# Patient Record
Sex: Female | Born: 1960 | Race: White | Hispanic: No | Marital: Married | Smoking: Never smoker
Health system: Southern US, Community
[De-identification: ages and names within clinical notes are randomized; demographics above are authoritative.]

## PROBLEM LIST (undated history)

## (undated) DIAGNOSIS — R7989 Other specified abnormal findings of blood chemistry: Secondary | ICD-10-CM

## (undated) DIAGNOSIS — R569 Unspecified convulsions: Secondary | ICD-10-CM

## (undated) DIAGNOSIS — R945 Abnormal results of liver function studies: Secondary | ICD-10-CM

## (undated) DIAGNOSIS — F1120 Opioid dependence, uncomplicated: Secondary | ICD-10-CM

## (undated) DIAGNOSIS — F1920 Other psychoactive substance dependence, uncomplicated: Secondary | ICD-10-CM

## (undated) DIAGNOSIS — I10 Essential (primary) hypertension: Secondary | ICD-10-CM

## (undated) DIAGNOSIS — I639 Cerebral infarction, unspecified: Secondary | ICD-10-CM

## (undated) DIAGNOSIS — E785 Hyperlipidemia, unspecified: Secondary | ICD-10-CM

## (undated) HISTORY — DX: Other psychoactive substance dependence, uncomplicated: F19.20

## (undated) HISTORY — DX: Opioid dependence, uncomplicated: F11.20

## (undated) HISTORY — DX: Hyperlipidemia, unspecified: E78.5

## (undated) HISTORY — DX: Essential (primary) hypertension: I10

## (undated) HISTORY — DX: Other specified abnormal findings of blood chemistry: R79.89

## (undated) HISTORY — DX: Abnormal results of liver function studies: R94.5

## (undated) HISTORY — DX: Cerebral infarction, unspecified: I63.9

---

## 1998-12-20 ENCOUNTER — Emergency Department (HOSPITAL_COMMUNITY): Admission: EM | Admit: 1998-12-20 | Discharge: 1998-12-20 | Payer: Self-pay | Admitting: Emergency Medicine

## 2000-10-22 ENCOUNTER — Other Ambulatory Visit: Admission: RE | Admit: 2000-10-22 | Discharge: 2000-10-22 | Payer: Self-pay | Admitting: Obstetrics and Gynecology

## 2000-10-26 ENCOUNTER — Ambulatory Visit (HOSPITAL_COMMUNITY): Admission: RE | Admit: 2000-10-26 | Discharge: 2000-10-26 | Payer: Self-pay | Admitting: Obstetrics and Gynecology

## 2000-10-26 ENCOUNTER — Encounter: Payer: Self-pay | Admitting: Obstetrics and Gynecology

## 2000-12-21 ENCOUNTER — Encounter (INDEPENDENT_AMBULATORY_CARE_PROVIDER_SITE_OTHER): Payer: Self-pay | Admitting: Internal Medicine

## 2000-12-21 ENCOUNTER — Ambulatory Visit (HOSPITAL_COMMUNITY): Admission: RE | Admit: 2000-12-21 | Discharge: 2000-12-21 | Payer: Self-pay | Admitting: Internal Medicine

## 2003-05-20 DIAGNOSIS — I639 Cerebral infarction, unspecified: Secondary | ICD-10-CM

## 2003-05-20 HISTORY — DX: Cerebral infarction, unspecified: I63.9

## 2004-05-29 ENCOUNTER — Emergency Department (HOSPITAL_COMMUNITY): Admission: EM | Admit: 2004-05-29 | Discharge: 2004-05-29 | Payer: Self-pay | Admitting: Emergency Medicine

## 2004-05-29 IMAGING — CT CT HEAD W/O CM
1 series · 16 of 26 positions shown, 20 images · non-contrast
Comparison: None.

CLINICAL DATA: Syncope. Right-sided paresthesias.

HEAD CT WITHOUT CONTRAST
TECHNIQUE: 5mm collimated images were obtained from the base of the skull
through the vertex according to standard protocol without contrast.

[Series 6466: — · axial · 0.45mm/px · z∈[-676,-561]mm · 16 of 26 slices shown, 20 images]
[im 2/26  brain]
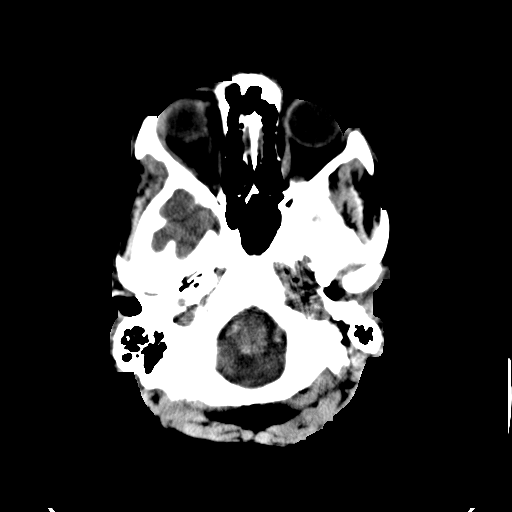
[im 2/26  bone]
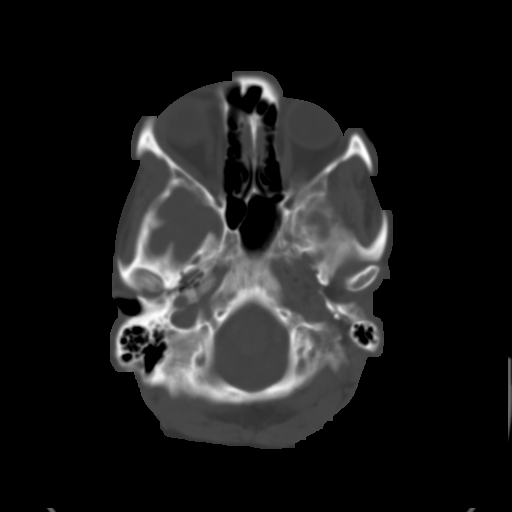
[im 4/26  brain]
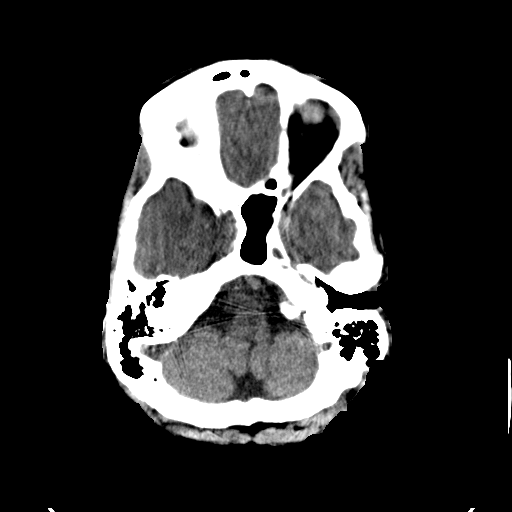
[im 5/26  brain]
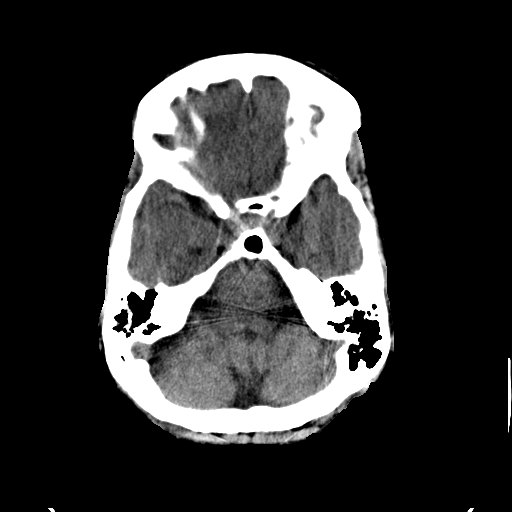
[im 7/26  brain]
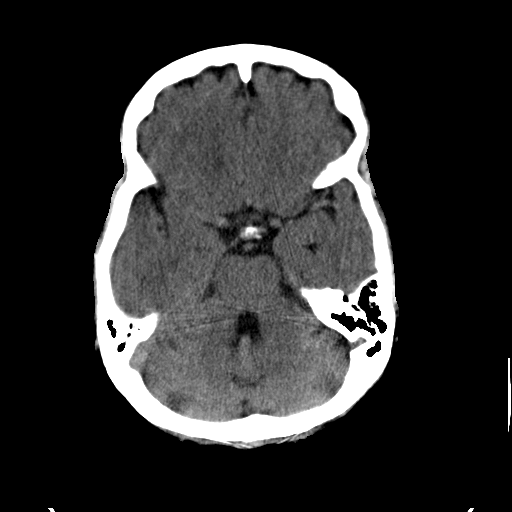
[im 8/26  brain]
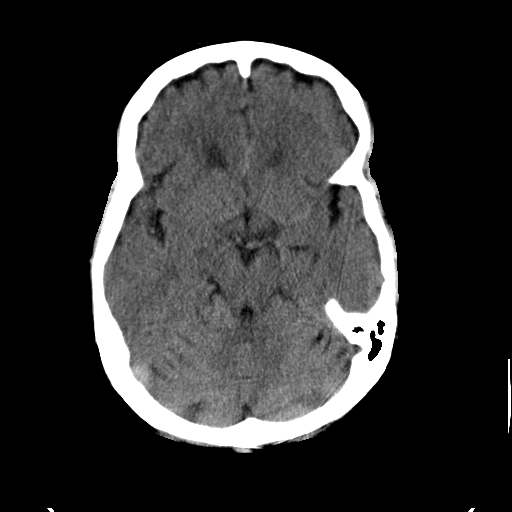
[im 8/26  bone]
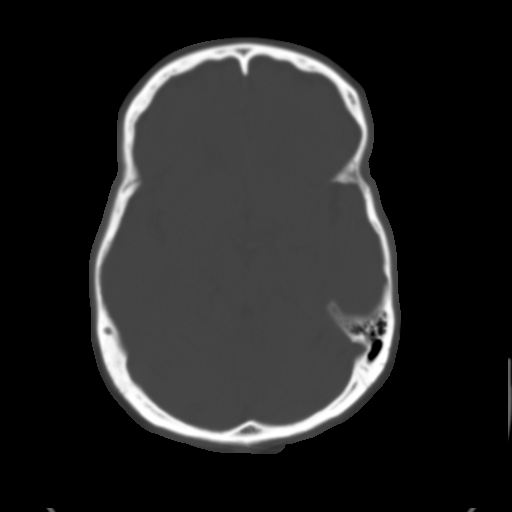
[im 10/26  brain]
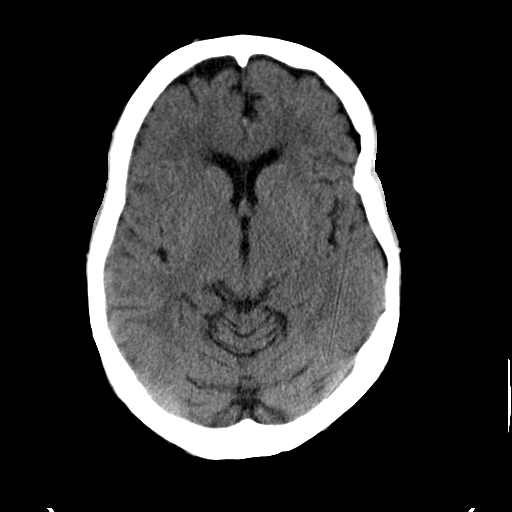
[im 11/26  brain]
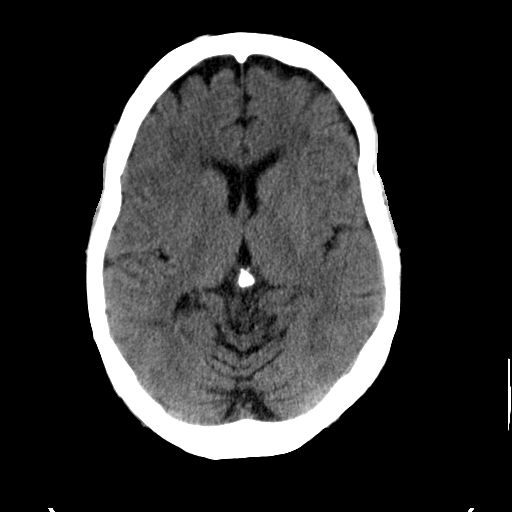
[im 13/26  brain]
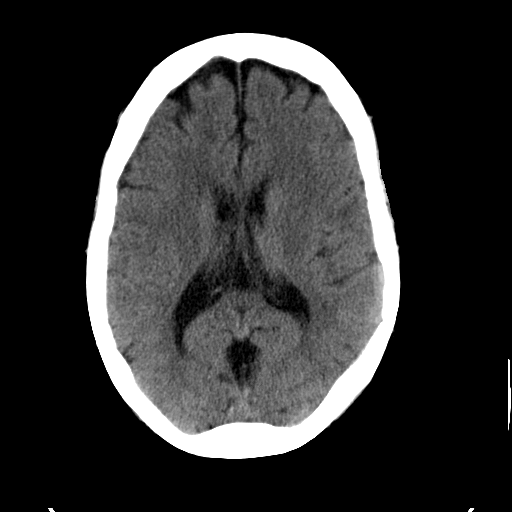
[im 14/26  brain]
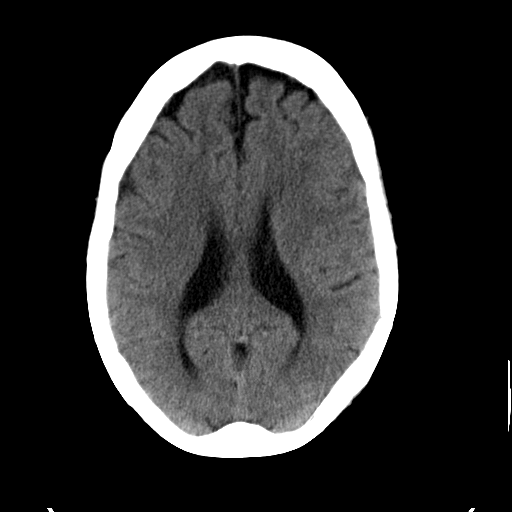
[im 14/26  bone]
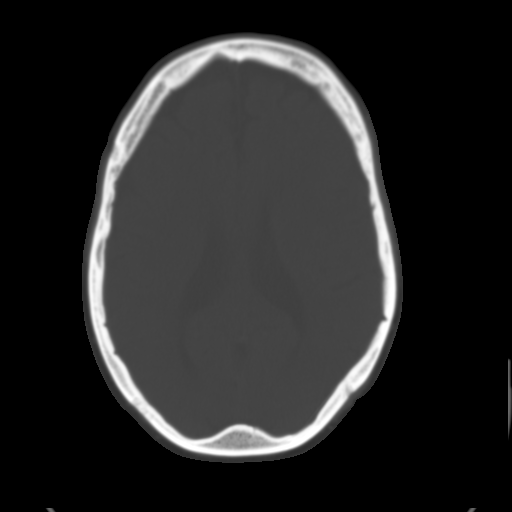
[im 16/26  brain]
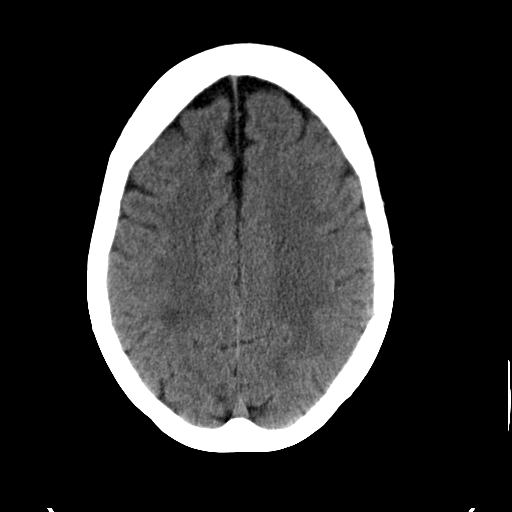
[im 17/26  brain]
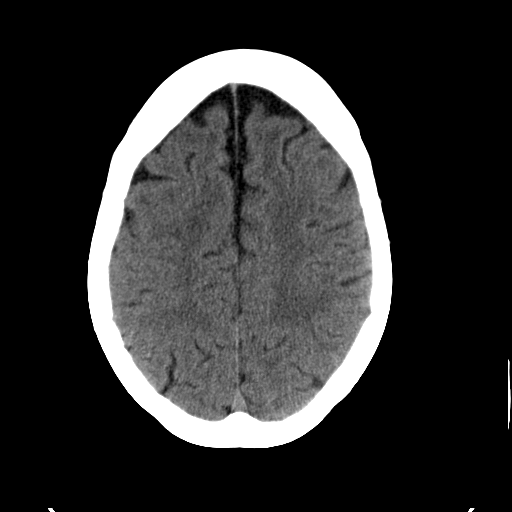
[im 19/26  brain]
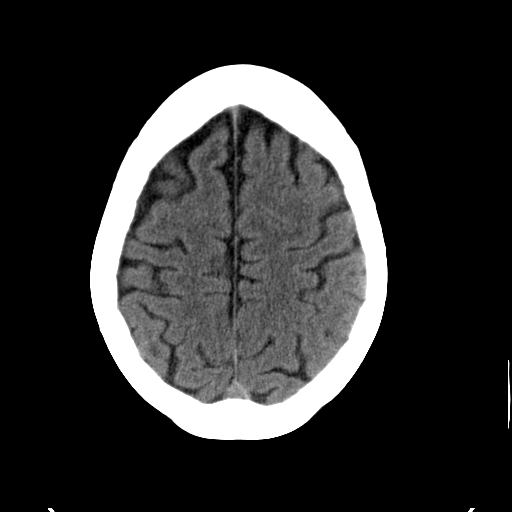
[im 20/26  brain]
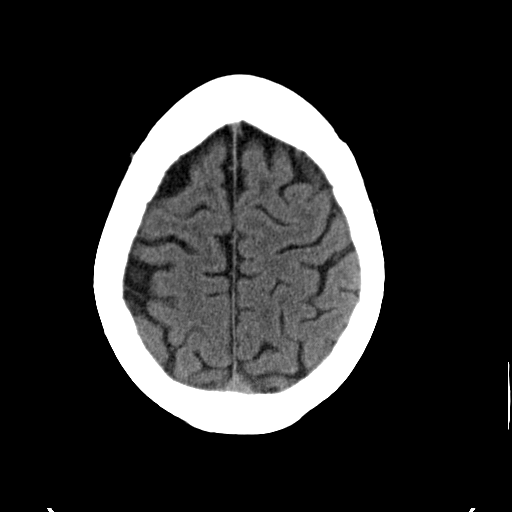
[im 20/26  bone]
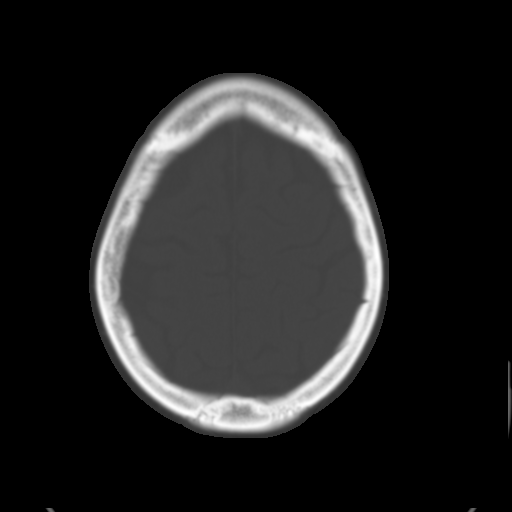
[im 22/26  brain]
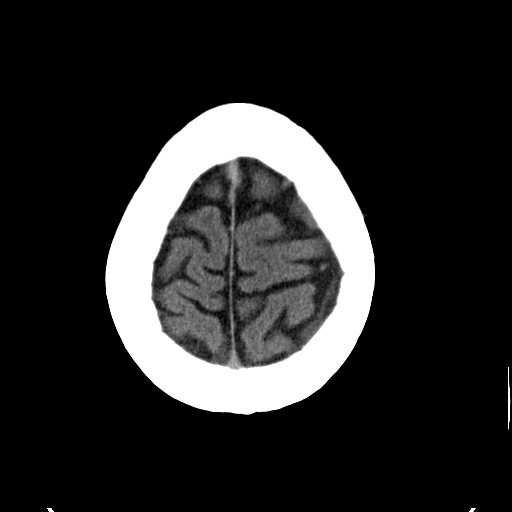
[im 23/26  brain]
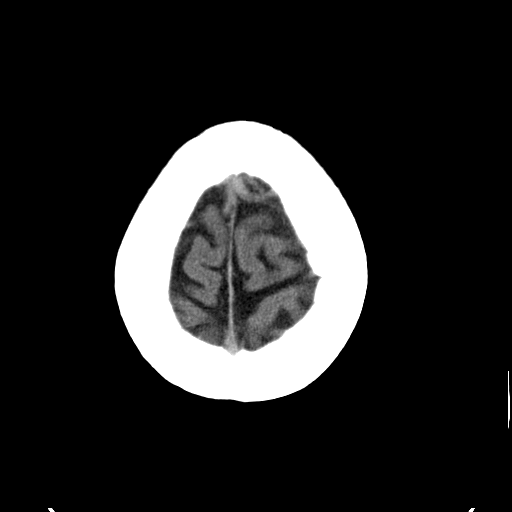
[im 25/26  brain]
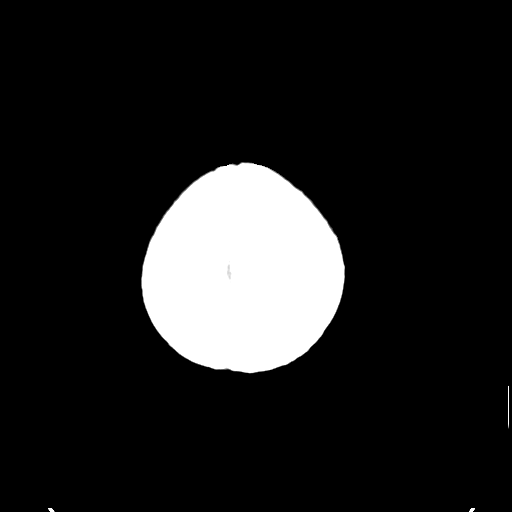

[16 of 26 positions shown; findings below may reference images not displayed]

FINDINGS: Minimally enlarged ventricles and subarachnoid spaces. Small amount
of patchy white matter low density in both cerebral hemispheres, greater on the
right. No intracranial hemorrhage or mass-effect. Unremarkable bones and
included portions of the paranasal sinuses.

IMPRESSION

1. Minimal diffuse cerebral atrophy

2. Mild small vessel white matter ischemic changes in both cerebral hemispheres.

3. No acute abnormality

## 2007-06-03 ENCOUNTER — Other Ambulatory Visit: Admission: RE | Admit: 2007-06-03 | Discharge: 2007-06-03 | Payer: Self-pay | Admitting: Obstetrics and Gynecology

## 2007-07-01 ENCOUNTER — Ambulatory Visit (HOSPITAL_COMMUNITY): Admission: RE | Admit: 2007-07-01 | Discharge: 2007-07-01 | Payer: Self-pay | Admitting: Obstetrics and Gynecology

## 2007-12-21 ENCOUNTER — Ambulatory Visit (HOSPITAL_COMMUNITY): Admission: RE | Admit: 2007-12-21 | Discharge: 2007-12-21 | Payer: Self-pay | Admitting: Obstetrics & Gynecology

## 2007-12-21 IMAGING — US US ABDOMEN COMPLETE
1 series · 14 of 25 positions shown · non-contrast
Comparison: None

CLINICAL DATA: Elevated LFTs, right upper quadrant tenderness,
hepatic enlargement

ABDOMEN ULTRASOUND
TECHNIQUE: Complete abdominal ultrasound examination was performed
including evaluation of the liver, gallbladder, bile ducts,
pancreas, kidneys, spleen, IVC, and abdominal aorta.

[Series 1: unknown · 0.32mm/px · 14 of 61 slices shown]
[im 1/61]
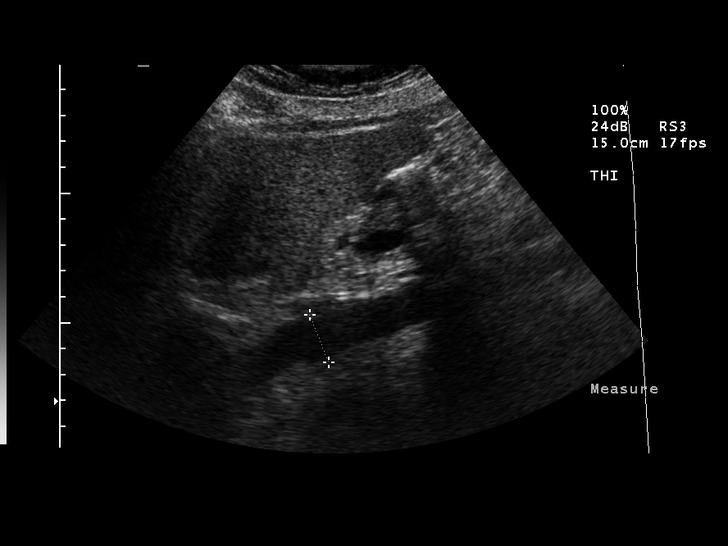
[im 6/61]
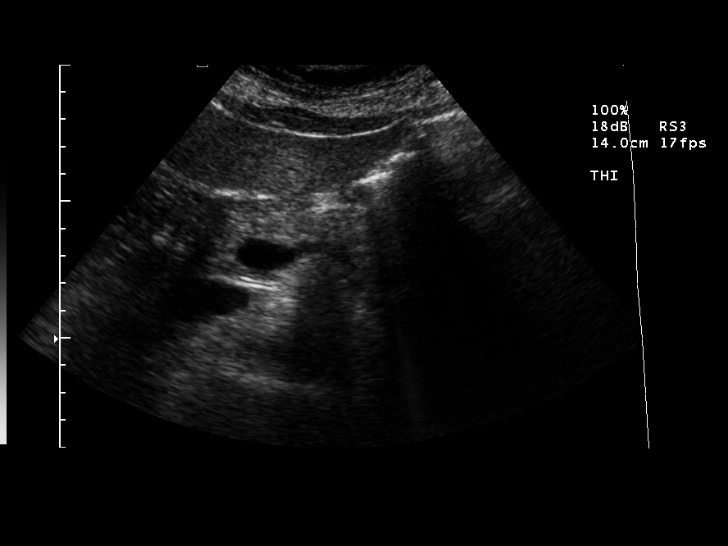
[im 11/61]
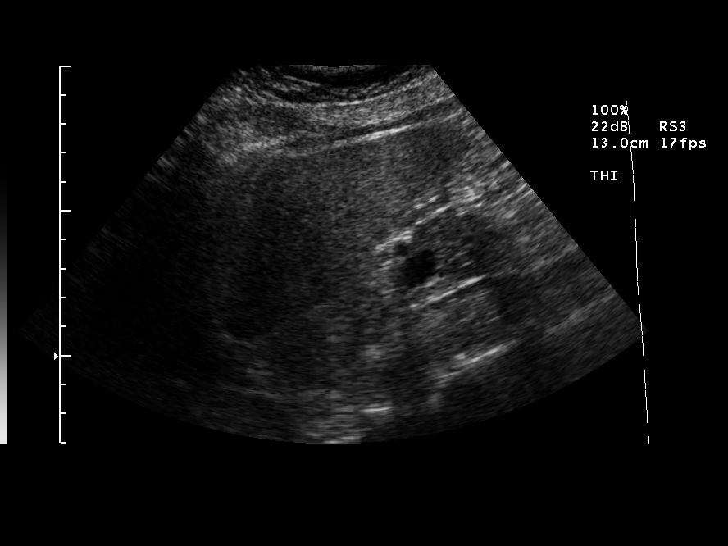
[im 16/61]
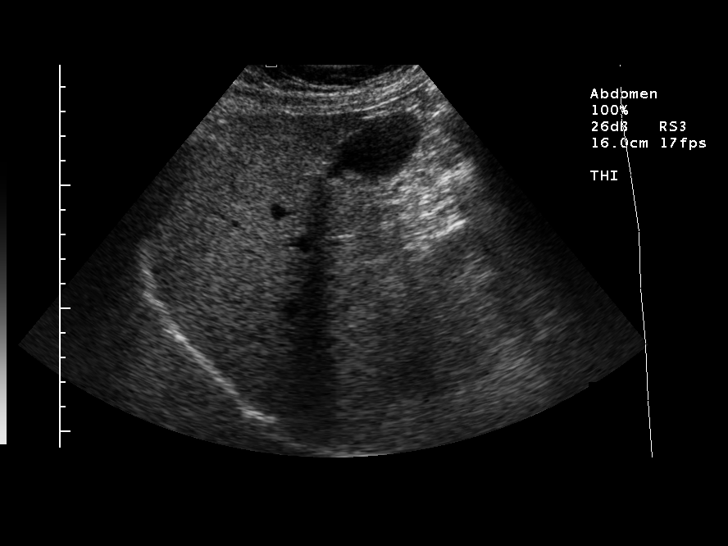
[im 21/61]
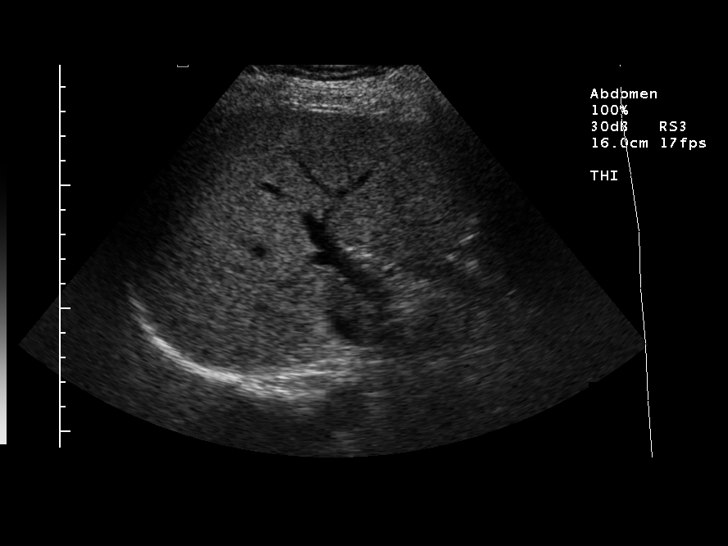
[im 23/61]
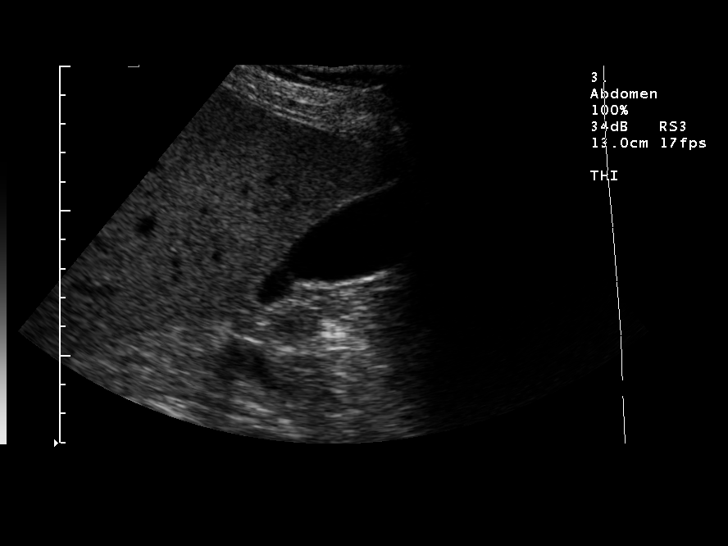
[im 28/61]
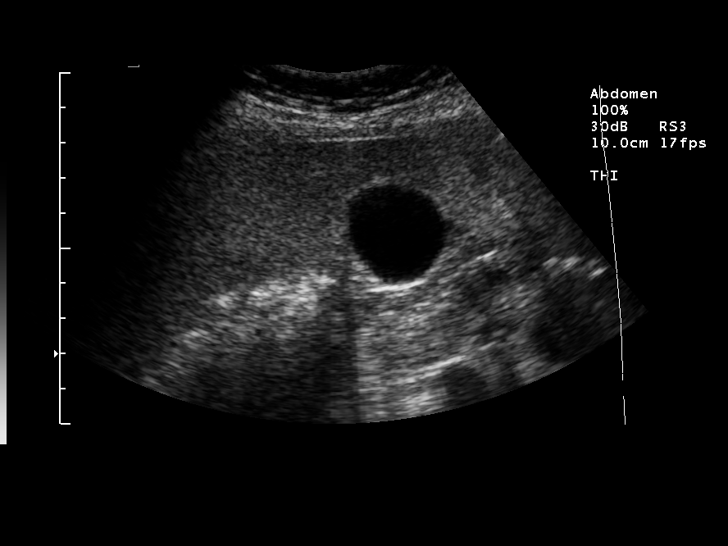
[im 33/61]
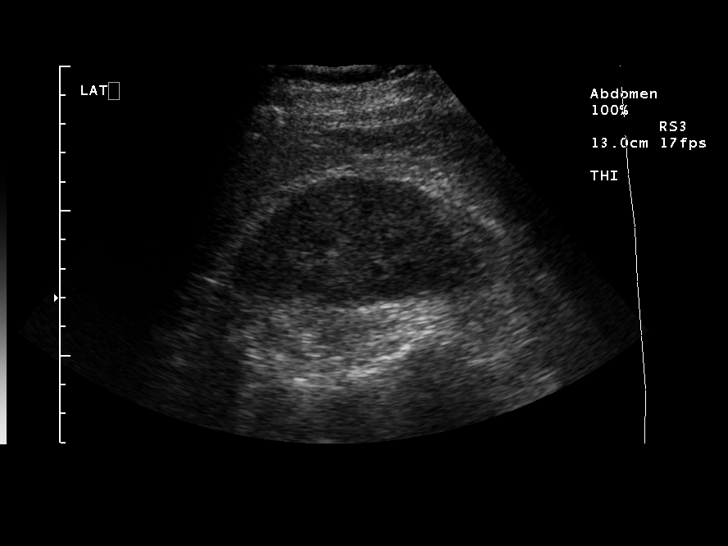
[im 38/61]
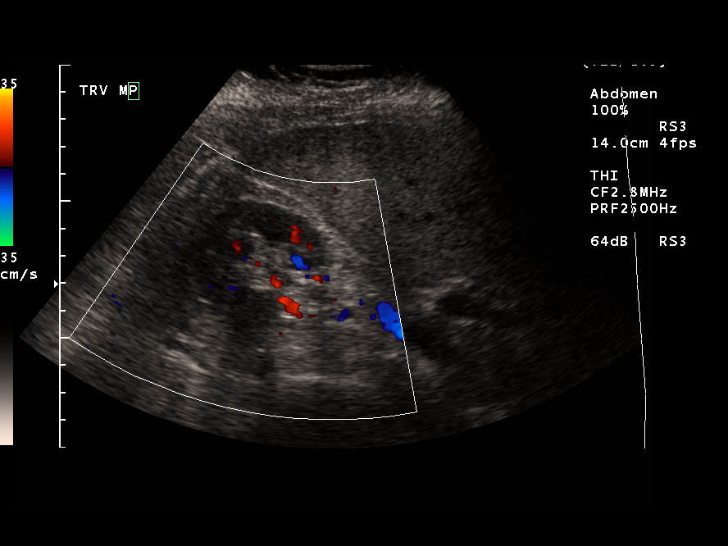
[im 41/61]
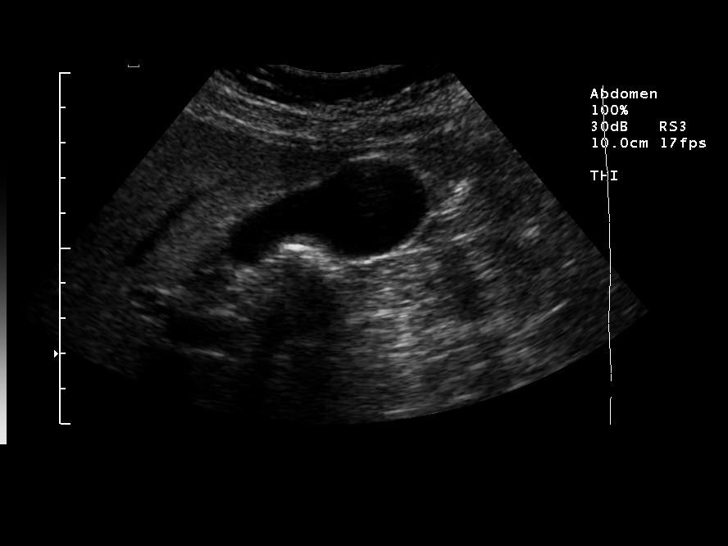
[im 46/61]
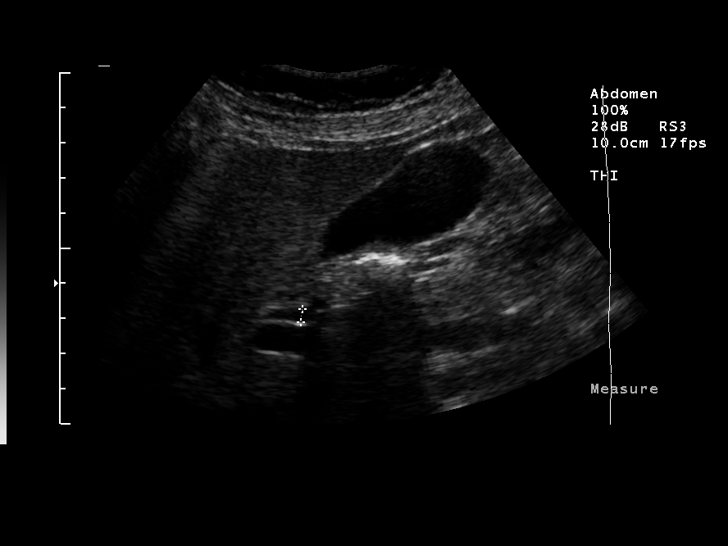
[im 51/61]
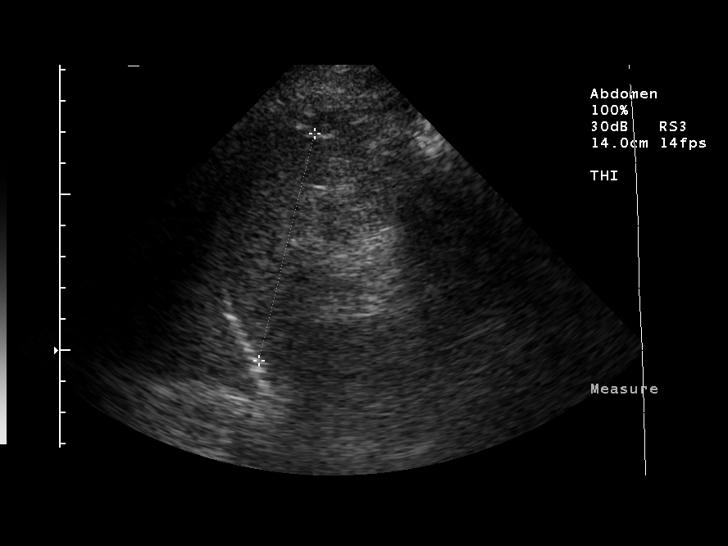
[im 56/61]
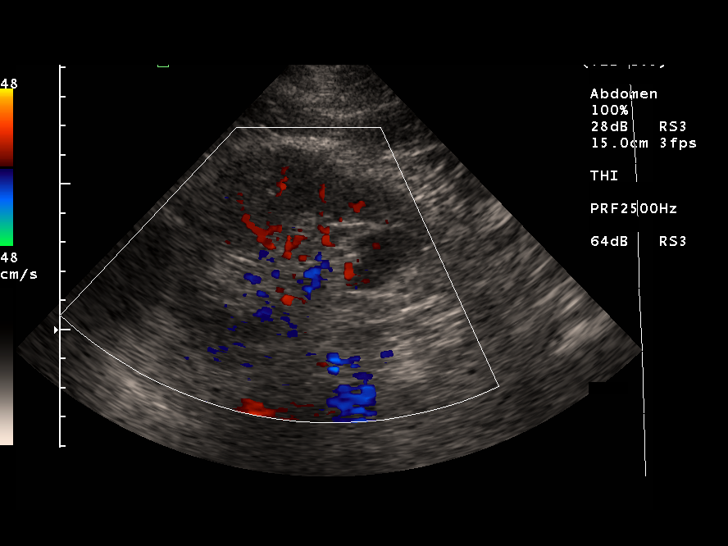
[im 61/61]
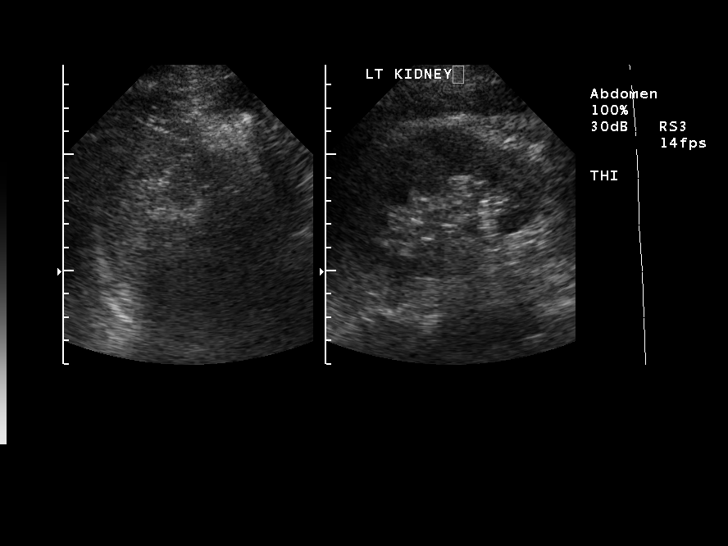

[14 of 25 positions shown; findings below may reference images not displayed]

FINDINGS: Gallbladder normally distended without stones or wall thickening.
No sonographic Murphy sign.
Common bile duct normal caliber, 4 mm diameter.
Minimally echogenic liver, question mild fatty infiltration, though
this can be seen with cirrhosis and certain infiltrative disorders.
No focal hepatic mass or nodularity, nor evidence of hepatic
enlargement.
Pancreas and spleen normal appearance, spleen 8.7 cm length.
Kidneys normal size and morphology, 10.2 cm length right and 9.9 cm
length left.
Aorta and IVC normal.
No free fluid.
IMPRESSION: Probable mild fatty infiltration of liver.
No acute upper abdominal abnormalities.

## 2008-01-06 ENCOUNTER — Ambulatory Visit: Payer: Self-pay | Admitting: Gastroenterology

## 2008-06-12 ENCOUNTER — Other Ambulatory Visit: Admission: RE | Admit: 2008-06-12 | Discharge: 2008-06-12 | Payer: Self-pay | Admitting: Obstetrics and Gynecology

## 2008-06-22 ENCOUNTER — Ambulatory Visit: Payer: Self-pay | Admitting: Orthopedic Surgery

## 2008-06-22 DIAGNOSIS — M4800 Spinal stenosis, site unspecified: Secondary | ICD-10-CM

## 2008-06-22 DIAGNOSIS — M542 Cervicalgia: Secondary | ICD-10-CM

## 2008-06-22 DIAGNOSIS — M25519 Pain in unspecified shoulder: Secondary | ICD-10-CM | POA: Insufficient documentation

## 2008-06-22 DIAGNOSIS — S139XXA Sprain of joints and ligaments of unspecified parts of neck, initial encounter: Secondary | ICD-10-CM

## 2008-06-22 DIAGNOSIS — M479 Spondylosis, unspecified: Secondary | ICD-10-CM | POA: Insufficient documentation

## 2008-06-30 ENCOUNTER — Telehealth: Payer: Self-pay | Admitting: Orthopedic Surgery

## 2008-07-03 DIAGNOSIS — E7800 Pure hypercholesterolemia, unspecified: Secondary | ICD-10-CM | POA: Insufficient documentation

## 2008-07-03 DIAGNOSIS — I10 Essential (primary) hypertension: Secondary | ICD-10-CM | POA: Insufficient documentation

## 2008-07-03 DIAGNOSIS — I6350 Cerebral infarction due to unspecified occlusion or stenosis of unspecified cerebral artery: Secondary | ICD-10-CM | POA: Insufficient documentation

## 2008-07-03 DIAGNOSIS — R1310 Dysphagia, unspecified: Secondary | ICD-10-CM | POA: Insufficient documentation

## 2008-07-03 DIAGNOSIS — F1010 Alcohol abuse, uncomplicated: Secondary | ICD-10-CM | POA: Insufficient documentation

## 2008-07-03 DIAGNOSIS — R7309 Other abnormal glucose: Secondary | ICD-10-CM | POA: Insufficient documentation

## 2008-07-04 ENCOUNTER — Ambulatory Visit: Payer: Self-pay | Admitting: Gastroenterology

## 2008-07-05 ENCOUNTER — Telehealth: Payer: Self-pay | Admitting: Orthopedic Surgery

## 2008-07-18 ENCOUNTER — Telehealth: Payer: Self-pay | Admitting: Orthopedic Surgery

## 2008-07-20 ENCOUNTER — Encounter: Payer: Self-pay | Admitting: Orthopedic Surgery

## 2008-07-20 ENCOUNTER — Telehealth: Payer: Self-pay | Admitting: Orthopedic Surgery

## 2008-07-24 ENCOUNTER — Ambulatory Visit (HOSPITAL_COMMUNITY): Admission: RE | Admit: 2008-07-24 | Discharge: 2008-07-24 | Payer: Self-pay | Admitting: Obstetrics and Gynecology

## 2008-08-01 ENCOUNTER — Telehealth: Payer: Self-pay | Admitting: Orthopedic Surgery

## 2008-08-02 ENCOUNTER — Ambulatory Visit (HOSPITAL_COMMUNITY): Admission: RE | Admit: 2008-08-02 | Discharge: 2008-08-02 | Payer: Self-pay | Admitting: Orthopedic Surgery

## 2008-08-02 IMAGING — CT CT CERVICAL SPINE W/O CM
3 of 4 series · 13 of 33 positions shown, 16 images · non-contrast
Comparison: None

CLINICAL DATA: Neck pain, spinal stenosis

CT CERVICAL SPINE WITHOUT CONTRAST
TECHNIQUE: Multidetector CT imaging of the cervical spine was
performed. Multiplanar CT image reconstructions were also
generated.

[Series 2: cervical 2.0 b31s · axial · 0.26mm/px · z∈[+124,+232]mm · 5 of 110 slices shown, 7 images]
[im 19/110  soft-tissue]
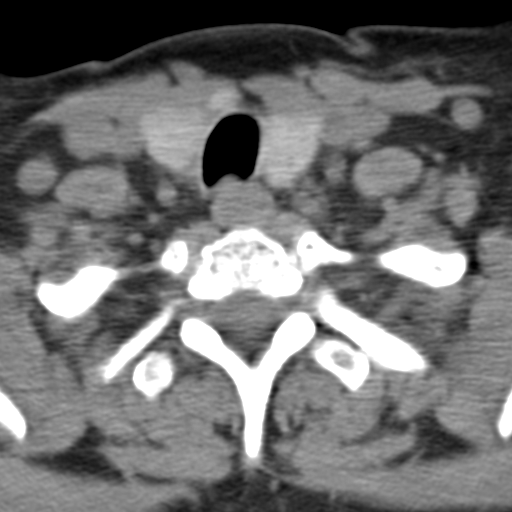
[im 19/110  bone]
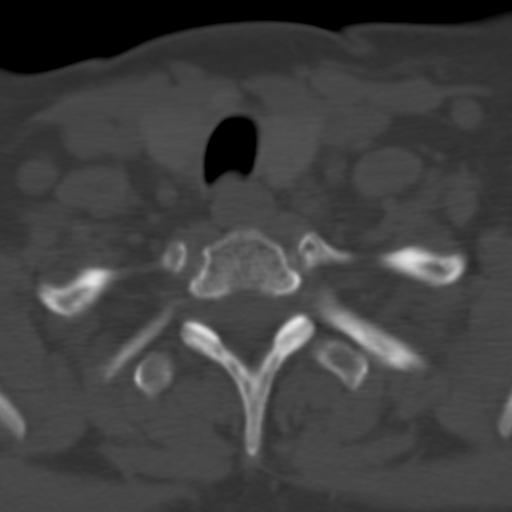
[im 37/110  bone]
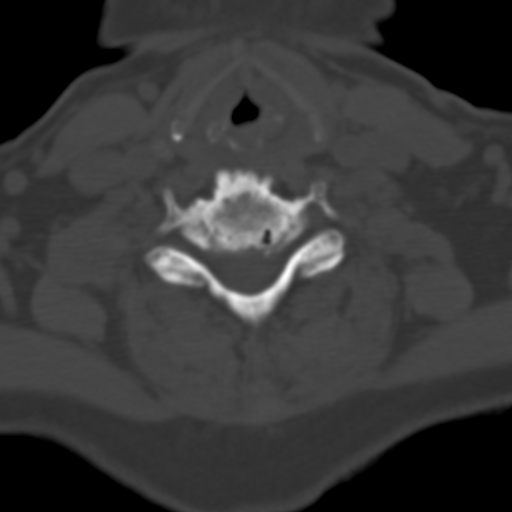
[im 55/110  bone]
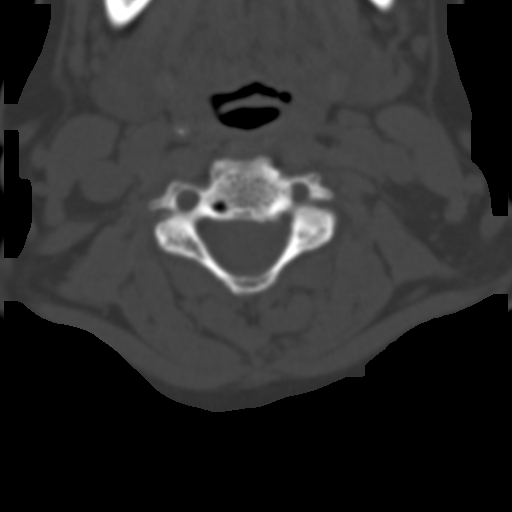
[im 73/110  bone]
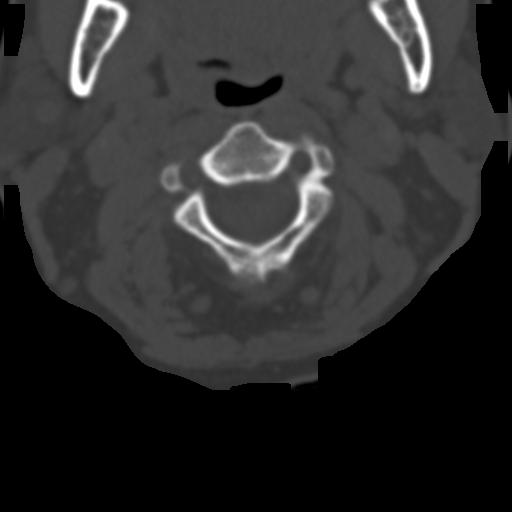
[im 91/110  soft-tissue]
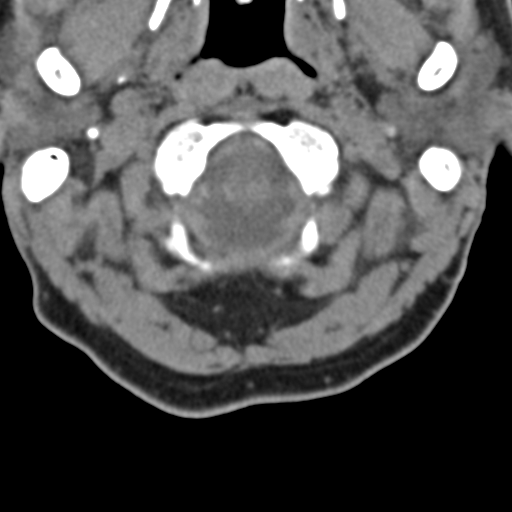
[im 91/110  bone]
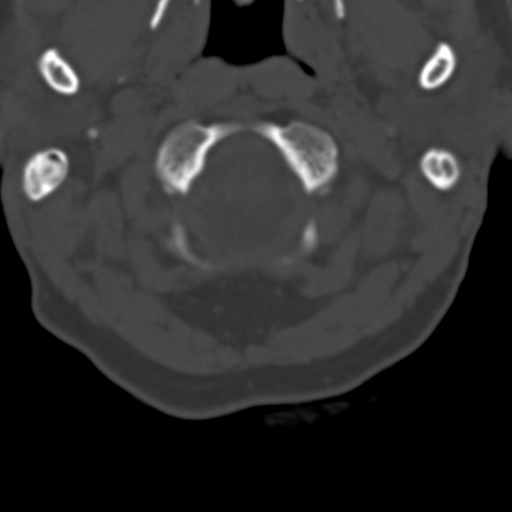

[Series 4: cervical 2.0 spo cor · coronal · 0.25mm/px · 3 of 68 slices shown]
[im 14/68  bone]
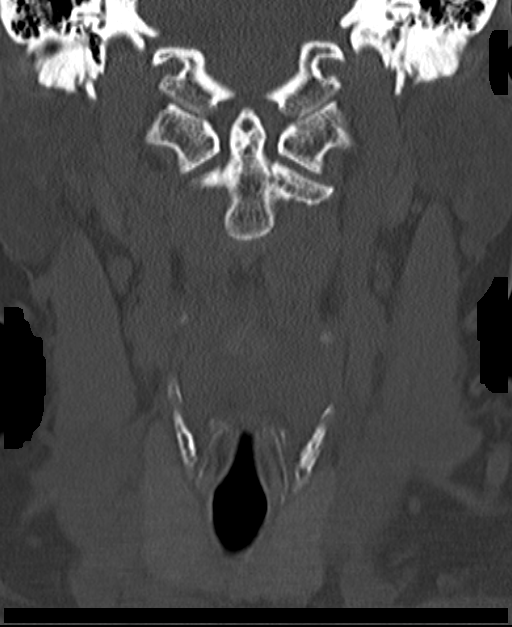
[im 27/68  bone]
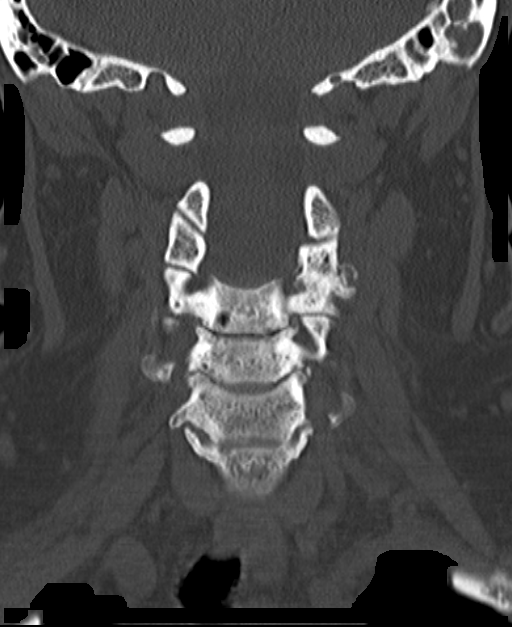
[im 41/68  bone]
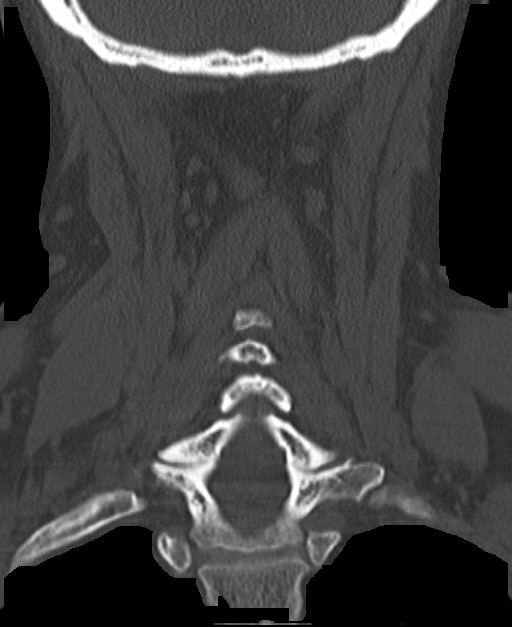

[Series 5: cervical 2.0 spo sag · sagittal · 0.26mm/px · 5 of 55 slices shown, 6 images]
[im 19/55  bone]
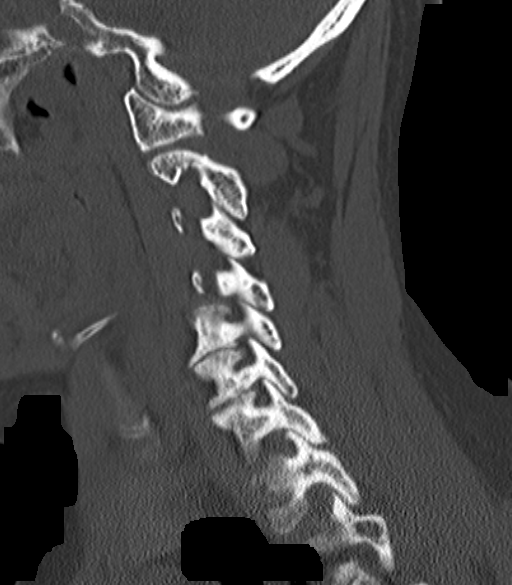
[im 23/55  bone]
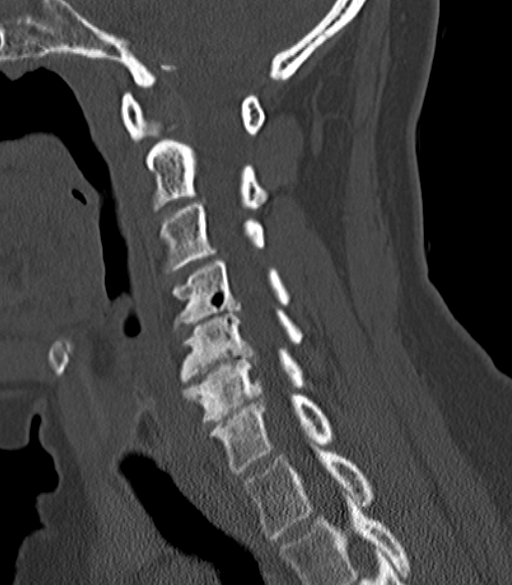
[im 28/55  soft-tissue]
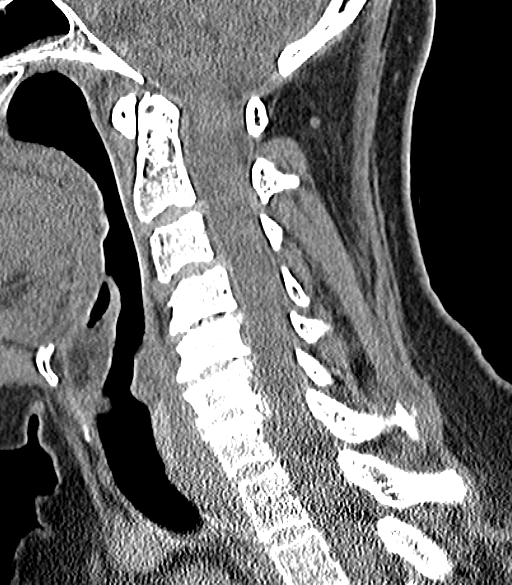
[im 28/55  bone]
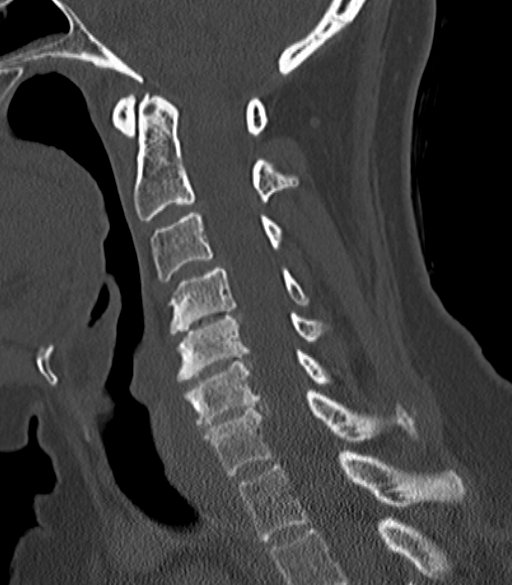
[im 32/55  bone]
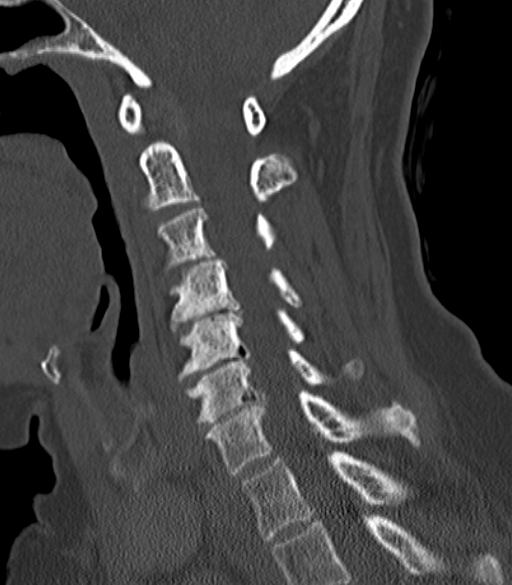
[im 37/55  bone]
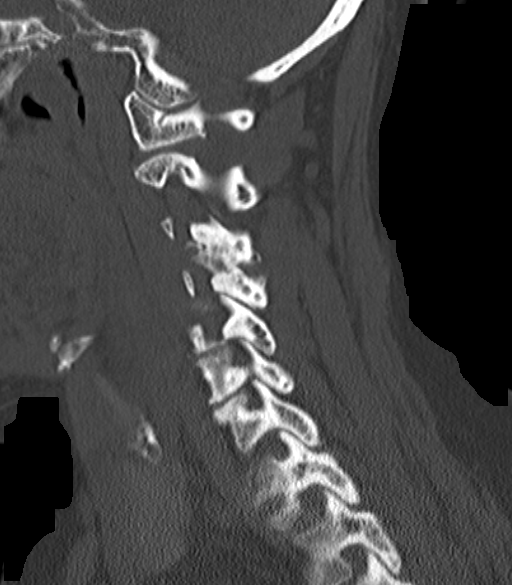

[13 of 33 positions shown; findings below may reference images not displayed]

FINDINGS: Normal alignment.  No prevertebral soft tissue swelling.

C2-3:  Small central bulge.  No foraminal or central canal
stenosis.

C3-4:  Moderately advanced left facet degenerative hypertrophy and
spurring resulting in early left-sided foraminal encroachment.  The
interspace is unremarkable.

C4-5:  Moderate narrowing of the interspace.  Discogenic sclerosis
in the adjacent vertebral bodies.  Vacuum phenomenon in the
interspace extending into the C4 vertebral body on the right.  Mild
circumferential disc bulge with associated endplate spurring.  This
results in asymmetric neural foraminal encroachment, left greater
than right.  No central canal stenosis.

C5-6:  Narrowing of the interspace with circumferential disc bulge
and associated endplate spurring.  Uncovertebral hypertrophy
results in neural foraminal narrowing, right greater than left.
There is mild narrowing of the central canal and there may be some
flattening of the anterior aspect of the cord, evaluation limited
without intrathecal contrast.

C6-7:  Moderate narrowing of the interspace with early vacuum
phenomenon.  Circumferential disc bulge with associated endplate
spurs.  There may be a superimposed  central   noncalcified
protrusion.  The effect  upon the cervical cord is not evaluated.
Mild bilateral uncovertebral spurring results in early foraminal
encroachment, left greater than right.  No definite central canal
stenosis.

C7-C1:  The interspace is unremarkable.  There is early facet
degenerative spurring, right greater than left, without neural
foraminal or central canal stenosis.
IMPRESSION: 1.  Left facet degenerative hypertrophy C3-4 resulting in foraminal
stenosis.
2.  Degenerative disc and uncovertebral disease C4-5, C5-6, and C6-
7 as enumerated above.

## 2008-08-07 ENCOUNTER — Ambulatory Visit: Payer: Self-pay | Admitting: Orthopedic Surgery

## 2008-08-09 ENCOUNTER — Encounter (HOSPITAL_COMMUNITY): Admission: RE | Admit: 2008-08-09 | Discharge: 2008-09-08 | Payer: Self-pay | Admitting: Orthopedic Surgery

## 2008-08-09 ENCOUNTER — Encounter: Payer: Self-pay | Admitting: Gastroenterology

## 2008-08-09 ENCOUNTER — Encounter: Payer: Self-pay | Admitting: Orthopedic Surgery

## 2008-08-10 LAB — CONVERTED CEMR LAB
Alkaline Phosphatase: 79 units/L (ref 39–117)
Bilirubin, Direct: 0.1 mg/dL (ref 0.0–0.3)
Indirect Bilirubin: 0.5 mg/dL (ref 0.0–0.9)
Total Bilirubin: 0.6 mg/dL (ref 0.3–1.2)

## 2008-08-11 ENCOUNTER — Encounter (INDEPENDENT_AMBULATORY_CARE_PROVIDER_SITE_OTHER): Payer: Self-pay | Admitting: *Deleted

## 2008-08-14 ENCOUNTER — Telehealth: Payer: Self-pay | Admitting: Orthopedic Surgery

## 2008-09-27 ENCOUNTER — Telehealth: Payer: Self-pay | Admitting: Orthopedic Surgery

## 2008-11-07 ENCOUNTER — Encounter: Payer: Self-pay | Admitting: Orthopedic Surgery

## 2008-12-20 ENCOUNTER — Encounter: Payer: Self-pay | Admitting: Gastroenterology

## 2009-09-21 ENCOUNTER — Other Ambulatory Visit: Admission: RE | Admit: 2009-09-21 | Discharge: 2009-09-21 | Payer: Self-pay | Admitting: Obstetrics and Gynecology

## 2009-10-23 ENCOUNTER — Ambulatory Visit: Payer: Self-pay | Admitting: Gastroenterology

## 2009-10-23 DIAGNOSIS — R740 Nonspecific elevation of levels of transaminase and lactic acid dehydrogenase [LDH]: Secondary | ICD-10-CM

## 2009-10-23 DIAGNOSIS — R7401 Elevation of levels of liver transaminase levels: Secondary | ICD-10-CM | POA: Insufficient documentation

## 2009-10-24 ENCOUNTER — Encounter: Payer: Self-pay | Admitting: Gastroenterology

## 2009-10-29 ENCOUNTER — Ambulatory Visit (HOSPITAL_COMMUNITY): Admission: RE | Admit: 2009-10-29 | Discharge: 2009-10-29 | Payer: Self-pay | Admitting: Gastroenterology

## 2009-10-29 IMAGING — CT CT ABD-PELV W/ CM
2 of 5 series · 15 of 46 positions shown, 17 images · IV contrast (Omnipaque 300)
Comparison: None

CLINICAL DATA: Elevated LFTs, right upper quadrant pain,
hypertension

CT ABDOMEN AND PELVIS WITH CONTRAST
TECHNIQUE: Multidetector CT imaging of the abdomen and pelvis was
performed following the standard protocol during bolus
administration of intravenous contrast. Breast shield utilized.
Sagittal and coronal MPR images reconstructed from axial data set.
Contrast: 100 ml [XY] IV; oral contrast not utilized

[Series 3: (person_name) thins (id) · axial · 0.59mm/px · z∈[-503,-101]mm · 12 of 298 slices shown, 14 images]
[im 15/298  soft-tissue]
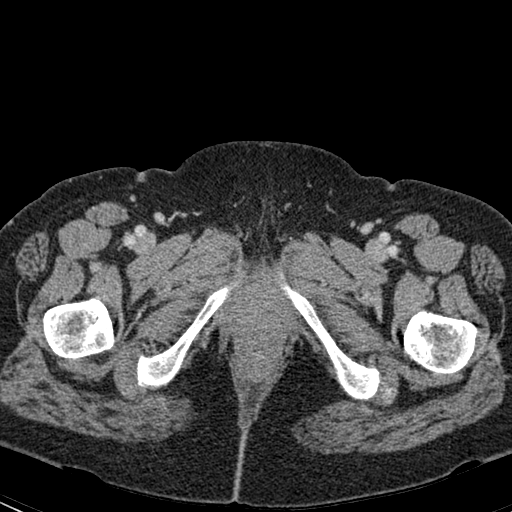
[im 15/298  bone]
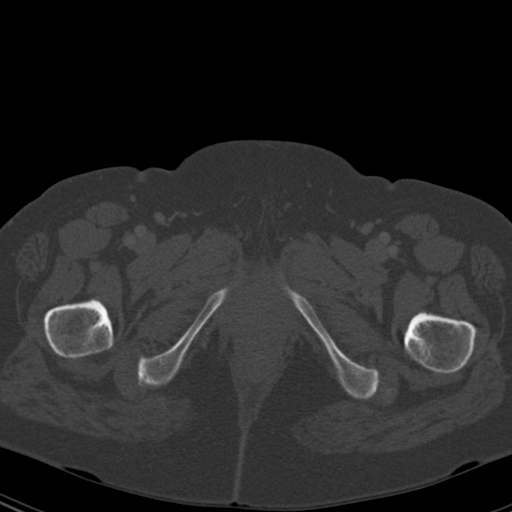
[im 43/298  soft-tissue]
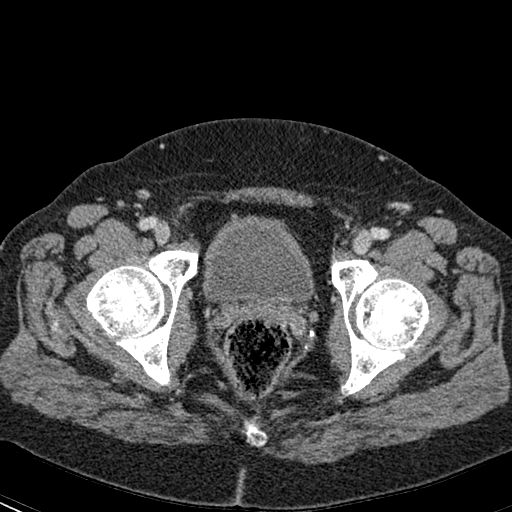
[im 71/298  soft-tissue]
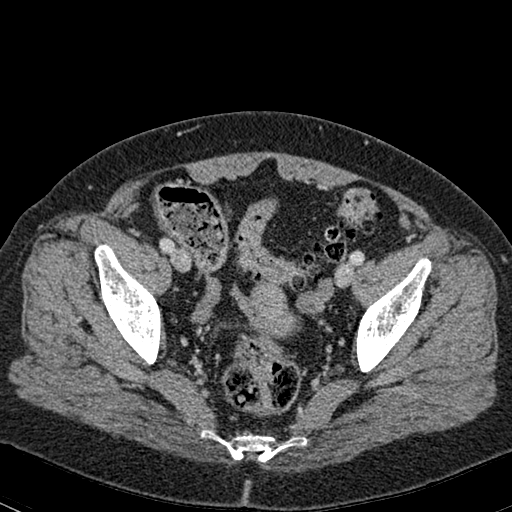
[im 85/298  soft-tissue]
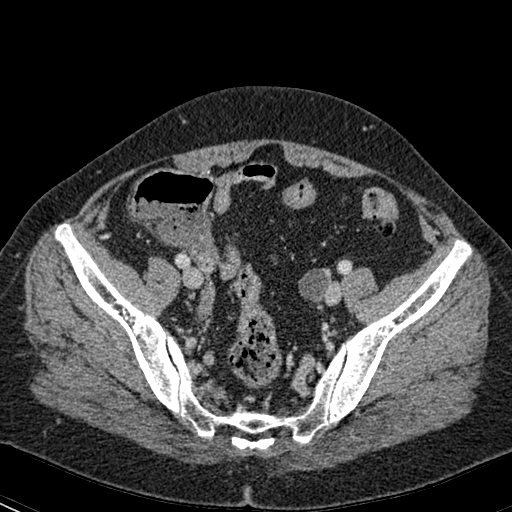
[im 114/298  soft-tissue]
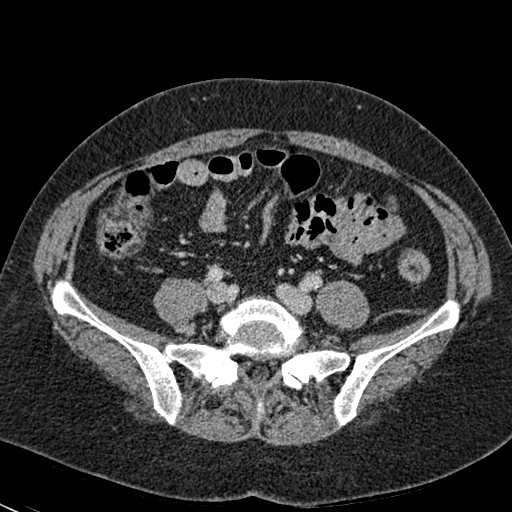
[im 142/298  soft-tissue]
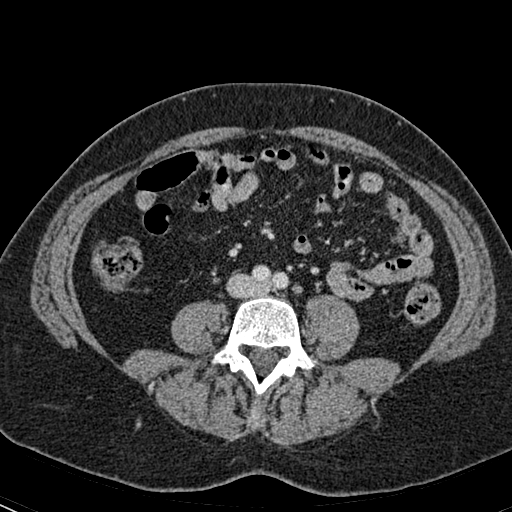
[im 156/298  soft-tissue]
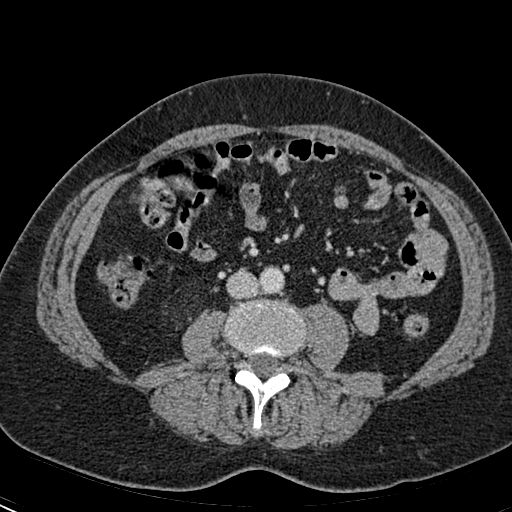
[im 184/298  soft-tissue]
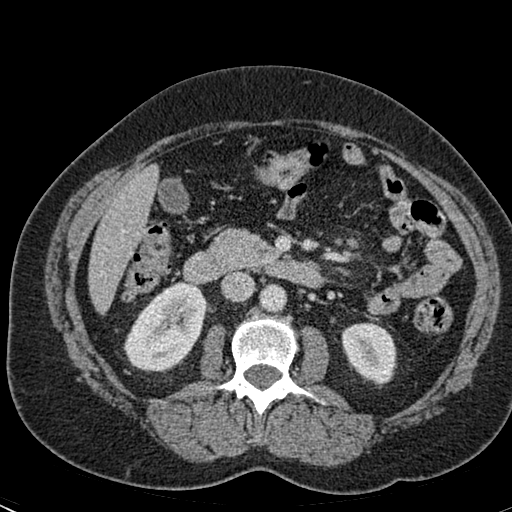
[im 213/298  soft-tissue]
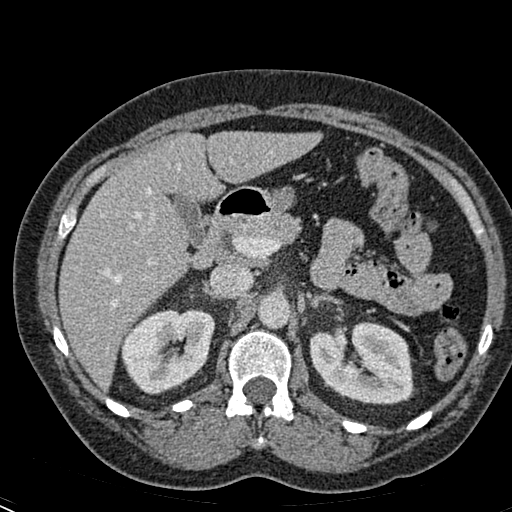
[im 213/298  bone]
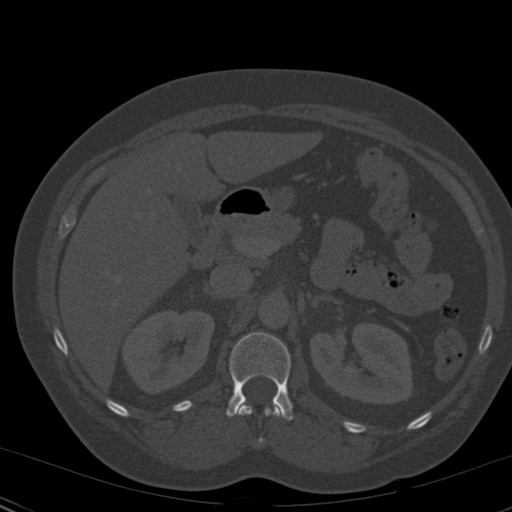
[im 227/298  soft-tissue]
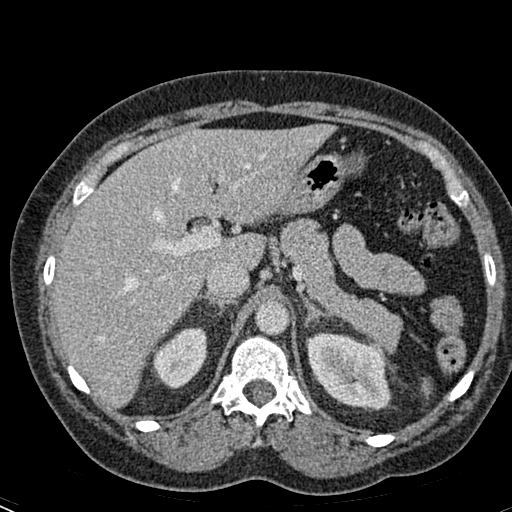
[im 255/298  soft-tissue]
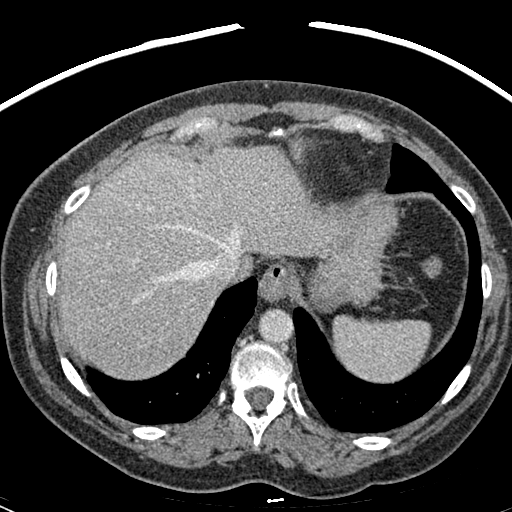
[im 283/298  soft-tissue]
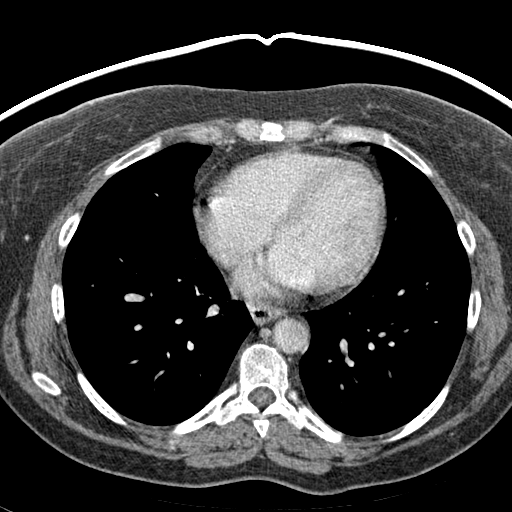

[Series 8: abd_pel_with 3.0 spo cor · coronal · 0.59mm/px · 3 of 71 slices shown]
[im 24/71  soft-tissue]
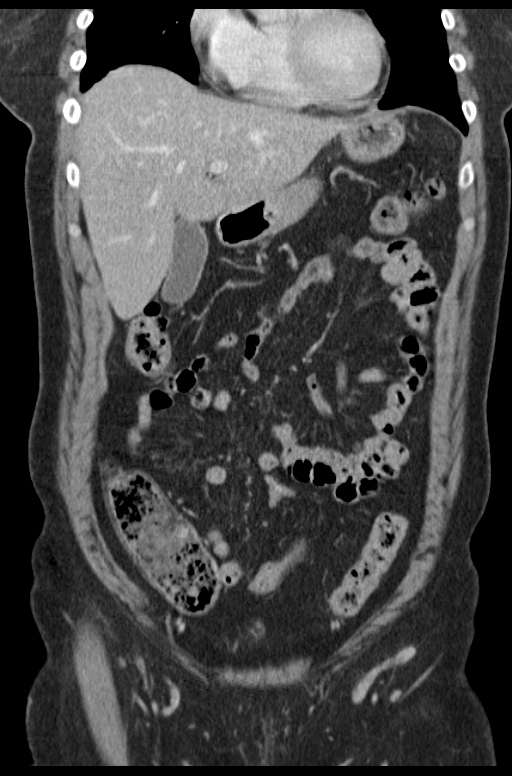
[im 32/71  soft-tissue]
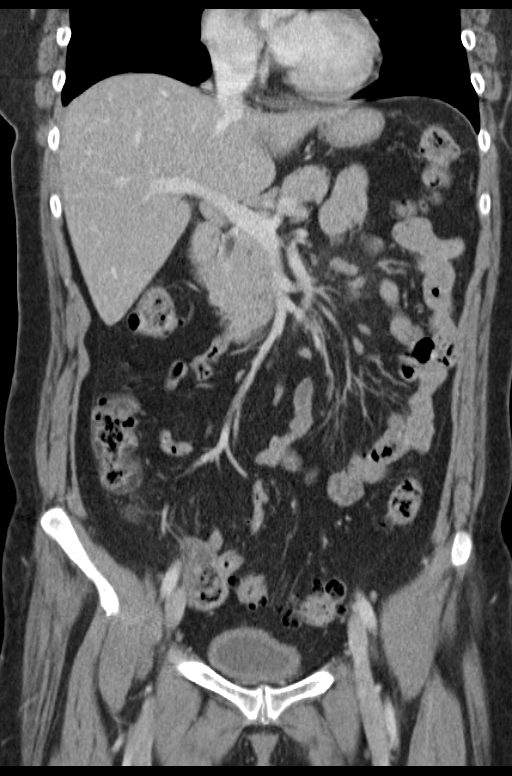
[im 39/71  soft-tissue]
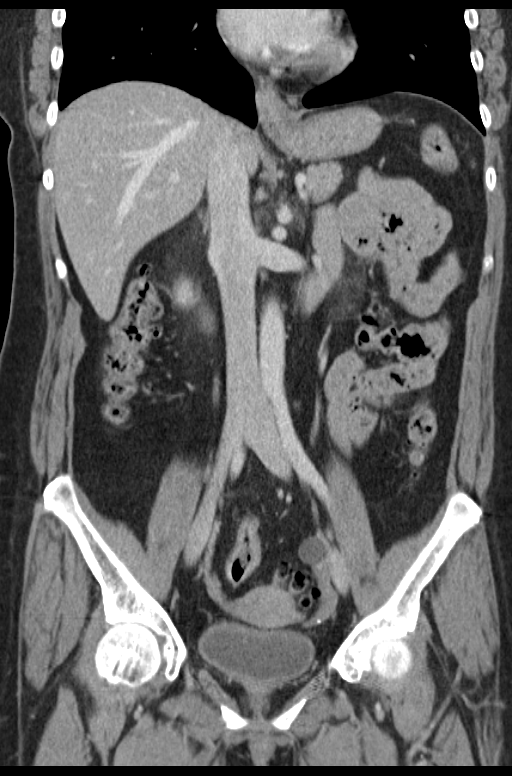

[15 of 46 positions shown; findings below may reference images not displayed]

FINDINGS: Minimal atelectasis right lung base.
Question thickening of distal esophageal wall versus collapsed
hiatal hernia.
Liver, spleen, pancreas, kidneys, and adrenal glands normal
appearance.
Gallbladder unremarkable by CT.
Scattered colonic diverticulosis without definite diverticulitis
changes.
Tiny bilateral ovarian cysts.
Unremarkable uterus.
Minimal bladder wall thickening though this may be an artifact from
underdistension.
Normal appendix.

Stomach and small bowel loops unremarkable.
Minimal nonspecific hazy density within small bowel mesentery in
left mid abdomen, nonspecific but can be seen with fibrosing
mesenteritis.
Scattered normal-sized mesenteric lymph nodes.
Few scattered pelvic phleboliths.
No mass, adenopathy, free fluid or inflammatory process.
No acute bony findings.
IMPRESSION: Question esophageal wall thickening distal esophagus versus
collapsed hiatal hernia.
Colonic diverticulosis.
Stranding of small bowel mesentery in left mid abdomen, with few
normal-sized mesenteric lymph nodes, raising question of fibrosing
mesenteritis.
No no additional significant intra abdominal findings.

## 2009-10-30 ENCOUNTER — Telehealth (INDEPENDENT_AMBULATORY_CARE_PROVIDER_SITE_OTHER): Payer: Self-pay

## 2009-10-31 ENCOUNTER — Telehealth (INDEPENDENT_AMBULATORY_CARE_PROVIDER_SITE_OTHER): Payer: Self-pay

## 2009-11-01 ENCOUNTER — Encounter: Payer: Self-pay | Admitting: Gastroenterology

## 2009-11-05 ENCOUNTER — Encounter: Payer: Self-pay | Admitting: Internal Medicine

## 2009-11-16 ENCOUNTER — Encounter (INDEPENDENT_AMBULATORY_CARE_PROVIDER_SITE_OTHER): Payer: Self-pay

## 2009-12-31 ENCOUNTER — Encounter: Payer: Self-pay | Admitting: Gastroenterology

## 2010-06-08 ENCOUNTER — Encounter: Payer: Self-pay | Admitting: Obstetrics and Gynecology

## 2010-06-09 ENCOUNTER — Encounter: Payer: Self-pay | Admitting: Obstetrics and Gynecology

## 2010-06-09 ENCOUNTER — Encounter: Payer: Self-pay | Admitting: Orthopedic Surgery

## 2010-06-18 NOTE — Letter (Signed)
Summary: SURGICAL REFERRAL  SURGICAL REFERRAL   Imported By: Ave Filter 11/01/2009 15:00:20  _____________________________________________________________________  External Attachment:    Type:   Image     Comment:   External Document  Appended Document: SURGICAL REFERRAL Pt has an appt. 11/13/09@9 :45a.m. and per Dr Lovell Sheehan office she must bring a 100.00 to the first visit.  Appended Document: SURGICAL REFERRAL Pt aware of appt.

## 2010-06-18 NOTE — Progress Notes (Signed)
Summary: pt confirmed pre-op appt  Phone Note Call from Patient   Caller: Patient Summary of Call: Pt called to confirm her appt for pre-op, i could not find it in her instructions, so i called Selena Batten and confirmed the appt time she had was correct for 11/05/2009 @1 :15. She is aware now. Initial call taken by: Cloria Spring LPN,  October 30, 2009 2:46 PM

## 2010-06-18 NOTE — Letter (Signed)
Summary: labreport-griffin,np  labreport-griffin,np   Imported By: Rosine Beat 11/05/2009 13:08:49  _____________________________________________________________________  External Attachment:    Type:   Image     Comment:   External Document

## 2010-06-18 NOTE — Letter (Signed)
Summary: REFERRAL TO DR Lovell Sheehan [facility]  REFERRAL TO DR Lovell Sheehan [facility]   Imported By: Rexene Alberts 12/31/2009 14:08:37  _____________________________________________________________________  External Attachment:    Type:   Image     Comment:   External Document

## 2010-06-18 NOTE — Progress Notes (Signed)
Summary: PT CANCELLED APPT FOR PROCEDURE   Phone Note Call from Patient   Caller: Patient Summary of Call: Pt called and said she wants to cancel her appt for procedure on 11/08/2009, she is just not ready for it yet. She will call when she decides to do it. Kim in Endo was informed. (Pre-op cancellation also) Initial call taken by: Cloria Spring LPN,  October 31, 2009 10:52 AM

## 2010-06-18 NOTE — Letter (Signed)
Summary: CT SCAN OF THE ABD ORDER  CT SCAN OF THE ABD ORDER   Imported By: Ave Filter 10/24/2009 11:38:58  _____________________________________________________________________  External Attachment:    Type:   Image     Comment:   External Document

## 2010-06-18 NOTE — Letter (Signed)
Summary: TCS ORDER  TCS ORDER   Imported By: Ave Filter 10/23/2009 16:46:40  _____________________________________________________________________  External Attachment:    Type:   Image     Comment:   External Document

## 2010-06-18 NOTE — Assessment & Plan Note (Signed)
Summary: ELEVATED LIVER ENZYMES/SS   Visit Type:  Initial Consult Referring Provider:  Cyril [name] Primary Care Provider:  [name]  Chief Complaint:  elevated liver enzymes.  History of Present Illness: Cindy [name] is here for further evaluation of chronically elevated LFTs in setting of chronic alcohol abuse. This is her third visit since 2008 for the same. She continues to drink most days. Usually three days per week she drinks three 40 ounce beers, other days none to one 40 ounce beer. She c/o right upper quadrant/right flank pain especially if she's been on her feet for long period of time. She has occasional heartburn and takes Prilosec as needed. No n/v, constipation, diarrhea, melena, brbpr, dysphagia. She has never had TCS/EGD. Previously scheduled for EGD (for gerd and dysphagia) but patient would not do urine drug screen for her pre-op exam so it was cancelled. She admittedly continues to use cocaine.   Labs 09/21/09: Tbili 0.5, AP 91, AST 164, ALT 98, alb 5.4, WBC 4700, H/H 15.3/42.7, Plt 367,000, TSH 3.857. In 3/10, AST 46, ALT 45. Previous w/u in 2009, includes normal iron and ferritin studies. Negative hep B and C markers.    Current Medications (verified): 1)  Amlodipine Besylate 10 Mg Tabs (Amlodipine Besylate) 2)  Norco 5-325 Mg Tabs (Hydrocodone-Acetaminophen) .Marland Kitchen.. 1 By Mouth Q 4 As Needed Pain 3)  Robaxin 500 Mg Tabs (Methocarbamol) .Marland Kitchen.. 1 By Mouth Q 8 As Needed Pain and Tightness 4)  Gabapentin 100 Mg Caps (Gabapentin) .Marland Kitchen.. 1-2 Tabs At Bedtime 5)  Prilosec 20 Mg Cpdr (Omeprazole) .... Prn  Allergies (verified): No Known Drug Allergies  Past History:  Past [name] History: Hyperlipidemia Hypertension CVA, 2005 Polysubstance abuse, cocaine/etoh H/O abnormal lfts  Past Surgical History: Reviewed history from 06/22/2008 and no changes required. NA  Family History: No FH CRC, liver disease. Father, prostate cancer  Social History: Patient is  married. Disabled. Used to work at [employer] that made hard [employer].  Cocaine every two weeks. Etoh, drinks three 40 ounces beers 3 days per week, other days none to one 40 ounce beer per day.   Review of Systems General:  Denies fever, chills, sweats, anorexia, fatigue, weakness, and weight loss. Eyes:  Denies vision loss. ENT:  Denies nasal congestion, sore throat, hoarseness, and difficulty swallowing. CV:  Denies chest pains, angina, palpitations, dyspnea on exertion, and peripheral edema. Resp:  Denies dyspnea at rest, dyspnea with exercise, cough, and sputum. GI:  See HPI. GU:  Denies urinary burning and blood in urine. MS:  Denies joint pain / LOM. Derm:  Denies rash and itching. Neuro:  Denies weakness, frequent headaches, memory loss, and confusion. Psych:  Denies depression and anxiety. Endo:  Denies unusual weight change. Heme:  Denies bruising and bleeding. Allergy:  Denies hives and rash.  Vital Signs:  Patient profile:   50 year old female Height:      64 inches Weight:      141 pounds BMI:     24.29 Temp:     98.4 degrees F oral Pulse rate:   72 / minute BP sitting:   132 / 82  (left arm) Cuff size:   regular  Vitals Entered By: Cindy [name] (October 23, 1608 10:37 AM)  Physical Exam  General:  Well developed, well nourished, no acute distress. Head:  Normocephalic and atraumatic. Eyes:  Conjunctivae pink, no scleral icterus.  Mouth:  Oropharyngeal mucosa moist, pink.  No lesions, erythema or exudate.    Neck:  Supple; no masses or thyromegaly. Lungs:  Clear throughout to auscultation. Heart:  Regular rate and rhythm; no murmurs, rubs,  or bruits. Abdomen:  Bowel sounds normal.  Abdomen is soft, nontender, nondistended.  No rebound or guarding.  No hepatosplenomegaly, masses or hernias.  No abdominal bruits.  Extremities:  No clubbing, cyanosis, edema or deformities noted. Neurologic:  Alert and  oriented x4;  grossly normal neurologically. Skin:  Intact  without significant lesions or rashes. Cervical Nodes:  No significant cervical adenopathy. Psych:  Alert and cooperative. Normal mood and affect.  Impression & Recommendations:  Problem # 1:  TRANSAMINASES, SERUM, ELEVATED (ICD-790.4) Patient with h/o of noncompliance and no shows. Back again for elevated LFTs which is likely secondary to ongoing alcohol abuse. Her work up in the past has been hindered by her noncompliance with follow-up, etc.  At this point, patient is not interested in complete alcohol cessation. Needs CT A/P with iv/oral contrast to further evaluation liver, pancreas. If any signs of cirrhosis, she will need EGD for varices screening.  Orders: T-Hepatic Function [id number]) T-Basic Metabolic Panel [id number]) T-AFP Tumor Markers [phone]) T-Lipase [id number]) Consultation Level III ([id number])  Problem # 2:  SCREENING COLORECTAL-CANCER (ICD-V76.51)  Needs screening colonoscopy. Discussed with patient, she will need procedure in OR with her h/o alcohol use/cocaine use. She will have to pass urine drug screen as well. She plans to schedule in the 3-4 weeks. Colonoscopy to be performed in near future.  Risks, alternatives, and benefits including but not limited to the risk of reaction to medication, bleeding, infection, and perforation were addressed.  Patient voiced understanding and provided verbal consent.   Orders: Consultation Level III [phone]) I would like to thank Cindy [name] for allowing [place] to take part in the care of this nice patient.  Appended Document: ELEVATED LIVER ENZYMES/SS Pt only needs a CT ABD w/ and w/o iv contrast.   Appended Document: ELEVATED LIVER ENZYMES/SS New order written and faxed to Radiology.  Appended Document: ELEVATED LIVER ENZYMES/SS Patient will need EGD at time of TCS also because of abnormal esophagus on CT abd.  Appended Document: ELEVATED LIVER ENZYMES/SS EGD added to order and Endo notified.  Appended Document:  ELEVATED LIVER ENZYMES/SS Where are her labs that were ordered?  Appended Document: ELEVATED LIVER ENZYMES/SS Pt has not done yet, will try to do tomorrow.  Appended Document: ELEVATED LIVER ENZYMES/SS Still need her labs done. Patient has been very noncompliant.  Appended Document: ELEVATED LIVER ENZYMES/SS mailed pt letter

## 2010-06-18 NOTE — Letter (Signed)
Summary: [facility]  [address]   [place], [place] [address]   Phone: [phone]  Fax: [phone]     November 16, 2009   Cindy [name] [address] [place], [place]  [address] 08-Nov-1960  Dear Ms. Patty,  During your last appointment, your doctor requested you have some blood work.  Our records indicate you have not had this done.  Remember it is very important to follow your doctor's instructions.   Please have this blood work done as soon as possible.  It is important that patients and their doctor work together in the management/treatment of their health care.  If you have lost or misplaced your lab orders, please give [place] a call and we will gladly give you a new copy.  If you have already had your blood work drawn, please disregard this letter.  Thank you,    Hendricks Limes LPN  [facility] Ph: [phone]   Fax: [phone]

## 2010-06-18 NOTE — Letter (Signed)
Summary: CT SCN ORDER  CT SCN ORDER   Imported By: Ave Filter 10/23/2009 16:47:06  _____________________________________________________________________  External Attachment:    Type:   Image     Comment:   External Document

## 2010-07-09 ENCOUNTER — Ambulatory Visit: Payer: Self-pay | Admitting: Orthopedic Surgery

## 2010-07-17 ENCOUNTER — Encounter: Payer: Self-pay | Admitting: Orthopedic Surgery

## 2010-07-23 ENCOUNTER — Ambulatory Visit: Payer: Self-pay | Admitting: Orthopedic Surgery

## 2010-09-25 ENCOUNTER — Encounter: Payer: Self-pay | Admitting: Orthopedic Surgery

## 2010-09-25 ENCOUNTER — Ambulatory Visit: Payer: Medicaid Other | Admitting: Orthopedic Surgery

## 2010-10-01 NOTE — Assessment & Plan Note (Signed)
NAMEMARYLU, DUDENHOEFFER                   CHART#:  [id number]   DATE:  07/04/2008                       DOB:  1961/02/18   CHIEF COMPLAINT:  Elevated liver enzymes.   SUBJECTIVE:  The patient is here for a followup visit.  She recently saw  Cyril Mourning, nurse practitioner with Dr. Christin Bach for a yearly  Pap and physical.  She again was noted to have elevated transaminases  and was advised to follow up with [place].  Her SGOT was 53, SGPT 49, albumin  4.3, total bilirubin 0.5, alk phos 87, CBC normal.  Lipase again was  elevated at 110.  Her TSH was also up at 9.355.  Vitamin D level low at  15.   We saw the patient first back in August 2009 for elevated LFTs as well.  This is in the setting of chronic alcohol abuse and a history of cocaine  abuse as well.  When I saw her in August 2009, she did have some  complaints of dysphagia and GERD.  We have planned on an EGD under  propofol therapy, however, she denied to obtain urine drug screen as  required to undergo EGD with propofol therapy.  She tells me now that  her reflux really is not much of an issue.  She denies any problem with  swallowing.  Denies any nausea, vomiting, abdominal pain, constipation,  diarrhea, melena, or rectal bleeding.  She states that she has had some  neck pain, which started about 8 months ago.  A couple of weeks ago, she  saw Dr. Fuller [place] and apparently is doing a workup.  She states  she is going to have an ultrasound done.  She also has an elevated TSH  and seen recently by Cyril Mourning, nurse practitioner.  The patient  admittedly continues to consume 1-2, 40-ounce beers daily.  She smoked  cocaine as recently as Saturday.  She states that she does desire to  quit drinking, but really has not even tried.  At one point in time, she  was drinking 5-6, 40-ounce beers daily.  She refuses any help with her  alcohol cessation.   CURRENT MEDICATIONS:  1. Amlodipine daily.  2. Methocarbamol 500 mg  every 8 hours p.r.n.  3. Hydrocodone 5/325 mg every 4 hours p.r.n.   ALLERGIES:  No known drug allergies.   PHYSICAL EXAMINATION:  VITAL SIGNS:  Weight 150 pounds, up 10 pounds.  Temp 98.1, blood pressure 120/80, and pulse 80.GENERAL:  A pleasant,  well-nourished, well-developed female in no acute distress.SKIN:  Warm  and dry.  No jaundice.HEENT:  Sclerae nonicteric.  Oropharyngeal mucosa  moist and pink.CHEST:  Lungs are clear to auscultation.CARDIOVASCULAR:  Regular rate and rhythm.  No murmurs.ABDOMEN:  Positive bowel sounds.  Abdomen is soft and nontender.  Liver edge palpable 3 cm below the right  costal margin in the midclavicular line.  No splenomegaly or masses.  No  abdominal bruits or hernias.LOWER EXTREMITIES:  No edema.   IMPRESSION:  The patient is a 51 year old lady with elevated  transaminases.  She has not been on any cholesterol medications now for  about a year.  She continues to have ongoing significant daily alcohol  abuse.  I suspect her transaminitis is related to ongoing alcohol use.  She did have negative biomarkers  previously as well as normal ferritin,  iron, and TIBC making hemochromatosis less likely.  I spoke with her for  over 20 minutes today regarding the need for her to pursue alcohol  cessation.  She likely does have some underlying pancreatic inflammation  with persistent elevation of her lipase.  We discussed the possibility  of chronic pancreatitis and development of diabetes.  She seemed to  understand the consequences of ongoing alcohol use and did voice a  desire to stop.  Again, she states that she can do this on her own and  refuses any help.   PLAN:  1. I encouraged alcohol cessation.  Recommended alcohol taper,      instruction given to patient.  If anytime she develops tremors or      nervousness, she should let us know and again at anytime she      desires to stop altogether, we could give her Librium to help      prevent DTs.  2. We  will have her come back in 4 weeks to see Dr. Kassie Mends.  We      will have her LFTs, lipase, and alcohol level checked the day      before visit.  At that point in time, we can also determine whether      or not there would be any futility to do a CT of the abdomen to      further evaluate liver and assess for any potential chronic liver      disease/cirrhosis.   ADDENDUM 3310:  CT Scan in August 2010. Consider AFP with next visit.  US:8409-fatty infiltrate likely secondary to EtOH v. cirrhosis.       Tana Coast, P.A.  Electronically Signed     Kassie Mends, M.D.  Electronically Signed    LL/MEDQ  D:  07/04/2008  T:  07/04/2008  Job:  [id number]   cc:   Cyril Mourning, NP  Tilda Burrow, M.D.

## 2010-10-01 NOTE — Consult Note (Signed)
NAMELILYGRACE, RODICK                  ACCOUNT NO.:  [id number]   MEDICAL RECORD NO.:  [id number]          PATIENT TYPE:  AMB   LOCATION:  DAY                           FACILITY:  [facility]   PHYSICIAN:  Kassie Mends, M.D.      DATE OF BIRTH:  06-01-1960   DATE OF CONSULTATION:  01/06/2008  DATE OF DISCHARGE:                                 CONSULTATION   REASON FOR CONSULTATION:  Elevated LFTs.   PHYSICIAN REQUESTING CONSULTATION:  Adline Potter, Nurse  Practitioner with Dr. Tilda Burrow.   PRIMARY CARE PHYSICIAN:  Melony Overly, PA-C at [facility].   HISTORY OF PRESENT ILLNESS:  Ms. Villela is a 50 year old lady who  presents today for further evaluation of elevated LFTs.  She states she  was first told this couple of months ago.  Initially, they held her  Zocor.  Her LFTs remained elevated specifically transaminases.  She has  since had an abdominal ultrasound, which revealed probable mild fatty  infiltration of the liver.  Hepatitis B and C markers were negative.  Her lipase was elevated 117 on December 06, 2007.  Her GGT was 131.  The  patient has a history of a significant alcohol abuse.  She has a history  of drinking 5-6, 40-ounce beers daily, she dropped back in the last  couple weeks to 2, 40-ounce beers daily.  She states there has only been  a couple of days in the last month that she has been without beer  altogether.  She has a chronic alcohol abuser.  She has never been to  rehab.  She states she has had desires to quit, but never has tried.  She complains of fatigue.  She has heartburn regularly.  She does not  take any medication for it.  She complains of dysphagia to meats.  She  states her appetite is good.  Her bowel movements are regular.  No  melena or rectal bleeding.  She does not have any significant daily  activities that she does.  She states she just sits around and watches  TV.  She drinks beer throughout the day.  She is on disability  since  2005.  She reports due to her stroke, she had and has had some right-  sided weakness from that.   CURRENT MEDICATIONS:  Amlodipine daily.   ALLERGIES:  No known drug allergies.   PAST MEDICAL HISTORY:  1. Hypertension.  2. Borderline diabetes.  3. Hypercholesterolemia.  4. History of stroke as above.   PAST MEDICAL SURGERIES:  No prior surgeries.   FAMILY HISTORY:  Negative for colorectal cancer or liver disease.  Dad  died at 42, had prostate cancer.   SOCIAL HISTORY:  She is married.  She is on disability.  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  She consumes alcohol as outlined  above.  She denies any history of drug use, tattoos, and blood  transfusions.   REVIEW OF SYSTEMS:  See HPI for GI.  CONSTITUTIONAL:  Denies any  unintentional weight  loss.  CARDIOPULMONARY:  No chest pain or shortness  of breath.  GENITOURINARY:  No dysuria or hematuria.   PHYSICAL EXAMINATION:  ,  VITAL SIGNS:  Weight 140, height 5 feet 3, temperature 99.2, blood  pressure 118/82, and pulse 88.  GENERAL:  Pleasant well-nourished, well-developed female, in no acute  distress.  SKIN:  Warm and dry.  No jaundice.  HEENT:  Sclerae nonicteric.  Oropharyngeal mucosa moist and pink.  No  lesions, erythema, or exudate.  NECK:  No lymphadenopathy or thyromegaly.  CHEST:  Lungs are clear auscultation.  CARDIAC:  Regular rate and rhythm.  Normal S1-S2.  No murmurs, rubs, or  gallops.  ABDOMEN:  Positive bowel sounds.  Abdomen is soft.  Liver edge palpable  3 cm below the right costal margin in the midclavicular line.  Abdomen  is nontender.  No organomegaly or masses.  No abdominal bruits or  hernias.  LOWER EXTREMITIES:  No edema.   LABORATORY DATA:  On November 03, 2007, her hemoglobin A1c was 5.8, her AST  was 101, ALT 103, total bilirubin 0.4, alkaline phosphatase 70, albumin  4.7, cholesterol 205, LDL 67, HDL 131, and triglycerides 37.  Repeat  labs on November 30, 2007, revealed  AST 124, ALT 77, cholesterol 269, and  LDL 112.  BUN and creatinine was normal.  Hepatitis B surface antigen  negative.  Hepatitis C antibody negative.  Lipase 117 and GGT 131.   IMPRESSION:  Ms. Ridge is a 50 year old lady with history of elevated  LFTs in the setting of chronic alcohol abuse and Zocor.  Abdominal  ultrasound revealed a probable fatty liver.  I suspect that her  transaminitis is multifactorial related to her Zocor as well as  alcoholic hepatitis.  She may have an element of transaminitis due to  fatty liver.  Interestingly initially her transaminases were both  equally elevated, but the last time it was checked and it was more in  the typical pattern seen for alcoholic hepatitis.  She also has a  problem with gastroesophageal reflux disease and dysphagia.  At this  time, there is no evidence of chronic liver disease.   PLAN:  1. EGD under propofol therapy given her history of chronic alcohol      abuse.  I feel that she will need to have more than conscious      sedation for adequate exam.  2. Prilosec 20 mg daily #5 samples provided and prescription for #30      with refills.  3. Recheck LFTs, amylase, and lipase now.  4. I have encouraged her to stop the alcohol consumption.  At this      time, she is not willing to stop altogether.  Therefore, I will not      be given her Librium.  I have asked her to try to cut back      gradually.  She is suppose to cut back to one 40 ounce beer every 4-      5 days and when she gets down to one 40 ounce beer daily, she will      then      cut back to half beer daily for 4-5 days before stopping.  She will      call at any time if she develops tremors or nervousness.  If she      decides [place] to stop altogether, I will be glade to fill her some      Librium.  5. Further recommendations  to follow.      Tana Coast, P.A.      Kassie Mends, M.D.  Electronically Signed    LL/MEDQ  D:  01/06/2008  T:  01/07/2008  Job:   [id number]   cc:   Adline Potter, NP   Tilda Burrow, M.D.  Fax: [phone]   Melony Overly, PA

## 2011-03-24 ENCOUNTER — Other Ambulatory Visit (HOSPITAL_COMMUNITY): Payer: Self-pay | Admitting: Internal Medicine

## 2011-03-24 DIAGNOSIS — Z139 Encounter for screening, unspecified: Secondary | ICD-10-CM

## 2011-03-27 ENCOUNTER — Ambulatory Visit (HOSPITAL_COMMUNITY)
Admission: RE | Admit: 2011-03-27 | Discharge: 2011-03-27 | Disposition: A | Discharge status: Home / Self Care | Payer: Medicaid Other | Source: Ambulatory Visit | Attending: Internal Medicine | Admitting: Internal Medicine

## 2011-03-27 DIAGNOSIS — Z1231 Encounter for screening mammogram for malignant neoplasm of breast: Secondary | ICD-10-CM | POA: Insufficient documentation

## 2011-03-27 DIAGNOSIS — Z139 Encounter for screening, unspecified: Secondary | ICD-10-CM

## 2011-04-02 ENCOUNTER — Ambulatory Visit: Payer: Medicaid Other | Admitting: Urgent Care

## 2011-04-07 ENCOUNTER — Ambulatory Visit: Payer: Medicaid Other | Admitting: Urgent Care

## 2011-04-22 ENCOUNTER — Telehealth: Payer: Self-pay | Admitting: Urgent Care

## 2011-04-22 ENCOUNTER — Ambulatory Visit: Payer: Medicaid Other | Admitting: Urgent Care

## 2011-04-22 NOTE — Telephone Encounter (Signed)
Pt was a no show

## 2012-02-12 ENCOUNTER — Other Ambulatory Visit (HOSPITAL_COMMUNITY)
Admission: RE | Admit: 2012-02-12 | Discharge: 2012-02-12 | Disposition: A | Discharge status: Home / Self Care | Payer: Medicaid Other | Source: Ambulatory Visit | Attending: Obstetrics and Gynecology | Admitting: Obstetrics and Gynecology

## 2012-02-12 ENCOUNTER — Other Ambulatory Visit: Payer: Self-pay | Admitting: Adult Health

## 2012-02-12 DIAGNOSIS — Z1151 Encounter for screening for human papillomavirus (HPV): Secondary | ICD-10-CM | POA: Insufficient documentation

## 2012-02-12 DIAGNOSIS — Z01419 Encounter for gynecological examination (general) (routine) without abnormal findings: Secondary | ICD-10-CM | POA: Insufficient documentation

## 2012-02-13 ENCOUNTER — Other Ambulatory Visit: Payer: Self-pay | Admitting: Adult Health

## 2012-02-13 ENCOUNTER — Ambulatory Visit (HOSPITAL_COMMUNITY)
Admission: RE | Admit: 2012-02-13 | Discharge: 2012-02-13 | Disposition: A | Discharge status: Home / Self Care | Payer: Medicaid Other | Source: Ambulatory Visit | Attending: Adult Health | Admitting: Adult Health

## 2012-02-13 DIAGNOSIS — R062 Wheezing: Secondary | ICD-10-CM | POA: Insufficient documentation

## 2012-02-13 DIAGNOSIS — R059 Cough, unspecified: Secondary | ICD-10-CM | POA: Insufficient documentation

## 2012-02-13 DIAGNOSIS — R05 Cough: Secondary | ICD-10-CM

## 2012-02-13 IMAGING — CR DG CHEST 2V
2 series · 2 of 2 positions shown · non-contrast
Comparison: None.

CLINICAL DATA: 2-week history of wheezing and productive cough.

CHEST - 2 VIEW

[view not recorded (1 of 2)]
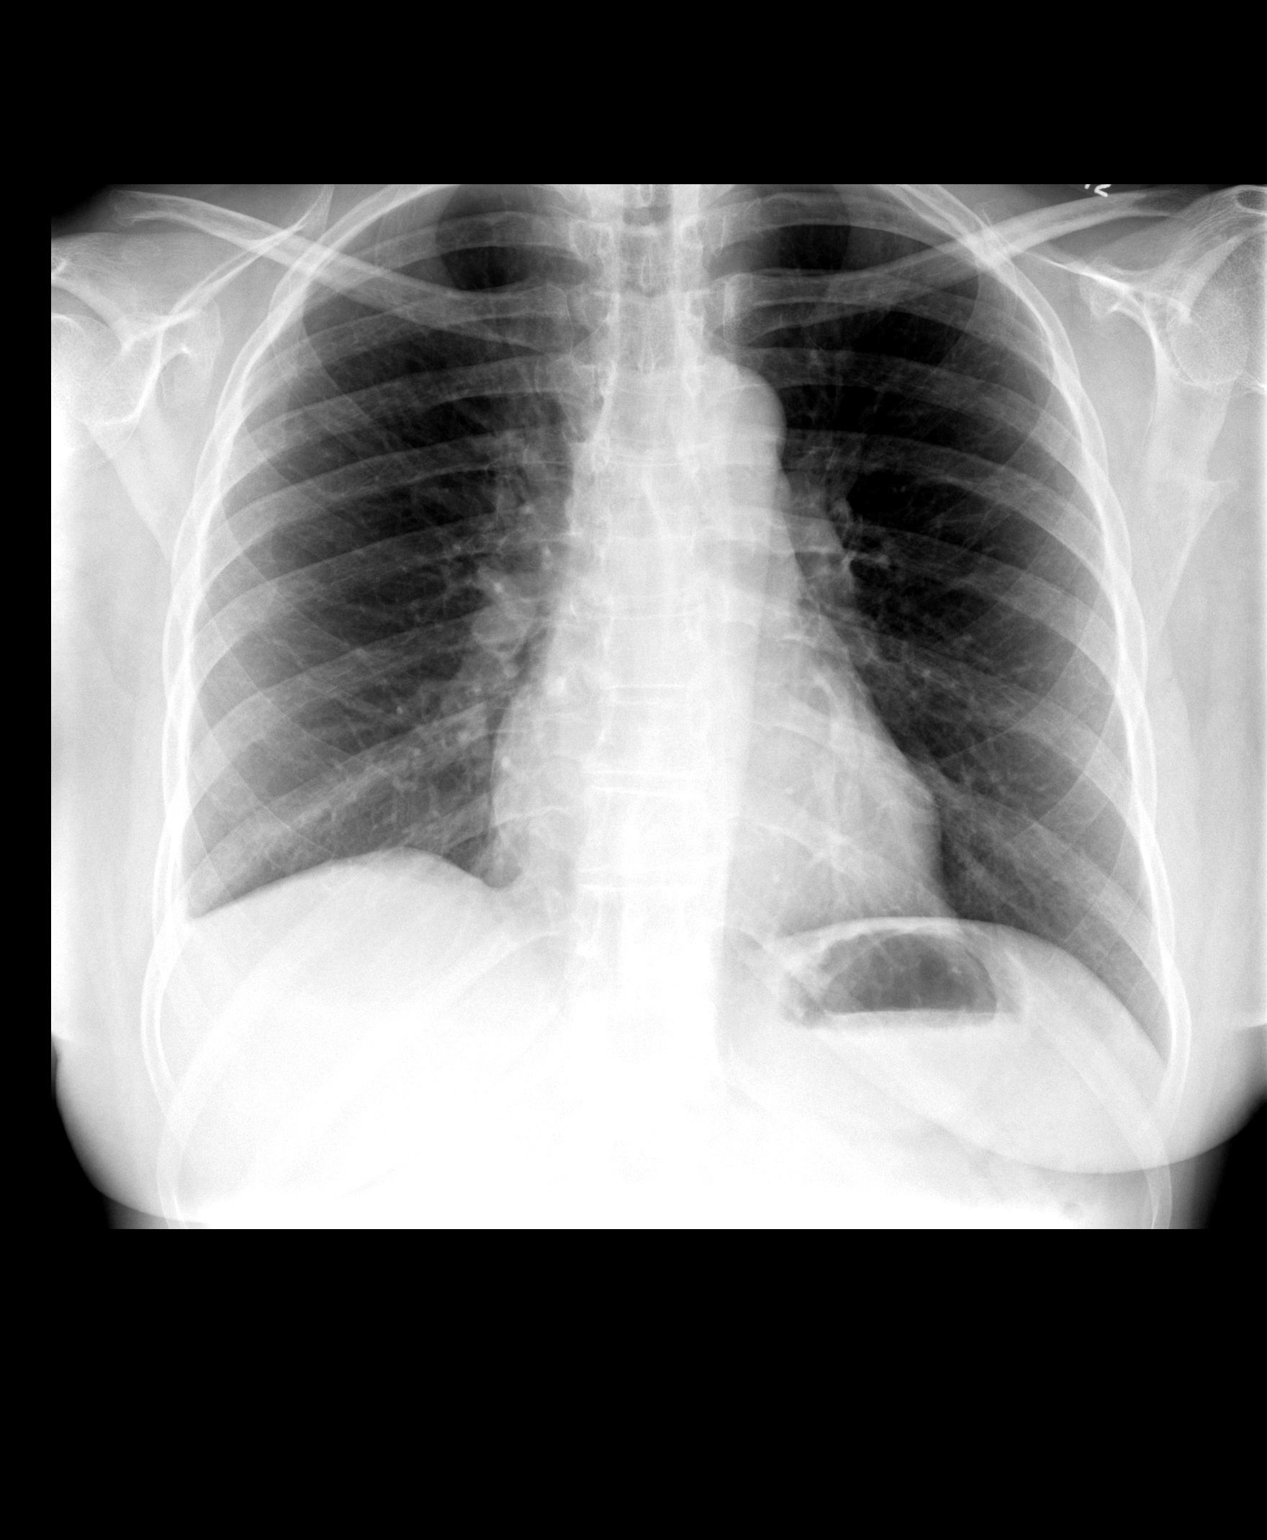

[view not recorded (2 of 2)]
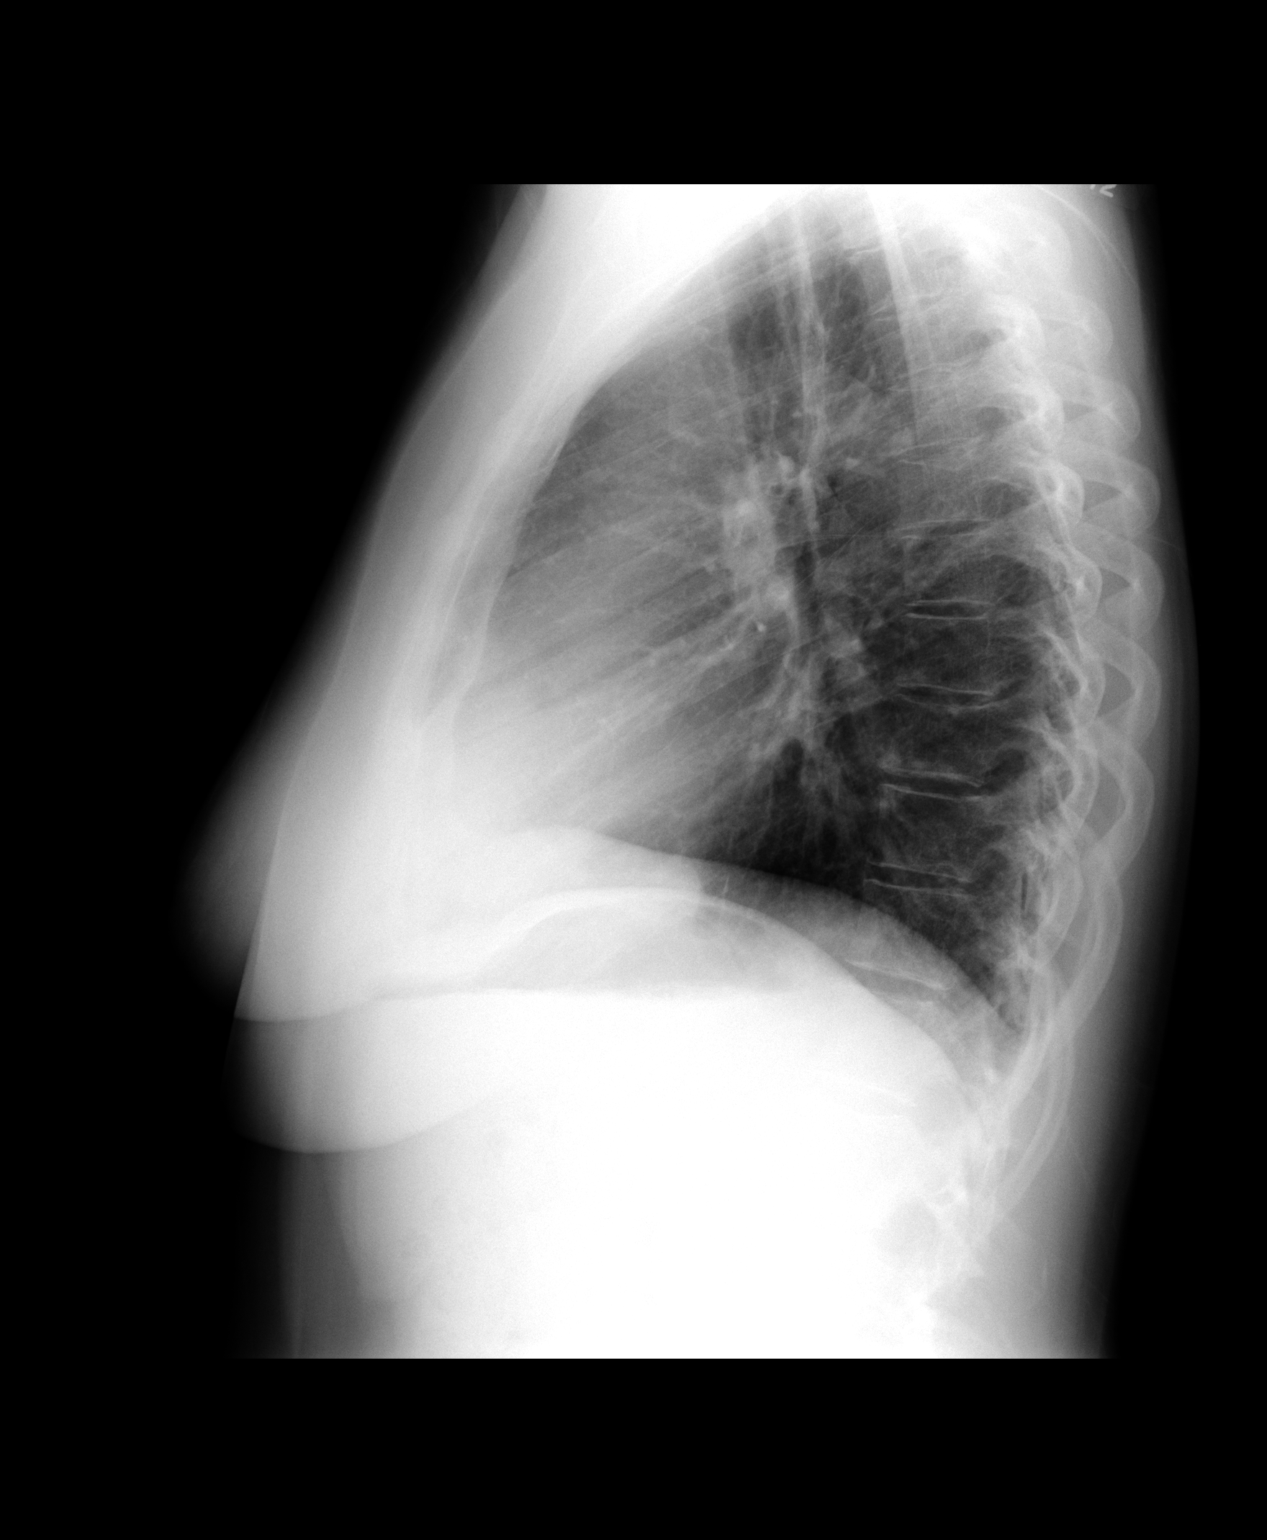

[2 of 2 positions shown; findings below may reference images not displayed]

FINDINGS: Cardiomediastinal silhouette unremarkable.  Lungs clear.
Bronchovascular markings normal.  Pulmonary vascularity normal.  No
pleural effusions.  No pneumothorax.  Visualized bony thorax
intact.
IMPRESSION: Normal examination.

## 2012-09-15 ENCOUNTER — Other Ambulatory Visit: Payer: Self-pay | Admitting: Adult Health

## 2013-03-10 ENCOUNTER — Telehealth: Payer: Self-pay

## 2013-03-10 MED ORDER — AMLODIPINE BESYLATE 10 MG PO TABS: 10.0000 mg | ORAL_TABLET | Freq: Every day | ORAL | Status: DC

## 2013-03-10 NOTE — Telephone Encounter (Signed)
Per Victorino Dike Griffin,NP Amlodipine 10 mg po daily,# 30, 5 refills to [facility].

## 2013-10-04 ENCOUNTER — Other Ambulatory Visit: Payer: Self-pay | Admitting: Adult Health

## 2013-10-04 DIAGNOSIS — Z139 Encounter for screening, unspecified: Secondary | ICD-10-CM

## 2013-10-05 ENCOUNTER — Other Ambulatory Visit: Payer: Self-pay | Admitting: Adult Health

## 2013-10-06 ENCOUNTER — Ambulatory Visit (HOSPITAL_COMMUNITY)
Admission: RE | Admit: 2013-10-06 | Discharge: 2013-10-06 | Disposition: A | Discharge status: Home / Self Care | Payer: Medicaid Other | Source: Ambulatory Visit | Attending: Adult Health | Admitting: Adult Health

## 2013-10-06 DIAGNOSIS — Z1231 Encounter for screening mammogram for malignant neoplasm of breast: Secondary | ICD-10-CM | POA: Insufficient documentation

## 2013-10-06 DIAGNOSIS — Z139 Encounter for screening, unspecified: Secondary | ICD-10-CM

## 2013-10-14 ENCOUNTER — Other Ambulatory Visit: Payer: Self-pay | Admitting: Adult Health

## 2013-10-26 ENCOUNTER — Other Ambulatory Visit: Payer: Self-pay | Admitting: Adult Health

## 2013-11-08 ENCOUNTER — Other Ambulatory Visit: Payer: Self-pay | Admitting: Adult Health

## 2013-11-23 ENCOUNTER — Other Ambulatory Visit: Payer: Self-pay | Admitting: Adult Health

## 2013-12-19 ENCOUNTER — Other Ambulatory Visit: Payer: Self-pay | Admitting: Adult Health

## 2013-12-19 ENCOUNTER — Encounter: Payer: Self-pay | Admitting: *Deleted

## 2014-04-14 ENCOUNTER — Other Ambulatory Visit: Payer: Self-pay | Admitting: Adult Health

## 2014-06-18 ENCOUNTER — Other Ambulatory Visit: Payer: Self-pay | Admitting: Adult Health

## 2014-10-26 ENCOUNTER — Other Ambulatory Visit: Payer: Self-pay | Admitting: Adult Health

## 2014-11-06 ENCOUNTER — Other Ambulatory Visit: Payer: Self-pay | Admitting: Adult Health

## 2014-11-07 ENCOUNTER — Other Ambulatory Visit: Payer: Self-pay | Admitting: Adult Health

## 2014-11-14 ENCOUNTER — Other Ambulatory Visit: Payer: Self-pay | Admitting: Adult Health

## 2015-02-20 ENCOUNTER — Other Ambulatory Visit: Payer: Self-pay | Admitting: Adult Health

## 2015-03-16 ENCOUNTER — Other Ambulatory Visit: Payer: Self-pay | Admitting: Adult Health

## 2015-03-26 ENCOUNTER — Encounter: Payer: Self-pay | Admitting: Adult Health

## 2015-03-26 ENCOUNTER — Other Ambulatory Visit (HOSPITAL_COMMUNITY)
Admission: RE | Admit: 2015-03-26 | Discharge: 2015-03-26 | Disposition: A | Discharge status: Home / Self Care | Payer: Medicaid Other | Source: Ambulatory Visit | Attending: Adult Health | Admitting: Adult Health

## 2015-03-26 ENCOUNTER — Ambulatory Visit (INDEPENDENT_AMBULATORY_CARE_PROVIDER_SITE_OTHER): Payer: Medicaid Other | Admitting: Adult Health

## 2015-03-26 VITALS — BP 130/80 | HR 92 | Ht 62.5 in | Wt 137.0 lb

## 2015-03-26 DIAGNOSIS — Z1151 Encounter for screening for human papillomavirus (HPV): Secondary | ICD-10-CM | POA: Insufficient documentation

## 2015-03-26 DIAGNOSIS — Z0000 Encounter for general adult medical examination without abnormal findings: Secondary | ICD-10-CM | POA: Diagnosis not present

## 2015-03-26 DIAGNOSIS — Z113 Encounter for screening for infections with a predominantly sexual mode of transmission: Secondary | ICD-10-CM | POA: Insufficient documentation

## 2015-03-26 DIAGNOSIS — Z139 Encounter for screening, unspecified: Secondary | ICD-10-CM

## 2015-03-26 DIAGNOSIS — Z01419 Encounter for gynecological examination (general) (routine) without abnormal findings: Secondary | ICD-10-CM | POA: Diagnosis not present

## 2015-03-26 DIAGNOSIS — Z1212 Encounter for screening for malignant neoplasm of rectum: Secondary | ICD-10-CM

## 2015-03-26 DIAGNOSIS — I10 Essential (primary) hypertension: Secondary | ICD-10-CM

## 2015-03-26 LAB — HEMOCCULT GUIAC POC 1CARD (OFFICE): Fecal Occult Blood, POC: NEGATIVE

## 2015-03-26 MED ORDER — AMLODIPINE BESYLATE 10 MG PO TABS: 10.0000 mg | ORAL_TABLET | Freq: Every day | ORAL | Status: DC

## 2015-03-26 NOTE — Patient Instructions (Signed)
Physical in  1 year Mammogram now and yearly  [phone] Referred to dr Jena Gaussourk for colonoscopy F/U with Dr Felecia ShellingFanta

## 2015-03-26 NOTE — Progress Notes (Signed)
Patient ID: Cindy [name], female   DOB: Jun 09, 1960, 54 y.o.   MRN: [id number] History of Present Illness:  Misty StanleyLisa is a 54 year old white female,married in for a well woman gyn exam and pap. PCP is Dr Felecia ShellingFanta.  Current Medications, Allergies, Past Medical History, Past Surgical History, Family History and Social History were reviewed in [facility] electronic medical record.     Review of Systems: Patient denies any headaches, hearing loss, fatigue, blurred vision, shortness of breath, chest pain, abdominal pain, problems with bowel movements, urination, or intercourse. No joint pain or mood swings.She does have hot flashes and night sweats.She had a stroke in 2005, she has hypertension and is taking norvasc.     Physical Exam:BP 130/80 mmHg  Pulse 92  Ht 5' 2.5" (1.588 m)  Wt 137 lb (62.143 kg)  BMI 24.64 kg/m2 General:  Well developed, well nourished, no acute distress Skin:  Warm and dry Neck:  Midline trachea, normal thyroid, good ROM, no lymphadenopathy Lungs; Clear to auscultation bilaterally Breast:  No dominant palpable mass, retraction, or nipple discharge,has scab right breast at 7-8 o'clock where hot water splashed on her when cooking neck bones. Cardiovascular: Regular rate and rhythm Abdomen:  Soft, non tender, no hepatosplenomegaly Pelvic:  External genitalia is normal in appearance, no lesions.  The vagina has decreased color, moisture and rugae. Urethra has no lesions or masses. The cervix is smooth,pap with HPV and GC/CHL performed.  Uterus is felt to be normal size, shape, and contour.  No adnexal masses or tenderness noted.Bladder is non tender, no masses felt. Rectal: Good sphincter tone, no polyps, or hemorrhoids felt.  Hemoccult negative. Extremities/musculoskeletal:  No swelling or varicosities noted, no clubbing or cyanosis Psych:  No mood changes, alert and cooperative,seems happy   Impression: Well woman gyn exam and pap Hypertension     Plan: Refilled  norvasc 10 mg #30 take 1 daily with 11 refills Get mammogram now and yearly Referred to Dr Jena Gaussourk for screening colonoscopy Check CBC,CMP,TSH and lipids,A1c and vitamin D She declines the flu shot  F/U with Dr Felecia ShellingFanta

## 2015-03-27 LAB — CYTOLOGY - PAP

## 2015-03-29 LAB — LIPID PANEL
Chol/HDL Ratio: 6 ratio units — ABNORMAL HIGH (ref 0.0–4.4)
Cholesterol, Total: 241 mg/dL — ABNORMAL HIGH (ref 100–199)
HDL: 40 mg/dL (ref >39)
LDL Calculated: 191 mg/dL — ABNORMAL HIGH (ref 0–99)
Triglycerides: 49 mg/dL (ref 0–149)
VLDL CHOLESTEROL CAL: 10 mg/dL (ref 5–40)

## 2015-03-29 LAB — COMPREHENSIVE METABOLIC PANEL
ALK PHOS: 65 IU/L (ref 39–117)
ALT: 38 IU/L — ABNORMAL HIGH (ref 0–32)
AST: 72 IU/L — AB (ref 0–40)
Albumin/Globulin Ratio: 1.3 (ref 1.1–2.5)
Albumin: 4.4 g/dL (ref 3.5–5.5)
BUN/Creatinine Ratio: 9 (ref 9–23)
BUN: 7 mg/dL (ref 6–24)
Bilirubin Total: 0.6 mg/dL (ref 0.0–1.2)
CO2: 21 mmol/L (ref 18–29)
CREATININE: 0.79 mg/dL (ref 0.57–1.00)
Calcium: 9.9 mg/dL (ref 8.7–10.2)
Chloride: 95 mmol/L — ABNORMAL LOW (ref 97–106)
GFR calc Af Amer: 97 mL/min/{1.73_m2} (ref >59)
GFR calc non Af Amer: 85 mL/min/{1.73_m2} (ref >59)
GLUCOSE: 133 mg/dL — AB (ref 65–99)
Globulin, Total: 3.3 g/dL (ref 1.5–4.5)
Potassium: 4.3 mmol/L (ref 3.5–5.2)
SODIUM: 135 mmol/L — AB (ref 136–144)
Total Protein: 7.7 g/dL (ref 6.0–8.5)

## 2015-03-29 LAB — CBC
HEMOGLOBIN: 14.8 g/dL (ref 11.1–15.9)
Hematocrit: 42.3 % (ref 34.0–46.6)
MCH: 33.5 pg — ABNORMAL HIGH (ref 26.6–33.0)
MCHC: 35 g/dL (ref 31.5–35.7)
MCV: 96 fL (ref 79–97)
Platelets: 363 10*3/uL (ref 150–379)
RBC: 4.42 x10E6/uL (ref 3.77–5.28)
RDW: 12.5 % (ref 12.3–15.4)
WBC: 5 10*3/uL (ref 3.4–10.8)

## 2015-03-29 LAB — VITAMIN D 25 HYDROXY (VIT D DEFICIENCY, FRACTURES): VIT D 25 HYDROXY: 11.3 ng/mL — AB (ref 30.0–100.0)

## 2015-03-29 LAB — TSH: TSH: 3.49 u[IU]/mL (ref 0.450–4.500)

## 2015-03-29 LAB — HEMOGLOBIN A1C
Est. average glucose Bld gHb Est-mCnc: 105 mg/dL
HEMOGLOBIN A1C: 5.3 % (ref 4.8–5.6)

## 2015-04-02 ENCOUNTER — Telehealth: Payer: Self-pay | Admitting: Adult Health

## 2015-04-02 MED ORDER — SIMVASTATIN 20 MG PO TABS: 20.0000 mg | ORAL_TABLET | Freq: Every day | ORAL | Status: DC

## 2015-04-02 NOTE — Telephone Encounter (Signed)
Pt aware of labs and need to decrease alcohol and needs zocor and vitamin D3 5000 IUS daily, and repeat labs in 3 months

## 2015-07-19 ENCOUNTER — Other Ambulatory Visit: Payer: Self-pay | Admitting: Adult Health

## 2016-03-31 ENCOUNTER — Encounter: Payer: Self-pay | Admitting: *Deleted

## 2016-03-31 ENCOUNTER — Other Ambulatory Visit: Payer: Medicaid Other | Admitting: Adult Health

## 2016-03-31 ENCOUNTER — Encounter: Payer: Self-pay | Admitting: Adult Health

## 2016-04-15 ENCOUNTER — Other Ambulatory Visit: Payer: Medicaid Other | Admitting: Adult Health

## 2016-04-25 ENCOUNTER — Other Ambulatory Visit: Payer: Medicaid Other | Admitting: Adult Health

## 2016-09-01 ENCOUNTER — Other Ambulatory Visit: Payer: Medicaid Other | Admitting: Adult Health

## 2016-09-11 ENCOUNTER — Other Ambulatory Visit: Payer: Medicaid Other | Admitting: Adult Health

## 2016-09-13 ENCOUNTER — Other Ambulatory Visit: Payer: Self-pay | Admitting: Adult Health

## 2016-10-14 ENCOUNTER — Other Ambulatory Visit: Payer: Self-pay | Admitting: Adult Health

## 2016-11-18 ENCOUNTER — Other Ambulatory Visit: Payer: Self-pay | Admitting: Women's Health

## 2016-11-20 ENCOUNTER — Other Ambulatory Visit: Payer: Self-pay | Admitting: Adult Health

## 2016-11-24 ENCOUNTER — Other Ambulatory Visit: Payer: Self-pay | Admitting: Women's Health

## 2016-12-10 ENCOUNTER — Other Ambulatory Visit: Payer: Self-pay | Admitting: Women's Health

## 2016-12-13 ENCOUNTER — Other Ambulatory Visit: Payer: Self-pay | Admitting: Women's Health

## 2016-12-15 ENCOUNTER — Other Ambulatory Visit: Payer: Self-pay | Admitting: Adult Health

## 2017-01-14 ENCOUNTER — Other Ambulatory Visit: Payer: Self-pay | Admitting: Adult Health

## 2017-03-09 ENCOUNTER — Other Ambulatory Visit: Payer: Self-pay | Admitting: Adult Health

## 2017-03-14 ENCOUNTER — Other Ambulatory Visit: Payer: Self-pay | Admitting: Adult Health

## 2017-09-24 ENCOUNTER — Other Ambulatory Visit: Payer: Self-pay | Admitting: Adult Health

## 2017-10-29 ENCOUNTER — Other Ambulatory Visit: Payer: Self-pay | Admitting: Adult Health

## 2017-11-30 ENCOUNTER — Other Ambulatory Visit: Payer: Self-pay | Admitting: Adult Health

## 2018-02-01 ENCOUNTER — Other Ambulatory Visit: Payer: Self-pay | Admitting: Adult Health

## 2018-03-11 ENCOUNTER — Other Ambulatory Visit: Payer: Self-pay | Admitting: Adult Health

## 2018-04-09 ENCOUNTER — Other Ambulatory Visit: Payer: Self-pay | Admitting: Adult Health

## 2019-07-30 ENCOUNTER — Other Ambulatory Visit: Payer: Self-pay

## 2019-07-30 ENCOUNTER — Ambulatory Visit: Payer: Self-pay | Attending: Internal Medicine

## 2019-07-30 DIAGNOSIS — Z23 Encounter for immunization: Secondary | ICD-10-CM

## 2019-07-30 NOTE — Progress Notes (Signed)
   Covid-19 Vaccination Clinic  Name:  KIONI STAHL    MRN: [id number] DOB: April 21, 1961  07/30/2019  Ms. Steinkamp was observed post Covid-19 immunization for 15 minutes without incident. She was provided with Vaccine Information Sheet and instruction to access the V-Safe system.   Ms. Casas was instructed to call 911 with any severe reactions post vaccine: Marland Kitchen Difficulty breathing  . Swelling of face and throat  . A fast heartbeat  . A bad rash all over body  . Dizziness and weakness   Immunizations Administered    Name Date Dose VIS Date Route   Moderna COVID-19 Vaccine 07/30/2019 11:50 AM 0.5 mL 04/19/2019 Intramuscular   Manufacturer: Moderna   Lot: [id number]   NDC: 12458-099-83

## 2019-08-05 ENCOUNTER — Emergency Department (HOSPITAL_COMMUNITY): Payer: Self-pay

## 2019-08-05 ENCOUNTER — Encounter (HOSPITAL_COMMUNITY): Payer: Self-pay

## 2019-08-05 ENCOUNTER — Inpatient Hospital Stay (HOSPITAL_COMMUNITY): Payer: Self-pay

## 2019-08-05 ENCOUNTER — Inpatient Hospital Stay (HOSPITAL_COMMUNITY)
Admission: EM | Admit: 2019-08-05 | Discharge: 2019-08-19 | DRG: 100 | Disposition: A | Discharge status: Rehabilitation Facility | Payer: Self-pay | Attending: Internal Medicine | Admitting: Internal Medicine

## 2019-08-05 DIAGNOSIS — Z8249 Family history of ischemic heart disease and other diseases of the circulatory system: Secondary | ICD-10-CM

## 2019-08-05 DIAGNOSIS — E861 Hypovolemia: Secondary | ICD-10-CM | POA: Diagnosis not present

## 2019-08-05 DIAGNOSIS — Z452 Encounter for adjustment and management of vascular access device: Secondary | ICD-10-CM

## 2019-08-05 DIAGNOSIS — F1410 Cocaine abuse, uncomplicated: Secondary | ICD-10-CM | POA: Diagnosis present

## 2019-08-05 DIAGNOSIS — G40901 Epilepsy, unspecified, not intractable, with status epilepticus: Principal | ICD-10-CM

## 2019-08-05 DIAGNOSIS — Z20822 Contact with and (suspected) exposure to covid-19: Secondary | ICD-10-CM | POA: Diagnosis present

## 2019-08-05 DIAGNOSIS — E162 Hypoglycemia, unspecified: Secondary | ICD-10-CM

## 2019-08-05 DIAGNOSIS — I639 Cerebral infarction, unspecified: Secondary | ICD-10-CM

## 2019-08-05 DIAGNOSIS — Z6827 Body mass index (BMI) 27.0-27.9, adult: Secondary | ICD-10-CM

## 2019-08-05 DIAGNOSIS — R1310 Dysphagia, unspecified: Secondary | ICD-10-CM | POA: Diagnosis present

## 2019-08-05 DIAGNOSIS — F1010 Alcohol abuse, uncomplicated: Secondary | ICD-10-CM

## 2019-08-05 DIAGNOSIS — S301XXA Contusion of abdominal wall, initial encounter: Secondary | ICD-10-CM | POA: Diagnosis present

## 2019-08-05 DIAGNOSIS — X58XXXA Exposure to other specified factors, initial encounter: Secondary | ICD-10-CM | POA: Diagnosis present

## 2019-08-05 DIAGNOSIS — R195 Other fecal abnormalities: Secondary | ICD-10-CM | POA: Diagnosis present

## 2019-08-05 DIAGNOSIS — I6340 Cerebral infarction due to embolism of unspecified cerebral artery: Secondary | ICD-10-CM | POA: Insufficient documentation

## 2019-08-05 DIAGNOSIS — F1020 Alcohol dependence, uncomplicated: Secondary | ICD-10-CM | POA: Diagnosis present

## 2019-08-05 DIAGNOSIS — E7800 Pure hypercholesterolemia, unspecified: Secondary | ICD-10-CM | POA: Diagnosis present

## 2019-08-05 DIAGNOSIS — K2211 Ulcer of esophagus with bleeding: Secondary | ICD-10-CM | POA: Diagnosis present

## 2019-08-05 DIAGNOSIS — Z79899 Other long term (current) drug therapy: Secondary | ICD-10-CM

## 2019-08-05 DIAGNOSIS — I69391 Dysphagia following cerebral infarction: Secondary | ICD-10-CM

## 2019-08-05 DIAGNOSIS — K449 Diaphragmatic hernia without obstruction or gangrene: Secondary | ICD-10-CM | POA: Diagnosis present

## 2019-08-05 DIAGNOSIS — Z978 Presence of other specified devices: Secondary | ICD-10-CM

## 2019-08-05 DIAGNOSIS — S3013XA Contusion of flank (latus) region, initial encounter: Secondary | ICD-10-CM

## 2019-08-05 DIAGNOSIS — J9601 Acute respiratory failure with hypoxia: Secondary | ICD-10-CM | POA: Diagnosis present

## 2019-08-05 DIAGNOSIS — R29706 NIHSS score 6: Secondary | ICD-10-CM | POA: Diagnosis not present

## 2019-08-05 DIAGNOSIS — G9340 Encephalopathy, unspecified: Secondary | ICD-10-CM | POA: Diagnosis present

## 2019-08-05 DIAGNOSIS — I10 Essential (primary) hypertension: Secondary | ICD-10-CM | POA: Diagnosis present

## 2019-08-05 DIAGNOSIS — E876 Hypokalemia: Secondary | ICD-10-CM | POA: Diagnosis not present

## 2019-08-05 DIAGNOSIS — Y901 Blood alcohol level of 20-39 mg/100 ml: Secondary | ICD-10-CM | POA: Diagnosis present

## 2019-08-05 DIAGNOSIS — R9401 Abnormal electroencephalogram [EEG]: Secondary | ICD-10-CM | POA: Diagnosis present

## 2019-08-05 DIAGNOSIS — G9341 Metabolic encephalopathy: Secondary | ICD-10-CM | POA: Diagnosis present

## 2019-08-05 DIAGNOSIS — E785 Hyperlipidemia, unspecified: Secondary | ICD-10-CM

## 2019-08-05 DIAGNOSIS — E871 Hypo-osmolality and hyponatremia: Secondary | ICD-10-CM | POA: Diagnosis present

## 2019-08-05 DIAGNOSIS — E663 Overweight: Secondary | ICD-10-CM | POA: Diagnosis present

## 2019-08-05 DIAGNOSIS — D62 Acute posthemorrhagic anemia: Secondary | ICD-10-CM | POA: Diagnosis not present

## 2019-08-05 LAB — POCT I-STAT 7, (LYTES, BLD GAS, ICA,H+H)
Acid-base deficit: 1 mmol/L (ref 0.0–2.0)
Bicarbonate: 20.8 mmol/L (ref 20.0–28.0)
Calcium, Ion: 1.18 mmol/L (ref 1.15–1.40)
HCT: 34 % — ABNORMAL LOW (ref 36.0–46.0)
Hemoglobin: 11.6 g/dL — ABNORMAL LOW (ref 12.0–15.0)
O2 Saturation: 100 %
Potassium: 3.4 mmol/L — ABNORMAL LOW (ref 3.5–5.1)
Sodium: 124 mmol/L — ABNORMAL LOW (ref 135–145)
TCO2: 22 mmol/L (ref 22–32)
pCO2 arterial: 24.7 mmHg — ABNORMAL LOW (ref 32.0–48.0)
pH, Arterial: 7.533 — ABNORMAL HIGH (ref 7.350–7.450)
pO2, Arterial: 196 mmHg — ABNORMAL HIGH (ref 83.0–108.0)

## 2019-08-05 LAB — NA AND K (SODIUM & POTASSIUM), RAND UR
Potassium Urine: 85 mmol/L
Sodium, Ur: 34 mmol/L

## 2019-08-05 LAB — COMPREHENSIVE METABOLIC PANEL
ALT: 25 U/L (ref 0–44)
AST: 57 U/L — ABNORMAL HIGH (ref 15–41)
Albumin: 3.7 g/dL (ref 3.5–5.0)
Alkaline Phosphatase: 93 U/L (ref 38–126)
Anion gap: 18 — ABNORMAL HIGH (ref 5–15)
BUN: 8 mg/dL (ref 6–20)
CO2: 16 mmol/L — ABNORMAL LOW (ref 22–32)
Calcium: 9.2 mg/dL (ref 8.9–10.3)
Chloride: 90 mmol/L — ABNORMAL LOW (ref 98–111)
Creatinine, Ser: 1.05 mg/dL — ABNORMAL HIGH (ref 0.44–1.00)
GFR calc Af Amer: >60 mL/min (ref >60)
GFR calc non Af Amer: 58 mL/min — ABNORMAL LOW (ref >60)
Glucose, Bld: 87 mg/dL (ref 70–99)
Potassium: 4 mmol/L (ref 3.5–5.1)
Sodium: 124 mmol/L — ABNORMAL LOW (ref 135–145)
Total Bilirubin: 1.1 mg/dL (ref 0.3–1.2)
Total Protein: 8.1 g/dL (ref 6.5–8.1)

## 2019-08-05 LAB — TROPONIN I (HIGH SENSITIVITY)
Troponin I (High Sensitivity): 10 ng/L (ref <18)
Troponin I (High Sensitivity): 33 ng/L — ABNORMAL HIGH (ref <18)

## 2019-08-05 LAB — URINALYSIS, COMPLETE (UACMP) WITH MICROSCOPIC
Bilirubin Urine: NEGATIVE
Glucose, UA: 50 mg/dL — AB
Ketones, ur: 20 mg/dL — AB
Nitrite: NEGATIVE
Protein, ur: 100 mg/dL — AB
Specific Gravity, Urine: 1.018 (ref 1.005–1.030)
pH: 6 (ref 5.0–8.0)

## 2019-08-05 LAB — URINALYSIS, ROUTINE W REFLEX MICROSCOPIC
Bilirubin Urine: NEGATIVE
Glucose, UA: >=500 mg/dL — AB
Ketones, ur: 5 mg/dL — AB
Leukocytes,Ua: NEGATIVE
Nitrite: NEGATIVE
Protein, ur: 100 mg/dL — AB
Specific Gravity, Urine: 1.005 (ref 1.005–1.030)
pH: 5 (ref 5.0–8.0)

## 2019-08-05 LAB — CBC WITH DIFFERENTIAL/PLATELET
Abs Immature Granulocytes: 0.06 10*3/uL (ref 0.00–0.07)
Basophils Absolute: 0.1 10*3/uL (ref 0.0–0.1)
Basophils Relative: 1 %
Eosinophils Absolute: 0 10*3/uL (ref 0.0–0.5)
Eosinophils Relative: 0 %
HCT: 43.4 % (ref 36.0–46.0)
Hemoglobin: 14.9 g/dL (ref 12.0–15.0)
Immature Granulocytes: 1 %
Lymphocytes Relative: 19 %
Lymphs Abs: 2.1 10*3/uL (ref 0.7–4.0)
MCH: 32.8 pg (ref 26.0–34.0)
MCHC: 34.3 g/dL (ref 30.0–36.0)
MCV: 95.6 fL (ref 80.0–100.0)
Monocytes Absolute: 0.4 10*3/uL (ref 0.1–1.0)
Monocytes Relative: 4 %
Neutro Abs: 8.2 10*3/uL — ABNORMAL HIGH (ref 1.7–7.7)
Neutrophils Relative %: 75 %
Platelets: 338 10*3/uL (ref 150–400)
RBC: 4.54 MIL/uL (ref 3.87–5.11)
RDW: 11.9 % (ref 11.5–15.5)
WBC: 10.9 10*3/uL — ABNORMAL HIGH (ref 4.0–10.5)
nRBC: 0 % (ref 0.0–0.2)

## 2019-08-05 LAB — BLOOD GAS, ARTERIAL
Acid-base deficit: 7.6 mmol/L — ABNORMAL HIGH (ref 0.0–2.0)
Bicarbonate: 18.8 mmol/L — ABNORMAL LOW (ref 20.0–28.0)
FIO2: 100
O2 Saturation: 99.8 %
Patient temperature: 37
pCO2 arterial: 29.2 mmHg — ABNORMAL LOW (ref 32.0–48.0)
pH, Arterial: 7.372 (ref 7.350–7.450)
pO2, Arterial: 536 mmHg — ABNORMAL HIGH (ref 83.0–108.0)

## 2019-08-05 LAB — RAPID URINE DRUG SCREEN, HOSP PERFORMED
Amphetamines: NOT DETECTED
Barbiturates: NOT DETECTED
Benzodiazepines: NOT DETECTED
Cocaine: POSITIVE — AB
Opiates: NOT DETECTED
Tetrahydrocannabinol: NOT DETECTED

## 2019-08-05 LAB — RESPIRATORY PANEL BY RT PCR (FLU A&B, COVID)
Influenza A by PCR: NEGATIVE
Influenza B by PCR: NEGATIVE
SARS Coronavirus 2 by RT PCR: NEGATIVE

## 2019-08-05 LAB — CBG MONITORING, ED: Glucose-Capillary: 76 mg/dL (ref 70–99)

## 2019-08-05 LAB — LIPASE, BLOOD: Lipase: 69 U/L — ABNORMAL HIGH (ref 11–51)

## 2019-08-05 LAB — I-STAT CHEM 8, ED
BUN: 6 mg/dL (ref 6–20)
Calcium, Ion: 1.02 mmol/L — ABNORMAL LOW (ref 1.15–1.40)
Chloride: 94 mmol/L — ABNORMAL LOW (ref 98–111)
Creatinine, Ser: 1.2 mg/dL — ABNORMAL HIGH (ref 0.44–1.00)
Glucose, Bld: 84 mg/dL (ref 70–99)
HCT: 46 % (ref 36.0–46.0)
Hemoglobin: 15.6 g/dL — ABNORMAL HIGH (ref 12.0–15.0)
Potassium: 4.1 mmol/L (ref 3.5–5.1)
Sodium: 125 mmol/L — ABNORMAL LOW (ref 135–145)
TCO2: 18 mmol/L — ABNORMAL LOW (ref 22–32)

## 2019-08-05 LAB — MAGNESIUM: Magnesium: 1.8 mg/dL (ref 1.7–2.4)

## 2019-08-05 LAB — LACTIC ACID, PLASMA: Lactic Acid, Venous: 2.7 mmol/L (ref 0.5–1.9)

## 2019-08-05 LAB — BRAIN NATRIURETIC PEPTIDE: B Natriuretic Peptide: 121 pg/mL — ABNORMAL HIGH (ref 0.0–100.0)

## 2019-08-05 LAB — ACETAMINOPHEN LEVEL: Acetaminophen (Tylenol), Serum: <10 ug/mL — ABNORMAL LOW (ref 10–30)

## 2019-08-05 LAB — SALICYLATE LEVEL: Salicylate Lvl: <7 mg/dL — ABNORMAL LOW (ref 7.0–30.0)

## 2019-08-05 LAB — PROTIME-INR
INR: 1 (ref 0.8–1.2)
Prothrombin Time: 12.9 seconds (ref 11.4–15.2)

## 2019-08-05 LAB — ETHANOL: Alcohol, Ethyl (B): 21 mg/dL — ABNORMAL HIGH (ref <10)

## 2019-08-05 IMAGING — DX DG CHEST 1V PORT
1 series · 1 of 1 positions shown · non-contrast
Comparison: [DATE]

CLINICAL DATA: Tube placement, post intubation

EXAM:
PORTABLE CHEST 1 VIEW

[chest ap]
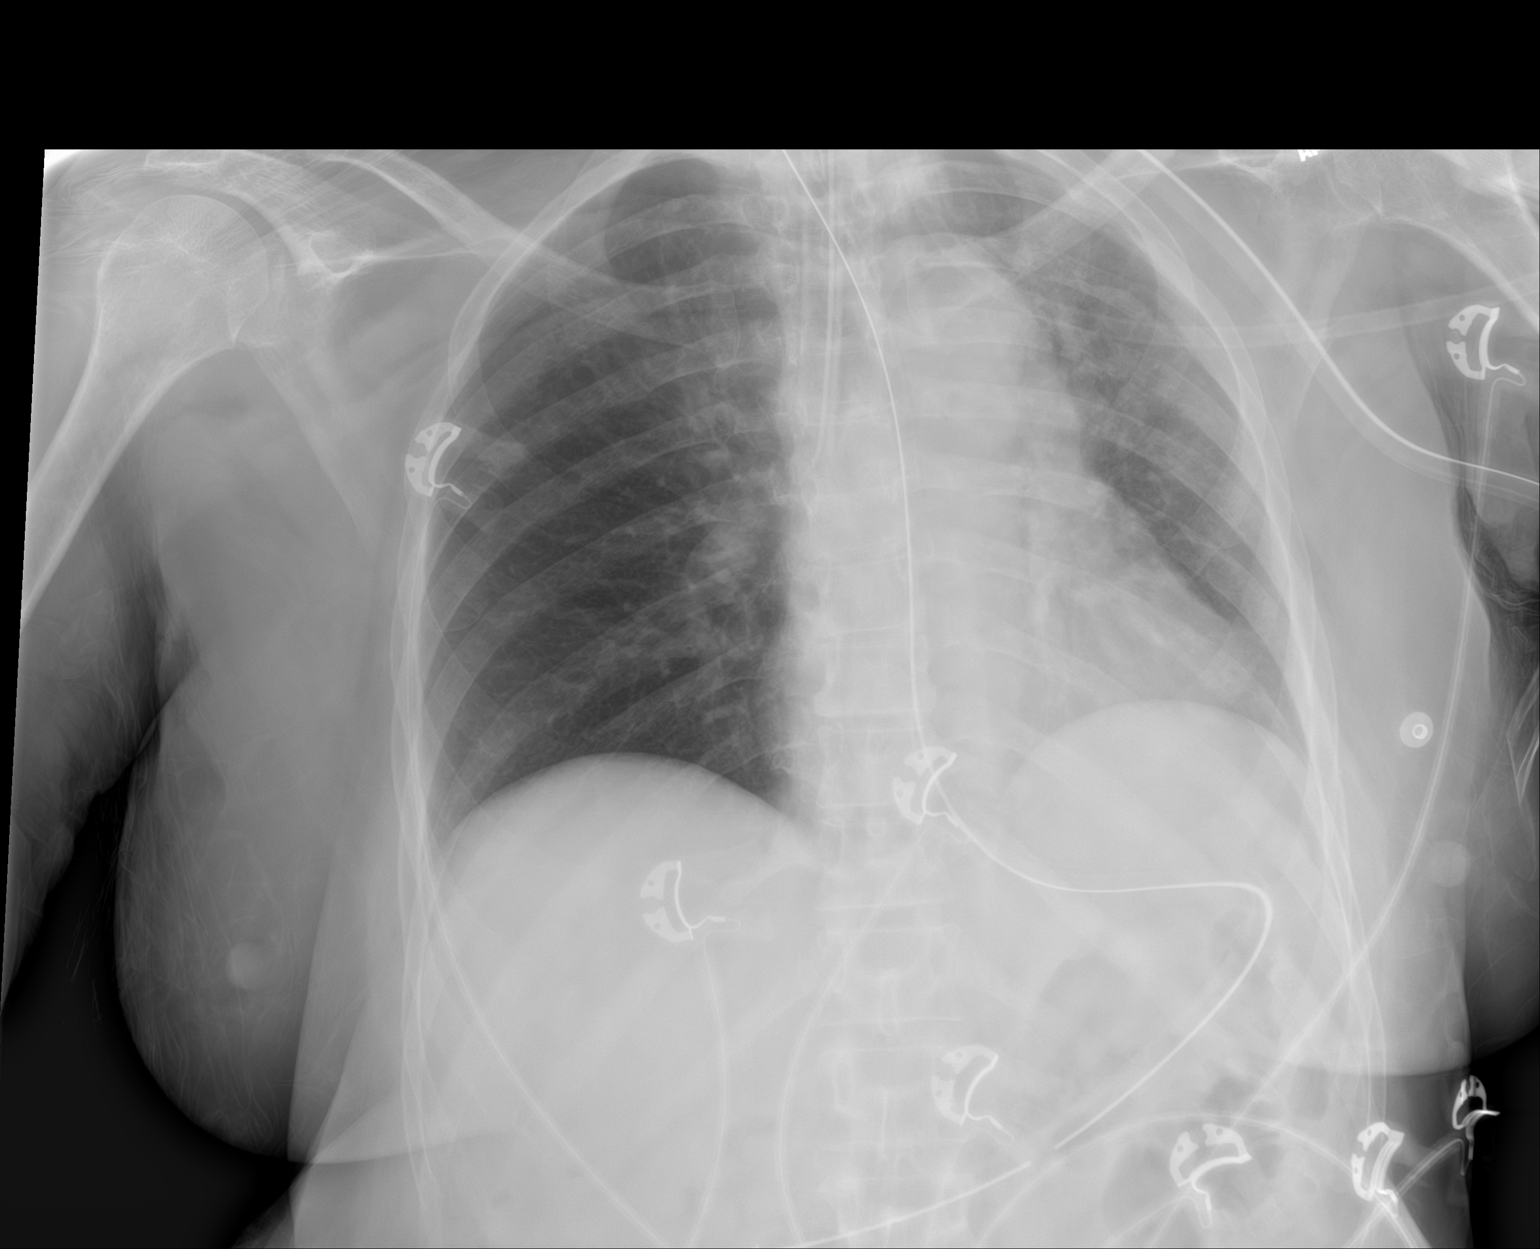

[1 of 1 positions shown; findings below may reference images not displayed]

FINDINGS: Intubation of the right mainstem bronchus. Endotracheal tube is
approximately 2 cm beyond the carina into the right mainstem
bronchus.

Film is rotated. Accounting for this cardiomediastinal contours are
normal.

Hazy opacity throughout the left chest likely reflects hypoinflation
due to right mainstem intubation though subtle areas of airspace
opacity are considered. No evidence of pleural effusion.
Hyperexpansion of the right chest.

Gastric tube in the distal stomach, side port well below GE
junction. Tip off the field of the radiograph.

Visualized skeletal structures are unremarkable.
IMPRESSION: 1. Intubation of the right mainstem bronchus with hypoinflation of
the left chest. Difficult to exclude areas of airspace disease in
the left chest. Suggest retraction of the endotracheal tube
approximately 3 cm with repeat radiography to assess further.
2. Increased aeration of the right chest related to above.
3. Critical Value/emergent results were called by telephone at the
time of interpretation on [DATE] at [DATE] to provider ING
ING , who verbally acknowledged these results.

## 2019-08-05 IMAGING — DX DG CHEST 1V SAME DAY
1 series · 2 of 2 positions shown · non-contrast
Comparison: [DATE] study obtained earlier in the day

CLINICAL DATA: Hypoxia

EXAM:
CHEST - 1 VIEW SAME DAY

[Series 3: chest ap · 0.14mm/px · 2 of 2 slices shown]
[im 1/2]
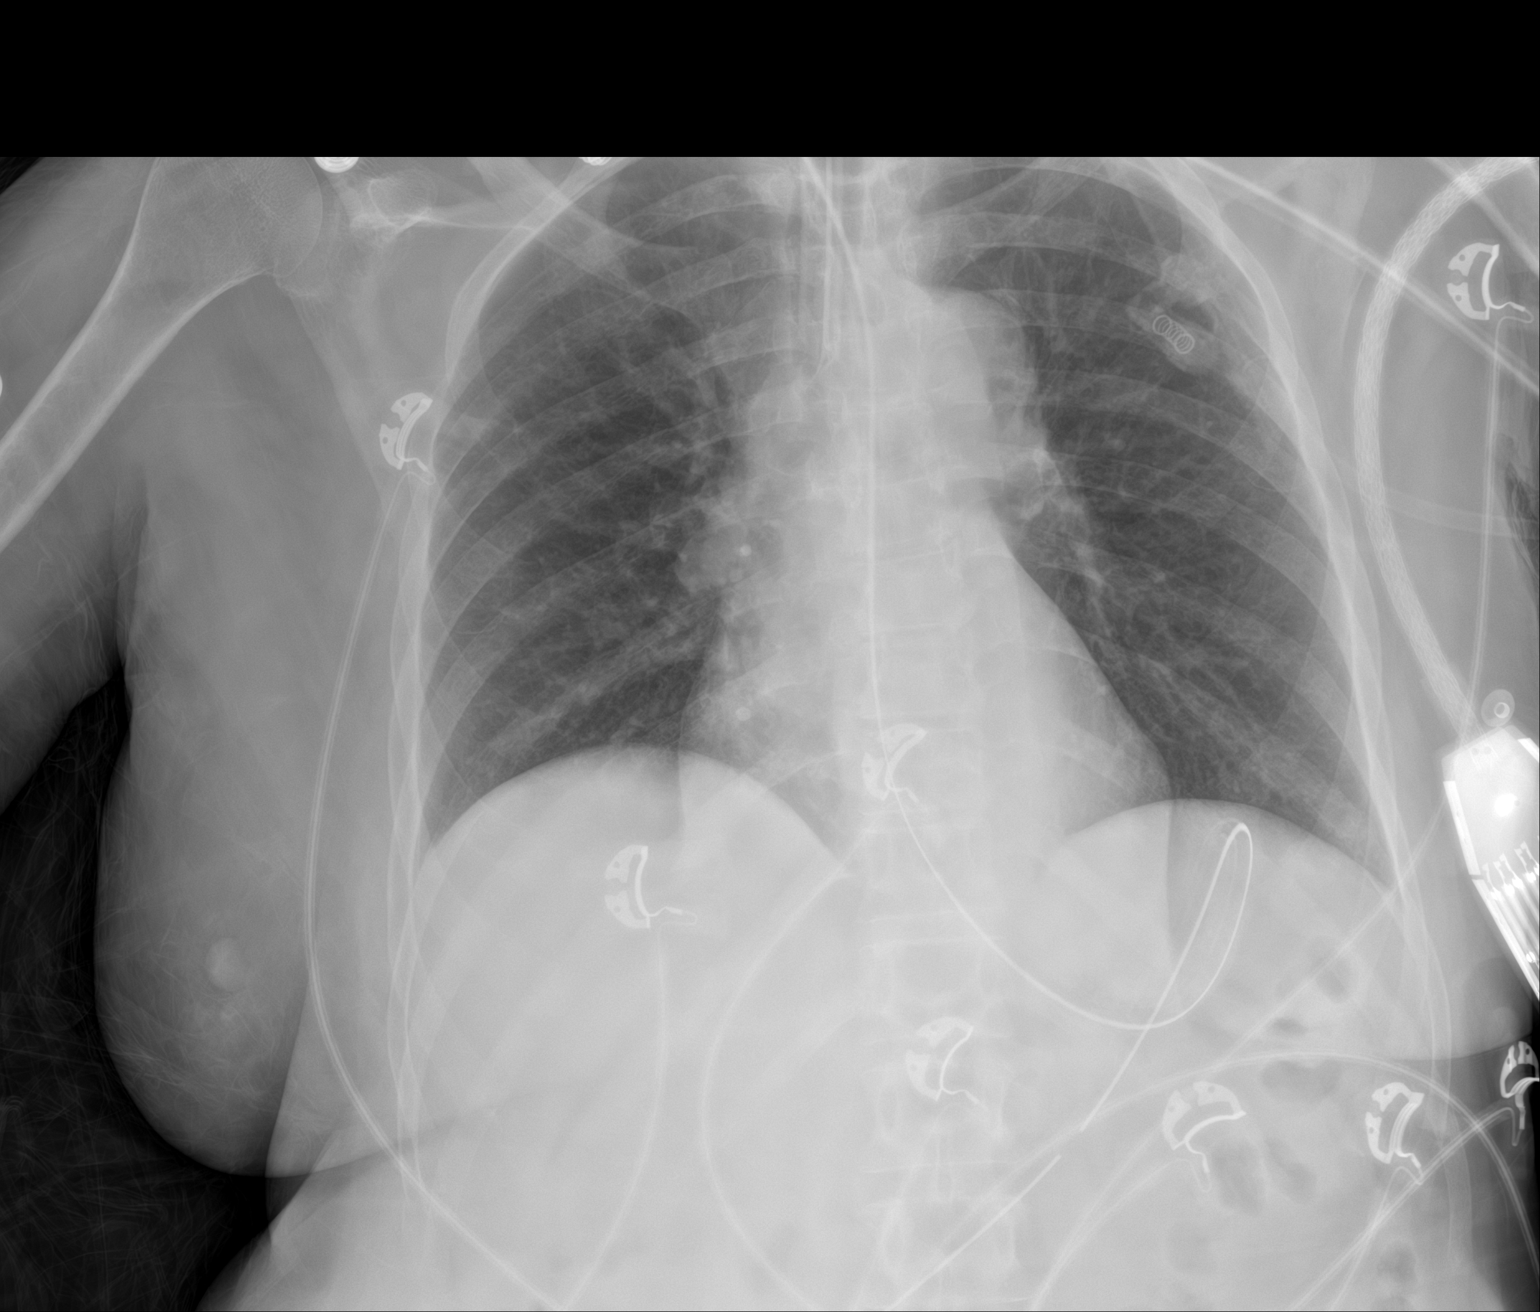
[im 2/2]
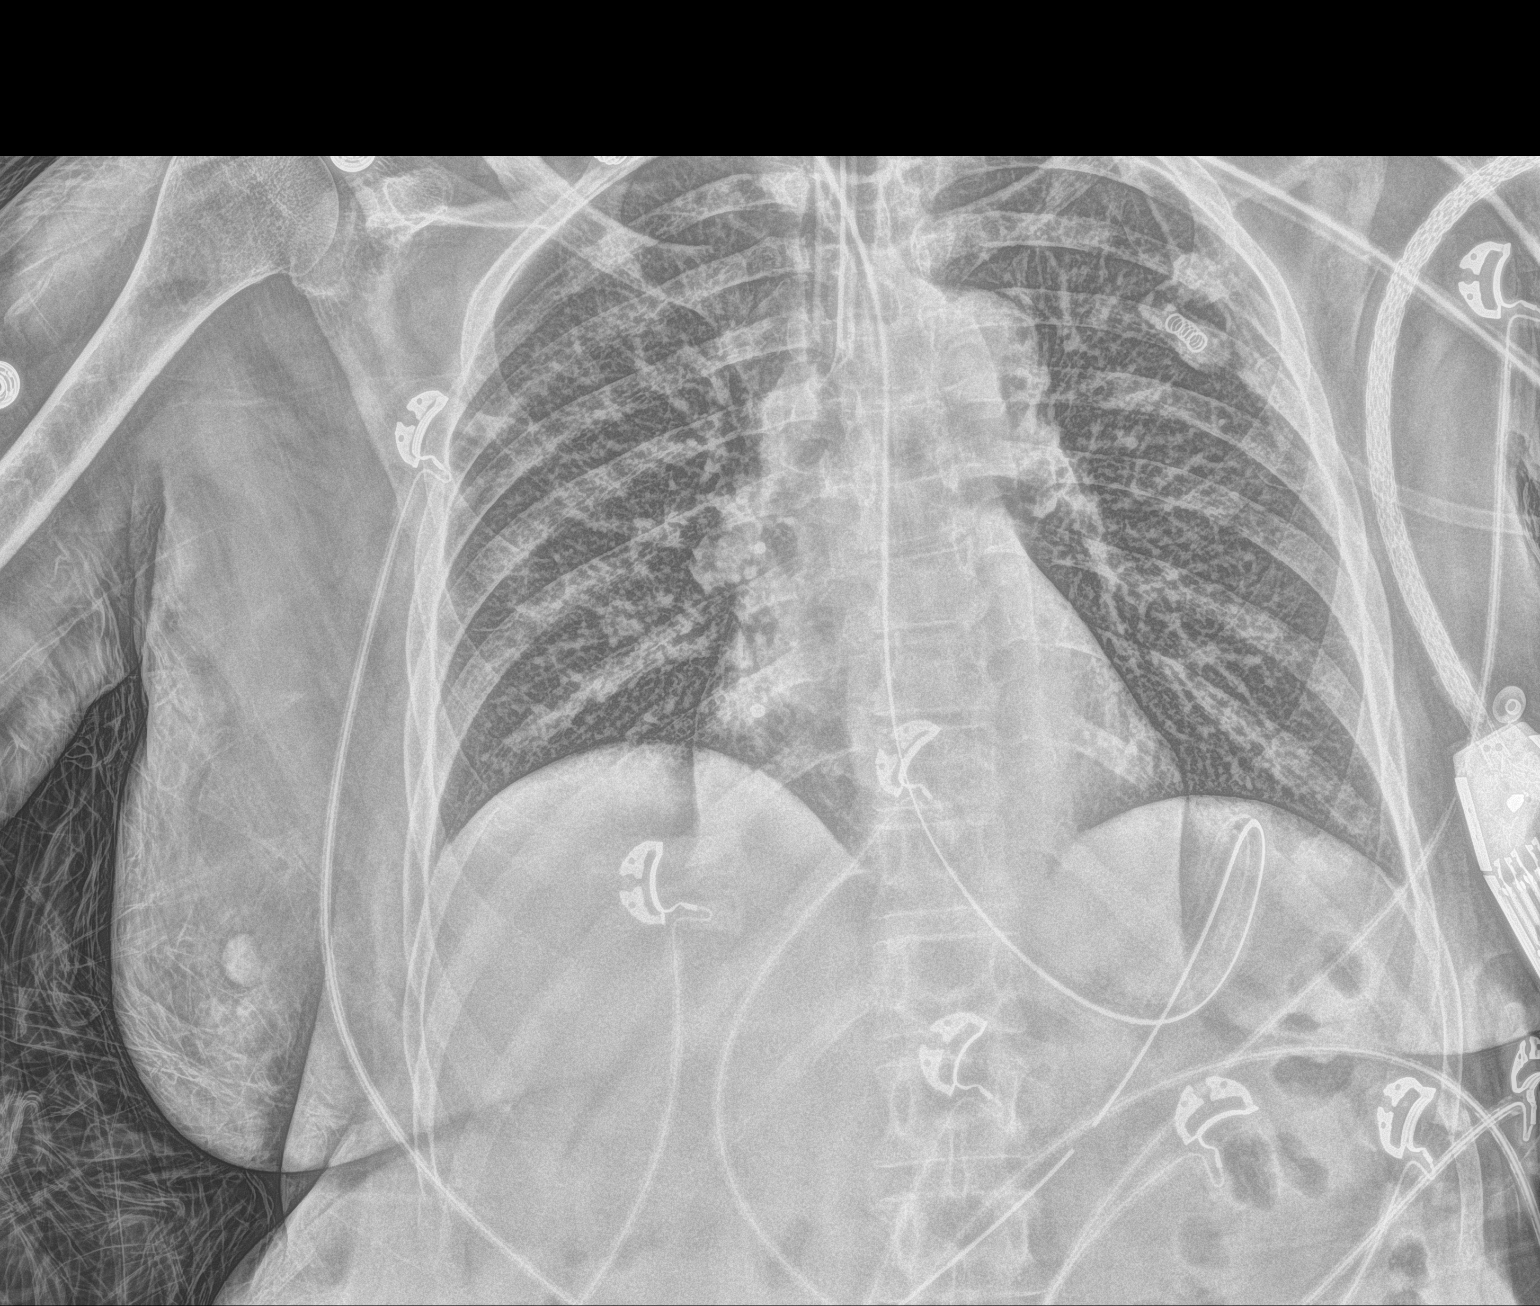

[2 of 2 positions shown; findings below may reference images not displayed]

FINDINGS: The endotracheal tube tip is now 1.4 cm above the carina.
Nasogastric tube tip and side port in stomach. No pneumothorax.
Lungs are clear. Heart size and pulmonary vascularity are normal. No
adenopathy. No bone lesions.
IMPRESSION: Tube positions as described without pneumothorax. Note that the
endotracheal tube tip is now above the carina. Lungs clear. Heart
size normal.

## 2019-08-05 IMAGING — DX DG ABD PORTABLE 1V
2 series · 2 of 2 positions shown · non-contrast
Comparison: None.

CLINICAL DATA: Right flank hematoma

EXAM:
PORTABLE ABDOMEN - 1 VIEW

[abdomen kub (1 of 2)]
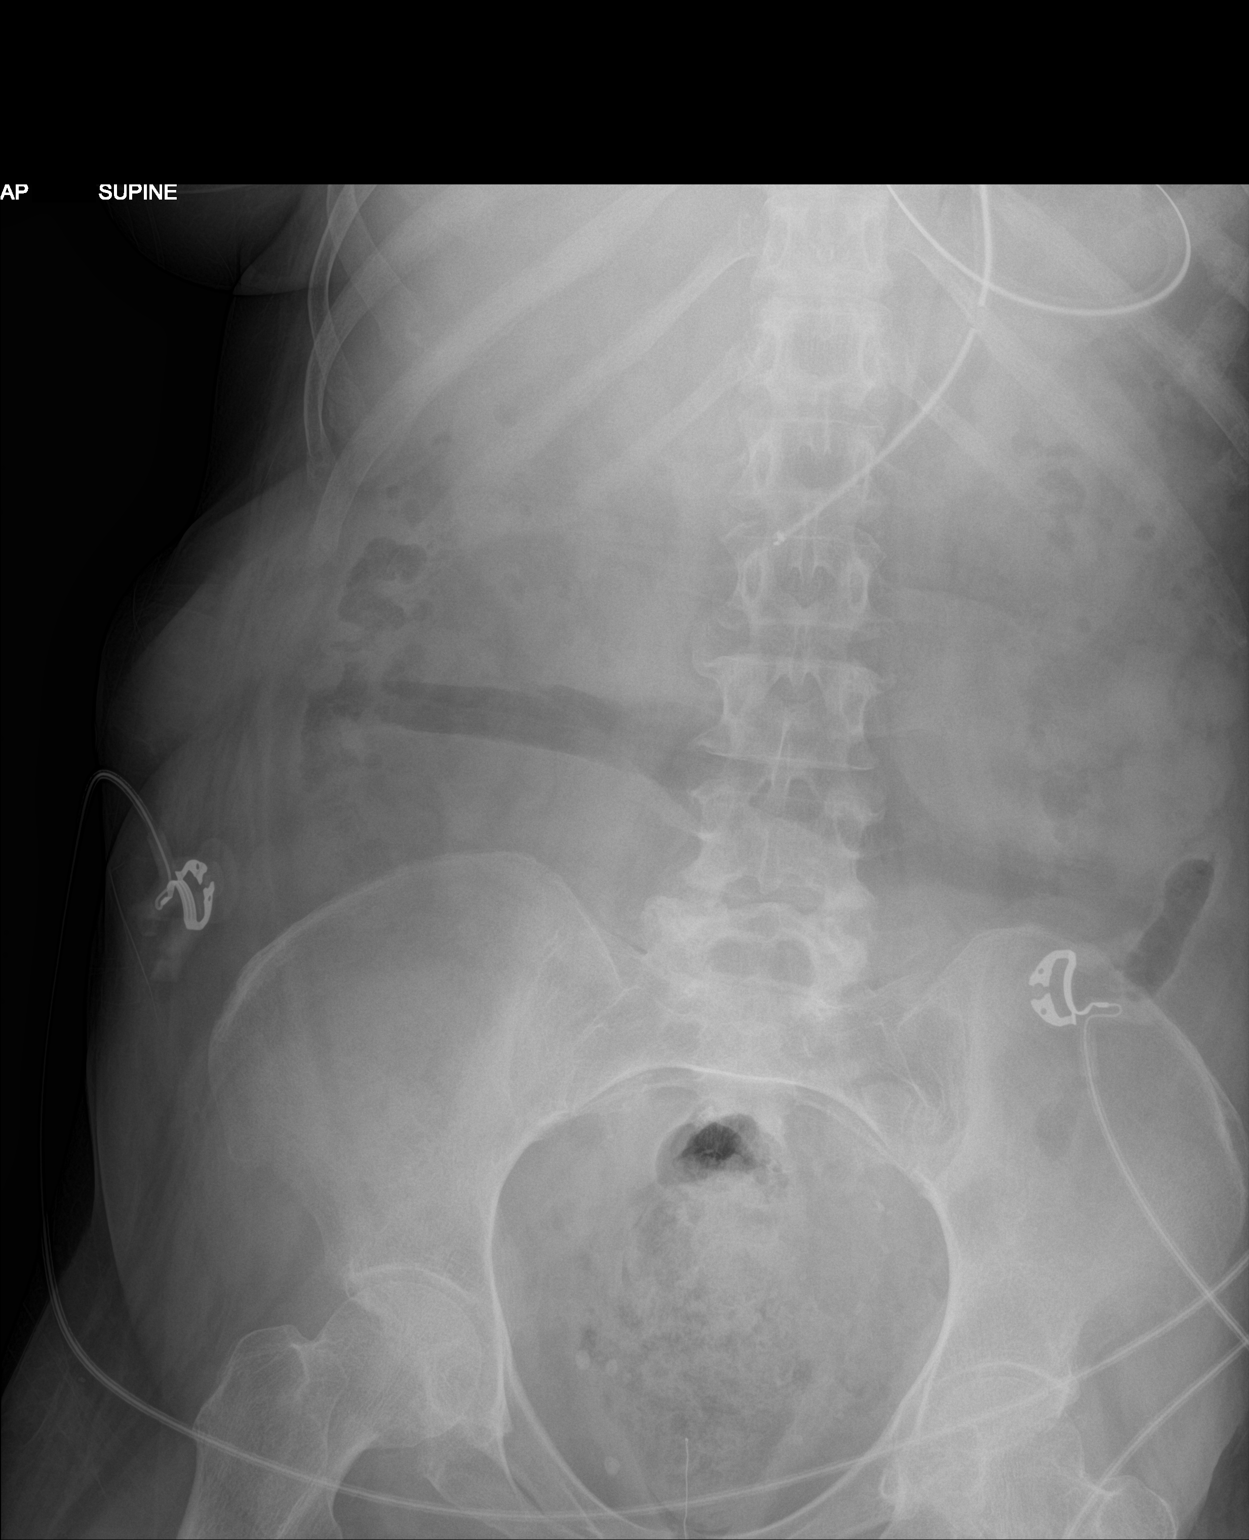

[abdomen kub (2 of 2)]
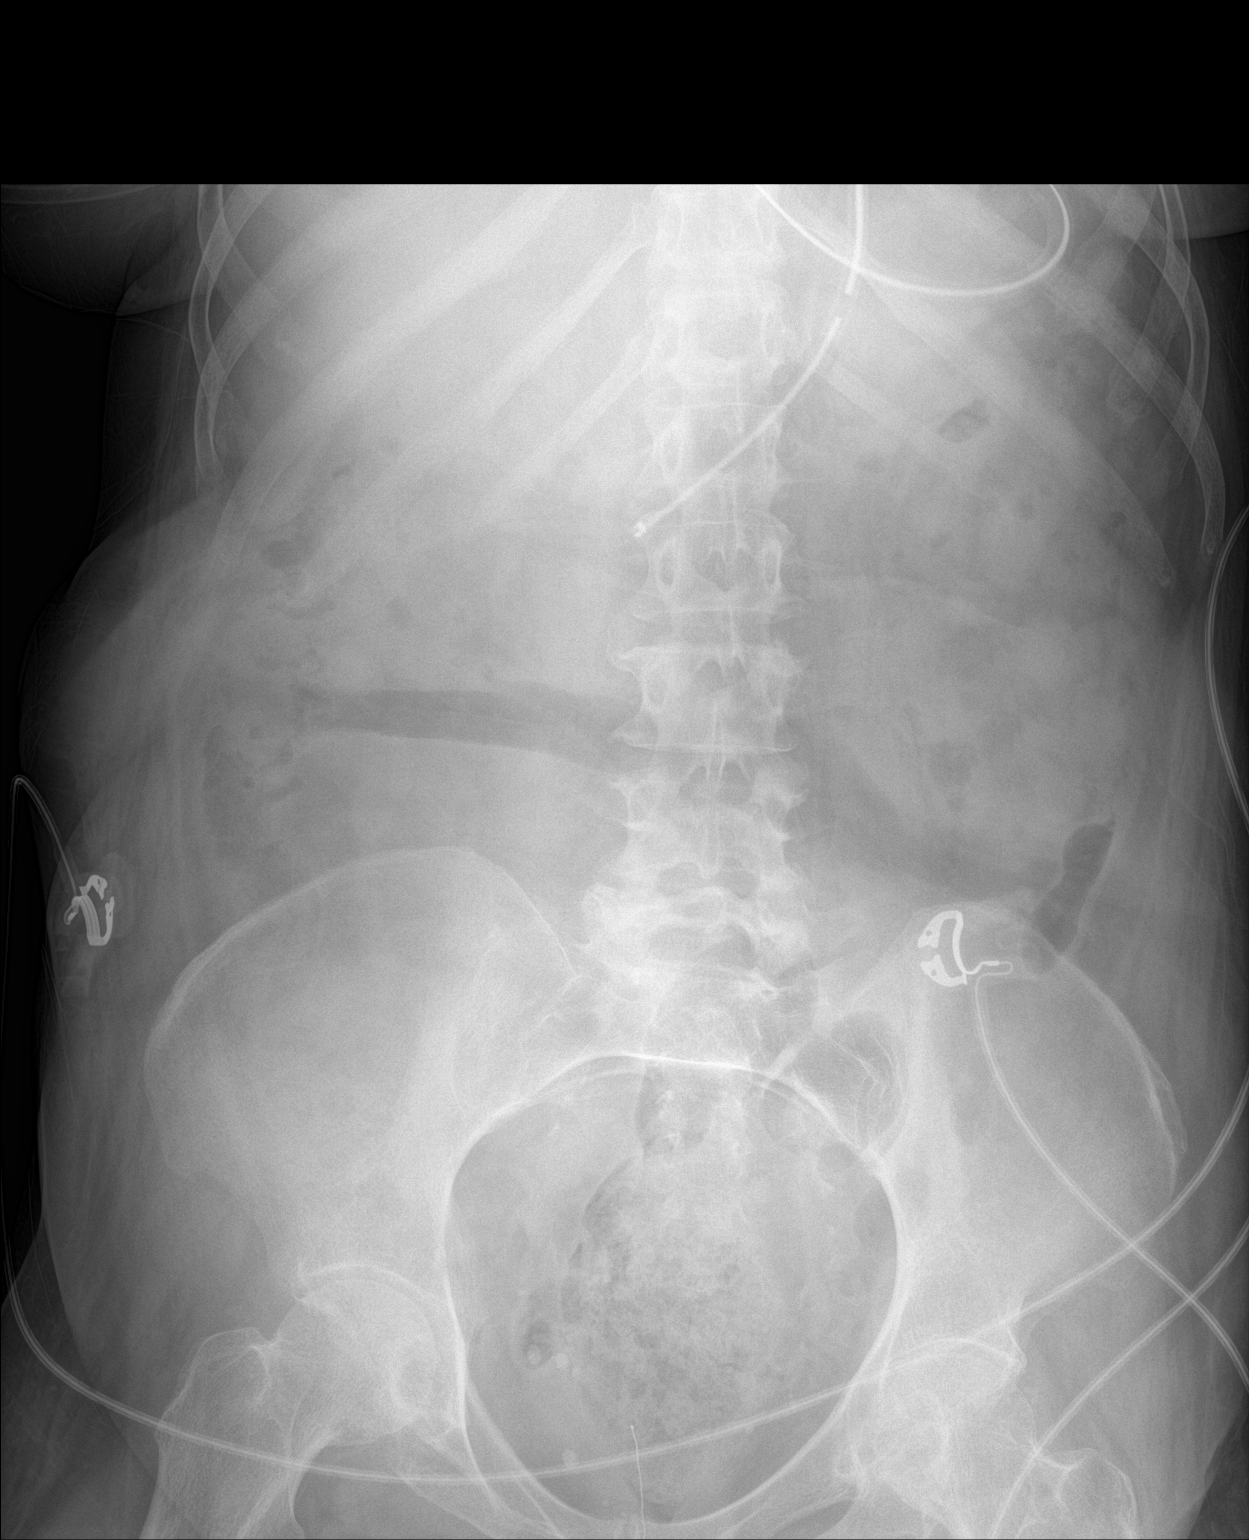

[2 of 2 positions shown; findings below may reference images not displayed]

FINDINGS: NG tube is in the stomach. Nonobstructive bowel gas pattern. No
organomegaly or free air. No suspicious calcification. No acute bony
abnormality.
IMPRESSION: NG tube tip in the distal stomach.

No bowel obstruction or free air.

## 2019-08-05 IMAGING — CT CT HEAD W/O CM
4 of 7 series · 16 of 47 positions shown, 18 images · non-contrast
Comparison: [DATE]

CLINICAL DATA: AMS, seizures

EXAM:
CT HEAD WITHOUT CONTRAST
TECHNIQUE: Contiguous axial images were obtained from the base of the skull
through the vertex without intravenous contrast.

[Series 2: head w o · axial · 0.50mm/px · z∈[-117,-22]mm · 5 of 29 slices shown, 7 images]
[im 5/29  brain]
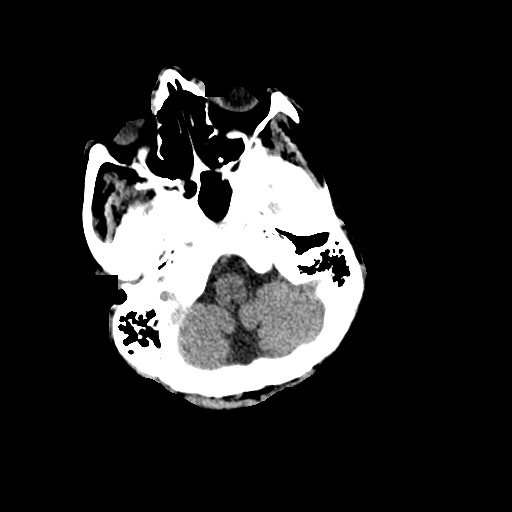
[im 5/29  bone]
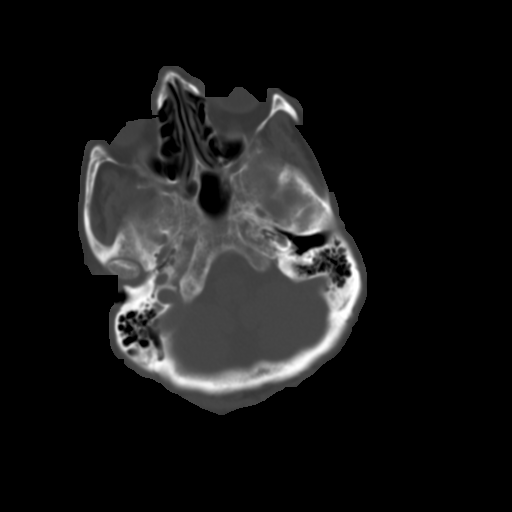
[im 10/29  brain]
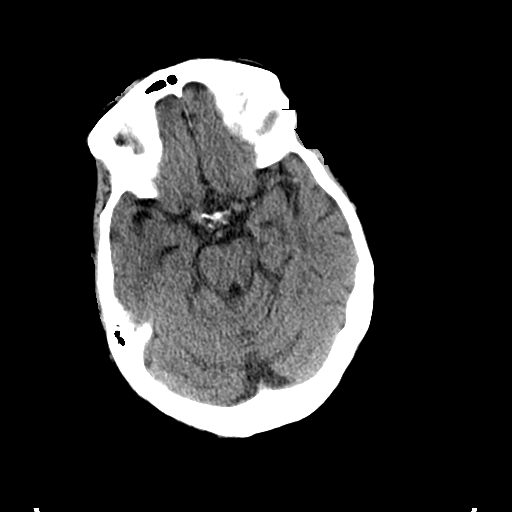
[im 15/29  brain]
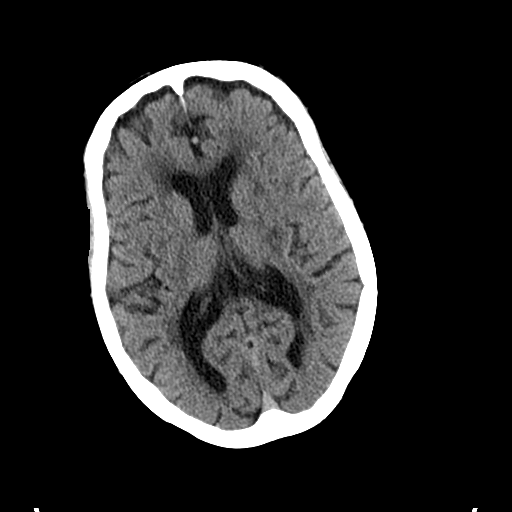
[im 19/29  brain]
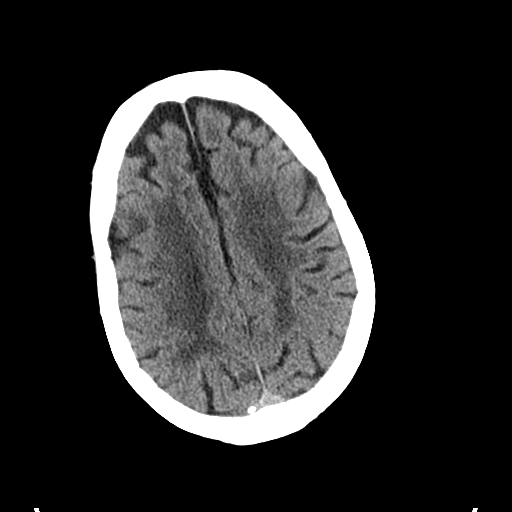
[im 24/29  brain]
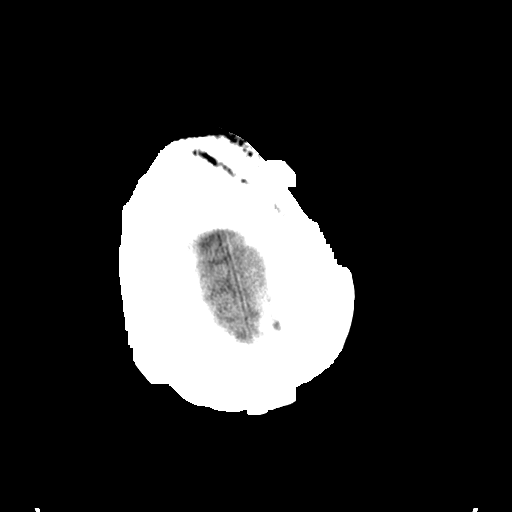
[im 24/29  bone]
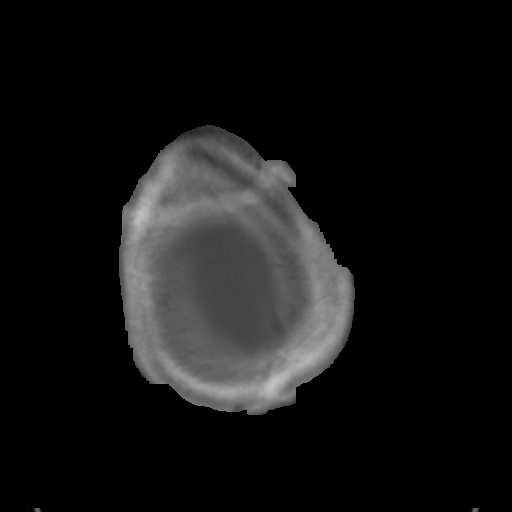

[Series 4: head bone · axial · 0.50mm/px · z∈[-121,-33]mm · 6 of 77 slices shown]
[im 9/77  bone]
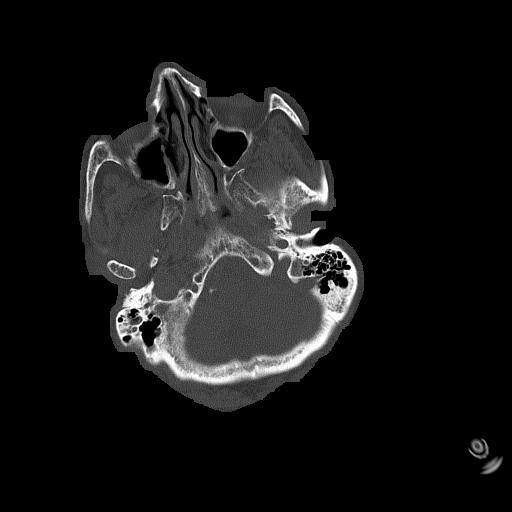
[im 17/77  bone]
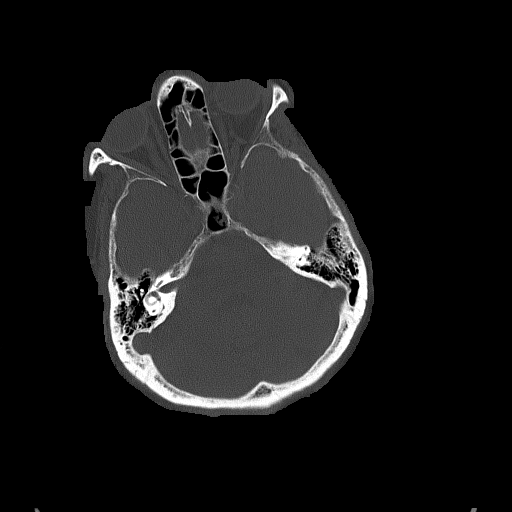
[im 25/77  bone]
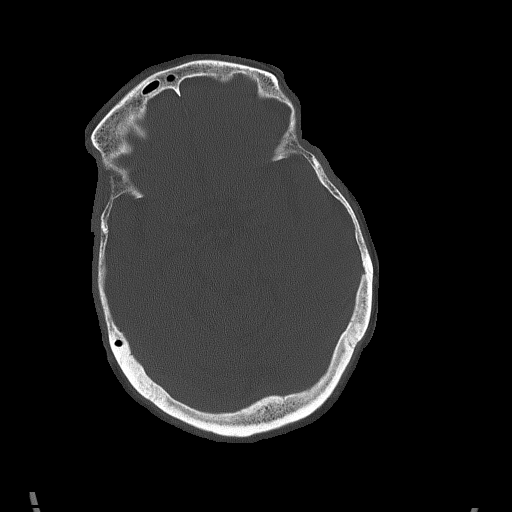
[im 33/77  bone]
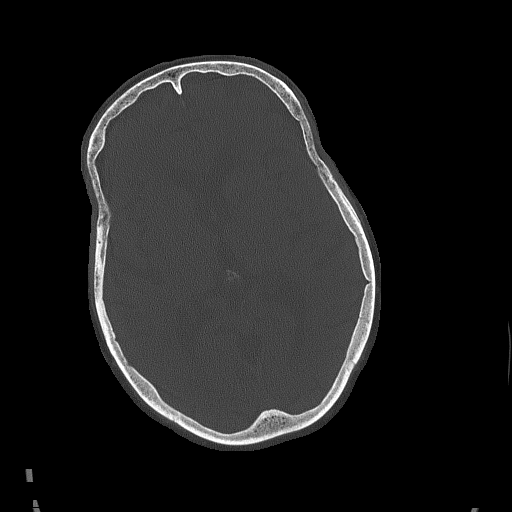
[im 45/77  bone]
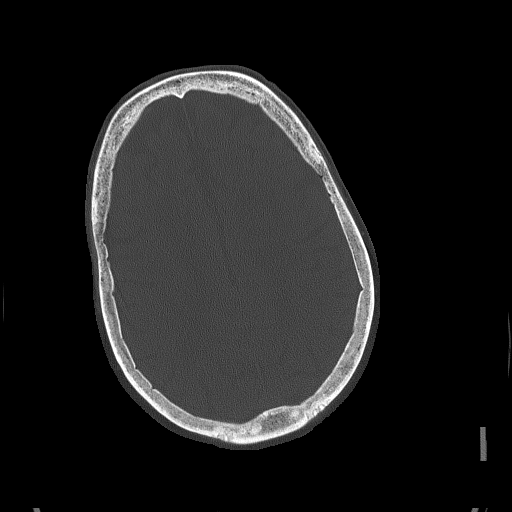
[im 53/77  bone]
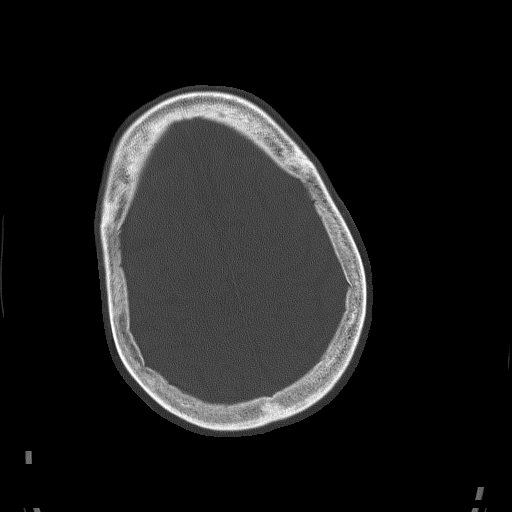

[Series 6: coronal soft · coronal · 0.32mm/px · 3 of 72 slices shown]
[im 18/72  brain]
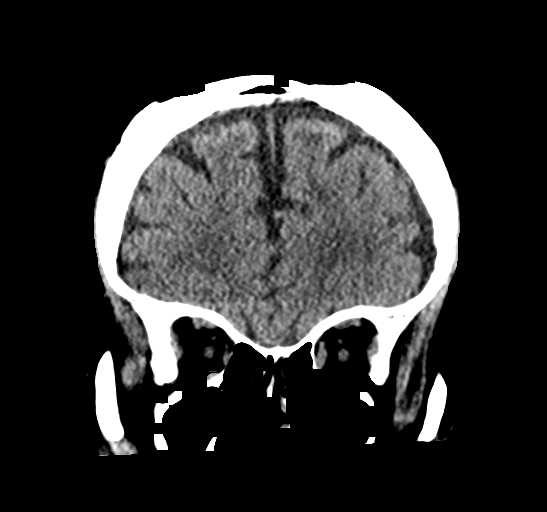
[im 36/72  brain]
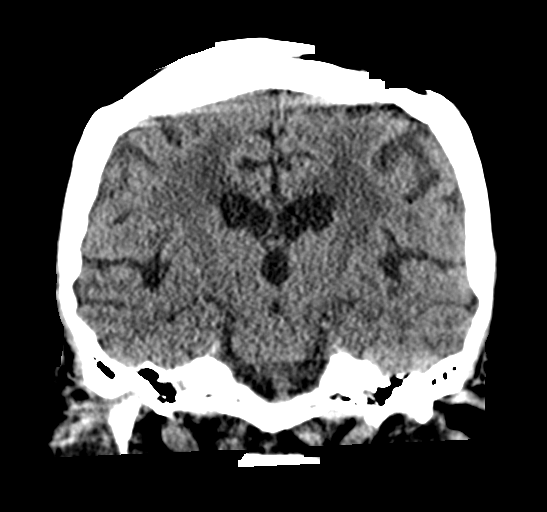
[im 54/72  brain]
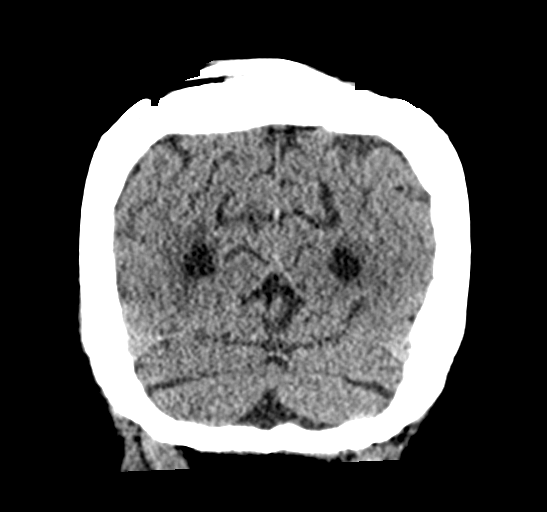

[Series 9: sagittal soft · sagittal · 0.31mm/px · 2 of 53 slices shown]
[im 18/53  brain]
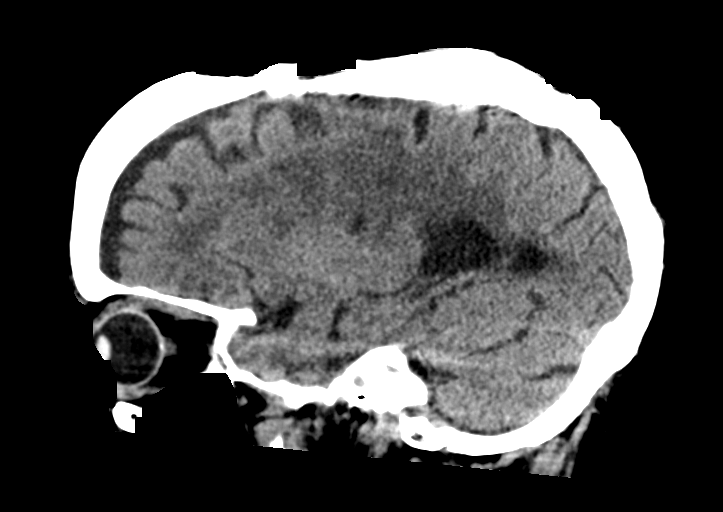
[im 35/53  brain]
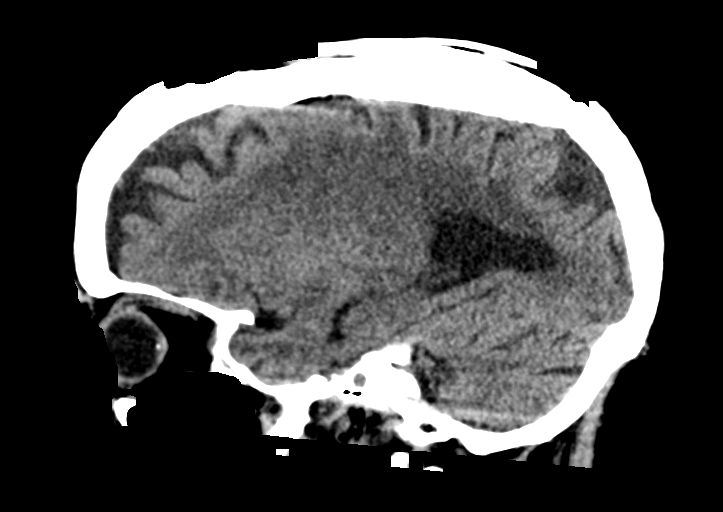

[16 of 47 positions shown; findings below may reference images not displayed]

FINDINGS: Brain: No evidence of acute infarction, hemorrhage, hydrocephalus,
extra-axial collection or mass lesion/mass effect. Extensive
periventricular and deep white matter hypodensity 6, markedly
increased compared to remote prior CT dated [ES].

Vascular: No hyperdense vessel or unexpected calcification.

Skull: Normal. Negative for fracture or focal lesion.

Sinuses/Orbits: No acute finding.

Other: None.
IMPRESSION: 1. No acute intracranial pathology.
2. Extensive periventricular and deep white matter hypodensity
markedly increased compared to remote prior CT dated [ES]. Findings
may reflect small-vessel white matter disease or alternately
demyelinating disorder such as multiple sclerosis. Correlate with
referable clinical history.

## 2019-08-05 IMAGING — DX DG CHEST 1V PORT
1 series · 1 of 1 positions shown · non-contrast
Comparison: Earlier on the same date, [DATE] hours

CLINICAL DATA: Endotracheal tube position post transport.

EXAM:
PORTABLE CHEST 1 VIEW

[chest ap]
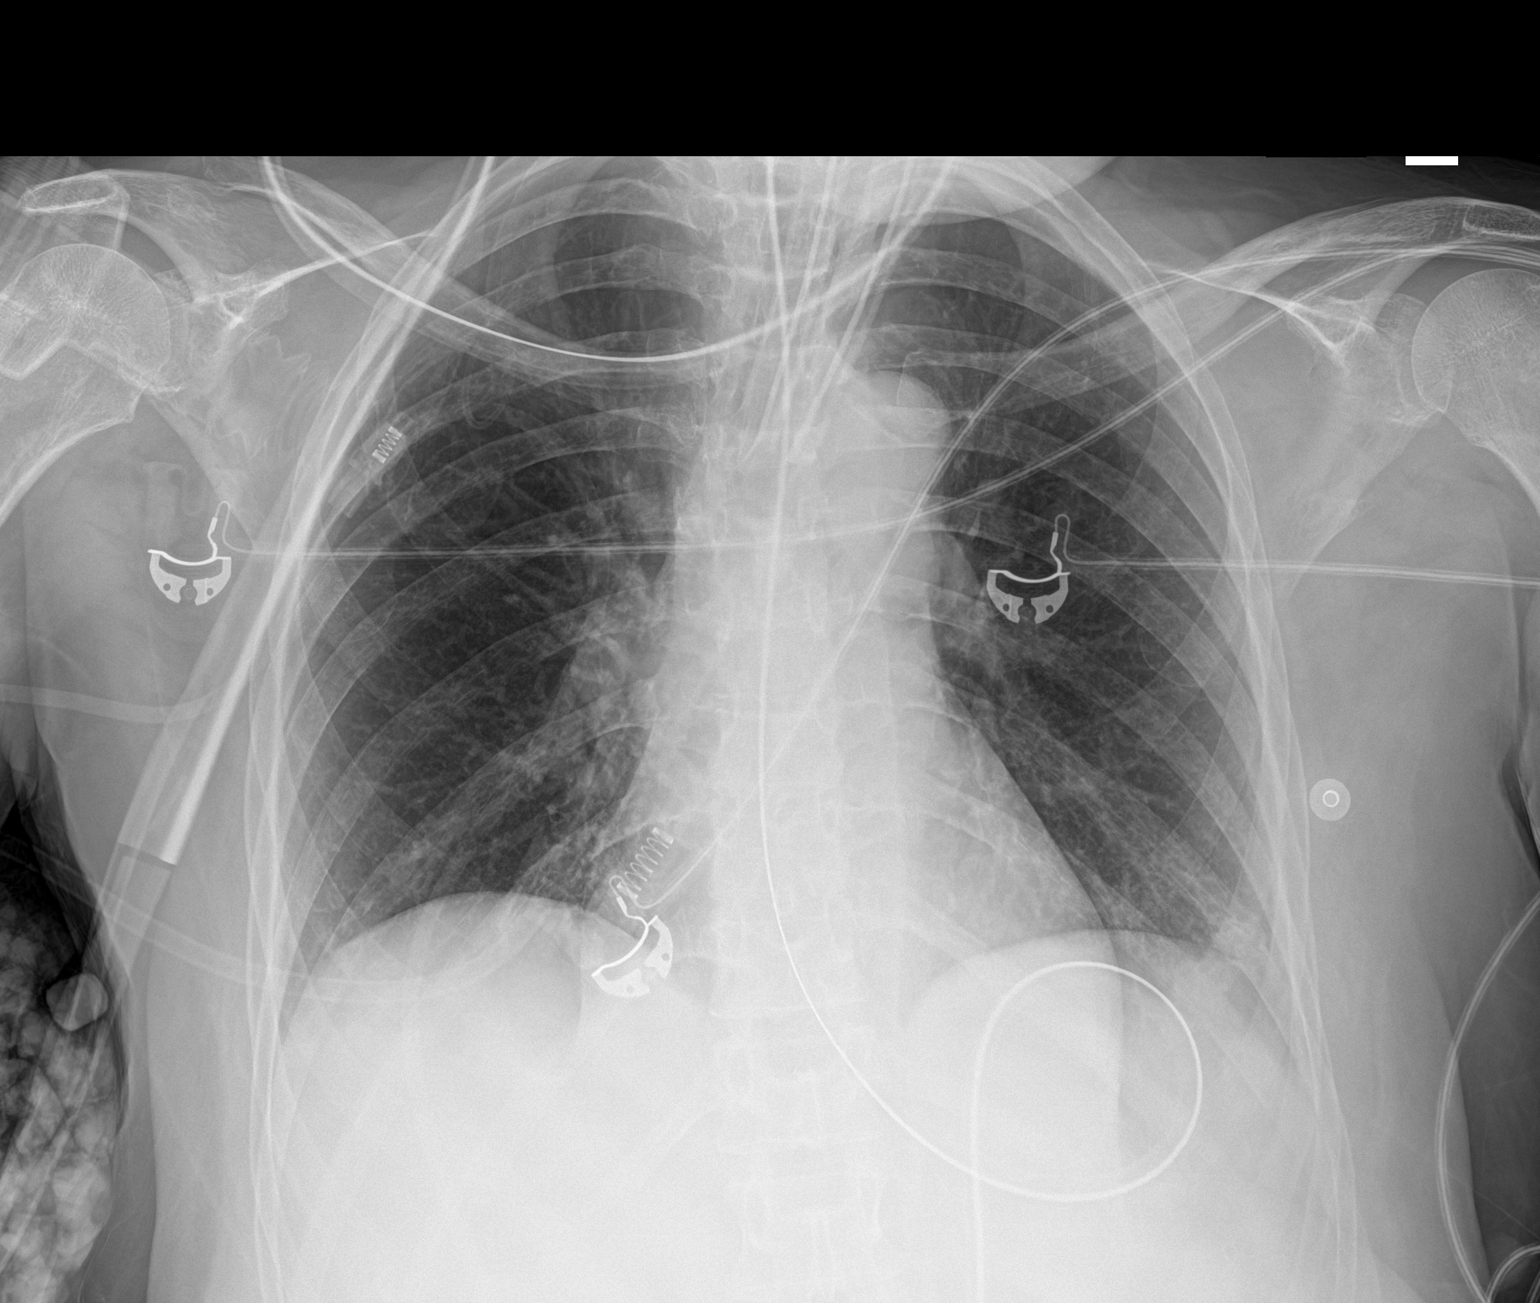

[1 of 1 positions shown; findings below may reference images not displayed]

FINDINGS: An endotracheal tube tip projects 1.4 cm above the carina, similar
to the prior study. An enteric tube none distal lumen continues to
loop in the region of the gastric fundus which is decompressed. Lung
inflation remain shallow. The cardiomediastinal silhouette remains
normal. There is no consolidation, pneumothorax, interstitial edema
or obvious pleural effusion. Persistent streaky interstitial
opacities in the bilateral infrahilar regions are likely
atelectatic. There are bone demineralization and mild degenerative
changes.
IMPRESSION: Stable ETT placement, its tip projecting 1.4 cm above the carina; a
2 cm retraction could be considered.

Similar shallow lung inflation with bilateral infrahilar
atelectasis. No pneumonia or pneumothorax.

## 2019-08-05 MED ORDER — HYDRALAZINE HCL 20 MG/ML IJ SOLN
10.0000 mg | INTRAMUSCULAR | Status: DC | PRN
  Administered 2019-08-06 – 2019-08-10 (×3): 10 mg via INTRAVENOUS
  Filled 2019-08-05 (×3): qty 1

## 2019-08-05 MED ORDER — DEXTROSE 50 % IV SOLN
INTRAVENOUS | Status: AC
  Administered 2019-08-05: 25 g
  Filled 2019-08-05: qty 50

## 2019-08-05 MED ORDER — LORAZEPAM 2 MG/ML IJ SOLN
INTRAMUSCULAR | Status: AC
  Filled 2019-08-05: qty 1

## 2019-08-05 MED ORDER — FENTANYL CITRATE (PF) 100 MCG/2ML IJ SOLN: 50.0000 ug | INTRAMUSCULAR | Status: DC | PRN

## 2019-08-05 MED ORDER — FENTANYL CITRATE (PF) 100 MCG/2ML IJ SOLN
50.0000 ug | INTRAMUSCULAR | Status: DC | PRN
  Filled 2019-08-05: qty 2

## 2019-08-05 MED ORDER — ORAL CARE MOUTH RINSE
15.0000 mL | OROMUCOSAL | Status: DC
  Administered 2019-08-06 – 2019-08-08 (×26): 15 mL via OROMUCOSAL

## 2019-08-05 MED ORDER — LEVETIRACETAM IN NACL 1000 MG/100ML IV SOLN
1000.0000 mg | Freq: Once | INTRAVENOUS | Status: AC
  Administered 2019-08-05: 1000 mg via INTRAVENOUS
  Filled 2019-08-05: qty 100

## 2019-08-05 MED ORDER — PROPOFOL 1000 MG/100ML IV EMUL
INTRAVENOUS | Status: AC
  Filled 2019-08-05: qty 100

## 2019-08-05 MED ORDER — BISACODYL 10 MG RE SUPP: 10.0000 mg | Freq: Every day | RECTAL | Status: DC | PRN

## 2019-08-05 MED ORDER — DEXTROSE 250 MG/ML IV SOLN: 25.0000 g | Freq: Once | INTRAVENOUS | Status: DC

## 2019-08-05 MED ORDER — THIAMINE HCL 100 MG/ML IJ SOLN
100.0000 mg | Freq: Every day | INTRAMUSCULAR | Status: DC
  Administered 2019-08-06 – 2019-08-08 (×4): 100 mg via INTRAVENOUS
  Filled 2019-08-05 (×4): qty 2

## 2019-08-05 MED ORDER — ROCURONIUM BROMIDE 50 MG/5ML IV SOLN
8.0000 mg | Freq: Once | INTRAVENOUS | Status: AC
  Administered 2019-08-05: 8 mg via INTRAVENOUS
  Filled 2019-08-05: qty 0.8

## 2019-08-05 MED ORDER — INSULIN ASPART 100 UNIT/ML ~~LOC~~ SOLN: 0.0000 [IU] | Freq: Four times a day (QID) | SUBCUTANEOUS | Status: DC

## 2019-08-05 MED ORDER — PROPOFOL 1000 MG/100ML IV EMUL
5.0000 ug/kg/min | INTRAVENOUS | Status: DC
  Administered 2019-08-05: 55 ug/kg/min via INTRAVENOUS
  Administered 2019-08-05: 5 ug/kg/min via INTRAVENOUS
  Filled 2019-08-05: qty 100

## 2019-08-05 MED ORDER — ETOMIDATE 2 MG/ML IV SOLN
20.0000 mg | Freq: Once | INTRAVENOUS | Status: AC
  Administered 2019-08-05: 20 mg via INTRAVENOUS

## 2019-08-05 MED ORDER — SODIUM CHLORIDE 0.9 % IV SOLN: INTRAVENOUS | Status: DC

## 2019-08-05 MED ORDER — HYDRALAZINE HCL 20 MG/ML IJ SOLN
INTRAMUSCULAR | Status: AC
  Filled 2019-08-05: qty 1

## 2019-08-05 MED ORDER — SENNOSIDES 8.8 MG/5ML PO SYRP
5.0000 mL | ORAL_SOLUTION | Freq: Two times a day (BID) | ORAL | Status: DC | PRN
  Filled 2019-08-05: qty 5

## 2019-08-05 MED ORDER — LORAZEPAM 2 MG/ML IJ SOLN
2.0000 mg | Freq: Once | INTRAMUSCULAR | Status: AC
  Administered 2019-08-05: 2 mg via INTRAVENOUS

## 2019-08-05 MED ORDER — SODIUM CHLORIDE 0.9 % IV BOLUS
500.0000 mL | Freq: Once | INTRAVENOUS | Status: AC
  Administered 2019-08-05: 500 mL via INTRAVENOUS

## 2019-08-05 MED ORDER — CHLORHEXIDINE GLUCONATE 0.12% ORAL RINSE (MEDLINE KIT)
15.0000 mL | Freq: Two times a day (BID) | OROMUCOSAL | Status: DC
  Administered 2019-08-05 – 2019-08-08 (×6): 15 mL via OROMUCOSAL

## 2019-08-05 MED ORDER — PROPOFOL 1000 MG/100ML IV EMUL
0.0000 ug/kg/min | INTRAVENOUS | Status: AC
  Administered 2019-08-06 (×3): 50 ug/kg/min via INTRAVENOUS
  Filled 2019-08-05 (×3): qty 100

## 2019-08-05 NOTE — Progress Notes (Signed)
E-link informed about peripheral IV's and Propofol running.  Patient seems to respond better to Versed and Fentanyl for asked if we could switch.  And concern about hand sized hematoma on right-sided mid to lower abdomen which is hard and bruise goes to back side.

## 2019-08-05 NOTE — ED Notes (Signed)
Pt suctioned by respiratory.  Clear mucous.

## 2019-08-05 NOTE — Progress Notes (Signed)
eLink Physician-Brief Progress Note Patient Name: BLUMA BURESH DOB: 1961/05/19 MRN: [id number]   Date of Service  08/05/2019  HPI/Events of Note  Pt comfortable on Propofol without evidence of ventricular dyssynchrony, Right flank bruise.  eICU Interventions  Portable CXR and KUB, will continue Propofol + PRN Fentanyl.        Thomasene Lot Ogan 08/05/2019, 11:35 PM

## 2019-08-05 NOTE — ED Notes (Addendum)
Breath sounds heard more on right side. ET tube pulled back to 24 cm.

## 2019-08-05 NOTE — Progress Notes (Signed)
The phone numbers for the husband [place] home [phone] and cell [phone] are invalid please, obtain contact number when he calls or visits.  Confirmed with [facility] ED as well unable to contact the spouse

## 2019-08-05 NOTE — Progress Notes (Signed)
Sputum sample collected and sent to the lab.  

## 2019-08-05 NOTE — ED Notes (Signed)
Date and time results received: 03/19/211452 (use smartphrase ".now" to insert current time)  Test: Lactic Acid Critical Value: 2.7 Name of Provider Notified:Dr Zackowski  Orders Received? Or Actions Taken?: NA

## 2019-08-05 NOTE — ED Notes (Signed)
Pt placed on vent by respiratory.  

## 2019-08-05 NOTE — ED Notes (Signed)
Pt significant other at bedside

## 2019-08-05 NOTE — ED Notes (Signed)
No new orders have been received- [facility] informed of this

## 2019-08-05 NOTE — ED Notes (Signed)
Pt transported to CT. RN and Respiratory remains with PT.

## 2019-08-05 NOTE — H&P (Signed)
NAME:  Cindy [name], MRN:  [id number], DOB:  Dec 29, 1960, LOS: 0 ADMISSION DATE:  08/05/2019, CONSULTATION DATE:  08/05/19 REFERRING MD:  Dr. Deretha Emory, CHIEF COMPLAINT:  AMS    Brief History   59 y/o F who presented to [facility] 3/19 with reports of altered mental status. Active seizures on arrival to ER.  Hx of polysubstance abuse.   History of present illness   58 y/o F who presented to [facility] on 3/19 via EMS with reports of AMS.    The patient was actively seizing on arrival to ER.  She was noted to have eye deviation to the right with twitching of the face / unresponsive. There were concerns of vomiting prior to arrival.  She was treated with IV ativan on arrival and keppra.  The patient was intubated for airway protection. UDS was noted to be cocaine positive.  Salicylate level <7, ETOH 21.  CXR with ETT in appropriate position, no infiltrate. Initial labs - Na 124, K 4, Cl 90, CO2 16, glucose 87, BUN 8, Sr Cr 1.08, AG 18, lipase 69, AST 57, ALT 25, BNP 121, troponin 33, WBC 10.9, Hgb 14.9 and platelets 338.    The patient was transferred to [facility] for further evaluation.   Past Medical History  CVA - 2005 HTN HLD  Abnormal LFT's  Polysubstance Abuse   Significant Hospital Events   3/19 Admit with AMS, active seizures on arrival, intubated   Consults:  PCCM   Procedures:  ETT 2/19 >>   Significant Diagnostic Tests:   CT Head 3/19 >> no acute intracranial pathology, extensive periventricular and deep white matter hypodensity markedly increased compared to remote prior CT 2006.    Micro Data:  UA 3/19 >> glucose >500, 5 ketones, 100 protein, rare bacteria, 0-5 WBC  Antimicrobials:  None  Interim history/subjective:  n/a  Objective   Blood pressure (!) 144/87, pulse 97, temperature 98.2 F (36.8 C), resp. rate 20, height 5\' 4"  (1.626 m), weight 59.3 kg, SpO2 100 %.    Vent Mode: PRVC FiO2 (%):  [100 %] 100 % Set Rate:  [20 bmp] 20 bmp Vt Set:  [430 mL] 430 mL PEEP:  [5  cmH20] 5 cmH20 Plateau Pressure:  [20 cmH20] 20 cmH20   Intake/Output Summary (Last 24 hours) at 08/05/2019 1402 Last data filed at 08/05/2019 1257 Gross per 24 hour  Intake 600 ml  Output --  Net 600 ml   Filed Weights   08/05/19 1143 08/05/19 1155  Weight: 68 kg 59.3 kg    Examination: General: intubated, sedated HEENT: MM pink/moist Neuro: not following commands after bolus of fentanyl, dose of versed just prior to our evaluation. Pupils pinpoint and sluggishly reactive, equal. CV: s1s2 , no m/r/g PULM:  CTAB, mechanical breath sounds GI: soft, bsx4 active  Extremities: warm/dry, no edema  Skin: no rashes or lesions  Resolved Hospital Problem list      Assessment & Plan:   Seizure  Acute Metabolic Encephalopathy  Alcohol withdrawal a possibility. Multiple metabolic derangements have either lowered threshold or precipitated SZ (awaiting ammonia level). Does have chart history of CVA and this may represent a focus for SZ activity.  -admit to ICU  -seizure precautions   -PAD protocol with propofol in setting of seizures with PRN Fentanyl  -pending results of remaining labs or onset of any further clinical seizure-like activity consider consult to neuro for consideration of LTM  Acute Respiratory Failure Intubation for airway protection but vomiting prior to arrival. -  PRVC 8cc/kg as rest mode -Wean PEEP / fiO2 for sats >90% -VAP prevention measures   Hyponatremia  In setting of ETOH abuse  -trend sodium  -UOsm, SOsm, TSH, cortisol AM  Polysubstance Abuse  Hx of ETOH + cocaine positive on admit  -CIWA protocol as above  -substance abuse counseling once able to participate   -thiamine  R flank mass  likely hematoma raising concern for abdominal/chest wall trauma. -hold DVT chemoppx for now, type/screen, trend CBC -CT C/A/P  Best practice:  Diet: NPO  Pain/Anxiety/Delirium protocol (if indicated): yes VAP protocol (if indicated): yes DVT prophylaxis: SCDs GI  prophylaxis: not indicated Glucose control: SSI Mobility: bed level Code Status: Full to be confirmed with family Family Communication: Attempting to establish contact but unable to reach husband so far Disposition: ICU  Labs   CBC: Recent Labs  Lab 08/05/19 1152 08/05/19 1200  WBC  --  10.9*  NEUTROABS  --  8.2*  HGB 15.6* 14.9  HCT 46.0 43.4  MCV  --  95.6  PLT  --  338    Basic Metabolic Panel: Recent Labs  Lab 08/05/19 1152 08/05/19 1200  NA 125* 124*  K 4.1 4.0  CL 94* 90*  CO2  --  16*  GLUCOSE 84 87  BUN 6 8  CREATININE 1.20* 1.05*  CALCIUM  --  9.2  MG  --  1.8   GFR: Estimated Creatinine Clearance: 49.2 mL/min (A) (by C-G formula based on SCr of 1.05 mg/dL (H)). Recent Labs  Lab 08/05/19 1200  WBC 10.9*    Liver Function Tests: Recent Labs  Lab 08/05/19 1200  AST 57*  ALT 25  ALKPHOS 93  BILITOT 1.1  PROT 8.1  ALBUMIN 3.7   Recent Labs  Lab 08/05/19 1200  LIPASE 69*   No results for input(s): AMMONIA in the last 168 hours.  ABG    Component Value Date/Time   PHART 7.372 08/05/2019 1310   PCO2ART 29.2 (L) 08/05/2019 1310   PO2ART 536 (H) 08/05/2019 1310   HCO3 18.8 (L) 08/05/2019 1310   TCO2 18 (L) 08/05/2019 1152   ACIDBASEDEF 7.6 (H) 08/05/2019 1310   O2SAT 99.8 08/05/2019 1310     Coagulation Profile: Recent Labs  Lab 08/05/19 1200  INR 1.0    Cardiac Enzymes: No results for input(s): CKTOTAL, CKMB, CKMBINDEX, TROPONINI in the last 168 hours.  HbA1C: Hgb A1c MFr Bld  Date/Time Value Ref Range Status  03/26/2015 09:21 AM 5.3 4.8 - 5.6 % Final    Comment:             Pre-diabetes: 5.7 - 6.4          Diabetes: >6.4          Glycemic control for adults with diabetes: <7.0     CBG: Recent Labs  Lab 08/05/19 1153  GLUCAP 76    Review of Systems:   Unable to complete due to AMS, intubation.   Past Medical History  She,  has a past medical history of Abnormal LFTs, CVA (cerebral vascular accident) (HCC)  (2005), Hyperlipidemia, Hypertension, and Polysubstance dependence including opioid type drug, episodic abuse (HCC).   Surgical History   History reviewed. No pertinent surgical history.   Social History   reports that she has never smoked. She has never used smokeless tobacco. She reports current alcohol use. She reports that she does not use drugs.   Family History   Her family history includes Asthma in her mother; Bronchitis in  her mother; Cancer in her father; Hypertension in her mother.   Allergies No Known Allergies   Home Medications  Prior to Admission medications   Medication Sig Start Date End Date Taking? Authorizing Provider  amLODipine (NORVASC) 10 MG tablet TAKE 1 TABLET BY MOUTH EVERY DAY (NEEDS TO MAKE AN APPOINTMENT) 04/12/18   Estill Dooms, NP  simvastatin (ZOCOR) 20 MG tablet Take 1 tablet (20 mg total) by mouth daily. 04/02/15   Estill Dooms, NP     Critical care time: 30    This patient is critically ill with acute encephalopathy requiring intubation for airway protection; which, requires frequent high complexity decision making, assessment, support, evaluation, and titration of therapies. This was completed through the application of advanced monitoring technologies and extensive interpretation of multiple databases. During this encounter critical care time was devoted to patient care services described in this note for 30 minutes.

## 2019-08-05 NOTE — ED Notes (Signed)
7.5 ET tube placed by Dr Deretha Emory, + color changed noted on CO2 detector, tip to lip 26 cm.

## 2019-08-05 NOTE — ED Notes (Signed)
Radiology at bedside

## 2019-08-05 NOTE — ED Provider Notes (Addendum)
[facility] EMERGENCY DEPARTMENT Provider Note   CSN: [id number] Arrival date & time: 08/05/19  1140     History Chief Complaint  Patient presents with  . Altered Mental Status    Cindy [name] is a 59 y.o. female.  Patient brought in by EMS.  According to patient's spouse patient started seizing about 8:00 this morning.  Patient arrived here around 1130.  Patient was still actively seizing.  With twitching of the face eyes deviated to the right.  Significant twitching of the right arm.  Lower extremities without any twitching left upper extremity without any twitching.  Patient not responsive.  Patient received 2 mg of Ativan seizing continued.  Keppra load was started.  Decision was made to intubate.  Patient has a known history of alcohol abuse.  Patient did do some vomiting just prior to intubation.        Past Medical History:  Diagnosis Date  . Abnormal LFTs    Hx of  . CVA (cerebral vascular accident) (HCC) 2005  . Hyperlipidemia   . Hypertension   . Polysubstance dependence including opioid type drug, episodic abuse [facility])    cocaine/etoh     Patient Active Problem List   Diagnosis Date Noted  . TRANSAMINASES, SERUM, ELEVATED 10/23/2009  . HYPERCHOLESTEROLEMIA 07/03/2008  . ALCOHOL USE 07/03/2008  . Essential hypertension 07/03/2008  . STROKE 07/03/2008  . DYSPHAGIA UNSPECIFIED 07/03/2008  . DIABETES MELLITUS, BORDERLINE 07/03/2008  . Pain in joint, shoulder region 06/22/2008  . SPONDYLOSIS 06/22/2008  . CERVICALGIA 06/22/2008  . SPINAL STENOSIS 06/22/2008  . CERVICAL SPASM 06/22/2008    History reviewed. No pertinent surgical history.   OB History    Gravida  2   Para  0   Term      Preterm      AB  2   Living        SAB  2   TAB      Ectopic      Multiple      Live Births              Family History  Problem Relation Age of Onset  . Cancer Father        prostate   . Hypertension Mother   . Asthma Mother   . Bronchitis  Mother     Social History   Tobacco Use  . Smoking status: Never Smoker  . Smokeless tobacco: Never Used  Substance Use Topics  . Alcohol use: Yes    Comment: Drinks three 40 ounces beers 3 days per week , other days none to one 40 ounce beer per day  . Drug use: No    Types: Cocaine    Comment: not now    Home Medications Prior to Admission medications   Medication Sig Start Date End Date Taking? Authorizing Provider  amLODipine (NORVASC) 10 MG tablet TAKE 1 TABLET BY MOUTH EVERY DAY (NEEDS TO MAKE AN APPOINTMENT) 04/12/18   Adline Potter, NP  simvastatin (ZOCOR) 20 MG tablet Take 1 tablet (20 mg total) by mouth daily. 04/02/15   Adline Potter, NP    Allergies    Patient has no known allergies.  Review of Systems   Review of Systems  Unable to perform ROS: Patient unresponsive    Physical Exam Updated Vital Signs BP (!) 130/102   Pulse (!) 108   Temp 98.8 F (37.1 C) (Oral)   Resp 20   Ht 1.626 m (5\' 4" )  Wt 59.3 kg   SpO2 100%   BMI 22.43 kg/m   Physical Exam Vitals and nursing note reviewed.  Constitutional:      General: She is in acute distress.     Appearance: She is well-developed. She is diaphoretic.  HENT:     Head: Normocephalic and atraumatic.  Eyes:     Comments: Pupils pinpoint.  Cardiovascular:     Rate and Rhythm: Regular rhythm. Tachycardia present.     Heart sounds: No murmur.  Pulmonary:     Effort: Pulmonary effort is normal. No respiratory distress.     Breath sounds: Normal breath sounds.  Abdominal:     Palpations: Abdomen is soft.     Tenderness: There is no abdominal tenderness.  Musculoskeletal:        General: Normal range of motion.     Cervical back: Normal range of motion and neck supple.     Right lower leg: No edema.     Left lower leg: No edema.  Skin:    General: Skin is warm.  Neurological:     Comments: Patient unresponsive.  Twitching of the face twitching in the right upper extremity.  Eyes  deviated to the right.     ED Results / Procedures / Treatments   Labs (all labs ordered are listed, but only abnormal results are displayed) Labs Reviewed  COMPREHENSIVE METABOLIC PANEL - Abnormal; Notable for the following components:      Result Value   Sodium 124 (*)    Chloride 90 (*)    CO2 16 (*)    Creatinine, Ser 1.05 (*)    AST 57 (*)    GFR calc non Af Amer 58 (*)    Anion gap 18 (*)    All other components within normal limits  CBC WITH DIFFERENTIAL/PLATELET - Abnormal; Notable for the following components:   WBC 10.9 (*)    Neutro Abs 8.2 (*)    All other components within normal limits  LIPASE, BLOOD - Abnormal; Notable for the following components:   Lipase 69 (*)    All other components within normal limits  ETHANOL - Abnormal; Notable for the following components:   Alcohol, Ethyl (B) 21 (*)    All other components within normal limits  BRAIN NATRIURETIC PEPTIDE - Abnormal; Notable for the following components:   B Natriuretic Peptide 121.0 (*)    All other components within normal limits  URINALYSIS, ROUTINE W REFLEX MICROSCOPIC - Abnormal; Notable for the following components:   APPearance HAZY (*)    Glucose, UA >=500 (*)    Hgb urine dipstick SMALL (*)    Ketones, ur 5 (*)    Protein, ur 100 (*)    Bacteria, UA RARE (*)    All other components within normal limits  RAPID URINE DRUG SCREEN, HOSP PERFORMED - Abnormal; Notable for the following components:   Cocaine POSITIVE (*)    All other components within normal limits  ACETAMINOPHEN LEVEL - Abnormal; Notable for the following components:   Acetaminophen (Tylenol), Serum <10 (*)    All other components within normal limits  SALICYLATE LEVEL - Abnormal; Notable for the following components:   Salicylate Lvl <8.5 (*)    All other components within normal limits  BLOOD GAS, ARTERIAL - Abnormal; Notable for the following components:   pCO2 arterial 29.2 (*)    pO2, Arterial 536 (*)    Bicarbonate  18.8 (*)    Acid-base deficit 7.6 (*)  Allens test (pass/fail) RIGHT BRACHIAL (*)    All other components within normal limits  I-STAT CHEM 8, ED - Abnormal; Notable for the following components:   Sodium 125 (*)    Chloride 94 (*)    Creatinine, Ser 1.20 (*)    Calcium, Ion 1.02 (*)    TCO2 18 (*)    Hemoglobin 15.6 (*)    All other components within normal limits  RESPIRATORY PANEL BY RT PCR (FLU A&B, COVID)  PROTIME-INR  MAGNESIUM  CBG MONITORING, ED  TROPONIN I (HIGH SENSITIVITY)    EKG EKG Interpretation  Date/Time:  Friday August 05 2019 12:08:09 EDT Ventricular Rate:  143 PR Interval:    QRS Duration: 90 QT Interval:  290 QTC Calculation: 448 R Axis:   40 Text Interpretation: Sinus tachycardia Consider right atrial enlargement Borderline repolarization abnormality Confirmed by Vanetta Mulders [phone]) on 08/05/2019 12:22:31 PM   Radiology CT Head Wo Contrast  Result Date: 08/05/2019 CLINICAL DATA:  AMS, seizures EXAM: CT HEAD WITHOUT CONTRAST TECHNIQUE: Contiguous axial images were obtained from the base of the skull through the vertex without intravenous contrast. COMPARISON:  05/29/2004 FINDINGS: Brain: No evidence of acute infarction, hemorrhage, hydrocephalus, extra-axial collection or mass lesion/mass effect. Extensive periventricular and deep white matter hypodensity 6, markedly increased compared to remote prior CT dated 2006. Vascular: No hyperdense vessel or unexpected calcification. Skull: Normal. Negative for fracture or focal lesion. Sinuses/Orbits: No acute finding. Other: None. IMPRESSION: 1. No acute intracranial pathology. 2. Extensive periventricular and deep white matter hypodensity markedly increased compared to remote prior CT dated 2006. Findings may reflect small-vessel white matter disease or alternately demyelinating disorder such as multiple sclerosis. Correlate with referable clinical history. Electronically Signed   By: Lauralyn Primes M.D.   On:  08/05/2019 13:18   DG Chest Portable 1 View  Result Date: 08/05/2019 CLINICAL DATA:  Tube placement, post intubation EXAM: PORTABLE CHEST 1 VIEW COMPARISON:  02/13/2012 FINDINGS: Intubation of the right mainstem bronchus. Endotracheal tube is approximately 2 cm beyond the carina into the right mainstem bronchus. Film is rotated. Accounting for this cardiomediastinal contours are normal. Hazy opacity throughout the left chest likely reflects hypoinflation due to right mainstem intubation though subtle areas of airspace opacity are considered. No evidence of pleural effusion. Hyperexpansion of the right chest. Gastric tube in the distal stomach, side port well below GE junction. Tip off the field of the radiograph. Visualized skeletal structures are unremarkable. IMPRESSION: 1. Intubation of the right mainstem bronchus with hypoinflation of the left chest. Difficult to exclude areas of airspace disease in the left chest. Suggest retraction of the endotracheal tube approximately 3 cm with repeat radiography to assess further. 2. Increased aeration of the right chest related to above. 3. Critical Value/emergent results were called by telephone at the time of interpretation on 08/05/2019 at 12:28 pm to provider Vanetta Mulders , who verbally acknowledged these results. Electronically Signed   By: Donzetta Kohut M.D.   On: 08/05/2019 12:28    Procedures Procedure Name: Intubation Date/Time: 08/05/2019 2:13 PM Performed by: Vanetta Mulders, MD Pre-anesthesia Checklist: Patient identified, Emergency Drugs available, Suction available, Patient being monitored and Timeout performed Oxygen Delivery Method: Ambu bag Preoxygenation: Pre-oxygenation with 100% oxygen Induction Type: Rapid sequence Ventilation: Mask ventilation without difficulty Laryngoscope Size: Glidescope and 3 Tube size: 7.5 mm Placement Confirmation: ETT inserted through vocal cords under direct vision,  CO2 detector and Breath sounds  checked- equal and bilateral Secured at: 26 cm Tube secured with: ETT  holder      (including critical care time)  CRITICAL CARE Performed by: Vanetta Mulders Total critical care time: 45 minutes Critical care time was exclusive of separately billable procedures and treating other patients. Critical care was necessary to treat or prevent imminent or life-threatening deterioration. Critical care was time spent personally by me on the following activities: development of treatment plan with patient and/or surrogate as well as nursing, discussions with consultants, evaluation of patient's response to treatment, examination of patient, obtaining history from patient or surrogate, ordering and performing treatments and interventions, ordering and review of laboratory studies, ordering and review of radiographic studies, pulse oximetry and re-evaluation of patient's condition.   Medications Ordered in ED Medications  0.9 %  sodium chloride infusion ( Intravenous New Bag/Given 08/05/19 1217)  propofol (DIPRIVAN) 1000 MG/100ML infusion (35 mcg/kg/min  59.3 kg Intravenous Rate/Dose Change 08/05/19 1310)  LORazepam (ATIVAN) injection 2 mg (2 mg Intravenous Given 08/05/19 1143)  levETIRAcetam (KEPPRA) IVPB 1000 mg/100 mL premix (0 mg Intravenous Stopped 08/05/19 1207)  etomidate (AMIDATE) injection 20 mg (20 mg Intravenous Given 08/05/19 1157)  rocuronium (ZEMURON) injection 8 mg (8 mg Intravenous Given 08/05/19 1158)  dextrose 50 % solution (25 g  Given 08/05/19 1157)  LORazepam (ATIVAN) injection 2 mg (2 mg Intravenous Given 08/05/19 1209)  sodium chloride 0.9 % bolus 500 mL (0 mLs Intravenous Stopped 08/05/19 1257)    ED Course  I have reviewed the triage vital signs and the nursing notes.  Pertinent labs & imaging results that were available during my care of the patient were reviewed by me and considered in my medical decision making (see chart for details).    MDM Rules/Calculators/A&P                        Intubation went smoothly.  Except for patient did some vomiting of some coffee-ground type material just prior to intubation.  No evidence of any coffee-ground getting down the trachea.  Following intubation patient was given an additional 2 mg of Ativan.  RBC received 2 mg and also had had a Keppra load.  Patient started on propofol drip.  Head CT negative pH on blood gas 7.37 which was reassuring.  Urine drug screen positive for cocaine.  Also alcohol level was 21.  So could represent alcohol withdrawal seizure.  Labs otherwise without any significant abnormalities other than mild hyponatremia.  But probably not enough to explain the seizure.  I discussed with Dr. Gerilyn Pilgrim neurology here.  Felt patient did not qualify to stay here.  When patient came back from head CT and no significant head CT findings.  Chest x-ray showed the endotracheal tube to be down.  Was pulled back 3 cm repeat chest x-ray was ordered..  Seems to be moving lower extremities spontaneously.  So I think the seizure activity is probably ended.  Discussed with critical care at [facility] they will admit to the ICU there.    Final Clinical Impression(s) / ED Diagnoses Final diagnoses:  Status epilepticus (HCC)  Hypoglycemia    Rx / DC Orders ED Discharge Orders    None       Vanetta Mulders, MD 08/05/19 1337    Vanetta Mulders, MD 08/05/19 1425

## 2019-08-05 NOTE — Progress Notes (Signed)
Attempted to call Husband cary at home number and cell.  Left a message with my direct number to call back this evening.   Upon arrival patient started to become restless.  Care link gave fentanyl 50 mcg and versed 2.5 mg via IVP prior to leaving.

## 2019-08-05 NOTE — ED Triage Notes (Signed)
Pt brought to ED via [facility] for AMS, pt actively seizing upon arrival to ED. Per EMS, pt with hx of alcohol abuse, no history of seizures. Dr Deretha Emory at bedside.

## 2019-08-05 NOTE — Progress Notes (Signed)
eLink Physician-Brief Progress Note Patient Name: ARAYLA KRUSCHKE DOB: 04-10-1961 MRN: [id number]   Date of Service  08/05/2019  HPI/Events of Note  Pt transferred from [facility] ED with a diagnosis of status epilepticus, acute respiratory failure on the ventilator , and a history of ETOH  and cocaine abuse.  eICU Interventions  New Patient Evaluation completed.        Thomasene Lot Jenya Putz 08/05/2019, 10:11 PM

## 2019-08-05 NOTE — ED Notes (Signed)
E.Link: [phone] notified of BP- informed PRN BP orders will be placed. CARElink at bedside.

## 2019-08-06 ENCOUNTER — Inpatient Hospital Stay (HOSPITAL_COMMUNITY): Payer: Self-pay

## 2019-08-06 ENCOUNTER — Other Ambulatory Visit: Payer: Self-pay

## 2019-08-06 ENCOUNTER — Other Ambulatory Visit (HOSPITAL_COMMUNITY): Payer: Self-pay

## 2019-08-06 DIAGNOSIS — K920 Hematemesis: Secondary | ICD-10-CM

## 2019-08-06 DIAGNOSIS — J9601 Acute respiratory failure with hypoxia: Secondary | ICD-10-CM

## 2019-08-06 DIAGNOSIS — J9600 Acute respiratory failure, unspecified whether with hypoxia or hypercapnia: Secondary | ICD-10-CM

## 2019-08-06 DIAGNOSIS — G40901 Epilepsy, unspecified, not intractable, with status epilepticus: Principal | ICD-10-CM

## 2019-08-06 LAB — CBC
HCT: 22.8 % — ABNORMAL LOW (ref 36.0–46.0)
HCT: 31.1 % — ABNORMAL LOW (ref 36.0–46.0)
HCT: 36.5 % (ref 36.0–46.0)
Hemoglobin: 8.1 g/dL — ABNORMAL LOW (ref 12.0–15.0)
Hemoglobin: 10.7 g/dL — ABNORMAL LOW (ref 12.0–15.0)
Hemoglobin: 12.2 g/dL (ref 12.0–15.0)
MCH: 33.6 pg (ref 26.0–34.0)
MCH: 32.3 pg (ref 26.0–34.0)
MCH: 33.2 pg (ref 26.0–34.0)
MCHC: 35.5 g/dL (ref 30.0–36.0)
MCHC: 34.4 g/dL (ref 30.0–36.0)
MCHC: 33.4 g/dL (ref 30.0–36.0)
MCV: 94.6 fL (ref 80.0–100.0)
MCV: 94 fL (ref 80.0–100.0)
MCV: 99.2 fL (ref 80.0–100.0)
Platelets: 162 10*3/uL (ref 150–400)
Platelets: 172 10*3/uL (ref 150–400)
Platelets: 169 10*3/uL (ref 150–400)
RBC: 2.41 MIL/uL — ABNORMAL LOW (ref 3.87–5.11)
RBC: 3.31 MIL/uL — ABNORMAL LOW (ref 3.87–5.11)
RBC: 3.68 MIL/uL — ABNORMAL LOW (ref 3.87–5.11)
RDW: 12 % (ref 11.5–15.5)
RDW: 12.2 % (ref 11.5–15.5)
RDW: 12.5 % (ref 11.5–15.5)
WBC: 7.5 10*3/uL (ref 4.0–10.5)
WBC: 6.9 10*3/uL (ref 4.0–10.5)
WBC: 10.1 10*3/uL (ref 4.0–10.5)
nRBC: 0 % (ref 0.0–0.2)
nRBC: 0 % (ref 0.0–0.2)
nRBC: 0 % (ref 0.0–0.2)

## 2019-08-06 LAB — COMPREHENSIVE METABOLIC PANEL
ALT: 33 U/L (ref 0–44)
AST: 79 U/L — ABNORMAL HIGH (ref 15–41)
Albumin: 2.9 g/dL — ABNORMAL LOW (ref 3.5–5.0)
Alkaline Phosphatase: 73 U/L (ref 38–126)
Anion gap: 17 — ABNORMAL HIGH (ref 5–15)
BUN: 11 mg/dL (ref 6–20)
CO2: 14 mmol/L — ABNORMAL LOW (ref 22–32)
Calcium: 8.8 mg/dL — ABNORMAL LOW (ref 8.9–10.3)
Chloride: 96 mmol/L — ABNORMAL LOW (ref 98–111)
Creatinine, Ser: 1.17 mg/dL — ABNORMAL HIGH (ref 0.44–1.00)
GFR calc Af Amer: 59 mL/min — ABNORMAL LOW (ref >60)
GFR calc non Af Amer: 51 mL/min — ABNORMAL LOW (ref >60)
Glucose, Bld: 78 mg/dL (ref 70–99)
Potassium: 3.9 mmol/L (ref 3.5–5.1)
Sodium: 127 mmol/L — ABNORMAL LOW (ref 135–145)
Total Bilirubin: 1.6 mg/dL — ABNORMAL HIGH (ref 0.3–1.2)
Total Protein: 6.6 g/dL (ref 6.5–8.1)

## 2019-08-06 LAB — PHOSPHORUS: Phosphorus: 2.9 mg/dL (ref 2.5–4.6)

## 2019-08-06 LAB — CBC WITH DIFFERENTIAL/PLATELET
Abs Immature Granulocytes: 0.05 10*3/uL (ref 0.00–0.07)
Basophils Absolute: 0 10*3/uL (ref 0.0–0.1)
Basophils Relative: 0 %
Eosinophils Absolute: 0.1 10*3/uL (ref 0.0–0.5)
Eosinophils Relative: 1 %
HCT: 35.4 % — ABNORMAL LOW (ref 36.0–46.0)
Hemoglobin: 12.4 g/dL (ref 12.0–15.0)
Immature Granulocytes: 0 %
Lymphocytes Relative: 4 %
Lymphs Abs: 0.5 10*3/uL — ABNORMAL LOW (ref 0.7–4.0)
MCH: 33.1 pg (ref 26.0–34.0)
MCHC: 35 g/dL (ref 30.0–36.0)
MCV: 94.4 fL (ref 80.0–100.0)
Monocytes Absolute: 1.1 10*3/uL — ABNORMAL HIGH (ref 0.1–1.0)
Monocytes Relative: 9 %
Neutro Abs: 10 10*3/uL — ABNORMAL HIGH (ref 1.7–7.7)
Neutrophils Relative %: 86 %
Platelets: 229 10*3/uL (ref 150–400)
RBC: 3.75 MIL/uL — ABNORMAL LOW (ref 3.87–5.11)
RDW: 12.2 % (ref 11.5–15.5)
WBC: 11.7 10*3/uL — ABNORMAL HIGH (ref 4.0–10.5)
nRBC: 0 % (ref 0.0–0.2)

## 2019-08-06 LAB — GLUCOSE, CAPILLARY
Glucose-Capillary: 100 mg/dL — ABNORMAL HIGH (ref 70–99)
Glucose-Capillary: 103 mg/dL — ABNORMAL HIGH (ref 70–99)
Glucose-Capillary: 117 mg/dL — ABNORMAL HIGH (ref 70–99)
Glucose-Capillary: 211 mg/dL — ABNORMAL HIGH (ref 70–99)
Glucose-Capillary: 53 mg/dL — ABNORMAL LOW (ref 70–99)
Glucose-Capillary: 69 mg/dL — ABNORMAL LOW (ref 70–99)
Glucose-Capillary: 87 mg/dL (ref 70–99)
Glucose-Capillary: 97 mg/dL (ref 70–99)

## 2019-08-06 LAB — LACTIC ACID, PLASMA
Lactic Acid, Venous: 1.3 mmol/L (ref 0.5–1.9)
Lactic Acid, Venous: 1.5 mmol/L (ref 0.5–1.9)

## 2019-08-06 LAB — BASIC METABOLIC PANEL
Anion gap: 13 (ref 5–15)
Anion gap: 11 (ref 5–15)
BUN: 9 mg/dL (ref 6–20)
BUN: 9 mg/dL (ref 6–20)
CO2: 19 mmol/L — ABNORMAL LOW (ref 22–32)
CO2: 19 mmol/L — ABNORMAL LOW (ref 22–32)
Calcium: 8.1 mg/dL — ABNORMAL LOW (ref 8.9–10.3)
Calcium: 8.4 mg/dL — ABNORMAL LOW (ref 8.9–10.3)
Chloride: 97 mmol/L — ABNORMAL LOW (ref 98–111)
Chloride: 98 mmol/L (ref 98–111)
Creatinine, Ser: 1.06 mg/dL — ABNORMAL HIGH (ref 0.44–1.00)
Creatinine, Ser: 1.04 mg/dL — ABNORMAL HIGH (ref 0.44–1.00)
GFR calc Af Amer: >60 mL/min (ref >60)
GFR calc Af Amer: >60 mL/min (ref >60)
GFR calc non Af Amer: 57 mL/min — ABNORMAL LOW (ref >60)
GFR calc non Af Amer: 58 mL/min — ABNORMAL LOW (ref >60)
Glucose, Bld: 94 mg/dL (ref 70–99)
Glucose, Bld: 211 mg/dL — ABNORMAL HIGH (ref 70–99)
Potassium: 4.1 mmol/L (ref 3.5–5.1)
Potassium: 3 mmol/L — ABNORMAL LOW (ref 3.5–5.1)
Sodium: 129 mmol/L — ABNORMAL LOW (ref 135–145)
Sodium: 128 mmol/L — ABNORMAL LOW (ref 135–145)

## 2019-08-06 LAB — TYPE AND SCREEN
ABO/RH(D): A POS
Antibody Screen: NEGATIVE

## 2019-08-06 LAB — TROPONIN I (HIGH SENSITIVITY)
Troponin I (High Sensitivity): 43 ng/L — ABNORMAL HIGH (ref <18)
Troponin I (High Sensitivity): 72 ng/L — ABNORMAL HIGH (ref <18)
Troponin I (High Sensitivity): 74 ng/L — ABNORMAL HIGH (ref <18)

## 2019-08-06 LAB — ABO/RH: ABO/RH(D): A POS

## 2019-08-06 LAB — TRIGLYCERIDES: Triglycerides: 73 mg/dL (ref <150)

## 2019-08-06 LAB — MRSA PCR SCREENING: MRSA by PCR: NEGATIVE

## 2019-08-06 LAB — AMMONIA: Ammonia: 30 umol/L (ref 9–35)

## 2019-08-06 LAB — CORTISOL-AM, BLOOD: Cortisol - AM: 6.2 ug/dL — ABNORMAL LOW (ref 6.7–22.6)

## 2019-08-06 LAB — MAGNESIUM: Magnesium: 1.5 mg/dL — ABNORMAL LOW (ref 1.7–2.4)

## 2019-08-06 LAB — OSMOLALITY, URINE: Osmolality, Ur: 490 mOsm/kg (ref 300–900)

## 2019-08-06 LAB — ECHOCARDIOGRAM COMPLETE
Height: 64 in
Weight: 2088.2 oz

## 2019-08-06 LAB — OSMOLALITY: Osmolality: 277 mOsm/kg (ref 275–295)

## 2019-08-06 LAB — HIV ANTIBODY (ROUTINE TESTING W REFLEX): HIV Screen 4th Generation wRfx: NONREACTIVE

## 2019-08-06 LAB — T4, FREE: Free T4: 1.14 ng/dL — ABNORMAL HIGH (ref 0.61–1.12)

## 2019-08-06 LAB — TSH: TSH: 7.598 u[IU]/mL — ABNORMAL HIGH (ref 0.350–4.500)

## 2019-08-06 IMAGING — CT CT ABD-PELV W/ CM
2 of 5 series · 14 of 36 positions shown, 17 images · IV contrast (omnipaque)
Comparison: None.

CLINICAL DATA: Fall. Cocaine use. Abdominal trauma.

EXAM:
CT CHEST, ABDOMEN, AND PELVIS WITH CONTRAST
TECHNIQUE: Multidetector CT imaging of the chest, abdomen and pelvis was
performed following the standard protocol during bolus
administration of intravenous contrast.
CONTRAST:  100mL OMNIPAQUE IOHEXOL 300 MG/ML  SOLN

[Series 3: cap with 5mm st · axial · 0.87mm/px · z∈[+967,+1482]mm · 11 of 125 slices shown, 14 images]
[im 11/125  mediastinal]
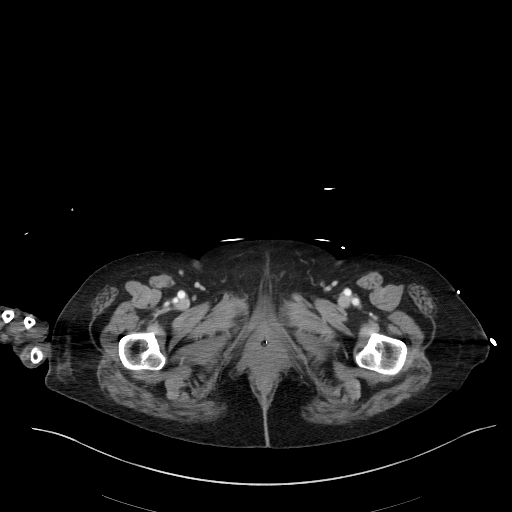
[im 11/125  lung]
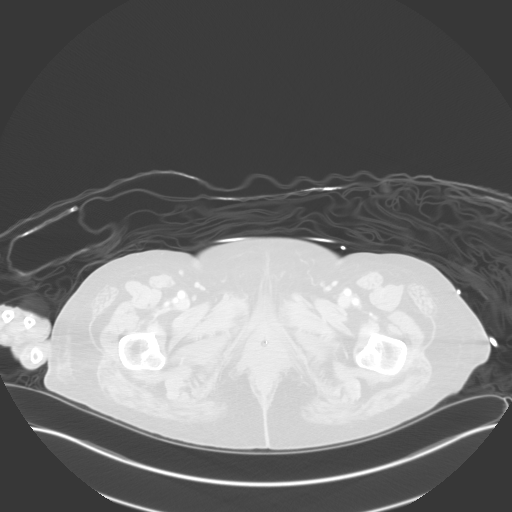
[im 21/125  lung]
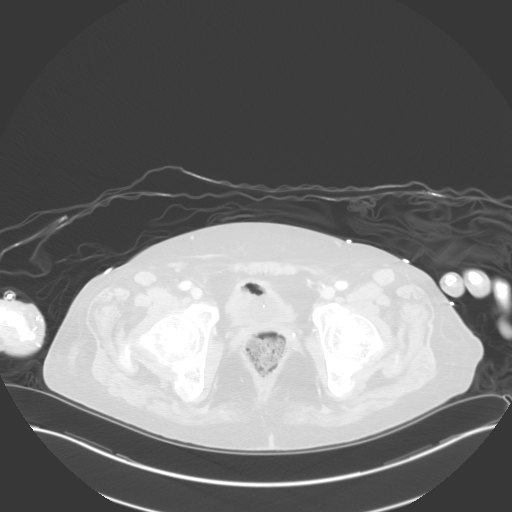
[im 32/125  lung]
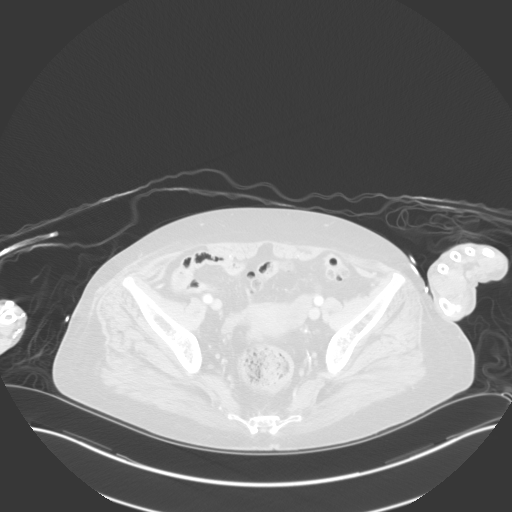
[im 42/125  lung]
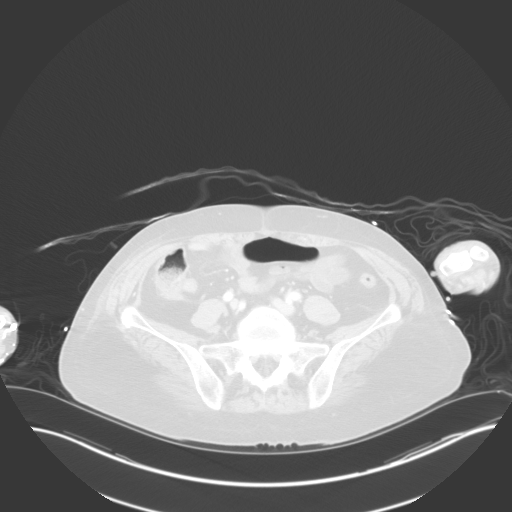
[im 52/125  mediastinal]
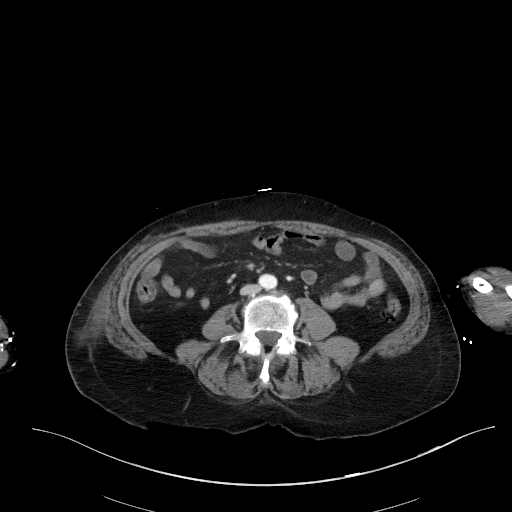
[im 52/125  lung]
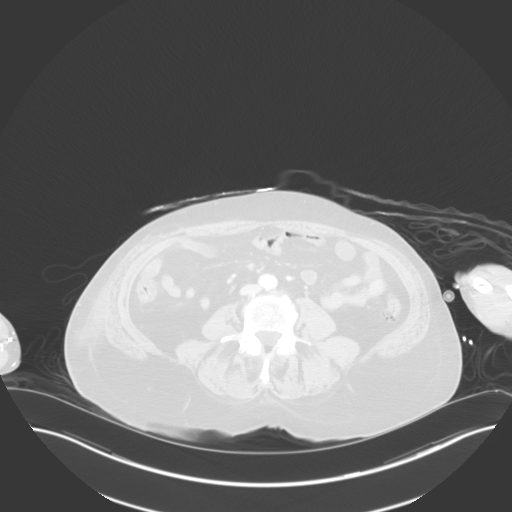
[im 63/125  lung]
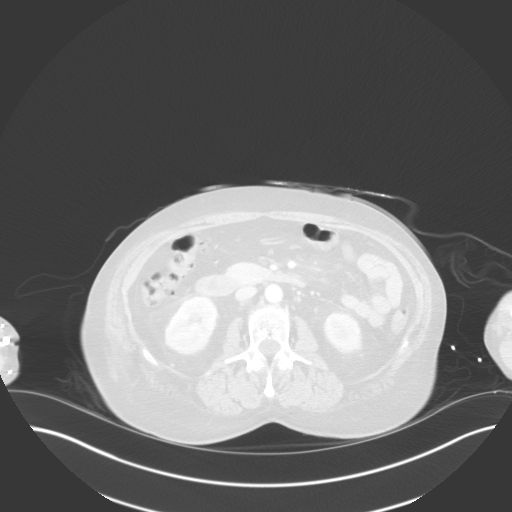
[im 73/125  lung]
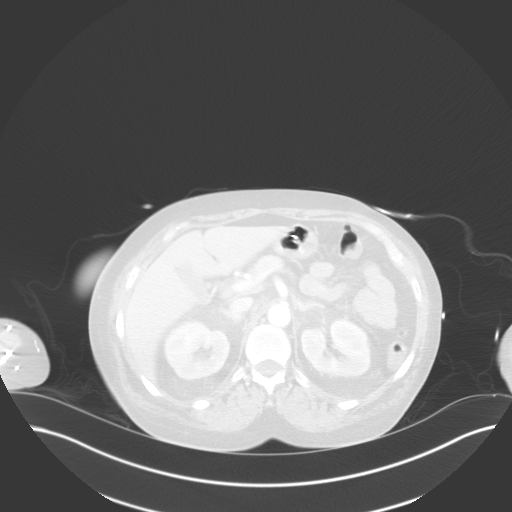
[im 83/125  lung]
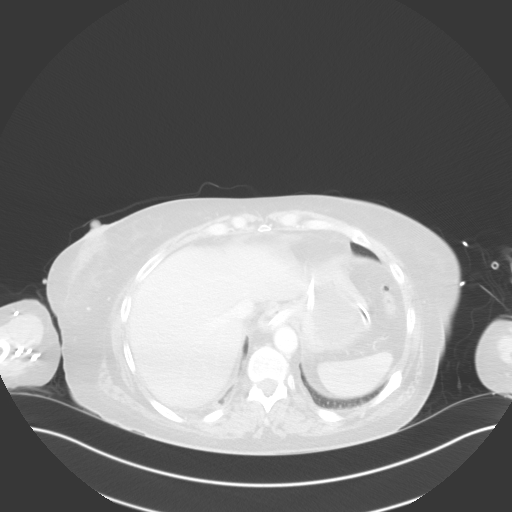
[im 94/125  mediastinal]
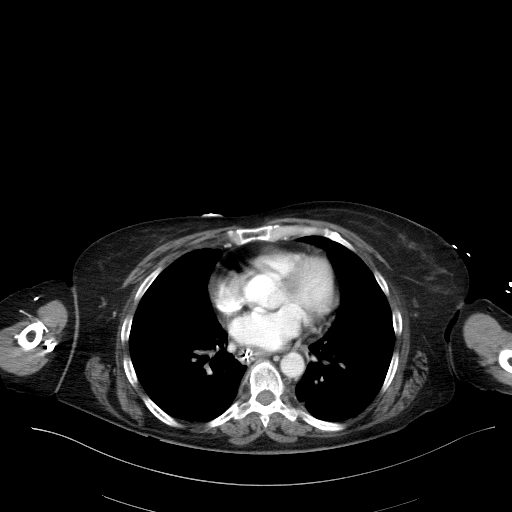
[im 94/125  lung]
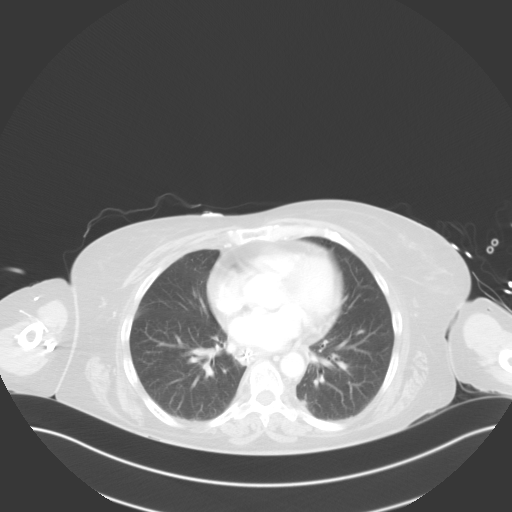
[im 104/125  lung]
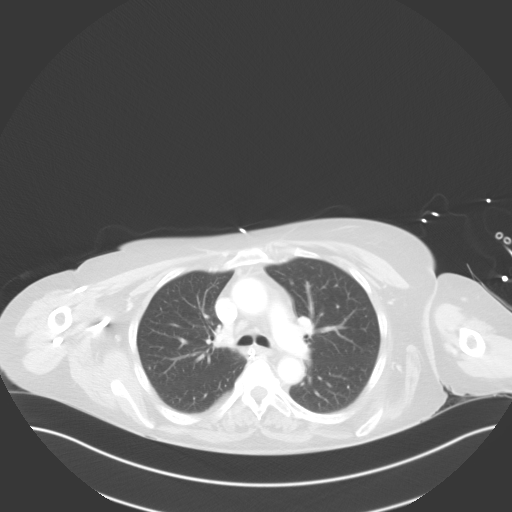
[im 114/125  lung]
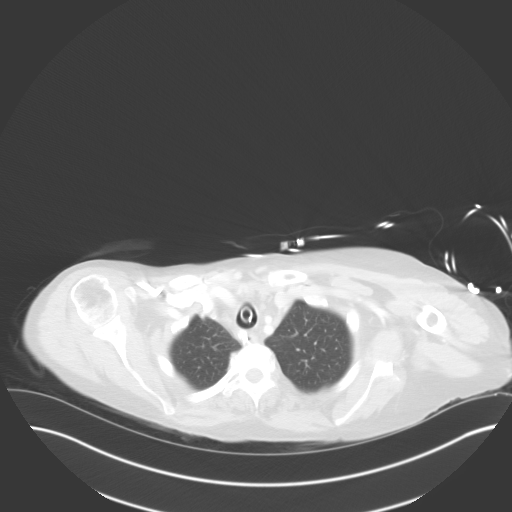

[Series 6: cap with 3mm st cor · coronal · 0.82mm/px · 3 of 146 slices shown]
[im 30/146  lung]
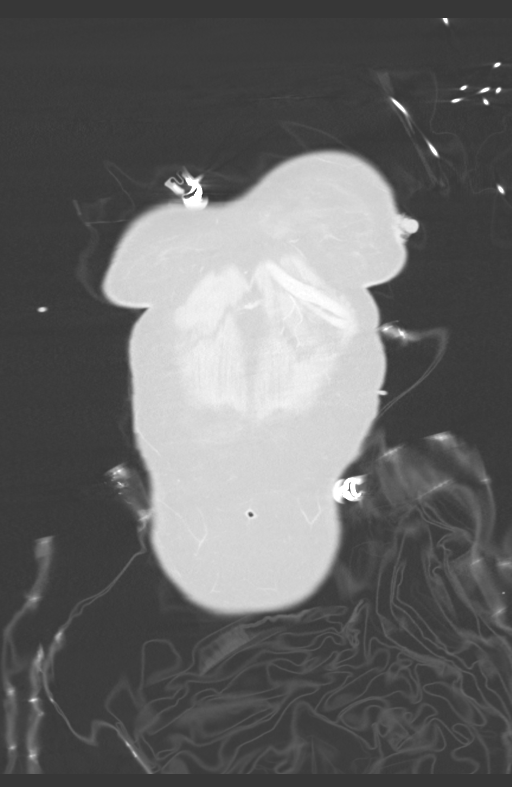
[im 59/146  lung]
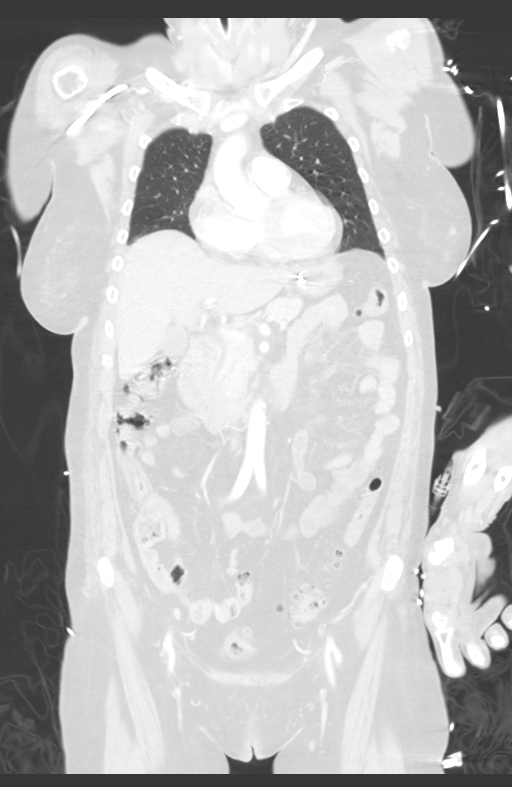
[im 88/146  lung]
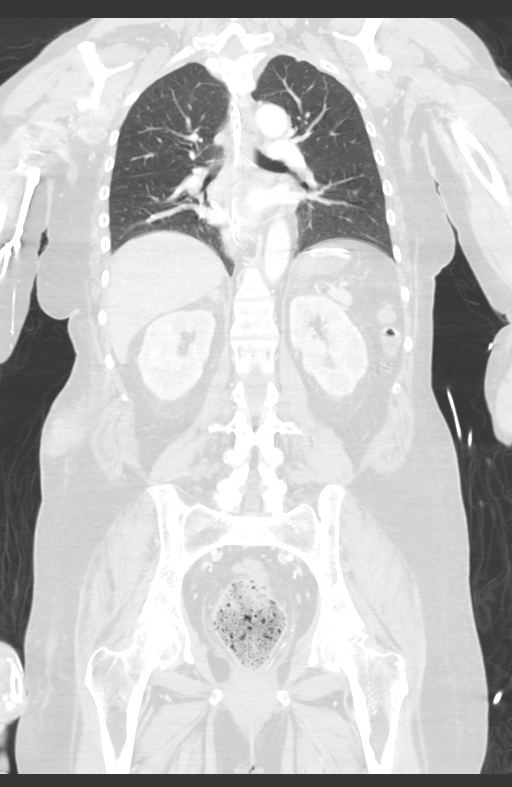

[14 of 36 positions shown; findings below may reference images not displayed]

FINDINGS: CT CHEST FINDINGS

Cardiovascular: Heart size is normal without pericardial effusion.
The thoracic aorta is normal in course and caliber without
dissection, aneurysm, ulceration or intramural hematoma.

Mediastinum/Nodes: No mediastinal hematoma. No mediastinal, hilar or
axillary lymphadenopathy. The visualized thyroid and thoracic
esophageal course are unremarkable.

Lungs/Pleura: No pulmonary contusion, pneumothorax or pleural
effusion. Endotracheal tube tip is below the thoracic inlet but
above the carina.

Musculoskeletal: No acute fracture of the ribs, sternum for the
visible portions of clavicles and scapulae.

CT ABDOMEN PELVIS FINDINGS

Hepatobiliary: No hepatic hematoma or laceration. No biliary
dilatation. Normal gallbladder.

Pancreas: Normal contours without ductal dilatation. No
peripancreatic fluid collection.

Spleen: No splenic laceration or hematoma.

Adrenals/Urinary Tract:

--Adrenal glands: No adrenal hemorrhage.

--Right kidney/ureter: No hydronephrosis or perinephric hematoma.

--Left kidney/ureter: No hydronephrosis or perinephric hematoma.

--Urinary bladder: Unremarkable.

Stomach/Bowel:

--Stomach/Duodenum: No hiatal hernia or other gastric abnormality.
Normal duodenal course and caliber.

--Small bowel: No dilatation or inflammation.

--Colon: No focal abnormality.

--Appendix: Not visualized. No right lower quadrant inflammation or
free fluid.

Vascular/Lymphatic: Normal course and caliber of the major abdominal
vessels. No abdominal or pelvic lymphadenopathy.

Reproductive: Normal uterus and ovaries.

Musculoskeletal. No pelvic fractures.

Other: Superficial right lateral abdominal hematoma measures 5.3 x
3.1 cm.
IMPRESSION: 1. No acute abnormality of the chest, abdomen or pelvis.
2. Superficial right lateral abdominal wall hematoma.

## 2019-08-06 MED ORDER — FENTANYL CITRATE (PF) 100 MCG/2ML IJ SOLN
50.0000 ug | Freq: Once | INTRAMUSCULAR | Status: AC
  Administered 2019-08-06: 50 ug via INTRAVENOUS

## 2019-08-06 MED ORDER — POTASSIUM CHLORIDE 10 MEQ/100ML IV SOLN
10.0000 meq | INTRAVENOUS | Status: DC
  Administered 2019-08-06: 10 meq via INTRAVENOUS
  Filled 2019-08-06: qty 100

## 2019-08-06 MED ORDER — PANTOPRAZOLE SODIUM 40 MG IV SOLR
40.0000 mg | Freq: Two times a day (BID) | INTRAVENOUS | Status: DC
  Administered 2019-08-06 – 2019-08-08 (×5): 40 mg via INTRAVENOUS
  Filled 2019-08-06 (×5): qty 40

## 2019-08-06 MED ORDER — POTASSIUM CHLORIDE 10 MEQ/50ML IV SOLN: 10.0000 meq | INTRAVENOUS | Status: DC

## 2019-08-06 MED ORDER — FENTANYL 2500MCG IN NS 250ML (10MCG/ML) PREMIX INFUSION
50.0000 ug/h | INTRAVENOUS | Status: DC
  Administered 2019-08-06: 50 ug/h via INTRAVENOUS
  Administered 2019-08-07: 100 ug/h via INTRAVENOUS
  Filled 2019-08-06 (×2): qty 250

## 2019-08-06 MED ORDER — DEXMEDETOMIDINE HCL IN NACL 400 MCG/100ML IV SOLN
0.0000 ug/kg/h | INTRAVENOUS | Status: DC
  Administered 2019-08-06: 0.4 ug/kg/h via INTRAVENOUS
  Administered 2019-08-07: 0.3 ug/kg/h via INTRAVENOUS
  Filled 2019-08-06 (×2): qty 100

## 2019-08-06 MED ORDER — FOLIC ACID 5 MG/ML IJ SOLN
1.0000 mg | Freq: Every day | INTRAMUSCULAR | Status: DC
  Administered 2019-08-06 – 2019-08-08 (×3): 1 mg via INTRAVENOUS
  Filled 2019-08-06 (×4): qty 0.2

## 2019-08-06 MED ORDER — LORAZEPAM 2 MG/ML IJ SOLN
1.0000 mg | INTRAMUSCULAR | Status: DC | PRN
  Administered 2019-08-08 – 2019-08-09 (×5): 2 mg via INTRAVENOUS
  Filled 2019-08-06 (×6): qty 1

## 2019-08-06 MED ORDER — FENTANYL BOLUS VIA INFUSION
50.0000 ug | INTRAVENOUS | Status: DC | PRN
  Administered 2019-08-07 – 2019-08-08 (×2): 50 ug via INTRAVENOUS
  Filled 2019-08-06: qty 50

## 2019-08-06 MED ORDER — FOLIC ACID 1 MG PO TABS: 1.0000 mg | ORAL_TABLET | Freq: Every day | ORAL | Status: DC

## 2019-08-06 MED ORDER — IOHEXOL 300 MG/ML  SOLN
100.0000 mL | Freq: Once | INTRAMUSCULAR | Status: AC | PRN
  Administered 2019-08-06: 100 mL via INTRAVENOUS

## 2019-08-06 MED ORDER — CHLORHEXIDINE GLUCONATE CLOTH 2 % EX PADS: 6.0000 | MEDICATED_PAD | Freq: Every day | CUTANEOUS | Status: DC

## 2019-08-06 MED ORDER — ALBUMIN HUMAN 5 % IV SOLN
12.5000 g | Freq: Once | INTRAVENOUS | Status: AC
  Administered 2019-08-06: 12.5 g via INTRAVENOUS
  Filled 2019-08-06: qty 250

## 2019-08-06 MED ORDER — SODIUM CHLORIDE 0.9 % IV SOLN
500.0000 mg | INTRAVENOUS | Status: DC
  Administered 2019-08-06 – 2019-08-07 (×2): 500 mg via INTRAVENOUS
  Filled 2019-08-06 (×3): qty 500

## 2019-08-06 MED ORDER — POTASSIUM CHLORIDE 10 MEQ/100ML IV SOLN
10.0000 meq | INTRAVENOUS | Status: AC
  Administered 2019-08-06 (×3): 10 meq via INTRAVENOUS
  Filled 2019-08-06 (×3): qty 100

## 2019-08-06 MED ORDER — INSULIN ASPART 100 UNIT/ML ~~LOC~~ SOLN
0.0000 [IU] | SUBCUTANEOUS | Status: DC
  Administered 2019-08-06: 3 [IU] via SUBCUTANEOUS
  Administered 2019-08-07 – 2019-08-09 (×6): 1 [IU] via SUBCUTANEOUS
  Administered 2019-08-09: 2 [IU] via SUBCUTANEOUS
  Administered 2019-08-10 (×2): 1 [IU] via SUBCUTANEOUS
  Administered 2019-08-10: 2 [IU] via SUBCUTANEOUS
  Administered 2019-08-10: 1 [IU] via SUBCUTANEOUS
  Administered 2019-08-10: 2 [IU] via SUBCUTANEOUS
  Administered 2019-08-11 – 2019-08-16 (×19): 1 [IU] via SUBCUTANEOUS
  Administered 2019-08-17: 2 [IU] via SUBCUTANEOUS
  Administered 2019-08-17 – 2019-08-19 (×5): 1 [IU] via SUBCUTANEOUS

## 2019-08-06 MED ORDER — CHLORHEXIDINE GLUCONATE CLOTH 2 % EX PADS
6.0000 | MEDICATED_PAD | Freq: Every day | CUTANEOUS | Status: DC
  Administered 2019-08-06 – 2019-08-17 (×10): 6 via TOPICAL

## 2019-08-06 MED ORDER — DEXTROSE 50 % IV SOLN
25.0000 g | INTRAVENOUS | Status: AC
  Administered 2019-08-06: 25 g via INTRAVENOUS

## 2019-08-06 MED ORDER — LACTATED RINGERS IV BOLUS
500.0000 mL | Freq: Once | INTRAVENOUS | Status: AC
  Administered 2019-08-06: 500 mL via INTRAVENOUS

## 2019-08-06 MED ORDER — ADULT MULTIVITAMIN W/MINERALS CH
1.0000 | ORAL_TABLET | Freq: Every day | ORAL | Status: DC
  Administered 2019-08-07 – 2019-08-08 (×2): 1 via ORAL
  Filled 2019-08-06 (×2): qty 1

## 2019-08-06 MED ORDER — DEXTROSE 50 % IV SOLN
INTRAVENOUS | Status: AC
  Filled 2019-08-06: qty 50

## 2019-08-06 MED ORDER — DEXTROSE IN LACTATED RINGERS 5 % IV SOLN: INTRAVENOUS | Status: DC

## 2019-08-06 MED ORDER — SODIUM CHLORIDE 0.9 % IV SOLN
1.0000 g | INTRAVENOUS | Status: DC
  Administered 2019-08-06 – 2019-08-07 (×2): 1 g via INTRAVENOUS
  Filled 2019-08-06: qty 10
  Filled 2019-08-06 (×2): qty 1

## 2019-08-06 MED ORDER — MAGNESIUM SULFATE 2 GM/50ML IV SOLN
2.0000 g | Freq: Once | INTRAVENOUS | Status: AC
  Administered 2019-08-06: 2 g via INTRAVENOUS
  Filled 2019-08-06: qty 50

## 2019-08-06 NOTE — Progress Notes (Signed)
Patient transported on ventilator to CT via bed with 2 RN's and RT.  Patient off the floor from 430 to 0505.  Patient upon return had coffee ground emesis coming from oral suctioning and endotracheal tube.  Elink MD notified via [facility] RN

## 2019-08-06 NOTE — Progress Notes (Signed)
Pt transported from 2M06 to CT and back with no complications.

## 2019-08-06 NOTE — Plan of Care (Signed)
Patient had low blood sugar of 53 with being NPO and no tube feeding at this time.  IVF with dextrose started no evidence of learning at this time

## 2019-08-06 NOTE — Progress Notes (Signed)
NAME:  Cindy [name], MRN:  [id number], DOB:  1961-04-23, LOS: 1 ADMISSION DATE:  08/05/2019, CONSULTATION DATE:  08/05/19 REFERRING MD:  Dr. Deretha Emory, CHIEF COMPLAINT:  AMS    Brief History   59 y/o F who presented to [facility] 3/19 with reports of altered mental status. Active seizures on arrival to ER.  Hx of polysubstance abuse.   History of present illness   59 y/o F who presented to [facility] on 3/19 via EMS with reports of AMS.    The patient was actively seizing on arrival to ER.  She was noted to have eye deviation to the right with twitching of the face / unresponsive. There were concerns of vomiting prior to arrival.  She was treated with IV ativan on arrival and keppra.  The patient was intubated for airway protection. UDS was noted to be cocaine positive.  Salicylate level <7, ETOH 21.  CXR with ETT in appropriate position, no infiltrate. Initial labs - Na 124, K 4, Cl 90, CO2 16, glucose 87, BUN 8, Sr Cr 1.08, AG 18, lipase 69, AST 57, ALT 25, BNP 121, troponin 33, WBC 10.9, Hgb 14.9 and platelets 338.    The patient was transferred to [facility] for [facility] evaluation.   Past Medical History  CVA - 2005 HTN HLD  Abnormal LFT's  Polysubstance Abuse   Significant Hospital Events   3/19 Admit with AMS, active seizures on arrival, intubated   Consults:  PCCM   Procedures:  ETT 2/19 >>   Significant Diagnostic Tests:   CT Head 3/19 >> no acute intracranial pathology, extensive periventricular and deep white matter hypodensity markedly increased compared to remote prior CT 2006.    Micro Data:  UA 3/19 >> glucose >500, 5 ketones, 100 protein, rare bacteria, 0-5 WBC Resp culture 3/19>Few GPC  Antimicrobials:  None  Interim history/subjective:  Remains sedated and on mechanical ventilation. Hypoglycemic and started on IVF. Also concern about coffee ground emesis  Objective   Blood pressure 125/76, pulse (!) 108, temperature (!) 101.5 F (38.6 C), resp. rate 17, height 5\' 4"  (1.626  m), weight 59.2 kg, SpO2 100 %.    Vent Mode: PRVC FiO2 (%):  [30 %-100 %] 30 % Set Rate:  [14 bmp-20 bmp] 14 bmp Vt Set:  [430 mL] 430 mL PEEP:  [5 cmH20] 5 cmH20 Plateau Pressure:  [14 cmH20-20 cmH20] 15 cmH20   Intake/Output Summary (Last 24 hours) at 08/06/2019 0942 Last data filed at 08/06/2019 0900 Gross per 24 hour  Intake 2689.51 ml  Output 475 ml  Net 2214.51 ml   Filed Weights   08/05/19 1155 08/05/19 2215 08/06/19 0331  Weight: 59.3 kg 59.2 kg 59.2 kg   Physical Exam: General: Chronically ill-appearing, sedated HENT: [place], AT, ETT in place Eyes: EOMI, no scleral icterus Respiratory: Diminished breath sounds bilaterally.  No crackles, wheezing or rales Cardiovascular: RRR, -M/R/G, no JVD GI: BS+, soft, nontender Skin: Multiple bruises and indurated right flank hematoma noted Extremities:-Edema,-tenderness Neuro: Sedated, pinpoint pupils   Resolved Hospital Problem list      Assessment & Plan:   Seizure  Acute Metabolic Encephalopathy  Alcohol withdrawal a possibility. Multiple metabolic derangements have either lowered threshold or precipitated SZ. UDS +cocaine. Remaining tox panel negative. Ammonia wnl. Does have chart history of CVA and this may represent a focus for SZ activity. CT head with no acute intracranial abnormalities -seizure precautions   -EEG -PAD protocol: Transition from propofol to fentanyl and Precedex  Acute Respiratory Failure  Intubation for airway protection but vomiting prior to arrival. -Full vent support. PRVC 8cc/kg as rest mode -Wean PEEP / fiO2 for sats >90% -Daily WUA/SBT. Extubation precluded by multiple active medical issues -On Ceftriaxone and azithro -VAP   Coffee ground emesis -PPI BID -Trend CBC q6h -Started Ceftriaxone empirically  Hyponatremia, appears hypovolemic In setting of ETOH abuse  -LR bolus -Trend BMP  AKI with metabolic acidosis -Monitor UOP/Cr  Polysubstance Abuse  Hx of ETOH + cocaine positive on  admit  -CIWA protocol as above  -substance abuse counseling once able to participate   -thiamine, folic acid and multivitamin  R flank mass  likely hematoma raising concern for abdominal/chest wall trauma. -hold DVT chemoppx for now, type/screen, trend CBC -Continue to monitor  Hypomag -Replete  Best practice:  Diet: NPO  Pain/Anxiety/Delirium protocol (if indicated): yes VAP protocol (if indicated): yes DVT prophylaxis: SCDs GI prophylaxis: not indicated Glucose control: SSI Mobility: bed level Code Status: Full  Family Communication: Will update family Disposition: ICU  Labs   CBC: Recent Labs  Lab 08/05/19 1152 08/05/19 1200 08/05/19 2314 08/06/19 0037  WBC  --  10.9*  --  7.5  NEUTROABS  --  8.2*  --   --   HGB 15.6* 14.9 11.6* 8.1*  HCT 46.0 43.4 34.0* 22.8*  MCV  --  95.6  --  94.6  PLT  --  338  --  299    Basic Metabolic Panel: Recent Labs  Lab 08/05/19 1152 08/05/19 1200 08/05/19 2314 08/06/19 0037 08/06/19 0352  NA 125* 124* 124* 129* 127*  K 4.1 4.0 3.4* 4.1 3.9  CL 94* 90*  --  97* 96*  CO2  --  16*  --  19* 14*  GLUCOSE 84 87  --  94 78  BUN 6 8  --  9 11  CREATININE 1.20* 1.05*  --  1.06* 1.17*  CALCIUM  --  9.2  --  8.1* 8.8*  MG  --  1.8  --  1.5*  --   PHOS  --   --   --  2.9  --    GFR: Estimated Creatinine Clearance: 44.2 mL/min (A) (by C-G formula based on SCr of 1.17 mg/dL (H)). Recent Labs  Lab 08/05/19 1200 08/05/19 1402 08/06/19 0037  WBC 10.9*  --  7.5  LATICACIDVEN  --  2.7*  --     Liver Function Tests: Recent Labs  Lab 08/05/19 1200 08/06/19 0352  AST 57* 79*  ALT 25 33  ALKPHOS 93 73  BILITOT 1.1 1.6*  PROT 8.1 6.6  ALBUMIN 3.7 2.9*   Recent Labs  Lab 08/05/19 1200  LIPASE 69*   Recent Labs  Lab 08/06/19 0035  AMMONIA 30    ABG    Component Value Date/Time   PHART 7.533 (H) 08/05/2019 2314   PCO2ART 24.7 (L) 08/05/2019 2314   PO2ART 196.0 (H) 08/05/2019 2314   HCO3 20.8 08/05/2019 2314    TCO2 22 08/05/2019 2314   ACIDBASEDEF 1.0 08/05/2019 2314   O2SAT 100.0 08/05/2019 2314     Coagulation Profile: Recent Labs  Lab 08/05/19 1200  INR 1.0    Cardiac Enzymes: No results for input(s): CKTOTAL, CKMB, CKMBINDEX, TROPONINI in the last 168 hours.  HbA1C: Hgb A1c MFr Bld  Date/Time Value Ref Range Status  03/26/2015 09:21 AM 5.3 4.8 - 5.6 % Final    Comment:             Pre-diabetes: 5.7 - 6.4  Diabetes: >6.4          Glycemic control for adults with diabetes: <7.0     CBG: Recent Labs  Lab 08/05/19 1153 08/06/19 0027 08/06/19 0402 08/06/19 0506 08/06/19 0727  GLUCAP 76 69* 53* 100* 97    Critical care time: 32 min    The patient is critically ill with multiple organ systems failure and requires high complexity decision making for assessment and support, frequent evaluation and titration of therapies, application of advanced monitoring technologies and extensive interpretation of multiple databases.    Mechele Collin, M.D. [facility] Pulmonary/Critical Care Medicine 08/06/2019 9:42 AM   Please see Amion for pager number to reach on-call Pulmonary and Critical Care Team.

## 2019-08-06 NOTE — Plan of Care (Signed)
Discussed in front of patient plan of care for the evening, pain management and potassium replacement with no evidence of learning at the time.

## 2019-08-06 NOTE — Progress Notes (Signed)
Hypoglycemic Event  CBG: 53 @ 406  Treatment: 1/2 AMP D50 @ 415 (Scanned at 421)   Symptoms: Asymptomatic  Follow-up CBG: Time: 0506 CBG Result: 100  Possible Reasons for Event: NPO  Comments/MD notified: E-link Felcia RN    Peter Minium

## 2019-08-06 NOTE — Progress Notes (Addendum)
Area of Abrasion to her abdomen appears to be larger in bruising area and warm to the touch.  Hgb dropped from 14.9 to 11.6 to 8.1 this morning at 0037.  Asked E-link for a STAT CBC or H&H.

## 2019-08-06 NOTE — Progress Notes (Signed)
CRITICAL VALUE ALERT  Critical Value:  277 serum osmolality  Date & Time Notied:  08/06/19  0612  Provider Notified: Dr. Warrick Parisian via Nurse Sunny Schlein  Orders Received/Actions taken: No orders at this time

## 2019-08-06 NOTE — Progress Notes (Signed)
eLink Physician-Brief Progress Note Patient Name: Cindy [name] DOB: 08-12-1960 MRN: [id number]   Date of Service  08/06/2019  HPI/Events of Note  Pt with coffee grounds emesis, also hypoglycemia. Pt is tachycardic to the 120's.  eICU Interventions  Protonix 40 mg iv Q 12 hours, stat CBC, D 5 % LR at 75 ml/ hour, Albumin 5 % 12.5 gm iv bolus x 1, Type and screen.        Thomasene Lot Nayeli Calvert 08/06/2019, 5:48 AM

## 2019-08-06 NOTE — Progress Notes (Signed)
PCCM Interval Note  Updated patient's husband via telephone. Patient remains intubated until seizure and possible GIB can be ruled out. Addressed his questions.  Mechele Collin, M.D. [facility] Pulmonary/Critical Care Medicine 08/06/2019 1:03 PM

## 2019-08-06 NOTE — Progress Notes (Addendum)
RN request tech to come back after pt has weaned from meds. Will try back when schedule permits.

## 2019-08-06 NOTE — Progress Notes (Signed)
  Echocardiogram 2D Echocardiogram has been performed.  Cindy [name] F 08/06/2019, 4:11 PM

## 2019-08-06 NOTE — Progress Notes (Signed)
Initial Nutrition Assessment  **RD working remotely**  DOCUMENTATION CODES:   Not applicable  INTERVENTION:  If pt is to remain NPO, recommend initiating enteral nutrition. Consider:  Vital 1.5 cal @ 51ml/hr via OG tube, increase 76ml/hr Q4h until goal of 30ml/hr ( ) is reached.  Recommended tube feeding will provide  1620 kcals, 73 grams of protein, free water  NUTRITION DIAGNOSIS:   Inadequate oral intake related to inability to eat as evidenced by NPO status.    GOAL:   Patient will meet greater than or equal to 90% of their needs    MONITOR:   TF tolerance, Weight trends, Labs, Vent status  REASON FOR ASSESSMENT:   Malnutrition Screening Tool    ASSESSMENT:   Pt with a PMH significant for CVA, HTN, HLD, and polysubstance abuse presented to the ED with acute metabolic encephalopathy and actively seizing  3/19 OG tube, tip in stomach  Per MD, pt remains intubated until seizure and possible GIB can be ruled out.  Coffee ground emesis noted. Discussed pt with RN, plan is to hold off on tube feedings due to emesis and reassess later.   UOP: x24 hours I/O: +2,769.7ml since admit 55ml output from OG x24 hours  Patient is currently intubated on ventilator support MV: 8.5L/min Temp (24hrs), Avg:99.6 F (37.6 C), Min:96.8 F (36 C), Max:101.5 F (38.6 C)  Propofol: 8.9 ml/hr, providing 234 kcals  Per MD, pt to be transitioned from propofol to fentanyl and precedex  Medications reviewed and include: Folic acid, SSI, MVI, Thiamine Drips: Fentanyl, Propofol  Labs reviewed: Na 127 (L) CBGs 53-103    NUTRITION - FOCUSED PHYSICAL EXAM:  RD unable to perform at this time.    Diet Order:   Diet Order            Diet NPO time specified  Diet effective now              EDUCATION NEEDS:   Not appropriate for education at this time  Skin:  Skin Assessment: Reviewed RN Assessment  Last BM:  unknown  Height:   Ht Readings from  Last 1 Encounters:  08/05/19 5\' 4"  (1.626 m)    Weight:   Wt Readings from Last 2 Encounters:  08/06/19 59.2 kg  03/26/15 62.1 kg    BMI:  Body mass index is 22.4 kg/m.  Estimated Nutritional Needs:   Kcal:  1603  Protein:  70-85 grams  Fluid:  >/=1.5L   13/07/16, MS, RD, LDN RD pager number and weekend/on-call pager number located in Amion.

## 2019-08-07 ENCOUNTER — Inpatient Hospital Stay (HOSPITAL_COMMUNITY): Payer: Self-pay

## 2019-08-07 LAB — BASIC METABOLIC PANEL
Anion gap: 6 (ref 5–15)
Anion gap: 9 (ref 5–15)
BUN: 8 mg/dL (ref 6–20)
BUN: 9 mg/dL (ref 6–20)
CO2: 20 mmol/L — ABNORMAL LOW (ref 22–32)
CO2: 19 mmol/L — ABNORMAL LOW (ref 22–32)
Calcium: 8.3 mg/dL — ABNORMAL LOW (ref 8.9–10.3)
Calcium: 8.5 mg/dL — ABNORMAL LOW (ref 8.9–10.3)
Chloride: 103 mmol/L (ref 98–111)
Chloride: 102 mmol/L (ref 98–111)
Creatinine, Ser: 1.01 mg/dL — ABNORMAL HIGH (ref 0.44–1.00)
Creatinine, Ser: 0.91 mg/dL (ref 0.44–1.00)
GFR calc Af Amer: >60 mL/min (ref >60)
GFR calc Af Amer: >60 mL/min (ref >60)
GFR calc non Af Amer: >60 mL/min (ref >60)
GFR calc non Af Amer: >60 mL/min (ref >60)
Glucose, Bld: 182 mg/dL — ABNORMAL HIGH (ref 70–99)
Glucose, Bld: 122 mg/dL — ABNORMAL HIGH (ref 70–99)
Potassium: 3.6 mmol/L (ref 3.5–5.1)
Potassium: 3.6 mmol/L (ref 3.5–5.1)
Sodium: 129 mmol/L — ABNORMAL LOW (ref 135–145)
Sodium: 130 mmol/L — ABNORMAL LOW (ref 135–145)

## 2019-08-07 LAB — CBC
HCT: 31.8 % — ABNORMAL LOW (ref 36.0–46.0)
HCT: 32.8 % — ABNORMAL LOW (ref 36.0–46.0)
Hemoglobin: 10.9 g/dL — ABNORMAL LOW (ref 12.0–15.0)
Hemoglobin: 11.1 g/dL — ABNORMAL LOW (ref 12.0–15.0)
MCH: 33 pg (ref 26.0–34.0)
MCH: 32.6 pg (ref 26.0–34.0)
MCHC: 34.3 g/dL (ref 30.0–36.0)
MCHC: 33.8 g/dL (ref 30.0–36.0)
MCV: 96.4 fL (ref 80.0–100.0)
MCV: 96.5 fL (ref 80.0–100.0)
Platelets: 158 10*3/uL (ref 150–400)
Platelets: 188 10*3/uL (ref 150–400)
RBC: 3.3 MIL/uL — ABNORMAL LOW (ref 3.87–5.11)
RBC: 3.4 MIL/uL — ABNORMAL LOW (ref 3.87–5.11)
RDW: 12.2 % (ref 11.5–15.5)
RDW: 12.3 % (ref 11.5–15.5)
WBC: 10.4 10*3/uL (ref 4.0–10.5)
WBC: 9.2 10*3/uL (ref 4.0–10.5)
nRBC: 0 % (ref 0.0–0.2)
nRBC: 0 % (ref 0.0–0.2)

## 2019-08-07 LAB — GLUCOSE, CAPILLARY
Glucose-Capillary: 117 mg/dL — ABNORMAL HIGH (ref 70–99)
Glucose-Capillary: 122 mg/dL — ABNORMAL HIGH (ref 70–99)
Glucose-Capillary: 125 mg/dL — ABNORMAL HIGH (ref 70–99)
Glucose-Capillary: 84 mg/dL (ref 70–99)
Glucose-Capillary: 88 mg/dL (ref 70–99)
Glucose-Capillary: 89 mg/dL (ref 70–99)

## 2019-08-07 LAB — MAGNESIUM
Magnesium: 1.6 mg/dL — ABNORMAL LOW (ref 1.7–2.4)
Magnesium: 2.1 mg/dL (ref 1.7–2.4)

## 2019-08-07 LAB — PHOSPHORUS
Phosphorus: 2.5 mg/dL (ref 2.5–4.6)
Phosphorus: 2.9 mg/dL (ref 2.5–4.6)

## 2019-08-07 LAB — TRIGLYCERIDES: Triglycerides: 49 mg/dL (ref <150)

## 2019-08-07 IMAGING — DX DG CHEST 1V PORT
1 series · 1 of 1 positions shown · non-contrast
Comparison: [DATE].

CLINICAL DATA: Central line placement.

EXAM:
PORTABLE CHEST 1 VIEW

[chest]
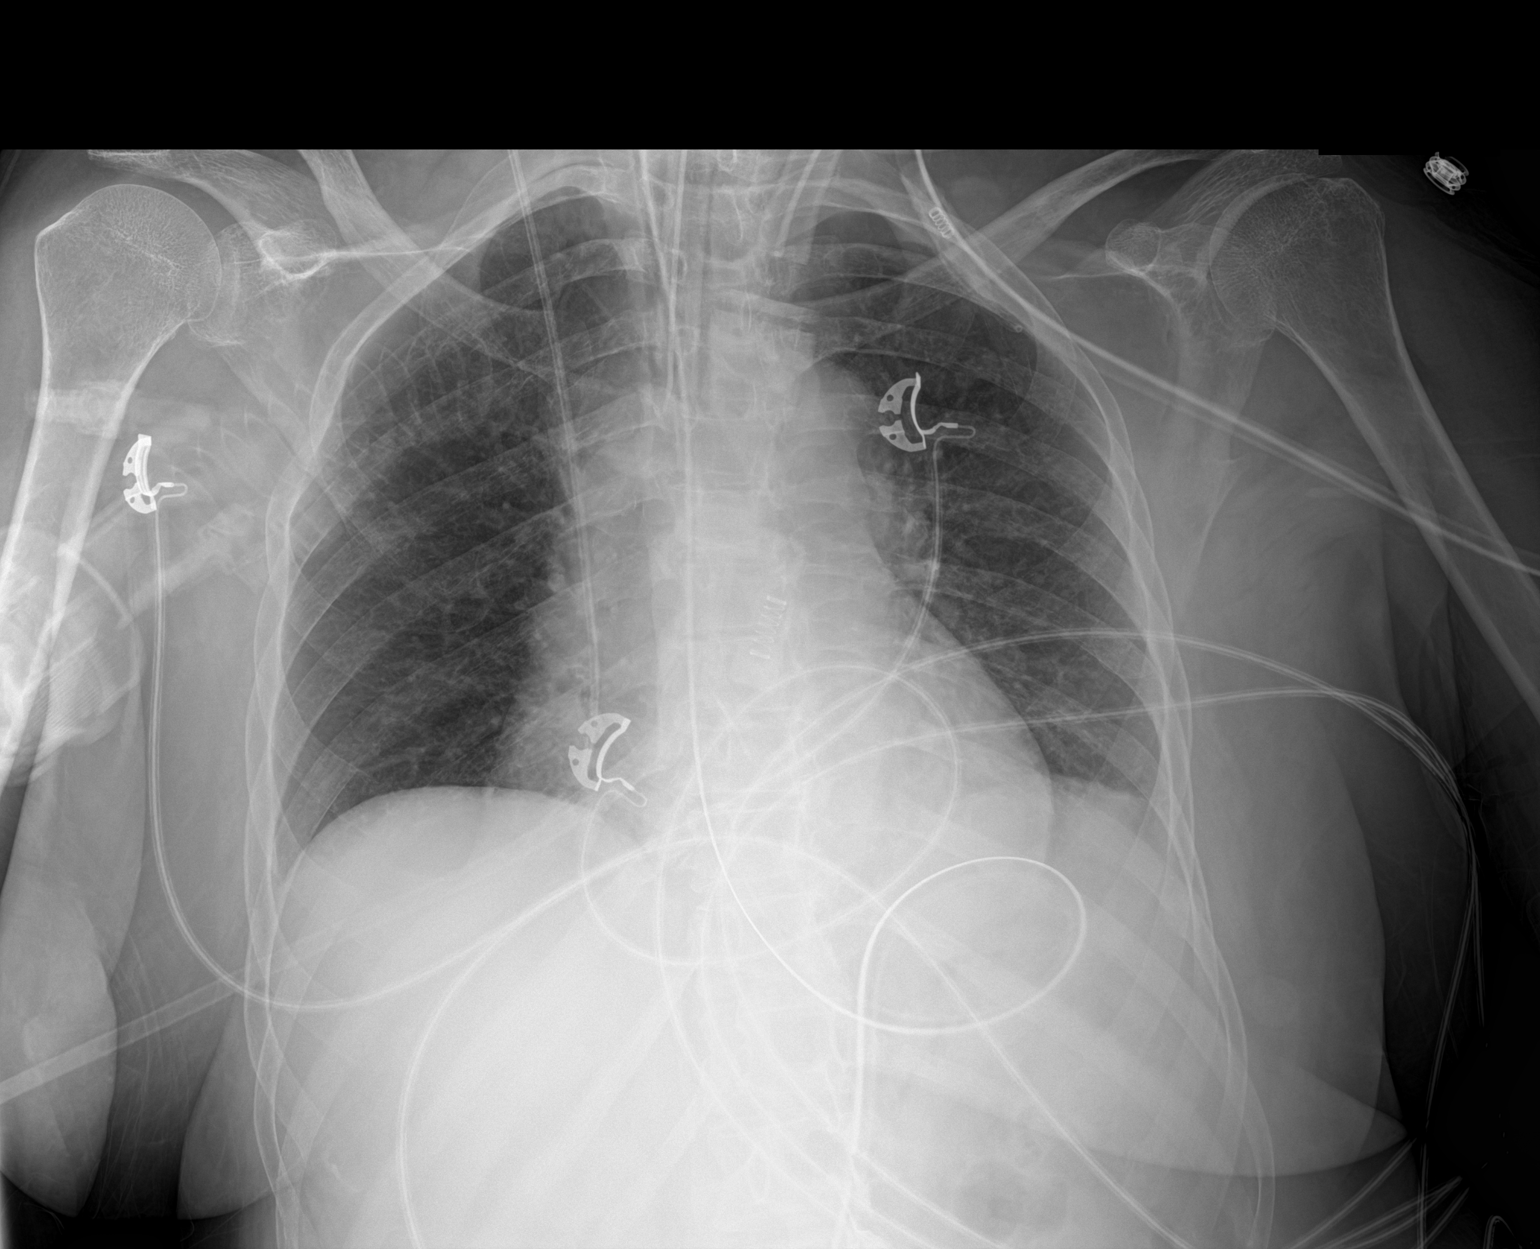

[1 of 1 positions shown; findings below may reference images not displayed]

FINDINGS: Endotracheal and nasogastric tubes are in good position. Interval
placement of right internal jugular catheter with distal tip in
right atrium. No pneumothorax or pleural effusion is noted. Lungs
are clear. Bony thorax is unremarkable.
IMPRESSION: Interval placement of right internal jugular catheter with distal
tip in right atrium; withdrawal by 2-3 cm is recommended. No
pneumothorax is noted.

## 2019-08-07 MED ORDER — PRO-STAT SUGAR FREE PO LIQD
30.0000 mL | Freq: Two times a day (BID) | ORAL | Status: DC
  Administered 2019-08-07 – 2019-08-08 (×3): 30 mL
  Filled 2019-08-07 (×3): qty 30

## 2019-08-07 MED ORDER — MAGNESIUM SULFATE 2 GM/50ML IV SOLN
2.0000 g | Freq: Once | INTRAVENOUS | Status: AC
  Administered 2019-08-07: 2 g via INTRAVENOUS
  Filled 2019-08-07: qty 50

## 2019-08-07 MED ORDER — ENOXAPARIN SODIUM 40 MG/0.4ML ~~LOC~~ SOLN
40.0000 mg | SUBCUTANEOUS | Status: DC
  Administered 2019-08-07 – 2019-08-15 (×9): 40 mg via SUBCUTANEOUS
  Filled 2019-08-07 (×9): qty 0.4

## 2019-08-07 MED ORDER — SODIUM CHLORIDE 0.9 % IV SOLN: INTRAVENOUS | Status: DC

## 2019-08-07 MED ORDER — VITAL HIGH PROTEIN PO LIQD
1000.0000 mL | ORAL | Status: DC
  Administered 2019-08-07: 1000 mL

## 2019-08-07 MED ORDER — LIP MEDEX EX OINT
TOPICAL_OINTMENT | CUTANEOUS | Status: DC | PRN
  Filled 2019-08-07: qty 7

## 2019-08-07 NOTE — Progress Notes (Signed)
Patient receiving two different IVF D5/LR for NPO and hypovolemia plus NS d/t low Na 128 last reading.  Requested for both to be lowered at the rate or one discontinued but still paying attention to her low NA levels.  Will continue to monitor with labs and her blood pressure since it was lower earlier in the evening 85-90's/60's (70's). E- link contacted

## 2019-08-07 NOTE — Procedures (Signed)
ELECTROENCEPHALOGRAM REPORT   Patient: Cindy [name]       Room #: 2I02W EEG No. ID: [id number] Age: 59 y.o.        Sex: female Requesting Physician: Everardo All Report Date:  08/07/2019        Interpreting Physician: Thana Farr  History: Cindy [name] is an 59 y.o. female with seizures  Medications:  Precedex, Fentanyl, Thiamine, Insulin, Zithromax  Conditions of Recording:  This is a 21 channel routine scalp EEG performed with bipolar and monopolar montages arranged in accordance to the international 10/20 system of electrode placement. One channel was dedicated to EKG recording.  The patient is in the intubated and sedated state.  Description:  The background activity is slow and poorly organized.  It consists of low voltage activity along the delta-theta continuum.  This activity is continuous and diffusely distributed.  There is superimposed fast beta activity noted throughout as well  No epileptiform activity is appreciated. Hyperventilation and intermittent photic stimulation were not performed.  IMPRESSION: This is an abnormal EEG secondary to general background slowing.  This finding may be seen with a diffuse disturbance that is etiologically nonspecific, but may include a metabolic encephalopathy or post-ictal state, among other possibilities.  The fast beta activity noted likely represents a medication effect.  No epileptiform activity is noted.     Thana Farr, MD Neurology [phone] 08/07/2019, 8:19 PM

## 2019-08-07 NOTE — Progress Notes (Signed)
eLink Physician-Brief Progress Note Patient Name: Cindy [name] DOB: 04/15/1961 MRN: [id number]   Date of Service  08/07/2019  HPI/Events of Note  Pt had 2 different maintenance fluid IV's  eICU Interventions  NS discontinued.        Cindy [name] Cindy [name] 08/07/2019, 12:57 AM

## 2019-08-07 NOTE — Procedures (Signed)
Central Venous Catheter Insertion Procedure Note Cindy [name] [id number] June 13, 1960  Procedure: Insertion of Central Venous Catheter Indications: Assessment of intravascular volume, Drug and/or fluid administration and Frequent blood sampling  Procedure Details Consent: Risks of procedure as well as the alternatives and risks of each were explained to the (patient/caregiver).  Consent for procedure obtained. Time Out: Verified patient identification, verified procedure, site/side was marked, verified correct patient position, special equipment/implants available, medications/allergies/relevent history reviewed, required imaging and test results available.  Performed  Maximum sterile technique was used including antiseptics, cap, gloves, gown, hand hygiene, mask and sheet. Skin prep: Chlorhexidine; local anesthetic administered A antimicrobial bonded/coated triple lumen catheter was placed in the right internal jugular vein using the Seldinger technique.  Evaluation Blood flow good Complications: No apparent complications Patient did tolerate procedure well. Chest X-ray ordered to verify placement.  CXR: pending.  Cindy [name] Cindy [name] 08/07/2019, 9:34 AM

## 2019-08-07 NOTE — Progress Notes (Signed)
NAME:  Cindy [name], MRN:  [id number], DOB:  02-Feb-1961, LOS: 2 ADMISSION DATE:  08/05/2019, CONSULTATION DATE:  08/05/19 REFERRING MD:  Dr. Deretha Emory, CHIEF COMPLAINT:  AMS    Brief History   59 y/o F who presented to [facility] 3/19 with reports of altered mental status. Active seizures on arrival to ER.  Hx of polysubstance abuse.   History of present illness   60 y/o F who presented to [facility] on 3/19 via EMS with reports of AMS.    The patient was actively seizing on arrival to ER.  She was noted to have eye deviation to the right with twitching of the face / unresponsive. There were concerns of vomiting prior to arrival.  She was treated with IV ativan on arrival and keppra.  The patient was intubated for airway protection. UDS was noted to be cocaine positive.  Salicylate level <7, ETOH 21.  CXR with ETT in appropriate position, no infiltrate. Initial labs - Na 124, K 4, Cl 90, CO2 16, glucose 87, BUN 8, Sr Cr 1.08, AG 18, lipase 69, AST 57, ALT 25, BNP 121, troponin 33, WBC 10.9, Hgb 14.9 and platelets 338.    The patient was transferred to [facility] for further evaluation.   Past Medical History  CVA - 2005 HTN HLD  Abnormal LFT's  Polysubstance Abuse   Significant Hospital Events   3/19 Admit with AMS, active seizures on arrival, intubated   Consults:  PCCM   Procedures:  ETT 2/19 >>   Significant Diagnostic Tests:   CT Head 3/19 >> no acute intracranial pathology, extensive periventricular and deep white matter hypodensity markedly increased compared to remote prior CT 2006.    Micro Data:  UA 3/19 >> glucose >500, 5 ketones, 100 protein, rare bacteria, 0-5 WBC Resp culture 3/19>Few GPC  Antimicrobials:  None  Interim history/subjective:  Lost IV access this morning. Central line placed this am. On minimal vent settings.  Objective   Blood pressure 114/75, pulse 71, temperature 97.9 F (36.6 C), temperature source Axillary, resp. rate 15, height 5\' 4"  (1.626 m), weight 62.9  kg, SpO2 100 %.    Vent Mode: PRVC FiO2 (%):  [30 %] 30 % Set Rate:  [14 bmp] 14 bmp Vt Set:  [430 mL] 430 mL PEEP:  [5 cmH20] 5 cmH20 Plateau Pressure:  [11 cmH20-16 cmH20] 11 cmH20   Intake/Output Summary (Last 24 hours) at 08/07/2019 1041 Last data filed at 08/07/2019 0659 Gross per 24 hour  Intake 4302.57 ml  Output 1125 ml  Net 3177.57 ml   Filed Weights   08/05/19 2215 08/06/19 0331 08/07/19 0500  Weight: 59.2 kg 59.2 kg 62.9 kg   Physical Exam: General: Chronically ill-appearing, no acute distress HENT: [place], AT, ETT in place Eyes: EOMI, no scleral icterus Respiratory: Clear to auscultation bilaterally.  No crackles, wheezing or rales Cardiovascular: RRR, -M/R/G, no JVD GI: BS+, soft, nontender Skin: Improved flank bruising. R flank indurated hematoma improved. Extremities:-Edema,-tenderness Neuro: Opens eyes spontaneously, does not follow commands, moves extremities x 4  Resolved Hospital Problem list      Assessment & Plan:   Seizure  Acute Metabolic Encephalopathy  Alcohol withdrawal a possibility. Multiple metabolic derangements have either lowered threshold or precipitated SZ. UDS +cocaine. Remaining tox panel negative. Ammonia wnl. Does have chart history of CVA and this may represent a focus for SZ activity. CT head with no acute intracranial abnormalities -seizure precautions   -PAD protocol: On fentanyl and Precedex for RASS goal -  1 -EEG  Acute Respiratory Failure Intubation for airway protection but vomiting prior to arrival. -Full vent support -Daily SBT/WUA -On Ceftriaxone and azithro -VAP   Coffee ground emesis - no further episodes -PPI BID -Daily CBC -Continue Ceftriaxone empirically  Hyponatremia, appears hypovolemic In setting of ETOH abuse  -Trend BMP  AKI with metabolic acidosis - improving acidosis. This may represent her baseline line creatinine -Monitor UOP/Cr  Polysubstance Abuse  Hx of ETOH + cocaine positive on admit  -CIWA  protocol as above  -substance abuse counseling once able to participate   -thiamine, folic acid and multivitamin  R flank mass/hematoma - improving likely hematoma raising concern for abdominal/chest wall trauma. -hold DVT chemoppx for now, type/screen, trend CBC -Continue to monitor  Hypomag -Replete  Best practice:  Diet: Start TF Pain/Anxiety/Delirium protocol (if indicated): yes VAP protocol (if indicated): yes DVT prophylaxis: SCDs GI prophylaxis: PPI BID Glucose control: SSI Mobility: bed level Code Status: Full  Family Communication: Will update family Disposition: ICU  Labs   CBC: Recent Labs  Lab 08/05/19 1200 08/05/19 2314 08/06/19 0918 08/06/19 1553 08/06/19 2130 08/07/19 0330 08/07/19 0807  WBC 10.9*   < > 11.7* 6.9 10.1 10.4 9.2  NEUTROABS 8.2*  --  10.0*  --   --   --   --   HGB 14.9   < > 12.4 10.7* 12.2 10.9* 11.1*  HCT 43.4   < > 35.4* 31.1* 36.5 31.8* 32.8*  MCV 95.6   < > 94.4 94.0 99.2 96.4 96.5  PLT 338   < > 229 172 169 158 188   < > = values in this interval not displayed.    Basic Metabolic Panel: Recent Labs  Lab 08/05/19 1200 08/05/19 1200 08/05/19 2314 08/06/19 0037 08/06/19 0352 08/06/19 1553 08/07/19 0807  NA 124*   < > 124* 129* 127* 128* 129*  K 4.0   < > 3.4* 4.1 3.9 3.0* 3.6  CL 90*  --   --  97* 96* 98 103  CO2 16*  --   --  19* 14* 19* 20*  GLUCOSE 87  --   --  94 78 211* 182*  BUN 8  --   --  9 11 9 8   CREATININE 1.05*  --   --  1.06* 1.17* 1.04* 1.01*  CALCIUM 9.2  --   --  8.1* 8.8* 8.4* 8.3*  MG 1.8  --   --  1.5*  --   --   --   PHOS  --   --   --  2.9  --   --   --    < > = values in this interval not displayed.   GFR: Estimated Creatinine Clearance: 51.1 mL/min (A) (by C-G formula based on SCr of 1.01 mg/dL (H)). Recent Labs  Lab 08/05/19 1402 08/06/19 0037 08/06/19 0918 08/06/19 1020 08/06/19 1310 08/06/19 1553 08/06/19 2130 08/07/19 0330 08/07/19 0807  WBC  --    < >   < >  --   --  6.9 10.1  10.4 9.2  LATICACIDVEN 2.7*  --   --  1.3 1.5  --   --   --   --    < > = values in this interval not displayed.    Liver Function Tests: Recent Labs  Lab 08/05/19 1200 08/06/19 0352  AST 57* 79*  ALT 25 33  ALKPHOS 93 73  BILITOT 1.1 1.6*  PROT 8.1 6.6  ALBUMIN 3.7 2.9*  Recent Labs  Lab 08/05/19 1200  LIPASE 69*   Recent Labs  Lab 08/06/19 0035  AMMONIA 30    ABG    Component Value Date/Time   PHART 7.533 (H) 08/05/2019 2314   PCO2ART 24.7 (L) 08/05/2019 2314   PO2ART 196.0 (H) 08/05/2019 2314   HCO3 20.8 08/05/2019 2314   TCO2 22 08/05/2019 2314   ACIDBASEDEF 1.0 08/05/2019 2314   O2SAT 100.0 08/05/2019 2314     Coagulation Profile: Recent Labs  Lab 08/05/19 1200  INR 1.0    Cardiac Enzymes: No results for input(s): CKTOTAL, CKMB, CKMBINDEX, TROPONINI in the last 168 hours.  HbA1C: Hgb A1c MFr Bld  Date/Time Value Ref Range Status  03/26/2015 09:21 AM 5.3 4.8 - 5.6 % Final    Comment:             Pre-diabetes: 5.7 - 6.4          Diabetes: >6.4          Glycemic control for adults with diabetes: <7.0     CBG: Recent Labs  Lab 08/06/19 1522 08/06/19 1944 08/06/19 2338 08/07/19 0349 08/07/19 0729  GLUCAP 211* 117* 87 122* 88    Critical care time: 36 min    The patient is critically ill with multiple organ systems failure and requires high complexity decision making for assessment and support, frequent evaluation and titration of therapies, application of advanced monitoring technologies and extensive interpretation of multiple databases.   Rodman Pickle, M.D. [facility] Pulmonary/Critical Care Medicine 08/07/2019 10:41 AM   Please see Amion for pager number to reach on-call Pulmonary and Critical Care Team.

## 2019-08-07 NOTE — Progress Notes (Signed)
EEG complete - results pending 

## 2019-08-08 DIAGNOSIS — E871 Hypo-osmolality and hyponatremia: Secondary | ICD-10-CM

## 2019-08-08 DIAGNOSIS — F1910 Other psychoactive substance abuse, uncomplicated: Secondary | ICD-10-CM

## 2019-08-08 LAB — CBC
HCT: 32.8 % — ABNORMAL LOW (ref 36.0–46.0)
Hemoglobin: 10.9 g/dL — ABNORMAL LOW (ref 12.0–15.0)
MCH: 32.5 pg (ref 26.0–34.0)
MCHC: 33.2 g/dL (ref 30.0–36.0)
MCV: 97.9 fL (ref 80.0–100.0)
Platelets: 184 10*3/uL (ref 150–400)
RBC: 3.35 MIL/uL — ABNORMAL LOW (ref 3.87–5.11)
RDW: 12.4 % (ref 11.5–15.5)
WBC: 9.5 10*3/uL (ref 4.0–10.5)
nRBC: 0 % (ref 0.0–0.2)

## 2019-08-08 LAB — BASIC METABOLIC PANEL
Anion gap: 6 (ref 5–15)
BUN: 14 mg/dL (ref 6–20)
CO2: 20 mmol/L — ABNORMAL LOW (ref 22–32)
Calcium: 8.4 mg/dL — ABNORMAL LOW (ref 8.9–10.3)
Chloride: 104 mmol/L (ref 98–111)
Creatinine, Ser: 1 mg/dL (ref 0.44–1.00)
GFR calc Af Amer: >60 mL/min (ref >60)
GFR calc non Af Amer: >60 mL/min (ref >60)
Glucose, Bld: 151 mg/dL — ABNORMAL HIGH (ref 70–99)
Potassium: 3.5 mmol/L (ref 3.5–5.1)
Sodium: 130 mmol/L — ABNORMAL LOW (ref 135–145)

## 2019-08-08 LAB — GLUCOSE, CAPILLARY
Glucose-Capillary: 64 mg/dL — ABNORMAL LOW (ref 70–99)
Glucose-Capillary: 169 mg/dL — ABNORMAL HIGH (ref 70–99)
Glucose-Capillary: 106 mg/dL — ABNORMAL HIGH (ref 70–99)
Glucose-Capillary: 105 mg/dL — ABNORMAL HIGH (ref 70–99)
Glucose-Capillary: 128 mg/dL — ABNORMAL HIGH (ref 70–99)
Glucose-Capillary: 119 mg/dL — ABNORMAL HIGH (ref 70–99)
Glucose-Capillary: 113 mg/dL — ABNORMAL HIGH (ref 70–99)
Glucose-Capillary: 119 mg/dL — ABNORMAL HIGH (ref 70–99)
Glucose-Capillary: 126 mg/dL — ABNORMAL HIGH (ref 70–99)

## 2019-08-08 LAB — PHOSPHORUS: Phosphorus: 3.7 mg/dL (ref 2.5–4.6)

## 2019-08-08 LAB — CULTURE, RESPIRATORY W GRAM STAIN: Culture: NORMAL

## 2019-08-08 LAB — MAGNESIUM: Magnesium: 2 mg/dL (ref 1.7–2.4)

## 2019-08-08 MED ORDER — THIAMINE HCL 100 MG/ML IJ SOLN: 100.0000 mg | Freq: Every day | INTRAMUSCULAR | Status: DC

## 2019-08-08 MED ORDER — LORAZEPAM 1 MG PO TABS
1.0000 mg | ORAL_TABLET | ORAL | Status: DC | PRN
  Administered 2019-08-09: 2 mg via ORAL
  Administered 2019-08-10: 4 mg via ORAL
  Filled 2019-08-08: qty 2
  Filled 2019-08-08: qty 4

## 2019-08-08 MED ORDER — OSMOLITE 1.2 CAL PO LIQD
1000.0000 mL | ORAL | Status: DC
  Administered 2019-08-08 – 2019-08-15 (×7): 1000 mL
  Filled 2019-08-08 (×12): qty 1000

## 2019-08-08 MED ORDER — ORAL CARE MOUTH RINSE
15.0000 mL | Freq: Two times a day (BID) | OROMUCOSAL | Status: DC
  Administered 2019-08-09 – 2019-08-12 (×7): 15 mL via OROMUCOSAL

## 2019-08-08 MED ORDER — CHLORHEXIDINE GLUCONATE 0.12 % MT SOLN
15.0000 mL | Freq: Two times a day (BID) | OROMUCOSAL | Status: DC
  Administered 2019-08-09 – 2019-08-12 (×6): 15 mL via OROMUCOSAL
  Filled 2019-08-08 (×6): qty 15

## 2019-08-08 MED ORDER — THIAMINE HCL 100 MG PO TABS: 100.0000 mg | ORAL_TABLET | Freq: Every day | ORAL | Status: DC

## 2019-08-08 MED ORDER — LORAZEPAM 2 MG/ML IJ SOLN
1.0000 mg | INTRAMUSCULAR | Status: DC | PRN
  Administered 2019-08-09 – 2019-08-10 (×4): 4 mg via INTRAVENOUS
  Filled 2019-08-08 (×4): qty 2

## 2019-08-08 MED ORDER — PANTOPRAZOLE SODIUM 40 MG IV SOLR: 40.0000 mg | Freq: Every day | INTRAVENOUS | Status: DC

## 2019-08-08 NOTE — Progress Notes (Signed)
NAME:  Cindy [name], MRN:  [id number], DOB:  11-16-1960, LOS: 3 ADMISSION DATE:  08/05/2019, CONSULTATION DATE:  08/05/19 REFERRING MD:  Dr. Deretha Emory, CHIEF COMPLAINT:  AMS    Brief History   59 y/o F who presented to [facility] 3/19 with reports of altered mental status. Active seizures on arrival to ER.  Hx of polysubstance abuse.   History of present illness   59 y/o F who presented to [facility] on 3/19 via EMS with reports of AMS.    The patient was actively seizing on arrival to ER.  She was noted to have eye deviation to the right with twitching of the face / unresponsive. There were concerns of vomiting prior to arrival.  She was treated with IV ativan on arrival and keppra.  The patient was intubated for airway protection. UDS was noted to be cocaine positive.  Salicylate level <7, ETOH 21.  CXR with ETT in appropriate position, no infiltrate. Initial labs - Na 124, K 4, Cl 90, CO2 16, glucose 87, BUN 8, Sr Cr 1.08, AG 18, lipase 69, AST 57, ALT 25, BNP 121, troponin 33, WBC 10.9, Hgb 14.9 and platelets 338.    The patient was transferred to [facility] for further evaluation.   Past Medical History  CVA - 2005 HTN HLD  Abnormal LFT's  Polysubstance Abuse   Significant Hospital Events   3/19 Admit with AMS, active seizures on arrival, intubated  3/22 Self-extubated  Consults:  PCCM   Procedures:  ETT 3/19 >> 3/22  Significant Diagnostic Tests:   CT Head 3/19 >> no acute intracranial pathology, extensive periventricular and deep white matter hypodensity markedly increased compared to remote prior CT 2006.    EEG 3/22 Negative for seizures  Micro Data:  UA 3/19 >> glucose >500, 5 ketones, 100 protein, rare bacteria, 0-5 WBC Resp culture 3/19>Few GPC  Antimicrobials:  Ceftriaxone 3/20-3/21 Azithro 3/20-3/21  Interim history/subjective:  Self-extubated this morning. Protecting airway. Remains encephalopathic.  Objective   Blood pressure (!) 167/93, pulse 96, temperature 97.8 F  (36.6 C), temperature source Oral, resp. rate 15, height 5\' 4"  (1.626 m), weight 61.3 kg, SpO2 99 %.    Vent Mode: PSV;CPAP FiO2 (%):  [30 %] 30 % Set Rate:  [14 bmp-16 bmp] 14 bmp Vt Set:  [430 mL] 430 mL PEEP:  [5 cmH20] 5 cmH20 Pressure Support:  [5 cmH20] 5 cmH20 Plateau Pressure:  [12 cmH20-22 cmH20] 17 cmH20   Intake/Output Summary (Last 24 hours) at 08/08/2019 08/10/2019 Last data filed at 08/08/2019 0800 Gross per 24 hour  Intake 1412.43 ml  Output 490 ml  Net 922.43 ml   Filed Weights   08/06/19 0331 08/07/19 0500 08/08/19 0348  Weight: 59.2 kg 62.9 kg 61.3 kg   Physical Exam: General: Chronically ill-appearing, no acute distress, encephalopathic HENT: [place], AT, OP clear, MMM Eyes: EOMI, no scleral icterus Respiratory: Coarse breath sounds bilaterally Cardiovascular: RRR, -M/R/G, no JVD GI: BS+, soft, nontender Extremities:-Edema,-tenderness Neuro: Opens eyes spontaneously, intermittently/inconsistently follows commands, moves extremities x 4 Skin: Improved flank bruising. R flank indurated hematoma stable   Resolved Hospital Problem list    AKI  Assessment & Plan:   Acute Metabolic Encephalopathy  - improving Polysubstance abuse Alcohol withdrawal a possibility. Multiple metabolic derangements have either lowered threshold or precipitated SZ. UDS +cocaine and alcohol. Remaining tox panel negative. Ammonia wnl. Does have chart history of CVA and this may represent a focus for SZ activity. CT head with no acute intracranial abnormalities. EEG  negative -seizure precautions  -minimize sedation  -re-orient -substance abuse counseling once able to participate   -thiamine, folic acid and multivitamin  Acute Respiratory Failure Intubation for airway protection but vomiting prior to arrival. Self-extubated today and appears to be protecting airway. Will require close monitoring for re-intubation if needed -DC antibiotics -Swallow evaluation post-extubation  Coffee ground  emesis - no further episodes -Decrease PPI to daily -Daily CBC  Hyponatremia, appears hypovolemic In setting of ETOH abuse  -Trend BMP  R flank mass/hematoma - improving likely hematoma raising concern for abdominal/chest wall trauma. -hold DVT chemoppx for now, type/screen, trend CBC -Continue to monitor  Hypomag - improved -Replete as needed  Best practice:  Diet: NPO Pain/Anxiety/Delirium protocol (if indicated): -- VAP protocol (if indicated): -- DVT prophylaxis: SCDs, lovenox GI prophylaxis: PPI BID Glucose control: SSI Mobility: bed level Code Status: Full  Family Communication: Attempted to call husband. No answer. Unable to leave VM on 3/22 Disposition: ICU  Labs   CBC: Recent Labs  Lab 08/05/19 1200 08/05/19 2314 08/06/19 0918 08/06/19 0918 08/06/19 1553 08/06/19 2130 08/07/19 0330 08/07/19 0807 08/08/19 0335  WBC 10.9*   < > 11.7*   < > 6.9 10.1 10.4 9.2 9.5  NEUTROABS 8.2*  --  10.0*  --   --   --   --   --   --   HGB 14.9   < > 12.4   < > 10.7* 12.2 10.9* 11.1* 10.9*  HCT 43.4   < > 35.4*   < > 31.1* 36.5 31.8* 32.8* 32.8*  MCV 95.6   < > 94.4   < > 94.0 99.2 96.4 96.5 97.9  PLT 338   < > 229   < > 172 169 158 188 184   < > = values in this interval not displayed.    Basic Metabolic Panel: Recent Labs  Lab 08/05/19 1200 08/05/19 2314 08/06/19 0037 08/06/19 0352 08/06/19 1553 08/07/19 0807 08/07/19 1024 08/07/19 1720 08/08/19 0335  NA 124*   < > 129* 127* 128* 129*  --  130*  --   K 4.0   < > 4.1 3.9 3.0* 3.6  --  3.6  --   CL 90*  --  97* 96* 98 103  --  102  --   CO2 16*  --  19* 14* 19* 20*  --  19*  --   GLUCOSE 87  --  94 78 211* 182*  --  122*  --   BUN 8  --  9 11 9 8   --  9  --   CREATININE 1.05*  --  1.06* 1.17* 1.04* 1.01*  --  0.91  --   CALCIUM 9.2  --  8.1* 8.8* 8.4* 8.3*  --  8.5*  --   MG 1.8  --  1.5*  --   --   --  1.6* 2.1 2.0  PHOS  --   --  2.9  --   --   --  2.5 2.9 3.7   < > = values in this interval not  displayed.   GFR: Estimated Creatinine Clearance: 56.8 mL/min (by C-G formula based on SCr of 0.91 mg/dL). Recent Labs  Lab 08/05/19 1402 08/06/19 0037 08/06/19 1020 08/06/19 1310 08/06/19 1553 08/06/19 2130 08/07/19 0330 08/07/19 0807 08/08/19 0335  WBC  --    < >  --   --    < > 10.1 10.4 9.2 9.5  LATICACIDVEN 2.7*  --  1.3  1.5  --   --   --   --   --    < > = values in this interval not displayed.    Liver Function Tests: Recent Labs  Lab 08/05/19 1200 08/06/19 0352  AST 57* 79*  ALT 25 33  ALKPHOS 93 73  BILITOT 1.1 1.6*  PROT 8.1 6.6  ALBUMIN 3.7 2.9*   Recent Labs  Lab 08/05/19 1200  LIPASE 69*   Recent Labs  Lab 08/06/19 0035  AMMONIA 30    ABG    Component Value Date/Time   PHART 7.533 (H) 08/05/2019 2314   PCO2ART 24.7 (L) 08/05/2019 2314   PO2ART 196.0 (H) 08/05/2019 2314   HCO3 20.8 08/05/2019 2314   TCO2 22 08/05/2019 2314   ACIDBASEDEF 1.0 08/05/2019 2314   O2SAT 100.0 08/05/2019 2314     Coagulation Profile: Recent Labs  Lab 08/05/19 1200  INR 1.0    Cardiac Enzymes: No results for input(s): CKTOTAL, CKMB, CKMBINDEX, TROPONINI in the last 168 hours.  HbA1C: Hgb A1c MFr Bld  Date/Time Value Ref Range Status  03/26/2015 09:21 AM 5.3 4.8 - 5.6 % Final    Comment:             Pre-diabetes: 5.7 - 6.4          Diabetes: >6.4          Glycemic control for adults with diabetes: <7.0     CBG: Recent Labs  Lab 08/07/19 1532 08/07/19 1947 08/07/19 2352 08/08/19 0405 08/08/19 0741  GLUCAP 89 84 117* 106* 105*    Critical care time: 40 min    The patient is critically ill with multiple organ systems failure and requires high complexity decision making for assessment and support, frequent evaluation and titration of therapies, application of advanced monitoring technologies and extensive interpretation of multiple databases.   Critical Care Time devoted to patient care services described in this note is 40 Minutes.   Rodman Pickle, M.D. [facility] Pulmonary/Critical Care Medicine 08/08/2019 9:08 AM   Please see Amion for pager number to reach on-call Pulmonary and Critical Care Team.

## 2019-08-08 NOTE — Progress Notes (Signed)
2174 pt self extubated. RN & MD at bedside. Pt placed on 2L [place], Spo2 100%. Pt does not seem to be in distress at this time. RT will continue to monitor

## 2019-08-08 NOTE — Progress Notes (Signed)
Nutrition Follow-up  DOCUMENTATION CODES:   Not applicable  INTERVENTION:   Begin TF after Cortrak tube is placed: Osmolite 1.2 at 20 ml/h, increase by 10 ml every 4 hours to goal rate of 60 ml/h to provide 1728 kcal, 80 gm protein, 1181 ml free water daily.  NUTRITION DIAGNOSIS:   Inadequate oral intake related to inability to eat as evidenced by NPO status.  Ongoing   GOAL:   Patient will meet greater than or equal to 90% of their needs  Progressing with Cortrak placement and TF initiation today  MONITOR:   TF tolerance, Weight trends, Labs, Vent status  ASSESSMENT:   Pt with a PMH significant for CVA, HTN, HLD, and polysubstance abuse presented to the ED with acute metabolic encephalopathy and actively seizing  Discussed patient in ICU rounds and with RN today. EEG 3/22 negative for seizures. Patient self-extubated this morning. Remains encephalopathic. S/P swallow evaluation with SLP this morning. Remains NPO per SLP recs. Cortrak tube has been ordered. Plans to begin TF today.  Labs reviewed. Sodium 130 (L) CBG's: 106-105-128  Medications reviewed and include folic acid, novolog, MVI, thiamine.  Weight varied since admission. I/O +6.5 L.  Diet Order:   Diet Order            Diet NPO time specified  Diet effective now              EDUCATION NEEDS:   Not appropriate for education at this time  Skin:  Skin Assessment: Reviewed RN Assessment  Last BM:  3/22  Height:   Ht Readings from Last 1 Encounters:  08/05/19 5\' 4"  (1.626 m)    Weight:   Wt Readings from Last 1 Encounters:  08/08/19 61.3 kg    BMI:  Body mass index is 23.2 kg/m.  Estimated Nutritional Needs:   Kcal:  1550-1750  Protein:  75-85 gm  Fluid:  >/= 1.5 L    08/10/19, RD, LDN, CNSC Please refer to Amion for contact information.

## 2019-08-08 NOTE — Procedures (Signed)
Cortrak  Person Inserting Tube:  Menucha Dicesare M, RD Tube Type:  Cortrak - 43 inches Tube Location:  Right nare Initial Placement:  Stomach Secured by: Bridle Technique Used to Measure Tube Placement:  Documented cm marking at nare/ corner of mouth Cortrak Secured At:  65 cm Procedure Comments:  Cortrak Tube Team Note:  Consult received to place a Cortrak feeding tube.   No x-ray is required. RN may begin using tube.   If the tube becomes dislodged please keep the tube and contact the Cortrak team at [email or web address] ([email or web address]) for replacement.  If after hours and replacement cannot be delayed, place a NG tube and confirm placement with an abdominal x-ray.      Vila Dory, MS, RD, LDN, CNSC Inpatient Clinical Dietitian RD pager # available in AMION  After hours/weekend pager # available in AMION      

## 2019-08-08 NOTE — Evaluation (Signed)
Clinical/Bedside Swallow Evaluation Patient Details  Name: Cindy [name] MRN: [id number] Date of Birth: Jul 08, 1960  Today's Date: 08/08/2019 Time: SLP Start Time (ACUTE ONLY): 1145 SLP Stop Time (ACUTE ONLY): 1155 SLP Time Calculation (min) (ACUTE ONLY): 10 min  Past Medical History:  Past Medical History:  Diagnosis Date  . Abnormal LFTs    Hx of  . CVA (cerebral vascular accident) (HCC) 2005  . Hyperlipidemia   . Hypertension   . Polysubstance dependence including opioid type drug, episodic abuse (HCC)    cocaine/etoh    Past Surgical History: History reviewed. No pertinent surgical history. HPI:  59 y/o F who presented to [facility] 3/19 with reports of altered mental status. Active seizures on arrival to ER.  Hx of polysubstance abuse. Intubated from 3/19-3/22.    Assessment / Plan / Recommendation Clinical Impression  Pt demonstrates immediate coughing with tachycardia when given minimal Po trials. She is dysphonic following 3-4 day intubation with extubation earlier this am. Her breathing swallowing pattern is impaired with instances of deep inspiration following swallowing, putting her at increased risk of aspiration. Recommend increased time post extubation with trials of PO to assess readiness vs need for instrumental testing. Will f/u at bedside regularly throughout the week.  SLP Visit Diagnosis: Dysphagia, oropharyngeal phase (R13.12)    Aspiration Risk  Moderate aspiration risk    Diet Recommendation NPO;Alternative means - temporary        Other  Recommendations     Follow up Recommendations Skilled Nursing facility      Frequency and Duration min 2x/week  2 weeks       Prognosis Prognosis for Safe Diet Advancement: Fair      Swallow Study   General HPI: 59 y/o F who presented to [facility] 3/19 with reports of altered mental status. Active seizures on arrival to ER.  Hx of polysubstance abuse. Intubated from 3/19-3/22.  Type of Study: Bedside Swallow  Evaluation Previous Swallow Assessment: none Diet Prior to this Study: NPO Temperature Spikes Noted: No Respiratory Status: Nasal cannula History of Recent Intubation: Yes Length of Intubations (days): 4 days Date extubated: 08/08/19 Behavior/Cognition: Alert;Requires cueing;Distractible Oral Cavity Assessment: Within Functional Limits Oral Care Completed by SLP: No Oral Cavity - Dentition: Edentulous Self-Feeding Abilities: Total assist Patient Positioning: Postural control interferes with function Baseline Vocal Quality: Hoarse Volitional Cough: Congested Volitional Swallow: Unable to elicit    Oral/Motor/Sensory Function Overall Oral Motor/Sensory Function: Generalized oral weakness   Ice Chips Ice chips: Within functional limits   Thin Liquid Thin Liquid: Impaired Presentation: Cup;Straw Pharyngeal  Phase Impairments: Cough - Immediate    Nectar Thick Nectar Thick Liquid: Not tested   Honey Thick Honey Thick Liquid: Not tested   Puree Puree: Impaired Pharyngeal Phase Impairments: Cough - Immediate   Solid     Solid: Not tested     Harlon Ditty, MA CCC-SLP  Acute Rehabilitation Services Pager [phone] Office [phone]  Shawntia Mangal, Riley Nearing 08/08/2019,1:58 PM

## 2019-08-09 DIAGNOSIS — F10231 Alcohol dependence with withdrawal delirium: Secondary | ICD-10-CM

## 2019-08-09 LAB — GLUCOSE, CAPILLARY
Glucose-Capillary: 133 mg/dL — ABNORMAL HIGH (ref 70–99)
Glucose-Capillary: 119 mg/dL — ABNORMAL HIGH (ref 70–99)
Glucose-Capillary: 135 mg/dL — ABNORMAL HIGH (ref 70–99)
Glucose-Capillary: 121 mg/dL — ABNORMAL HIGH (ref 70–99)
Glucose-Capillary: 143 mg/dL — ABNORMAL HIGH (ref 70–99)

## 2019-08-09 LAB — HEPATIC FUNCTION PANEL
ALT: 27 U/L (ref 0–44)
AST: 29 U/L (ref 15–41)
Albumin: 2.1 g/dL — ABNORMAL LOW (ref 3.5–5.0)
Alkaline Phosphatase: 56 U/L (ref 38–126)
Bilirubin, Direct: 0.2 mg/dL (ref 0.0–0.2)
Indirect Bilirubin: 0.5 mg/dL (ref 0.3–0.9)
Total Bilirubin: 0.7 mg/dL (ref 0.3–1.2)
Total Protein: 5.6 g/dL — ABNORMAL LOW (ref 6.5–8.1)

## 2019-08-09 LAB — CBC
HCT: 31.6 % — ABNORMAL LOW (ref 36.0–46.0)
Hemoglobin: 10.6 g/dL — ABNORMAL LOW (ref 12.0–15.0)
MCH: 32.3 pg (ref 26.0–34.0)
MCHC: 33.5 g/dL (ref 30.0–36.0)
MCV: 96.3 fL (ref 80.0–100.0)
Platelets: 185 10*3/uL (ref 150–400)
RBC: 3.28 MIL/uL — ABNORMAL LOW (ref 3.87–5.11)
RDW: 12.3 % (ref 11.5–15.5)
WBC: 8.8 10*3/uL (ref 4.0–10.5)
nRBC: 0 % (ref 0.0–0.2)

## 2019-08-09 LAB — BASIC METABOLIC PANEL
Anion gap: 8 (ref 5–15)
BUN: 12 mg/dL (ref 6–20)
CO2: 23 mmol/L (ref 22–32)
Calcium: 8.4 mg/dL — ABNORMAL LOW (ref 8.9–10.3)
Chloride: 101 mmol/L (ref 98–111)
Creatinine, Ser: 0.94 mg/dL (ref 0.44–1.00)
GFR calc Af Amer: >60 mL/min (ref >60)
GFR calc non Af Amer: >60 mL/min (ref >60)
Glucose, Bld: 137 mg/dL — ABNORMAL HIGH (ref 70–99)
Potassium: 3.3 mmol/L — ABNORMAL LOW (ref 3.5–5.1)
Sodium: 132 mmol/L — ABNORMAL LOW (ref 135–145)

## 2019-08-09 LAB — PROTIME-INR
INR: 1.1 (ref 0.8–1.2)
Prothrombin Time: 14.1 seconds (ref 11.4–15.2)

## 2019-08-09 LAB — AMMONIA: Ammonia: 23 umol/L (ref 9–35)

## 2019-08-09 MED ORDER — FOLIC ACID 1 MG PO TABS
1.0000 mg | ORAL_TABLET | Freq: Every day | ORAL | Status: DC
  Administered 2019-08-09 – 2019-08-12 (×4): 1 mg
  Filled 2019-08-09 (×4): qty 1

## 2019-08-09 MED ORDER — METOPROLOL TARTRATE 5 MG/5ML IV SOLN
5.0000 mg | Freq: Four times a day (QID) | INTRAVENOUS | Status: DC | PRN
  Administered 2019-08-09 – 2019-08-10 (×2): 5 mg via INTRAVENOUS
  Filled 2019-08-09 (×2): qty 5

## 2019-08-09 MED ORDER — AMLODIPINE BESYLATE 10 MG PO TABS
10.0000 mg | ORAL_TABLET | Freq: Every day | ORAL | Status: DC
  Administered 2019-08-09 – 2019-08-10 (×2): 10 mg
  Filled 2019-08-09 (×2): qty 1

## 2019-08-09 MED ORDER — ADULT MULTIVITAMIN LIQUID CH
15.0000 mL | Freq: Every day | ORAL | Status: DC
  Administered 2019-08-09 – 2019-08-17 (×9): 15 mL
  Filled 2019-08-09 (×10): qty 15

## 2019-08-09 MED ORDER — THIAMINE HCL 100 MG PO TABS
100.0000 mg | ORAL_TABLET | Freq: Every day | ORAL | Status: DC
  Administered 2019-08-09 – 2019-08-12 (×4): 100 mg
  Filled 2019-08-09 (×4): qty 1

## 2019-08-09 MED ORDER — POTASSIUM CHLORIDE 20 MEQ/15ML (10%) PO SOLN
20.0000 meq | ORAL | Status: AC
  Administered 2019-08-09 (×2): 20 meq
  Filled 2019-08-09 (×2): qty 15

## 2019-08-09 MED ORDER — PANTOPRAZOLE SODIUM 40 MG PO PACK
40.0000 mg | PACK | Freq: Every day | ORAL | Status: DC
  Filled 2019-08-09: qty 20

## 2019-08-09 NOTE — Evaluation (Signed)
Physical Therapy Evaluation Patient Details Name: Cindy [name] MRN: [id number] DOB: Oct 21, 1960 Today's Date: 08/09/2019   History of Present Illness  59 y/o F who presented to [facility] 3/19 with reports of altered mental status. Active seizures on arrival to ER.  Hx of polysubstance abuse. Intubated from 3/19-3/22.  Clinical Impression  Pt admitted with above diagnosis. Pt was able to sit EOB 10 min with max assist as pt with poor sitting balance/poor trunk control and head control.  Fatigues quickly.  Will need Rehab to achieve maximal function.  Pt currently with functional limitations due to the deficits listed below (see PT Problem List). Pt will benefit from skilled PT to increase their independence and safety with mobility to allow discharge to the venue listed below.      Follow Up Recommendations CIR;Supervision/Assistance - 24 hour    Equipment Recommendations  Other (comment)(tBA)    Recommendations for Other Services Rehab consult     Precautions / Restrictions Precautions Precautions: Fall Restrictions Weight Bearing Restrictions: No      Mobility  Bed Mobility Overal bed mobility: Needs Assistance Bed Mobility: Supine to Sit     Supine to sit: Max assist;+2 for physical assistance     General bed mobility comments: Pt was able to come to EOB with max assist needing assist for LES and trunk.  Once up, pt with posterior lean as well as side to side lean. Difficulty with balance at EOB.    Transfers                 General transfer comment: Unable at present  Ambulation/Gait             General Gait Details: unable at present  Stairs            Wheelchair Mobility    Modified Rankin (Stroke Patients Only)       Balance Overall balance assessment: Needs assistance Sitting-balance support: No upper extremity supported;Feet supported;Bilateral upper extremity supported Sitting balance-Leahy Scale: Poor Sitting balance - Comments: Pt needs  max assist to sit EOB for 10 minutes. Leans all direcrtions and needs assist to hold head up.  Initiates movement of trunk and neck to command but cannot sustain.   Postural control: Posterior lean;Right lateral lean;Left lateral lean(anterior lean)                                   Pertinent Vitals/Pain Pain Assessment: Faces Faces Pain Scale: Hurts even more Pain Location: UE and LE Pain Descriptors / Indicators: Grimacing;Guarding Pain Intervention(s): Limited activity within patient's tolerance;Monitored during session;Repositioned    Home Living Family/patient expects to be discharged to:: Private residence Living Arrangements: Spouse/significant other Available Help at Discharge: Family;Available 24 hours/day Type of Home: House Home Access: Stairs to enter   [employer] of Steps: 1 Home Layout: One level Home Equipment: Cane - single point;Shower seat      Prior Function Level of Independence: Independent               Hand Dominance        Extremity/Trunk Assessment   Upper Extremity Assessment Upper Extremity Assessment: Defer to OT evaluation    Lower Extremity Assessment Lower Extremity Assessment: Generalized weakness    Cervical / Trunk Assessment Cervical / Trunk Assessment: Kyphotic;Other exceptions(Neck significantly flexed as well.)  Communication   Communication: Other (comment)(speaks in whisper or shakes head to yes/no- ? reliability)  Cognition Arousal/Alertness: Awake/alert Behavior During Therapy: Flat affect Overall Cognitive Status: Impaired/Different from baseline Area of Impairment: Orientation;Safety/judgement;Problem solving;Following commands                 Orientation Level: Disoriented to;Situation;Time;Place(knew her birhtday)     Following Commands: Follows one step commands with increased time Safety/Judgement: Decreased awareness of safety;Decreased awareness of deficits   Problem  Solving: Slow processing;Difficulty sequencing;Requires verbal cues;Requires tactile cues        General Comments General comments (skin integrity, edema, etc.): 107-130 bpm, BP 149/79 initially and 159/85 at end of treatment, O2 99% on 2L.      Exercises     Assessment/Plan    PT Assessment Patient needs continued PT services  PT Problem List Decreased balance;Decreased activity tolerance;Decreased mobility;Decreased knowledge of use of DME;Decreased safety awareness;Decreased knowledge of precautions;Decreased strength       PT Treatment Interventions DME instruction;Functional mobility training;Gait training;Therapeutic activities;Therapeutic exercise;Balance training;Patient/family education    PT Goals (Current goals can be found in the Care Plan section)  Acute Rehab PT Goals Patient Stated Goal: to go home PT Goal Formulation: With patient Time For Goal Achievement: 08/23/19 Potential to Achieve Goals: Good    Frequency Min 3X/week   Barriers to discharge        Co-evaluation PT/OT/SLP Co-Evaluation/Treatment: Yes Reason for Co-Treatment: Complexity of the patient's impairments (multi-system involvement);For patient/therapist safety PT goals addressed during session: Mobility/safety with mobility         AM-PAC PT "6 Clicks" Mobility  Outcome Measure Help needed turning from your back to your side while in a flat bed without using bedrails?: Total Help needed moving from lying on your back to sitting on the side of a flat bed without using bedrails?: Total Help needed moving to and from a bed to a chair (including a wheelchair)?: Total Help needed standing up from a chair using your arms (e.g., wheelchair or bedside chair)?: Total Help needed to walk in hospital room?: Total Help needed climbing 3-5 steps with a railing? : Total 6 Click Score: 6    End of Session Equipment Utilized During Treatment: Gait belt;Oxygen Activity Tolerance: Patient limited by  fatigue Patient left: with call bell/phone within reach;in bed;with bed alarm set;with restraints reapplied;with SCD's reapplied(in chair position) Nurse Communication: Mobility status PT Visit Diagnosis: Muscle weakness (generalized) (M62.81)    Time: 7322-0254 PT Time Calculation (min) (ACUTE ONLY): 17 min   Charges:   PT Evaluation $PT Eval Moderate Complexity: 1 Mod          Cindy [name],PT Acute Rehabilitation Services Pager:  [phone]  Office:  [phone]    Cindy [name] 08/09/2019, 1:40 PM

## 2019-08-09 NOTE — Progress Notes (Signed)
SLP Cancellation Note  Patient Details Name: Cindy [name] MRN: [id number] DOB: [birth year]   Cancelled treatment:       Reason Eval/Treat Not Completed: Fatigue/lethargy limiting ability to participate. Pt lethargic this afternoon per RN, still very hoarse. She recommends holding for another day. Will f/u as able.    Mahala Menghini., M.A. CCC-SLP Acute Rehabilitation Services Pager [phone] Office [phone]  08/09/2019, 2:50 PM

## 2019-08-09 NOTE — Progress Notes (Signed)
Rehab Admissions Coordinator Note:  Per PT recommendation, this patient was screened by Cheri Rous for appropriateness for an Inpatient Acute Rehab Consult.  At this time, we will follow for progress with therapies prior to recommending consult.   Cheri Rous 08/09/2019, 1:58 PM  I can be reached at [phone].

## 2019-08-09 NOTE — Evaluation (Signed)
Occupational Therapy Evaluation Patient Details Name: Cindy [name] MRN: [id number] DOB: 08-07-1960 Today's Date: 08/09/2019    History of Present Illness 59 y/o F who presented to [facility] 3/19 with reports of altered mental status. Active seizures on arrival to ER.  Hx of polysubstance abuse. Intubated from 3/19-3/22.   Clinical Impression   This 59 y/o female presents with the above. PTA pt reports independence with ADL and functional mobility (though unsure of accuracy of pt full report re: PLOF/home setup). Pt currently presenting with notable weakness, impaired cognition, decreased sitting balance and mobility status. Pt currently requires two person assist for safe completion of bed mobility, tolerating sitting EOB approx 10 min during this session however requiring overall maxA for static balance. Pt with notable UE and head/neck weakness as pt requires assist to maintain head upright while seated EOB. Pt mildly impulsive with lines, will initiate following simple commands but with difficulty carrying out command in full. She will benefit from continued acute OT services and recommend post acute rehab services to maximize her safety and independence with ADL and mobility. Will follow.     Follow Up Recommendations  CIR;Supervision/Assistance - 24 hour    Equipment Recommendations  Other (comment)(TBD)           Precautions / Restrictions Precautions Precautions: Fall Restrictions Weight Bearing Restrictions: No      Mobility Bed Mobility Overal bed mobility: Needs Assistance Bed Mobility: Supine to Sit;Sit to Supine     Supine to sit: Max assist;+2 for physical assistance Sit to supine: Max assist;+2 for physical assistance   General bed mobility comments: Pt was able to come to EOB with max assist needing assist for LES and trunk.  Once up, pt with posterior lean as well as side to side lean. Difficulty with balance at EOB.    Transfers                 General  transfer comment: Unable at present    Balance Overall balance assessment: Needs assistance Sitting-balance support: No upper extremity supported;Feet supported;Bilateral upper extremity supported Sitting balance-Leahy Scale: Poor Sitting balance - Comments: Pt needs max assist to sit EOB for 10 minutes. Leans all direcrtions and needs assist to hold head up.  Initiates movement of trunk and neck to command but cannot sustain.   Postural control: Posterior lean;Right lateral lean;Left lateral lean(anterior lean)                                 ADL either performed or assessed with clinical judgement   ADL Overall ADL's : Needs assistance/impaired                                       General ADL Comments: currently totalA given significant weakness, pt also with impaired cognition, decreased activity tolerance                         Pertinent Vitals/Pain Pain Assessment: Faces Faces Pain Scale: Hurts even more Pain Location: UE and LE Pain Descriptors / Indicators: Grimacing;Guarding Pain Intervention(s): Limited activity within patient's tolerance;Monitored during session;Repositioned     Hand Dominance     Extremity/Trunk Assessment Upper Extremity Assessment Upper Extremity Assessment: Generalized weakness   Lower Extremity Assessment Lower Extremity Assessment: Defer to PT evaluation   Cervical /  Trunk Assessment Cervical / Trunk Assessment: Kyphotic;Other exceptions(Neck significantly flexed as well.) Cervical / Trunk Exceptions: significant cervical weakness, unable to hold head up without assist    Communication Communication Communication: Other (comment)(speaks in whisper or shakes head to yes/no- ? reliability)   Cognition Arousal/Alertness: Awake/alert Behavior During Therapy: Flat affect Overall Cognitive Status: Impaired/Different from baseline Area of Impairment: Orientation;Safety/judgement;Problem solving;Following  commands                 Orientation Level: Disoriented to;Situation;Time;Place(knew her birhtday)     Following Commands: Follows one step commands with increased time Safety/Judgement: Decreased awareness of safety;Decreased awareness of deficits   Problem Solving: Slow processing;Difficulty sequencing;Requires verbal cues;Requires tactile cues General Comments: very soft spoken, will initiate movements when instructed to do so but with difficulty carrying out fully    General Comments  107-130 bpm, BP 149/79 initially and 159/85 at end of treatment, O2 99% on 2L.      Exercises     Shoulder Instructions      Home Living Family/patient expects to be discharged to:: Private residence Living Arrangements: Spouse/significant other Available Help at Discharge: Family;Available 24 hours/day Type of Home: House Home Access: Stairs to enter [employer] of Steps: 1   Home Layout: One level     Bathroom Shower/Tub: Chief Strategy Officer: Standard     Home Equipment: Cane - single point;Shower seat   Additional Comments: unsure of accuracy of home setup as pt with impaired cognition today, very soft spoken      Prior Functioning/Environment Level of Independence: Independent                 OT Problem List: Decreased strength;Decreased range of motion;Decreased activity tolerance;Impaired balance (sitting and/or standing);Decreased cognition;Decreased safety awareness;Decreased knowledge of use of DME or AE      OT Treatment/Interventions: Self-care/ADL training;Therapeutic exercise;Energy conservation;DME and/or AE instruction;Therapeutic activities;Cognitive remediation/compensation;Patient/family education;Balance training    OT Goals(Current goals can be found in the care plan section) Acute Rehab OT Goals Patient Stated Goal: to go home OT Goal Formulation: With patient Time For Goal Achievement: 08/23/19 Potential to Achieve Goals:  Good  OT Frequency: Min 2X/week   Barriers to D/C:            Co-evaluation PT/OT/SLP Co-Evaluation/Treatment: Yes Reason for Co-Treatment: Complexity of the patient's impairments (multi-system involvement);For patient/therapist safety   OT goals addressed during session: Strengthening/ROM      AM-PAC OT "6 Clicks" Daily Activity     Outcome Measure Help from another person eating meals?: Total Help from another person taking care of personal grooming?: Total Help from another person toileting, which includes using toliet, bedpan, or urinal?: Total Help from another person bathing (including washing, rinsing, drying)?: Total Help from another person to put on and taking off regular upper body clothing?: Total Help from another person to put on and taking off regular lower body clothing?: Total 6 Click Score: 6   End of Session Nurse Communication: Mobility status  Activity Tolerance: Patient tolerated treatment well;Patient limited by fatigue Patient left: in bed;with call bell/phone within reach;with bed alarm set;with restraints reapplied;with nursing/sitter in room  OT Visit Diagnosis: Muscle weakness (generalized) (M62.81);Other abnormalities of gait and mobility (R26.89);Other symptoms and signs involving cognitive function                Time: 0312-8118 OT Time Calculation (min): 16 min Charges:  OT General Charges $OT Visit: 1 Visit OT Evaluation $OT Eval Moderate Complexity: 1  Mod  Marcy Siren, OT Acute Rehabilitation Services Pager [phone] Office [phone]   Orlando Penner 08/09/2019, 4:16 PM

## 2019-08-09 NOTE — Progress Notes (Signed)
Elink replacement protocol  Potasium 3.3 0.73ml/kg/6h UOP GFR>60 Replaced: Potassium chloride 29meq/15ml solution per tube q4 hours for 2 doses.

## 2019-08-09 NOTE — Progress Notes (Addendum)
NAME:  Cindy [name], MRN:  [id number], DOB:  Jul 24, 1960, LOS: 4 ADMISSION DATE:  08/05/2019, CONSULTATION DATE:  08/05/19 REFERRING MD:  Dr. Deretha Emory, CHIEF COMPLAINT:  AMS    Brief History   59 y/o F who presented to [facility] 3/19 with reports of altered mental status. Active seizures on arrival to ER.  Hx of polysubstance abuse.   History of present illness   59 y/o F who presented to [facility] on 3/19 via EMS with reports of AMS.    The patient was actively seizing on arrival to ER.  She was noted to have eye deviation to the right with twitching of the face / unresponsive. There were concerns of vomiting prior to arrival.  She was treated with IV ativan on arrival and keppra.  The patient was intubated for airway protection. UDS was noted to be cocaine positive.  Salicylate level <7, ETOH 21.  CXR with ETT in appropriate position, no infiltrate. Initial labs - Na 124, K 4, Cl 90, CO2 16, glucose 87, BUN 8, Sr Cr 1.08, AG 18, lipase 69, AST 57, ALT 25, BNP 121, troponin 33, WBC 10.9, Hgb 14.9 and platelets 338.    The patient was transferred to [facility] for further evaluation.   Past Medical History  CVA - 2005 HTN HLD  Abnormal LFT's  Polysubstance Abuse   Significant Hospital Events   3/19 Admit with AMS, active seizures on arrival, intubated  3/22 Self-extubated  Consults:  PCCM   Procedures:  ETT 3/19 >> 3/22  Significant Diagnostic Tests:   CT Head 3/19 >> no acute intracranial pathology, extensive periventricular and deep white matter hypodensity markedly increased compared to remote prior CT 2006.    EEG 3/22 Negative for seizures  Micro Data:  UA 3/19 >> glucose >500, 5 ketones, 100 protein, rare bacteria, 0-5 WBC Resp culture 3/19>Few GPC  Antimicrobials:  Ceftriaxone 3/20-3/21 Azithro 3/20-3/21  Interim history/subjective:  Self-extubated yesterday. Off Precedex.   Objective   Blood pressure (!) 141/80, pulse (!) 111, temperature 99.1 F (37.3 C), temperature source  Oral, resp. rate (!) 22, height 5\' 4"  (1.626 m), weight 60.9 kg, SpO2 100 %.        Intake/Output Summary (Last 24 hours) at 08/09/2019 08/11/2019 Last data filed at 08/09/2019 0900 Gross per 24 hour  Intake 651.61 ml  Output 950 ml  Net -298.39 ml   Filed Weights   08/07/19 0500 08/08/19 0348 08/09/19 0451  Weight: 62.9 kg 61.3 kg 60.9 kg   Physical Exam: General: Chronically ill-appearing, encephalopathic HENT: [place]on [place], AT, OP clear, MMM, Dobhoff in place Eyes: EOMI, no scleral icterus Respiratory: Coarse breath sounds  No crackles, wheezing or rales Cardiovascular: RRR, -M/R/G, no JVD GI: BS+, soft, nontender Extremities:-Edema,-tenderness Neuro: Eyes open spontaneously, intermittently/inconsistently follows commands, moves extremities x 4 Skin: Improved flank bruising. R flank with indurated hematoma, stable.  Resolved Hospital Problem list    AKI  Assessment & Plan:   Acute Metabolic Encephalopathy   Polysubstance abuse Alcohol withdrawal a possibility. Multiple metabolic derangements have either lowered threshold or precipitated SZ. UDS +cocaine and alcohol. Remaining tox panel negative. Ammonia wnl. Does have chart history of CVA and this may represent a focus for SZ activity. CT head with no acute intracranial abnormalities. EEG negative -CIWA protocol -MRI without contrast -Obtain hepatic function, ammonia -seizure precautions  -minimize sedation  -substance abuse counseling once able to participate   -thiamine, folic acid and multivitamin  Acute Respiratory Failure Intubation for airway protection but vomiting  prior to arrival. Self-extubated 3/22 and appears to be protecting airway. -Maintain O2 sats >88%  Coffee ground emesis - no further episodes -Decrease PPI to daily -Daily CBC  Hyponatremia, appears hypovolemic In setting of ETOH abuse  -Trend BMP  R flank mass/hematoma - improving likely hematoma raising concern for abdominal/chest wall trauma. -hold DVT  chemoppx for now, type/screen, trend CBC -Continue to monitor  Hypokalemia  -Replete  Hypomag - improved -Replete as needed  Best practice:  Diet: NPO Pain/Anxiety/Delirium protocol (if indicated): -- VAP protocol (if indicated): -- DVT prophylaxis: SCDs, lovenox GI prophylaxis: PPI BID Glucose control: SSI Mobility: bed level Code Status: Full  Family Communication: Attempted to call husband. No answer. Unable to leave VM on 3/22 Disposition: ICU  Labs   CBC: Recent Labs  Lab 08/05/19 1200 08/05/19 2314 08/06/19 0918 08/06/19 1553 08/06/19 2130 08/07/19 0330 08/07/19 0807 08/08/19 0335 08/09/19 0218  WBC 10.9*   < > 11.7*   < > 10.1 10.4 9.2 9.5 8.8  NEUTROABS 8.2*  --  10.0*  --   --   --   --   --   --   HGB 14.9   < > 12.4   < > 12.2 10.9* 11.1* 10.9* 10.6*  HCT 43.4   < > 35.4*   < > 36.5 31.8* 32.8* 32.8* 31.6*  MCV 95.6   < > 94.4   < > 99.2 96.4 96.5 97.9 96.3  PLT 338   < > 229   < > 169 158 188 184 185   < > = values in this interval not displayed.    Basic Metabolic Panel: Recent Labs  Lab 08/05/19 1200 08/05/19 2314 08/06/19 0037 08/06/19 0352 08/06/19 1553 08/07/19 0807 08/07/19 1024 08/07/19 1720 08/08/19 0335 08/08/19 0915 08/09/19 0218  NA 124*   < > 129*   < > 128* 129*  --  130*  --  130* 132*  K 4.0   < > 4.1   < > 3.0* 3.6  --  3.6  --  3.5 3.3*  CL 90*  --  97*   < > 98 103  --  102  --  104 101  CO2 16*  --  19*   < > 19* 20*  --  19*  --  20* 23  GLUCOSE 87  --  94   < > 211* 182*  --  122*  --  151* 137*  BUN 8  --  9   < > 9 8  --  9  --  14 12  CREATININE 1.05*  --  1.06*   < > 1.04* 1.01*  --  0.91  --  1.00 0.94  CALCIUM 9.2  --  8.1*   < > 8.4* 8.3*  --  8.5*  --  8.4* 8.4*  MG 1.8  --  1.5*  --   --   --  1.6* 2.1 2.0  --   --   PHOS  --   --  2.9  --   --   --  2.5 2.9 3.7  --   --    < > = values in this interval not displayed.   GFR: Estimated Creatinine Clearance: 55 mL/min (by C-G formula based on SCr of 0.94  mg/dL). Recent Labs  Lab 08/05/19 1402 08/06/19 0037 08/06/19 1020 08/06/19 1310 08/06/19 1553 08/07/19 0330 08/07/19 0807 08/08/19 0335 08/09/19 0218  WBC  --    < >  --   --    < >  10.4 9.2 9.5 8.8  LATICACIDVEN 2.7*  --  1.3 1.5  --   --   --   --   --    < > = values in this interval not displayed.    Liver Function Tests: Recent Labs  Lab 08/05/19 1200 08/06/19 0352  AST 57* 79*  ALT 25 33  ALKPHOS 93 73  BILITOT 1.1 1.6*  PROT 8.1 6.6  ALBUMIN 3.7 2.9*   Recent Labs  Lab 08/05/19 1200  LIPASE 69*   Recent Labs  Lab 08/06/19 0035  AMMONIA 30    ABG    Component Value Date/Time   PHART 7.533 (H) 08/05/2019 2314   PCO2ART 24.7 (L) 08/05/2019 2314   PO2ART 196.0 (H) 08/05/2019 2314   HCO3 20.8 08/05/2019 2314   TCO2 22 08/05/2019 2314   ACIDBASEDEF 1.0 08/05/2019 2314   O2SAT 100.0 08/05/2019 2314     Coagulation Profile: Recent Labs  Lab 08/05/19 1200  INR 1.0    Cardiac Enzymes: No results for input(s): CKTOTAL, CKMB, CKMBINDEX, TROPONINI in the last 168 hours.  HbA1C: Hgb A1c MFr Bld  Date/Time Value Ref Range Status  03/26/2015 09:21 AM 5.3 4.8 - 5.6 % Final    Comment:             Pre-diabetes: 5.7 - 6.4          Diabetes: >6.4          Glycemic control for adults with diabetes: <7.0     CBG: Recent Labs  Lab 08/08/19 1508 08/08/19 1920 08/08/19 2319 08/09/19 0321 08/09/19 0737  GLUCAP 113* 119* 126* 133* 119*    Critical care time: 40 min    Rodman Pickle, M.D. [facility] Pulmonary/Critical Care Medicine 08/09/2019 10:48 AM

## 2019-08-10 ENCOUNTER — Inpatient Hospital Stay (HOSPITAL_COMMUNITY): Payer: Self-pay

## 2019-08-10 LAB — CBC
HCT: 35.4 % — ABNORMAL LOW (ref 36.0–46.0)
Hemoglobin: 12.4 g/dL (ref 12.0–15.0)
MCH: 33 pg (ref 26.0–34.0)
MCHC: 35 g/dL (ref 30.0–36.0)
MCV: 94.1 fL (ref 80.0–100.0)
Platelets: 237 10*3/uL (ref 150–400)
RBC: 3.76 MIL/uL — ABNORMAL LOW (ref 3.87–5.11)
RDW: 12.1 % (ref 11.5–15.5)
WBC: 8.3 10*3/uL (ref 4.0–10.5)
nRBC: 0 % (ref 0.0–0.2)

## 2019-08-10 LAB — GLUCOSE, CAPILLARY
Glucose-Capillary: 159 mg/dL — ABNORMAL HIGH (ref 70–99)
Glucose-Capillary: 133 mg/dL — ABNORMAL HIGH (ref 70–99)
Glucose-Capillary: 137 mg/dL — ABNORMAL HIGH (ref 70–99)
Glucose-Capillary: 175 mg/dL — ABNORMAL HIGH (ref 70–99)
Glucose-Capillary: 44 mg/dL — CL (ref 70–99)
Glucose-Capillary: 155 mg/dL — ABNORMAL HIGH (ref 70–99)
Glucose-Capillary: 157 mg/dL — ABNORMAL HIGH (ref 70–99)
Glucose-Capillary: 125 mg/dL — ABNORMAL HIGH (ref 70–99)

## 2019-08-10 LAB — BASIC METABOLIC PANEL
Anion gap: 10 (ref 5–15)
BUN: 7 mg/dL (ref 6–20)
CO2: 24 mmol/L (ref 22–32)
Calcium: 8.7 mg/dL — ABNORMAL LOW (ref 8.9–10.3)
Chloride: 97 mmol/L — ABNORMAL LOW (ref 98–111)
Creatinine, Ser: 0.82 mg/dL (ref 0.44–1.00)
GFR calc Af Amer: >60 mL/min (ref >60)
GFR calc non Af Amer: >60 mL/min (ref >60)
Glucose, Bld: 143 mg/dL — ABNORMAL HIGH (ref 70–99)
Potassium: 3.3 mmol/L — ABNORMAL LOW (ref 3.5–5.1)
Sodium: 131 mmol/L — ABNORMAL LOW (ref 135–145)

## 2019-08-10 IMAGING — MR MR HEAD W/O CM
6 of 10 series · 26 of 48 positions shown · non-contrast
Comparison: None.

CLINICAL DATA: Altered mental status.

EXAM:
MRI HEAD WITHOUT CONTRAST
TECHNIQUE: Multiplanar, multiecho pulse sequences of the brain and surrounding
structures were obtained without intravenous contrast.

[Series 3: DWI · axial · 3.0mm · 0.94mm/px · z∈[-74,+51]mm · 8 of 100 slices shown (1 of 2)]
[im 1/100]
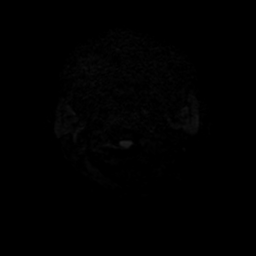
[im 12/100]
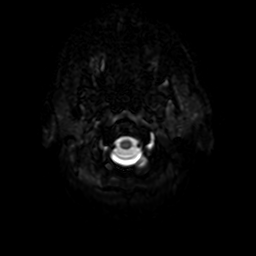
[im 34/100]
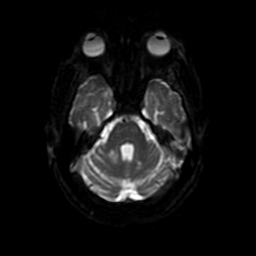
[im 45/100]
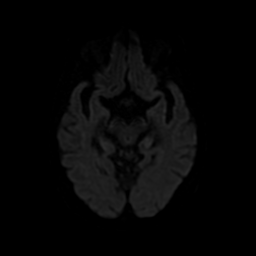
[im 56/100]
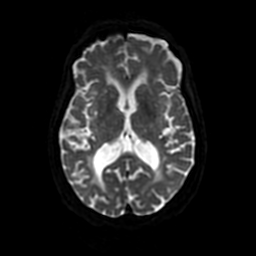
[im 67/100]
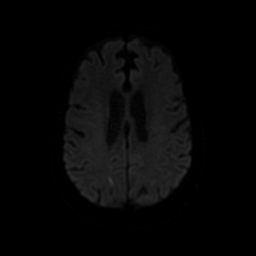
[im 89/100]
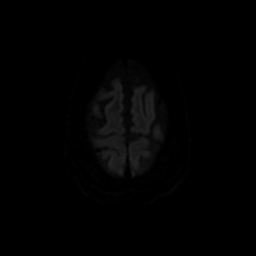
[im 100/100]
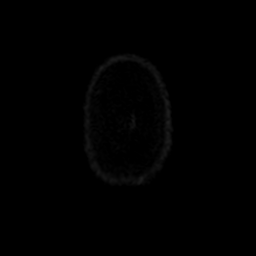

[Series 4: DWI · coronal · 4.0mm · 0.94mm/px · 7 of 74 slices shown (2 of 2)]
[im 1/74]
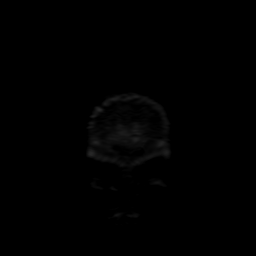
[im 13/74]
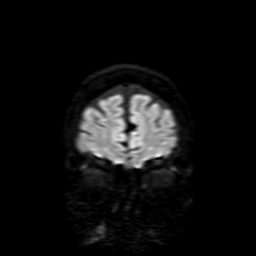
[im 25/74]
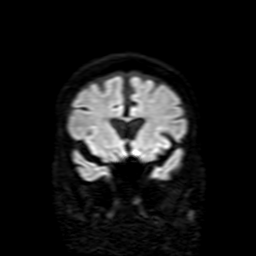
[im 37/74]
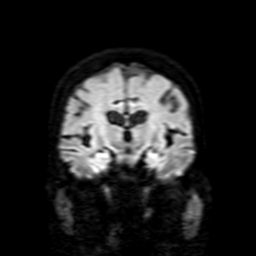
[im 49/74]
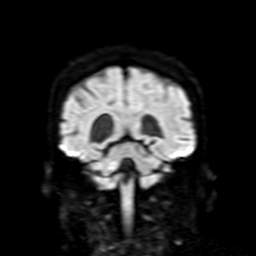
[im 61/74]
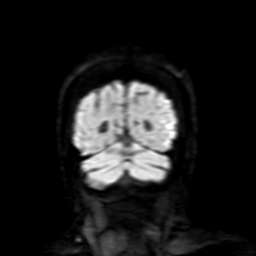
[im 74/74]
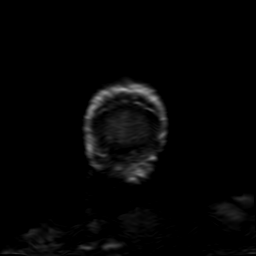

[Series 5: FLAIR · sagittal · 5.0mm · 0.23mm/px · 2 of 23 slices shown (1 of 2)]
[im 1/23]
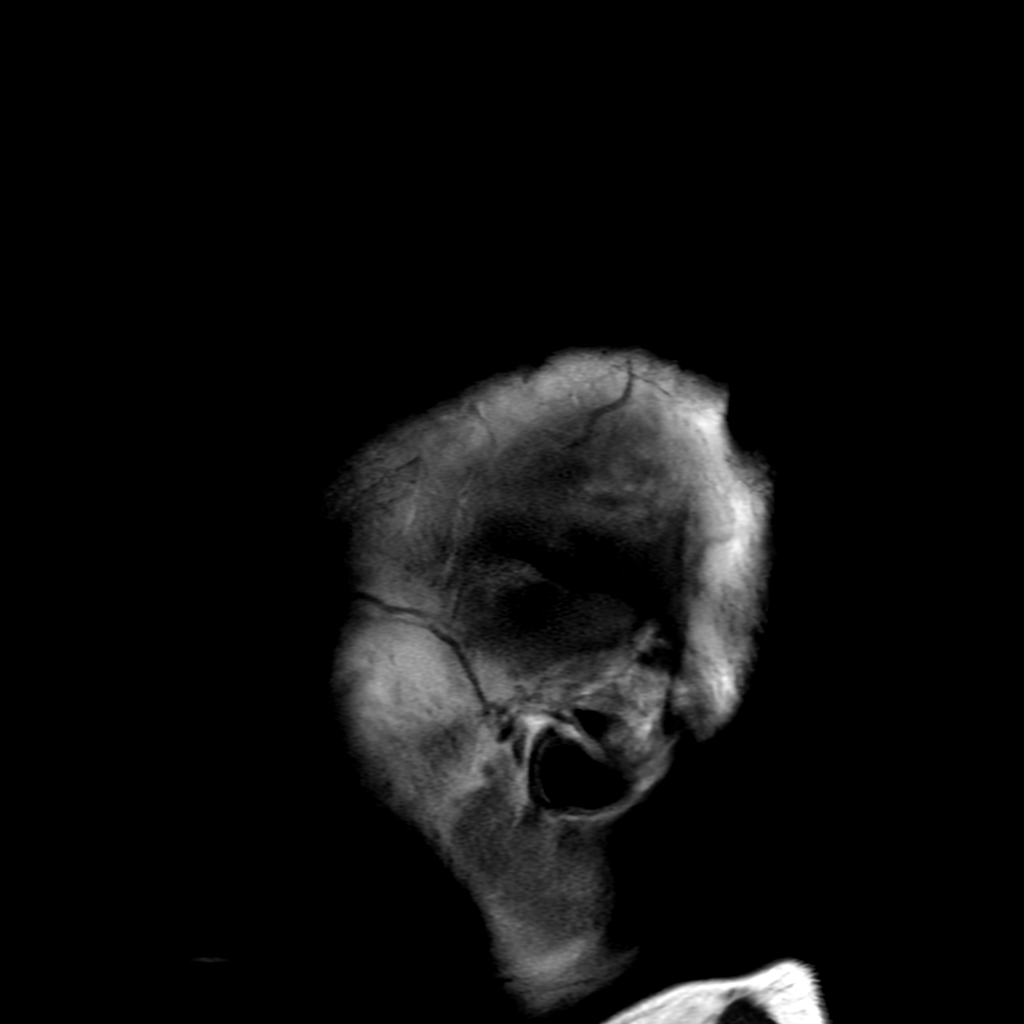
[im 23/23]
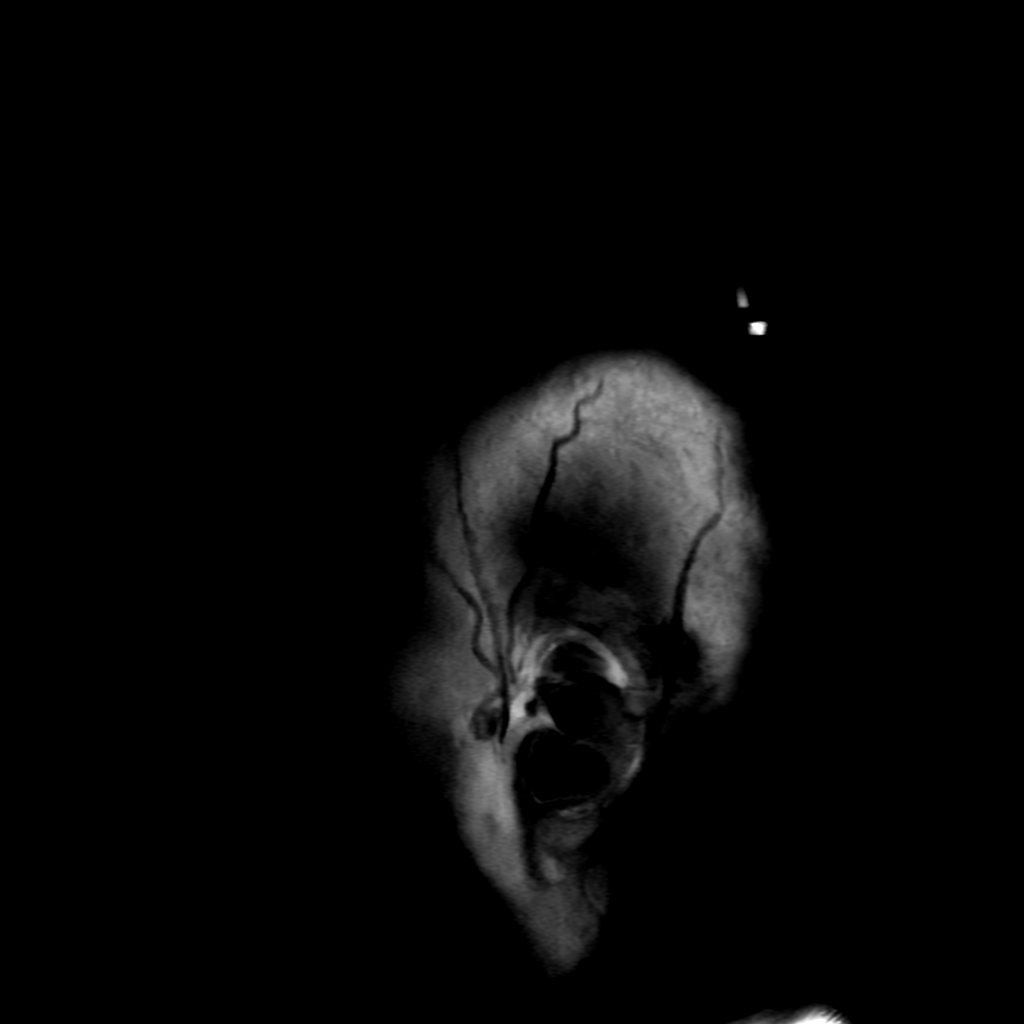

[Series 7: FLAIR · axial · 3.0mm · 0.47mm/px · z∈[-63,+73]mm · 2 of 26 slices shown (2 of 2)]
[im 1/26]
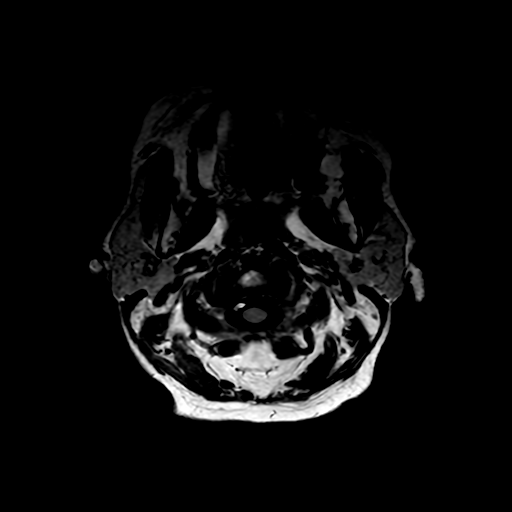
[im 26/26]
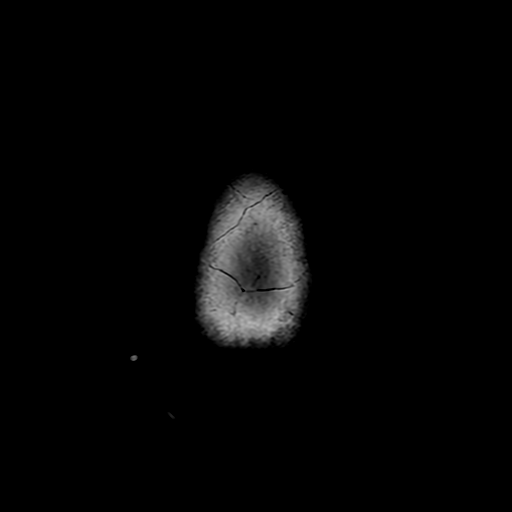

[Series 350: ADC · axial · 3.0mm · 0.94mm/px · z∈[-74,+51]mm · 4 of 47 slices shown (1 of 2)]
[im 1/47]
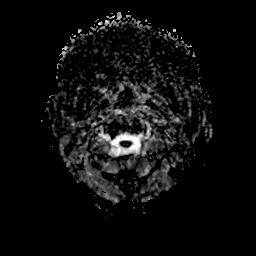
[im 16/47]
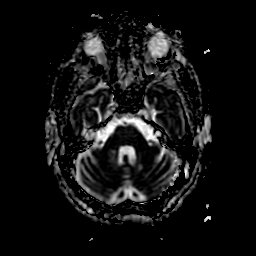
[im 31/47]
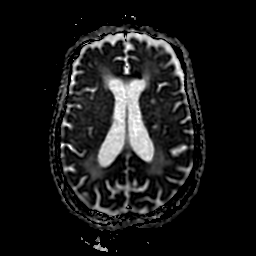
[im 47/47]
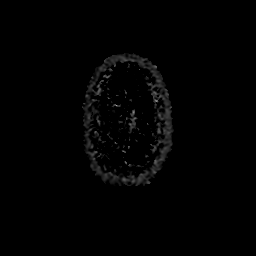

[Series 450: ADC · coronal · 4.0mm · 0.94mm/px · 3 of 37 slices shown (2 of 2)]
[im 1/37]
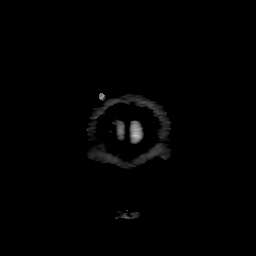
[im 19/37]
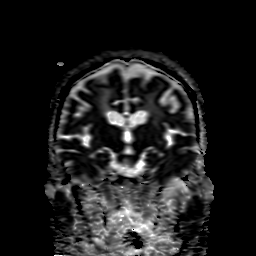
[im 37/37]
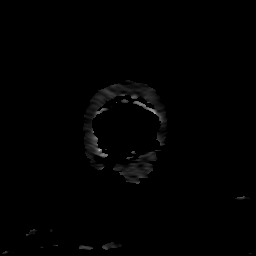

[26 of 48 positions shown; findings below may reference images not displayed]

FINDINGS: Brain: Scattered small foci of restricted diffusion are seen within
the bilateral cerebral hemispheres, consistent with small acute
infarcts, likely embolic.

No hemorrhage, hydrocephalus, extra-axial collection or mass lesion.

Scattered confluent foci of T2 hyperintensity are seen within the
white matter of the cerebral hemispheres, within the thalami, pons
and cerebellum, nonspecific remote lacunar infarcts are seen in the
bilateral basal ganglia, bilateral centrum semiovale and corona
radiata at the, right middle cerebellar peduncle and left cerebellar
hemisphere. Findings are more pronounced than expected for age.

Prominence of the cerebral sulci and ventricular system reflecting
parenchymal volume loss.

Vascular: Normal flow voids. Single A2 from incidentally noted

Skull and upper cervical spine: Normal marrow signal.

Sinuses/Orbits: Mucosal thickening of the maxillary sinuses,
sphenoid sinuses and ethmoid cells. The orbits are maintained.

Other: Bilateral mastoid effusions.
IMPRESSION: 1. Scattered small foci of restricted diffusion within the bilateral
cerebral hemispheres, consistent with small acute infarcts, likely
embolic.
2. Advanced white matter disease, more pronounced than expected for
age.
3. Mild inflammatory paranasal sinus disease and bilateral mastoid
effusions.

These results were called by telephone at the time of interpretation
on [DATE] at [DATE] to provider KAKI , who verbally
acknowledged these results.

## 2019-08-10 MED ORDER — POTASSIUM CHLORIDE 20 MEQ/15ML (10%) PO SOLN
20.0000 meq | ORAL | Status: AC
  Administered 2019-08-10 (×2): 20 meq
  Filled 2019-08-10 (×2): qty 15

## 2019-08-10 MED ORDER — DEXMEDETOMIDINE HCL IN NACL 400 MCG/100ML IV SOLN
0.2000 ug/kg/h | INTRAVENOUS | Status: DC
  Administered 2019-08-10: 0.6 ug/kg/h via INTRAVENOUS
  Administered 2019-08-11: 0.3 ug/kg/h via INTRAVENOUS
  Filled 2019-08-10 (×2): qty 100

## 2019-08-10 MED ORDER — POTASSIUM CHLORIDE 20 MEQ/15ML (10%) PO SOLN
40.0000 meq | Freq: Once | ORAL | Status: AC
  Administered 2019-08-10: 40 meq
  Filled 2019-08-10: qty 30

## 2019-08-10 MED ORDER — LORAZEPAM 2 MG/ML IJ SOLN
1.0000 mg | INTRAMUSCULAR | Status: DC | PRN
  Administered 2019-08-11 (×2): 2 mg via INTRAVENOUS
  Filled 2019-08-10 (×2): qty 1

## 2019-08-10 MED ORDER — DEXTROSE 50 % IV SOLN
INTRAVENOUS | Status: AC
  Administered 2019-08-10: 50 mL
  Filled 2019-08-10: qty 50

## 2019-08-10 MED ORDER — CLONIDINE HCL 0.1 MG PO TABS
0.1000 mg | ORAL_TABLET | Freq: Two times a day (BID) | ORAL | Status: DC
  Administered 2019-08-10 – 2019-08-15 (×12): 0.1 mg
  Filled 2019-08-10 (×12): qty 1

## 2019-08-10 NOTE — Progress Notes (Addendum)
NAME:  Cindy [name], MRN:  [id number], DOB:  [birth year], LOS: 5 ADMISSION DATE:  08/05/2019, CONSULTATION DATE:  08/05/19 REFERRING MD:  Dr. Rogene Houston, CHIEF COMPLAINT:  AMS    Brief History   59 y/o F who presented to [facility] 3/19 with reports of altered mental status. Active seizures on arrival to ER.  Hx of polysubstance abuse.   History of present illness   59 y/o F who presented to [facility] on 3/19 via EMS with reports of AMS.    The patient was actively seizing on arrival to ER.  She was noted to have eye deviation to the right with twitching of the face / unresponsive. There were concerns of vomiting prior to arrival.  She was treated with IV ativan on arrival and keppra.  The patient was intubated for airway protection. UDS was noted to be cocaine positive.  Salicylate level <7, ETOH 21.  CXR with ETT in appropriate position, no infiltrate. Initial labs - Na 124, K 4, Cl 90, CO2 16, glucose 87, BUN 8, Sr Cr 1.08, AG 18, lipase 69, AST 57, ALT 25, BNP 121, troponin 33, WBC 10.9, Hgb 14.9 and platelets 338.    The patient was transferred to [facility] for further evaluation.   Past Medical History  CVA - 2005 HTN HLD  Abnormal LFT's  Polysubstance Abuse   Significant Hospital Events   3/19 Admit with AMS, active seizures on arrival, intubated  3/22 Self-extubated  Consults:  PCCM   Procedures:  ETT 3/19 >> 3/22  Significant Diagnostic Tests:   CT Head 3/19 >> no acute intracranial pathology, extensive periventricular and deep white matter hypodensity markedly increased compared to remote prior CT 2006.    EEG 3/22 Negative for seizures  Micro Data:  UA 3/19 >> glucose >500, 5 ketones, 100 protein, rare bacteria, 0-5 WBC Resp culture 3/19>Few GPC  Antimicrobials:  Ceftriaxone 3/20-3/21 Azithro 3/20-3/21  Interim history/subjective:  No overt seizure activity reported since her self extubation 3/22.  She is requiring Ativan frequently for agitation including tachycardia,  hypertension. I/O+ 5.5 L Urine output 1950 cc in last 24 hours MRI brain has not yet been done Potassium replaced this morning  Objective   Blood pressure (!) 168/95, pulse 100, temperature 97.8 F (36.6 C), temperature source Oral, resp. rate (!) 26, height 5\' 4"  (1.626 m), weight 56 kg, SpO2 98 %.        Intake/Output Summary (Last 24 hours) at 08/10/2019 0926 Last data filed at 08/10/2019 0800 Gross per 24 hour  Intake 1250 ml  Output 1950 ml  Net -700 ml   Filed Weights   08/08/19 0348 08/09/19 0451 08/10/19 0353  Weight: 61.3 kg 60.9 kg 56 kg   Physical Exam: General: Obese ill-appearing woman, moving randomly in the bed HENT: NG tube in place, oropharynx dry, otherwise clear, no upper airway secretions Eyes: No icterus Respiratory: Coarse bilateral breath sounds, no wheezing Cardiovascular: Tachycardic 110, distant, no murmur GI: Obese, nondistended, positive bowel sounds Extremities: No edema Neuro: Opens eyes to voice, tries to speak, partially oriented, moderate cough strength, moves her upper and lower extremities, intermittently follows some commands Skin: Right flank bruising  Resolved Hospital Problem list    AKI  Assessment & Plan:   Acute Metabolic Encephalopathy   Polysubstance abuse Alcohol withdrawal History of CVA per chart review CT head with no acute intracranial abnormalities. EEG negative High Ativan requirement.  Restart Precedex on 3/24 Add clonidine 3/24 Seizure precautions Try to minimize sedation as  able Thiamine, folate, multivitamin Substance abuse counseling when she is able to participate MRI ordered and pending  Acute Respiratory Failure Intubation for airway protection but vomiting prior to arrival. Self-extubated 3/22 and appears to be protecting airway. Pulmonary hygiene Avoid oversedation  Coffee ground emesis - no further episodes Continue daily PPI Follow CBC  Hyponatremia, appears hypovolemic, stable 131 on 3/24 In  setting of ETOH abuse  Follow BMP, volume status  R flank mass/hematoma - improving likely hematoma raising concern for abdominal/chest wall trauma. Chemical DVT prophylaxis restarted  > lovenox   Hypokalemia  Replaced again on 3/24 Follow BMP, magnesium   Best practice:  Diet: NPO, tube feeding via NG tube Pain/Anxiety/Delirium protocol (if indicated): Restart Precedex 3/24 VAP protocol (if indicated):  DVT prophylaxis: SCDs, lovenox GI prophylaxis: PPI BID Glucose control: SSI Mobility: bed level Code Status: Full  Family Communication: called husband 3/24 but no answer, will continue to try to reach him.  Disposition: ICU  Labs   CBC: Recent Labs  Lab 08/05/19 1200 08/05/19 2314 08/06/19 0918 08/06/19 1553 08/07/19 0330 08/07/19 0807 08/08/19 0335 08/09/19 0218 08/10/19 0356  WBC 10.9*   < > 11.7*   < > 10.4 9.2 9.5 8.8 8.3  NEUTROABS 8.2*  --  10.0*  --   --   --   --   --   --   HGB 14.9   < > 12.4   < > 10.9* 11.1* 10.9* 10.6* 12.4  HCT 43.4   < > 35.4*   < > 31.8* 32.8* 32.8* 31.6* 35.4*  MCV 95.6   < > 94.4   < > 96.4 96.5 97.9 96.3 94.1  PLT 338   < > 229   < > 158 188 184 185 237   < > = values in this interval not displayed.    Basic Metabolic Panel: Recent Labs  Lab 08/05/19 1200 08/05/19 2314 08/06/19 0037 08/06/19 0352 08/07/19 0807 08/07/19 1024 08/07/19 1720 08/08/19 0335 08/08/19 0915 08/09/19 0218 08/10/19 0356  NA 124*   < > 129*   < > 129*  --  130*  --  130* 132* 131*  K 4.0   < > 4.1   < > 3.6  --  3.6  --  3.5 3.3* 3.3*  CL 90*  --  97*   < > 103  --  102  --  104 101 97*  CO2 16*  --  19*   < > 20*  --  19*  --  20* 23 24  GLUCOSE 87  --  94   < > 182*  --  122*  --  151* 137* 143*  BUN 8  --  9   < > 8  --  9  --  14 12 7   CREATININE 1.05*  --  1.06*   < > 1.01*  --  0.91  --  1.00 0.94 0.82  CALCIUM 9.2  --  8.1*   < > 8.3*  --  8.5*  --  8.4* 8.4* 8.7*  MG 1.8  --  1.5*  --   --  1.6* 2.1 2.0  --   --   --   PHOS  --   --   2.9  --   --  2.5 2.9 3.7  --   --   --    < > = values in this interval not displayed.   GFR: Estimated Creatinine Clearance: 63 mL/min (by C-G formula based  on SCr of 0.82 mg/dL). Recent Labs  Lab 08/05/19 1402 08/06/19 0037 08/06/19 1020 08/06/19 1310 08/06/19 1553 08/07/19 0807 08/08/19 0335 08/09/19 0218 08/10/19 0356  WBC  --    < >  --   --    < > 9.2 9.5 8.8 8.3  LATICACIDVEN 2.7*  --  1.3 1.5  --   --   --   --   --    < > = values in this interval not displayed.    Liver Function Tests: Recent Labs  Lab 08/05/19 1200 08/06/19 0352 08/09/19 1100  AST 57* 79* 29  ALT 25 33 27  ALKPHOS 93 73 56  BILITOT 1.1 1.6* 0.7  PROT 8.1 6.6 5.6*  ALBUMIN 3.7 2.9* 2.1*   Recent Labs  Lab 08/05/19 1200  LIPASE 69*   Recent Labs  Lab 08/06/19 0035 08/09/19 1100  AMMONIA 30 23    ABG    Component Value Date/Time   PHART 7.533 (H) 08/05/2019 2314   PCO2ART 24.7 (L) 08/05/2019 2314   PO2ART 196.0 (H) 08/05/2019 2314   HCO3 20.8 08/05/2019 2314   TCO2 22 08/05/2019 2314   ACIDBASEDEF 1.0 08/05/2019 2314   O2SAT 100.0 08/05/2019 2314     Coagulation Profile: Recent Labs  Lab 08/05/19 1200 08/09/19 1100  INR 1.0 1.1    Cardiac Enzymes: No results for input(s): CKTOTAL, CKMB, CKMBINDEX, TROPONINI in the last 168 hours.  HbA1C: Hgb A1c MFr Bld  Date/Time Value Ref Range Status  03/26/2015 09:21 AM 5.3 4.8 - 5.6 % Final    Comment:             Pre-diabetes: 5.7 - 6.4          Diabetes: >6.4          Glycemic control for adults with diabetes: <7.0     CBG: Recent Labs  Lab 08/09/19 1526 08/09/19 1918 08/09/19 2329 08/10/19 0312 08/10/19 0716  GLUCAP 159* 121* 143* 133* 137*    Critical care time: 32 min     Levy Pupa, MD, PhD 08/10/2019, 9:37 AM [facility] [phone] or if no answer [phone]

## 2019-08-10 NOTE — Progress Notes (Signed)
eLink Physician-Brief Progress Note Patient Name: Cindy [name] DOB: [birth year] MRN: [id number]   Date of Service  08/10/2019  HPI/Events of Note  K+ 3.3  eICU Interventions  Elink electrolyte replacement protocol for K+ of 3.3 ordered.        Thomasene Lot Cindy [name] 08/10/2019, 6:32 AM

## 2019-08-10 NOTE — Progress Notes (Signed)
SLP Cancellation Note  Patient Details Name: Cindy [name] MRN: [id number] DOB: 06-16-1960   Cancelled treatment:        Per RN pt is experiencing significant withdrawals, starting Precedex. RN placing IV presently. Not appropriate for dysphagia treatment/po trials- has Cortrak. Will continue efforts to move pt toward oral nutrition when pt able.    Malaina, Mortellaro 08/10/2019, 10:13 AM  Breck Coons Lonell Face.Ed Nurse, children's [phone] Office [phone]

## 2019-08-11 ENCOUNTER — Inpatient Hospital Stay (HOSPITAL_COMMUNITY): Payer: Self-pay

## 2019-08-11 DIAGNOSIS — I639 Cerebral infarction, unspecified: Secondary | ICD-10-CM

## 2019-08-11 LAB — CBC
HCT: 33.5 % — ABNORMAL LOW (ref 36.0–46.0)
Hemoglobin: 11.4 g/dL — ABNORMAL LOW (ref 12.0–15.0)
MCH: 32.5 pg (ref 26.0–34.0)
MCHC: 34 g/dL (ref 30.0–36.0)
MCV: 95.4 fL (ref 80.0–100.0)
Platelets: 229 10*3/uL (ref 150–400)
RBC: 3.51 MIL/uL — ABNORMAL LOW (ref 3.87–5.11)
RDW: 12.1 % (ref 11.5–15.5)
WBC: 6 10*3/uL (ref 4.0–10.5)
nRBC: 0 % (ref 0.0–0.2)

## 2019-08-11 LAB — GLUCOSE, CAPILLARY
Glucose-Capillary: 121 mg/dL — ABNORMAL HIGH (ref 70–99)
Glucose-Capillary: 138 mg/dL — ABNORMAL HIGH (ref 70–99)
Glucose-Capillary: 126 mg/dL — ABNORMAL HIGH (ref 70–99)
Glucose-Capillary: 121 mg/dL — ABNORMAL HIGH (ref 70–99)
Glucose-Capillary: 134 mg/dL — ABNORMAL HIGH (ref 70–99)
Glucose-Capillary: 137 mg/dL — ABNORMAL HIGH (ref 70–99)

## 2019-08-11 LAB — CULTURE, BLOOD (ROUTINE X 2)
Culture: NO GROWTH
Culture: NO GROWTH

## 2019-08-11 LAB — BASIC METABOLIC PANEL
Anion gap: 7 (ref 5–15)
BUN: 16 mg/dL (ref 6–20)
CO2: 25 mmol/L (ref 22–32)
Calcium: 8.6 mg/dL — ABNORMAL LOW (ref 8.9–10.3)
Chloride: 100 mmol/L (ref 98–111)
Creatinine, Ser: 0.74 mg/dL (ref 0.44–1.00)
GFR calc Af Amer: >60 mL/min (ref >60)
GFR calc non Af Amer: >60 mL/min (ref >60)
Glucose, Bld: 135 mg/dL — ABNORMAL HIGH (ref 70–99)
Potassium: 4.8 mmol/L (ref 3.5–5.1)
Sodium: 132 mmol/L — ABNORMAL LOW (ref 135–145)

## 2019-08-11 LAB — LIPID PANEL
Cholesterol: 102 mg/dL (ref 0–200)
HDL: 50 mg/dL (ref >40)
LDL Cholesterol: 46 mg/dL (ref 0–99)
Total CHOL/HDL Ratio: 2 RATIO
Triglycerides: 31 mg/dL (ref <150)
VLDL: 6 mg/dL (ref 0–40)

## 2019-08-11 LAB — HEMOGLOBIN A1C
Hgb A1c MFr Bld: 5.1 % (ref 4.8–5.6)
Mean Plasma Glucose: 99.67 mg/dL

## 2019-08-11 MED ORDER — ASPIRIN 325 MG PO TABS
325.0000 mg | ORAL_TABLET | Freq: Every day | ORAL | Status: DC
  Administered 2019-08-11 – 2019-08-15 (×5): 325 mg
  Filled 2019-08-11 (×5): qty 1

## 2019-08-11 MED ORDER — AMLODIPINE BESYLATE 10 MG PO TABS
10.0000 mg | ORAL_TABLET | ORAL | Status: DC
  Administered 2019-08-11 – 2019-08-15 (×5): 10 mg
  Filled 2019-08-11 (×5): qty 1

## 2019-08-11 NOTE — Progress Notes (Signed)
Carotid duplex  has been completed. Refer to [facility] under chart review to view preliminary results.   08/11/2019  2:51 PM Bushra Denman, Gerarda Gunther

## 2019-08-11 NOTE — Progress Notes (Signed)
NAME:  Cindy [name], MRN:  [id number], DOB:  1960-09-08, LOS: 6 ADMISSION DATE:  08/05/2019, CONSULTATION DATE:  08/05/19 REFERRING MD:  Dr. Deretha Emory, CHIEF COMPLAINT:  AMS    Brief History   59 y/o F who presented to [facility] 3/19 with reports of altered mental status. Active seizures on arrival to ER.  Hx of polysubstance abuse.   History of present illness   59 y/o F who presented to [facility] on 3/19 via EMS with reports of AMS.    The patient was actively seizing on arrival to ER.  She was noted to have eye deviation to the right with twitching of the face / unresponsive. There were concerns of vomiting prior to arrival.  She was treated with IV ativan on arrival and keppra.  The patient was intubated for airway protection. UDS was noted to be cocaine positive.  Salicylate level <7, ETOH 21.  CXR with ETT in appropriate position, no infiltrate. Initial labs - Na 124, K 4, Cl 90, CO2 16, glucose 87, BUN 8, Sr Cr 1.08, AG 18, lipase 69, AST 57, ALT 25, BNP 121, troponin 33, WBC 10.9, Hgb 14.9 and platelets 338.    The patient was transferred to [facility] for further evaluation.   Past Medical History  CVA - 2005 HTN HLD  Abnormal LFT's  Polysubstance Abuse   Significant Hospital Events   3/19 Admit with AMS, active seizures on arrival, intubated  3/22 Self-extubated  Consults:  PCCM   Procedures:  ETT 3/19 >> 3/22  Significant Diagnostic Tests:   CT Head 3/19 >> no acute intracranial pathology, extensive periventricular and deep white matter hypodensity markedly increased compared to remote prior CT 2006.    EEG 3/22 Negative for seizures  MRI brain 3/24 >> scattered small bilateral cerebral foci suggestive of small acute infarcts  Micro Data:  UA 3/19 >> glucose >500, 5 ketones, 100 protein, rare bacteria, 0-5 WBC Resp culture 3/19>Few GPC  Antimicrobials:  Ceftriaxone 3/20-3/21 Azithro 3/20-3/21  Interim history/subjective:  Present 0.3 this morning, no more Ativan since the  Precedex was added Potassium and magnesium replaced this morning  Objective   Blood pressure 123/85, pulse 77, temperature (!) 97.5 F (36.4 C), temperature source Axillary, resp. rate (!) 25, height 5\' 4"  (1.626 m), weight 58.3 kg, SpO2 99 %.        Intake/Output Summary (Last 24 hours) at 08/11/2019 1013 Last data filed at 08/11/2019 0800 Gross per 24 hour  Intake 1302.58 ml  Output 520 ml  Net 782.58 ml   Filed Weights   08/09/19 0451 08/10/19 0353 08/11/19 0307  Weight: 60.9 kg 56 kg 58.3 kg   Physical Exam: General: Ill-appearing woman, no distress HENT: NG tube in place, otherwise clear, no upper airway secretions Eyes: No icterus Respiratory: Coarse bilateral breath sounds, no wheeze Cardiovascular: Regular, distant, no murmur GI: Obese, nondistended, positive bowel sounds Extremities: No significant edema Neuro: Opens eyes to voice, able to phonate but weak, follow commands, moderate cough strength Skin: Right flank bruising  Resolved Hospital Problem list    AKI  Assessment & Plan:   Acute Metabolic Encephalopathy   Polysubstance abuse Alcohol withdrawal History of prior CVA per chart review 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  Add aspirin 3/25.  Repeat blood cultures.  Her echocardiogram did not show any evidence of valvular disease, was inconsistent with an atrial shunt (bubble study not done).  Would push for TEE if repeat blood cultures positive Wean Precedex  as able, minimize any benzodiazepines Continue clonidine as ordered Seizure precautions and withdrawal precautions Thiamine, folate, multivitamin Substance abuse counseling when she is able to participate  Acute Respiratory Failure with hypoxia Intubation for airway protection but vomiting prior to arrival. Self-extubated 3/22 and appears to be protecting airway. Continue pulmonary hygiene Very careful with sedating medications, avoid oversedation  Coffee ground emesis - no  further episodes Continue daily PPI and follow CBC  Hyponatremia, appears hypovolemic, stable 131 on 3/24 In setting of ETOH abuse  Follow BMP and volume status  R flank mass/hematoma - improving likely hematoma raising concern for abdominal/chest wall trauma. Tolerating enoxaparin  Hypokalemia  Replaced magnesium and potassium again on 3/25 Follow BMP   Best practice:  Diet: NPO, tube feeding via NG tube Pain/Anxiety/Delirium protocol (if indicated): Restart Precedex 3/24 VAP protocol (if indicated):  DVT prophylaxis: SCDs, lovenox GI prophylaxis: PPI BID Glucose control: SSI Mobility: bed level Code Status: Full  Family Communication: spoke with husband at bedside 3/24 and 3/25 Disposition: ICU  Labs   CBC: Recent Labs  Lab 08/05/19 1200 08/05/19 2314 08/06/19 0918 08/06/19 1553 08/07/19 0807 08/08/19 0335 08/09/19 0218 08/10/19 0356 08/11/19 0301  WBC 10.9*   < > 11.7*   < > 9.2 9.5 8.8 8.3 6.0  NEUTROABS 8.2*  --  10.0*  --   --   --   --   --   --   HGB 14.9   < > 12.4   < > 11.1* 10.9* 10.6* 12.4 11.4*  HCT 43.4   < > 35.4*   < > 32.8* 32.8* 31.6* 35.4* 33.5*  MCV 95.6   < > 94.4   < > 96.5 97.9 96.3 94.1 95.4  PLT 338   < > 229   < > 188 184 185 237 229   < > = values in this interval not displayed.    Basic Metabolic Panel: Recent Labs  Lab 08/05/19 1200 08/05/19 2314 08/06/19 0037 08/06/19 0352 08/07/19 0807 08/07/19 1024 08/07/19 1720 08/08/19 0335 08/08/19 0915 08/09/19 0218 08/10/19 0356 08/11/19 0301  NA 124*   < > 129*   < >   < >  --  130*  --  130* 132* 131* 132*  K 4.0   < > 4.1   < >   < >  --  3.6  --  3.5 3.3* 3.3* 4.8  CL 90*  --  97*   < >   < >  --  102  --  104 101 97* 100  CO2 16*  --  19*   < >   < >  --  19*  --  20* 23 24 25   GLUCOSE 87  --  94   < >   < >  --  122*  --  151* 137* 143* 135*  BUN 8  --  9   < >   < >  --  9  --  14 12 7 16   CREATININE 1.05*  --  1.06*   < >   < >  --  0.91  --  1.00 0.94 0.82 0.74    CALCIUM 9.2  --  8.1*   < >   < >  --  8.5*  --  8.4* 8.4* 8.7* 8.6*  MG 1.8  --  1.5*  --   --  1.6* 2.1 2.0  --   --   --   --   PHOS  --   --  2.9  --   --  2.5 2.9 3.7  --   --   --   --    < > = values in this interval not displayed.   GFR: Estimated Creatinine Clearance: 64.6 mL/min (by C-G formula based on SCr of 0.74 mg/dL). Recent Labs  Lab 08/05/19 1402 08/06/19 0037 08/06/19 1020 08/06/19 1310 08/06/19 1553 08/08/19 0335 08/09/19 0218 08/10/19 0356 08/11/19 0301  WBC  --    < >  --   --    < > 9.5 8.8 8.3 6.0  LATICACIDVEN 2.7*  --  1.3 1.5  --   --   --   --   --    < > = values in this interval not displayed.    Liver Function Tests: Recent Labs  Lab 08/05/19 1200 08/06/19 0352 08/09/19 1100  AST 57* 79* 29  ALT 25 33 27  ALKPHOS 93 73 56  BILITOT 1.1 1.6* 0.7  PROT 8.1 6.6 5.6*  ALBUMIN 3.7 2.9* 2.1*   Recent Labs  Lab 08/05/19 1200  LIPASE 69*   Recent Labs  Lab 08/06/19 0035 08/09/19 1100  AMMONIA 30 23    ABG    Component Value Date/Time   PHART 7.533 (H) 08/05/2019 2314   PCO2ART 24.7 (L) 08/05/2019 2314   PO2ART 196.0 (H) 08/05/2019 2314   HCO3 20.8 08/05/2019 2314   TCO2 22 08/05/2019 2314   ACIDBASEDEF 1.0 08/05/2019 2314   O2SAT 100.0 08/05/2019 2314     Coagulation Profile: Recent Labs  Lab 08/05/19 1200 08/09/19 1100  INR 1.0 1.1    Cardiac Enzymes: No results for input(s): CKTOTAL, CKMB, CKMBINDEX, TROPONINI in the last 168 hours.  HbA1C: Hgb A1c MFr Bld  Date/Time Value Ref Range Status  08/11/2019 03:01 AM 5.1 4.8 - 5.6 % Final    Comment:    (NOTE) Pre diabetes:          5.7%-6.4% Diabetes:              >6.4% Glycemic control for   <7.0% adults with diabetes   03/26/2015 09:21 AM 5.3 4.8 - 5.6 % Final    Comment:             Pre-diabetes: 5.7 - 6.4          Diabetes: >6.4          Glycemic control for adults with diabetes: <7.0     CBG: Recent Labs  Lab 08/10/19 1557 08/10/19 1910  08/10/19 2310 08/11/19 0315 08/11/19 0726  GLUCAP 155* 157* 125* 121* 138*    Critical care time: 31 min     Levy Pupa, MD, PhD 08/11/2019, 10:13 AM [facility] [phone] or if no answer [phone]

## 2019-08-11 NOTE — Progress Notes (Signed)
  Speech Language Pathology Treatment: Dysphagia  Patient Details Name: Cindy [name] MRN: [id number] DOB: 02/04/1961 Today's Date: 08/11/2019 Time: 4580-9983 SLP Time Calculation (min) (ACUTE ONLY): 20 min  Assessment / Plan / Recommendation Clinical Impression  Skilled intervention for dysphagia treatment and swallow status. RN cut her Precedex off this morning and pt easily aroused adequately for po trials and calm, following command. Verbally responsive to questions in a clear but lower volume and cough on command is strong. Clinical observation revealed concerns for inadequate airway closure and pharyngeal residue that appears to be acute and reversible as her alertness, awareness improves. She was intubated for 3-4 days and has been 3 days post extubation. After initial swallow with most consistencies she performed 3-4 sub swallow typically concerning for po residue in pharynx. She coughed after 1/3 trials puree and immediately after half teaspoon sips thin and continued to clear throat. Discussed results with RN and therapist recommending continue NPO but allow ice chips after oral care and full supervision to increase use of oropharyngeal swallow musculature. Will continue to treat and recommend po's or instrumental study if needed when safe and appropriate.   HPI HPI: 59 y/o F who presented to [facility] 3/19 with reports of altered mental status. Active seizures on arrival to ER.  Hx of polysubstance abuse. Intubated from 3/19-3/22.       SLP Plan  Continue with current plan of care       Recommendations  Diet recommendations: Other(comment)(NPO except ice chips) Medication Administration: Via alternative means                Oral Care Recommendations: Oral care prior to ice chip/H20 Follow up Recommendations: Skilled Nursing facility;Inpatient Rehab SLP Visit Diagnosis: Dysphagia, unspecified (R13.10) Plan: Continue with current plan of care       GO                 Laiza, Veenstra 08/11/2019, 9:41 AM  Breck Coons Lonell Face.Ed Nurse, children's [phone] Office [phone]

## 2019-08-11 NOTE — Progress Notes (Signed)
PT Cancellation Note  Patient Details Name: Cindy [name] MRN: [id number] DOB: March 02, 1961   Cancelled Treatment:    Reason Eval/Treat Not Completed: (P) Patient at procedure or test/unavailable Echo in room. PT will follow back for treatment tomorrow.   Akhil Piscopo B. Beverely Risen PT, DPT Acute Rehabilitation Services Pager [phone] Office [phone]    Cindy [name] 08/11/2019, 3:17 PM

## 2019-08-12 ENCOUNTER — Inpatient Hospital Stay (HOSPITAL_COMMUNITY): Payer: Self-pay

## 2019-08-12 LAB — GLUCOSE, CAPILLARY
Glucose-Capillary: 140 mg/dL — ABNORMAL HIGH (ref 70–99)
Glucose-Capillary: 109 mg/dL — ABNORMAL HIGH (ref 70–99)
Glucose-Capillary: 139 mg/dL — ABNORMAL HIGH (ref 70–99)
Glucose-Capillary: 125 mg/dL — ABNORMAL HIGH (ref 70–99)
Glucose-Capillary: 137 mg/dL — ABNORMAL HIGH (ref 70–99)
Glucose-Capillary: 127 mg/dL — ABNORMAL HIGH (ref 70–99)

## 2019-08-12 LAB — BASIC METABOLIC PANEL
Anion gap: 12 (ref 5–15)
BUN: 17 mg/dL (ref 6–20)
CO2: 21 mmol/L — ABNORMAL LOW (ref 22–32)
Calcium: 8.7 mg/dL — ABNORMAL LOW (ref 8.9–10.3)
Chloride: 98 mmol/L (ref 98–111)
Creatinine, Ser: 0.84 mg/dL (ref 0.44–1.00)
GFR calc Af Amer: >60 mL/min (ref >60)
GFR calc non Af Amer: >60 mL/min (ref >60)
Glucose, Bld: 136 mg/dL — ABNORMAL HIGH (ref 70–99)
Potassium: 4.6 mmol/L (ref 3.5–5.1)
Sodium: 131 mmol/L — ABNORMAL LOW (ref 135–145)

## 2019-08-12 LAB — CBC
HCT: 34.5 % — ABNORMAL LOW (ref 36.0–46.0)
Hemoglobin: 11.7 g/dL — ABNORMAL LOW (ref 12.0–15.0)
MCH: 32.6 pg (ref 26.0–34.0)
MCHC: 33.9 g/dL (ref 30.0–36.0)
MCV: 96.1 fL (ref 80.0–100.0)
Platelets: 277 10*3/uL (ref 150–400)
RBC: 3.59 MIL/uL — ABNORMAL LOW (ref 3.87–5.11)
RDW: 12.2 % (ref 11.5–15.5)
WBC: 7.4 10*3/uL (ref 4.0–10.5)
nRBC: 0 % (ref 0.0–0.2)

## 2019-08-12 MED ORDER — CHLORHEXIDINE GLUCONATE 0.12 % MT SOLN
15.0000 mL | Freq: Two times a day (BID) | OROMUCOSAL | Status: DC
  Administered 2019-08-12 – 2019-08-19 (×12): 15 mL via OROMUCOSAL
  Filled 2019-08-12 (×13): qty 15

## 2019-08-12 MED ORDER — LORAZEPAM 2 MG/ML IJ SOLN
1.0000 mg | Freq: Four times a day (QID) | INTRAMUSCULAR | Status: DC | PRN
  Administered 2019-08-12: 1 mg via INTRAVENOUS
  Administered 2019-08-13 (×2): 2 mg via INTRAVENOUS
  Filled 2019-08-12 (×3): qty 1

## 2019-08-12 MED ORDER — RESOURCE THICKENUP CLEAR PO POWD
ORAL | Status: DC | PRN
  Filled 2019-08-12: qty 125

## 2019-08-12 MED ORDER — ORAL CARE MOUTH RINSE
15.0000 mL | Freq: Two times a day (BID) | OROMUCOSAL | Status: DC
  Administered 2019-08-12 – 2019-08-19 (×13): 15 mL via OROMUCOSAL

## 2019-08-12 NOTE — Progress Notes (Addendum)
Modified Barium Swallow Progress Note  Patient Details  Name: Cindy [name] MRN: [id number] Date of Birth: [birth year]  Today's Date: 08/12/2019  Modified Barium Swallow completed.  Full report located under Chart Review in the Imaging Section.  Brief recommendations include the following:  Clinical Impression  MBS completed with pt exhibitng mild oropharyngeal deficits leading to penetration with thin liquids due to incomplete laryngeal closure. Questionable trace amount of barium reaching vocal cords versus higher in vestibule (very faint). Decreased cohesion with lingual residue in right buccal cavity. Swallow was slower to trigger with barium reaching valleculae and once pyriform sinsues before laryngeal protection completed. Recommend initiation of Dys 1, nectar thick liquids, straws allowed, aks pt to clear throat intermittently, full assist for feeding and swallow strategies. ST will continue to treat. ST can observe and upgrade to thin at bedside without instrumental assessment.       Swallow Evaluation Recommendations       SLP Diet Recommendations: Dysphagia 1 (Puree) solids;Nectar thick liquid   Liquid Administration via: Cup;No straw;Straw   Medication Administration: Crushed with puree   Supervision: Full assist for feeding;Staff to assist with self feeding   Compensations: Minimize environmental distractions;Slow rate;Small sips/bites;Clear throat intermittently   Postural Changes: Seated upright at 90 degrees   Oral Care Recommendations: Oral care BID        Amel, Kitch 08/12/2019,3:23 PM   Breck Coons Lonell Face.Ed Nurse, children's [phone] Office [phone]

## 2019-08-12 NOTE — Progress Notes (Signed)
Occupational Therapy Treatment Patient Details Name: Cindy [name] MRN: [id number] DOB: November 05, 1960 Today's Date: 08/12/2019    History of present illness 59 y/o F who presented to [facility] 3/19 with reports of altered mental status. Active seizures on arrival to ER.  Hx of polysubstance abuse. Intubated from 3/19-3/22.  Other PMH includes CVA and HTN.    OT comments  Pt more alert, following commands with increased time and multimodal cues, oriented only to self. Assisted to EOB with +2 assist and stood from EOB briefly. Pt with flexed posture forward head in sitting and standing. Session limited by pt going to MBS.  Follow Up Recommendations  CIR;Supervision/Assistance - 24 hour    Equipment Recommendations  Other (comment)(defer to next venue)    Recommendations for Other Services      Precautions / Restrictions Precautions Precautions: Fall Restrictions Weight Bearing Restrictions: No       Mobility Bed Mobility Overal bed mobility: Needs Assistance Bed Mobility: Supine to Sit;Sit to Supine     Supine to sit: Mod assist;HOB elevated;+2 for physical assistance Sit to supine: Mod assist;+2 for physical assistance;HOB elevated   General bed mobility comments: Mod assist to come to sitting EOB assisting most at trunk and hips.  Mod assist to help pt position back to supine assist at bil legs and trunk.   Transfers Overall transfer level: Needs assistance Equipment used: 2 person hand held assist Transfers: Sit to/from Stand Sit to Stand: +2 safety/equipment;Mod assist         General transfer comment: Pt with blocked bil feet, posterior hips and extended knees.  She had difficutly getting her feet under her COG.  Pt when attempting to step back bil knees buckled in stance    Balance Overall balance assessment: Needs assistance Sitting-balance support: Feet supported;Bilateral upper extremity supported;No upper extremity supported Sitting balance-Leahy Scale: Poor Sitting  balance - Comments: at times min assist especially when attempting to lift arms, legs or cervical spine, very rounded, stiff trunk.  Postural control: Posterior lean Standing balance support: Bilateral upper extremity supported Standing balance-Leahy Scale: Poor Standing balance comment: Two person mod                           ADL either performed or assessed with clinical judgement   ADL                                         General ADL Comments: continues to need total to max assist     Vision       Perception     Praxis      Cognition Arousal/Alertness: Awake/alert Behavior During Therapy: WFL for tasks assessed/performed Overall Cognitive Status: Impaired/Different from baseline Area of Impairment: Orientation;Safety/judgement;Following commands;Awareness;Problem solving;Memory;Attention                 Orientation Level: Disoriented to;Time;Situation;Place Current Attention Level: Sustained Memory: Decreased short-term memory Following Commands: Follows one step commands consistently Safety/Judgement: Decreased awareness of safety;Decreased awareness of deficits Awareness: Intellectual Problem Solving: Difficulty sequencing;Slow processing;Requires verbal cues;Requires tactile cues General Comments: pt pulling at lines despite repeated cues.         Exercises     Shoulder Instructions       General Comments      Pertinent Vitals/ Pain       Pain Assessment: Faces Faces Pain  Scale: No hurt  Home Living                                          Prior Functioning/Environment              Frequency  Min 2X/week        Progress Toward Goals  OT Goals(current goals can now be found in the care plan section)  Progress towards OT goals: Progressing toward goals  Acute Rehab OT Goals Patient Stated Goal: to go home OT Goal Formulation: With patient Time For Goal Achievement:  08/23/19 Potential to Achieve Goals: Good  Plan Discharge plan remains appropriate    Co-evaluation    PT/OT/SLP Co-Evaluation/Treatment: Yes Reason for Co-Treatment: Complexity of the patient's impairments (multi-system involvement) PT goals addressed during session: Mobility/safety with mobility;Balance;Strengthening/ROM OT goals addressed during session: Strengthening/ROM      AM-PAC OT "6 Clicks" Daily Activity     Outcome Measure   Help from another person eating meals?: Total Help from another person taking care of personal grooming?: Total Help from another person toileting, which includes using toliet, bedpan, or urinal?: Total Help from another person bathing (including washing, rinsing, drying)?: Total Help from another person to put on and taking off regular upper body clothing?: Total Help from another person to put on and taking off regular lower body clothing?: Total 6 Click Score: 6    End of Session    OT Visit Diagnosis: Muscle weakness (generalized) (M62.81);Other abnormalities of gait and mobility (R26.89);Other symptoms and signs involving cognitive function   Activity Tolerance Patient tolerated treatment well   Patient Left in bed;with call bell/phone within reach;with restraints reapplied;with nursing/sitter in room   Nurse Communication          Time: 1244-1300 OT Time Calculation (min): 16 min  Charges: OT General Charges $OT Visit: 1 Visit  Martie Round, OTR/L Acute Rehabilitation Services Pager: [phone] Office: [phone]   Evern Bio 08/12/2019, 2:16 PM

## 2019-08-12 NOTE — Progress Notes (Signed)
Rehab Admissions Coordinator Note:  Per PT and OT recommendation, this patient was screened by Cheri Rous for appropriateness for an Inpatient Acute Rehab Consult.  At this time, we are recommending an Inpatient Rehab consult. Please have attending MD place consult order if in agreement.   Cheri Rous 08/12/2019, 5:15 PM  I can be reached at [phone].

## 2019-08-12 NOTE — Progress Notes (Signed)
NAME:  Cindy [name], MRN:  [id number], DOB:  06/12/1960, LOS: 7 ADMISSION DATE:  08/05/2019, CONSULTATION DATE:  08/05/19 REFERRING MD:  Dr. Deretha Emory, CHIEF COMPLAINT:  AMS    Brief History   59 y/o F who presented to [facility] 3/19 with reports of altered mental status. Active seizures on arrival to ER.  Hx of polysubstance abuse.   History of present illness   59 y/o F who presented to [facility] on 3/19 via EMS with reports of AMS.    The patient was actively seizing on arrival to ER.  She was noted to have eye deviation to the right with twitching of the face / unresponsive. There were concerns of vomiting prior to arrival.  She was treated with IV ativan on arrival and keppra.  The patient was intubated for airway protection. UDS was noted to be cocaine positive.  Salicylate level <7, ETOH 21.  CXR with ETT in appropriate position, no infiltrate. Initial labs - Na 124, K 4, Cl 90, CO2 16, glucose 87, BUN 8, Sr Cr 1.08, AG 18, lipase 69, AST 57, ALT 25, BNP 121, troponin 33, WBC 10.9, Hgb 14.9 and platelets 338.    The patient was transferred to [facility] for further evaluation.   Past Medical History  CVA - 2005 HTN HLD  Abnormal LFT's  Polysubstance Abuse   Significant Hospital Events   3/19 Admit with AMS, active seizures on arrival, intubated  3/22 Self-extubated  Consults:  PCCM   Procedures:  ETT 3/19 >> 3/22  Significant Diagnostic Tests:   CT Head 3/19 >> no acute intracranial pathology, extensive periventricular and deep white matter hypodensity markedly increased compared to remote prior CT 2006.    EEG 3/22 Negative for seizures  MRI brain 3/24 >> scattered small bilateral cerebral foci suggestive of small acute infarcts  Carotid Doppler ultrasound 3/25 >> bilateral ICA stenotic changes 1-39%, vertebrals with antegrade flow, normal subclavians  Lower extremity Doppler 3/25 >> no evidence DVT  Micro Data:  UA 3/19 >> glucose >500, 5 ketones, 100 protein, rare bacteria, 0-5  WBC Resp culture 3/19>Few GPC Blood 3/20 >> negative Blood 3/25 >>   Antimicrobials:  Ceftriaxone 3/20-3/21 Azithro 3/20-3/21  Interim history/subjective:  More awake.  Apparently went back on Precedex for part of the night, weaned to off this morning  Objective   Blood pressure (!) 156/110, pulse (!) 103, temperature 97.7 F (36.5 C), resp. rate (!) 27, height 5\' 4"  (1.626 m), weight 58.2 kg, SpO2 98 %.        Intake/Output Summary (Last 24 hours) at 08/12/2019 0928 Last data filed at 08/12/2019 0900 Gross per 24 hour  Intake 1107.09 ml  Output 700 ml  Net 407.09 ml   Filed Weights   08/10/19 0353 08/11/19 0307 08/12/19 0500  Weight: 56 kg 58.3 kg 58.2 kg   Physical Exam: General: Chronically ill-appearing woman HENT: NG tube still in place, no upper airway secretions, no stridor Respiratory: Clear bilaterally.  Strong cough Cardiovascular: Regular, distant, no murmur GI: Nondistended, positive bowel sounds Extremities: No edema Neuro: Stronger today, interacting, able to carry on a conversation.  Poorly oriented.  Some squirming in the bed but redirectable.  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  Aspirin added 3/25.  Repeated blood cultures on 3/25 given the suspected embolic nature of the strokes.  TTE was negative, did not show evidence for vegetation or intra-atrial shunt.  Carotid Dopplers reassuring.  She will need some rehabilitation, PT but no other clear work-up indicated Precedex to off Continue clonidine as ordered Seizure precautions Appreciate SLP evaluation, planning for MBS on 3/26.  Hopefully will be able to  initiate a diet Completed thiamine and folate.  Continue multivitamin Substance abuse counseling when/if she is willing to participate Push PT OT  Acute Respiratory Failure with hypoxia Intubation for airway protection but vomiting prior to arrival. Self-extubated 3/22 and appears to be protecting airway. Pulmonary hygiene Careful with sedating medications, avoid oversedation  Coffee ground emesis at presentation- no further episodes No intervention Continue PPI, follow CBC  Hyponatremia, appears hypovolemic, stable 131 on 3/26 In setting of ETOH abuse  Follow BMP, volume status  R flank mass/hematoma - improving likely hematoma raising concern for abdominal/chest wall trauma. Tolerating enoxaparin, no intervention, follow intermittent CBC    Best practice:  Diet: NPO, tube feeding via NG tube Pain/Anxiety/Delirium protocol (if indicated): None VAP protocol (if indicated):  DVT prophylaxis: SCDs, lovenox GI prophylaxis: PPI BID Glucose control: SSI Mobility: bed level Code Status: Full  Family Communication: spoke with husband at bedside 3/24 and 3/25 Disposition: Plan transfer to floor on 3/26  Labs   CBC: Recent Labs  Lab 08/05/19 1200 08/05/19 2314 08/06/19 0918 08/06/19 1553 08/08/19 0335 08/09/19 0218 08/10/19 0356 08/11/19 0301 08/12/19 0240  WBC 10.9*   < > 11.7*   < > 9.5 8.8 8.3 6.0 7.4  NEUTROABS 8.2*  --  10.0*  --   --   --   --   --   --   HGB 14.9   < > 12.4   < > 10.9* 10.6* 12.4 11.4* 11.7*  HCT 43.4   < > 35.4*   < > 32.8* 31.6* 35.4* 33.5* 34.5*  MCV 95.6   < > 94.4   < > 97.9 96.3 94.1 95.4 96.1  PLT 338   < > 229   < > 184 185 237 229 277   < > = values in this interval not displayed.    Basic Metabolic Panel: Recent Labs  Lab 08/05/19 1200 08/05/19 2314 08/06/19 0037 08/06/19 0352 08/07/19 0807 08/07/19 1024 08/07/19 1720 08/07/19 1720 08/08/19 0335 08/08/19 0915 08/09/19 0218 08/10/19 0356 08/11/19 0301 08/12/19 0240  NA  124*   < > 129*   < >   < >  --  130*   < >  --  130* 132* 131* 132* 131*  K 4.0   < > 4.1   < >   < >  --  3.6   < >  --  3.5 3.3* 3.3* 4.8 4.6  CL 90*  --  97*   < >   < >  --  102   < >  --  104 101 97* 100 98  CO2 16*  --  19*   < >   < >  --  19*   < >  --  20* 23 24 25  21*  GLUCOSE 87  --  94   < >   < >  --  122*   < >  --  151* 137* 143* 135* 136*  BUN 8  --  9   < >   < >  --  9   < >  --  14 12 7 16 17   CREATININE 1.05*  --  1.06*   < >   < >  --  0.91   < >  --  1.00 0.94 0.82 0.74 0.84  CALCIUM 9.2  --  8.1*   < >   < >  --  8.5*   < >  --  8.4* 8.4* 8.7* 8.6* 8.7*  MG 1.8  --  1.5*  --   --  1.6* 2.1  --  2.0  --   --   --   --   --   PHOS  --   --  2.9  --   --  2.5 2.9  --  3.7  --   --   --   --   --    < > = values in this interval not displayed.   GFR: Estimated Creatinine Clearance: 61.5 mL/min (by C-G formula based on SCr of 0.84 mg/dL). Recent Labs  Lab 08/05/19 1402 08/06/19 0037 08/06/19 1020 08/06/19 1310 08/06/19 1553 08/09/19 0218 08/10/19 0356 08/11/19 0301 08/12/19 0240  WBC  --    < >  --   --    < > 8.8 8.3 6.0 7.4  LATICACIDVEN 2.7*  --  1.3 1.5  --   --   --   --   --    < > = values in this interval not displayed.    Liver Function Tests: Recent Labs  Lab 08/05/19 1200 08/06/19 0352 08/09/19 1100  AST 57* 79* 29  ALT 25 33 27  ALKPHOS 93 73 56  BILITOT 1.1 1.6* 0.7  PROT 8.1 6.6 5.6*  ALBUMIN 3.7 2.9* 2.1*   Recent Labs  Lab 08/05/19 1200  LIPASE 69*   Recent Labs  Lab 08/06/19 0035 08/09/19 1100  AMMONIA 30 23    ABG    Component Value Date/Time   PHART 7.533 (H) 08/05/2019 2314   PCO2ART 24.7 (L) 08/05/2019 2314   PO2ART 196.0 (H) 08/05/2019 2314   HCO3 20.8 08/05/2019 2314   TCO2 22 08/05/2019 2314   ACIDBASEDEF 1.0 08/05/2019 2314   O2SAT 100.0 08/05/2019 2314     Coagulation Profile: Recent Labs  Lab 08/05/19 1200 08/09/19 1100  INR 1.0 1.1    Cardiac Enzymes: No results for input(s): CKTOTAL, CKMB,  CKMBINDEX, TROPONINI in the last 168 hours.  HbA1C: Hgb A1c MFr Bld  Date/Time Value Ref Range Status  08/11/2019 03:01 AM 5.1 4.8 - 5.6 % Final    Comment:    (NOTE) Pre diabetes:          5.7%-6.4% Diabetes:              >6.4% Glycemic control for   <7.0% adults with diabetes   03/26/2015 09:21 AM 5.3 4.8 - 5.6 % Final    Comment:             Pre-diabetes: 5.7 - 6.4          Diabetes: >6.4          Glycemic control for adults with diabetes: <7.0     CBG: Recent Labs  Lab 08/11/19 1538 08/11/19 1919 08/11/19 2313 08/12/19 0316 08/12/19 0751  GLUCAP 121* 134* 137* 140* 109*    Critical care time: N/a     Baltazar Apo, MD, PhD 08/12/2019, 9:28 AM [facility] [phone] or if no answer [phone]

## 2019-08-12 NOTE — Progress Notes (Signed)
Patient was frequently getting out of bed, not following commands. Low bed and floor mats initiated. Will continue to monitor.

## 2019-08-12 NOTE — Progress Notes (Signed)
Physical Therapy Treatment Patient Details Name: Cindy [name] MRN: [id number] DOB: Dec 27, 1960 Today's Date: 08/12/2019    History of Present Illness 59 y/o F who presented to [facility] 3/19 with reports of altered mental status. Active seizures on arrival to ER.  Hx of polysubstance abuse. Intubated from 3/19-3/22.  Other PMH includes CVA and HTN.     PT Comments    Co-session with OT, but limited time due to pt leaving for swallow test.  Pt was better able to come to EOB with two person mod assist today and was able to stand weakly at EOB, having difficulty getting her LEs under her due to buckling/weakness.  She continues to have cognitive deficits, but theses seem to be improving.  I am unsure of her cognitive baseline.  She would likely do well for future transfers OOB with the stedy standing frame.  She remains appropriate for post acute rehab. PT will continue to follow acutely for safe mobility progression.  Follow Up Recommendations  CIR;Supervision/Assistance - 24 hour     Equipment Recommendations  Rolling walker with 5" wheels;3in1 (PT)    Recommendations for Other Services Rehab consult     Precautions / Restrictions Precautions Precautions: Fall Restrictions Weight Bearing Restrictions: No    Mobility  Bed Mobility Overal bed mobility: Needs Assistance Bed Mobility: Supine to Sit;Sit to Supine     Supine to sit: Mod assist;HOB elevated;+2 for physical assistance Sit to supine: Mod assist;+2 for physical assistance;HOB elevated   General bed mobility comments: Mod assist to come to sitting EOB assisting most at trunk and hips.  Mod assist to help pt position back to supine assist at bil legs and trunk.   Transfers Overall transfer level: Needs assistance Equipment used: 2 person hand held assist Transfers: Sit to/from Stand Sit to Stand: +2 safety/equipment;Mod assist         General transfer comment: Pt with blocked bil feet, posterior hips and extended knees.   She had difficutly getting her feet under her COG.  Pt when attempting to step back bil knees buckled in stance  Ambulation/Gait             General Gait Details: unable, but we (PT/OT) think she would do well with stedy standing frame.            Balance Overall balance assessment: Needs assistance Sitting-balance support: Feet supported;Bilateral upper extremity supported;No upper extremity supported Sitting balance-Leahy Scale: Poor Sitting balance - Comments: at times min assist especially when attempting to lift arms, legs or cervical spine, very rounded, stiff trunk.  Postural control: Posterior lean Standing balance support: Bilateral upper extremity supported Standing balance-Leahy Scale: Poor Standing balance comment: Two person mod                            Cognition Arousal/Alertness: Awake/alert Behavior During Therapy: WFL for tasks assessed/performed Overall Cognitive Status: Impaired/Different from baseline Area of Impairment: Orientation;Safety/judgement;Following commands;Awareness;Problem solving;Memory;Attention                 Orientation Level: Disoriented to;Time;Situation;Place Current Attention Level: Alternating Memory: Decreased short-term memory Following Commands: Follows one step commands consistently Safety/Judgement: Decreased awareness of safety;Decreased awareness of deficits Awareness: Intellectual Problem Solving: Difficulty sequencing;Slow processing;Requires verbal cues;Requires tactile cues General Comments: Processing seems better than prior session, however, difficulty with time and situation orientation.  Not sure what baseline is for Ms. Javier Glazier.  Pertinent Vitals/Pain Pain Assessment: No/denies pain           PT Goals (current goals can now be found in the care plan section) Acute Rehab PT Goals Patient Stated Goal: to go home Progress towards PT goals: Progressing toward goals     Frequency    Min 3X/week      PT Plan Current plan remains appropriate    Co-evaluation PT/OT/SLP Co-Evaluation/Treatment: Yes Reason for Co-Treatment: Complexity of the patient's impairments (multi-system involvement);Necessary to address cognition/behavior during functional activity;To address functional/ADL transfers;For patient/therapist safety PT goals addressed during session: Mobility/safety with mobility;Balance;Strengthening/ROM        AM-PAC PT "6 Clicks" Mobility   Outcome Measure  Help needed turning from your back to your side while in a flat bed without using bedrails?: A Lot Help needed moving from lying on your back to sitting on the side of a flat bed without using bedrails?: A Lot Help needed moving to and from a bed to a chair (including a wheelchair)?: A Lot Help needed standing up from a chair using your arms (e.g., wheelchair or bedside chair)?: A Lot Help needed to walk in hospital room?: Total Help needed climbing 3-5 steps with a railing? : Total 6 Click Score: 10    End of Session   Activity Tolerance: Patient limited by fatigue Patient left: in bed;with call bell/phone within reach;Other (comment)(going to swallow test with transporter and RN) Nurse Communication: Mobility status;Need for lift equipment;Other (comment)(would likely do well with stedy standing frame) PT Visit Diagnosis: Muscle weakness (generalized) (M62.81)     Time: 1245-1300 PT Time Calculation (min) (ACUTE ONLY): 15 min  Charges:  $Therapeutic Activity: 8-22 mins                    Corinna Capra, PT, DPT  Acute Rehabilitation [phone] pager #[phone] office     08/12/2019, 2:01 PM

## 2019-08-12 NOTE — Progress Notes (Addendum)
  Speech Language Pathology  Patient Details Name: Cindy [name] MRN: [id number] DOB: 10-24-1960 Today's Date: 08/12/2019 Time:  -        Pt is appropriate for an MBS to evaluate swallow integrity at 1:15 today                        Anastaisa, Wooding 08/12/2019, 9:13 AM  Breck Coons Lonell Face.Ed Nurse, children's [phone] Office [phone]

## 2019-08-13 DIAGNOSIS — I639 Cerebral infarction, unspecified: Secondary | ICD-10-CM

## 2019-08-13 DIAGNOSIS — I6340 Cerebral infarction due to embolism of unspecified cerebral artery: Secondary | ICD-10-CM | POA: Insufficient documentation

## 2019-08-13 LAB — BASIC METABOLIC PANEL
Anion gap: 12 (ref 5–15)
BUN: 13 mg/dL (ref 6–20)
CO2: 20 mmol/L — ABNORMAL LOW (ref 22–32)
Calcium: 9.3 mg/dL (ref 8.9–10.3)
Chloride: 98 mmol/L (ref 98–111)
Creatinine, Ser: 0.99 mg/dL (ref 0.44–1.00)
GFR calc Af Amer: >60 mL/min (ref >60)
GFR calc non Af Amer: >60 mL/min (ref >60)
Glucose, Bld: 133 mg/dL — ABNORMAL HIGH (ref 70–99)
Potassium: 4.5 mmol/L (ref 3.5–5.1)
Sodium: 130 mmol/L — ABNORMAL LOW (ref 135–145)

## 2019-08-13 LAB — GLUCOSE, CAPILLARY
Glucose-Capillary: 122 mg/dL — ABNORMAL HIGH (ref 70–99)
Glucose-Capillary: 153 mg/dL — ABNORMAL HIGH (ref 70–99)
Glucose-Capillary: 101 mg/dL — ABNORMAL HIGH (ref 70–99)
Glucose-Capillary: 131 mg/dL — ABNORMAL HIGH (ref 70–99)
Glucose-Capillary: 105 mg/dL — ABNORMAL HIGH (ref 70–99)

## 2019-08-13 MED ORDER — QUETIAPINE FUMARATE 25 MG PO TABS
25.0000 mg | ORAL_TABLET | Freq: Every day | ORAL | Status: DC
  Administered 2019-08-13 – 2019-08-18 (×6): 25 mg via ORAL
  Filled 2019-08-13 (×6): qty 1

## 2019-08-13 MED ORDER — LORAZEPAM 2 MG/ML IJ SOLN
1.0000 mg | INTRAMUSCULAR | Status: DC | PRN
  Administered 2019-08-13 – 2019-08-19 (×5): 1 mg via INTRAVENOUS
  Filled 2019-08-13 (×5): qty 1

## 2019-08-13 NOTE — Progress Notes (Signed)
EKG completed as ordered for Baseline QTc. Cindy [name] is 472 ms.

## 2019-08-13 NOTE — Plan of Care (Signed)
Discussed with [facility] regarding pick up of patient. In prior PCCM notes, seems that goal was [facility] 3/27 PCCM off.  [facility] will pick up beginning 3/28. [facility] has requested neurology to be consulted. I will engage neurology today.   Tessie Fass MSN, AGACNP-BC [facility] [phone] If no answer, [phone] 08/13/2019, 7:40 AM

## 2019-08-13 NOTE — Plan of Care (Signed)
  Problem: Education: Goal: Knowledge of General Education information will improve Description: Including pain rating scale, medication(s)/side effects and non-pharmacologic comfort measures Outcome: Progressing   Problem: Activity: Goal: Risk for activity intolerance will decrease Outcome: Progressing   Problem: Nutrition: Goal: Adequate nutrition will be maintained Outcome: Progressing   Problem: Pain Managment: Goal: General experience of comfort will improve Outcome: Progressing   Problem: Safety: Goal: Ability to remain free from injury will improve Outcome: Progressing   Problem: Skin Integrity: Goal: Risk for impaired skin integrity will decrease Outcome: Progressing   Problem: Education: Goal: Expressions of having a comfortable level of knowledge regarding the disease process will increase Outcome: Progressing   Problem: Coping: Goal: Ability to adjust to condition or change in health will improve Outcome: Progressing Goal: Ability to identify appropriate support needs will improve Outcome: Progressing   Problem: Health Behavior/Discharge Planning: Goal: Compliance with prescribed medication regimen will improve Outcome: Progressing   Problem: Medication: Goal: Risk for medication side effects will decrease Outcome: Progressing   Problem: Clinical Measurements: Goal: Complications related to the disease process, condition or treatment will be avoided or minimized Outcome: Progressing Goal: Diagnostic test results will improve Outcome: Progressing   Problem: Safety: Goal: Verbalization of understanding the information provided will improve Outcome: Progressing   Problem: Self-Concept: Goal: Level of anxiety will decrease Outcome: Progressing Goal: Ability to verbalize feelings about condition will improve Outcome: Progressing

## 2019-08-13 NOTE — Consult Note (Addendum)
NEURO HOSPITALIST  CONSULT   Requesting Physician: Dr. Delton Coombes    Chief Complaint: stroke seen on MRI  History obtained from:  Patient  / chart review HPI:                                                                                                                                         Cindy [name] is an 59 y.o. female  With PMH polysubstance abuse, HTN, HLD, CVA (2005) who presented to [facility] for AMS/ seizures.  Presented to [facility] on 3/19 with reports of AMS and active seizures on arrival to [facility]. She was noted to have eye deviation to the right with twitching of face and unresponsive.  Patient stated she has had seizures and a stroke before. She stated 2 years ago, but does not know why she had a seizure. Endorses drinking 6 pack of beer per day, no liquor. Denies smoking or drugs. UDS+ cocaine. She stated that her husband took he to the hospital for weakness.   Hospital course:  3/19 presented to [facility] for AMS / seizures, UDS + cocaine, Na: 125, ETOH: 21, CTH: no hemorrhage; intubated for air way protection. Treated with Ativan and keppra. Transferred to [facility] 3/21  EEG:  IMPRESSION: This is an abnormal EEG secondary to general background slowing.  This finding may be seen with a diffuse disturbance that is etiologically nonspecific, but may include a metabolic encephalopathy or post-ictal state, among other possibilities.  The fast beta activity noted likely represents a medication effect.  No epileptiform activity is noted.   3/24 MRI: 1. Scattered small foci of restricted diffusion within the bilateral cerebral hemispheres, consistent with small acute infarcts, likely Embolic 3/27: neurology consulted  Modified Rankin: Rankin Score=0  NIHSS:6 (dysarthria, weakness,questions)   Past Medical History:  Diagnosis Date  . Abnormal LFTs    Hx of  . CVA (cerebral vascular accident) (HCC) 2005  . Hyperlipidemia   . Hypertension   . Polysubstance  dependence including opioid type drug, episodic abuse (HCC)    cocaine/etoh     History reviewed. No pertinent surgical history.  Family History  Problem Relation Age of Onset  . Cancer Father        prostate   . Hypertension Mother   . Asthma Mother   . Bronchitis Mother        social History:  reports that she has never smoked. She has never used smokeless tobacco. She reports current alcohol use. She reports that she does not use drugs.  Allergies: No Known Allergies  Medications:  Scheduled: . amLODipine  10 mg Per Tube Q24H  . aspirin  325 mg Per Tube Daily  . chlorhexidine  15 mL Mouth Rinse BID  . Chlorhexidine Gluconate Cloth  6 each Topical Q0600  . cloNIDine  0.1 mg Per Tube BID  . enoxaparin (LOVENOX) injection  40 mg Subcutaneous Q24H  . insulin aspart  0-9 Units Subcutaneous Q4H  . mouth rinse  15 mL Mouth Rinse q12n4p  . multivitamin  15 mL Per Tube Daily   Continuous: . sodium chloride Stopped (08/08/19 1952)  . feeding supplement (OSMOLITE 1.2 CAL) 1,000 mL (08/10/19 1641)   ZOX:WRUEAVWUJ, hydrALAZINE, lip balm, LORazepam, metoprolol tartrate, Resource ThickenUp Clear, sennosides   ROS:                                                                                                                                       ROS was performed and is negative except as noted in HPI    General Examination:                                                                                                      Blood pressure 138/84, pulse (!) 106, temperature 98.9 F (37.2 C), temperature source Oral, resp. rate 20, height  (1.626 m), weight 58.2 kg, SpO2 97 %.  Physical Exam  Constitutional: Appears well-developed and well-nourished.  Psych: Affect appropriate to situation Eyes: Normal external eye and conjunctiva. HENT: Normocephalic, no  lesions, without obvious abnormality. Missing dentition.  Musculoskeletal-no joint tenderness, deformity or swelling Cardiovascular: Normal rate and regular rhythm.  Respiratory: Effort normal, non-labored breathing saturations WNL on RA GI: Soft.  No distension. There is no tenderness.  Skin: WDI, generalized bruising noted  Neurological Examination Mental Status: Alert, oriented name/month/year. Says she is 36 and thinks she is on [employer].  thought content appropriate. Severely dysarthric.  Speech fluent without evidence of aphasia.  Able to follow commands without difficulty.  Naming intact.  Comprehension intact. Cranial Nerves: II:  Visual fields grossly normal,  III,IV, VI: ptosis not present, extra-ocular motions intact bilaterally, pupils equal, round, reactive to light and accommodation V,VII: smile symmetric, facial light touch sensation normal bilaterally VIII: hearing normal bilaterally IX,X: uvula rises midline XI: bilateral shoulder shrug XII: midline tongue extension Motor: Right : Upper extremity   4/5  Left:     Upper extremity   5/5  Lower extremity   5/5   Lower extremity   5/5 Tone and bulk:normal tone  throughout; no atrophy noted Sensory:  light touch intact throughout, bilaterally Cerebellar: No disproportionate dysmetria noted. Gait: deferred   Lab Results: Basic Metabolic Panel: Recent Labs  Lab 08/07/19 0807 08/07/19 1024 08/07/19 1720 08/08/19 0335 08/08/19 0915 08/09/19 1610 08/09/19 9604 08/10/19 0356 08/10/19 0356 08/11/19 0301 08/12/19 0240 08/13/19 0519  NA   < >  --  130*  --    < > 132*  --  131*  --  132* 131* 130*  K   < >  --  3.6  --    < > 3.3*  --  3.3*  --  4.8 4.6 4.5  CL   < >  --  102  --    < > 101  --  97*  --  100 98 98  CO2   < >  --  19*  --    < > 23  --  24  --  25 21* 20*  GLUCOSE   < >  --  122*  --    < > 137*  --  143*  --  135* 136* 133*  BUN   < >  --  9  --    < > 12  --  7  --  CREATININE   <  >  --  0.91  --    < > 0.94  --  0.82  --  0.74 0.84 0.99  CALCIUM   < >  --  8.5*  --    < > 8.4*   < > 8.7*   < > 8.6* 8.7* 9.3  MG  --  1.6* 2.1 2.0  --   --   --   --   --   --   --   --   PHOS  --  2.5 2.9 3.7  --   --   --   --   --   --   --   --    < > = values in this interval not displayed.    CBC: Recent Labs  Lab 08/06/19 0918 08/06/19 1553 08/08/19 0335 08/09/19 0218 08/10/19 0356 08/11/19 0301 08/12/19 0240  WBC 11.7*   < > 9.5 8.8 8.3 6.0 7.4  NEUTROABS 10.0*  --   --   --   --   --   --   HGB 12.4   < > 10.9* 10.6* 12.4 11.4* 11.7*  HCT 35.4*   < > 32.8* 31.6* 35.4* 33.5* 34.5*  MCV 94.4   < > 97.9 96.3 94.1 95.4 96.1  PLT 229   < > 184 185 237 229 277   < > = values in this interval not displayed.    Lipid Panel: Recent Labs  Lab 08/07/19 0330 08/11/19 0301  CHOL  --  102  TRIG 49 31  HDL  --  50  CHOLHDL  --  2.0  VLDL  --  6  LDLCALC  --  46    CBG: Recent Labs  Lab 08/12/19 1523 08/12/19 1952 08/12/19 2301 08/13/19 0324 08/13/19 0738  GLUCAP 125* 137* 127* 122* 153*    Imaging: DG Swallowing Func-Speech Pathology  Result Date: 08/12/2019 Objective Swallowing Evaluation: Type of Study: MBS-Modified Barium Swallow Study  Patient Details Name: Cindy [name] MRN: [id number] Date of Birth: [birth year] Today's Date: 08/12/2019 Time: SLP Start Time (ACUTE ONLY): 1315 -SLP Stop Time (ACUTE ONLY): 1330 SLP Time Calculation (min) (ACUTE ONLY): 15 min  Past Medical History: Past Medical History: Diagnosis Date . Abnormal LFTs   Hx of . CVA (cerebral vascular accident) (HCC) 2005 . Hyperlipidemia  . Hypertension  . Polysubstance dependence including opioid type drug, episodic abuse (HCC)   cocaine/etoh  Past Surgical History: No past surgical history on file. HPI: 59 [facility] F [facility] with reports of altered mental status. Active seizures on arrival to ER.  Hx of polysubstance abuse. Intubated from 3/19-3/22.  No data recorded Assessment / Plan /  Recommendation CHL IP CLINICAL IMPRESSIONS 08/12/2019 Clinical Impression MBS completed with pt exhibitng mild oropharyngeal deficits leading to penetration with thin liquids due to incomplete laryngeal closure. Questionable trace amount of barium reaching vocal cords versus higher in vestibule (very faint). Decreased cohesion with lingual residue in right buccal cavity. Swallow was slower to trigger with barium reaching valleculae and once pyriform sinsues before laryngeal protection completed. Recommend initiation of Dys 1, nectar thick liquids, straws allowed, aks pt to clear throat intermittently, full assist for feeding and swallow strategies. ST will continue to treat.     SLP Visit Diagnosis Dysphagia, oropharyngeal phase (R13.12) Attention and concentration deficit following -- Frontal lobe and executive function deficit following -- Impact on safety and function Mild aspiration risk   CHL IP TREATMENT RECOMMENDATION 08/12/2019 Treatment Recommendations Therapy as outlined in treatment plan below   Prognosis 08/12/2019 Prognosis for Safe Diet Advancement Fair Barriers to Reach Goals -- Barriers/Prognosis Comment -- CHL IP DIET RECOMMENDATION 08/12/2019 SLP Diet Recommendations Dysphagia 1 (Puree) solids;Nectar thick liquid Liquid Administration via Cup;No straw;Straw Medication Administration Crushed with puree Compensations Minimize environmental distractions;Slow rate;Small sips/bites;Clear throat intermittently Postural Changes Seated upright at 90 degrees   CHL IP OTHER RECOMMENDATIONS 08/12/2019 Recommended Consults -- Oral Care Recommendations Oral care BID Other Recommendations --   CHL IP FOLLOW UP RECOMMENDATIONS 08/12/2019 Follow up Recommendations [facility] D[facility] 08/12/2019 Speech Therapy Frequency (ACUTE ONLY) min 2x/week Treatment Duration 2 weeks      CHL IP ORAL PHASE 08/12/2019 Oral Phase Impaired Oral - Pudding Teaspoon -- Oral - Pudding Cup -- Oral - Honey  Teaspoon -- Oral - Honey Cup WFL Oral - Nectar Teaspoon -- Oral - Nectar Cup Decreased bolus cohesion;Right anterior bolus loss Oral - Nectar Straw Decreased bolus cohesion Oral - Thin Teaspoon -- Oral - Thin Cup Decreased bolus cohesion;Delayed oral transit Oral - Thin Straw WFL Oral - Puree Delayed oral transit;Lingual/palatal residue Oral - Mech Soft -- Oral - Regular -- Oral - Multi-Consistency -- Oral - Pill -- Oral Phase - Comment --  CHL IP PHARYNGEAL PHASE 08/12/2019 Pharyngeal Phase Impaired Pharyngeal- Pudding Teaspoon -- Pharyngeal -- Pharyngeal- Pudding Cup -- Pharyngeal -- Pharyngeal- Honey Teaspoon -- Pharyngeal -- Pharyngeal- Honey Cup Delayed swallow initiation-vallecula Pharyngeal -- Pharyngeal- Nectar Teaspoon -- Pharyngeal -- Pharyngeal- Nectar Cup Delayed swallow initiation-vallecula Pharyngeal -- Pharyngeal- Nectar Straw WFL Pharyngeal -- Pharyngeal- Thin Teaspoon -- Pharyngeal -- Pharyngeal- Thin Cup Delayed swallow initiation-pyriform sinuses;Delayed swallow initiation-vallecula Pharyngeal -- Pharyngeal- Thin Straw Penetration/Aspiration during swallow Pharyngeal Material enters airway, CONTACTS cords and then ejected out;Material enters airway, CONTACTS cords and not ejected out Pharyngeal- Puree WFL Pharyngeal -- Pharyngeal- Mechanical Soft -- Pharyngeal -- Pharyngeal- Regular -- Pharyngeal -- Pharyngeal- Multi-consistency -- Pharyngeal -- Pharyngeal- Pill -- Pharyngeal -- Pharyngeal Comment --  CHL IP CERVICAL ESOPHAGEAL PHASE 08/12/2019 Cervical Esophageal Phase WFL Pudding Teaspoon -- Pudding Cup -- Honey Teaspoon -- Honey Cup -- Nectar Teaspoon -- Nectar Cup --  Nectar Straw -- Thin Teaspoon -- Thin Cup -- Thin Straw -- Puree -- Mechanical Soft -- Regular -- Multi-consistency -- Pill -- Cervical Esophageal Comment -- Cindy [name], Cindy [name] 08/12/2019, 3:23 PM              VAS US CAROTID  Result Date: 08/11/2019 Carotid Arterial Duplex Study Indications:       Altered mental status, active  seizures and history of                    polysubstance abuse. Risk Factors:      Prior CVA. Comparison Study:  No priors. Performing Technologist: Marilynne Halsted RDMS, RVT  Examination Guidelines: A complete evaluation includes B-mode imaging, spectral Doppler, color Doppler, and power Doppler as needed of all accessible portions of each vessel. Bilateral testing is considered an integral part of a complete examination. Limited examinations for reoccurring indications may be performed as noted.  Right Carotid Findings: +----------+--------+--------+--------+------------------+------------------+           PSV cm/sEDV cm/sStenosisPlaque DescriptionComments           +----------+--------+--------+--------+------------------+------------------+ CCA Prox  85      18                                                   +----------+--------+--------+--------+------------------+------------------+ CCA Distal59      17                                                   +----------+--------+--------+--------+------------------+------------------+ ICA Prox  57      12      1-39%                     intimal thickening +----------+--------+--------+--------+------------------+------------------+ ICA Distal65      22                                                   +----------+--------+--------+--------+------------------+------------------+ ECA       64      10                                                   +----------+--------+--------+--------+------------------+------------------+ +----------+--------+-------+----------------+-------------------+           PSV cm/sEDV cmsDescribe        Arm Pressure (mmHG) +----------+--------+-------+----------------+-------------------+ [id number]            Multiphasic, WNL                    +----------+--------+-------+----------------+-------------------+ +---------+--------+--+--------+--+---------+ VertebralPSV cm/s66EDV  cm/s19Antegrade +---------+--------+--+--------+--+---------+  Left Carotid Findings: +----------+--------+--------+--------+------------------+------------------+           PSV cm/sEDV cm/sStenosisPlaque DescriptionComments           +----------+--------+--------+--------+------------------+------------------+ CCA Prox  90      16                                                   +----------+--------+--------+--------+------------------+------------------+  CCA Distal57      16                                                   +----------+--------+--------+--------+------------------+------------------+ ICA Prox  44      15      1-39%                     intimal thickening +----------+--------+--------+--------+------------------+------------------+ ICA Distal62      24                                                   +----------+--------+--------+--------+------------------+------------------+ ECA       57      11                                                   +----------+--------+--------+--------+------------------+------------------+ +----------+--------+--------+----------------+-------------------+           PSV cm/sEDV cm/sDescribe        Arm Pressure (mmHG) +----------+--------+--------+----------------+-------------------+ [id number]             Multiphasic, WNL                    +----------+--------+--------+----------------+-------------------+ +---------+--------+--+--------+--+---------+ VertebralPSV cm/s49EDV cm/s11Antegrade +---------+--------+--+--------+--+---------+   Summary: Right Carotid: Velocities in the right ICA are consistent with a 1-39% stenosis. Left Carotid: Velocities in the left ICA are consistent with a 1-39% stenosis. Vertebrals:  Bilateral vertebral arteries demonstrate antegrade flow. Subclavians: Normal flow hemodynamics were seen in bilateral subclavian              arteries. *See table(s) above for  measurements and observations.  Electronically signed by Lemar Livings MD on 08/11/2019 at 4:37:38 PM.    Final    VAS [place] LOWER EXTREMITY VENOUS (DVT)  Result Date: 08/11/2019  Lower Venous DVTStudy Indications: Embolic stroke, active seizures and hx of polysubstance abuse.  Comparison Study: No priors. Performing Technologist: Marilynne Halsted RDMS, RVT  Examination Guidelines: A complete evaluation includes B-mode imaging, spectral Doppler, color Doppler, and power Doppler as needed of all accessible portions of each vessel. Bilateral testing is considered an integral part of a complete examination. Limited examinations for reoccurring indications may be performed as noted. The reflux portion of the exam is performed with the patient in reverse Trendelenburg.  +---------+---------------+---------+-----------+----------+--------------+ RIGHT    CompressibilityPhasicitySpontaneityPropertiesThrombus Aging +---------+---------------+---------+-----------+----------+--------------+ CFV      Full           Yes      Yes                                 +---------+---------------+---------+-----------+----------+--------------+ SFJ      Full                                                        +---------+---------------+---------+-----------+----------+--------------+ FV Prox  Full                                                        +---------+---------------+---------+-----------+----------+--------------+ FV Mid   Full                                                        +---------+---------------+---------+-----------+----------+--------------+ FV DistalFull                                                        +---------+---------------+---------+-----------+----------+--------------+ PFV      Full                                                        +---------+---------------+---------+-----------+----------+--------------+ POP      Full           Yes       Yes                                 +---------+---------------+---------+-----------+----------+--------------+ PTV      Full                                                        +---------+---------------+---------+-----------+----------+--------------+ PERO     Full                                                        +---------+---------------+---------+-----------+----------+--------------+   +---------+---------------+---------+-----------+----------+--------------+ LEFT     CompressibilityPhasicitySpontaneityPropertiesThrombus Aging +---------+---------------+---------+-----------+----------+--------------+ CFV      Full           Yes      Yes                                 +---------+---------------+---------+-----------+----------+--------------+ SFJ      Full                                                        +---------+---------------+---------+-----------+----------+--------------+ FV Prox  Full                                                        +---------+---------------+---------+-----------+----------+--------------+  FV Mid   Full                                                        +---------+---------------+---------+-----------+----------+--------------+ FV DistalFull                                                        +---------+---------------+---------+-----------+----------+--------------+ PFV      Full                                                        +---------+---------------+---------+-----------+----------+--------------+ POP      Full           Yes      Yes                                 +---------+---------------+---------+-----------+----------+--------------+ PTV      Full                                                        +---------+---------------+---------+-----------+----------+--------------+ PERO     Full                                                         +---------+---------------+---------+-----------+----------+--------------+     *See table(s) above for measurements and observations. Electronically signed by Lemar Livings MD on 08/11/2019 at 4:37:29 PM.    Final        Valentina Lucks, MSN, NP-C [facility] [phone]  08/13/2019, 9:03 AM    2D echo March 2021 IMPRESSIONS  1. Left ventricular ejection fraction, by estimation, is 60 to 65%. The  left ventricle has normal function. The left ventricle has no regional  wall motion abnormalities. There is mild left ventricular hypertrophy.  Left ventricular diastolic parameters  are consistent with Grade I diastolic dysfunction (impaired relaxation).  2. Right ventricular systolic function is normal. The right ventricular  size is normal.  3. The mitral valve is grossly normal. Mild mitral valve regurgitation.  4. The aortic valve is tricuspid. Aortic valve regurgitation is not  visualized.   Assessment: 59 y.o. female with PMH CVA(2005), polysubstance abuse, HTN,  HLD who presented to [facility] with seizures and AMS. Treated with keppra and ativan. Cocaine +. MRI showed scattered infarcts in bilateral hemispheres. LDL is 46 and Hgba1c is 5.1. WBC is WNL and patient has been afebrile this admission. EEG findings nonspecific-no status, encephalopathy versus postictal state.    Etiology of infarcts likely secondary to vasospasm from cocaine abuse versus septic emboli. . Stroke Risk Factors - hyperlipidemia and hypertension    Recommendations: -- BP goal :  normotensive --CTA head and neck --Echocardiogram-consider TTE.  Blood cultures pending. -- Prophylactic therapy-Antiplatelet med-hold until vascular imaging is done to ensure that there is no evidence of mycotic aneurysms.  And endocarditis is ruled out -- PT consult, OT consult, Speech consult --Telemetry monitoring --Frequent neuro checks --Stroke swallow screen  --please page stroke NP  Or  PA  Or MD from 8am -4 pm   as this patient from this time will be  followed by the stroke.   You can look them up on [email or web address]  Cindy [name]  Attending Neurohospitalist Addendum Patient seen and examined with APP/Resident. Agree with the history and physical as documented above. Agree with the plan as documented, which I helped formulate. I have independently reviewed the chart, obtained history, review of systems and examined the patient.I have personally reviewed pertinent head/neck/spine imaging (CT/MRI). Please feel free to call with any questions. --- Amie Portland, MD [facility] Pager: [phone]  If 7pm to 7am, please call on call as listed on AMION.

## 2019-08-13 NOTE — Plan of Care (Signed)
  Problem: Nutrition: Goal: Adequate nutrition will be maintained Outcome: Progressing  Patient with tube feed at continuous rate, total assist to feed dysphagia 1 diet.   Problem: Education: Goal: Knowledge of General Education information will improve Description: Including pain rating scale, medication(s)/side effects and non-pharmacologic comfort measures Outcome: Not Progressing    Frequent rounding, safety mitts and medications required to prevent patient from pulling on lines and drains.

## 2019-08-13 NOTE — Progress Notes (Signed)
NAME:  Cindy [name], MRN:  [id number], DOB:  1960-08-09, LOS: 8 ADMISSION DATE:  08/05/2019, CONSULTATION DATE:  08/05/19 REFERRING MD:  Dr. Rogene Houston, CHIEF COMPLAINT:  AMS    Brief History   59 y/o F who presented to [facility] 3/19 with reports of altered mental status. Active seizures on arrival to ER.  Hx of polysubstance abuse.   History of present illness   58 y/o F who presented to [facility] on 3/19 via EMS with reports of AMS.    The patient was actively seizing on arrival to ER.  She was noted to have eye deviation to the right with twitching of the face / unresponsive. There were concerns of vomiting prior to arrival.  She was treated with IV ativan on arrival and keppra.  The patient was intubated for airway protection. UDS was noted to be cocaine positive.  Salicylate level <7, ETOH 21.  CXR with ETT in appropriate position, no infiltrate. Initial labs - Na 124, K 4, Cl 90, CO2 16, glucose 87, BUN 8, Sr Cr 1.08, AG 18, lipase 69, AST 57, ALT 25, BNP 121, troponin 33, WBC 10.9, Hgb 14.9 and platelets 338.    The patient was transferred to [facility] for further evaluation.   Past Medical History  CVA - 2005 HTN HLD  Abnormal LFT's  Polysubstance Abuse   Significant Hospital Events   3/19 Admit with AMS, active seizures on arrival, intubated  3/22 Self-extubated  Consults:  PCCM   Procedures:  ETT 3/19 >> 3/22  Significant Diagnostic Tests:   CT Head 3/19 >> no acute intracranial pathology, extensive periventricular and deep white matter hypodensity markedly increased compared to remote prior CT 2006.    EEG 3/22 Negative for seizures  MRI brain 3/24 >> scattered small bilateral cerebral foci suggestive of small acute infarcts  Carotid Doppler ultrasound 3/25 >> bilateral ICA stenotic changes 1-39%, vertebrals with antegrade flow, normal subclavians  Lower extremity Doppler 3/25 >> no evidence DVT  Micro Data:  UA 3/19 >> glucose >500, 5 ketones, 100 protein, rare bacteria, 0-5  WBC Resp culture 3/19>Few GPC Blood 3/20 >> negative Blood 3/25 >>   Antimicrobials:  Ceftriaxone 3/20-3/21 Azithro 3/20-3/21  Interim history/subjective:  Awake. Denies any symptoms.  Objective   Blood pressure (!) 132/116, pulse (!) 115, temperature 98.4 F (36.9 C), temperature source Oral, resp. rate 20, height 5\' 4"  (1.626 m), weight 58.2 kg, SpO2 98 %.        Intake/Output Summary (Last 24 hours) at 08/13/2019 1843 Last data filed at 08/13/2019 1813 Gross per 24 hour  Intake 1573 ml  Output 2150 ml  Net -577 ml   Filed Weights   08/10/19 0353 08/11/19 0307 08/12/19 0500  Weight: 56 kg 58.3 kg 58.2 kg   Physical Exam: General: Chronically ill-appearing woman HENT: NG tube still in place, no upper airway secretions, no stridor Respiratory: Clear bilaterally.  Strong cough Cardiovascular: Regular, distant, no murmur GI: Nondistended, positive bowel sounds Extremities: No edema Neuro: Stronger today, interacting, able to carry on a conversation.  Poorly oriented.  Speech dysarthric. Skin: Right flank bruising unchanged  Resolved Hospital Problem list   AKI Hyperkalemia Acute respiratory failure with hypoxemia Coffee-ground emesis  Assessment & Plan:   Acute Metabolic Encephalopathy   Polysubstance abuse Alcohol withdrawal History of prior CVA per chart review Bilateral suspect embolic CVAs (appear acute) CVAs noted on MRI brain.  Aspirin added 3/25.  Repeated blood cultures on 3/25 given the suspected embolic nature of  the strokes.  TTE was negative, did not show evidence for vegetation or intra-atrial shunt.  Carotid Dopplers reassuring.  She will need some rehabilitation, PT but no other clear work-up indicated Precedex to off Continue clonidine as ordered Seizure precautions Appreciate SLP evaluation, planning for MBS on 3/26.  Hopefully will be able to initiate a diet Completed thiamine and folate.  Continue multivitamin Substance abuse counseling when/if  she is willing to participate Push PT OT  Acute Respiratory Failure with hypoxia Intubation for airway protection but vomiting prior to arrival. Self-extubated 3/22 and appears to be protecting airway. Pulmonary hygiene Careful with sedating medications, avoid oversedation  Coffee ground emesis at presentation- no further episodes No intervention Continue PPI, follow CBC  Hyponatremia, appears hypovolemic, stable 131 on 3/26 In setting of ETOH abuse  Follow BMP, volume status  R flank mass/hematoma - improving likely hematoma raising concern for abdominal/chest wall trauma. Tolerating enoxaparin, no intervention, follow intermittent CBC    Best practice:  Diet: NPO, tube feeding via NG tube Pain/Anxiety/Delirium protocol (if indicated): None VAP protocol (if indicated):  DVT prophylaxis: SCDs, lovenox GI prophylaxis: PPI BID Glucose control: SSI Mobility: bed level Code Status: Full  Family Communication: spoke with husband at bedside 3/24 and 3/25 Disposition: Plan transfer to floor on 3/26  Labs   CBC: Recent Labs  Lab 08/08/19 0335 08/09/19 0218 08/10/19 0356 08/11/19 0301 08/12/19 0240  WBC 9.5 8.8 8.3 6.0 7.4  HGB 10.9* 10.6* 12.4 11.4* 11.7*  HCT 32.8* 31.6* 35.4* 33.5* 34.5*  MCV 97.9 96.3 94.1 95.4 96.1  PLT 184 185 237 229 277    Basic Metabolic Panel: Recent Labs  Lab 08/07/19 0807 08/07/19 1024 08/07/19 1720 08/08/19 0335 08/08/19 0915 08/09/19 0218 08/10/19 0356 08/11/19 0301 08/12/19 0240 08/13/19 0519  NA   < >  --  130*  --    < > 132* 131* 132* 131* 130*  K   < >  --  3.6  --    < > 3.3* 3.3* 4.8 4.6 4.5  CL   < >  --  102  --    < > 101 97* 100 98 98  CO2   < >  --  19*  --    < > 23 24 25  21* 20*  GLUCOSE   < >  --  122*  --    < > 137* 143* 135* 136* 133*  BUN   < >  --  9  --    < > 12 7 16 17 13   CREATININE   < >  --  0.91  --    < > 0.94 0.82 0.74 0.84 0.99  CALCIUM   < >  --  8.5*  --    < > 8.4* 8.7* 8.6* 8.7* 9.3  MG  --   1.6* 2.1 2.0  --   --   --   --   --   --   PHOS  --  2.5 2.9 3.7  --   --   --   --   --   --    < > = values in this interval not displayed.   GFR: Estimated Creatinine Clearance: 52.2 mL/min (by C-G formula based on SCr of 0.99 mg/dL). Recent Labs  Lab 08/09/19 0218 08/10/19 0356 08/11/19 0301 08/12/19 0240  WBC 8.8 8.3 6.0 7.4    Liver Function Tests: Recent Labs  Lab 08/09/19 1100  AST 29  ALT 27  ALKPHOS  56  BILITOT 0.7  PROT 5.6*  ALBUMIN 2.1*   No results for input(s): LIPASE, AMYLASE in the last 168 hours. Recent Labs  Lab 08/09/19 1100  AMMONIA 23    ABG    Component Value Date/Time   PHART 7.533 (H) 08/05/2019 2314   PCO2ART 24.7 (L) 08/05/2019 2314   PO2ART 196.0 (H) 08/05/2019 2314   HCO3 20.8 08/05/2019 2314   TCO2 22 08/05/2019 2314   ACIDBASEDEF 1.0 08/05/2019 2314   O2SAT 100.0 08/05/2019 2314     Coagulation Profile: Recent Labs  Lab 08/09/19 1100  INR 1.1    Cardiac Enzymes: No results for input(s): CKTOTAL, CKMB, CKMBINDEX, TROPONINI in the last 168 hours.  HbA1C: Hgb A1c MFr Bld  Date/Time Value Ref Range Status  08/11/2019 03:01 AM 5.1 4.8 - 5.6 % Final    Comment:    (NOTE) Pre diabetes:          5.7%-6.4% Diabetes:              >6.4% Glycemic control for   <7.0% adults with diabetes   03/26/2015 09:21 AM 5.3 4.8 - 5.6 % Final    Comment:             Pre-diabetes: 5.7 - 6.4          Diabetes: >6.4          Glycemic control for adults with diabetes: <7.0     CBG: Recent Labs  Lab 08/12/19 2301 08/13/19 0324 08/13/19 0738 08/13/19 1126 08/13/19 1727  GLUCAP 127* 122* 153* 101* 131*    Lynnell Catalan, MD [facility] ICU Physician [facility]  Pager: [phone] Mobile: [phone] After hours: [phone].  08/13/2019, 6:48 PM

## 2019-08-13 NOTE — Progress Notes (Signed)
Pt continues to scoot down in bed after multiple re-education and repositioning. Pt w/ TF via cortark

## 2019-08-14 DIAGNOSIS — K922 Gastrointestinal hemorrhage, unspecified: Secondary | ICD-10-CM

## 2019-08-14 LAB — GLUCOSE, CAPILLARY
Glucose-Capillary: 104 mg/dL — ABNORMAL HIGH (ref 70–99)
Glucose-Capillary: 111 mg/dL — ABNORMAL HIGH (ref 70–99)
Glucose-Capillary: 114 mg/dL — ABNORMAL HIGH (ref 70–99)
Glucose-Capillary: 114 mg/dL — ABNORMAL HIGH (ref 70–99)
Glucose-Capillary: 120 mg/dL — ABNORMAL HIGH (ref 70–99)
Glucose-Capillary: 131 mg/dL — ABNORMAL HIGH (ref 70–99)
Glucose-Capillary: 134 mg/dL — ABNORMAL HIGH (ref 70–99)

## 2019-08-14 MED ORDER — SIMVASTATIN 20 MG PO TABS
20.0000 mg | ORAL_TABLET | Freq: Every day | ORAL | Status: DC
  Administered 2019-08-14 – 2019-08-18 (×5): 20 mg via ORAL
  Filled 2019-08-14 (×5): qty 1

## 2019-08-14 NOTE — Progress Notes (Signed)
PROGRESS NOTE    Cindy [name]  DTO:[id number] DOB: 1961-03-22 DOA: 08/05/2019 PCP: Avon Gully, MD  Outpatient Specialists:   Brief Narrative: As per H&P done by ICU team on 08/05/2019 "59 y/o F who presented to [facility] on 3/19 via EMS with reports of AMS.    The patient was actively seizing on arrival to ER.  She was noted to have eye deviation to the right with twitching of the face / unresponsive. There were concerns of vomiting prior to arrival.  She was treated with IV ativan on arrival and keppra.  The patient was intubated for airway protection. UDS was noted to be cocaine positive.  Salicylate level <7, ETOH 21.  CXR with ETT in appropriate position, no infiltrate. Initial labs - Na 124, K 4, Cl 90, CO2 16, glucose 87, BUN 8, Sr Cr 1.08, AG 18, lipase 69, AST 57, ALT 25, BNP 121, troponin 33, WBC 10.9, Hgb 14.9 and platelets 338.    The patient was transferred to [facility] for further evaluation".   08/14/2019: The hospitalist team assumed care of the patient today.  Current problems include seizure, multiple infarcts with MRI brain revealing acute ischemic changes with embolic pattern, history of polysubstance abuse with UDS revealing cocaine, hypertension and hyperlipidemia.  Assessment & Plan:   Active Problems:   Status epilepticus (HCC)   Acute encephalopathy   Cerebral embolism with cerebral infarction  Stroke, ischemic with embolic pattern: -MRI brain revealed scattered small foci of restricted diffusion within the bilateral cerebral hemispheres, consistent with small acute infarcts, likely embolic. -UDS was positive for cocaine. -Seizures noted, deemed likely secondary to cocaine -Patient is currently on aspirin. -EEG revealed: "abnormal EEG secondary to general background slowing. This finding may be seen with a diffuse disturbance that is etiologically nonspecific, but may include a metabolic encephalopathy or post-ictal state, among other possibilities.The fast beta  activity noted likely represents a medication effect.No epileptiform activityis noted". -Carotid Doppler - unremarkable -2D Echo - EF 60 - 65%. No cardiac source of emboli identified.  -LDL - 46 -HgbA1c - 5.1  Hypertension:  -Continue current medication. -Blood pressure is at goal.  Hyperlipidemia: LDL is 46. Continue statin.  DVT prophylaxis: Subcu Lovenox Code Status: Full code Family Communication:  Disposition Plan: CIR   Consultants:   Neurology  Procedures:   Echo  Antimicrobials:   None   Subjective: No new complaints  Objective: Vitals:   08/14/19 0332 08/14/19 0343 08/14/19 0741 08/14/19 1100  BP: 135/80  123/83 115/76  Pulse: 98  100 89  Resp: 18  18 18   Temp: 98.2 F (36.8 C)  98.1 F (36.7 C) 97.8 F (36.6 C)  TempSrc: Oral  Oral Oral  SpO2: 97%  97% 98%  Weight:  73.9 kg    Height:        Intake/Output Summary (Last 24 hours) at 08/14/2019 1458 Last data filed at 08/14/2019 0500 Gross per 24 hour  Intake 8223 ml  Output 1300 ml  Net 6923 ml   Filed Weights   08/11/19 0307 08/12/19 0500 08/14/19 0343  Weight: 58.3 kg 58.2 kg 73.9 kg    Examination:  General exam: Appears calm and comfortable.  Facial asymmetry. Respiratory system: Clear to auscultation.  Cardiovascular system: S1 & S2 heard. Gastrointestinal system: Abdomen is nondistended, soft and nontender. No organomegaly or masses felt. Normal bowel sounds heard. Central nervous system: Alert and oriented.  Right-sided weakness Extremities: No leg edema  Data Reviewed: I have personally reviewed following labs  and imaging studies  CBC: Recent Labs  Lab 08/08/19 0335 08/09/19 0218 08/10/19 0356 08/11/19 0301 08/12/19 0240  WBC 9.5 8.8 8.3 6.0 7.4  HGB 10.9* 10.6* 12.4 11.4* 11.7*  HCT 32.8* 31.6* 35.4* 33.5* 34.5*  MCV 97.9 96.3 94.1 95.4 96.1  PLT 184 185 237 229 277   Basic Metabolic Panel: Recent Labs  Lab 08/07/19 1720 08/08/19 0335 08/08/19 0915  08/09/19 0218 08/10/19 0356 08/11/19 0301 08/12/19 0240 08/13/19 0519  NA 130*  --    < > 132* 131* 132* 131* 130*  K 3.6  --    < > 3.3* 3.3* 4.8 4.6 4.5  CL 102  --    < > 101 97* 100 98 98  CO2 19*  --    < > 23 24 25  21* 20*  GLUCOSE 122*  --    < > 137* 143* 135* 136* 133*  BUN 9  --    < > 12 7 16 17 13   CREATININE 0.91  --    < > 0.94 0.82 0.74 0.84 0.99  CALCIUM 8.5*  --    < > 8.4* 8.7* 8.6* 8.7* 9.3  MG 2.1 2.0  --   --   --   --   --   --   PHOS 2.9 3.7  --   --   --   --   --   --    < > = values in this interval not displayed.   GFR: Estimated Creatinine Clearance: 59.5 mL/min (by C-G formula based on SCr of 0.99 mg/dL). Liver Function Tests: Recent Labs  Lab 08/09/19 1100  AST 29  ALT 27  ALKPHOS 56  BILITOT 0.7  PROT 5.6*  ALBUMIN 2.1*   No results for input(s): LIPASE, AMYLASE in the last 168 hours. Recent Labs  Lab 08/09/19 1100  AMMONIA 23   Coagulation Profile: Recent Labs  Lab 08/09/19 1100  INR 1.1   Cardiac Enzymes: No results for input(s): CKTOTAL, CKMB, CKMBINDEX, TROPONINI in the last 168 hours. BNP (last 3 results) No results for input(s): PROBNP in the last 8760 hours. HbA1C: No results for input(s): HGBA1C in the last 72 hours. CBG: Recent Labs  Lab 08/13/19 2022 08/14/19 0022 08/14/19 0438 08/14/19 0740 08/14/19 1059  GLUCAP 105* 120* 114* 134* 104*   Lipid Profile: No results for input(s): CHOL, HDL, LDLCALC, TRIG, CHOLHDL, LDLDIRECT in the last 72 hours. Thyroid Function Tests: No results for input(s): TSH, T4TOTAL, FREET4, T3FREE, THYROIDAB in the last 72 hours. Anemia Panel: No results for input(s): VITAMINB12, FOLATE, FERRITIN, TIBC, IRON, RETICCTPCT in the last 72 hours. Urine analysis:    Component Value Date/Time   COLORURINE AMBER (A) 08/05/2019 2229   APPEARANCEUR HAZY (A) 08/05/2019 2229   LABSPEC 1.018 08/05/2019 2229   PHURINE 6.0 08/05/2019 2229   GLUCOSEU 50 (A) 08/05/2019 2229   HGBUR MODERATE (A)  08/05/2019 2229   BILIRUBINUR NEGATIVE 08/05/2019 2229   KETONESUR 20 (A) 08/05/2019 2229   PROTEINUR 100 (A) 08/05/2019 2229   NITRITE NEGATIVE 08/05/2019 2229   LEUKOCYTESUR SMALL (A) 08/05/2019 2229   Sepsis Labs: @LABRCNTIP (procalcitonin:4,lacticidven:4)  ) Recent Results (from the past 240 hour(s))  Respiratory Panel by RT PCR (Flu A&B, Covid) - Nasopharyngeal Swab     Status: None   Collection Time: 08/05/19 12:00 PM   Specimen: Nasopharyngeal Swab  Result Value Ref Range Status   SARS Coronavirus 2 by RT PCR NEGATIVE NEGATIVE Final    Comment: (NOTE) SARS-CoV-2  target nucleic acids are NOT DETECTED. The SARS-CoV-2 RNA is generally detectable in upper respiratoy specimens during the acute phase of infection. The lowest concentration of SARS-CoV-2 viral copies this assay can detect is 131 copies/mL. A negative result does not preclude SARS-Cov-2 infection and should not be used as the sole basis for treatment or other patient management decisions. A negative result may occur with  improper specimen collection/handling, submission of specimen other than nasopharyngeal swab, presence of viral mutation(s) within the areas targeted by this assay, and inadequate number of viral copies (<131 copies/mL). A negative result must be combined with clinical observations, patient history, and epidemiological information. The expected result is Negative. Fact Sheet for Patients:  https://www.moore.com/ Fact Sheet for Healthcare Providers:  https://www.young.biz/ This test is not yet ap proved or cleared by the [place] FDA and  has been authorized for detection and/or diagnosis of SARS-CoV-2 by FDA under an Emergency Use Authorization (EUA). This EUA will remain  in effect (meaning this test can be used) for the duration of the COVID-19 declaration under Section 564(b)(1) of the Act, 21 U.S.C. section 360bbb-3(b)(1), unless the authorization  is terminated or revoked sooner.    Influenza A by PCR NEGATIVE NEGATIVE Final   Influenza B by PCR NEGATIVE NEGATIVE Final    Comment: (NOTE) The Xpert Xpress SARS-CoV-2/FLU/RSV assay is intended as an aid in  the diagnosis of influenza from Nasopharyngeal swab specimens and  should not be used as a sole basis for treatment. Nasal washings and  aspirates are unacceptable for Xpert Xpress SARS-CoV-2/FLU/RSV  testing. Fact Sheet for Patients: https://www.moore.com/ Fact Sheet for Healthcare Providers: https://www.young.biz/ This test is not yet approved or cleared by the [place] FDA and  has been authorized for detection and/or diagnosis of SARS-CoV-2 by  FDA under an Emergency Use Authorization (EUA). This EUA will remain  in effect (meaning this test can be used) for the duration of the  Covid-19 declaration under Section 564(b)(1) of the Act, 21  U.S.C. section 360bbb-3(b)(1), unless the authorization is  terminated or revoked. Performed at [facility], [address] [place], [place] [address]   MRSA PCR Screening     Status: None   Collection Time: 08/05/19 10:02 PM   Specimen: Nasal Mucosa; Nasopharyngeal  Result Value Ref Range Status   MRSA by PCR NEGATIVE NEGATIVE Final    Comment:        The GeneXpert MRSA Assay (FDA approved for NASAL specimens only), is one component of a comprehensive MRSA colonization surveillance program. It is not intended to diagnose MRSA infection nor to guide or monitor treatment for MRSA infections. Performed at [facility], [address] [place], [place] [address]   Culture, respiratory (tracheal aspirate)     Status: None   Collection Time: 08/05/19 11:17 PM   Specimen: Tracheal Aspirate; Respiratory  Result Value Ref Range Status   Specimen Description TRACHEAL ASPIRATE  Final   Special Requests NONE  Final   Gram Stain   Final    FEW WBC PRESENT, PREDOMINANTLY PMN FEW GRAM  POSITIVE COCCI    Culture   Final    Consistent with normal respiratory flora. Performed at [facility], [address], [place], [place] [address]    Report Status 08/08/2019 FINAL  Final  Culture, blood (Routine X 2) w Reflex to ID Panel     Status: None   Collection Time: 08/06/19 12:51 AM   Specimen: BLOOD LEFT HAND  Result Value Ref  Range Status   Specimen Description BLOOD LEFT HAND  Final   Special Requests   Final    AEROBIC BOTTLE ONLY Blood Culture results may not be optimal due to an inadequate volume of blood received in culture bottles   Culture   Final    NO GROWTH 5 DAYS Performed at [facility], [place] [address], [place], [place] [address]    Report Status 08/11/2019 FINAL  Final  Culture, blood (Routine X 2) w Reflex to ID Panel     Status: None   Collection Time: 08/06/19 12:55 AM   Specimen: BLOOD RIGHT HAND  Result Value Ref Range Status   Specimen Description BLOOD RIGHT HAND  Final   Special Requests   Final    AEROBIC BOTTLE ONLY Blood Culture results may not be optimal due to an inadequate volume of blood received in culture bottles   Culture   Final    NO GROWTH 5 DAYS Performed at [facility], [place] [address] [place], [place] [address]    Report Status 08/11/2019 FINAL  Final  Culture, blood (Routine X 2) w Reflex to ID Panel     Status: None (Preliminary result)   Collection Time: 08/11/19  9:40 AM   Specimen: BLOOD  Result Value Ref Range Status   Specimen Description BLOOD LEFT ANTECUBITAL  Final   Special Requests   Final    BOTTLES DRAWN AEROBIC ONLY Blood Culture results may not be optimal due to an inadequate volume of blood received in culture bottles   Culture   Final    NO GROWTH 3 DAYS Performed at [facility], [place] [address] [place], [place] [address]    Report Status PENDING  Incomplete  Culture, blood (Routine X 2) w Reflex to ID Panel     Status: None (Preliminary result)   Collection Time: 08/11/19  9:53  AM   Specimen: BLOOD LEFT HAND  Result Value Ref Range Status   Specimen Description BLOOD LEFT HAND  Final   Special Requests   Final    BOTTLES DRAWN AEROBIC AND ANAEROBIC Blood Culture adequate volume   Culture   Final    NO GROWTH 3 DAYS Performed at [facility], [place] [address], [place], [place] [address]    Report Status PENDING  Incomplete         Radiology Studies: No results found.      Scheduled Meds: . amLODipine  10 mg Per Tube Q24H  . aspirin  325 mg Per Tube Daily  . chlorhexidine  15 mL Mouth Rinse BID  . Chlorhexidine Gluconate Cloth  6 each Topical Q0600  . cloNIDine  0.1 mg Per Tube BID  . enoxaparin (LOVENOX) injection  40 mg Subcutaneous Q24H  . insulin aspart  0-9 Units Subcutaneous Q4H  . mouth rinse  15 mL Mouth Rinse q12n4p  . multivitamin  15 mL Per Tube Daily  . QUEtiapine  25 mg Oral QHS  . simvastatin  20 mg Oral q1800   Continuous Infusions: . sodium chloride Stopped (08/08/19 1952)  . feeding supplement (OSMOLITE 1.2 CAL) 1,000 mL (08/13/19 2117)     LOS: 9 days    Time spent: 35 minutes    Dana Allan, MD  [facility] Pager #: [phone] 7PM-7AM contact night coverage as above

## 2019-08-14 NOTE — Progress Notes (Signed)
STROKE TEAM PROGRESS NOTE   HISTORY OF PRESENT ILLNESS (per record) Cindy [name] is an 59 y.o. female  With PMH polysubstance abuse, HTN, HLD, CVA (2005) who presented to [facility] for [facility]/ seizures. Presented to [facility] on 3/19 with reports of AMS and active seizures on arrival to [facility]. She was noted to have eye deviation to the right with twitching of face and unresponsive.  Patient stated she has had seizures and a stroke before. She stated 2 years ago, but does not know why she had a seizure. Endorses drinking 6 pack of beer per day, no liquor. Denies smoking or drugs. UDS+ cocaine. She stated that her husband took her to the hospital for weakness.  Hospital course:  3/19 presented to [facility] for AMS / seizures, UDS + cocaine, Na: 125, ETOH: 21, CTH: no hemorrhage; intubated for air way protection. Treated with Ativan and keppra. Transferred to [facility] 3/21  EEG:  IMPRESSION: This is an abnormal EEG secondary to general background slowing. This finding may be seen with a diffuse disturbance that is etiologically nonspecific, but may include a metabolic encephalopathy or post-ictal state, among other possibilities.The fast beta activity noted likely represents a medication effect.No epileptiform activityis noted. 3/24 MRI: 1. Scattered small foci of restricted diffusion within the bilateral cerebral hemispheres, consistent with small acute infarcts, likely Embolic 3/27: neurology consulted Modified Rankin: Rankin Score=0 NIHSS:6 (dysarthria, weakness,questions)   INTERVAL HISTORY No complaints.  She says she has never had seizures before.  UDS was positive for cocaine. EEG showed generalized slowing.   MRI reviewed personally and shows multiple bilateral frontoparietal acute infarcts, moderate periventricular small vessel ischemic disease, old right cerebellar peduncle and left cerebellar infarcts, and global atrophy.  CDUS and TTE are normal.      OBJECTIVE Vitals:   08/13/19 2118 08/13/19 2311  08/14/19 0332 08/14/19 0343  BP: (!) 148/92 128/83 135/80   Pulse: (!) 108 99 98   Resp:  20 18   Temp:  98.3 F (36.8 C) 98.2 F (36.8 C)   TempSrc:  Oral Oral   SpO2:  99% 97%   Weight:    73.9 kg  Height:        CBC:  Recent Labs  Lab 08/11/19 0301 08/12/19 0240  WBC 6.0 7.4  HGB 11.4* 11.7*  HCT 33.5* 34.5*  MCV 95.4 96.1  PLT 229 277    Basic Metabolic Panel:  Recent Labs  Lab 08/07/19 1720 08/08/19 0335 08/08/19 0915 08/12/19 0240 08/13/19 0519  NA 130*  --    < > 131* 130*  K 3.6  --    < > 4.6 4.5  CL 102  --    < > 98 98  CO2 19*  --    < > 21* 20*  GLUCOSE 122*  --    < > 136* 133*  BUN 9  --    < > 17 13  CREATININE 0.91  --    < > 0.84 0.99  CALCIUM 8.5*  --    < > 8.7* 9.3  MG 2.1 2.0  --   --   --   PHOS 2.9 3.7  --   --   --    < > = values in this interval not displayed.    Lipid Panel:     Component Value Date/Time   CHOL 102 08/11/2019 0301   CHOL 241 (H) 03/26/2015 0921   TRIG 31 08/11/2019 0301   HDL 50 08/11/2019 0301  HDL 40 03/26/2015 0921   CHOLHDL 2.0 08/11/2019 0301   VLDL 6 08/11/2019 0301   LDLCALC 46 08/11/2019 0301   LDLCALC 191 (H) 03/26/2015 0921   HgbA1c:  Lab Results  Component Value Date   HGBA1C 5.1 08/11/2019   Urine Drug Screen:     Component Value Date/Time   LABOPIA NONE DETECTED 08/05/2019 1225   COCAINSCRNUR POSITIVE (A) 08/05/2019 1225   LABBENZ NONE DETECTED 08/05/2019 1225   AMPHETMU NONE DETECTED 08/05/2019 1225   THCU NONE DETECTED 08/05/2019 1225   LABBARB NONE DETECTED 08/05/2019 1225    Alcohol Level     Component Value Date/Time   ETH 21 (H) 08/05/2019 1201    IMAGING  DG Swallowing Func-Speech Pathology  Result Date: 08/12/2019 Objective Swallowing Evaluation: Type of Study: MBS-Modified Barium Swallow Study  Patient Details Name: Cindy [name] MRN: [id number] Date of Birth: 1961/05/18 Today's Date: 08/12/2019 Time: SLP Start Time (ACUTE ONLY): 1315 -SLP Stop Time (ACUTE ONLY): 1330  SLP Time Calculation (min) (ACUTE ONLY): 15 min Past Medical History: Past Medical History: Diagnosis Date . Abnormal LFTs   Hx of . CVA (cerebral vascular accident) (HCC) 2005 . Hyperlipidemia  . Hypertension  . Polysubstance dependence including opioid type drug, episodic abuse (HCC)   cocaine/etoh  Past Surgical History: No past surgical history on file. HPI: 59 y/o F who presented to [facility] 3/19 with reports of altered mental status. Active seizures on arrival to ER.  Hx of polysubstance abuse. Intubated from 3/19-3/22.  No data recorded Assessment / Plan / Recommendation CHL IP CLINICAL IMPRESSIONS 08/12/2019 Clinical Impression MBS completed with pt exhibitng mild oropharyngeal deficits leading to penetration with thin liquids due to incomplete laryngeal closure. Questionable trace amount of barium reaching vocal cords versus higher in vestibule (very faint). Decreased cohesion with lingual residue in right buccal cavity. Swallow was slower to trigger with barium reaching valleculae and once pyriform sinsues before laryngeal protection completed. Recommend initiation of Dys 1, nectar thick liquids, straws allowed, aks pt to clear throat intermittently, full assist for feeding and swallow strategies. ST will continue to treat.     SLP Visit Diagnosis Dysphagia, oropharyngeal phase (R13.12) Attention and concentration deficit following -- Frontal lobe and executive function deficit following -- Impact on safety and function Mild aspiration risk   CHL IP TREATMENT RECOMMENDATION 08/12/2019 Treatment Recommendations Therapy as outlined in treatment plan below   Prognosis 08/12/2019 Prognosis for Safe Diet Advancement Fair Barriers to Reach Goals -- Barriers/Prognosis Comment -- CHL IP DIET RECOMMENDATION 08/12/2019 SLP Diet Recommendations Dysphagia 1 (Puree) solids;Nectar thick liquid Liquid Administration via Cup;No straw;Straw Medication Administration Crushed with puree Compensations Minimize environmental  distractions;Slow rate;Small sips/bites;Clear throat intermittently Postural Changes Seated upright at 90 degrees   CHL IP OTHER RECOMMENDATIONS 08/12/2019 Recommended Consults -- Oral Care Recommendations Oral care BID Other Recommendations --   CHL IP FOLLOW UP RECOMMENDATIONS 08/12/2019 Follow up Recommendations Skilled Nursing facility   [facility] IP FREQUENCY AND DURATION 08/12/2019 Speech Therapy Frequency (ACUTE ONLY) min 2x/week Treatment Duration 2 weeks      CHL IP ORAL PHASE 08/12/2019 Oral Phase Impaired Oral - Pudding Teaspoon -- Oral - Pudding Cup -- Oral - Honey Teaspoon -- Oral - Honey Cup WFL Oral - Nectar Teaspoon -- Oral - Nectar Cup Decreased bolus cohesion;Right anterior bolus loss Oral - Nectar Straw Decreased bolus cohesion Oral - Thin Teaspoon -- Oral - Thin Cup Decreased bolus cohesion;Delayed oral transit Oral - Thin Straw WFL Oral - Puree Delayed oral transit;Lingual/palatal  residue Oral - Mech Soft -- Oral - Regular -- Oral - Multi-Consistency -- Oral - Pill -- Oral Phase - Comment --  CHL IP PHARYNGEAL PHASE 08/12/2019 Pharyngeal Phase Impaired Pharyngeal- Pudding Teaspoon -- Pharyngeal -- Pharyngeal- Pudding Cup -- Pharyngeal -- Pharyngeal- Honey Teaspoon -- Pharyngeal -- Pharyngeal- Honey Cup Delayed swallow initiation-vallecula Pharyngeal -- Pharyngeal- Nectar Teaspoon -- Pharyngeal -- Pharyngeal- Nectar Cup Delayed swallow initiation-vallecula Pharyngeal -- Pharyngeal- Nectar Straw WFL Pharyngeal -- Pharyngeal- Thin Teaspoon -- Pharyngeal -- Pharyngeal- Thin Cup Delayed swallow initiation-pyriform sinuses;Delayed swallow initiation-vallecula Pharyngeal -- Pharyngeal- Thin Straw Penetration/Aspiration during swallow Pharyngeal Material enters airway, CONTACTS cords and then ejected out;Material enters airway, CONTACTS cords and not ejected out Pharyngeal- Puree WFL Pharyngeal -- Pharyngeal- Mechanical Soft -- Pharyngeal -- Pharyngeal- Regular -- Pharyngeal -- Pharyngeal- Multi-consistency --  Pharyngeal -- Pharyngeal- Pill -- Pharyngeal -- Pharyngeal Comment --  CHL IP CERVICAL ESOPHAGEAL PHASE 08/12/2019 Cervical Esophageal Phase WFL Pudding Teaspoon -- Pudding Cup -- Honey Teaspoon -- Honey Cup -- Nectar Teaspoon -- Nectar Cup -- Nectar Straw -- Thin Teaspoon -- Thin Cup -- Thin Straw -- Puree -- Mechanical Soft -- Regular -- Multi-consistency -- Pill -- Cervical Esophageal Comment -- Cindy [name], Cindy [name] 08/12/2019, 3:23 PM              MRI Head WO Contrast 08/10/19 IMPRESSION: 1. Scattered small foci of restricted diffusion within the bilateral cerebral hemispheres, consistent with small acute infarcts, likely embolic. 2. Advanced white matter disease, more pronounced than expected for age. 3. Mild inflammatory paranasal sinus disease and bilateral mastoid effusions.  CT Head WO Contrast 08/10/19 IMPRESSION: 1. No acute intracranial pathology. 2. Extensive periventricular and deep white matter hypodensity markedly increased compared to remote prior CT dated 2006. Findings may reflect small-vessel white matter disease or alternately demyelinating disorder such as multiple sclerosis. Correlate with referable clinical history.  Transthoracic Echocardiogram  08/06/19 IMPRESSIONS  1. Left ventricular ejection fraction, by estimation, is 60 to 65%. The  left ventricle has normal function. The left ventricle has no regional  wall motion abnormalities. There is mild left ventricular hypertrophy.  Left ventricular diastolic parameters  are consistent with Grade I diastolic dysfunction (impaired relaxation).  2. Right ventricular systolic function is normal. The right ventricular  size is normal.  3. The mitral valve is grossly normal. Mild mitral valve regurgitation.  4. The aortic valve is tricuspid. Aortic valve regurgitation is not  visualized.   Bilateral Carotid Dopplers  08/11/19 Summary:  Right Carotid: Velocities in the right ICA are consistent with a 1-39% stenosis.   Left Carotid: Velocities in the left ICA are consistent with a 1-39% stenosis.  Vertebrals: Bilateral vertebral arteries demonstrate antegrade flow.  Subclavians: Normal flow hemodynamics were seen in bilateral subclavian arteries.   ECG - ST rate 107 BPM. (See cardiology reading for complete details)  EEG  08/07/19 IMPRESSION: This is an abnormal EEG secondary to general background slowing.  This finding may be seen with a diffuse disturbance that is etiologically nonspecific, but may include a metabolic encephalopathy or post-ictal state, among other possibilities.  The fast beta activity noted likely represents a medication effect.  No epileptiform activity is noted.     PHYSICAL EXAM Blood pressure 135/80, pulse 98, temperature 98.2 F (36.8 C), temperature source Oral, resp. rate 18, height 5\' 4"  (1.626 m), weight 73.9 kg, SpO2 97 %.   Awake, alert. Oriented to year, month, date, day, place, city, state.   Disoriented to president. Language - fluent Comprehension, naming,  repetition - intact Face symmetrical. Tongue midline Strength 5/5 BUE and BLE Coord- mild dysmetria bilateral Sensory intact    ASSESSMENT/PLAN Cindy [name] is a 59 y.o. female with history of polysubstance abuse, HTN, HLD, CVA (2005) who presented to [facility] for AMS/ seizures and weakness. She did not receive IV t-PA due to late presentation (>4.5 hours from time of onset)   Stroke:   Resultant  Seizure and multiple infarcts  Code Stroke CT Head - not ordered  CT head - 08/10/19- no acute findings.  Extensive periventricular and deep white matter hypodensity markedly increased compared to remote prior CT dated 2006. Findings may reflect small-vessel white matter disease or alternately demyelinating disorder such as multiple sclerosis.   MRI head - Scattered small foci of restricted diffusion within the bilateral cerebral hemispheres, consistent with small acute infarcts, likely embolic.  MRA head -  not ordered  CTA H&N - not ordered  CT Perfusion - not ordered  EEG - This is an abnormal EEG secondary to general background slowing.  This finding may be seen with a diffuse disturbance that is etiologically nonspecific, but may include a metabolic encephalopathy or post-ictal state, among other possibilities.  The fast beta activity noted likely represents a medication effect.  No epileptiform activity is noted.    Carotid Doppler - unremarkable  2D Echo - EF 60 - 65%. No cardiac source of emboli identified.   Sars Corona Virus 2 - negative  LDL - 46  HgbA1c - 5.1  UDS - cocaine  VTE prophylaxis - Lovenox Diet  Diet Order            DIET - DYS 1 Room service appropriate? Yes with Assist; Fluid consistency: Nectar Thick  Diet effective now               No antithrombotic prior to admission, now on aspirin 325 mg daily  Patient will be counseled to be compliant with her antithrombotic medications  Ongoing aggressive stroke risk factor management  Therapy recommendations:  pending  Disposition:  Pending  Hypertension  Home BP meds: Amlodipine  Current BP meds: Amlodipine ; Clonidine  Stable . Permissive hypertension (OK if < 220/120) but gradually normalize in 5-7 days  . Long-term BP goal normotensive  Hyperlipidemia  Home Lipid lowering medication: Zocor 20 mg daily  LDL 46, goal < 70  Current lipid lowering medication: none - resume Zocor  Continue statin at discharge  Other Stroke Risk Factors  Advanced age  ETOH use, advised to drink no more than 1 alcoholic beverage per day.  Overweight, Body mass index is 27.98 kg/m., recommend weight loss, diet and exercise as appropriate   Hx stroke/TIA  Substance Abuse  Other Active Problems  Code status - Full code  Hyponatremia - 130  Anemia - Hb - 11.7  ETOH - 6 pack of beer per day  Hospital day # 9  Impression:  I suspect both cocaine induced generalized seizures and cocaine  induced ischemic infarcts.  The EEG was taken in a post-ictal state.  She does not appear demented.  The possibility of cardioembolism due to a cocaine induced arrhythmia also exists.  If he does IVDA as well, then endocarditis with septic embolism is also possible.  However, she has not been febrile and so I feel less likely.  However, I do recommend a TEE to ensure that is normal.    Continue ASA. Statin, and BP meds.   No antiseizure med necessary since cocaine  induced seizure.    Weston Settle, MS, MD  To contact Stroke Continuity provider, please refer to [email or web address]. After hours, contact General Neurology

## 2019-08-15 ENCOUNTER — Inpatient Hospital Stay (HOSPITAL_COMMUNITY): Payer: Self-pay

## 2019-08-15 DIAGNOSIS — I6340 Cerebral infarction due to embolism of unspecified cerebral artery: Secondary | ICD-10-CM

## 2019-08-15 DIAGNOSIS — I639 Cerebral infarction, unspecified: Secondary | ICD-10-CM

## 2019-08-15 LAB — TYPE AND SCREEN
ABO/RH(D): A POS
Antibody Screen: NEGATIVE

## 2019-08-15 LAB — GLUCOSE, CAPILLARY
Glucose-Capillary: 109 mg/dL — ABNORMAL HIGH (ref 70–99)
Glucose-Capillary: 118 mg/dL — ABNORMAL HIGH (ref 70–99)
Glucose-Capillary: 125 mg/dL — ABNORMAL HIGH (ref 70–99)
Glucose-Capillary: 114 mg/dL — ABNORMAL HIGH (ref 70–99)
Glucose-Capillary: 110 mg/dL — ABNORMAL HIGH (ref 70–99)

## 2019-08-15 LAB — HEMOGLOBIN AND HEMATOCRIT, BLOOD
HCT: 29.6 % — ABNORMAL LOW (ref 36.0–46.0)
Hemoglobin: 10.1 g/dL — ABNORMAL LOW (ref 12.0–15.0)

## 2019-08-15 MED ORDER — PANTOPRAZOLE SODIUM 40 MG IV SOLR: 40.0000 mg | Freq: Two times a day (BID) | INTRAVENOUS | Status: DC

## 2019-08-15 MED ORDER — ASPIRIN EC 81 MG PO TBEC
81.0000 mg | DELAYED_RELEASE_TABLET | Freq: Every day | ORAL | Status: DC
  Administered 2019-08-15: 81 mg via ORAL
  Filled 2019-08-15: qty 1

## 2019-08-15 MED ORDER — SODIUM CHLORIDE 0.9 % IV SOLN
8.0000 mg/h | INTRAVENOUS | Status: DC
  Administered 2019-08-15 – 2019-08-16 (×2): 8 mg/h via INTRAVENOUS
  Filled 2019-08-15 (×2): qty 80

## 2019-08-15 MED ORDER — LEVETIRACETAM ER 500 MG PO TB24
500.0000 mg | ORAL_TABLET | Freq: Every day | ORAL | Status: DC
  Administered 2019-08-15 – 2019-08-19 (×5): 500 mg via ORAL
  Filled 2019-08-15 (×5): qty 1

## 2019-08-15 MED ORDER — OSMOLITE 1.2 CAL PO LIQD: 1000.0000 mL | ORAL | Status: DC

## 2019-08-15 MED ORDER — OSMOLITE 1.2 CAL PO LIQD
1000.0000 mL | ORAL | Status: DC
  Filled 2019-08-15: qty 1000

## 2019-08-15 MED ORDER — SODIUM CHLORIDE 0.9 % IV SOLN
80.0000 mg | Freq: Once | INTRAVENOUS | Status: AC
  Administered 2019-08-15: 80 mg via INTRAVENOUS
  Filled 2019-08-15: qty 80

## 2019-08-15 MED ORDER — SODIUM CHLORIDE 0.9 % IV SOLN
50.0000 ug/h | INTRAVENOUS | Status: DC
  Administered 2019-08-15 – 2019-08-16 (×2): 50 ug/h via INTRAVENOUS
  Filled 2019-08-15 (×2): qty 1

## 2019-08-15 MED ORDER — CLOPIDOGREL BISULFATE 75 MG PO TABS
75.0000 mg | ORAL_TABLET | Freq: Every day | ORAL | Status: DC
  Administered 2019-08-15: 75 mg via ORAL
  Filled 2019-08-15: qty 1

## 2019-08-15 NOTE — Progress Notes (Signed)
PROGRESS NOTE    Cindy [name]  ZHY:[id number] DOB: 25-Apr-1961 DOA: 08/05/2019 PCP: Avon Gully, MD  Outpatient Specialists:   Brief Narrative: As per H&P done by ICU team on 08/05/2019 "59 y/o F who presented to [facility] on 3/19 via EMS with reports of AMS.    The patient was actively seizing on arrival to ER.  She was noted to have eye deviation to the right with twitching of the face / unresponsive. There were concerns of vomiting prior to arrival.  She was treated with IV ativan on arrival and keppra.  The patient was intubated for airway protection. UDS was noted to be cocaine positive.  Salicylate level <7, ETOH 21.  CXR with ETT in appropriate position, no infiltrate. Initial labs - Na 124, K 4, Cl 90, CO2 16, glucose 87, BUN 8, Sr Cr 1.08, AG 18, lipase 69, AST 57, ALT 25, BNP 121, troponin 33, WBC 10.9, Hgb 14.9 and platelets 338.    The patient was transferred to [facility] for further evaluation".   08/14/2019: The hospitalist team assumed care of the patient today.  Current problems include seizure, multiple infarcts with MRI brain revealing acute ischemic changes with embolic pattern, history of polysubstance abuse with UDS revealing cocaine, hypertension and hyperlipidemia.  08/15/2019: Nursing staff reported that patient vomited blood clot later this afternoon/evening.  Tube feed is on hold.  Gastric lavage confirmed likely upper GI bleed.  We will continue to hold tube feeds.  Start patient on Protonix drip.  Follow H/H every 6 hours.  Hold aspirin and Plavix.  GI team, Dr. Vida Rigger, consulted.    Assessment & Plan:   Active Problems:   Status epilepticus (HCC)   Acute encephalopathy   Cerebral embolism with cerebral infarction  Stroke, ischemic with embolic pattern: -MRI brain revealed scattered small foci of restricted diffusion within the bilateral cerebral hemispheres, consistent with small acute infarcts, likely embolic. -UDS was positive for cocaine. -Seizures noted, deemed  likely secondary to cocaine -Patient is currently on aspirin. -EEG revealed: "abnormal EEG secondary to general background slowing. This finding may be seen with a diffuse disturbance that is etiologically nonspecific, but may include a metabolic encephalopathy or post-ictal state, among other possibilities.The fast beta activity noted likely represents a medication effect.No epileptiform activityis noted". -Carotid Doppler - unremarkable -2D Echo - EF 60 - 65%. No cardiac source of emboli identified.  -LDL - 46 -HgbA1c - 5.1 08/15/2019: Neurology team initially on the Plavix today.  However, patient developed upper GI bleed later today.  Aspirin and Plavix will be on hold.  Likely upper GI bleed: -See above documentation. -Hold aspirin and Plavix. -Protonix drip. -Monitor H&H -GI team consulted.  Hypertension:  -Continue current medication. -Blood pressure is at goal.  Hyperlipidemia: LDL is 46. Continue statin.  DVT prophylaxis: Subcu Lovenox Code Status: Full code Family Communication:  Disposition Plan: CIR   Consultants:   Neurology  Procedures:   Echo  Antimicrobials:   None   Subjective: Bloody vomitus reported by the nursing staff  Objective: Vitals:   08/14/19 1530 08/14/19 2337 08/15/19 0432 08/15/19 0848  BP: 124/78 115/80 128/81 134/84  Pulse: 98 (!) 103 (!) 102 98  Resp: 16 18 18 20   Temp: 98.5 F (36.9 C) 98.6 F (37 C) 98.7 F (37.1 C) 98.6 F (37 C)  TempSrc: Oral Oral Oral Oral  SpO2: 97% 98% 98% 98%  Weight:      Height:        Intake/Output Summary (  Last 24 hours) at 08/15/2019 0903 Last data filed at 08/15/2019 0430 Gross per 24 hour  Intake 120 ml  Output 275 ml  Net -155 ml   Filed Weights   08/11/19 0307 08/12/19 0500 08/14/19 0343  Weight: 58.3 kg 58.2 kg 73.9 kg    Examination:  General exam: Appears calm and comfortable.  Facial asymmetry. Respiratory system: Clear to auscultation.  Cardiovascular system: S1  & S2 heard. Gastrointestinal system: Abdomen is nondistended, soft and nontender. No organomegaly or masses felt. Normal bowel sounds heard. Central nervous system: Alert and oriented.  Right-sided weakness Extremities: No leg edema  Data Reviewed: I have personally reviewed following labs and imaging studies  CBC: Recent Labs  Lab 08/09/19 0218 08/10/19 0356 08/11/19 0301 08/12/19 0240  WBC 8.8 8.3 6.0 7.4  HGB 10.6* 12.4 11.4* 11.7*  HCT 31.6* 35.4* 33.5* 34.5*  MCV 96.3 94.1 95.4 96.1  PLT 185 237 229 782   Basic Metabolic Panel: Recent Labs  Lab 08/09/19 0218 08/10/19 0356 08/11/19 0301 08/12/19 0240 08/13/19 0519  NA 132* 131* 132* 131* 130*  K 3.3* 3.3* 4.8 4.6 4.5  CL 101 97* 100 98 98  CO2 23 24 25  21* 20*  GLUCOSE 137* 143* 135* 136* 133*  BUN 12 7 16 17 13   CREATININE 0.94 0.82 0.74 0.84 0.99  CALCIUM 8.4* 8.7* 8.6* 8.7* 9.3   GFR: Estimated Creatinine Clearance: 59.5 mL/min (by C-G formula based on SCr of 0.99 mg/dL). Liver Function Tests: Recent Labs  Lab 08/09/19 1100  AST 29  ALT 27  ALKPHOS 56  BILITOT 0.7  PROT 5.6*  ALBUMIN 2.1*   No results for input(s): LIPASE, AMYLASE in the last 168 hours. Recent Labs  Lab 08/09/19 1100  AMMONIA 23   Coagulation Profile: Recent Labs  Lab 08/09/19 1100  INR 1.1   Cardiac Enzymes: No results for input(s): CKTOTAL, CKMB, CKMBINDEX, TROPONINI in the last 168 hours. BNP (last 3 results) No results for input(s): PROBNP in the last 8760 hours. HbA1C: No results for input(s): HGBA1C in the last 72 hours. CBG: Recent Labs  Lab 08/14/19 1731 08/14/19 2133 08/14/19 2345 08/15/19 0444 08/15/19 0846  GLUCAP 111* 114* 131* 109* 118*   Lipid Profile: No results for input(s): CHOL, HDL, LDLCALC, TRIG, CHOLHDL, LDLDIRECT in the last 72 hours. Thyroid Function Tests: No results for input(s): TSH, T4TOTAL, FREET4, T3FREE, THYROIDAB in the last 72 hours. Anemia Panel: No results for input(s):  VITAMINB12, FOLATE, FERRITIN, TIBC, IRON, RETICCTPCT in the last 72 hours. Urine analysis:    Component Value Date/Time   COLORURINE AMBER (A) 08/05/2019 2229   APPEARANCEUR HAZY (A) 08/05/2019 2229   LABSPEC 1.018 08/05/2019 2229   PHURINE 6.0 08/05/2019 2229   GLUCOSEU 50 (A) 08/05/2019 2229   HGBUR MODERATE (A) 08/05/2019 2229   BILIRUBINUR NEGATIVE 08/05/2019 2229   KETONESUR 20 (A) 08/05/2019 2229   PROTEINUR 100 (A) 08/05/2019 2229   NITRITE NEGATIVE 08/05/2019 2229   LEUKOCYTESUR SMALL (A) 08/05/2019 2229   Sepsis Labs: @LABRCNTIP (procalcitonin:4,lacticidven:4)  ) Recent Results (from the past 240 hour(s))  Respiratory Panel by RT PCR (Flu A&B, Covid) - Nasopharyngeal Swab     Status: None   Collection Time: 08/05/19 12:00 PM   Specimen: Nasopharyngeal Swab  Result Value Ref Range Status   SARS Coronavirus 2 by RT PCR NEGATIVE NEGATIVE Final    Comment: (NOTE) SARS-CoV-2 target nucleic acids are NOT DETECTED. The SARS-CoV-2 RNA is generally detectable in upper respiratoy specimens during the acute  phase of infection. The lowest concentration of SARS-CoV-2 viral copies this assay can detect is 131 copies/mL. A negative result does not preclude SARS-Cov-2 infection and should not be used as the sole basis for treatment or other patient management decisions. A negative result may occur with  improper specimen collection/handling, submission of specimen other than nasopharyngeal swab, presence of viral mutation(s) within the areas targeted by this assay, and inadequate number of viral copies (<131 copies/mL). A negative result must be combined with clinical observations, patient history, and epidemiological information. The expected result is Negative. Fact Sheet for Patients:  https://www.moore.com/ Fact Sheet for Healthcare Providers:  https://www.young.biz/ This test is not yet ap proved or cleared by the [place] FDA and    has been authorized for detection and/or diagnosis of SARS-CoV-2 by FDA under an Emergency Use Authorization (EUA). This EUA will remain  in effect (meaning this test can be used) for the duration of the COVID-19 declaration under Section 564(b)(1) of the Act, 21 U.S.C. section 360bbb-3(b)(1), unless the authorization is terminated or revoked sooner.    Influenza A by PCR NEGATIVE NEGATIVE Final   Influenza B by PCR NEGATIVE NEGATIVE Final    Comment: (NOTE) The Xpert Xpress SARS-CoV-2/FLU/RSV assay is intended as an aid in  the diagnosis of influenza from Nasopharyngeal swab specimens and  should not be used as a sole basis for treatment. Nasal washings and  aspirates are unacceptable for Xpert Xpress SARS-CoV-2/FLU/RSV  testing. Fact Sheet for Patients: https://www.moore.com/ Fact Sheet for Healthcare Providers: https://www.young.biz/ This test is not yet approved or cleared by the [place] FDA and  has been authorized for detection and/or diagnosis of SARS-CoV-2 by  FDA under an Emergency Use Authorization (EUA). This EUA will remain  in effect (meaning this test can be used) for the duration of the  Covid-19 declaration under Section 564(b)(1) of the Act, 21  U.S.C. section 360bbb-3(b)(1), unless the authorization is  terminated or revoked. Performed at [facility], [address] [place], [place] [address]   MRSA PCR Screening     Status: None   Collection Time: 08/05/19 10:02 PM   Specimen: Nasal Mucosa; Nasopharyngeal  Result Value Ref Range Status   MRSA by PCR NEGATIVE NEGATIVE Final    Comment:        The GeneXpert MRSA Assay (FDA approved for NASAL specimens only), is one component of a comprehensive MRSA colonization surveillance program. It is not intended to diagnose MRSA infection nor to guide or monitor treatment for MRSA infections. Performed at [facility], [address] [place], [place] [address]    Culture, respiratory (tracheal aspirate)     Status: None   Collection Time: 08/05/19 11:17 PM   Specimen: Tracheal Aspirate; Respiratory  Result Value Ref Range Status   Specimen Description TRACHEAL ASPIRATE  Final   Special Requests NONE  Final   Gram Stain   Final    FEW WBC PRESENT, PREDOMINANTLY PMN FEW GRAM POSITIVE COCCI    Culture   Final    Consistent with normal respiratory flora. Performed at [facility], [address], [place], [place] [address]    Report Status 08/08/2019 FINAL  Final  Culture, blood (Routine X 2) w Reflex to ID Panel     Status: None   Collection Time: 08/06/19 12:51 AM   Specimen: BLOOD LEFT HAND  Result Value Ref Range Status   Specimen Description BLOOD LEFT HAND  Final   Special Requests   Final  AEROBIC BOTTLE ONLY Blood Culture results may not be optimal due to an inadequate volume of blood received in culture bottles   Culture   Final    NO GROWTH 5 DAYS Performed at [facility], [address], [place], [place] [address]    Report Status 08/11/2019 FINAL  Final  Culture, blood (Routine X 2) w Reflex to ID Panel     Status: None   Collection Time: 08/06/19 12:55 AM   Specimen: BLOOD RIGHT HAND  Result Value Ref Range Status   Specimen Description BLOOD RIGHT HAND  Final   Special Requests   Final    AEROBIC BOTTLE ONLY Blood Culture results may not be optimal due to an inadequate volume of blood received in culture bottles   Culture   Final    NO GROWTH 5 DAYS Performed at [facility], [address] [place], [place] [address]    Report Status 08/11/2019 FINAL  Final  Culture, blood (Routine X 2) w Reflex to ID Panel     Status: None (Preliminary result)   Collection Time: 08/11/19  9:40 AM   Specimen: BLOOD  Result Value Ref Range Status   Specimen Description BLOOD LEFT ANTECUBITAL  Final   Special Requests   Final    BOTTLES DRAWN AEROBIC ONLY Blood Culture results may not be optimal due to an inadequate  volume of blood received in culture bottles   Culture NO GROWTH 4 DAYS  Final   Report Status PENDING  Incomplete  Culture, blood (Routine X 2) w Reflex to ID Panel     Status: None (Preliminary result)   Collection Time: 08/11/19  9:53 AM   Specimen: BLOOD LEFT HAND  Result Value Ref Range Status   Specimen Description BLOOD LEFT HAND  Final   Special Requests   Final    BOTTLES DRAWN AEROBIC AND ANAEROBIC Blood Culture adequate volume   Culture NO GROWTH 4 DAYS  Final   Report Status PENDING  Incomplete         Radiology Studies: No results found.      Scheduled Meds: . amLODipine  10 mg Per Tube Q24H  . aspirin  325 mg Per Tube Daily  . chlorhexidine  15 mL Mouth Rinse BID  . Chlorhexidine Gluconate Cloth  6 each Topical Q0600  . cloNIDine  0.1 mg Per Tube BID  . enoxaparin (LOVENOX) injection  40 mg Subcutaneous Q24H  . insulin aspart  0-9 Units Subcutaneous Q4H  . mouth rinse  15 mL Mouth Rinse q12n4p  . multivitamin  15 mL Per Tube Daily  . QUEtiapine  25 mg Oral QHS  . simvastatin  20 mg Oral q1800   Continuous Infusions: . sodium chloride Stopped (08/08/19 1952)  . feeding supplement (OSMOLITE 1.2 CAL) 1,000 mL (08/15/19 0837)     LOS: 10 days    Time spent: 35 minutes    Berton Mount, MD  [facility] Pager #: [phone] 7PM-7AM contact night coverage as above

## 2019-08-15 NOTE — Consult Note (Signed)
Physical Medicine and Rehabilitation Consult Reason for Consult: Decreased functional mobility Referring Physician: [facility]   HPI: Cindy [name] is a 59 y.o. right-handed female with history of polysubstance abuse, hypertension, CVA 2005, hyperlipidemia.  Per chart review patient lives with spouse.  Independent prior to admission.  1 level home with one-step to entry.  Presented 08/05/2019 to [facility] hospital with altered mental status and actively seizing.  She was noted to have eye deviation to the right with twitching of the face.  She was treated with IV Ativan and loaded with Keppra.  Alcohol level 11, BNP 121, urine drug screen positive cocaine, lactic acid 2.7, urinalysis negative nitrite, sodium 124, creatinine 1.05.  Patient did require intubation for airway protection.  Cranial CT scan negative for acute changes.  Extensive periventricular and deep white matter hypodensity markedly increased compared to remote prior CT dated 2006.  Patient was transferred to [facility] for further evaluation.  Echocardiogram with ejection fraction of 67% grade 1 diastolic dysfunction.  EEG negative for seizure.  Carotid Dopplers negative for ICA stenosis.  Follow-up MRI showed scattered small foci of restricted diffusion within the bilateral cerebral hemispheres consistent with small acute infarcts likely embolic.  Currently maintained on aspirin for CVA prophylaxis.  Subcutaneous Lovenox for DVT prophylaxis.  Dysphagia #1 nectar thick liquids.  Therapy evaluations completed with recommendations of physical medicine rehab consult.  No issues overnight, was out of bed today with one-person assistance.  She is drinking thickened liquid without signs of coughing   Review of Systems  Constitutional: Negative for chills and fever.  HENT: Negative for hearing loss.   Eyes: Negative for blurred vision and double vision.  Respiratory: Negative for cough and shortness of breath.   Cardiovascular:  Negative for chest pain, palpitations and leg swelling.  Gastrointestinal: Positive for constipation. Negative for heartburn, nausea and vomiting.  Genitourinary: Negative for dysuria, flank pain and hematuria.  Musculoskeletal: Positive for myalgias.  Skin: Negative for rash.  Neurological: Positive for seizures, weakness and headaches.  All other systems reviewed and are negative.  Past Medical History:  Diagnosis Date  . Abnormal LFTs    Hx of  . CVA (cerebral vascular accident) ([place]) 2005  . Hyperlipidemia   . Hypertension   . Polysubstance dependence including opioid type drug, episodic abuse ([place])    cocaine/etoh    History reviewed. No pertinent surgical history. Family History  Problem Relation Age of Onset  . Cancer Father        prostate   . Hypertension Mother   . Asthma Mother   . Bronchitis Mother    Social History:  reports that she has never smoked. She has never used smokeless tobacco. She reports current alcohol use. She reports that she does not use drugs. Allergies: No Known Allergies Medications Prior to Admission  Medication Sig Dispense Refill  . acetaminophen (TYLENOL) 500 MG tablet Take 1,000 mg by mouth every 6 (six) hours as needed for mild pain or headache.    Marland Kitchen amLODipine (NORVASC) 10 MG tablet TAKE 1 TABLET BY MOUTH EVERY DAY (NEEDS TO MAKE AN APPOINTMENT) (Patient not taking: Reported on 08/07/2019) 30 tablet 0  . simvastatin (ZOCOR) 20 MG tablet Take 1 tablet (20 mg total) by mouth daily. (Patient not taking: Reported on 08/07/2019) 30 tablet 3    Home: [place] expects to be discharged to:: Private residence Living Arrangements: Spouse/significant other Available Help at Discharge: Family, Available 24 hours/day Type of  Home: House Home Access: Stairs to enter Secretary/administrator of Steps: 1 Home Layout: One level Bathroom Shower/Tub: Engineer, manufacturing systems: Standard Home Equipment: Cane - single point, Careers adviser Comments: unsure of accuracy of home setup as pt with impaired cognition today, very soft spoken  Functional History: Prior Function Level of Independence: Independent Functional Status:  Mobility: Bed Mobility Overal bed mobility: Needs Assistance Bed Mobility: Supine to Sit, Sit to Supine Supine to sit: Mod assist, HOB elevated, +2 for physical assistance Sit to supine: Mod assist, +2 for physical assistance, HOB elevated General bed mobility comments: Mod assist to come to sitting EOB assisting most at trunk and hips.  Mod assist to help pt position back to supine assist at bil legs and trunk.  Transfers Overall transfer level: Needs assistance Equipment used: 2 person hand held assist Transfers: Sit to/from Stand Sit to Stand: +2 safety/equipment, Mod assist General transfer comment: Pt with blocked bil feet, posterior hips and extended knees.  She had difficutly getting her feet under her COG.  Pt when attempting to step back bil knees buckled in stance Ambulation/Gait General Gait Details: unable, but we (PT/OT) think she would do well with stedy standing frame.      ADL: ADL Overall ADL's : Needs assistance/impaired General ADL Comments: continues to need total to max assist  Cognition: Cognition Overall Cognitive Status: Impaired/Different from baseline Orientation Level: Oriented X4 Cognition Arousal/Alertness: Awake/alert Behavior During Therapy: WFL for tasks assessed/performed Overall Cognitive Status: Impaired/Different from baseline Area of Impairment: Orientation, Safety/judgement, Following commands, Awareness, Problem solving, Memory, Attention Orientation Level: Disoriented to, Time, Situation, Place Current Attention Level: Sustained Memory: Decreased short-term memory Following Commands: Follows one step commands consistently Safety/Judgement: Decreased awareness of safety, Decreased awareness of deficits Awareness: Intellectual Problem  Solving: Difficulty sequencing, Slow processing, Requires verbal cues, Requires tactile cues General Comments: pt pulling at lines despite repeated cues.   Blood pressure 128/81, pulse (!) 102, temperature 98.7 F (37.1 C), temperature source Oral, resp. rate 18, height 5\' 4"  (1.626 m), weight 73.9 kg, SpO2 98 %. Physical Exam  Neurological:  Patient is alert no acute distress.  Follows commands.  Oriented to self some delay in processing for a year and date.  Follows simple commands.    General: No acute distress Mood and affect are appropriate Heart: Regular rate and rhythm no rubs murmurs or extra sounds Lungs: Clear to auscultation, breathing unlabored, no rales or wheezes Abdomen: Positive bowel sounds, soft nontender to palpation, nondistended Extremities: No clubbing, cyanosis, or edema Skin: No evidence of breakdown, no evidence of rash Neurologic: Cranial nerves II through XII intact, motor strength is 5/5 in left deltoid, bicep, tricep, grip, hip flexor, knee extensors, ankle dorsiflexor and plantar flexor 3/5 in the right deltoid bicep tricep finger flexors and extensors, 4+/5 in the right hip flexor knee extensor ankle dorsiflexor Sensory exam normal sensation to light touch  in bilateral upper and lower extremities Cerebellar exam ataxia right finger-nose-finger intact on the left side musculoskeletal: Full range of motion in all 4 extremities. No joint swelling Results for orders placed or performed during the hospital encounter of 08/05/19 (from the past 24 hour(s))  Glucose, capillary     Status: Abnormal   Collection Time: 08/14/19  7:40 AM  Result Value Ref Range   Glucose-Capillary 134 (H) 70 - 99 mg/dL  Glucose, capillary     Status: Abnormal   Collection Time: 08/14/19 10:59 AM  Result Value Ref Range   Glucose-Capillary  104 (H) 70 - 99 mg/dL  Glucose, capillary     Status: Abnormal   Collection Time: 08/14/19  5:31 PM  Result Value Ref Range   Glucose-Capillary  111 (H) 70 - 99 mg/dL  Glucose, capillary     Status: Abnormal   Collection Time: 08/14/19  9:33 PM  Result Value Ref Range   Glucose-Capillary 114 (H) 70 - 99 mg/dL  Glucose, capillary     Status: Abnormal   Collection Time: 08/14/19 11:45 PM  Result Value Ref Range   Glucose-Capillary 131 (H) 70 - 99 mg/dL  Glucose, capillary     Status: Abnormal   Collection Time: 08/15/19  4:44 AM  Result Value Ref Range   Glucose-Capillary 109 (H) 70 - 99 mg/dL   No results found.   Assessment/Plan: Diagnosis: Bilateral cerebral convexity infarcts frontal region with right upper extremity weakness, decreased balance and dysphagia 1. Does the need for close, 24 hr/day medical supervision in concert with the patient's rehab needs make it unreasonable for this patient to be served in a less intensive setting? Yes 2. Co-Morbidities requiring supervision/potential complications: History of substance abuse history of hypertension 3. Due to bladder management, bowel management, safety, skin/wound care, disease management, medication administration, pain management and patient education, does the patient require 24 hr/day rehab nursing? Yes 4. Does the patient require coordinated care of a physician, rehab nurse, therapy disciplines of PT, OT, speech to address physical and functional deficits in the context of the above medical diagnosis(es)? Yes Addressing deficits in the following areas: balance, endurance, locomotion, strength, transferring, bowel/bladder control, bathing, dressing, feeding, grooming, toileting, swallowing and psychosocial support 5. Can the patient actively participate in an intensive therapy program of at least 3 hrs of therapy per day at least 5 days per week? Yes 6. The potential for patient to make measurable gains while on inpatient rehab is excellent 7. Anticipated functional outcomes upon discharge from inpatient rehab are supervision  with PT, supervision with OT, supervision with  SLP. 8. Estimated rehab length of stay to reach the above functional goals is: 7 to 10 days 9. Anticipated discharge destination: Home 10. Overall Rehab/Functional Prognosis: excellent  RECOMMENDATIONS: This patient's condition is appropriate for continued rehabilitative care in the following setting: CIR Patient has agreed to participate in recommended program. Yes Note that insurance prior authorization may be required for reimbursement for recommended care.  Comment: We will need neuropsych for substance abuse counseling  Charlton Amor, PA-C 08/15/2019  "I have personally performed a face to face diagnostic evaluation of this patient.  Additionally, I have reviewed and concur with the physician assistant's documentation above." Erick Colace M.D. [facility] FAAPM&R (Neuromuscular Med) Diplomate Am Board of Electrodiagnostic Med Fellow Am Board of Interventional Pain

## 2019-08-15 NOTE — Progress Notes (Addendum)
STROKE TEAM PROGRESS NOTE    )   INTERVAL HISTORY I have personally reviewed history of presenting illness with the patient as well as with the husband who was present at the bedside and reviewed imaging films in PACS and electronic medical records.  Patient admits to drug abuse which likely contributed to her seizure and stroke at presentation.  Carotid ultrasound and transthoracic echo are both unremarkable.  Lower extremity venous Dopplers negative for DVT.  Transcranial Doppler studies pending.    OBJECTIVE Vitals:   08/14/19 2337 08/15/19 0432 08/15/19 0848 08/15/19 1215  BP: 115/80 128/81 134/84 122/76  Pulse: (!) 103 (!) 102 98 97  Resp: 18 18 20 18   Temp: 98.6 F (37 C) 98.7 F (37.1 C) 98.6 F (37 C) 98.2 F (36.8 C)  TempSrc: Oral Oral Oral Oral  SpO2: 98% 98% 98% 100%  Weight:      Height:        CBC:  Recent Labs  Lab 08/11/19 0301 08/12/19 0240  WBC 6.0 7.4  HGB 11.4* 11.7*  HCT 33.5* 34.5*  MCV 95.4 96.1  PLT 229 277    Basic Metabolic Panel:  Recent Labs  Lab 08/12/19 0240 08/13/19 0519  NA 131* 130*  K 4.6 4.5  CL 98 98  CO2 21* 20*  GLUCOSE 136* 133*  BUN 17 13  CREATININE 0.84 0.99  CALCIUM 8.7* 9.3    Lipid Panel:     Component Value Date/Time   CHOL 102 08/11/2019 0301   CHOL 241 (H) 03/26/2015 0921   TRIG 31 08/11/2019 0301   HDL 50 08/11/2019 0301   HDL 40 03/26/2015 0921   CHOLHDL 2.0 08/11/2019 0301   VLDL 6 08/11/2019 0301   LDLCALC 46 08/11/2019 0301   LDLCALC 191 (H) 03/26/2015 0921   HgbA1c:  Lab Results  Component Value Date   HGBA1C 5.1 08/11/2019   Urine Drug Screen:     Component Value Date/Time   LABOPIA NONE DETECTED 08/05/2019 1225   COCAINSCRNUR POSITIVE (A) 08/05/2019 1225   LABBENZ NONE DETECTED 08/05/2019 1225   AMPHETMU NONE DETECTED 08/05/2019 1225   THCU NONE DETECTED 08/05/2019 1225   LABBARB NONE DETECTED 08/05/2019 1225    Alcohol Level     Component Value Date/Time   ETH 21 (H)  08/05/2019 1201    IMAGING  VAS 08/07/2019 TRANSCRANIAL DOPPLER  Result Date: 08/15/2019  Transcranial Doppler Indications: Stroke. Limitations for diagnostic windows: Unable to insonate right transtemporal window. Unable to insonate left transtemporal window. Comparison Study: No prior studies. Performing Technologist: 08/17/2019 RVT  Examination Guidelines: A complete evaluation includes B-mode imaging, spectral Doppler, color Doppler, and power Doppler as needed of all accessible portions of each vessel. Bilateral testing is considered an integral part of a complete examination. Limited examinations for reoccurring indications may be performed as noted.  +----------+-------------+----------+-----------+-------+ RIGHT TCD Right VM (cm)Depth (cm)PulsatilityComment +----------+-------------+----------+-----------+-------+ Opthalmic     23.00                 1.55            +----------+-------------+----------+-----------+-------+ ICA siphon    21.00                 1.48            +----------+-------------+----------+-----------+-------+ Vertebral    -22.00                 1.41            +----------+-------------+----------+-----------+-------+  Distal ICA    64.00                 0.72            +----------+-------------+----------+-----------+-------+  +----------+------------+----------+-----------+-------+ LEFT TCD  Left VM (cm)Depth (cm)PulsatilityComment +----------+------------+----------+-----------+-------+ Opthalmic    25.00                 1.51            +----------+------------+----------+-----------+-------+ ICA siphon   20.00                 1.46            +----------+------------+----------+-----------+-------+ Vertebral    -12.00                1.36            +----------+------------+----------+-----------+-------+ Distal ICA   19.00                 0.77            +----------+------------+----------+-----------+-------+   +------------+-------+-------+             VM cm/sComment +------------+-------+-------+ Prox Basilar-25.00  1.12   +------------+-------+-------+ Dist Basilar-21.00  1.18   +------------+-------+-------+    Preliminary    MRI Head WO Contrast 08/10/19 IMPRESSION: 1. Scattered small foci of restricted diffusion within the bilateral cerebral hemispheres, consistent with small acute infarcts, likely embolic. 2. Advanced white matter disease, more pronounced than expected for age. 3. Mild inflammatory paranasal sinus disease and bilateral mastoid effusions.  CT Head WO Contrast 08/10/19 IMPRESSION: 1. No acute intracranial pathology. 2. Extensive periventricular and deep white matter hypodensity markedly increased compared to remote prior CT dated 2006. Findings may reflect small-vessel white matter disease or alternately demyelinating disorder such as multiple sclerosis. Correlate with referable clinical history.  Transthoracic Echocardiogram  08/06/19 IMPRESSIONS  1. Left ventricular ejection fraction, by estimation, is 60 to 65%. The  left ventricle has normal function. The left ventricle has no regional  wall motion abnormalities. There is mild left ventricular hypertrophy.  Left ventricular diastolic parameters  are consistent with Grade I diastolic dysfunction (impaired relaxation).  2. Right ventricular systolic function is normal. The right ventricular  size is normal.  3. The mitral valve is grossly normal. Mild mitral valve regurgitation.  4. The aortic valve is tricuspid. Aortic valve regurgitation is not  visualized.   Bilateral Carotid Dopplers  08/11/19 Summary:  Right Carotid: Velocities in the right ICA are consistent with a 1-39% stenosis.  Left Carotid: Velocities in the left ICA are consistent with a 1-39% stenosis.  Vertebrals: Bilateral vertebral arteries demonstrate antegrade flow.  Subclavians: Normal flow hemodynamics were seen in bilateral  subclavian arteries.   ECG - ST rate 107 BPM. (See cardiology reading for complete details)  EEG  08/07/19 IMPRESSION: This is an abnormal EEG secondary to general background slowing.  This finding may be seen with a diffuse disturbance that is etiologically nonspecific, but may include a metabolic encephalopathy or post-ictal state, among other possibilities.  The fast beta activity noted likely represents a medication effect.  No epileptiform activity is noted.     PHYSICAL EXAM Blood pressure 122/76, pulse 97, temperature 98.2 F (36.8 C), temperature source Oral, resp. rate 18, height 5\' 4"  (1.626 m), weight 73.9 kg, SpO2 100 %.  Pleasant middle-aged Caucasian lady not in distress  Afebrile. Head is nontraumatic. Neck is supple without bruit.    Cardiac exam no murmur or gallop. Lungs  are clear to auscultation. Distal pulses are well felt. Neurological Exam : Awake  Alert oriented x 3.  Slightly dysarthric speech.  No aphasia.  Eye movements full without nystagmus.fundi were not visualized. Vision acuity and fields appear normal. Hearing is normal. Palatal movements are normal. Face symmetric. Tongue midline. Normal strength, tone, reflexes and coordination. Normal sensation. Gait deferred.  ASSESSMENT/PLAN Ms. Cindy [name] is a 59 y.o. female with history of polysubstance abuse, HTN, HLD, CVA (2005) who presented to [facility] for AMS/ seizures and weakness. She did not receive IV t-PA due to late presentation (>4.5 hours from time of onset)   Stroke:   Resultant  Seizure and multiple infarcts  Code Stroke CT Head - not ordered  CT head - 08/10/19- no acute findings.  Extensive periventricular and deep white matter hypodensity markedly increased compared to remote prior CT dated 2006. Findings may reflect small-vessel white matter disease or alternately demyelinating disorder such as multiple sclerosis.   MRI head - Scattered small foci of restricted diffusion within the bilateral cerebral  hemispheres, consistent with small acute infarcts, likely embolic.  MRA head - not ordered  CTA H&N - not ordered  CT Perfusion - not ordered  EEG - This is an abnormal EEG secondary to general background slowing.  This finding may be seen with a diffuse disturbance that is etiologically nonspecific, but may include a metabolic encephalopathy or post-ictal state, among other possibilities.  The fast beta activity noted likely represents a medication effect.  No epileptiform activity is noted.    Carotid Doppler - unremarkable  2D Echo - EF 60 - 65%. No cardiac source of emboli identified.   Sars Corona Virus 2 - negative  LDL - 46  HgbA1c - 5.1  UDS - cocaine  VTE prophylaxis - Lovenox Diet  Diet Order            DIET - DYS 1 Room service appropriate? Yes with Assist; Fluid consistency: Nectar Thick  Diet effective now              No antithrombotic prior to admission, now on aspirin 35m g daily and Plavix 75 mg daily into 3 weeks followed by aspirin alone  Patient will be counseled to be compliant with her antithrombotic medications  Ongoing aggressive stroke risk factor management  Therapy recommendations:  pending  Disposition:  Pending  Hypertension  Home BP meds: Amlodipine  Current BP meds: Amlodipine ; Clonidine  Stable . Permissive hypertension (OK if < 220/120) but gradually normalize in 5-7 days  . Long-term BP goal normotensive  Hyperlipidemia  Home Lipid lowering medication: Zocor 20 mg daily  LDL 46, goal < 70  Current lipid lowering medication: none - resume Zocor  Continue statin at discharge  Other Stroke Risk Factors  Advanced age  ETOH use, advised to drink no more than 1 alcoholic beverage per day.  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  Recommend Keppra for seizure prophylaxis and aspirin and Plavix for 3 weeks followed by aspirin alone and aggressive risk factor modification.  Discontinue transesophageal echocardiogram as suspicion for endocarditis is low given absence  of IV drug abuse or fever.  Check transcranial Doppler studies.  Patient counseled to change her lifestyle and is agreeable.  Discussed with patient and husband and answered questions.  Greater than 50% time during this 25-minute visit was spent on counseling and coordination of care and answering questions about her stroke Antony Contras, MD To contact Stroke Continuity provider, please refer to [email or web address] After hours, contact General Neurology

## 2019-08-15 NOTE — Progress Notes (Addendum)
Inpatient Rehabilitation Admissions Coordinator  I met at bedside with patient and her spouse. We discussed goals and expectations of a possible inpt rehab admit. They are in agreement. I will contact financial counselor to discuss possible disability and Medicaid applications.. Noted possible TEE planned and spouse is aware. I will follow up with admit once medical workup is compete.  Danne Baxter, RN, MSN Rehab Admissions Coordinator [phone] 08/15/2019 12:09 PM

## 2019-08-15 NOTE — Progress Notes (Addendum)
Nutrition Follow-up  DOCUMENTATION CODES:   Not applicable  INTERVENTION:  Calorie Count per MD  Transition to nocturnal feeding: Osmolite 1.2 cal @ 35ml/hr from 1800-0800 (6p-8a)  This will provide 1008 kcal, 46 grams protein, free water  Vital Cuisine Shake po TID, each supplement provides 500 kcal and 22 grams of protein    Would not recommend d/c Cortrak until patient can meet >75% estimated needs po   NUTRITION DIAGNOSIS:   Increased nutrient needs related to acute illness as evidenced by estimated needs.   GOAL:   Patient will meet greater than or equal to 90% of their needs  Progressing.  MONITOR:   PO intake, Diet advancement, Supplement acceptance, Labs  REASON FOR ASSESSMENT:   Malnutrition Screening Tool    ASSESSMENT:   Pt with a PMH significant for CVA, HTN, HLD, and polysubstance abuse presented to the ED with acute metabolic encephalopathy and actively seizing  3/22 - pt self-extubated 3/26 - MBS, DYS1/Nectar  Discussed pt with RN.   RD consulted for calorie count. Would not recommend pulling out Cortrak until pt can consistently meet >75% estimated calorie/protein needs.  Pt endorses poor appetite. RD will transition pt to nocturnal feeding in hopes of stimulating appetite during the day.   PO Intake: 0-50% x 4 recorded meals  Medications reviewed and include: SSI, MVI, Osmolite 1.2 cal @ 63ml/hr  Labs reviewed: Na 130 (L) CBGs 109-131  UOP: x24 hours I/O: +12,149ml since admit  Diet Order:   Diet Order            DIET - DYS 1 Room service appropriate? Yes with Assist; Fluid consistency: Nectar Thick  Diet effective now              EDUCATION NEEDS:   Not appropriate for education at this time  Skin:  Skin Assessment: Reviewed RN Assessment  Last BM:  08/13/19  Height:   Ht Readings from Last 1 Encounters:  08/05/19 5\' 4"  (1.626 m)    Weight:   Wt Readings from Last 1 Encounters:  08/14/19 73.9 kg     BMI:  Body mass index is 27.98 kg/m.  Estimated Nutritional Needs:   Kcal:  1550-1750  Protein:  75-85 gm  Fluid:  >/= 1.5 L   08/16/19, MS, RD, LDN RD pager number and weekend/on-call pager number located in [place].

## 2019-08-15 NOTE — Progress Notes (Signed)
Pt eating dinner with HOB elevated above 45 degree angle and nurse noticed pt coughing throughout. Tray removed from patient. Pt started to call out stating that she was throwing up. Once nurse entered pt room she noticed what appeared to look like blood on pt gown and blanket. Blood clots noted to be on blanket as well. MD notified of pt change in condition and awaiting written orders to proceed with care. Pt order for tube feed to start and 1800, held and MD made aware.

## 2019-08-15 NOTE — Progress Notes (Signed)
Occupational Therapy Treatment Patient Details Name: Cindy [name] MRN: [id number] DOB: [birth year] Today's Date: 08/15/2019    History of present illness 59 y/o F who presented to [facility] 3/19 with reports of altered mental status. Active seizures on arrival to ER.  Hx of polysubstance abuse. Intubated from 3/19-3/22.  Other PMH includes CVA and HTN.    OT comments  Pt making progress in therapy, demonstrating improved sitting balance and independence with transfer tasks. Pt tolerated sitting EOB 15 min with variable min guard to mod assist. Pt engaged in right and left lateral leaning task, able to return to mindline with min guard. Pt completed x 5 each side. Pt engaged in reaching task, able to cross body with BUEs and maintain balance. Noticed that when pt was distracted she required mod assist to maintain sitting balance as compared to when she was focused on the task. Pt transferred to bedside chair with sara stedy. Noted to be incontinent of urine with pt requiring max assist to complete peri care in standing. Engaged in simple grooming and self-feeding tasks while seated in chair. OT will continue to follow acutely. Continue to recommend CIR for additional rehab prior to discharge home.     Follow Up Recommendations  CIR;Supervision/Assistance - 24 hour    Equipment Recommendations  Other (comment)(TBD)    Recommendations for Other Services      Precautions / Restrictions Precautions Precautions: Fall Restrictions Weight Bearing Restrictions: No       Mobility Bed Mobility Overal bed mobility: Needs Assistance Bed Mobility: Supine to Sit;Sit to Supine     Supine to sit: HOB elevated;Min assist     General bed mobility comments: Use of bed rail, good initiation. Assist for trunk control upon sitting upright.   Transfers Overall transfer level: Needs assistance Equipment used: Ambulation equipment used Transfers: Sit to/from [facility] Sit to Stand: +2  safety/equipment;Mod assist;Min assist Stand pivot transfers: Total assist       General transfer comment: Min assist x 2 for sit/stand from EOB. Mod assist x 2 for sit/stand from bedside chair with hand held assist. Used stedy to transfer to bedside chair.     Balance Overall balance assessment: Needs assistance Sitting-balance support: Feet supported Sitting balance-Leahy Scale: Poor Sitting balance - Comments: Pt tolerated sitting EOB 15 min. Pt engaged in right and left lateral leaning task, able to return to mindline with min guard. Pt completed x 5 each side. Pt engaged in reaching task able to cross body and maintain balance. Noticed that when pt was distracted she required mod assist to maintain sitting balance as compared to when she was focused on the task. Postural control: Right lateral lean;Posterior lean Standing balance support: Bilateral upper extremity supported Standing balance-Leahy Scale: Poor Standing balance comment: Mod assist x 2                           ADL either performed or assessed with clinical judgement   ADL Overall ADL's : Needs assistance/impaired Eating/Feeding: Set up;Supervision/ safety;Sitting Eating/Feeding Details (indicate cue type and reason): Pt able to grasp and manipulate utensils with right hand. Noted 0 drops throughout. Pt able to reach, grasp, and bring drink to mouth.  Grooming: Wash/dry hands;Wash/dry face;Set up;Supervision/safety;Sitting                       Toileting- Clothing Manipulation and Hygiene: Maximal assistance;Sit to/from stand Toileting - Water quality scientist Details (indicate  cue type and reason): Assist for peri care and clothing management in standing.      Functional mobility during ADLs: (Utilized sara stedy for transfer.) General ADL Comments: Pt tolerated sitting EOB 15+ min with variable min guard to mod assist to maintain balance. Pt engaged in self-care tasks while seated in bedside  chair.      Vision       Perception     Praxis      Cognition Arousal/Alertness: Awake/alert Behavior During Therapy: WFL for tasks assessed/performed Overall Cognitive Status: Impaired/Different from baseline Area of Impairment: Orientation;Safety/judgement;Following commands;Awareness;Problem solving;Memory;Attention                 Orientation Level: Disoriented to;Time;Situation Current Attention Level: Sustained Memory: Decreased short-term memory Following Commands: Follows one step commands consistently Safety/Judgement: Decreased awareness of safety;Decreased awareness of deficits Awareness: Intellectual Problem Solving: Difficulty sequencing;Slow processing;Requires verbal cues;Requires tactile cues General Comments: Pt pleasant and willing to participate in therapy. Pt easily distracted by environment.         Exercises     Shoulder Instructions       General Comments Fatigues easily     Pertinent Vitals/ Pain       Pain Assessment: Faces Faces Pain Scale: Hurts little more Pain Location: Head and neck Pain Descriptors / Indicators: Grimacing;Guarding Pain Intervention(s): Limited activity within patient's tolerance;Monitored during session  Home Living                                          Prior Functioning/Environment              Frequency           Progress Toward Goals  OT Goals(current goals can now be found in the care plan section)  Progress towards OT goals: Progressing toward goals  Acute Rehab OT Goals Patient Stated Goal: to go home ADL Goals Pt Will Perform Grooming: with set-up;with supervision;sitting Pt Will Perform Upper Body Dressing: with supervision;sitting Pt/caregiver will Perform Home Exercise Program: Increased strength;Increased ROM;Both right and left upper extremity;With written HEP provided;With Supervision Additional ADL Goal #1: Pt will maintain sitting balance EOB > with  minguard assist as precursor to ADL completion. Additional ADL Goal #2: Pt will follow 1 step commands with 75% accuracy during functional task.  Plan Discharge plan remains appropriate    Co-evaluation    PT/OT/SLP Co-Evaluation/Treatment: Yes Reason for Co-Treatment: Complexity of the patient's impairments (multi-system involvement);For patient/therapist safety;To address functional/ADL transfers PT goals addressed during session: Mobility/safety with mobility;Balance OT goals addressed during session: ADL's and self-care;Strengthening/ROM      AM-PAC OT "6 Clicks" Daily Activity     Outcome Measure   Help from another person eating meals?: A Little Help from another person taking care of personal grooming?: A Little Help from another person toileting, which includes using toliet, bedpan, or urinal?: A Lot Help from another person bathing (including washing, rinsing, drying)?: A Lot Help from another person to put on and taking off regular upper body clothing?: A Lot Help from another person to put on and taking off regular lower body clothing?: A Lot 6 Click Score: 14    End of Session Equipment Utilized During Treatment: Other (comment)(sara stedy)  OT Visit Diagnosis: Muscle weakness (generalized) (M62.81);Other abnormalities of gait and mobility (R26.89);Other symptoms and signs involving cognitive function   Activity Tolerance Patient limited by  fatigue   Patient Left in chair;with call bell/phone within reach;with chair alarm set   Nurse Communication Mobility status        Time: 9774-1423 OT Time Calculation (min): 34 min  Charges: OT General Charges $OT Visit: 1 Visit OT Treatments $Therapeutic Activity: 8-22 mins  Peterson Ao OTR/L [phone]   Peterson Ao 08/15/2019, 2:11 PM

## 2019-08-15 NOTE — Progress Notes (Signed)
  Speech Language Pathology Treatment: Dysphagia  Patient Details Name: Cindy [name] MRN: [id number] DOB: [birth year] Today's Date: 08/15/2019 Time: 1540-1600 SLP Time Calculation (min) (ACUTE ONLY): 20 min  Assessment / Plan / Recommendation Clinical Impression  Pt was encountered awake/alert with brother present at bedside.  RN reported that pt had poor PO intake and MD requested that tube feeds be stopped in order to hopefully increase her appetite.  Pt was seen with trials of thin liquid, nectar-thick liquid, and puree.  She was noted to be very impulsive with liquid trials via straw sip and she was observed to take large, serial sips despite verbal cues to take small, single sips.  Pt benefited from external manipulation of the straw to limit bolus size and slow rate of intake.  An immediate cough was observed following 1/6 straw sips of thin liquid when pt was laughing.  A delayed cough was additionally observed following 1/5 trials of nectar-thick liquid.  No overt s/sx of aspiration were observed with puree trials.  Recommend continuation of Dysphagia 1 (puree) solids and nectar-thick liquids with medications administered crushed in puree.  Pt will benefit from full supervision to assist with feeding and to cue for compensatory strategies.  SLP will f/u per POC.     HPI HPI: 59 y/o F who presented to [facility] 3/19 with reports of altered mental status. Active seizures on arrival to ER.  Hx of polysubstance abuse. Intubated from 3/19-3/22.       SLP Plan  Continue with current plan of care       Recommendations  Diet recommendations: Nectar-thick liquid;Dysphagia 1 (puree) Liquids provided via: Cup;Straw Medication Administration: Crushed with puree Supervision: Staff to assist with self feeding;Full supervision/cueing for compensatory strategies Compensations: Minimize environmental distractions;Slow rate;Small sips/bites;Clear throat intermittently                Oral Care  Recommendations: Oral care BID Follow up Recommendations: Skilled Nursing facility SLP Visit Diagnosis: Dysphagia, oropharyngeal phase (R13.12) Plan: Continue with current plan of care       GO               Villa Herb M.S., CCC-SLP Acute Rehabilitation Services Office: [phone]  Shanon Rosser [facility] 08/15/2019, 4:05 PM

## 2019-08-15 NOTE — Progress Notes (Signed)
MD gave orders to use lavage on cortrak. Nurse flushed cortrak with 50cc of sterile water and pulled back to receive 10 cc of blood tinged fluid with noticeable clots. MD made aware of nurse findings. Reported off to night shift nurse of pt change in condition and night provider notified.

## 2019-08-15 NOTE — Progress Notes (Signed)
Physical Therapy Treatment Patient Details Name: Cindy [name] MRN: [id number] DOB: July 02, 1960 Today's Date: 08/15/2019    History of Present Illness 59 y/o F who presented to [facility] 3/19 with reports of altered mental status. Active seizures on arrival to ER.  Hx of polysubstance abuse. Intubated from 3/19-3/22.  Other PMH includes CVA and HTN.     PT Comments    Pt progressing with mobility. Sat EOB with PT and OT x 10 mins working on trunk control with side leaning and reaching activities. When pt attending to this task she is able to maintain balance with min-guard A and occasional min A when reaching out of BOS. However, when distracted loses balance and needs mod A to prevent and correct. Used stedy to work on sit to stand as well as standing tolerance. Min A +2 to stand to stedy, mod A +2 to stand from recliner without use of stedy. Reliant on knee blocking plate in stedy, not yet ready to step. PT will continue to follow.    Follow Up Recommendations  CIR;Supervision/Assistance - 24 hour     Equipment Recommendations  Rolling walker with 5" wheels;3in1 (PT)    Recommendations for Other Services Rehab consult     Precautions / Restrictions Precautions Precautions: Fall Restrictions Weight Bearing Restrictions: No    Mobility  Bed Mobility Overal bed mobility: Needs Assistance Bed Mobility: Supine to Sit;Sit to Supine     Supine to sit: HOB elevated;Min assist     General bed mobility comments: pt initiates sup>sit, min A for trunk control as she came to sitting  Transfers Overall transfer level: Needs assistance Equipment used: Ambulation equipment used Transfers: Sit to/from [employer] Transfers Sit to Stand: +2 safety/equipment;Mod assist;Min assist Stand pivot transfers: Total assist       General transfer comment: used stedy to increase safety and work on sit<>stand. Min A +2 to stedy but needed mod A +2 from recliner. Reliant on knee plate of stedy.  Fatigued very quickly when sitting within stedy, vc's for thoracic and cervical extension  Ambulation/Gait             General Gait Details: pregait activities in stedy to increase standing tolerance and balance, not yet ready to step   Stairs             Wheelchair Mobility    Modified Rankin (Stroke Patients Only)       Balance Overall balance assessment: Needs assistance Sitting-balance support: Feet supported;Bilateral upper extremity supported;No upper extremity supported Sitting balance-Leahy Scale: Poor Sitting balance - Comments: when focusing on sitting balance activities (leaning, reaching), maintained with min-guard and occasional min A when reaching out of BOS. When distracted, required mod A to maintain sitting balance.  Postural control: Right lateral lean;Posterior lean(anterior) Standing balance support: Bilateral upper extremity supported Standing balance-Leahy Scale: Poor Standing balance comment: mod A +2                            Cognition Arousal/Alertness: Awake/alert Behavior During Therapy: WFL for tasks assessed/performed Overall Cognitive Status: Impaired/Different from baseline Area of Impairment: Orientation;Safety/judgement;Following commands;Awareness;Problem solving;Memory;Attention                 Orientation Level: Disoriented to;Time;Situation;Place Current Attention Level: Sustained Memory: Decreased short-term memory Following Commands: Follows one step commands consistently Safety/Judgement: Decreased awareness of safety;Decreased awareness of deficits Awareness: Intellectual Problem Solving: Difficulty sequencing;Slow processing;Requires verbal cues;Requires tactile cues General Comments: Does not  know the date but did know that it was the 3rd month. First guessed year as 41 then 2001 and finally 2021. Decreased positional awareness and easily distracted      Exercises      General Comments General  comments (skin integrity, edema, etc.): STM to R sided neck musculature while sitting in stedy, pt reported this helped R head pain      Pertinent Vitals/Pain Pain Assessment: Faces Faces Pain Scale: Hurts little more Pain Location: R sided neck and head Pain Descriptors / Indicators: Grimacing;Guarding Pain Intervention(s): Limited activity within patient's tolerance;Monitored during session    Home Living                      Prior Function            PT Goals (current goals can now be found in the care plan section) Acute Rehab PT Goals Patient Stated Goal: to go home PT Goal Formulation: With patient Time For Goal Achievement: 08/23/19 Potential to Achieve Goals: Good Progress towards PT goals: Progressing toward goals    Frequency    Min 3X/week      PT Plan Current plan remains appropriate    Co-evaluation PT/OT/SLP Co-Evaluation/Treatment: Yes Reason for Co-Treatment: Complexity of the patient's impairments (multi-system involvement);For patient/therapist safety;Necessary to address cognition/behavior during functional activity PT goals addressed during session: Mobility/safety with mobility;Balance        AM-PAC PT "6 Clicks" Mobility   Outcome Measure  Help needed turning from your back to your side while in a flat bed without using bedrails?: A Lot Help needed moving from lying on your back to sitting on the side of a flat bed without using bedrails?: A Lot Help needed moving to and from a bed to a chair (including a wheelchair)?: A Lot Help needed standing up from a chair using your arms (e.g., wheelchair or bedside chair)?: A Lot Help needed to walk in hospital room?: Total Help needed climbing 3-5 steps with a railing? : Total 6 Click Score: 10    End of Session Equipment Utilized During Treatment: Gait belt Activity Tolerance: Patient limited by fatigue Patient left: with call bell/phone within reach;in chair;with chair alarm set Nurse  Communication: Mobility status PT Visit Diagnosis: Muscle weakness (generalized) (M62.81)     Time: 1220-1250 PT Time Calculation (min) (ACUTE ONLY): 30 min  Charges:  $Therapeutic Activity: 8-22 mins                     Leighton Roach, [place]  Pager [phone] Office [place] 08/15/2019, 1:31 PM

## 2019-08-15 NOTE — Progress Notes (Signed)
-  Called by the patient's nurse to inform me that patient vomited blood clot. -Tube feeds have been on hold for 4 hours. -Gastric lavage revealed blood as per patient's nurse. -We will hold aspirin and Plavix, monitor H&H every 6 hours. -Start Protonix drip -GI consult done (Dr. Vida Rigger) -Sent to secure chart to neurologist, Dr. Marlowe Shores, informing him that aspirin and Plavix have been held due to the GI bleed. -Type and crossmatch blood, in case blood transfusion is needed -Further management will depend on hospital course. -Continue to hold tube feeds.

## 2019-08-15 NOTE — Progress Notes (Signed)
TCD has been completed. Preliminary results can be found in CV Proc through chart review.   08/15/19 11:51 AM Olen Cordial RVT

## 2019-08-15 NOTE — Progress Notes (Signed)
MD notified pf patient BP of 88/59 and some blood with clots in the cortrak. Awaiting response.

## 2019-08-16 ENCOUNTER — Encounter (HOSPITAL_COMMUNITY): Payer: Self-pay | Admitting: Student

## 2019-08-16 ENCOUNTER — Encounter (HOSPITAL_COMMUNITY)
Admission: EM | Disposition: A | Discharge status: Rehabilitation Facility | Payer: Self-pay | Source: Home / Self Care | Attending: Emergency Medicine

## 2019-08-16 ENCOUNTER — Inpatient Hospital Stay (HOSPITAL_COMMUNITY): Payer: Self-pay | Admitting: Anesthesiology

## 2019-08-16 DIAGNOSIS — E119 Type 2 diabetes mellitus without complications: Secondary | ICD-10-CM

## 2019-08-16 HISTORY — PX: ESOPHAGOGASTRODUODENOSCOPY: SHX5428

## 2019-08-16 LAB — RENAL FUNCTION PANEL
Albumin: 2.5 g/dL — ABNORMAL LOW (ref 3.5–5.0)
Anion gap: 9 (ref 5–15)
BUN: 36 mg/dL — ABNORMAL HIGH (ref 6–20)
CO2: 21 mmol/L — ABNORMAL LOW (ref 22–32)
Calcium: 8.6 mg/dL — ABNORMAL LOW (ref 8.9–10.3)
Chloride: 99 mmol/L (ref 98–111)
Creatinine, Ser: 1.18 mg/dL — ABNORMAL HIGH (ref 0.44–1.00)
GFR calc Af Amer: 58 mL/min — ABNORMAL LOW (ref >60)
GFR calc non Af Amer: 50 mL/min — ABNORMAL LOW (ref >60)
Glucose, Bld: 144 mg/dL — ABNORMAL HIGH (ref 70–99)
Phosphorus: 6.2 mg/dL — ABNORMAL HIGH (ref 2.5–4.6)
Potassium: 5 mmol/L (ref 3.5–5.1)
Sodium: 129 mmol/L — ABNORMAL LOW (ref 135–145)

## 2019-08-16 LAB — GLUCOSE, CAPILLARY
Glucose-Capillary: 110 mg/dL — ABNORMAL HIGH (ref 70–99)
Glucose-Capillary: 111 mg/dL — ABNORMAL HIGH (ref 70–99)
Glucose-Capillary: 123 mg/dL — ABNORMAL HIGH (ref 70–99)
Glucose-Capillary: 130 mg/dL — ABNORMAL HIGH (ref 70–99)
Glucose-Capillary: 114 mg/dL — ABNORMAL HIGH (ref 70–99)

## 2019-08-16 LAB — CULTURE, BLOOD (ROUTINE X 2)
Culture: NO GROWTH
Culture: NO GROWTH
Special Requests: ADEQUATE

## 2019-08-16 LAB — HEMOGLOBIN AND HEMATOCRIT, BLOOD
HCT: 24.5 % — ABNORMAL LOW (ref 36.0–46.0)
HCT: 24.9 % — ABNORMAL LOW (ref 36.0–46.0)
HCT: 21.9 % — ABNORMAL LOW (ref 36.0–46.0)
Hemoglobin: 8.3 g/dL — ABNORMAL LOW (ref 12.0–15.0)
Hemoglobin: 8.4 g/dL — ABNORMAL LOW (ref 12.0–15.0)
Hemoglobin: 7.4 g/dL — ABNORMAL LOW (ref 12.0–15.0)

## 2019-08-16 SURGERY — EGD (ESOPHAGOGASTRODUODENOSCOPY)
Anesthesia: Monitor Anesthesia Care

## 2019-08-16 MED ORDER — SODIUM CHLORIDE 0.9 % IV SOLN: INTRAVENOUS | Status: DC

## 2019-08-16 MED ORDER — LACTATED RINGERS IV SOLN
INTRAVENOUS | Status: AC | PRN
  Administered 2019-08-16: 1000 mL via INTRAVENOUS

## 2019-08-16 MED ORDER — LIDOCAINE HCL (CARDIAC) PF 100 MG/5ML IV SOSY
PREFILLED_SYRINGE | INTRAVENOUS | Status: DC | PRN
  Administered 2019-08-16: 100 mg via INTRAVENOUS

## 2019-08-16 MED ORDER — PROPOFOL 500 MG/50ML IV EMUL
INTRAVENOUS | Status: DC | PRN
  Administered 2019-08-16: 100 ug/kg/min via INTRAVENOUS

## 2019-08-16 MED ORDER — SODIUM CHLORIDE 0.9 % IV SOLN: INTRAVENOUS | Status: DC | PRN

## 2019-08-16 MED ORDER — PROPOFOL 10 MG/ML IV BOLUS
INTRAVENOUS | Status: DC | PRN
  Administered 2019-08-16: 30 mg via INTRAVENOUS
  Administered 2019-08-16: 40 mg via INTRAVENOUS
  Administered 2019-08-16: 30 mg via INTRAVENOUS

## 2019-08-16 NOTE — Progress Notes (Signed)
STROKE TEAM PROGRESS NOTE    )   INTERVAL HISTORY Patient had hematemesis yesterday and hemoglobin dropped from 11.7 to -8.3.  She remained hemodynamically stable.  Aspirin and Plavix have been held and GI has been consulted and plan is for upper endoscopy today.  Neurologically unchanged.    OBJECTIVE Vitals:   08/16/19 0738 08/16/19 1131 08/16/19 1151 08/16/19 1205  BP: 107/62  115/76 (!) 156/67  Pulse: (!) 103 (!) 122 (!) 105   Resp: 18  16 (!) 21  Temp: 99.3 F (37.4 C)  98.5 F (36.9 C) 98.6 F (37 C)  TempSrc: Oral  Oral Axillary  SpO2: 97% 100% 100% 97%  Weight:      Height:        CBC:  Recent Labs  Lab 08/11/19 0301 08/11/19 0301 08/12/19 0240 08/15/19 1900 08/16/19 0043 08/16/19 0603  WBC 6.0  --  7.4  --   --   --   HGB 11.4*   < > 11.7*   < > 8.3* 8.4*  HCT 33.5*   < > 34.5*   < > 24.5* 24.9*  MCV 95.4  --  96.1  --   --   --   PLT 229  --  277  --   --   --    < > = values in this interval not displayed.    Basic Metabolic Panel:  Recent Labs  Lab 08/13/19 0519 08/16/19 0043  NA 130* 129*  K 4.5 5.0  CL 98 99  CO2 20* 21*  GLUCOSE 133* 144*  BUN 13 36*  CREATININE 0.99 1.18*  CALCIUM 9.3 8.6*  PHOS  --  6.2*    Lipid Panel:     Component Value Date/Time   CHOL 102 08/11/2019 0301   CHOL 241 (H) 03/26/2015 0921   TRIG 31 08/11/2019 0301   HDL 50 08/11/2019 0301   HDL 40 03/26/2015 0921   CHOLHDL 2.0 08/11/2019 0301   VLDL 6 08/11/2019 0301   LDLCALC 46 08/11/2019 0301   LDLCALC 191 (H) 03/26/2015 0921   HgbA1c:  Lab Results  Component Value Date   HGBA1C 5.1 08/11/2019   Urine Drug Screen:     Component Value Date/Time   LABOPIA NONE DETECTED 08/05/2019 1225   COCAINSCRNUR POSITIVE (A) 08/05/2019 1225   LABBENZ NONE DETECTED 08/05/2019 1225   AMPHETMU NONE DETECTED 08/05/2019 1225   THCU NONE DETECTED 08/05/2019 1225   LABBARB NONE DETECTED 08/05/2019 1225    Alcohol Level     Component Value Date/Time   ETH 21 (H)  08/05/2019 1201    IMAGING  VAS [place] TRANSCRANIAL DOPPLER  Result Date: 08/16/2019  Transcranial Doppler Indications: Stroke. Limitations for diagnostic windows: Unable to insonate right transtemporal window. Unable to insonate left transtemporal window. Comparison Study: No prior studies. Performing Technologist: Chanda Busing RVT  Examination Guidelines: A complete evaluation includes B-mode imaging, spectral Doppler, color Doppler, and power Doppler as needed of all accessible portions of each vessel. Bilateral testing is considered an integral part of a complete examination. Limited examinations for reoccurring indications may be performed as noted.  +----------+-------------+----------+-----------+-------+ RIGHT TCD Right VM (cm)Depth (cm)PulsatilityComment +----------+-------------+----------+-----------+-------+ Opthalmic     23.00                 1.55            +----------+-------------+----------+-----------+-------+ ICA siphon    21.00  1.48            +----------+-------------+----------+-----------+-------+ Vertebral    -22.00                 1.41            +----------+-------------+----------+-----------+-------+ Distal ICA    64.00                 0.72            +----------+-------------+----------+-----------+-------+  +----------+------------+----------+-----------+-------+ LEFT TCD  Left VM (cm)Depth (cm)PulsatilityComment +----------+------------+----------+-----------+-------+ Opthalmic    25.00                 1.51            +----------+------------+----------+-----------+-------+ ICA siphon   20.00                 1.46            +----------+------------+----------+-----------+-------+ Vertebral    -12.00                1.36            +----------+------------+----------+-----------+-------+ Distal ICA   19.00                 0.77            +----------+------------+----------+-----------+-------+   +------------+-------+-------+             VM cm/sComment +------------+-------+-------+ Prox Basilar-25.00  1.12   +------------+-------+-------+ Dist Basilar-21.00  1.18   +------------+-------+-------+ Summary:  Absent bitemporal windows limits exam. Normal mean flow velocities in both opthalmics,carotid siphons,and vertebral and basilar arteries. *See table(s) above for TCD measurements and observations.  Diagnosing physician: Antony Contras MD Electronically signed by Antony Contras MD on 08/16/2019 at 12:38:03 PM.    Final    MRI Head WO Contrast 08/10/19 IMPRESSION: 1. Scattered small foci of restricted diffusion within the bilateral cerebral hemispheres, consistent with small acute infarcts, likely embolic. 2. Advanced white matter disease, more pronounced than expected for age. 3. Mild inflammatory paranasal sinus disease and bilateral mastoid effusions.  CT Head WO Contrast 08/10/19 IMPRESSION: 1. No acute intracranial pathology. 2. Extensive periventricular and deep white matter hypodensity markedly increased compared to remote prior CT dated 2006. Findings may reflect small-vessel white matter disease or alternately demyelinating disorder such as multiple sclerosis. Correlate with referable clinical history.  Transthoracic Echocardiogram  08/06/19 IMPRESSIONS  1. Left ventricular ejection fraction, by estimation, is 60 to 65%. The  left ventricle has normal function. The left ventricle has no regional  wall motion abnormalities. There is mild left ventricular hypertrophy.  Left ventricular diastolic parameters  are consistent with Grade I diastolic dysfunction (impaired relaxation).  2. Right ventricular systolic function is normal. The right ventricular  size is normal.  3. The mitral valve is grossly normal. Mild mitral valve regurgitation.  4. The aortic valve is tricuspid. Aortic valve regurgitation is not  visualized.   Bilateral Carotid Dopplers   08/11/19 Summary:  Right Carotid: Velocities in the right ICA are consistent with a 1-39% stenosis.  Left Carotid: Velocities in the left ICA are consistent with a 1-39% stenosis.  Vertebrals: Bilateral vertebral arteries demonstrate antegrade flow.  Subclavians: Normal flow hemodynamics were seen in bilateral subclavian arteries.   ECG - ST rate 107 BPM. (See cardiology reading for complete details)  EEG  08/07/19 IMPRESSION: This is an abnormal EEG secondary to general background slowing.  This finding may be seen with a diffuse disturbance that is etiologically  nonspecific, but may include a metabolic encephalopathy or post-ictal state, among other possibilities.  The fast beta activity noted likely represents a medication effect.  No epileptiform activity is noted.     PHYSICAL EXAM Blood pressure (!) 156/67, pulse (!) 105, temperature 98.6 F (37 C), temperature source Axillary, resp. rate (!) 21, height 5\' 4"  (1.626 m), weight 73.9 kg, SpO2 97 %.  Pleasant middle-aged Caucasian lady not in distress  Afebrile. Head is nontraumatic. Neck is supple without bruit.    Cardiac exam no murmur or gallop. Lungs are clear to auscultation. Distal pulses are well felt. Neurological Exam : Awake  Alert oriented x 3.  Slightly dysarthric speech.  No aphasia.  Eye movements full without nystagmus.fundi were not visualized. Vision acuity and fields appear normal. Hearing is normal. Palatal movements are normal. Face symmetric. Tongue midline. Normal strength, tone, reflexes and coordination. Normal sensation. Gait deferred.  ASSESSMENT/PLAN Cindy [name] is a 59 y.o. female with history of polysubstance abuse, HTN, HLD, CVA (2005) who presented to [facility] for AMS/ seizures and weakness. She did not receive IV t-PA due to late presentation (>4.5 hours from time of onset)   Stroke:   Resultant  Seizure and multiple infarcts  Code Stroke CT Head - not ordered  CT head - 08/10/19- no acute  findings.  Extensive periventricular and deep white matter hypodensity markedly increased compared to remote prior CT dated 2006. Findings may reflect small-vessel white matter disease or alternately demyelinating disorder such as multiple sclerosis.   MRI head - Scattered small foci of restricted diffusion within the bilateral cerebral hemispheres, consistent with small acute infarcts, likely embolic.  MRA head - not ordered  CTA H&N - not ordered  CT Perfusion - not ordered  EEG - This is an abnormal EEG secondary to general background slowing.  This finding may be seen with a diffuse disturbance that is etiologically nonspecific, but may include a metabolic encephalopathy or post-ictal state, among other possibilities.  The fast beta activity noted likely represents a medication effect.  No epileptiform activity is noted.    Carotid Doppler - unremarkable  2D Echo - EF 60 - 65%. No cardiac source of emboli identified.   Sars Corona Virus 2 - negative  LDL - 46  HgbA1c - 5.1  UDS - cocaine  VTE prophylaxis - Lovenox Diet  Diet Order            Diet NPO time specified  Diet effective now              No antithrombotic prior to admission, now on aspirin 24m g daily and Plavix 75 mg daily into 3 weeks followed by aspirin alone  Patient will be counseled to be compliant with her antithrombotic medications  Ongoing aggressive stroke risk factor management  Therapy recommendations:  pending  Disposition:  Pending  Hypertension  Home BP meds: Amlodipine  Current BP meds: Amlodipine ; Clonidine  Stable . Permissive hypertension (OK if < 220/120) but gradually normalize in 5-7 days  . Long-term BP goal normotensive  Hyperlipidemia  Home Lipid lowering medication: Zocor 20 mg daily  LDL 46, goal < 70  Current lipid lowering medication: none - resume Zocor  Continue statin at discharge  Other Stroke Risk Factors  Advanced age  ETOH use, advised to drink no  more than 1 alcoholic beverage per day.  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  Recommend Keppra for seizure prophylaxis but hold aspirin and Plavix due to upper GI bleed for now and continue aggressive risk factor modification.  Plan for upper GI endoscopy later today by GI.  Discussed with Dr. Nelson Chimes greater than 50% time during this 25-minute visit was spent on counseling and coordination of care and answering questions about her stroke Delia Heady, MD To contact Stroke Continuity provider, please refer to [email or web address]. After hours, contact General Neurology

## 2019-08-16 NOTE — Progress Notes (Signed)
Inpatient Rehabilitation Admissions Coordinator  Noted events overnight. I will follow.  Ottie Glazier, RN, MSN Rehab Admissions Coordinator [phone] 08/16/2019 11:48 AM

## 2019-08-16 NOTE — Progress Notes (Signed)
Patient removed the cortrak. MD notified. Will continue to monitor.

## 2019-08-16 NOTE — Progress Notes (Signed)
Cindy [name] 1:06 PM  Subjective: Patient without any previous GI work-up in her hospital computer chart reviewed and her case discussed with the hospital team as well as our PA and she is currently not having any complaints and did say she threw up blood a year ago without work-up and denies aspirin and nonsteroidals at home and has no other complaints  Objective: Vital signs stable afebrile no acute distress exam please see preassessment evaluation decreased hemoglobin labs reviewed increased BUN  Assessment: Upper GI bleeding in patient with multiple medical problems  Plan: We discussed endoscopy and will proceed with anesthesia assistance with further work-up and plans pending those findings  [facility]  office [phone] After 5PM or if no answer call [phone]

## 2019-08-16 NOTE — PMR Pre-admission (Signed)
PMR Admission Coordinator Pre-Admission Assessment  Patient: Cindy [name] is an 59 y.o., female MRN: [id number] DOB: 1961/05/02 Height: 5\' 4"  (162.6 cm) Weight: 72.6 kg              Insurance Information HMO:     PPO:      PCP:      IPA:      80/20:      OTHER:  PRIMARY: uninsured     is Artist [phone]  Medicaid Application Date:       Case Manager:  Disability Application Date:       Case Worker:   The "Data Collection Information Summary" for patients in Inpatient Rehabilitation Facilities with attached "Privacy Act Statement-Health Care Records" was provided and verbally reviewed with: N/A  Emergency Contact Information Contact Information    Name Relation Home Work Mobile   [name] Spouse   [phone]   [name], [name] [phone]  [phone]     Current Medical History  Patient Admitting Diagnosis: CVA  History of Present Illness: Cindy [name]. Friedel is a 59 year old right-handed female with history of polysubstance abuse, hypertension, CVA 2005, hyperlipidemia.  Per chart review lives with spouse and independent prior to admission.  1 level home one-step to entry.  Presented 08/05/2019 to [facility] hospital with altered mental status and actively seizing.  She was noted to have eye deviation to the right with twitching of the face.  She was treated with IV Ativan and loaded with Keppra.  Alcohol level 21, BNP 121, urine drug screen positive cocaine, lactic acid 2.7, urinalysis negative nitrate, sodium 124, creatinine 1.05.  She did require intubation for airway protection.  Cranial CT scan negative for acute changes.  Extensive periventricular and deep white matter hypodensity markedly increased compared to remote prior CT dated 2006.  Patient did not receive TPA.  She was transferred to [facility] for further evaluation.  Echocardiogram with ejection fraction of 60% grade 1 diastolic dysfunction.  EEG negative for seizure.  Carotid  Dopplers negative for ICA stenosis.  Follow-up MRI showed scattered small foci of restricted diffusion within the bilateral cerebral hemispheres consistent with small acute infarcts likely embolic.  Maintain on aspirin and Plavix x3 weeks followed by aspirin alone for CVA prophylaxis.  Subcutaneous Lovenox for DVT prophylaxis.  Gastroenterology services Dr. [facility] consulted 08/16/2019 for Hematemesis/anemia with upper GI endoscopy 08/16/2019 showing medium size hiatal hernia present.  There was esophageal mucosal changes suspicious for short segment Barrett's esophagus present in the lower third of the esophagus.  1 cratered small distal esophageal ulcer with stigmata of recent bleeding found.  The entire examined stomach was normal and patient was placed on Protonix twice daily.  Dysphagia #1 nectar thick liquids.  Therapy evaluations were completed and pt was recommended for CIR.   Past Medical History  Past Medical History:  Diagnosis Date  . Abnormal LFTs    Hx of  . CVA (cerebral vascular accident) (HCC) 2005  . Hyperlipidemia   . Hypertension   . Polysubstance dependence including opioid type drug, episodic abuse (HCC)    cocaine/etoh     Family History  family history includes Asthma in her mother; Bronchitis in her mother; Cancer in her father; Hypertension in her mother.  Prior Rehab/Hospitalizations:  Has the patient had prior rehab or hospitalizations prior to admission? yes  Has the patient had major surgery during 100 days prior to admission? Yes  Current Medications   Current Facility-Administered Medications:  .  0.9 %  sodium chloride infusion, , Intravenous, Continuous, Vida Rigger, MD, Stopped at 08/08/19 1952 .  bisacodyl (DULCOLAX) suppository 10 mg, 10 mg, Rectal, Daily PRN, Vida Rigger, MD .  chlorhexidine (PERIDEX) 0.12 % solution 15 mL, 15 mL, Mouth Rinse, BID, Vida Rigger, MD, 15 mL at 08/19/19 0851 .  clopidogrel (PLAVIX) tablet 75 mg, 75 mg, Oral, Daily, Amin,  Ankit Chirag, MD, 75 mg at 08/19/19 0851 .  hydrALAZINE (APRESOLINE) injection 10 mg, 10 mg, Intravenous, Q4H PRN, Vida Rigger, MD, 10 mg at 08/10/19 0242 .  insulin aspart (novoLOG) injection 0-9 Units, 0-9 Units, Subcutaneous, Q4H, Vida Rigger, MD, 1 Units at 08/19/19 0850 .  levETIRAcetam (KEPPRA XR) 24 hr tablet 500 mg, 500 mg, Oral, Daily, Vida Rigger, MD, 500 mg at 08/19/19 0851 .  lip balm (CARMEX) ointment, , Topical, PRN, Vida Rigger, MD .  LORazepam (ATIVAN) injection 1 mg, 1 mg, Intravenous, Q4H PRN, Vida Rigger, MD, 1 mg at 08/19/19 8315 .  magnesium oxide (MAG-OX) tablet 800 mg, 800 mg, Oral, Q4H, Amin, Ankit Chirag, MD, 800 mg at 08/19/19 0850 .  MEDLINE mouth rinse, 15 mL, Mouth Rinse, q12n4p, Vida Rigger, MD, 15 mL at 08/18/19 1658 .  metoprolol tartrate (LOPRESSOR) injection 5 mg, 5 mg, Intravenous, Q6H PRN, Vida Rigger, MD, 5 mg at 08/10/19 0710 .  multivitamin liquid 15 mL, 15 mL, Oral, Daily, Vida Rigger, MD, 15 mL at 08/19/19 0851 .  pantoprazole (PROTONIX) EC tablet 40 mg, 40 mg, Oral, BID AC, Amin, Ankit Chirag, MD, 40 mg at 08/19/19 0637 .  potassium chloride SA (KLOR-CON) CR tablet 40 mEq, 40 mEq, Oral, Q4H, Amin, Ankit Chirag, MD, 40 mEq at 08/19/19 0850 .  QUEtiapine (SEROQUEL) tablet 25 mg, 25 mg, Oral, QHS, Vida Rigger, MD, 25 mg at 08/18/19 2144 .  Resource ThickenUp Clear, , Oral, PRN, Vida Rigger, MD .  sennosides (SENOKOT) 8.8 MG/5ML syrup 5 mL, 5 mL, Per Tube, BID PRN, Vida Rigger, MD .  simvastatin (ZOCOR) tablet 20 mg, 20 mg, Oral, q1800, Vida Rigger, MD, 20 mg at 08/18/19 1658  Patients Current Diet:  Diet Order            DIET SOFT Room service appropriate? Yes; Fluid consistency: Thin  Diet effective now              Precautions / Restrictions Precautions Precautions: Fall Restrictions Weight Bearing Restrictions: No   Has the patient had 2 or more falls or a fall with injury in the past year?No  Prior Activity Level Limited Community  (1-2x/wk): Independent; unemployed   Prior Functional Level Prior Function Level of Independence: Independent  Self Care: Did the patient need help bathing, dressing, using the toilet or eating?  Independent  Indoor Mobility: Did the patient need assistance with walking from room to room (with or without device)? Independent  Stairs: Did the patient need assistance with internal or external stairs (with or without device)? Independent  Functional Cognition: Did the patient need help planning regular tasks such as shopping or remembering to take medications? Independent  Home Assistive Devices / Equipment Home Assistive Devices/Equipment: None Home Equipment: Cane - single point, Shower seat  Prior Device Use: Indicate devices/aids used by the patient prior to current illness, exacerbation or injury? None of the above  Current Functional Level Cognition  Overall Cognitive Status: Impaired/Different from baseline Current Attention Level: Sustained Orientation Level: Oriented to person, Oriented to place, Oriented to time, Disoriented to situation Following Commands: Follows one step  commands consistently, Follows multi-step commands consistently Safety/Judgement: Decreased awareness of safety, Decreased awareness of deficits General Comments: Pt pleasant and willing to participate in therapy. Pt easily distracted by environment.     Extremity Assessment (includes Sensation/Coordination)  Upper Extremity Assessment: Generalized weakness  Lower Extremity Assessment: Defer to PT evaluation    ADLs  Overall ADL's : Needs assistance/impaired Eating/Feeding: Set up, Supervision/ safety, Sitting Eating/Feeding Details (indicate cue type and reason): Pt able to grasp and manipulate utensils with right hand. Noted 0 drops throughout. Pt able to reach, grasp, and bring drink to mouth.  Grooming: Wash/dry hands, Wash/dry face, Set up, Supervision/safety, Sitting Toileting- Clothing  Manipulation and Hygiene: Maximal assistance, Sit to/from stand Toileting - Clothing Manipulation Details (indicate cue type and reason): Assist for peri care and clothing management in standing.  Functional mobility during ADLs: (Utilized sara stedy for transfer.) General ADL Comments: Pt tolerated sitting EOB 15+ min with variable min guard to mod assist to maintain balance. Pt engaged in self-care tasks while seated in bedside chair.     Mobility  Overal bed mobility: Needs Assistance Bed Mobility: Supine to Sit, Sit to Supine Supine to sit: Supervision Sit to supine: HOB elevated, Supervision General bed mobility comments: Use of bed rail, good initiation.    Transfers  Overall transfer level: Needs assistance Equipment used: None, 1 person hand held assist, Rolling walker (2 wheeled) Transfer via Lift Equipment: Stedy Transfers: Sit to/from Stand Sit to Stand: Min guard Stand pivot transfers: Total assist General transfer comment: pt able to complete 5xSTS with use of armrests but no AD in 40 sec, min G for safety, at other times, used RW or HHA of 1, and was able to complete without phsyical assist    Ambulation / Gait / Stairs / Wheelchair Mobility  Ambulation/Gait Ambulation/Gait assistance: Land (Feet): 15 Feet(15 ft x2) Assistive device: Rolling walker (2 wheeled), 1 person hand held assist Gait Pattern/deviations: Step-to pattern, Decreased stride length, Trunk flexed General Gait Details: pt able to demo slow but steady ambulation in room, taking short steps with minimal clearance, but no LOB. very slow with turns, improved stability with RW Gait velocity interpretation: <1.31 ft/sec, indicative of household ambulator    Posture / Balance Dynamic Sitting Balance Sitting balance - Comments: pt with multiple LOB in seated position while attempting to don socks, was able to correct and return to sitting upright without assist but unable to prevent further  LOB Balance Overall balance assessment: Needs assistance Sitting-balance support: Feet supported, Bilateral upper extremity supported Sitting balance-Leahy Scale: Fair Sitting balance - Comments: pt with multiple LOB in seated position while attempting to don socks, was able to correct and return to sitting upright without assist but unable to prevent further LOB Postural control: Right lateral lean, Posterior lean Standing balance support: Bilateral upper extremity supported Standing balance-Leahy Scale: Fair Standing balance comment: able to complete sit-stand without AD, benefits from RW for ambulation    Special needs/care consideration BiPAP/CPAP n/a CPM n/a Continuous Drip IV no Dialysis n/a Life Vest n/a Oxygen n/a Special Bed seizure precautions Trach Size n/a Wound Vac n/a Skin left ankle abrasion; ecchymosis to bilateral hips; rash to buttocks Bowel mgmt: incontinent Bladder mgmt: incontinent Diabetic mgmt n/a Behavioral consideration  N/a Chemo/radiation  N/a Designated visitor is spouse, Actor   Previous Home Environment  Living Arrangements: Spouse/significant other  Lives With: Spouse Available Help at Discharge: Family, Available 24 hours/day Type of Home: House Home Layout: One level Home  Access: Stairs to enter [employer] of Steps: 1 Bathroom Shower/Tub: Optometrist: Yes How Accessible: Accessible via walker Home Care Services: No Additional Comments: unsure of accuracy of home setup as pt with impaired cognition today, very soft spoken  Discharge Living Setting Plans for Discharge Living Setting: Patient's home, Lives with (comment)(spouse) Type of Home at Discharge: House Discharge Home Layout: One level Discharge Home Access: Stairs to enter Entrance Stairs-Rails: None Entrance Stairs-Number of Steps: 1 Discharge Bathroom Shower/Tub: Tub/shower unit Discharge Bathroom Toilet:  Standard Discharge Bathroom Accessibility: Yes How Accessible: Accessible via walker Does the patient have any problems obtaining your medications?: Yes (Describe)(uninsured)  Social/Family/Support Systems Patient Roles: Spouse Contact Information: spouse, Biochemist, clinical Anticipated Caregiver: spouse Anticipated Caregiver's Contact Information: [phone] Ability/Limitations of Caregiver: no limitations Caregiver Availability: 24/7 Discharge Plan Discussed with Primary Caregiver: Yes Is Caregiver In Agreement with Plan?: Yes Does Caregiver/Family have Issues with Lodging/Transportation while Pt is in Rehab?: No  Goals/Additional Needs Patient/Family Goal for Rehab: supervision PT, OT, and SLP Expected length of stay: ELOS 7 to 10 days Dietary Needs: soft/thin Pt/Family Agrees to Admission and willing to participate: Yes Program Orientation Provided & Reviewed with Pt/Caregiver Including Roles  & Responsibilities: Yes  Decrease burden of Care through IP rehab admission: n/a  Possible need for SNF placement upon discharge: n/a  Patient Condition: This patient's medical and functional status has changed since the consult dated: 08/15/2019 in which the Rehabilitation Physician determined and documented that the patient's condition is appropriate for intensive rehabilitative care in an inpatient rehabilitation facility. See "History of Present Illness" (above) for medical update. Functional changes are: min assist. Patient's medical and functional status update has been discussed with the Rehabilitation physician and patient remains appropriate for inpatient rehabilitation. Will admit to inpatient rehab today.  Preadmission Screen Completed By: Danne Baxter, RN MSN with updates by Michel Santee, PT, DPT 08/19/2019 10:32 AM ______________________________________________________________________   Discussed status with Dr. Letta Pate on 08/19/19 at  10:34 AM  and received approval for admission  today.  Admission Coordinator: Danne Baxter, RN MSN, and Michel Santee, [place], DPT, time 10:34 Carmin Muskrat Sudie Grumbling 08/19/19

## 2019-08-16 NOTE — Progress Notes (Signed)
Calorie Count Note  48 hour calorie count ordered.  Pt is s/p endoscopy, diet is currently clear liquids and being advanced as tolerated.   3/29 Diet: Dysphagia 1 with Nectar Thick liquids Supplements: Vital Cuisine shake TID  Breakfast: 138 kcal, 2 grams protein Lunch: no meal data available Dinner: 171 kcal, 4 grams protein Supplements: not consumed  Due to pt being on clear liquids today, will likely need to continue calorie count for an additional day.  Total intake: 309 kcal (%19 of minimum estimated needs)  6 protein (8% of minimum estimated needs)  Nutrition Dx: Increased nutrient needs related to acute illness as evidenced by estimated needs.  Goal: Patient will meet greater than or equal to 90% of their needs  Intervention: Continue calorie count, Vital Cuisine TID, monitor for diet advancement  Cindy Gavia, MS, RD, LDN RD pager number and weekend/on-call pager number located in Amion.

## 2019-08-16 NOTE — Progress Notes (Signed)
Physical Therapy Treatment Patient Details Name: Cindy [name] MRN: [id number] DOB: 03-Dec-1960 Today's Date: 08/16/2019    History of Present Illness 59 y/o F who presented to [facility] 3/19 with reports of altered mental status. Active seizures on arrival to ER.  Hx of polysubstance abuse. Intubated from 3/19-3/22.  Other PMH includes CVA and HTN.     PT Comments    Pt in bed upon arrival of PT, agreeable to PT session with focus on  Functional LE strength and stability. The pt was able to demo multiple sit-stand transitions from varied heights with less assist needed, but fatigued quickly and began to c/o severe HA with activity. The pt was returned to a supine position to recover where she reported decrease in HA to 5/10. VSS throughout. The pt will continue to benefit from skilled PT to further progress functional LE strength and activity tolerance.    Follow Up Recommendations  CIR;Supervision/Assistance - 24 hour     Equipment Recommendations  Rolling walker with 5" wheels;3in1 (PT)    Recommendations for Other Services       Precautions / Restrictions Precautions Precautions: Fall Restrictions Weight Bearing Restrictions: No    Mobility  Bed Mobility Overal bed mobility: Needs Assistance Bed Mobility: Supine to Sit;Sit to Supine     Supine to sit: HOB elevated;Min guard Sit to supine: HOB elevated;Supervision   General bed mobility comments: Use of bed rail, good initiation. intermittent assist for trunk control upon sitting upright. Pt layed down spontaneously due to HA, no assist needed  Transfers Overall transfer level: Needs assistance Equipment used: Ambulation equipment used Transfers: Sit to/from Stand Sit to Stand: Min assist         General transfer comment: minA to stand from stedy seat without use of UE for LE strengthening x5 through session. Then started sit-stand from bed surface for increased challenge, x3 completed prior to increased HA  pain  Ambulation/Gait Ambulation/Gait assistance: (deferred at this time due to pain)           General Gait Details: pregait activities in stedy to increase standing tolerance and balance, not yet ready to step   Stairs             Wheelchair Mobility    Modified Rankin (Stroke Patients Only)       Balance Overall balance assessment: Needs assistance Sitting-balance support: Feet supported Sitting balance-Leahy Scale: Poor Sitting balance - Comments: pt tolerates well but benefits from at least 1 UE support to maintain without assist Postural control: Right lateral lean;Posterior lean Standing balance support: Bilateral upper extremity supported Standing balance-Leahy Scale: Poor Standing balance comment: able to come to standing with good stability without UE support but minA from PT in stedy, VCs for postural adjustment                            Cognition Arousal/Alertness: Awake/alert Behavior During Therapy: WFL for tasks assessed/performed Overall Cognitive Status: Impaired/Different from baseline Area of Impairment: Orientation;Safety/judgement;Following commands;Awareness;Problem solving;Memory;Attention                 Orientation Level: Disoriented to;Time;Situation Current Attention Level: Sustained Memory: Decreased short-term memory Following Commands: Follows one step commands consistently Safety/Judgement: Decreased awareness of safety;Decreased awareness of deficits Awareness: Intellectual Problem Solving: Difficulty sequencing;Slow processing;Requires verbal cues;Requires tactile cues General Comments: Pt pleasant and willing to participate in therapy. Pt easily distracted by environment.       Exercises General Exercises -  Lower Extremity Long Arc Quad: Strengthening;Both;10 reps;Seated Hip Flexion/Marching: AROM;Strengthening;Both;20 reps;Seated    General Comments General comments (skin integrity, edema, etc.):  fatigues easily      Pertinent Vitals/Pain Pain Assessment: 0-10 Pain Score: 10-Worst pain ever(pt reports HA with activity that increased to 10/10, but returned to 5/10 after supine rest) Pain Location: Head and neck Pain Descriptors / Indicators: Grimacing;Guarding;Headache Pain Intervention(s): Limited activity within patient's tolerance;Monitored during session;Repositioned    Home Living                      Prior Function            PT Goals (current goals can now be found in the care plan section) Acute Rehab PT Goals Patient Stated Goal: to go home PT Goal Formulation: With patient Time For Goal Achievement: 08/23/19 Potential to Achieve Goals: Good Progress towards PT goals: Progressing toward goals    Frequency    Min 3X/week      PT Plan Current plan remains appropriate    Co-evaluation              AM-PAC PT "6 Clicks" Mobility   Outcome Measure  Help needed turning from your back to your side while in a flat bed without using bedrails?: A Little Help needed moving from lying on your back to sitting on the side of a flat bed without using bedrails?: A Little Help needed moving to and from a bed to a chair (including a wheelchair)?: A Lot Help needed standing up from a chair using your arms (e.g., wheelchair or bedside chair)?: A Little Help needed to walk in hospital room?: Total Help needed climbing 3-5 steps with a railing? : Total 6 Click Score: 13    End of Session Equipment Utilized During Treatment: Gait belt Activity Tolerance: Patient limited by fatigue;Patient limited by pain Patient left: with call bell/phone within reach;with chair alarm set;in bed(RN trying to place IV prior to procedure, asked for pt to remain in bed) Nurse Communication: Mobility status PT Visit Diagnosis: Muscle weakness (generalized) (M62.81)     Time: 0076-2263 PT Time Calculation (min) (ACUTE ONLY): 18 min  Charges:  $Therapeutic Exercise: 8-22  mins                     Karma Ganja, PT, DPT   Acute Rehabilitation Department Pager #: [phone]   Otho Bellows 08/16/2019, 1:21 PM

## 2019-08-16 NOTE — Op Note (Signed)
[facility] Patient Name: Cindy [name] Procedure Date : 08/16/2019 MRN: [id number] Attending MD: Vida Rigger , MD Date of Birth: 1960/12/07 CSN: [id number] Age: 59 Admit Type: Inpatient Procedure:                Upper GI endoscopy Indications:              Acute post hemorrhagic anemia, Hematemesis Providers:                Vida Rigger, MD, Glory Rosebush, RN, Wanita Chamberlain,                            Technician Referring MD:              Medicines:                Propofol total dose 175 mg IV Complications:            No immediate complications. Estimated Blood Loss:     Estimated blood loss: none. Procedure:                Pre-Anesthesia Assessment:                           - Prior to the procedure, a History and Physical                            was performed, and patient medications and                            allergies were reviewed. The patient's tolerance of                            previous anesthesia was also reviewed. The risks                            and benefits of the procedure and the sedation                            options and risks were discussed with the patient.                            All questions were answered, and informed consent                            was obtained. Prior Anticoagulants: The patient has                            taken Plavix (clopidogrel), last dose was 1 day                            prior to procedure. ASA Grade Assessment: III - A                            patient with severe systemic disease. After  reviewing the risks and benefits, the patient was                            deemed in satisfactory condition to undergo the                            procedure.                           After obtaining informed consent, the endoscope was                            passed under direct vision. Throughout the                            procedure, the patient's blood pressure, pulse, and                             oxygen saturations were monitored continuously. The                            GIF-H190 ([id number]) Olympus gastroscope was                            introduced through the mouth, and advanced to the                            third part of duodenum. The upper GI endoscopy was                            accomplished without difficulty. The patient                            tolerated the procedure well. There was white                            material throughout her GI tract either tube feeds                            or contrast from her speech therapy eval which made                            complete evaluation difficult Scope In: Scope Out: Findings:      A medium-sized hiatal hernia was present.      There were esophageal mucosal changes suspicious for short-segment       Barrett's esophagus present in the lower third of the esophagus.      One cratered small distal esophageal ulcer with stigmata of recent       bleeding was found. Which had      flat black material which could not be washed off or made to bleed with       washing possibly from feeding tube trauma possibly Mallory-Weiss tear or       reflux ulcer      The entire examined stomach was normal.  The duodenal bulb, first portion of the duodenum, second portion of the       duodenum and third portion of the duodenum were normal.      The exam was otherwise without abnormality. Impression:               - Medium-sized hiatal hernia.                           - Esophageal mucosal changes suspicious for                            short-segment Barrett's esophagus.                           - Esophageal ulcer with stigmata of recent bleeding.                           - Normal stomach.                           - Normal duodenal bulb, first portion of the                            duodenum, second portion of the duodenum and third                            portion of the duodenum.                            - The examination was otherwise normal.                           - No specimens collected. Recommendation:           - Clear liquid diet today. May slowly advance as                            tolerated if no signs of further bleeding                           - Continue present medications. Okay to resume                            Plavix if absolutely needed in 2 days                           - Return to GI clinic in 4 weeks. Probably should                            have a repeat endoscopy in 2 to 3 months to                            document healing and if the signs of Barrett's as  above would biopsy at that time and could                            coordinate with screening colonoscopy as well-                            Telephone GI clinic if symptomatic PRN. Procedure Code(s):        --- Professional ---                           [phone], Esophagogastroduodenoscopy, flexible,                            transoral; diagnostic, including collection of                            specimen(s) by brushing or washing, when performed                            (separate procedure) Diagnosis Code(s):        --- Professional ---                           K44.9, Diaphragmatic hernia without obstruction or                            gangrene                           K22.8, Other specified diseases of esophagus                           K22.11, Ulcer of esophagus with bleeding                           D62, Acute posthemorrhagic anemia                           K92.0, Hematemesis CPT copyright 2019 [employer]. All rights reserved. The codes documented in this report are preliminary and upon coder review may  be revised to meet current compliance requirements. Vida Rigger, MD 08/16/2019 1:39:15 PM This report has been signed electronically. Number of Addenda: 0

## 2019-08-16 NOTE — Anesthesia Preprocedure Evaluation (Addendum)
Anesthesia Evaluation  Patient identified by MRN, date of birth, ID band Patient awake    Reviewed: Allergy & Precautions, NPO status , Patient's Chart, lab work & pertinent test results  Airway Mallampati: II  TM Distance: >3 FB Neck ROM: Full    Dental  (+) Dental Advisory Given   Pulmonary neg pulmonary ROS,    breath sounds clear to auscultation       Cardiovascular hypertension, Pt. on medications  Rhythm:Regular Rate:Normal     Neuro/Psych Seizures -,  CVA    GI/Hepatic Neg liver ROS, GI bleed   Endo/Other  negative endocrine ROS  Renal/GU negative Renal ROS     Musculoskeletal  (+) Arthritis ,   Abdominal   Peds  Hematology  (+) anemia ,   Anesthesia Other Findings   Reproductive/Obstetrics                            Anesthesia Physical Anesthesia Plan  ASA: III  Anesthesia Plan: MAC   Post-op Pain Management:    Induction: Intravenous  PONV Risk Score and Plan: 2 and Ondansetron, Propofol infusion and Treatment may vary due to age or medical condition  Airway Management Planned: Natural Airway and Nasal Cannula  Additional Equipment: Arterial line  Intra-op Plan:   Post-operative Plan:   Informed Consent: I have reviewed the patients History and Physical, chart, labs and discussed the procedure including the risks, benefits and alternatives for the proposed anesthesia with the patient or authorized representative who has indicated his/her understanding and acceptance.       Plan Discussed with:   Anesthesia Plan Comments:         Anesthesia Quick Evaluation

## 2019-08-16 NOTE — Transfer of Care (Signed)
Immediate Anesthesia Transfer of Care Note  Patient: Cindy [name]  Procedure(s) Performed: ESOPHAGOGASTRODUODENOSCOPY (EGD) (N/A )  Patient Location: PACU and Endoscopy Unit  Anesthesia Type:MAC  Level of Consciousness: awake and alert   Airway & Oxygen Therapy: Patient Spontanous Breathing  Post-op Assessment: Report given to RN and Post -op Vital signs reviewed and stable  Post vital signs: Reviewed and stable  Last Vitals:  Vitals Value Taken Time  BP 100/53 08/16/19 1331  Temp 36.6 C 08/16/19 1331  Pulse 106 08/16/19 1332  Resp 20 08/16/19 1332  SpO2 98 % 08/16/19 1332  Vitals shown include unvalidated device data.  Last Pain:  Vitals:   08/16/19 1331  TempSrc: Temporal  PainSc: 0-No pain         Complications: No apparent anesthesia complications

## 2019-08-16 NOTE — Consult Note (Signed)
Referring Provider: Dr. Dartha Lodge Primary Care Physician:  Avon Gully, MD Primary Gastroenterologist: Gentry Fitz  Reason for Consultation: Hematemesis  HPI: Cindy [name] is a 59 y.o. female with past medical history of polysubstance abuse, hypertension, CVA (2005), and hyperlipidemia currently hospitalized for acute cerebral infarcts (likely embolic) presenting with hematemesis.  Patient was started on Plavix and aspirin for CVA and Lovenox for DVT prophylaxis.  Yesterday, she was noted to have 1 episode of hematemesis with clots.  Today, her hemoglobin was 8.4, decreased from 10.1 yesterday.  Her last dose of Plavix and Lovenox was yesterday.  She has not had any further episodes of hematemesis.  Patient was slightly confused, stating she ate a hot dog this morning; however, RN stated patient has been n.p.o. since last night.  RN further noted that patient is on a dysphagia diet and would not be able to consume hot dog and that she has not had any food this morning, as nurse has been assisting patient with eating.  Patient had a tray delivered but had not had any food off of it.  Per RN, patient has not had a bowel movement overnight.  Per documentation, it appears she had one episode of fecal incontinence yesterday, though color was not documented.  No reports of hematochezia or melena.  Patient denies seeing black or red stools.    Patient denies any abdominal pain, difficulty swallowing, heartburn, chest pain, shortness of breath, changes in bowel habits, decreased appetite, or unintentional weight loss.  Patient denies prior gastrointestinal bleeding.  She has never had an EGD or colonoscopy.  She believes her grandfather had colon cancer (diagnosed in 38s).  Patient uses alcohol and cocaine.  She denies frequent NSAID use, stating she uses Aleve infrequently.   Past Medical History:  Diagnosis Date  . Abnormal LFTs    Hx of  . CVA (cerebral vascular accident) (HCC) 2005  . Hyperlipidemia    . Hypertension   . Polysubstance dependence including opioid type drug, episodic abuse (HCC)    cocaine/etoh     History reviewed. No pertinent surgical history.  Prior to Admission medications   Medication Sig Start Date End Date Taking? Authorizing Provider  acetaminophen (TYLENOL) 500 MG tablet Take 1,000 mg by mouth every 6 (six) hours as needed for mild pain or headache.   Yes [provider]  amLODipine (NORVASC) 10 MG tablet TAKE 1 TABLET BY MOUTH EVERY DAY (NEEDS TO MAKE AN APPOINTMENT) Patient not taking: Reported on 08/07/2019 04/12/18   Adline Potter, NP  simvastatin (ZOCOR) 20 MG tablet Take 1 tablet (20 mg total) by mouth daily. Patient not taking: Reported on 08/07/2019 04/02/15   Cyril Mourning A, NP    Scheduled Meds: . chlorhexidine  15 mL Mouth Rinse BID  . Chlorhexidine Gluconate Cloth  6 each Topical Q0600  . insulin aspart  0-9 Units Subcutaneous Q4H  . levETIRAcetam  500 mg Oral Daily  . mouth rinse  15 mL Mouth Rinse q12n4p  . multivitamin  15 mL Per Tube Daily  . [START ON 08/19/2019] pantoprazole  40 mg Intravenous Q12H  . QUEtiapine  25 mg Oral QHS  . simvastatin  20 mg Oral q1800   Continuous Infusions: . sodium chloride Stopped (08/08/19 1952)  . octreotide  (SANDOSTATIN)    IV infusion 50 mcg/hr (08/15/19 2258)  . pantoprozole (PROTONIX) infusion 8 mg/hr (08/15/19 2042)   PRN Meds:.bisacodyl, hydrALAZINE, lip balm, LORazepam, metoprolol tartrate, Resource ThickenUp Clear, sennosides  Allergies as of 08/05/2019  . (  No Known Allergies)    Family History  Problem Relation Age of Onset  . Cancer Father        prostate   . Hypertension Mother   . Asthma Mother   . Bronchitis Mother     Social History   Socioeconomic History  . Marital status: Married    Spouse name: Not on file  . Number of children: Not on file  . Years of education: Not on file  . Highest education level: Not on file  Occupational History  . Not on  file  Tobacco Use  . Smoking status: Never Smoker  . Smokeless tobacco: Never Used  Substance and Sexual Activity  . Alcohol use: Yes    Comment: Drinks three 40 ounces beers 3 days per week , other days none to one 40 ounce beer per day  . Drug use: No    Types: Cocaine    Comment: not now  . Sexual activity: Yes    Birth control/protection: Post-menopausal  Other Topics Concern  . Not on file  Social History Narrative  . Not on file   Social Determinants of Health   Financial Resource Strain:   . Difficulty of Paying Living Expenses:   Food Insecurity:   . Worried About Programme researcher, broadcasting/film/video in the Last Year:   . Barista in the Last Year:   Transportation Needs:   . Freight forwarder (Medical):   Marland Kitchen Lack of Transportation (Non-Medical):   Physical Activity:   . Days of Exercise per Week:   . Minutes of Exercise per Session:   Stress:   . Feeling of Stress :   Social Connections:   . Frequency of Communication with Friends and Family:   . Frequency of Social Gatherings with Friends and Family:   . Attends Religious Services:   . Active Member of Clubs or Organizations:   . Attends Banker Meetings:   Marland Kitchen Marital Status:   Intimate Partner Violence:   . Fear of Current or Ex-Partner:   . Emotionally Abused:   Marland Kitchen Physically Abused:   . Sexually Abused:     Review of Systems: All negative except as stated above in HPI.  Physical Exam: Vital signs: Vitals:   08/16/19 0414 08/16/19 0738  BP: 121/73 107/62  Pulse: (!) 103 (!) 103  Resp: 18 18  Temp: 99.1 F (37.3 C) 99.3 F (37.4 C)  SpO2: 100% 97%   Last BM Date: 08/13/19 General:   Sleepy,  Well-developed, well-nourished, pleasant and cooperative in NAD Head: normocephalic, atraumatic Eyes: anicteric sclera Lungs:  Clear throughout to auscultation.   No wheezes, crackles, or rhonchi. No acute distress. Heart:  Regular rate and rhythm; no murmurs, clicks, rubs,  or gallops. Abdomen:  Soft, mild periumbilical tenderness, normoactive bowel sounds.  No guarding or peritoneal signs. Rectal:  Deferred Neuro: Sleeping but arouses easily to voice.  Oriented to location and date.  Not oriented to president (reported as Danae Orleans). Psych: Mood and affect appropriate. Ext: no edema  GI:  Lab Results: Recent Labs    08/15/19 1900 08/16/19 0043 08/16/19 0603  HGB 10.1* 8.3* 8.4*  HCT 29.6* 24.5* 24.9*   BMET Recent Labs    08/16/19 0043  NA 129*  K 5.0  CL 99  CO2 21*  GLUCOSE 144*  BUN 36*  CREATININE 1.18*  CALCIUM 8.6*   LFT Recent Labs    08/16/19 0043  ALBUMIN 2.5*   PT/INR No results  for input(s): LABPROT, INR in the last 72 hours.   Studies/Results: VAS [place] TRANSCRANIAL DOPPLER  Result Date: 08/15/2019  Transcranial Doppler Indications: Stroke. Limitations for diagnostic windows: Unable to insonate right transtemporal window. Unable to insonate left transtemporal window. Comparison Study: No prior studies. Performing Technologist: Oliver Hum RVT  Examination Guidelines: A complete evaluation includes B-mode imaging, spectral Doppler, color Doppler, and power Doppler as needed of all accessible portions of each vessel. Bilateral testing is considered an integral part of a complete examination. Limited examinations for reoccurring indications may be performed as noted.  +----------+-------------+----------+-----------+-------+ RIGHT TCD Right VM (cm)Depth (cm)PulsatilityComment +----------+-------------+----------+-----------+-------+ Opthalmic     23.00                 1.55            +----------+-------------+----------+-----------+-------+ ICA siphon    21.00                 1.48            +----------+-------------+----------+-----------+-------+ Vertebral    -22.00                 1.41            +----------+-------------+----------+-----------+-------+ Distal ICA    64.00                 0.72             +----------+-------------+----------+-----------+-------+  +----------+------------+----------+-----------+-------+ LEFT TCD  Left VM (cm)Depth (cm)PulsatilityComment +----------+------------+----------+-----------+-------+ Opthalmic    25.00                 1.51            +----------+------------+----------+-----------+-------+ ICA siphon   20.00                 1.46            +----------+------------+----------+-----------+-------+ Vertebral    -12.00                1.36            +----------+------------+----------+-----------+-------+ Distal ICA   19.00                 0.77            +----------+------------+----------+-----------+-------+  +------------+-------+-------+             VM cm/sComment +------------+-------+-------+ Prox Basilar-25.00  1.12   +------------+-------+-------+ Dist Basilar-21.00  1.18   +------------+-------+-------+    Preliminary     Impression/Plan: Hematemesis in the setting of Plavix and aspirin use for recent CVA.  Hemoglobin decreased to 8.4 today from 10.1 yesterday.  Patient is currently stable without continued bleeding.  We will plan for EGD today to further assess bleeding and risk of resumption of Plavix and aspirin.  Procedure, risks, benefits of EGD thoroughly discussed with both patient and patient's husband.  Both patient and patient's husband are agreeable to proceeding with the procedure today and they were given the opportunity ask any questions.  Continue to monitor H&H.  Transfuse as needed.  Continue Protonix.  Patient will need an outpatient screening colonoscopy at some point in time.   LOS: 11 days   Salley Slaughter  08/16/2019, 9:17 AM  Questions please call [phone]

## 2019-08-16 NOTE — Anesthesia Procedure Notes (Signed)
Procedure Name: MAC Date/Time: 08/16/2019 1:12 PM Performed by: Inda Coke, CRNA Pre-anesthesia Checklist: Patient identified, Emergency Drugs available, Suction available, Timeout performed and Patient being monitored Patient Re-evaluated:Patient Re-evaluated prior to induction Oxygen Delivery Method: Nasal cannula Induction Type: IV induction Dental Injury: Teeth and Oropharynx as per pre-operative assessment

## 2019-08-16 NOTE — Progress Notes (Signed)
PROGRESS NOTE    Cindy [name]  AVW:[id number] DOB: 04-28-1961 DOA: 08/05/2019 PCP: Avon Gully, MD   Brief Narrative:  59 year old with a history of CVA in 2005, HTN, HLD, polysubstance abuse, alcohol use admitted for having seizure upon admission she was intubated.  She self extubated on 3/22.  CT of the head was negative for any acute pathology.  EEG was negative for any active seizures.  MRI brain on 3/24 was suggestive of small acute multifocal CVA, carotid Dopplers showed bilateral ICA stenosis 1/39%.  Lower extremity Dopplers was negative for DVT.  Briefly received 2 days of azithromycin and Rocephin   Assessment & Plan:   Active Problems:   Status epilepticus (HCC)   Acute encephalopathy   Cerebral embolism with cerebral infarction  Acute metabolic encephalopathy with active seizures Acute multifocal CVA suspect embolic -CVA noted on MRI of the brain.  Underwent bilateral carotid Dopplers-1-39% stenosis.  Seen by neurology team. -EEG-slightly abnormal but no seizure activity noted -Echocardiogram-60-65% -A1c 5.1, LDL 46.  UDS positive for cocaine -Aspirin and Plavix held -Continue Keppra 500 mg daily  Upper GI bleeding/coffee-ground emesis -Likely from esophageal ulcer.  Advance diet as tolerated.  Status post endoscopy 3/30-hold off on Plavix for at least 48 hours and then slowly resume it. -We will need repeat endoscopy in 2-3 months due to concerns of Barrett's esophagitis. -Discontinue octreotide  Alcohol use Illicit drug use -Counseled to quit using alcohol and illicit drugs.  No longer showing any signs of alcohol withdrawal, last drink about 10 days ago.  Advised to quit drinking this.  Essential hypertension -Due to active bleeding soft blood pressure her amlodipine and clonidine were discontinued this morning, I can slowly resume an appropriate  Hyperlipidemia -On Zocor  PT is recommending CIR Speech recommends dysphagia 1 diet  DVT prophylaxis: None  due to GI bleeding Code Status: Full code none Family Communication:   Disposition Plan:   Patient From= home  Patient Anticipated D/C place= CIR  Barriers= once bleeding is subsided, transition patient to CIR   Subjective: Bleeding appears to have subsided this morning, hemoglobin has drifted down to 8.4.  Continue to monitor.  Review of Systems Otherwise negative except as per HPI, including: General: Denies fever, chills, night sweats or unintended weight loss. Resp: Denies cough, wheezing, shortness of breath. Cardiac: Denies chest pain, palpitations, orthopnea, paroxysmal nocturnal dyspnea. GI: Denies abdominal pain, nausea, vomiting, diarrhea or constipation GU: Denies dysuria, frequency, hesitancy or incontinence MS: Denies muscle aches, joint pain or swelling Neuro: Denies headache, neurologic deficits (focal weakness, numbness, tingling), abnormal gait Psych: Denies anxiety, depression, SI/HI/AVH Skin: Denies new rashes or lesions ID: Denies sick contacts, exotic exposures, travel  Examination:  General exam: Appears calm and comfortable  Respiratory system: Clear to auscultation. Respiratory effort normal. Cardiovascular system: S1 & S2 heard, RRR. No JVD, murmurs, rubs, gallops or clicks. No pedal edema. Gastrointestinal system: Abdomen is nondistended, soft and nontender. No organomegaly or masses felt. Normal bowel sounds heard. Central nervous system: Alert and oriented. No focal neurological deficits. Extremities: Symmetric 5 x 5 power. Skin: No rashes, lesions or ulcers Psychiatry: Judgement and insight appear normal. Mood & affect appropriate.     Objective: Vitals:   08/16/19 1205 08/16/19 1331 08/16/19 1339 08/16/19 1350  BP: (!) 156/67 (!) 100/53 120/70 103/88  Pulse:  (!) 102 (!) 101 100  Resp: (!) 21 (!) 22 (!) 24 (!) 23  Temp: 98.6 F (37 C) 97.9 F (36.6 C)    TempSrc:  Axillary Temporal    SpO2: 97% 99% 98% 96%  Weight:      Height:         Intake/Output Summary (Last 24 hours) at 08/16/2019 1421 Last data filed at 08/16/2019 1323 Gross per 24 hour  Intake 450 ml  Output 0 ml  Net 450 ml   Filed Weights   08/11/19 0307 08/12/19 0500 08/14/19 0343  Weight: 58.3 kg 58.2 kg 73.9 kg     Data Reviewed:   CBC: Recent Labs  Lab 08/10/19 0356 08/10/19 0356 08/11/19 0301 08/12/19 0240 08/15/19 1900 08/16/19 0043 08/16/19 0603  WBC 8.3  --  6.0 7.4  --   --   --   HGB 12.4   < > 11.4* 11.7* 10.1* 8.3* 8.4*  HCT 35.4*   < > 33.5* 34.5* 29.6* 24.5* 24.9*  MCV 94.1  --  95.4 96.1  --   --   --   PLT 237  --  229 277  --   --   --    < > = values in this interval not displayed.   Basic Metabolic Panel: Recent Labs  Lab 08/10/19 0356 08/11/19 0301 08/12/19 0240 08/13/19 0519 08/16/19 0043  NA 131* 132* 131* 130* 129*  K 3.3* 4.8 4.6 4.5 5.0  CL 97* 100 98 98 99  CO2 24 25 21* 20* 21*  GLUCOSE 143* 135* 136* 133* 144*  BUN 7 16 17 13  36*  CREATININE 0.82 0.74 0.84 0.99 1.18*  CALCIUM 8.7* 8.6* 8.7* 9.3 8.6*  PHOS  --   --   --   --  6.2*   GFR: Estimated Creatinine Clearance: 49.9 mL/min (A) (by C-G formula based on SCr of 1.18 mg/dL (H)). Liver Function Tests: Recent Labs  Lab 08/16/19 0043  ALBUMIN 2.5*   No results for input(s): LIPASE, AMYLASE in the last 168 hours. No results for input(s): AMMONIA in the last 168 hours. Coagulation Profile: No results for input(s): INR, PROTIME in the last 168 hours. Cardiac Enzymes: No results for input(s): CKTOTAL, CKMB, CKMBINDEX, TROPONINI in the last 168 hours. BNP (last 3 results) No results for input(s): PROBNP in the last 8760 hours. HbA1C: No results for input(s): HGBA1C in the last 72 hours. CBG: Recent Labs  Lab 08/15/19 1637 08/15/19 2047 08/16/19 0409 08/16/19 0740 08/16/19 1153  GLUCAP 114* 110* 110* 111* 123*   Lipid Profile: No results for input(s): CHOL, HDL, LDLCALC, TRIG, CHOLHDL, LDLDIRECT in the last 72 hours. Thyroid Function  Tests: No results for input(s): TSH, T4TOTAL, FREET4, T3FREE, THYROIDAB in the last 72 hours. Anemia Panel: No results for input(s): VITAMINB12, FOLATE, FERRITIN, TIBC, IRON, RETICCTPCT in the last 72 hours. Sepsis Labs: No results for input(s): PROCALCITON, LATICACIDVEN in the last 168 hours.  Recent Results (from the past 240 hour(s))  Culture, blood (Routine X 2) w Reflex to ID Panel     Status: None   Collection Time: 08/11/19  9:40 AM   Specimen: BLOOD  Result Value Ref Range Status   Specimen Description BLOOD LEFT ANTECUBITAL  Final   Special Requests   Final    BOTTLES DRAWN AEROBIC ONLY Blood Culture results may not be optimal due to an inadequate volume of blood received in culture bottles   Culture   Final    NO GROWTH 5 DAYS Performed at [facility], [place] [address] [place], [place] [address]    Report Status 08/16/2019 FINAL  Final  Culture, blood (Routine X 2)  w Reflex to ID Panel     Status: None   Collection Time: 08/11/19  9:53 AM   Specimen: BLOOD LEFT HAND  Result Value Ref Range Status   Specimen Description BLOOD LEFT HAND  Final   Special Requests   Final    BOTTLES DRAWN AEROBIC AND ANAEROBIC Blood Culture adequate volume   Culture   Final    NO GROWTH 5 DAYS Performed at [facility], [address], [place], [place] [address]    Report Status 08/16/2019 FINAL  Final         Radiology Studies: VAS [place] TRANSCRANIAL DOPPLER  Result Date: 08/16/2019  Transcranial Doppler Indications: Stroke. Limitations for diagnostic windows: Unable to insonate right transtemporal window. Unable to insonate left transtemporal window. Comparison Study: No prior studies. Performing Technologist: Chanda Busing RVT  Examination Guidelines: A complete evaluation includes B-mode imaging, spectral Doppler, color Doppler, and power Doppler as needed of all accessible portions of each vessel. Bilateral testing is considered an integral part of a complete  examination. Limited examinations for reoccurring indications may be performed as noted.  +----------+-------------+----------+-----------+-------+ RIGHT TCD Right VM (cm)Depth (cm)PulsatilityComment +----------+-------------+----------+-----------+-------+ Opthalmic     23.00                 1.55            +----------+-------------+----------+-----------+-------+ ICA siphon    21.00                 1.48            +----------+-------------+----------+-----------+-------+ Vertebral    -22.00                 1.41            +----------+-------------+----------+-----------+-------+ Distal ICA    64.00                 0.72            +----------+-------------+----------+-----------+-------+  +----------+------------+----------+-----------+-------+ LEFT TCD  Left VM (cm)Depth (cm)PulsatilityComment +----------+------------+----------+-----------+-------+ Opthalmic    25.00                 1.51            +----------+------------+----------+-----------+-------+ ICA siphon   20.00                 1.46            +----------+------------+----------+-----------+-------+ Vertebral    -12.00                1.36            +----------+------------+----------+-----------+-------+ Distal ICA   19.00                 0.77            +----------+------------+----------+-----------+-------+  +------------+-------+-------+             VM cm/sComment +------------+-------+-------+ Prox Basilar-25.00  1.12   +------------+-------+-------+ Dist Basilar-21.00  1.18   +------------+-------+-------+ Summary:  Absent bitemporal windows limits exam. Normal mean flow velocities in both opthalmics,carotid siphons,and vertebral and basilar arteries. *See table(s) above for TCD measurements and observations.  Diagnosing physician: Delia Heady MD Electronically signed by Delia Heady MD on 08/16/2019 at 12:38:03 PM.    Final         Scheduled Meds: . chlorhexidine   15 mL Mouth Rinse BID  . Chlorhexidine Gluconate Cloth  6 each Topical Q0600  . insulin aspart  0-9 Units Subcutaneous Q4H  .  levETIRAcetam  500 mg Oral Daily  . mouth rinse  15 mL Mouth Rinse q12n4p  . multivitamin  15 mL Per Tube Daily  . [START ON 08/19/2019] pantoprazole  40 mg Intravenous Q12H  . QUEtiapine  25 mg Oral QHS  . simvastatin  20 mg Oral q1800   Continuous Infusions: . sodium chloride Stopped (08/08/19 1952)  . octreotide  (SANDOSTATIN)    IV infusion 50 mcg/hr (08/16/19 0954)     LOS: 11 days   Time spent= 25 mins    Lunah Losasso Joline Maxcy, MD [facility]  If 7PM-7AM, please contact night-coverage  08/16/2019, 2:21 PM

## 2019-08-17 LAB — BASIC METABOLIC PANEL
Anion gap: 8 (ref 5–15)
BUN: 33 mg/dL — ABNORMAL HIGH (ref 6–20)
CO2: 19 mmol/L — ABNORMAL LOW (ref 22–32)
Calcium: 8.4 mg/dL — ABNORMAL LOW (ref 8.9–10.3)
Chloride: 102 mmol/L (ref 98–111)
Creatinine, Ser: 1.19 mg/dL — ABNORMAL HIGH (ref 0.44–1.00)
GFR calc Af Amer: 57 mL/min — ABNORMAL LOW (ref >60)
GFR calc non Af Amer: 50 mL/min — ABNORMAL LOW (ref >60)
Glucose, Bld: 123 mg/dL — ABNORMAL HIGH (ref 70–99)
Potassium: 3.9 mmol/L (ref 3.5–5.1)
Sodium: 129 mmol/L — ABNORMAL LOW (ref 135–145)

## 2019-08-17 LAB — CBC
HCT: 21.5 % — ABNORMAL LOW (ref 36.0–46.0)
Hemoglobin: 7.1 g/dL — ABNORMAL LOW (ref 12.0–15.0)
MCH: 32.1 pg (ref 26.0–34.0)
MCHC: 33 g/dL (ref 30.0–36.0)
MCV: 97.3 fL (ref 80.0–100.0)
Platelets: 368 10*3/uL (ref 150–400)
RBC: 2.21 MIL/uL — ABNORMAL LOW (ref 3.87–5.11)
RDW: 12.3 % (ref 11.5–15.5)
WBC: 6.5 10*3/uL (ref 4.0–10.5)
nRBC: 0 % (ref 0.0–0.2)

## 2019-08-17 LAB — GLUCOSE, CAPILLARY
Glucose-Capillary: 106 mg/dL — ABNORMAL HIGH (ref 70–99)
Glucose-Capillary: 109 mg/dL — ABNORMAL HIGH (ref 70–99)
Glucose-Capillary: 109 mg/dL — ABNORMAL HIGH (ref 70–99)
Glucose-Capillary: 115 mg/dL — ABNORMAL HIGH (ref 70–99)
Glucose-Capillary: 131 mg/dL — ABNORMAL HIGH (ref 70–99)
Glucose-Capillary: 155 mg/dL — ABNORMAL HIGH (ref 70–99)
Glucose-Capillary: 99 mg/dL (ref 70–99)

## 2019-08-17 LAB — MAGNESIUM: Magnesium: 1.8 mg/dL (ref 1.7–2.4)

## 2019-08-17 LAB — PREPARE RBC (CROSSMATCH)

## 2019-08-17 MED ORDER — SODIUM CHLORIDE 0.9% IV SOLUTION: Freq: Once | INTRAVENOUS | Status: AC

## 2019-08-17 MED ORDER — PANTOPRAZOLE SODIUM 40 MG PO TBEC
40.0000 mg | DELAYED_RELEASE_TABLET | Freq: Two times a day (BID) | ORAL | Status: DC
  Administered 2019-08-17 – 2019-08-19 (×4): 40 mg via ORAL
  Filled 2019-08-17 (×4): qty 1

## 2019-08-17 NOTE — Progress Notes (Signed)
PROGRESS NOTE    Cindy [name]  JJO:[id number] DOB: [birth year] DOA: 08/05/2019 PCP: Avon Gully, MD   Brief Narrative:  59 year old with a history of CVA in 2005, HTN, HLD, polysubstance abuse, alcohol use admitted for having seizure upon admission she was intubated.  She self extubated on 3/22.  CT of the head was negative for any acute pathology.  EEG was negative for any active seizures.  MRI brain on 3/24 was suggestive of small acute multifocal CVA, carotid Dopplers showed bilateral ICA stenosis 1/39%.  Lower extremity Dopplers was negative for DVT.  Briefly received 2 days of azithromycin and Rocephin.  Endoscopy showed esophageal ulcer therefore started on PPI.   Assessment & Plan:   Active Problems:   Status epilepticus (HCC)   Acute encephalopathy   Cerebral embolism with cerebral infarction  Acute metabolic encephalopathy with active seizures, stable Acute multifocal CVA suspect embolic -CVA noted on MRI of the brain.  Underwent bilateral carotid Dopplers-1-39% stenosis.  Seen by neurology team. -EEG-slightly abnormal but no seizure activity noted -Echocardiogram-60-65% -A1c 5.1, LDL 46.  UDS positive for cocaine -Aspirin and Plavix held-Plavix will be on hold until 08/19/2019. -Continue Keppra 500 mg daily  Upper GI bleeding/coffee-ground emesis Acute blood loss anemia -Likely from esophageal ulcer.  Advance diet as tolerated.  Status post endoscopy 3/30-hold off on Plavix for at least 48 hours and then slowly resume it. -We will need repeat endoscopy in 2-3 months due to concerns of Barrett's esophagitis. -Discontinue octreotide -Hemoglobin dropped to 7.1, transfuse 1 unit PRBC  Alcohol use Illicit drug use -Counseled to quit using alcohol and illicit drugs.  No longer showing any signs of alcohol withdrawal, last drink about 10 days ago.  Advised to quit drinking this.  Essential hypertension -Due to active bleeding soft blood pressure her amlodipine and clonidine  were discontinued this morning, I can slowly resume an appropriate  Hyperlipidemia -On Zocor  PT is recommending CIR Speech recommends dysphagia 1 diet  DVT prophylaxis: None due to GI bleeding Code Status: Full code none Family Communication:   Disposition Plan:   Patient From= home  Patient Anticipated D/C place= CIR  Barriers= patient receiving 1 unit PRBC to ensure she no longer is having any further bleeding.  Once hemoglobin has remained stable we will transition her to CIR, hopefully over next 24 hours.  Subjective: No further bleeding overnight.  Overall feels okay. Still having dark stools  Review of Systems Otherwise negative except as per HPI, including: General = no fevers, chills, dizziness,  fatigue HEENT/EYES = negative for loss of vision, double vision, blurred vision,  sore throa Cardiovascular= negative for chest pain, palpitation Respiratory/lungs= negative for shortness of breath, cough, wheezing; hemoptysis,  Gastrointestinal= negative for nausea, vomiting, abdominal pain Genitourinary= negative for Dysuria MSK = Negative for arthralgia, myalgias Neurology= Negative for headache, numbness, tingling  Psychiatry= Negative for suicidal and homocidal ideation Skin= Negative for Rash\   Examination: Constitutional: Not in acute distress Respiratory: Clear to auscultation bilaterally Cardiovascular: Normal sinus rhythm, no rubs Abdomen: Nontender nondistended good bowel sounds Musculoskeletal: No edema noted Skin: No rashes seen Neurologic: CN 2-12 grossly intact.  And nonfocal Psychiatric: Normal judgment and insight. Alert and oriented x 3. Normal mood.    Objective: Vitals:   08/17/19 0411 08/17/19 0417 08/17/19 0731 08/17/19 1126  BP: 125/63  128/84 122/76  Pulse: (!) 103  (!) 108 97  Resp: 16  18 18   Temp: 98.7 F (37.1 C)  98.8 F (37.1 C) 98.6  F (37 C)  TempSrc: Oral  Oral Oral  SpO2: 98%  100% 97%  Weight:  73.9 kg    Height:         Intake/Output Summary (Last 24 hours) at 08/17/2019 1156 Last data filed at 08/17/2019 0317 Gross per 24 hour  Intake 200 ml  Output 775 ml  Net -575 ml   Filed Weights   08/12/19 0500 08/14/19 0343 08/17/19 0417  Weight: 58.2 kg 73.9 kg 73.9 kg     Data Reviewed:   CBC: Recent Labs  Lab 08/11/19 0301 08/11/19 0301 08/12/19 0240 08/12/19 0240 08/15/19 1900 08/16/19 0043 08/16/19 0603 08/16/19 1550 08/17/19 0335  WBC 6.0  --  7.4  --   --   --   --   --  6.5  HGB 11.4*   < > 11.7*   < > 10.1* 8.3* 8.4* 7.4* 7.1*  HCT 33.5*   < > 34.5*   < > 29.6* 24.5* 24.9* 21.9* 21.5*  MCV 95.4  --  96.1  --   --   --   --   --  97.3  PLT 229  --  277  --   --   --   --   --  368   < > = values in this interval not displayed.   Basic Metabolic Panel: Recent Labs  Lab 08/11/19 0301 08/12/19 0240 08/13/19 0519 08/16/19 0043 08/17/19 0335  NA 132* 131* 130* 129* 129*  K 4.8 4.6 4.5 5.0 3.9  CL 100 98 98 99 102  CO2 25 21* 20* 21* 19*  GLUCOSE 135* 136* 133* 144* 123*  BUN 16 17 13  36* 33*  CREATININE 0.74 0.84 0.99 1.18* 1.19*  CALCIUM 8.6* 8.7* 9.3 8.6* 8.4*  MG  --   --   --   --  1.8  PHOS  --   --   --  6.2*  --    GFR: Estimated Creatinine Clearance: 49.5 mL/min (A) (by C-G formula based on SCr of 1.19 mg/dL (H)). Liver Function Tests: Recent Labs  Lab 08/16/19 0043  ALBUMIN 2.5*   No results for input(s): LIPASE, AMYLASE in the last 168 hours. No results for input(s): AMMONIA in the last 168 hours. Coagulation Profile: No results for input(s): INR, PROTIME in the last 168 hours. Cardiac Enzymes: No results for input(s): CKTOTAL, CKMB, CKMBINDEX, TROPONINI in the last 168 hours. BNP (last 3 results) No results for input(s): PROBNP in the last 8760 hours. HbA1C: No results for input(s): HGBA1C in the last 72 hours. CBG: Recent Labs  Lab 08/16/19 2225 08/17/19 0003 08/17/19 0409 08/17/19 0725 08/17/19 0826  GLUCAP 114* 109* 115* 109* 155*   Lipid  Profile: No results for input(s): CHOL, HDL, LDLCALC, TRIG, CHOLHDL, LDLDIRECT in the last 72 hours. Thyroid Function Tests: No results for input(s): TSH, T4TOTAL, FREET4, T3FREE, THYROIDAB in the last 72 hours. Anemia Panel: No results for input(s): VITAMINB12, FOLATE, FERRITIN, TIBC, IRON, RETICCTPCT in the last 72 hours. Sepsis Labs: No results for input(s): PROCALCITON, LATICACIDVEN in the last 168 hours.  Recent Results (from the past 240 hour(s))  Culture, blood (Routine X 2) w Reflex to ID Panel     Status: None   Collection Time: 08/11/19  9:40 AM   Specimen: BLOOD  Result Value Ref Range Status   Specimen Description BLOOD LEFT ANTECUBITAL  Final   Special Requests   Final    BOTTLES DRAWN AEROBIC ONLY Blood Culture results may not be  optimal due to an inadequate volume of blood received in culture bottles   Culture   Final    NO GROWTH 5 DAYS Performed at [facility], [place] [address] [place], [place] [address]    Report Status 08/16/2019 FINAL  Final  Culture, blood (Routine X 2) w Reflex to ID Panel     Status: None   Collection Time: 08/11/19  9:53 AM   Specimen: BLOOD LEFT HAND  Result Value Ref Range Status   Specimen Description BLOOD LEFT HAND  Final   Special Requests   Final    BOTTLES DRAWN AEROBIC AND ANAEROBIC Blood Culture adequate volume   Culture   Final    NO GROWTH 5 DAYS Performed at [facility], [place] [address], [place]  [address]    Report Status 08/16/2019 FINAL  Final         Radiology Studies: No results found.      Scheduled Meds: . chlorhexidine  15 mL Mouth Rinse BID  . Chlorhexidine Gluconate Cloth  6 each Topical Q0600  . insulin aspart  0-9 Units Subcutaneous Q4H  . levETIRAcetam  500 mg Oral Daily  . mouth rinse  15 mL Mouth Rinse q12n4p  . multivitamin  15 mL Per Tube Daily  . [START ON 08/19/2019] pantoprazole  40 mg Intravenous Q12H  . QUEtiapine  25 mg Oral QHS  . simvastatin  20 mg Oral q1800    Continuous Infusions: . sodium chloride Stopped (08/08/19 1952)     LOS: 12 days   Time spent= 25 mins    Torin Whisner Arsenio Loader, MD [facility]  If 7PM-7AM, please contact night-coverage  08/17/2019, 11:56 AM

## 2019-08-17 NOTE — Plan of Care (Signed)
  Problem: Education: Goal: Knowledge of General Education information will improve Description: Including pain rating scale, medication(s)/side effects and non-pharmacologic comfort measures Outcome: Progressing   Problem: Activity: Goal: Risk for activity intolerance will decrease Outcome: Progressing   Problem: Nutrition: Goal: Adequate nutrition will be maintained Outcome: Progressing   Problem: Pain Managment: Goal: General experience of comfort will improve Outcome: Progressing   Problem: Safety: Goal: Ability to remain free from injury will improve Outcome: Progressing   Problem: Skin Integrity: Goal: Risk for impaired skin integrity will decrease Outcome: Progressing   Problem: Education: Goal: Expressions of having a comfortable level of knowledge regarding the disease process will increase Outcome: Progressing   Problem: Coping: Goal: Ability to adjust to condition or change in health will improve Outcome: Progressing Goal: Ability to identify appropriate support needs will improve Outcome: Progressing   Problem: Health Behavior/Discharge Planning: Goal: Compliance with prescribed medication regimen will improve Outcome: Progressing   Problem: Medication: Goal: Risk for medication side effects will decrease Outcome: Progressing   Problem: Clinical Measurements: Goal: Complications related to the disease process, condition or treatment will be avoided or minimized Outcome: Progressing Goal: Diagnostic test results will improve Outcome: Progressing   Problem: Safety: Goal: Verbalization of understanding the information provided will improve Outcome: Progressing   Problem: Self-Concept: Goal: Level of anxiety will decrease Outcome: Progressing Goal: Ability to verbalize feelings about condition will improve Outcome: Progressing   Problem: Education: Goal: Knowledge of disease or condition will improve Outcome: Progressing Goal: Knowledge of  secondary prevention will improve Outcome: Progressing Goal: Knowledge of patient specific risk factors addressed and post discharge goals established will improve Outcome: Progressing   Problem: Coping: Goal: Will verbalize positive feelings about self Outcome: Progressing Goal: Will identify appropriate support needs Outcome: Progressing   Problem: Health Behavior/Discharge Planning: Goal: Ability to manage health-related needs will improve Outcome: Progressing   Problem: Self-Care: Goal: Ability to participate in self-care as condition permits will improve Outcome: Progressing Goal: Verbalization of feelings and concerns over difficulty with self-care will improve Outcome: Progressing Goal: Ability to communicate needs accurately will improve Outcome: Progressing   Problem: Nutrition: Goal: Risk of aspiration will decrease Outcome: Progressing Goal: Dietary intake will improve Outcome: Progressing   Problem: Ischemic Stroke/TIA Tissue Perfusion: Goal: Complications of ischemic stroke/TIA will be minimized Outcome: Progressing

## 2019-08-17 NOTE — Progress Notes (Signed)
Nutrition Follow-up  DOCUMENTATION CODES:   Not applicable  INTERVENTION:  D/c calorie count  Continue Vital Cuisine shakes TID when diet is advanced from clear liquids, each supplement provides 500 kcal and 22 grams of protein.  NUTRITION DIAGNOSIS:   Increased nutrient needs related to acute illness as evidenced by estimated needs.  Ongoing.  GOAL:   Patient will meet greater than or equal to 90% of their needs  Not met.   MONITOR:   PO intake, Diet advancement, Supplement acceptance, Labs  REASON FOR ASSESSMENT:   Malnutrition Screening Tool    ASSESSMENT:   Pt with a PMH significant for CVA, HTN, HLD, and polysubstance abuse presented to the ED with acute metabolic encephalopathy and actively seizing  3/22 - pt self-extubated; Cortrak placed  3/26 - MBS, DYS1/Nectar 3/30 - s/p endoscopy; Cortrak removed   Noted that Cortrak was removed yesterday. Per MD, diet is to be advanced as tolerated. Pt has orders for Vital Cuisine shakes TID to continue once diet is advanced from clear liquids. RD will d/c calorie count as clear liquid diet is insufficient to meet calorie/protein needs.  Per MD, pt is to be transitioned to CIR.   PO Intake: 0-50% x 4 recorded meals  Medications reviewed and include: Novolog, MVI  Labs reviewed: Na 129 (L), CBGs 109-155  UOP: 76m x24 hours I/O: +11,7816msince admit  Diet Order:   Diet Order            Diet clear liquid Room service appropriate? Yes; Fluid consistency: Nectar Thick  Diet effective now              EDUCATION NEEDS:   Not appropriate for education at this time  Skin:  Skin Assessment: Reviewed RN Assessment  Last BM:  3/30  Height:   Ht Readings from Last 1 Encounters:  08/05/19 5' 4" (1.626 m)    Weight:   Wt Readings from Last 1 Encounters:  08/17/19 73.9 kg    BMI:  Body mass index is 27.98 kg/m.  Estimated Nutritional Needs:   Kcal:  1550-1750  Protein:  75-85 gm  Fluid:  >/=  1.5 L   AmLarkin InaMS, RD, LDN RD pager number and weekend/on-call pager number located in [facility]

## 2019-08-17 NOTE — Progress Notes (Signed)
Tolerated blood transfusion well without complications or side effects.  More alert today and following commands.  Has been calm and pleasantly confused today.  Brother has visited with her today, as well as her spouse.  She is in good spirits.  No complaints

## 2019-08-17 NOTE — Progress Notes (Addendum)
Hgb 7.1, Winters (IM) informed. Afterwards Craige Cotta informed

## 2019-08-17 NOTE — Progress Notes (Signed)
STROKE TEAM PROGRESS NOTE    )   INTERVAL HISTORY Patient had upper GI endoscopy performed yesterday by Dr. Marlow Baars which showed esophageal ulcer and Barrett's esophagus.  There was no active bleeding site noted.  Patient remains hemodynamically stable.  GI team physician assistant is in the room.  Neurologically unchanged.  Hemoglobin   is down to 7.1 today from 8.3 yesterday    OBJECTIVE Vitals:   08/17/19 0411 08/17/19 0417 08/17/19 0731 08/17/19 1126  BP: 125/63  128/84 122/76  Pulse: (!) 103  (!) 108 97  Resp: 16  18 18   Temp: 98.7 F (37.1 C)  98.8 F (37.1 C) 98.6 F (37 C)  TempSrc: Oral  Oral Oral  SpO2: 98%  100% 97%  Weight:  73.9 kg    Height:        CBC:  Recent Labs  Lab 08/12/19 0240 08/15/19 1900 08/16/19 1550 08/17/19 0335  WBC 7.4  --   --  6.5  HGB 11.7*   < > 7.4* 7.1*  HCT 34.5*   < > 21.9* 21.5*  MCV 96.1  --   --  97.3  PLT 277  --   --  368   < > = values in this interval not displayed.    Basic Metabolic Panel:  Recent Labs  Lab 08/16/19 0043 08/17/19 0335  NA 129* 129*  K 5.0 3.9  CL 99 102  CO2 21* 19*  GLUCOSE 144* 123*  BUN 36* 33*  CREATININE 1.18* 1.19*  CALCIUM 8.6* 8.4*  MG  --  1.8  PHOS 6.2*  --     Lipid Panel:     Component Value Date/Time   CHOL 102 08/11/2019 0301   CHOL 241 (H) 03/26/2015 0921   TRIG 31 08/11/2019 0301   HDL 50 08/11/2019 0301   HDL 40 03/26/2015 0921   CHOLHDL 2.0 08/11/2019 0301   VLDL 6 08/11/2019 0301   LDLCALC 46 08/11/2019 0301   LDLCALC 191 (H) 03/26/2015 0921   HgbA1c:  Lab Results  Component Value Date   HGBA1C 5.1 08/11/2019   Urine Drug Screen:     Component Value Date/Time   LABOPIA NONE DETECTED 08/05/2019 1225   COCAINSCRNUR POSITIVE (A) 08/05/2019 1225   LABBENZ NONE DETECTED 08/05/2019 1225   AMPHETMU NONE DETECTED 08/05/2019 1225   THCU NONE DETECTED 08/05/2019 1225   LABBARB NONE DETECTED 08/05/2019 1225    Alcohol Level     Component Value Date/Time   ETH  21 (H) 08/05/2019 1201    IMAGING  No results found. MRI Head WO Contrast 08/10/19 IMPRESSION: 1. Scattered small foci of restricted diffusion within the bilateral cerebral hemispheres, consistent with small acute infarcts, likely embolic. 2. Advanced white matter disease, more pronounced than expected for age. 3. Mild inflammatory paranasal sinus disease and bilateral mastoid effusions.  CT Head WO Contrast 08/10/19 IMPRESSION: 1. No acute intracranial pathology. 2. Extensive periventricular and deep white matter hypodensity markedly increased compared to remote prior CT dated 2006. Findings may reflect small-vessel white matter disease or alternately demyelinating disorder such as multiple sclerosis. Correlate with referable clinical history.  Transthoracic Echocardiogram  08/06/19 IMPRESSIONS  1. Left ventricular ejection fraction, by estimation, is 60 to 65%. The  left ventricle has normal function. The left ventricle has no regional  wall motion abnormalities. There is mild left ventricular hypertrophy.  Left ventricular diastolic parameters  are consistent with Grade I diastolic dysfunction (impaired relaxation).  2. Right ventricular systolic function is normal.  The right ventricular  size is normal.  3. The mitral valve is grossly normal. Mild mitral valve regurgitation.  4. The aortic valve is tricuspid. Aortic valve regurgitation is not  visualized.   Bilateral Carotid Dopplers  08/11/19 Summary:  Right Carotid: Velocities in the right ICA are consistent with a 1-39% stenosis.  Left Carotid: Velocities in the left ICA are consistent with a 1-39% stenosis.  Vertebrals: Bilateral vertebral arteries demonstrate antegrade flow.  Subclavians: Normal flow hemodynamics were seen in bilateral subclavian arteries.   ECG - ST rate 107 BPM. (See cardiology reading for complete details)  EEG  08/07/19 IMPRESSION: This is an abnormal EEG secondary to general background  slowing.  This finding may be seen with a diffuse disturbance that is etiologically nonspecific, but may include a metabolic encephalopathy or post-ictal state, among other possibilities.  The fast beta activity noted likely represents a medication effect.  No epileptiform activity is noted.     PHYSICAL EXAM Blood pressure 122/76, pulse 97, temperature 98.6 F (37 C), temperature source Oral, resp. rate 18, height 5\' 4"  (1.626 m), weight 73.9 kg, SpO2 97 %.  Pleasant middle-aged Caucasian lady not in distress  Afebrile. Head is nontraumatic. Neck is supple without bruit.    Cardiac exam no murmur or gallop. Lungs are clear to auscultation. Distal pulses are well felt. Neurological Exam : Awake  Alert oriented x 3.  Slightly dysarthric speech.  No aphasia.  Eye movements full without nystagmus.fundi were not visualized. Vision acuity and fields appear normal. Hearing is normal. Palatal movements are normal. Face symmetric. Tongue midline. Normal strength, tone, reflexes and coordination. Normal sensation. Gait deferred.  ASSESSMENT/PLAN Cindy [name] is a 59 y.o. female with history of polysubstance abuse, HTN, HLD, CVA (2005) who presented to [facility] for AMS/ seizures and weakness. She did not receive IV t-PA due to late presentation (>4.5 hours from time of onset)   Stroke:   Resultant  Seizure and multiple infarcts  Code Stroke CT Head - not ordered  CT head - 08/10/19- no acute findings.  Extensive periventricular and deep white matter hypodensity markedly increased compared to remote prior CT dated 2006. Findings may reflect small-vessel white matter disease or alternately demyelinating disorder such as multiple sclerosis.   MRI head - Scattered small foci of restricted diffusion within the bilateral cerebral hemispheres, consistent with small acute infarcts, likely embolic.  MRA head - not ordered  CTA H&N - not ordered  CT Perfusion - not ordered  EEG - This is an abnormal EEG  secondary to general background slowing.  This finding may be seen with a diffuse disturbance that is etiologically nonspecific, but may include a metabolic encephalopathy or post-ictal state, among other possibilities.  The fast beta activity noted likely represents a medication effect.  No epileptiform activity is noted.    Carotid Doppler - unremarkable  2D Echo - EF 60 - 65%. No cardiac source of emboli identified.   Sars Corona Virus 2 - negative  LDL - 46  HgbA1c - 5.1  UDS - cocaine  VTE prophylaxis - Lovenox Diet  Diet Order            Diet clear liquid Room service appropriate? Yes; Fluid consistency: Nectar Thick  Diet effective now              No antithrombotic prior to admission, now on   Plavix 75 mg daily and aspirin discontinued due to esophageal ulcer and bleed.  Patient will be  counseled to be compliant with her antithrombotic medications  Ongoing aggressive stroke risk factor management  Therapy recommendations:  pending  Disposition:  Pending  Hypertension  Home BP meds: Amlodipine  Current BP meds: Amlodipine ; Clonidine  Stable . Permissive hypertension (OK if < 220/120) but gradually normalize in 5-7 days  . Long-term BP goal normotensive  Hyperlipidemia  Home Lipid lowering medication: Zocor 20 mg daily  LDL 46, goal < 70  Current lipid lowering medication: none - resume Zocor  Continue statin at discharge  Other Stroke Risk Factors  Advanced age  ETOH use, advised to drink no more than 1 alcoholic beverage per day.  Overweight, Body mass index is 27.98 kg/m., recommend weight loss, diet and exercise as appropriate   Hx stroke/TIA  Substance Abuse  Other Active Problems  Code status - Full code  Hyponatremia - 130  Anemia - Hb - 11.7  ETOH - 6 pack of beer per day Upper GI bleeding on aspirin Plavix head 08/15/2019.  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  Recommend Keppra for seizure prophylaxis but hold aspirin and Plavix due to upper GI bleed for now and continue aggressive risk factor modification.  Type and crossmatch 2 units of blood and may need transfusion.  Resume Plavix in the next 1 to 2 days if stable from bleeding standpoint.  Stroke team will sign off.  Follow-up as an outpatient stroke clinic.  Discussed with Dr. Reesa Chew greater than 50% time during this 25-minute visit was spent on counseling and coordination of care and answering questions about her stroke Antony Contras, MD To contact Stroke Continuity provider, please refer to [email or web address] After hours, contact General Neurology

## 2019-08-17 NOTE — Progress Notes (Signed)
[facility] Gastroenterology Progress Note  Cindy [name] 59 y.o. 1960/10/27   Subjective: Patient states she is feeling well this morning.  She denies any abdominal pain, chest pain, nausea, vomiting, hematemesis.  She had one dark bowel movement yesterday but did not see any blood.  Objective: Vital signs: Vitals:   08/17/19 1126 08/17/19 1415  BP: 122/76 (!) 119/92  Pulse: 97 98  Resp: 18 18  Temp: 98.6 F (37 C) 98.8 F (37.1 C)  SpO2: 97% 99%    Physical Exam: Gen: alert, no acute distress  HEENT: anicteric sclera CV: RRR Chest: CTA B Abd: Soft, nontender, nondistended.  Normoactive bowel sounds.  No guarding or peritoneal signs. Ext: no edema  Lab Results: Recent Labs    08/16/19 0043 08/17/19 0335  NA 129* 129*  K 5.0 3.9  CL 99 102  CO2 21* 19*  GLUCOSE 144* 123*  BUN 36* 33*  CREATININE 1.18* 1.19*  CALCIUM 8.6* 8.4*  MG  --  1.8  PHOS 6.2*  --    Recent Labs    08/16/19 0043  ALBUMIN 2.5*   Recent Labs    08/16/19 1550 08/17/19 0335  WBC  --  6.5  HGB 7.4* 7.1*  HCT 21.9* 21.5*  MCV  --  97.3  PLT  --  368      Assessment/Plan: Patient with one episode of hematemesis.  She underwent EGD yesterday which showed one cratered small distal esophageal ulcer with stigmata of recent bleeding (did not bleed with washing), esophageal mucosal changes suspicious for short segment Barrett's esophagus, and a medium size hiatal hernia.  Hemoglobin yesterday was 7.4, mildly decreased to 7.1 today.  Patient is hemodynamically stable.  Continue to monitor H&H and transfuse as needed.  Continue PPI twice daily.  Patient to follow-up with Dr. Ewing Schlein at [facility] in 4 weeks.  Plan for repeat EGD (outpatient) in 2 to 3 months.  [facility] will sign off. Please contact us if we can be of any further assistance during this hospital stay.  Edrick Kins 08/17/2019, 2:41 PM  Questions please call [phone]

## 2019-08-17 NOTE — Anesthesia Postprocedure Evaluation (Signed)
Anesthesia Post Note  Patient: Cindy [name]  Procedure(s) Performed: ESOPHAGOGASTRODUODENOSCOPY (EGD) (N/A )     Patient location during evaluation: Endoscopy Anesthesia Type: MAC Level of consciousness: awake and alert Pain management: pain level controlled Vital Signs Assessment: post-procedure vital signs reviewed and stable Respiratory status: spontaneous breathing, nonlabored ventilation, respiratory function stable and patient connected to nasal cannula oxygen Cardiovascular status: stable and blood pressure returned to baseline Postop Assessment: no apparent nausea or vomiting Anesthetic complications: no    Last Vitals:  Vitals:   08/17/19 1539 08/17/19 1711  BP: 134/83 132/70  Pulse: 94 98  Resp: 18 20  Temp: 36.8 C 36.8 C  SpO2: 98% 98%    Last Pain:  Vitals:   08/17/19 1711  TempSrc: Oral  PainSc: 0-No pain                 Catalina Gravel

## 2019-08-18 ENCOUNTER — Encounter (HOSPITAL_COMMUNITY): Payer: Self-pay | Admitting: Student

## 2019-08-18 DIAGNOSIS — K2211 Ulcer of esophagus with bleeding: Secondary | ICD-10-CM

## 2019-08-18 LAB — GLUCOSE, CAPILLARY
Glucose-Capillary: 104 mg/dL — ABNORMAL HIGH (ref 70–99)
Glucose-Capillary: 105 mg/dL — ABNORMAL HIGH (ref 70–99)
Glucose-Capillary: 109 mg/dL — ABNORMAL HIGH (ref 70–99)
Glucose-Capillary: 117 mg/dL — ABNORMAL HIGH (ref 70–99)
Glucose-Capillary: 122 mg/dL — ABNORMAL HIGH (ref 70–99)
Glucose-Capillary: 123 mg/dL — ABNORMAL HIGH (ref 70–99)
Glucose-Capillary: 94 mg/dL (ref 70–99)

## 2019-08-18 LAB — CBC
HCT: 25.7 % — ABNORMAL LOW (ref 36.0–46.0)
Hemoglobin: 8.8 g/dL — ABNORMAL LOW (ref 12.0–15.0)
MCH: 32.4 pg (ref 26.0–34.0)
MCHC: 34.2 g/dL (ref 30.0–36.0)
MCV: 94.5 fL (ref 80.0–100.0)
Platelets: 374 10*3/uL (ref 150–400)
RBC: 2.72 MIL/uL — ABNORMAL LOW (ref 3.87–5.11)
RDW: 12.8 % (ref 11.5–15.5)
WBC: 7.3 10*3/uL (ref 4.0–10.5)
nRBC: 0 % (ref 0.0–0.2)

## 2019-08-18 LAB — TYPE AND SCREEN
ABO/RH(D): A POS
Antibody Screen: NEGATIVE
Unit division: 0

## 2019-08-18 LAB — BPAM RBC
Blood Product Expiration Date: 202104252359
ISSUE DATE / TIME: 202103311410
Unit Type and Rh: 6200

## 2019-08-18 LAB — BASIC METABOLIC PANEL
Anion gap: 9 (ref 5–15)
BUN: 23 mg/dL — ABNORMAL HIGH (ref 6–20)
CO2: 21 mmol/L — ABNORMAL LOW (ref 22–32)
Calcium: 8.8 mg/dL — ABNORMAL LOW (ref 8.9–10.3)
Chloride: 104 mmol/L (ref 98–111)
Creatinine, Ser: 1.18 mg/dL — ABNORMAL HIGH (ref 0.44–1.00)
GFR calc Af Amer: 58 mL/min — ABNORMAL LOW (ref >60)
GFR calc non Af Amer: 50 mL/min — ABNORMAL LOW (ref >60)
Glucose, Bld: 111 mg/dL — ABNORMAL HIGH (ref 70–99)
Potassium: 4 mmol/L (ref 3.5–5.1)
Sodium: 134 mmol/L — ABNORMAL LOW (ref 135–145)

## 2019-08-18 LAB — MAGNESIUM: Magnesium: 1.7 mg/dL (ref 1.7–2.4)

## 2019-08-18 MED ORDER — ADULT MULTIVITAMIN LIQUID CH
15.0000 mL | Freq: Every day | ORAL | Status: DC
  Administered 2019-08-18 – 2019-08-19 (×2): 15 mL via ORAL
  Filled 2019-08-18: qty 15

## 2019-08-18 MED ORDER — CLOPIDOGREL BISULFATE 75 MG PO TABS
75.0000 mg | ORAL_TABLET | Freq: Every day | ORAL | Status: DC
  Administered 2019-08-19: 09:00:00 75 mg via ORAL
  Filled 2019-08-18: qty 1

## 2019-08-18 NOTE — Progress Notes (Signed)
Inpatient Rehab Admissions Coordinator:   Following Ms. Lorenda Hatchet for my colleague, Ottie Glazier.  I do not have a bed available for this patient today.  Will continue to follow for timing of possible rehab admission, pending bed availability.   Estill Dooms, PT, DPT Admissions Coordinator [phone] 08/18/19  10:52 AM

## 2019-08-18 NOTE — Progress Notes (Signed)
PROGRESS NOTE    Cindy [name]  UKG:[id number] DOB: 10/14/1960 DOA: 08/05/2019 PCP: Avon Gully, MD   Brief Narrative:  59 year old with a history of CVA in 2005, HTN, HLD, polysubstance abuse, alcohol use admitted for having seizure upon admission she was intubated.  She self extubated on 3/22.  CT of the head was negative for any acute pathology.  EEG was negative for any active seizures.  MRI brain on 3/24 was suggestive of small acute multifocal CVA, carotid Dopplers showed bilateral ICA stenosis 1/39%.  Lower extremity Dopplers was negative for DVT.  Briefly received 2 days of azithromycin and Rocephin.  Endoscopy showed esophageal ulcer therefore started on PPI.   Assessment & Plan:   Active Problems:   Status epilepticus (HCC)   Acute encephalopathy   Cerebral embolism with cerebral infarction  Acute metabolic encephalopathy with active seizures, stable Acute multifocal CVA suspect embolic -CVA noted on MRI of the brain.  Underwent bilateral carotid Dopplers-1-39% stenosis.  Seen by neurology team. -EEG-slightly abnormal but no seizure activity noted -Echocardiogram-60-65% -A1c 5.1, LDL 46.  UDS positive for cocaine -Aspirin and Plavix held-resume Plavix only on 08/19/2019 -Continue Keppra 500 mg daily  Upper GI bleeding/coffee-ground emesis Acute blood loss anemia -Likely from esophageal ulcer.   -We will need repeat endoscopy in 2-3 months due to concerns of Barrett's esophagitis. -Discontinue octreotide -Hemoglobin dropped to 7.1, transfuse 1 unit PRBC  Alcohol use Illicit drug use -Counseled to quit using alcohol and illicit drugs.  No longer showing any signs of alcohol withdrawal, last drink about 10 days ago.  Advised to quit drinking this.  Essential hypertension -Due to active bleeding soft blood pressure her amlodipine and clonidine were discontinued this morning, I can slowly resume an appropriate  Hyperlipidemia -On Zocor  PT is recommending CIR Speech  recommends dysphagia 1 diet  DVT prophylaxis: None due to GI bleeding Code Status: Full code none Family Communication: Husband at bedside Disposition Plan:   Patient From= home  Patient Anticipated D/C place= CIR  Barriers= CIR when bed available  Subjective: No complaints, no further bleeding  Review of Systems Otherwise negative except as per HPI, including: General = no fevers, chills, dizziness,  fatigue HEENT/EYES = negative for loss of vision, double vision, blurred vision,  sore throa Cardiovascular= negative for chest pain, palpitation Respiratory/lungs= negative for shortness of breath, cough, wheezing; hemoptysis,  Gastrointestinal= negative for nausea, vomiting, abdominal pain Genitourinary= negative for Dysuria MSK = Negative for arthralgia, myalgias Neurology= Negative for headache, numbness, tingling  Psychiatry= Negative for suicidal and homocidal ideation Skin= Negative for Rash   Examination: Constitutional: Not in acute distress Respiratory: Clear to auscultation bilaterally Cardiovascular: Normal sinus rhythm, no rubs Abdomen: Nontender nondistended good bowel sounds Musculoskeletal: No edema noted Skin: No rashes seen Neurologic: CN 2-12 grossly intact.  And nonfocal Psychiatric: Normal judgment and insight. Alert and oriented x 3. Normal mood.    Objective: Vitals:   08/18/19 0328 08/18/19 0351 08/18/19 0829 08/18/19 1200  BP:  (!) 148/83 127/73 132/81  Pulse:  94 (!) 104 100  Resp:  20 16 16   Temp:  98.6 F (37 C) 98.9 F (37.2 C) 98.6 F (37 C)  TempSrc:  Oral Oral Oral  SpO2:  99% 100% 99%  Weight: 72.6 kg     Height:        Intake/Output Summary (Last 24 hours) at 08/18/2019 1235 Last data filed at 08/17/2019 1711 Gross per 24 hour  Intake 380 ml  Output --  Net 380 ml   Filed Weights   08/14/19 0343 08/17/19 0417 08/18/19 0328  Weight: 73.9 kg 73.9 kg 72.6 kg     Data Reviewed:   CBC: Recent Labs  Lab 08/12/19 0240  08/15/19 1900 08/16/19 0043 08/16/19 0603 08/16/19 1550 08/17/19 0335 08/18/19 0303  WBC 7.4  --   --   --   --  6.5 7.3  HGB 11.7*   < > 8.3* 8.4* 7.4* 7.1* 8.8*  HCT 34.5*   < > 24.5* 24.9* 21.9* 21.5* 25.7*  MCV 96.1  --   --   --   --  97.3 94.5  PLT 277  --   --   --   --  368 374   < > = values in this interval not displayed.   Basic Metabolic Panel: Recent Labs  Lab 08/12/19 0240 08/13/19 0519 08/16/19 0043 08/17/19 0335 08/18/19 0303  NA 131* 130* 129* 129* 134*  K 4.6 4.5 5.0 3.9 4.0  CL 98 98 99 102 104  CO2 21* 20* 21* 19* 21*  GLUCOSE 136* 133* 144* 123* 111*  BUN 17 13 36* 33* 23*  CREATININE 0.84 0.99 1.18* 1.19* 1.18*  CALCIUM 8.7* 9.3 8.6* 8.4* 8.8*  MG  --   --   --  1.8 1.7  PHOS  --   --  6.2*  --   --    GFR: Estimated Creatinine Clearance: 49.5 mL/min (A) (by C-G formula based on SCr of 1.18 mg/dL (H)). Liver Function Tests: Recent Labs  Lab 08/16/19 0043  ALBUMIN 2.5*   No results for input(s): LIPASE, AMYLASE in the last 168 hours. No results for input(s): AMMONIA in the last 168 hours. Coagulation Profile: No results for input(s): INR, PROTIME in the last 168 hours. Cardiac Enzymes: No results for input(s): CKTOTAL, CKMB, CKMBINDEX, TROPONINI in the last 168 hours. BNP (last 3 results) No results for input(s): PROBNP in the last 8760 hours. HbA1C: No results for input(s): HGBA1C in the last 72 hours. CBG: Recent Labs  Lab 08/17/19 2013 08/18/19 0014 08/18/19 0354 08/18/19 0736 08/18/19 1201  GLUCAP 106* 104* 94 105* 109*   Lipid Profile: No results for input(s): CHOL, HDL, LDLCALC, TRIG, CHOLHDL, LDLDIRECT in the last 72 hours. Thyroid Function Tests: No results for input(s): TSH, T4TOTAL, FREET4, T3FREE, THYROIDAB in the last 72 hours. Anemia Panel: No results for input(s): VITAMINB12, FOLATE, FERRITIN, TIBC, IRON, RETICCTPCT in the last 72 hours. Sepsis Labs: No results for input(s): PROCALCITON, LATICACIDVEN in the last  168 hours.  Recent Results (from the past 240 hour(s))  Culture, blood (Routine X 2) w Reflex to ID Panel     Status: None   Collection Time: 08/11/19  9:40 AM   Specimen: BLOOD  Result Value Ref Range Status   Specimen Description BLOOD LEFT ANTECUBITAL  Final   Special Requests   Final    BOTTLES DRAWN AEROBIC ONLY Blood Culture results may not be optimal due to an inadequate volume of blood received in culture bottles   Culture   Final    NO GROWTH 5 DAYS Performed at [facility], [address], [place], [place] [address]    Report Status 08/16/2019 FINAL  Final  Culture, blood (Routine X 2) w Reflex to ID Panel     Status: None   Collection Time: 08/11/19  9:53 AM   Specimen: BLOOD LEFT HAND  Result Value Ref Range Status   Specimen Description BLOOD LEFT HAND  Final   Special Requests   Final    BOTTLES DRAWN AEROBIC AND ANAEROBIC Blood Culture adequate volume   Culture   Final    NO GROWTH 5 DAYS Performed at [facility], [address], [place], [place] [address]    Report Status 08/16/2019 FINAL  Final         Radiology Studies: No results found.      Scheduled Meds: . chlorhexidine  15 mL Mouth Rinse BID  . insulin aspart  0-9 Units Subcutaneous Q4H  . levETIRAcetam  500 mg Oral Daily  . mouth rinse  15 mL Mouth Rinse q12n4p  . multivitamin  15 mL Oral Daily  . pantoprazole  40 mg Oral BID AC  . QUEtiapine  25 mg Oral QHS  . simvastatin  20 mg Oral q1800   Continuous Infusions: . sodium chloride Stopped (08/08/19 1952)     LOS: 13 days   Time spent= 25 mins    Kayode Petion Arsenio Loader, MD [facility]  If 7PM-7AM, please contact night-coverage  08/18/2019, 12:35 PM

## 2019-08-18 NOTE — Progress Notes (Signed)
  Speech Language Pathology Treatment: Dysphagia  Patient Details Name: Cindy [name] MRN: [id number] DOB: 12-10-1960 Today's Date: 08/18/2019 Time: 4536-4680 SLP Time Calculation (min) (ACUTE ONLY): 8 min  Assessment / Plan / Recommendation Clinical Impression  Pt was encountered awake/alert and she was seen for skilled ST targeting diet tolerance and diagnostic treatment.  Pt was seen with trials of thin liquid and nectar-thick liquid.  Puree was not tried on this date secondary to pt being changed to a clear (nectar-thick) liquid diet.  Pt continued to exhibit impulsivity with drinks, but she was able to follow commands to take small, single sips.  An immediate, prolonged cough was observed following 1/4 sips of thin liquid and a delayed cough was observed following 1/4 sips of thin liquid.  No overt s/sx of aspiration were observed with nectar-thick liquid despite challenging.  Recommend continuation of a clear (nectar-thick) liquid diet with upgrade to Dysphagia 1 (puree) solids when medically appropriate.     HPI HPI: 59 y/o F who presented to [facility] 3/19 with reports of altered mental status. Active seizures on arrival to ER.  Hx of polysubstance abuse. Intubated from 3/19-3/22.       SLP Plan  Continue with current plan of care       Recommendations  Diet recommendations: Nectar-thick liquid Liquids provided via: Cup;Straw Medication Administration: Crushed with puree Supervision: Staff to assist with self feeding;Full supervision/cueing for compensatory strategies Compensations: Minimize environmental distractions;Slow rate;Small sips/bites;Clear throat intermittently                Oral Care Recommendations: Oral care BID Follow up Recommendations: Skilled Nursing facility SLP Visit Diagnosis: Dysphagia, oropharyngeal phase (R13.12) Plan: Continue with current plan of care                     Villa Herb M.S., CCC-SLP Acute Rehabilitation Services Office: [phone]  Shanon Rosser [facility] 08/18/2019, 3:08 PM

## 2019-08-18 NOTE — Progress Notes (Signed)
Physical Therapy Treatment Patient Details Name: Cindy [name] MRN: [id number] DOB: 1960-10-21 Today's Date: 08/18/2019    History of Present Illness 59 y/o F who presented to [facility] 3/19 with reports of altered mental status. Active seizures on arrival to ER.  Hx of polysubstance abuse. Intubated from 3/19-3/22.  Other PMH includes CVA and HTN.     PT Comments    Pt in bed upon arrival of PT, eager to participate in session with focus on progressing functional mobility. The pt was able to demo sig improvements in functional transfers and ambulation tolerance as she was able to complete a 5xSTS in 40 sec without use of an AD (>11.4 sec = increased risk of falls), and multiple short bouts of ambulation in the room with use of a RW for stability. The pt trialed ambulation with use of RW and HHA of 1, and despite not having LOB with either, demos sig improved stability with use of RW. The pt will continue to benefit from skilled PT at this time to further progress functional mobility, endurance, and dynamic stability to improve independence with mobility prior to d/c home.    Follow Up Recommendations  CIR;Supervision/Assistance - 24 hour     Equipment Recommendations  Rolling walker with 5" wheels;3in1 (PT)    Recommendations for Other Services       Precautions / Restrictions Precautions Precautions: Fall Restrictions Weight Bearing Restrictions: No    Mobility  Bed Mobility Overal bed mobility: Needs Assistance Bed Mobility: Supine to Sit;Sit to Supine     Supine to sit: Supervision     General bed mobility comments: Use of bed rail, good initiation.  Transfers Overall transfer level: Needs assistance Equipment used: None;1 person hand held assist;Rolling walker (2 wheeled) Transfers: Sit to/from Stand Sit to Stand: Min guard         General transfer comment: pt able to complete 5xSTS with use of armrests but no AD in 40 sec, min G for safety, at other times, used RW or  HHA of 1, and was able to complete without phsyical assist  Ambulation/Gait Ambulation/Gait assistance: Min guard Gait Distance (Feet): 15 Feet(15 ft x2) Assistive device: Rolling walker (2 wheeled);1 person hand held assist Gait Pattern/deviations: Step-to pattern;Decreased stride length;Trunk flexed   Gait velocity interpretation: <1.31 ft/sec, indicative of household ambulator General Gait Details: pt able to demo slow but steady ambulation in room, taking short steps with minimal clearance, but no LOB. very slow with turns, improved stability with RW   Stairs             Wheelchair Mobility    Modified Rankin (Stroke Patients Only)       Balance Overall balance assessment: Needs assistance Sitting-balance support: Feet supported;Bilateral upper extremity supported Sitting balance-Leahy Scale: Fair Sitting balance - Comments: pt with multiple LOB in seated position while attempting to don socks, was able to correct and return to sitting upright without assist but unable to prevent further LOB Postural control: Right lateral lean;Posterior lean Standing balance support: Bilateral upper extremity supported Standing balance-Leahy Scale: Fair Standing balance comment: able to complete sit-stand without AD, benefits from RW for ambulation                            Cognition Arousal/Alertness: Awake/alert Behavior During Therapy: [facility] for tasks assessed/performed;Impulsive(more eagerness to participate than impulsivity) Overall Cognitive Status: Impaired/Different from baseline Area of Impairment: Safety/judgement;Awareness;Problem solving;Memory;Attention  Current Attention Level: Sustained Memory: Decreased short-term memory Following Commands: Follows one step commands consistently;Follows multi-step commands consistently Safety/Judgement: Decreased awareness of safety;Decreased awareness of deficits Awareness: Intellectual Problem  Solving: Difficulty sequencing;Slow processing;Requires verbal cues;Requires tactile cues General Comments: Pt pleasant and willing to participate in therapy. Pt easily distracted by environment.       Exercises      General Comments        Pertinent Vitals/Pain Pain Assessment: No/denies pain Pain Intervention(s): Monitored during session;Limited activity within patient's tolerance    Home Living                      Prior Function            PT Goals (current goals can now be found in the care plan section) Acute Rehab PT Goals Patient Stated Goal: to go home PT Goal Formulation: With patient Time For Goal Achievement: 08/23/19 Potential to Achieve Goals: Good Progress towards PT goals: Progressing toward goals    Frequency    Min 3X/week      PT Plan Current plan remains appropriate    Co-evaluation              AM-PAC PT "6 Clicks" Mobility   Outcome Measure  Help needed turning from your back to your side while in a flat bed without using bedrails?: A Little Help needed moving from lying on your back to sitting on the side of a flat bed without using bedrails?: A Little Help needed moving to and from a bed to a chair (including a wheelchair)?: A Little Help needed standing up from a chair using your arms (e.g., wheelchair or bedside chair)?: A Little Help needed to walk in hospital room?: A Little Help needed climbing 3-5 steps with a railing? : A Lot 6 Click Score: 17    End of Session Equipment Utilized During Treatment: Gait belt Activity Tolerance: Patient tolerated treatment well Patient left: with call bell/phone within reach;with chair alarm set;in chair Nurse Communication: Mobility status PT Visit Diagnosis: Muscle weakness (generalized) (M62.81)     Time: 2229-7989 PT Time Calculation (min) (ACUTE ONLY): 16 min  Charges:  $Gait Training: 8-22 mins                     Rolm Baptise, PT, DPT   Acute Rehabilitation  Department Pager #: [phone]   Gaetana Michaelis 08/18/2019, 1:23 PM

## 2019-08-18 NOTE — Plan of Care (Signed)
  Problem: Education: Goal: Knowledge of General Education information will improve Description: Including pain rating scale, medication(s)/side effects and non-pharmacologic comfort measures Outcome: Progressing   Problem: Activity: Goal: Risk for activity intolerance will decrease Outcome: Progressing   Problem: Nutrition: Goal: Adequate nutrition will be maintained Outcome: Progressing   Problem: Pain Managment: Goal: General experience of comfort will improve Outcome: Progressing   Problem: Safety: Goal: Ability to remain free from injury will improve Outcome: Progressing   Problem: Skin Integrity: Goal: Risk for impaired skin integrity will decrease Outcome: Progressing   Problem: Education: Goal: Expressions of having a comfortable level of knowledge regarding the disease process will increase Outcome: Progressing   Problem: Coping: Goal: Ability to adjust to condition or change in health will improve Outcome: Progressing Goal: Ability to identify appropriate support needs will improve Outcome: Progressing   Problem: Health Behavior/Discharge Planning: Goal: Compliance with prescribed medication regimen will improve Outcome: Progressing   Problem: Medication: Goal: Risk for medication side effects will decrease Outcome: Progressing   Problem: Clinical Measurements: Goal: Complications related to the disease process, condition or treatment will be avoided or minimized Outcome: Progressing Goal: Diagnostic test results will improve Outcome: Progressing   Problem: Safety: Goal: Verbalization of understanding the information provided will improve Outcome: Progressing   Problem: Self-Concept: Goal: Level of anxiety will decrease Outcome: Progressing Goal: Ability to verbalize feelings about condition will improve Outcome: Progressing

## 2019-08-19 ENCOUNTER — Encounter (HOSPITAL_COMMUNITY): Payer: Self-pay | Admitting: Physical Medicine & Rehabilitation

## 2019-08-19 ENCOUNTER — Other Ambulatory Visit: Payer: Self-pay

## 2019-08-19 ENCOUNTER — Inpatient Hospital Stay (HOSPITAL_COMMUNITY)
Admission: RE | Admit: 2019-08-19 | Discharge: 2019-08-27 | DRG: 057 | Disposition: A | Discharge status: Home Health | Payer: Self-pay | Source: Intra-hospital | Attending: Physical Medicine & Rehabilitation | Admitting: Physical Medicine & Rehabilitation

## 2019-08-19 DIAGNOSIS — R739 Hyperglycemia, unspecified: Secondary | ICD-10-CM | POA: Diagnosis present

## 2019-08-19 DIAGNOSIS — K5900 Constipation, unspecified: Secondary | ICD-10-CM | POA: Diagnosis not present

## 2019-08-19 DIAGNOSIS — E785 Hyperlipidemia, unspecified: Secondary | ICD-10-CM | POA: Diagnosis present

## 2019-08-19 DIAGNOSIS — I69391 Dysphagia following cerebral infarction: Secondary | ICD-10-CM

## 2019-08-19 DIAGNOSIS — R1310 Dysphagia, unspecified: Secondary | ICD-10-CM | POA: Diagnosis present

## 2019-08-19 DIAGNOSIS — Z79899 Other long term (current) drug therapy: Secondary | ICD-10-CM

## 2019-08-19 DIAGNOSIS — I10 Essential (primary) hypertension: Secondary | ICD-10-CM | POA: Diagnosis present

## 2019-08-19 DIAGNOSIS — I639 Cerebral infarction, unspecified: Secondary | ICD-10-CM | POA: Diagnosis present

## 2019-08-19 DIAGNOSIS — I69351 Hemiplegia and hemiparesis following cerebral infarction affecting right dominant side: Principal | ICD-10-CM

## 2019-08-19 DIAGNOSIS — I6340 Cerebral infarction due to embolism of unspecified cerebral artery: Secondary | ICD-10-CM | POA: Diagnosis present

## 2019-08-19 DIAGNOSIS — D62 Acute posthemorrhagic anemia: Secondary | ICD-10-CM | POA: Diagnosis present

## 2019-08-19 DIAGNOSIS — I69393 Ataxia following cerebral infarction: Secondary | ICD-10-CM

## 2019-08-19 DIAGNOSIS — Z823 Family history of stroke: Secondary | ICD-10-CM

## 2019-08-19 LAB — CBC
HCT: 27.9 % — ABNORMAL LOW (ref 36.0–46.0)
Hemoglobin: 9.4 g/dL — ABNORMAL LOW (ref 12.0–15.0)
MCH: 31.8 pg (ref 26.0–34.0)
MCHC: 33.7 g/dL (ref 30.0–36.0)
MCV: 94.3 fL (ref 80.0–100.0)
Platelets: 448 10*3/uL — ABNORMAL HIGH (ref 150–400)
RBC: 2.96 MIL/uL — ABNORMAL LOW (ref 3.87–5.11)
RDW: 12.7 % (ref 11.5–15.5)
WBC: 8.4 10*3/uL (ref 4.0–10.5)
nRBC: 0 % (ref 0.0–0.2)

## 2019-08-19 LAB — BASIC METABOLIC PANEL
Anion gap: 12 (ref 5–15)
BUN: 23 mg/dL — ABNORMAL HIGH (ref 6–20)
CO2: 22 mmol/L (ref 22–32)
Calcium: 9.1 mg/dL (ref 8.9–10.3)
Chloride: 101 mmol/L (ref 98–111)
Creatinine, Ser: 1.14 mg/dL — ABNORMAL HIGH (ref 0.44–1.00)
GFR calc Af Amer: >60 mL/min (ref >60)
GFR calc non Af Amer: 52 mL/min — ABNORMAL LOW (ref >60)
Glucose, Bld: 101 mg/dL — ABNORMAL HIGH (ref 70–99)
Potassium: 3.3 mmol/L — ABNORMAL LOW (ref 3.5–5.1)
Sodium: 135 mmol/L (ref 135–145)

## 2019-08-19 LAB — GLUCOSE, CAPILLARY
Glucose-Capillary: 126 mg/dL — ABNORMAL HIGH (ref 70–99)
Glucose-Capillary: 123 mg/dL — ABNORMAL HIGH (ref 70–99)
Glucose-Capillary: 108 mg/dL — ABNORMAL HIGH (ref 70–99)

## 2019-08-19 LAB — MAGNESIUM: Magnesium: 1.6 mg/dL — ABNORMAL LOW (ref 1.7–2.4)

## 2019-08-19 MED ORDER — RESOURCE THICKENUP CLEAR PO POWD
ORAL | Status: DC | PRN
  Filled 2019-08-19: qty 125

## 2019-08-19 MED ORDER — SORBITOL 70 % SOLN: 30.0000 mL | Freq: Every day | Status: DC | PRN

## 2019-08-19 MED ORDER — BISACODYL 10 MG RE SUPP: 10.0000 mg | Freq: Every day | RECTAL | Status: DC | PRN

## 2019-08-19 MED ORDER — LIP MEDEX EX OINT
TOPICAL_OINTMENT | CUTANEOUS | Status: DC | PRN
  Administered 2019-08-19: 1 via TOPICAL
  Filled 2019-08-19: qty 7

## 2019-08-19 MED ORDER — PANTOPRAZOLE SODIUM 40 MG PO TBEC
40.0000 mg | DELAYED_RELEASE_TABLET | Freq: Two times a day (BID) | ORAL | Status: DC
  Administered 2019-08-19 – 2019-08-27 (×16): 40 mg via ORAL
  Filled 2019-08-19 (×16): qty 1

## 2019-08-19 MED ORDER — SENNOSIDES 8.8 MG/5ML PO SYRP
5.0000 mL | ORAL_SOLUTION | Freq: Two times a day (BID) | ORAL | Status: DC | PRN
  Filled 2019-08-19: qty 5

## 2019-08-19 MED ORDER — ADULT MULTIVITAMIN LIQUID CH: 15.0000 mL | Freq: Every day | ORAL | Status: DC

## 2019-08-19 MED ORDER — LEVETIRACETAM ER 500 MG PO TB24
500.0000 mg | ORAL_TABLET | Freq: Every day | ORAL | Status: DC
  Administered 2019-08-20 – 2019-08-27 (×8): 500 mg via ORAL
  Filled 2019-08-19 (×8): qty 1

## 2019-08-19 MED ORDER — MAGNESIUM OXIDE 400 (241.3 MG) MG PO TABS
800.0000 mg | ORAL_TABLET | ORAL | Status: AC
  Administered 2019-08-19 (×2): 800 mg via ORAL
  Filled 2019-08-19 (×2): qty 2

## 2019-08-19 MED ORDER — LEVETIRACETAM ER 500 MG PO TB24: 500.0000 mg | ORAL_TABLET | Freq: Every day | ORAL | Status: DC

## 2019-08-19 MED ORDER — CLOPIDOGREL BISULFATE 75 MG PO TABS: 75.0000 mg | ORAL_TABLET | Freq: Every day | ORAL | Status: DC

## 2019-08-19 MED ORDER — SENNOSIDES 8.8 MG/5ML PO SYRP: 5.0000 mL | ORAL_SOLUTION | Freq: Two times a day (BID) | ORAL | 0 refills | Status: DC | PRN

## 2019-08-19 MED ORDER — CLOPIDOGREL BISULFATE 75 MG PO TABS
75.0000 mg | ORAL_TABLET | Freq: Every day | ORAL | Status: DC
  Administered 2019-08-20 – 2019-08-27 (×8): 75 mg via ORAL
  Filled 2019-08-19 (×8): qty 1

## 2019-08-19 MED ORDER — SIMVASTATIN 20 MG PO TABS
20.0000 mg | ORAL_TABLET | Freq: Every day | ORAL | Status: DC
  Administered 2019-08-19 – 2019-08-26 (×8): 20 mg via ORAL
  Filled 2019-08-19 (×8): qty 1

## 2019-08-19 MED ORDER — QUETIAPINE FUMARATE 25 MG PO TABS: 25.0000 mg | ORAL_TABLET | Freq: Every day | ORAL | Status: DC

## 2019-08-19 MED ORDER — POTASSIUM CHLORIDE CRYS ER 20 MEQ PO TBCR
40.0000 meq | EXTENDED_RELEASE_TABLET | ORAL | Status: AC
  Administered 2019-08-19 (×2): 40 meq via ORAL
  Filled 2019-08-19 (×2): qty 2

## 2019-08-19 MED ORDER — PANTOPRAZOLE SODIUM 40 MG PO TBEC: 40.0000 mg | DELAYED_RELEASE_TABLET | Freq: Two times a day (BID) | ORAL | Status: DC

## 2019-08-19 MED ORDER — ADULT MULTIVITAMIN LIQUID CH
15.0000 mL | Freq: Every day | ORAL | Status: DC
  Administered 2019-08-20 – 2019-08-21 (×2): 15 mL via ORAL
  Filled 2019-08-19 (×3): qty 15

## 2019-08-19 MED ORDER — QUETIAPINE FUMARATE 25 MG PO TABS
25.0000 mg | ORAL_TABLET | Freq: Every day | ORAL | Status: DC
  Administered 2019-08-19 – 2019-08-26 (×8): 25 mg via ORAL
  Filled 2019-08-19 (×8): qty 1

## 2019-08-19 NOTE — TOC Transition Note (Signed)
Transition of Care [facility]) - CM/SW Discharge Note   Patient Details  Name: CYRA SPADER MRN: [id number] Date of Birth: 02-06-1961  [facility] CM/SW Contact:  Kermit Balo, RN Phone Number: 08/19/2019, 12:01 PM   Clinical Narrative:    Pt discharging to CIR today. CM signing off.    Final next level of care: IP Rehab Facility Barriers to Discharge: Inadequate or no insurance, Barriers Unresolved (comment)   Patient Goals and CMS Choice        Discharge Placement                       Discharge Plan and Services                                     Social Determinants of Health (SDOH) Interventions     Readmission Risk Interventions No flowsheet data found.

## 2019-08-19 NOTE — Discharge Summary (Signed)
Physician Discharge Summary  Cindy [name] ZOX:[id number] DOB: 02-28-1961 DOA: 08/05/2019  PCP: Cindy Gully, MD  Admit date: 08/05/2019 Discharge date: 08/19/2019  Admitted From: Home Disposition: CIR  Recommendations for Outpatient Follow-up:  1. Follow up with PCP in 1-2 weeks 2. Please obtain BMP/CBC in one week your next doctors visit.  3. Plavix 75 mg daily, follow-up with neurology in 3 weeks 4. Protonix 40 mg twice daily 5. Patient to follow-up with Dr. Ewing [name] at [facility] in 4 weeks.  Plan for repeat EGD (outpatient) in 2 to 3 months.   Discharge Condition: Stable CODE STATUS: Full code Diet recommendation: 2 g salt  Brief/Interim Summary: 59 year old with a history of CVA in 2005, HTN, HLD, polysubstance abuse, alcohol use admitted for having seizure upon admission she was intubated.  She self extubated on 3/22.  CT of the head was negative for any acute pathology.  EEG was negative for any active seizures.  MRI brain on 3/24 was suggestive of small acute multifocal CVA, carotid Dopplers showed bilateral ICA stenosis 1/39%.  Lower extremity Dopplers was negative for DVT.  Briefly received 2 days of azithromycin and Rocephin.  Endoscopy showed esophageal ulcer therefore started on PPI.  PPI twice daily were started, follow-up outpatient GI. Neurology recommends Plavix alone at this time daily and outpatient follow-up in 3 weeks.  Patient has been cleared to start Plavix on 08/19/2019.  Stable to go to CIR.  Acute metabolic encephalopathy with active seizures, stable Acute multifocal CVA suspect embolic -CVA noted on MRI of the brain.  Underwent bilateral carotid Dopplers-1-39% stenosis.  Seen by neurology team. -EEG-slightly abnormal but no seizure activity noted -Echocardiogram-60-65% -A1c 5.1, LDL 46.  UDS positive for cocaine -Discontinue aspirin.  Start Plavix alone today and continue until seen by outpatient neurology in 3-4 weeks -Continue Keppra 500 mg daily  Upper GI  bleeding/coffee-ground emesis Acute blood loss anemia -Likely from esophageal ulcer.    Bleeding has not subsided -We will need repeat endoscopy in 2-3 months due to concerns of Barrett's esophagitis. -Discontinue octreotide  Alcohol use Illicit drug use -Counseled to quit using alcohol and illicit drugs.  No longer showing any signs of alcohol withdrawal, last drink about 10 days ago.  Advised to quit drinking this.  Essential hypertension -Resume her home medications  Hyperlipidemia -On Zocor    Discharge Diagnoses:  Active Problems:   Status epilepticus (HCC)   Acute encephalopathy   Cerebral embolism with cerebral infarction    Consultations:  Gastroenterology  Neurology  Subjective: Feels great, no complaints.  Discharge Exam: Vitals:   08/19/19 0356 08/19/19 0853  BP: (!) 164/93 128/80  Pulse: (!) 102 (!) 102  Resp: 18 20  Temp: 98.6 F (37 C) 98.3 F (36.8 C)  SpO2: 99% 100%   Vitals:   08/18/19 1955 08/18/19 2358 08/19/19 0356 08/19/19 0853  BP: (!) 147/79 131/66 (!) 164/93 128/80  Pulse: (!) 102 (!) 102 (!) 102 (!) 102  Resp: 18 18 18 20   Temp: 98.4 F (36.9 C) 98.4 F (36.9 C) 98.6 F (37 C) 98.3 F (36.8 C)  TempSrc: Oral Oral Oral Oral  SpO2: 98% 97% 99% 100%  Weight:      Height:        General: Pt is alert, awake, not in acute distress Cardiovascular: RRR, S1/S2 +, no rubs, no gallops Respiratory: CTA bilaterally, no wheezing, no rhonchi Abdominal: Soft, NT, ND, bowel sounds + Extremities: no edema, no cyanosis  Discharge Instructions   Allergies  as of 08/19/2019   No Known Allergies     Medication List    TAKE these medications   acetaminophen 500 MG tablet Commonly known as: TYLENOL Take 1,000 mg by mouth every 6 (six) hours as needed for mild pain or headache.   amLODipine 10 MG tablet Commonly known as: NORVASC TAKE 1 TABLET BY MOUTH EVERY DAY (NEEDS TO MAKE AN APPOINTMENT)   clopidogrel 75 MG tablet Commonly  known as: PLAVIX Take 1 tablet (75 mg total) by mouth daily. Start taking on: August 20, 2019   levETIRAcetam 500 MG 24 hr tablet Commonly known as: KEPPRA XR Take 1 tablet (500 mg total) by mouth daily. Start taking on: August 20, 2019   multivitamin Liqd Take 15 mLs by mouth daily. Start taking on: August 20, 2019   pantoprazole 40 MG tablet Commonly known as: PROTONIX Take 1 tablet (40 mg total) by mouth 2 (two) times daily before a meal.   QUEtiapine 25 MG tablet Commonly known as: SEROQUEL Take 1 tablet (25 mg total) by mouth at bedtime.   sennosides 8.8 MG/5ML syrup Commonly known as: SENOKOT Place 5 mLs into feeding tube 2 (two) times daily as needed for mild constipation.   simvastatin 20 MG tablet Commonly known as: ZOCOR Take 1 tablet (20 mg total) by mouth daily.      Follow-up Information    Cindy Gully, MD. Schedule an appointment as soon as possible for a visit in 1 week(s).   Specialty: Internal Medicine Contact information: [address] [place] [address] [phone]          No Known Allergies  You were cared for by a hospitalist during your hospital stay. If you have any questions about your discharge medications or the care you received while you were in the hospital after you are discharged, you can call the unit and asked to speak with the hospitalist on call if the hospitalist that took care of you is not available. Once you are discharged, your primary care physician will handle any further medical issues. Please note that no refills for any discharge medications will be authorized once you are discharged, as it is imperative that you return to your primary care physician (or establish a relationship with a primary care physician if you do not have one) for your aftercare needs so that they can reassess your need for medications and monitor your lab values.   Procedures/Studies: EEG  Result Date: 08/07/2019 Cindy Farr, MD      08/07/2019  8:28 PM ELECTROENCEPHALOGRAM REPORT Patient: Cindy [name]       Room #: [facility] EEG No. ID: [id number] Age: 59 y.o.        Sex: female Requesting Physician: Cindy [name] Report Date:  08/07/2019       Interpreting Physician: Cindy [name] History: ABEGAIL [name] is an 59 y.o. female with seizures Medications: Precedex, Fentanyl, Thiamine, Insulin, Zithromax Conditions of Recording:  This is a 21 channel routine scalp EEG performed with bipolar and monopolar montages arranged in accordance to the international 10/20 system of electrode placement. One channel was dedicated to EKG recording. The patient is in the intubated and sedated state. Description:  The background activity is slow and poorly organized.  It consists of low voltage activity along the delta-theta continuum.  This activity is continuous and diffusely distributed.  There is superimposed fast beta activity noted throughout as well No epileptiform activity is appreciated. Hyperventilation and intermittent photic stimulation were not performed. IMPRESSION:  This is an abnormal EEG secondary to general background slowing.  This finding may be seen with a diffuse disturbance that is etiologically nonspecific, but may include a metabolic encephalopathy or post-ictal state, among other possibilities.  The fast beta activity noted likely represents a medication effect.  No epileptiform activity is noted.  Cindy Farr, MD Neurology [phone] 08/07/2019, 8:19 PM   CT Head Wo Contrast  Result Date: 08/05/2019 CLINICAL DATA:  AMS, seizures EXAM: CT HEAD WITHOUT CONTRAST TECHNIQUE: Contiguous axial images were obtained from the base of the skull through the vertex without intravenous contrast. COMPARISON:  05/29/2004 FINDINGS: Brain: No evidence of acute infarction, hemorrhage, hydrocephalus, extra-axial collection or mass lesion/mass effect. Extensive periventricular and deep white matter hypodensity 6, markedly increased compared to remote prior CT  dated 2006. Vascular: No hyperdense vessel or unexpected calcification. Skull: Normal. Negative for fracture or focal lesion. Sinuses/Orbits: No acute finding. Other: None. IMPRESSION: 1. No acute intracranial pathology. 2. Extensive periventricular and deep white matter hypodensity markedly increased compared to remote prior CT dated 2006. Findings may reflect small-vessel white matter disease or alternately demyelinating disorder such as multiple sclerosis. Correlate with referable clinical history. Electronically Signed   By: Lauralyn Primes M.D.   On: 08/05/2019 13:18   CT CHEST W CONTRAST  Result Date: 08/06/2019 CLINICAL DATA:  Fall. Cocaine use. Abdominal trauma. EXAM: CT CHEST, ABDOMEN, AND PELVIS WITH CONTRAST TECHNIQUE: Multidetector CT imaging of the chest, abdomen and pelvis was performed following the standard protocol during bolus administration of intravenous contrast. CONTRAST:  OMNIPAQUE IOHEXOL 300 MG/ML  SOLN COMPARISON:  None. FINDINGS: CT CHEST FINDINGS Cardiovascular: Heart size is normal without pericardial effusion. The thoracic aorta is normal in course and caliber without dissection, aneurysm, ulceration or intramural hematoma. Mediastinum/Nodes: No mediastinal hematoma. No mediastinal, hilar or axillary lymphadenopathy. The visualized thyroid and thoracic esophageal course are unremarkable. Lungs/Pleura: No pulmonary contusion, pneumothorax or pleural effusion. Endotracheal tube tip is below the thoracic inlet but above the carina. Musculoskeletal: No acute fracture of the ribs, sternum for the visible portions of clavicles and scapulae. CT ABDOMEN PELVIS FINDINGS Hepatobiliary: No hepatic hematoma or laceration. No biliary dilatation. Normal gallbladder. Pancreas: Normal contours without ductal dilatation. No peripancreatic fluid collection. Spleen: No splenic laceration or hematoma. Adrenals/Urinary Tract: --Adrenal glands: No adrenal hemorrhage. --Right kidney/ureter: No  hydronephrosis or perinephric hematoma. --Left kidney/ureter: No hydronephrosis or perinephric hematoma. --Urinary bladder: Unremarkable. Stomach/Bowel: --Stomach/Duodenum: No hiatal hernia or other gastric abnormality. Normal duodenal course and caliber. --Small bowel: No dilatation or inflammation. --Colon: No focal abnormality. --Appendix: Not visualized. No right lower quadrant inflammation or free fluid. Vascular/Lymphatic: Normal course and caliber of the major abdominal vessels. No abdominal or pelvic lymphadenopathy. Reproductive: Normal uterus and ovaries. Musculoskeletal. No pelvic fractures. Other: Superficial right lateral abdominal hematoma measures 5.3 x 3.1 cm. IMPRESSION: 1. No acute abnormality of the chest, abdomen or pelvis. 2. Superficial right lateral abdominal wall hematoma. Electronically Signed   By: Deatra Robinson M.D.   On: 08/06/2019 05:15   MR BRAIN WO CONTRAST  Result Date: 08/10/2019 CLINICAL DATA:  Altered mental status. EXAM: MRI HEAD WITHOUT CONTRAST TECHNIQUE: Multiplanar, multiecho pulse sequences of the brain and surrounding structures were obtained without intravenous contrast. COMPARISON:  None. FINDINGS: Brain: Scattered small foci of restricted diffusion are seen within the bilateral cerebral hemispheres, consistent with small acute infarcts, likely embolic. No hemorrhage, hydrocephalus, extra-axial collection or mass lesion. Scattered confluent foci of T2 hyperintensity are seen within the white matter of the cerebral  hemispheres, within the thalami, pons and cerebellum, nonspecific remote lacunar infarcts are seen in the bilateral basal ganglia, bilateral centrum semiovale and corona radiata at the, right middle cerebellar peduncle and left cerebellar hemisphere. Findings are more pronounced than expected for age. Prominence of the cerebral sulci and ventricular system reflecting parenchymal volume loss. Vascular: Normal flow voids. Single A2 from incidentally noted Skull  and upper cervical spine: Normal marrow signal. Sinuses/Orbits: Mucosal thickening of the maxillary sinuses, sphenoid sinuses and ethmoid cells. The orbits are maintained. Other: Bilateral mastoid effusions. IMPRESSION: 1. Scattered small foci of restricted diffusion within the bilateral cerebral hemispheres, consistent with small acute infarcts, likely embolic. 2. Advanced white matter disease, more pronounced than expected for age. 3. Mild inflammatory paranasal sinus disease and bilateral mastoid effusions. These results were called by telephone at the time of interpretation on 08/10/2019 at 2:59 pm to provider CHI ELLISON , who verbally acknowledged these results. Electronically Signed   By: Baldemar Lenis M.D.   On: 08/10/2019 15:00   CT ABDOMEN PELVIS W CONTRAST  Result Date: 08/06/2019 CLINICAL DATA:  Fall. Cocaine use. Abdominal trauma. EXAM: CT CHEST, ABDOMEN, AND PELVIS WITH CONTRAST TECHNIQUE: Multidetector CT imaging of the chest, abdomen and pelvis was performed following the standard protocol during bolus administration of intravenous contrast. CONTRAST:  OMNIPAQUE IOHEXOL 300 MG/ML  SOLN COMPARISON:  None. FINDINGS: CT CHEST FINDINGS Cardiovascular: Heart size is normal without pericardial effusion. The thoracic aorta is normal in course and caliber without dissection, aneurysm, ulceration or intramural hematoma. Mediastinum/Nodes: No mediastinal hematoma. No mediastinal, hilar or axillary lymphadenopathy. The visualized thyroid and thoracic esophageal course are unremarkable. Lungs/Pleura: No pulmonary contusion, pneumothorax or pleural effusion. Endotracheal tube tip is below the thoracic inlet but above the carina. Musculoskeletal: No acute fracture of the ribs, sternum for the visible portions of clavicles and scapulae. CT ABDOMEN PELVIS FINDINGS Hepatobiliary: No hepatic hematoma or laceration. No biliary dilatation. Normal gallbladder. Pancreas: Normal contours without  ductal dilatation. No peripancreatic fluid collection. Spleen: No splenic laceration or hematoma. Adrenals/Urinary Tract: --Adrenal glands: No adrenal hemorrhage. --Right kidney/ureter: No hydronephrosis or perinephric hematoma. --Left kidney/ureter: No hydronephrosis or perinephric hematoma. --Urinary bladder: Unremarkable. Stomach/Bowel: --Stomach/Duodenum: No hiatal hernia or other gastric abnormality. Normal duodenal course and caliber. --Small bowel: No dilatation or inflammation. --Colon: No focal abnormality. --Appendix: Not visualized. No right lower quadrant inflammation or free fluid. Vascular/Lymphatic: Normal course and caliber of the major abdominal vessels. No abdominal or pelvic lymphadenopathy. Reproductive: Normal uterus and ovaries. Musculoskeletal. No pelvic fractures. Other: Superficial right lateral abdominal hematoma measures 5.3 x 3.1 cm. IMPRESSION: 1. No acute abnormality of the chest, abdomen or pelvis. 2. Superficial right lateral abdominal wall hematoma. Electronically Signed   By: Deatra Robinson M.D.   On: 08/06/2019 05:15   DG Chest 1V REPEAT Same Day  Result Date: 08/05/2019 CLINICAL DATA:  Hypoxia EXAM: CHEST - 1 VIEW SAME DAY COMPARISON:  August 05, 2019 study obtained earlier in the day FINDINGS: The endotracheal tube tip is now 1.4 cm above the carina. Nasogastric tube tip and side port in stomach. No pneumothorax. Lungs are clear. Heart size and pulmonary vascularity are normal. No adenopathy. No bone lesions. IMPRESSION: Tube positions as described without pneumothorax. Note that the endotracheal tube tip is now above the carina. Lungs clear. Heart size normal. Electronically Signed   By: Bretta Bang III M.D.   On: 08/05/2019 13:56   DG CHEST PORT 1 VIEW  Result Date: 08/07/2019 CLINICAL DATA:  Central line placement. EXAM: PORTABLE CHEST 1 VIEW COMPARISON:  August 05, 2019. FINDINGS: Endotracheal and nasogastric tubes are in good position. Interval placement of right  internal jugular catheter with distal tip in right atrium. No pneumothorax or pleural effusion is noted. Lungs are clear. Bony thorax is unremarkable. IMPRESSION: Interval placement of right internal jugular catheter with distal tip in right atrium; withdrawal by 2-3 cm is recommended. No pneumothorax is noted. Electronically Signed   By: Marijo Conception M.D.   On: 08/07/2019 10:25   DG Chest Port 1 View  Result Date: 08/05/2019 CLINICAL DATA:  Endotracheal tube position post transport. EXAM: PORTABLE CHEST 1 VIEW COMPARISON:  Earlier on the same date, 13:42 hours FINDINGS: An endotracheal tube tip projects 1.4 cm above the carina, similar to the prior study. An enteric tube none distal lumen continues to loop in the region of the gastric fundus which is decompressed. Lung inflation remain shallow. The cardiomediastinal silhouette remains normal. There is no consolidation, pneumothorax, interstitial edema or obvious pleural effusion. Persistent streaky interstitial opacities in the bilateral infrahilar regions are likely atelectatic. There are bone demineralization and mild degenerative changes. IMPRESSION: Stable ETT placement, its tip projecting 1.4 cm above the carina; a 2 cm retraction could be considered. Similar shallow lung inflation with bilateral infrahilar atelectasis. No pneumonia or pneumothorax. Electronically Signed   By: Revonda Humphrey   On: 08/05/2019 23:17   DG Chest Portable 1 View  Result Date: 08/05/2019 CLINICAL DATA:  Tube placement, post intubation EXAM: PORTABLE CHEST 1 VIEW COMPARISON:  02/13/2012 FINDINGS: Intubation of the right mainstem bronchus. Endotracheal tube is approximately 2 cm beyond the carina into the right mainstem bronchus. Film is rotated. Accounting for this cardiomediastinal contours are normal. Hazy opacity throughout the left chest likely reflects hypoinflation due to right mainstem intubation though subtle areas of airspace opacity are considered. No evidence of  pleural effusion. Hyperexpansion of the right chest. Gastric tube in the distal stomach, side port well below GE junction. Tip off the field of the radiograph. Visualized skeletal structures are unremarkable. IMPRESSION: 1. Intubation of the right mainstem bronchus with hypoinflation of the left chest. Difficult to exclude areas of airspace disease in the left chest. Suggest retraction of the endotracheal tube approximately 3 cm with repeat radiography to assess further. 2. Increased aeration of the right chest related to above. 3. Critical Value/emergent results were called by telephone at the time of interpretation on 08/05/2019 at 12:28 pm to provider Fredia Sorrow , who verbally acknowledged these results. Electronically Signed   By: Zetta Bills M.D.   On: 08/05/2019 12:28   DG Abd Portable 1V  Result Date: 08/05/2019 CLINICAL DATA:  Right flank hematoma EXAM: PORTABLE ABDOMEN - 1 VIEW COMPARISON:  None. FINDINGS: NG tube is in the stomach. Nonobstructive bowel gas pattern. No organomegaly or free air. No suspicious calcification. No acute bony abnormality. IMPRESSION: NG tube tip in the distal stomach. No bowel obstruction or free air. Electronically Signed   By: Rolm Baptise M.D.   On: 08/05/2019 23:59   DG Swallowing Func-Speech Pathology  Result Date: 08/12/2019 Objective Swallowing Evaluation: Type of Study: MBS-Modified Barium Swallow Study  Patient Details Name: AZALEE WEIMER MRN: [id number] Date of Birth: 1960-09-23 Today's Date: 08/12/2019 Time: SLP Start Time (ACUTE ONLY): 4098 -SLP Stop Time (ACUTE ONLY): 1330 SLP Time Calculation (min) (ACUTE ONLY): 15 min Past Medical History: Past Medical History: Diagnosis Date . Abnormal LFTs   Hx of . CVA (cerebral vascular accident) (  HCC) 2005 . Hyperlipidemia  . Hypertension  . Polysubstance dependence including opioid type drug, episodic abuse (HCC)   cocaine/etoh  Past Surgical History: No past surgical history on file. HPI: 59 y/o F who presented  [facility] with reports of altered mental status. Active seizures on arrival to ER.  Hx of polysubstance abuse. Intubated from 3/19-3/22.  No data recorded Assessment / Plan / Recommendation CHL IP CLINICAL IMPRESSIONS 08/12/2019 Clinical Impression MBS completed with pt exhibitng mild oropharyngeal deficits leading to penetration with thin liquids due to incomplete laryngeal closure. Questionable trace amount of barium reaching vocal cords versus higher in vestibule (very faint). Decreased cohesion with lingual residue in right buccal cavity. Swallow was slower to trigger with barium reaching valleculae and once pyriform sinsues before laryngeal protection completed. Recommend initiation of Dys 1, nectar thick liquids, straws allowed, aks pt to clear throat intermittently, full assist for feeding and swallow strategies. ST will continue to treat.     SLP Visit Diagnosis Dysphagia, oropharyngeal phase (R13.12) Attention and concentration deficit following -- Frontal lobe and executive function deficit following -- Impact on safety and function Mild aspiration risk   CHL IP TREATMENT RECOMMENDATION 08/12/2019 Treatment Recommendations Therapy as outlined in treatment plan below   Prognosis 08/12/2019 Prognosis for Safe Diet Advancement Fair Barriers to Reach Goals -- Barriers/Prognosis Comment -- CHL IP DIET RECOMMENDATION 08/12/2019 SLP Diet Recommendations Dysphagia 1 (Puree) solids;Nectar thick liquid Liquid Administration via Cup;No straw;Straw Medication Administration Crushed with puree Compensations Minimize environmental distractions;Slow rate;Small sips/bites;Clear throat intermittently Postural Changes Seated upright at 90 degrees   CHL IP OTHER RECOMMENDATIONS 08/12/2019 Recommended Consults -- Oral Care Recommendations Oral care BID Other Recommendations --   CHL IP FOLLOW UP RECOMMENDATIONS 08/12/2019 Follow up Recommendations Skilled Nursing facility   [facility] IP FREQUENCY AND DURATION 08/12/2019 Speech Therapy  Frequency (ACUTE ONLY) min 2x/week Treatment Duration 2 weeks      CHL IP ORAL PHASE 08/12/2019 Oral Phase Impaired Oral - Pudding Teaspoon -- Oral - Pudding Cup -- Oral - Honey Teaspoon -- Oral - Honey Cup WFL Oral - Nectar Teaspoon -- Oral - Nectar Cup Decreased bolus cohesion;Right anterior bolus loss Oral - Nectar Straw Decreased bolus cohesion Oral - Thin Teaspoon -- Oral - Thin Cup Decreased bolus cohesion;Delayed oral transit Oral - Thin Straw WFL Oral - Puree Delayed oral transit;Lingual/palatal residue Oral - Mech Soft -- Oral - Regular -- Oral - Multi-Consistency -- Oral - Pill -- Oral Phase - Comment --  CHL IP PHARYNGEAL PHASE 08/12/2019 Pharyngeal Phase Impaired Pharyngeal- Pudding Teaspoon -- Pharyngeal -- Pharyngeal- Pudding Cup -- Pharyngeal -- Pharyngeal- Honey Teaspoon -- Pharyngeal -- Pharyngeal- Honey Cup Delayed swallow initiation-vallecula Pharyngeal -- Pharyngeal- Nectar Teaspoon -- Pharyngeal -- Pharyngeal- Nectar Cup Delayed swallow initiation-vallecula Pharyngeal -- Pharyngeal- Nectar Straw WFL Pharyngeal -- Pharyngeal- Thin Teaspoon -- Pharyngeal -- Pharyngeal- Thin Cup Delayed swallow initiation-pyriform sinuses;Delayed swallow initiation-vallecula Pharyngeal -- Pharyngeal- Thin Straw Penetration/Aspiration during swallow Pharyngeal Material enters airway, CONTACTS cords and then ejected out;Material enters airway, CONTACTS cords and not ejected out Pharyngeal- Puree WFL Pharyngeal -- Pharyngeal- Mechanical Soft -- Pharyngeal -- Pharyngeal- Regular -- Pharyngeal -- Pharyngeal- Multi-consistency -- Pharyngeal -- Pharyngeal- Pill -- Pharyngeal -- Pharyngeal Comment --  CHL IP CERVICAL ESOPHAGEAL PHASE 08/12/2019 Cervical Esophageal Phase WFL Pudding Teaspoon -- Pudding Cup -- Honey Teaspoon -- Honey Cup -- Nectar Teaspoon -- Nectar Cup -- Nectar Straw -- Thin Teaspoon -- Thin Cup -- Thin Straw -- Puree -- Mechanical Soft -- Regular -- Multi-consistency --  Pill -- Cervical Esophageal Comment --  Elanora, Quin 08/12/2019, 3:23 PM              ECHOCARDIOGRAM COMPLETE  Result Date: 08/06/2019    ECHOCARDIOGRAM REPORT   Patient Name:   LAVENDER STANKE Date of Exam: 08/06/2019 Medical Rec #:  [id number]    Height:       64.0 in Accession #:    [id number]   Weight:       130.5 lb Date of Birth:  1961-05-11   BSA:          1.632 m Patient Age:    58 years     BP:           115/73 mmHg Patient Gender: F            HR:           55 bpm. Exam Location:  Inpatient Procedure: 2D Echo, Color Doppler and Cardiac Doppler Indications:    Elevated BNP  History:        Patient has no prior history of Echocardiogram examinations.                 Respiratory failure.  Sonographer:    Roosvelt Maser RDCS Referring Phys: [id number] Ivor Costa MEIER IMPRESSIONS  1. Left ventricular ejection fraction, by estimation, is 60 to 65%. The left ventricle has normal function. The left ventricle has no regional wall motion abnormalities. There is mild left ventricular hypertrophy. Left ventricular diastolic parameters are consistent with Grade I diastolic dysfunction (impaired relaxation).  2. Right ventricular systolic function is normal. The right ventricular size is normal.  3. The mitral valve is grossly normal. Mild mitral valve regurgitation.  4. The aortic valve is tricuspid. Aortic valve regurgitation is not visualized. FINDINGS  Left Ventricle: Left ventricular ejection fraction, by estimation, is 60 to 65%. The left ventricle has normal function. The left ventricle has no regional wall motion abnormalities. The left ventricular internal cavity size was normal in size. There is  mild left ventricular hypertrophy. Left ventricular diastolic parameters are consistent with Grade I diastolic dysfunction (impaired relaxation). Right Ventricle: The right ventricular size is normal. No increase in right ventricular wall thickness. Right ventricular systolic function is normal. Left Atrium: Left atrial size was normal in size. Right  Atrium: Right atrial size was normal in size. Pericardium: There is no evidence of pericardial effusion. Presence of pericardial fat pad. Mitral Valve: The mitral valve is grossly normal. Mild mitral valve regurgitation. Tricuspid Valve: The tricuspid valve is grossly normal. Tricuspid valve regurgitation is trivial. Aortic Valve: The aortic valve is tricuspid. Aortic valve regurgitation is not visualized. Mild aortic valve annular calcification. Pulmonic Valve: The pulmonic valve was grossly normal. Pulmonic valve regurgitation is trivial. Aorta: The aortic root is normal in size and structure. Venous: IVC assessment for right atrial pressure unable to be performed due to mechanical ventilation. IAS/Shunts: No atrial level shunt detected by color flow Doppler.  LEFT VENTRICLE PLAX 2D LVIDd:         3.20 cm     Diastology LVIDs:         2.10 cm     LV e' lateral:   6.74 cm/s LV PW:         1.20 cm     LV E/e' lateral: 9.7 LV IVS:        1.20 cm     LV e' medial:    3.57 cm/s LVOT diam:  1.90 cm     LV E/e' medial:  18.4 LV SV:         72 LV SV Index:   44 LVOT Area:     2.84 cm  LV Volumes (MOD) LV vol d, MOD A4C: 45.5 ml LV vol s, MOD A4C: 18.4 ml LV SV MOD A4C:     45.5 ml RIGHT VENTRICLE             IVC RV S prime:     10.10 cm/s  IVC diam: 1.80 cm TAPSE (M-mode): 1.2 cm LEFT ATRIUM           Index       RIGHT ATRIUM           Index LA diam:      2.10 cm 1.29 cm/m  RA Area:     10.70 cm LA Vol (A2C): 22.5 ml 13.79 ml/m RA Volume:   20.80 ml  12.75 ml/m LA Vol (A4C): 43.7 ml 26.78 ml/m  AORTIC VALVE LVOT Vmax:   91.40 cm/s LVOT Vmean:  72.400 cm/s LVOT VTI:    0.254 m  AORTA Ao Root diam: 3.00 cm Ao Asc diam:  3.10 cm MITRAL VALVE               TRICUSPID VALVE MV Area (PHT): 2.73 cm    TR Peak grad:   13.4 mmHg MV Decel Time: 278 msec    TR Vmax:        183.00 cm/s MV E velocity: 65.55 cm/s MV A velocity: 73.30 cm/s  SHUNTS MV E/A ratio:  0.89        Systemic VTI:  0.25 m                             Systemic Diam: 1.90 cm Nona DellSamuel Mcdowell MD Electronically signed by Nona DellSamuel Mcdowell MD Signature Date/Time: 08/06/2019/4:15:48 PM    Final    VAS US CAROTID  Result Date: 08/11/2019 Carotid Arterial Duplex Study Indications:       Altered mental status, active seizures and history of                    polysubstance abuse. Risk Factors:      Prior CVA. Comparison Study:  No priors. Performing Technologist: Marilynne Halstedita Sturdivant RDMS, RVT  Examination Guidelines: A complete evaluation includes B-mode imaging, spectral Doppler, color Doppler, and power Doppler as needed of [name] accessible portions of each vessel. Bilateral testing is considered an integral part of a complete examination. Limited examinations for reoccurring indications may be performed as noted.  Right Carotid Findings: +----------+--------+--------+--------+------------------+------------------+           PSV cm/sEDV cm/sStenosisPlaque DescriptionComments           +----------+--------+--------+--------+------------------+------------------+ CCA Prox  85      18                                                   +----------+--------+--------+--------+------------------+------------------+ CCA Distal59      17                                                   +----------+--------+--------+--------+------------------+------------------+  ICA Prox  57      12      1-39%                     intimal thickening +----------+--------+--------+--------+------------------+------------------+ ICA Distal65      22                                                   +----------+--------+--------+--------+------------------+------------------+ ECA       64      10                                                   +----------+--------+--------+--------+------------------+------------------+ +----------+--------+-------+----------------+-------------------+           PSV cm/sEDV cmsDescribe        Arm Pressure (mmHG)  +----------+--------+-------+----------------+-------------------+ [id number]            Multiphasic, WNL                    +----------+--------+-------+----------------+-------------------+ +---------+--------+--+--------+--+---------+ VertebralPSV cm/s66EDV cm/s19Antegrade +---------+--------+--+--------+--+---------+  Left Carotid Findings: +----------+--------+--------+--------+------------------+------------------+           PSV cm/sEDV cm/sStenosisPlaque DescriptionComments           +----------+--------+--------+--------+------------------+------------------+ CCA Prox  90      16                                                   +----------+--------+--------+--------+------------------+------------------+ CCA Distal57      16                                                   +----------+--------+--------+--------+------------------+------------------+ ICA Prox  44      15      1-39%                     intimal thickening +----------+--------+--------+--------+------------------+------------------+ ICA Distal62      24                                                   +----------+--------+--------+--------+------------------+------------------+ ECA       57      11                                                   +----------+--------+--------+--------+------------------+------------------+ +----------+--------+--------+----------------+-------------------+           PSV cm/sEDV cm/sDescribe        Arm Pressure (mmHG) +----------+--------+--------+----------------+-------------------+ [id number]             Multiphasic, WNL                    +----------+--------+--------+----------------+-------------------+ +---------+--------+--+--------+--+---------+  VertebralPSV cm/s49EDV cm/s11Antegrade +---------+--------+--+--------+--+---------+   Summary: Right Carotid: Velocities in the right ICA are consistent with a 1-39% stenosis.  Left Carotid: Velocities in the left ICA are consistent with a 1-39% stenosis. Vertebrals:  Bilateral vertebral arteries demonstrate antegrade flow. Subclavians: Normal flow hemodynamics were seen in bilateral subclavian              arteries. *See table(s) above for measurements and observations.  Electronically signed by Lemar Livings MD on 08/11/2019 at 4:37:38 PM.    Final    VAS [place] LOWER EXTREMITY VENOUS (DVT)  Result Date: 08/11/2019  Lower Venous DVTStudy Indications: Embolic stroke, active seizures and hx of polysubstance abuse.  Comparison Study: No priors. Performing Technologist: Marilynne Halsted RDMS, RVT  Examination Guidelines: A complete evaluation includes B-mode imaging, spectral Doppler, color Doppler, and power Doppler as needed of [name] accessible portions of each vessel. Bilateral testing is considered an integral part of a complete examination. Limited examinations for reoccurring indications may be performed as noted. The reflux portion of the exam is performed with the patient in reverse Trendelenburg.  +---------+---------------+---------+-----------+----------+--------------+ RIGHT    CompressibilityPhasicitySpontaneityPropertiesThrombus Aging +---------+---------------+---------+-----------+----------+--------------+ CFV      Full           Yes      Yes                                 +---------+---------------+---------+-----------+----------+--------------+ SFJ      Full                                                        +---------+---------------+---------+-----------+----------+--------------+ FV Prox  Full                                                        +---------+---------------+---------+-----------+----------+--------------+ FV Mid   Full                                                        +---------+---------------+---------+-----------+----------+--------------+ FV DistalFull                                                         +---------+---------------+---------+-----------+----------+--------------+ PFV      Full                                                        +---------+---------------+---------+-----------+----------+--------------+ POP      Full           Yes      Yes                                 +---------+---------------+---------+-----------+----------+--------------+  PTV      Full                                                        +---------+---------------+---------+-----------+----------+--------------+ PERO     Full                                                        +---------+---------------+---------+-----------+----------+--------------+   +---------+---------------+---------+-----------+----------+--------------+ LEFT     CompressibilityPhasicitySpontaneityPropertiesThrombus Aging +---------+---------------+---------+-----------+----------+--------------+ CFV      Full           Yes      Yes                                 +---------+---------------+---------+-----------+----------+--------------+ SFJ      Full                                                        +---------+---------------+---------+-----------+----------+--------------+ FV Prox  Full                                                        +---------+---------------+---------+-----------+----------+--------------+ FV Mid   Full                                                        +---------+---------------+---------+-----------+----------+--------------+ FV DistalFull                                                        +---------+---------------+---------+-----------+----------+--------------+ PFV      Full                                                        +---------+---------------+---------+-----------+----------+--------------+ POP      Full           Yes      Yes                                  +---------+---------------+---------+-----------+----------+--------------+ PTV      Full                                                        +---------+---------------+---------+-----------+----------+--------------+  PERO     Full                                                        +---------+---------------+---------+-----------+----------+--------------+     *See table(s) above for measurements and observations. Electronically signed by Lemar Livings MD on 08/11/2019 at 4:37:29 PM.    Final    VAS [place] TRANSCRANIAL DOPPLER  Result Date: 08/16/2019  Transcranial Doppler Indications: Stroke. Limitations for diagnostic windows: Unable to insonate right transtemporal window. Unable to insonate left transtemporal window. Comparison Study: No prior studies. Performing Technologist: Chanda Busing RVT  Examination Guidelines: A complete evaluation includes B-mode imaging, spectral Doppler, color Doppler, and power Doppler as needed of [name] accessible portions of each vessel. Bilateral testing is considered an integral part of a complete examination. Limited examinations for reoccurring indications may be performed as noted.  +----------+-------------+----------+-----------+-------+ RIGHT TCD Right VM (cm)Depth (cm)PulsatilityComment +----------+-------------+----------+-----------+-------+ Opthalmic     23.00                 1.55            +----------+-------------+----------+-----------+-------+ ICA siphon    21.00                 1.48            +----------+-------------+----------+-----------+-------+ Vertebral    -22.00                 1.41            +----------+-------------+----------+-----------+-------+ Distal ICA    64.00                 0.72            +----------+-------------+----------+-----------+-------+  +----------+------------+----------+-----------+-------+ LEFT TCD  Left VM (cm)Depth (cm)PulsatilityComment  +----------+------------+----------+-----------+-------+ Opthalmic    25.00                 1.51            +----------+------------+----------+-----------+-------+ ICA siphon   20.00                 1.46            +----------+------------+----------+-----------+-------+ Vertebral    -12.00                1.36            +----------+------------+----------+-----------+-------+ Distal ICA   19.00                 0.77            +----------+------------+----------+-----------+-------+  +------------+-------+-------+             VM cm/sComment +------------+-------+-------+ Prox Basilar-25.00  1.12   +------------+-------+-------+ Dist Basilar-21.00  1.18   +------------+-------+-------+ Summary:  Absent bitemporal windows limits exam. Normal mean flow velocities in both opthalmics,carotid siphons,and vertebral and basilar arteries. *See table(s) above for TCD measurements and observations.  Diagnosing physician: Delia Heady MD Electronically signed by Delia Heady MD on 08/16/2019 at 12:38:03 PM.    Final       The results of significant diagnostics from this hospitalization (including imaging, microbiology, ancillary and laboratory) are listed below for reference.     Microbiology: Recent Results (from the past 240 hour(s))  Culture, blood (Routine X 2) w Reflex to ID Panel  Status: None   Collection Time: 08/11/19  9:40 AM   Specimen: BLOOD  Result Value Ref Range Status   Specimen Description BLOOD LEFT ANTECUBITAL  Final   Special Requests   Final    BOTTLES DRAWN AEROBIC ONLY Blood Culture results may not be optimal due to an inadequate volume of blood received in culture bottles   Culture   Final    NO GROWTH 5 DAYS Performed at [facility], [address], [place], [place] [address]    Report Status 08/16/2019 FINAL  Final  Culture, blood (Routine X 2) w Reflex to ID Panel     Status: None   Collection Time: 08/11/19  9:53 AM   Specimen:  BLOOD LEFT HAND  Result Value Ref Range Status   Specimen Description BLOOD LEFT HAND  Final   Special Requests   Final    BOTTLES DRAWN AEROBIC AND ANAEROBIC Blood Culture adequate volume   Culture   Final    NO GROWTH 5 DAYS Performed at [facility], [address], [place], [place] [address]    Report Status 08/16/2019 FINAL  Final     Labs: BNP (last 3 results) Recent Labs    08/05/19 1201  BNP 121.0*   Basic Metabolic Panel: Recent Labs  Lab 08/13/19 0519 08/16/19 0043 08/17/19 0335 08/18/19 0303 08/19/19 0535  NA 130* 129* 129* 134* 135  K 4.5 5.0 3.9 4.0 3.3*  CL 98 99 102 104 101  CO2 20* 21* 19* 21* 22  GLUCOSE 133* 144* 123* 111* 101*  BUN 13 36* 33* 23* 23*  CREATININE 0.99 1.18* 1.19* 1.18* 1.14*  CALCIUM 9.3 8.6* 8.4* 8.8* 9.1  MG  --   --  1.8 1.7 1.6*  PHOS  --  6.2*  --   --   --    Liver Function Tests: Recent Labs  Lab 08/16/19 0043  ALBUMIN 2.5*   No results for input(s): LIPASE, AMYLASE in the last 168 hours. No results for input(s): AMMONIA in the last 168 hours. CBC: Recent Labs  Lab 08/16/19 0603 08/16/19 1550 08/17/19 0335 08/18/19 0303 08/19/19 0535  WBC  --   --  6.5 7.3 8.4  HGB 8.4* 7.4* 7.1* 8.8* 9.4*  HCT 24.9* 21.9* 21.5* 25.7* 27.9*  MCV  --   --  97.3 94.5 94.3  PLT  --   --  368 374 448*   Cardiac Enzymes: No results for input(s): CKTOTAL, CKMB, CKMBINDEX, TROPONINI in the last 168 hours. BNP: Invalid input(s): POCBNP CBG: Recent Labs  Lab 08/18/19 1622 08/18/19 1946 08/18/19 2324 08/19/19 0337 08/19/19 0801  GLUCAP 117* 123* 122* 126* 123*   D-Dimer No results for input(s): DDIMER in the last 72 hours. Hgb A1c No results for input(s): HGBA1C in the last 72 hours. Lipid Profile No results for input(s): CHOL, HDL, LDLCALC, TRIG, CHOLHDL, LDLDIRECT in the last 72 hours. Thyroid function studies No results for input(s): TSH, T4TOTAL, T3FREE, THYROIDAB in the last 72 hours.  Invalid input(s):  FREET3 Anemia work up No results for input(s): VITAMINB12, FOLATE, FERRITIN, TIBC, IRON, RETICCTPCT in the last 72 hours. Urinalysis    Component Value Date/Time   COLORURINE AMBER (A) 08/05/2019 2229   APPEARANCEUR HAZY (A) 08/05/2019 2229   LABSPEC 1.018 08/05/2019 2229   PHURINE 6.0 08/05/2019 2229   GLUCOSEU 50 (A) 08/05/2019 2229   HGBUR MODERATE (A) 08/05/2019 2229   BILIRUBINUR NEGATIVE 08/05/2019 2229   KETONESUR 20 (A) 08/05/2019 2229  PROTEINUR 100 (A) 08/05/2019 2229   NITRITE NEGATIVE 08/05/2019 2229   LEUKOCYTESUR SMALL (A) 08/05/2019 2229   Sepsis Labs Invalid input(s): PROCALCITONIN,  WBC,  LACTICIDVEN Microbiology Recent Results (from the past 240 hour(s))  Culture, blood (Routine X 2) w Reflex to ID Panel     Status: None   Collection Time: 08/11/19  9:40 AM   Specimen: BLOOD  Result Value Ref Range Status   Specimen Description BLOOD LEFT ANTECUBITAL  Final   Special Requests   Final    BOTTLES DRAWN AEROBIC ONLY Blood Culture results may not be optimal due to an inadequate volume of blood received in culture bottles   Culture   Final    NO GROWTH 5 DAYS Performed at [facility], [address] [place], [place] [address]    Report Status 08/16/2019 FINAL  Final  Culture, blood (Routine X 2) w Reflex to ID Panel     Status: None   Collection Time: 08/11/19  9:53 AM   Specimen: BLOOD LEFT HAND  Result Value Ref Range Status   Specimen Description BLOOD LEFT HAND  Final   Special Requests   Final    BOTTLES DRAWN AEROBIC AND ANAEROBIC Blood Culture adequate volume   Culture   Final    NO GROWTH 5 DAYS Performed at [facility], [address] [place], [place] [address]    Report Status 08/16/2019 FINAL  Final     Time coordinating discharge:  I have spent 35 minutes face to face with the patient and on the ward discussing the patients care, assessment, plan and disposition with other care givers. >50% of the time was devoted counseling  the patient about the risks and benefits of treatment/Discharge disposition and coordinating care.   SIGNED:   Dimple Nanas, MD  [facility] 08/19/2019, 10:29 AM   If 7PM-7AM, please contact night-coverage

## 2019-08-19 NOTE — H&P (Signed)
Physical Medicine and Rehabilitation Admission H&P        Chief Complaint  Patient presents with  . Altered Mental Status  : HPI: Cindy [name] is a 59 year old right-handed female with history of polysubstance abuse, hypertension, CVA 2005, hyperlipidemia.  Per chart review lives with spouse and independent prior to admission.  1 level home one-step to entry.  Presented 08/05/2019 to [facility] with altered mental status and actively seizing.  She was noted to have eye deviation to the right with twitching of the face.  She was treated with IV Ativan and loaded with Keppra.  Alcohol level 21, BNP 121, urine drug screen positive cocaine, lactic acid 2.7, urinalysis negative nitrate, sodium 124, creatinine 1.05, hemoglobin 14.9.  She did require intubation for airway protection.  Cranial CT scan negative for acute changes.  Extensive periventricular and deep white matter hypodensity markedly increased compared to remote prior CT dated 2006.  Patient did not receive TPA.  She was transferred to [facility] for further evaluation.  Echocardiogram with ejection fraction of 60% grade 1 diastolic dysfunction.  EEG negative for seizure.  Carotid Dopplers negative for ICA stenosis.  Follow-up MRI showed scattered small foci of restricted diffusion within the bilateral cerebral hemispheres consistent with small acute infarcts likely embolic.  Maintain on aspirin and Plavix x3 weeks followed by aspirin alone for CVA prophylaxis.  Subcutaneous Lovenox for DVT prophylaxis.  Gastroenterology services Dr. Ewing Schlein consulted 08/16/2019 for Hematemesis/anemia with hemoglobin dipping to 7.1 transfuse 1 unit packed red blood cells.  Patient underwent upper GI endoscopy 08/16/2019 showing medium size hiatal hernia present.  There was esophageal mucosal changes suspicious for short segment Barrett's esophagus present in the lower third of the esophagus.  1 cratered small distal esophageal ulcer with stigmata of  recent bleeding found.  The entire examined stomach was normal and patient was placed on Protonix twice daily.  Patient's aspirin and Plavix initially on hold and only Plavix has since been resumed.  Lovenox has been discontinued.  Hemoglobin remained stable at 8.8.  Dysphagia #1 nectar thick liquids.  Therapy evaluations completed and patient was admitted for a comprehensive rehab program.   Review of Systems  Constitutional: Negative for chills and fever.  HENT: Negative for hearing loss.   Eyes: Negative for blurred vision and double vision.  Respiratory: Negative for cough and shortness of breath.   Cardiovascular: Negative for chest pain.  Gastrointestinal: Positive for constipation. Negative for heartburn, nausea and vomiting.  Genitourinary: Negative for dysuria, flank pain and hematuria.  Musculoskeletal: Positive for myalgias.  Skin: Negative for rash.  Neurological: Positive for seizures, weakness and headaches.  All other systems reviewed and are negative.       Past Medical History:  Diagnosis Date  . Abnormal LFTs      Hx of  . CVA (cerebral vascular accident) (HCC) 2005  . Hyperlipidemia    . Hypertension    . Polysubstance dependence including opioid type drug, episodic abuse (HCC)      cocaine/etoh     History reviewed. No pertinent surgical history.      Family History  Problem Relation Age of Onset  . Cancer Father          prostate   . Hypertension Mother    . Asthma Mother    . Bronchitis Mother      Social History:  reports that she has never smoked. She has never used smokeless tobacco. She reports current alcohol use. She reports  that she does not use drugs. Allergies: No Known Allergies       Medications Prior to Admission  Medication Sig Dispense Refill  . acetaminophen (TYLENOL) 500 MG tablet Take 1,000 mg by mouth every 6 (six) hours as needed for mild pain or headache.      Marland Kitchen amLODipine (NORVASC) 10 MG tablet TAKE 1 TABLET BY MOUTH EVERY DAY  (NEEDS TO MAKE AN APPOINTMENT) (Patient not taking: Reported on 08/07/2019) 30 tablet 0  . simvastatin (ZOCOR) 20 MG tablet Take 1 tablet (20 mg total) by mouth daily. (Patient not taking: Reported on 08/07/2019) 30 tablet 3      Drug Regimen Review Drug regimen was reviewed and remains appropriate with no significant issues identified   Home: Home Living Family/patient expects to be discharged to:: Private residence Living Arrangements: Spouse/significant other Available Help at Discharge: Family, Available 24 hours/day Type of Home: House Home Access: Stairs to enter Secretary/administrator of Steps: 1 Home Layout: One level Bathroom Shower/Tub: Engineer, manufacturing systems: Standard Home Equipment: Cane - single point, Information systems manager Additional Comments: unsure of accuracy of home setup as pt with impaired cognition today, very soft spoken   Functional History: Prior Function Level of Independence: Independent   Functional Status:  Mobility: Bed Mobility Overal bed mobility: Needs Assistance Bed Mobility: Supine to Sit, Sit to Supine Supine to sit: HOB elevated, Min assist Sit to supine: Mod assist, +2 for physical assistance, HOB elevated General bed mobility comments: Use of bed rail, good initiation. Assist for trunk control upon sitting upright.  Transfers Overall transfer level: Needs assistance Equipment used: Ambulation equipment used Transfer via Lift Equipment: Stedy Transfers: Sit to/from Stand, Pharmacologist Sit to Stand: +2 safety/equipment, Mod assist, Min assist Stand pivot transfers: Total assist General transfer comment: Min assist x 2 for sit/stand from EOB. Mod assist x 2 for sit/stand from bedside chair with hand held assist. Used stedy to transfer to bedside chair.  Ambulation/Gait General Gait Details: pregait activities in stedy to increase standing tolerance and balance, not yet ready to step   ADL: ADL Overall ADL's : Needs  assistance/impaired Eating/Feeding: Set up, Supervision/ safety, Sitting Eating/Feeding Details (indicate cue type and reason): Pt able to grasp and manipulate utensils with right hand. Noted 0 drops throughout. Pt able to reach, grasp, and bring drink to mouth.  Grooming: Wash/dry hands, Wash/dry face, Set up, Supervision/safety, Sitting Toileting- Clothing Manipulation and Hygiene: Maximal assistance, Sit to/from stand Toileting - Clothing Manipulation Details (indicate cue type and reason): Assist for peri care and clothing management in standing.  Functional mobility during ADLs: (Utilized sara stedy for transfer.) General ADL Comments: Pt tolerated sitting EOB 15+ min with variable min guard to mod assist to maintain balance. Pt engaged in self-care tasks while seated in bedside chair.    Cognition: Cognition Overall Cognitive Status: Impaired/Different from baseline Orientation Level: Oriented to person, Oriented to place, Oriented to time, Disoriented to situation Cognition Arousal/Alertness: Awake/alert Behavior During Therapy: WFL for tasks assessed/performed Overall Cognitive Status: Impaired/Different from baseline Area of Impairment: Orientation, Safety/judgement, Following commands, Awareness, Problem solving, Memory, Attention Orientation Level: Disoriented to, Time, Situation Current Attention Level: Sustained Memory: Decreased short-term memory Following Commands: Follows one step commands consistently Safety/Judgement: Decreased awareness of safety, Decreased awareness of deficits Awareness: Intellectual Problem Solving: Difficulty sequencing, Slow processing, Requires verbal cues, Requires tactile cues General Comments: Pt pleasant and willing to participate in therapy. Pt easily distracted by environment.    Physical Exam: Blood pressure  122/76, pulse 97, temperature 98.2 F (36.8 C), temperature source Oral, resp. rate 18, height 5\' 4"  (1.626 m), weight 73.9 kg, SpO2  100 %. Physical Exam  Neurological:  Alert no acute distress and follows commands.  She is oriented to self but some delay in processing for a year and date.    General: No acute distress Mood and affect are appropriate Heart: Regular rate and rhythm no rubs murmurs or extra sounds Lungs: Clear to auscultation, breathing unlabored, no rales or wheezes Abdomen: Positive bowel sounds, soft nontender to palpation, nondistended Extremities: No clubbing, cyanosis, or edema Skin: No evidence of breakdown, no evidence of rash Neurologic: Cranial nerves II through XII intact, motor strength is 5/5 in bilateral deltoid, bicep, tricep, grip, hip flexor, knee extensors, ankle dorsiflexor and plantar flexor Sensory exam normal sensation to light touch and proprioception in bilateral upper and lower extremities Cerebellar exam dysmetria mild /moderate Right and  Normal Left  finger to nose to finger as well as heel to shin  Musculoskeletal: Full range of motion in all 4 extremities. No joint swelling   Lab Results Last 48 Hours        Results for orders placed or performed during the hospital encounter of 08/05/19 (from the past 48 hour(s))  Glucose, capillary     Status: Abnormal    Collection Time: 08/13/19  5:27 PM  Result Value Ref Range    Glucose-Capillary 131 (H) 70 - 99 mg/dL      Comment: Glucose reference range applies only to samples taken after fasting for at least 8 hours.  Glucose, capillary     Status: Abnormal    Collection Time: 08/13/19  8:22 PM  Result Value Ref Range    Glucose-Capillary 105 (H) 70 - 99 mg/dL      Comment: Glucose reference range applies only to samples taken after fasting for at least 8 hours.  Glucose, capillary     Status: Abnormal    Collection Time: 08/14/19 12:22 AM  Result Value Ref Range    Glucose-Capillary 120 (H) 70 - 99 mg/dL      Comment: Glucose reference range applies only to samples taken after fasting for at least 8 hours.  Glucose,  capillary     Status: Abnormal    Collection Time: 08/14/19  4:38 AM  Result Value Ref Range    Glucose-Capillary 114 (H) 70 - 99 mg/dL      Comment: Glucose reference range applies only to samples taken after fasting for at least 8 hours.  Glucose, capillary     Status: Abnormal    Collection Time: 08/14/19  7:40 AM  Result Value Ref Range    Glucose-Capillary 134 (H) 70 - 99 mg/dL      Comment: Glucose reference range applies only to samples taken after fasting for at least 8 hours.  Glucose, capillary     Status: Abnormal    Collection Time: 08/14/19 10:59 AM  Result Value Ref Range    Glucose-Capillary 104 (H) 70 - 99 mg/dL      Comment: Glucose reference range applies only to samples taken after fasting for at least 8 hours.  Glucose, capillary     Status: Abnormal    Collection Time: 08/14/19  5:31 PM  Result Value Ref Range    Glucose-Capillary 111 (H) 70 - 99 mg/dL      Comment: Glucose reference range applies only to samples taken after fasting for at least 8 hours.  Glucose, capillary  Status: Abnormal    Collection Time: 08/14/19  9:33 PM  Result Value Ref Range    Glucose-Capillary 114 (H) 70 - 99 mg/dL      Comment: Glucose reference range applies only to samples taken after fasting for at least 8 hours.  Glucose, capillary     Status: Abnormal    Collection Time: 08/14/19 11:45 PM  Result Value Ref Range    Glucose-Capillary 131 (H) 70 - 99 mg/dL      Comment: Glucose reference range applies only to samples taken after fasting for at least 8 hours.  Glucose, capillary     Status: Abnormal    Collection Time: 08/15/19  4:44 AM  Result Value Ref Range    Glucose-Capillary 109 (H) 70 - 99 mg/dL      Comment: Glucose reference range applies only to samples taken after fasting for at least 8 hours.  Glucose, capillary     Status: Abnormal    Collection Time: 08/15/19  8:46 AM  Result Value Ref Range    Glucose-Capillary 118 (H) 70 - 99 mg/dL      Comment: Glucose  reference range applies only to samples taken after fasting for at least 8 hours.    Comment 1 Notify RN      Comment 2 Document in Chart    Glucose, capillary     Status: Abnormal    Collection Time: 08/15/19 12:17 PM  Result Value Ref Range    Glucose-Capillary 125 (H) 70 - 99 mg/dL      Comment: Glucose reference range applies only to samples taken after fasting for at least 8 hours.       Imaging Results (Last 48 hours)  VAS [place] TRANSCRANIAL DOPPLER   Result Date: 08/15/2019  Transcranial Doppler Indications: Stroke. Limitations for diagnostic windows: Unable to insonate right transtemporal window. Unable to insonate left transtemporal window. Comparison Study: No prior studies. Performing Technologist: Oliver Hum RVT  Examination Guidelines: A complete evaluation includes B-mode imaging, spectral Doppler, color Doppler, and power Doppler as needed of all accessible portions of each vessel. Bilateral testing is considered an integral part of a complete examination. Limited examinations for reoccurring indications may be performed as noted.  +----------+-------------+----------+-----------+-------+ RIGHT TCD Right VM (cm)Depth (cm)PulsatilityComment +----------+-------------+----------+-----------+-------+ Opthalmic     23.00                 1.55            +----------+-------------+----------+-----------+-------+ ICA siphon    21.00                 1.48            +----------+-------------+----------+-----------+-------+ Vertebral    -22.00                 1.41            +----------+-------------+----------+-----------+-------+ Distal ICA    64.00                 0.72            +----------+-------------+----------+-----------+-------+  +----------+------------+----------+-----------+-------+ LEFT TCD  Left VM (cm)Depth (cm)PulsatilityComment +----------+------------+----------+-----------+-------+ Opthalmic    25.00                 1.51             +----------+------------+----------+-----------+-------+ ICA siphon   20.00                 1.46            +----------+------------+----------+-----------+-------+  Vertebral    -12.00                1.36            +----------+------------+----------+-----------+-------+ Distal ICA   19.00                 0.77            +----------+------------+----------+-----------+-------+  +------------+-------+-------+             VM cm/sComment +------------+-------+-------+ Prox Basilar-25.00  1.12   +------------+-------+-------+ Dist Basilar-21.00  1.18   +------------+-------+-------+    Preliminary              Medical Problem List and Plan: 1.  Right side weakness with decreased balance and dysphagia secondary to bilateral cerebral convexity infarct frontal region             -patient may  shower             -ELOS/Goals: 5-7d Mod I/Sup ADL and mobility  2.  Antithrombotics: -DVT/anticoagulation: SCDs             -antiplatelet therapy: ONLY plavix for now 3. Pain Management: Tylenol as needed 4. Mood: Provide emotional support             -antipsychotic agents Seroquel 25 mg nightly 5. Neuropsych: This patient is capable of making decisions on her own behalf. 6. Skin/Wound Care: Routine skin checks 7. Fluids/Electrolytes/Nutrition: Routine in and outs with follow-up chemistries 8.  Dysphagia.  Dysphagia #1 nectar liquids.  Follow-up speech therapy 9.  Hypertension.  Norvasc 10 mg daily, clonidine 0.1 mg twice daily.  Monitor with increased mobility 10.  Seizure prophylaxis.  Keppra 500 mg daily 11.  Hyperlipidemia.  Zocor 12.  History of polysubstance abuse alcohol use.  Urine drug screen positive cocaine.  Counseling 13.  Hematemesis.  Upper GI endoscopy 08/16/2019.  Continue Protonix twice daily.    Mcarthur Rossetti Angiulli, PA-C 08/15/2019  "I have personally performed a face to face diagnostic evaluation of this patient.  Additionally, I have reviewed and concur  with the physician assistant's documentation above."  Erick Colace M.D. [facility] FAAPM&R (Neuromuscular Med) Diplomate Am Board of Electrodiagnostic Med Fellow Am Board of Interventional Pain

## 2019-08-19 NOTE — Progress Notes (Signed)
PMR Admission Coordinator Pre-Admission Assessment   Patient: Cindy [name] is an 59 y.o., female MRN: [id number] DOB: 19-Jun-1960 Height: 5\' 4"  (162.6 cm) Weight: 72.6 kg                                                                                                                                                  Insurance Information HMO:     PPO:      PCP:      IPA:      80/20:      OTHER:  PRIMARY: uninsured     is Artist [phone]   Medicaid Application Date:       Case Manager:  Disability Application Date:       Case Worker:    The "Data Collection Information Summary" for patients in Inpatient Rehabilitation Facilities with attached "Privacy Act Statement-Health Care Records" was provided and verbally reviewed with: N/A   Emergency Contact Information Contact Information     Name Relation Home Work Mobile    [place] Spouse     [phone]    Cindy, [name] [phone]   [phone]       Current Medical History  Patient Admitting Diagnosis: CVA  History of Present Illness: Cindy [name]. Cindy [name] is a 59 year old right-handed female with history of polysubstance abuse, hypertension, CVA 2005, hyperlipidemia.  Per chart review lives with spouse and independent prior to admission.  1 level home one-step to entry.  Presented 08/05/2019 to [facility] hospital with altered mental status and actively seizing.  She was noted to have eye deviation to the right with twitching of the face.  She was treated with IV Ativan and loaded with Keppra.  Alcohol level 21, BNP 121, urine drug screen positive cocaine, lactic acid 2.7, urinalysis negative nitrate, sodium 124, creatinine 1.05.  She did require intubation for airway protection.  Cranial CT scan negative for acute changes.  Extensive periventricular and deep white matter hypodensity markedly increased compared to remote prior CT dated 2006.  Patient did not receive TPA.  She was transferred to [facility] for further evaluation.  Echocardiogram with ejection fraction of 60% grade 1 diastolic dysfunction.  EEG negative for seizure.  Carotid Dopplers negative for ICA stenosis.  Follow-up MRI showed scattered small foci of restricted diffusion within the bilateral cerebral hemispheres consistent with small acute infarcts likely embolic.  Maintain on aspirin and Plavix x3 weeks followed by aspirin alone for CVA prophylaxis.  Subcutaneous Lovenox for DVT prophylaxis.  [facility]enter[facility] services [facility] consulted 08/16/2019 for Hematemesis/anemia with upper GI endoscopy 08/16/2019 showing medium size hiatal hernia present.  There was esophageal mucosal changes suspicious for short segment Barrett's esophagus present in the lower third of the esophagus.  1 cratered small distal esophageal ulcer with stigmata of recent bleeding found.  The entire examined stomach was normal  and patient was placed on Protonix twice daily.  Dysphagia #1 nectar thick liquids.  Therapy evaluations were completed and pt was recommended for CIR.    Past Medical History      Past Medical History:  Diagnosis Date  . Abnormal LFTs      Hx of  . CVA (cerebral vascular accident) (HCC) 2005  . Hyperlipidemia    . Hypertension    . Polysubstance dependence including opioid type drug, episodic abuse (HCC)      cocaine/etoh       Family History  family history includes Asthma in her mother; Bronchitis in her mother; Cancer in her father; Hypertension in her mother.   Prior Rehab/Hospitalizations:  Has the patient had prior rehab or hospitalizations prior to admission? yes   Has the patient had major surgery during 100 days prior to admission? Yes   Current Medications    Current Facility-Administered Medications:  .  0.9 %  sodium chloride infusion, , Intravenous, Continuous, Vida Rigger, MD, Stopped at 08/08/19 1952 .  bisacodyl (DULCOLAX) suppository 10 mg, 10 mg, Rectal, Daily PRN, Vida Rigger, MD .  chlorhexidine  (PERIDEX) 0.12 % solution 15 mL, 15 mL, Mouth Rinse, BID, Vida Rigger, MD, 15 mL at 08/19/19 0851 .  clopidogrel (PLAVIX) tablet 75 mg, 75 mg, Oral, Daily, Amin, Ankit Chirag, MD, 75 mg at 08/19/19 0851 .  hydrALAZINE (APRESOLINE) injection 10 mg, 10 mg, Intravenous, Q4H PRN, Vida Rigger, MD, 10 mg at 08/10/19 0242 .  insulin aspart (novoLOG) injection 0-9 Units, 0-9 Units, Subcutaneous, Q4H, Vida Rigger, MD, 1 Units at 08/19/19 0850 .  levETIRAcetam (KEPPRA XR) 24 hr tablet 500 mg, 500 mg, Oral, Daily, Vida Rigger, MD, 500 mg at 08/19/19 0851 .  lip balm (CARMEX) ointment, , Topical, PRN, Vida Rigger, MD .  LORazepam (ATIVAN) injection 1 mg, 1 mg, Intravenous, Q4H PRN, Vida Rigger, MD, 1 mg at 08/19/19 9381 .  magnesium oxide (MAG-OX) tablet 800 mg, 800 mg, Oral, Q4H, Amin, Ankit Chirag, MD, 800 mg at 08/19/19 0850 .  MEDLINE mouth rinse, 15 mL, Mouth Rinse, q12n4p, Vida Rigger, MD, 15 mL at 08/18/19 1658 .  metoprolol tartrate (LOPRESSOR) injection 5 mg, 5 mg, Intravenous, Q6H PRN, Vida Rigger, MD, 5 mg at 08/10/19 0710 .  multivitamin liquid 15 mL, 15 mL, Oral, Daily, Vida Rigger, MD, 15 mL at 08/19/19 0851 .  pantoprazole (PROTONIX) EC tablet 40 mg, 40 mg, Oral, BID AC, Amin, Ankit Chirag, MD, 40 mg at 08/19/19 0637 .  potassium chloride SA (KLOR-CON) CR tablet 40 mEq, 40 mEq, Oral, Q4H, Amin, Ankit Chirag, MD, 40 mEq at 08/19/19 0850 .  QUEtiapine (SEROQUEL) tablet 25 mg, 25 mg, Oral, QHS, Vida Rigger, MD, 25 mg at 08/18/19 2144 .  Resource ThickenUp Clear, , Oral, PRN, Vida Rigger, MD .  sennosides (SENOKOT) 8.8 MG/5ML syrup 5 mL, 5 mL, Per Tube, BID PRN, Vida Rigger, MD .  simvastatin (ZOCOR) tablet 20 mg, 20 mg, Oral, q1800, Vida Rigger, MD, 20 mg at 08/18/19 1658   Patients Current Diet:     Diet Order                      DIET SOFT Room service appropriate? Yes; Fluid consistency: Thin  Diet effective now                   Precautions / Restrictions Precautions Precautions:  Fall Restrictions Weight Bearing Restrictions: No    Has the patient  had 2 or more falls or a fall with injury in the past year?No   Prior Activity Level Limited Community (1-2x/wk): Independent; unemployed    Prior Functional Level Prior Function Level of Independence: Independent   Self Care: Did the patient need help bathing, dressing, using the toilet or eating?  Independent   Indoor Mobility: Did the patient need assistance with walking from room to room (with or without device)? Independent   Stairs: Did the patient need assistance with internal or external stairs (with or without device)? Independent   Functional Cognition: Did the patient need help planning regular tasks such as shopping or remembering to take medications? Independent   Home Assistive Devices / Equipment Home Assistive Devices/Equipment: None Home Equipment: Cane - single point, Shower seat   Prior Device Use: Indicate devices/aids used by the patient prior to current illness, exacerbation or injury? None of the above   Current Functional Level Cognition   Overall Cognitive Status: Impaired/Different from baseline Current Attention Level: Sustained Orientation Level: Oriented to person, Oriented to place, Oriented to time, Disoriented to situation Following Commands: Follows one step commands consistently, Follows multi-step commands consistently Safety/Judgement: Decreased awareness of safety, Decreased awareness of deficits General Comments: Pt pleasant and willing to participate in therapy. Pt easily distracted by environment.     Extremity Assessment (includes Sensation/Coordination)   Upper Extremity Assessment: Generalized weakness  Lower Extremity Assessment: Defer to PT evaluation     ADLs   Overall ADL's : Needs assistance/impaired Eating/Feeding: Set up, Supervision/ safety, Sitting Eating/Feeding Details (indicate cue type and reason): Pt able to grasp and manipulate utensils with right  hand. Noted 0 drops throughout. Pt able to reach, grasp, and bring drink to mouth.  Grooming: Wash/dry hands, Wash/dry face, Set up, Supervision/safety, Sitting Toileting- Clothing Manipulation and Hygiene: Maximal assistance, Sit to/from stand Toileting - Clothing Manipulation Details (indicate cue type and reason): Assist for peri care and clothing management in standing.  Functional mobility during ADLs: (Utilized sara stedy for transfer.) General ADL Comments: Pt tolerated sitting EOB 15+ min with variable min guard to mod assist to maintain balance. Pt engaged in self-care tasks while seated in bedside chair.      Mobility   Overal bed mobility: Needs Assistance Bed Mobility: Supine to Sit, Sit to Supine Supine to sit: Supervision Sit to supine: HOB elevated, Supervision General bed mobility comments: Use of bed rail, good initiation.     Transfers   Overall transfer level: Needs assistance Equipment used: None, 1 person hand held assist, Rolling walker (2 wheeled) Transfer via Lift Equipment: Stedy Transfers: Sit to/from Stand Sit to Stand: Min guard Stand pivot transfers: Total assist General transfer comment: pt able to complete 5xSTS with use of armrests but no AD in 40 sec, min G for safety, at other times, used RW or HHA of 1, and was able to complete without phsyical assist     Ambulation / Gait / Stairs / Wheelchair Mobility   Ambulation/Gait Ambulation/Gait assistance: Land (Feet): 15 Feet(15 ft x2) Assistive device: Rolling walker (2 wheeled), 1 person hand held assist Gait Pattern/deviations: Step-to pattern, Decreased stride length, Trunk flexed General Gait Details: pt able to demo slow but steady ambulation in room, taking short steps with minimal clearance, but no LOB. very slow with turns, improved stability with RW Gait velocity interpretation: <1.31 ft/sec, indicative of household ambulator     Posture / Balance Dynamic Sitting  Balance Sitting balance - Comments: pt with multiple LOB in  seated position while attempting to don socks, was able to correct and return to sitting upright without assist but unable to prevent further LOB Balance Overall balance assessment: Needs assistance Sitting-balance support: Feet supported, Bilateral upper extremity supported Sitting balance-Leahy Scale: Fair Sitting balance - Comments: pt with multiple LOB in seated position while attempting to don socks, was able to correct and return to sitting upright without assist but unable to prevent further LOB Postural control: Right lateral lean, Posterior lean Standing balance support: Bilateral upper extremity supported Standing balance-Leahy Scale: Fair Standing balance comment: able to complete sit-stand without AD, benefits from RW for ambulation     Special needs/care consideration BiPAP/CPAP n/a CPM n/a Continuous Drip IV no Dialysis n/a Life Vest n/a Oxygen n/a Special Bed seizure precautions Trach Size n/a Wound Vac n/a Skin left ankle abrasion; ecchymosis to bilateral hips; rash to buttocks Bowel mgmt: incontinent Bladder mgmt: incontinent Diabetic mgmt n/a Behavioral consideration  N/a Chemo/radiation  N/a Designated visitor is spouse, Biochemist, clinical    Previous Home Environment  Living Arrangements: Spouse/significant other  Lives With: Spouse Available Help at Discharge: Family, Available 24 hours/day Type of Home: House Home Layout: One level Home Access: Stairs to enter Technical brewer of Steps: 1 Bathroom Shower/Tub: Optometrist: Yes How Accessible: Accessible via walker Home Care Services: No Additional Comments: unsure of accuracy of home setup as pt with impaired cognition today, very soft spoken   Discharge Living Setting Plans for Discharge Living Setting: Patient's home, Lives with (comment)(spouse) Type of Home at Discharge: House Discharge Home  Layout: One level Discharge Home Access: Stairs to enter Entrance Stairs-Rails: None Entrance Stairs-Number of Steps: 1 Discharge Bathroom Shower/Tub: Tub/shower unit Discharge Bathroom Toilet: Standard Discharge Bathroom Accessibility: Yes How Accessible: Accessible via walker Does the patient have any problems obtaining your medications?: Yes (Describe)(uninsured)   Social/Family/Support Systems Patient Roles: Spouse Contact Information: spouse, Biochemist, clinical Anticipated Caregiver: spouse Anticipated Caregiver's Contact Information: [phone] Ability/Limitations of Caregiver: no limitations Caregiver Availability: 24/7 Discharge Plan Discussed with Primary Caregiver: Yes Is Caregiver In Agreement with Plan?: Yes Does Caregiver/Family have Issues with Lodging/Transportation while Pt is in Rehab?: No   Goals/Additional Needs Patient/Family Goal for Rehab: supervision PT, OT, and SLP Expected length of stay: ELOS 7 to 10 days Dietary Needs: soft/thin Pt/Family Agrees to Admission and willing to participate: Yes Program Orientation Provided & Reviewed with Pt/Caregiver Including Roles  & Responsibilities: Yes   Decrease burden of Care through IP rehab admission: n/a   Possible need for SNF placement upon discharge: n/a   Patient Condition: This patient's medical and functional status has changed since the consult dated: 08/15/2019 in which the Rehabilitation Physician determined and documented that the patient's condition is appropriate for intensive rehabilitative care in an inpatient rehabilitation facility. See "History of Present Illness" (above) for medical update. Functional changes are: min assist. Patient's medical and functional status update has been discussed with the Rehabilitation physician and patient remains appropriate for inpatient rehabilitation. Will admit to inpatient rehab today.   Preadmission Screen Completed By: Danne Baxter, RN MSN with updates by Michel Santee, PT, DPT 08/19/2019 10:32 AM ______________________________________________________________________   Discussed status with Dr. Letta Pate on 08/19/19 at  10:34 AM  and received approval for admission today.   Admission Coordinator: Danne Baxter, RN MSN, and Michel Santee, [place], DPT, time 10:34 AM Sudie Grumbling 08/19/19          Cosigned by: Charlett Blake, MD at 08/19/2019 10:50 AM

## 2019-08-19 NOTE — Progress Notes (Signed)
Physical Medicine and Rehabilitation Consult Reason for Consult: Decreased functional mobility Referring Physician: [facility]     HPI: Cindy [name] is a 59 y.o. right-handed female with history of polysubstance abuse, hypertension, CVA 2005, hyperlipidemia.  Per chart review patient lives with spouse.  Independent prior to admission.  1 level home with one-step to entry.  Presented 08/05/2019 to [facility] hospital with altered mental status and actively seizing.  She was noted to have eye deviation to the right with twitching of the face.  She was treated with IV Ativan and loaded with Keppra.  Alcohol level 11, BNP 121, urine drug screen positive cocaine, lactic acid 2.7, urinalysis negative nitrite, sodium 124, creatinine 1.05.  Patient did require intubation for airway protection.  Cranial CT scan negative for acute changes.  Extensive periventricular and deep white matter hypodensity markedly increased compared to remote prior CT dated 2006.  Patient was transferred to [facility] for further evaluation.  Echocardiogram with ejection fraction of 60% grade 1 diastolic dysfunction.  EEG negative for seizure.  Carotid Dopplers negative for ICA stenosis.  Follow-up MRI showed scattered small foci of restricted diffusion within the bilateral cerebral hemispheres consistent with small acute infarcts likely embolic.  Currently maintained on aspirin for CVA prophylaxis.  Subcutaneous Lovenox for DVT prophylaxis.  Dysphagia #1 nectar thick liquids.  Therapy evaluations completed with recommendations of physical medicine rehab consult.   No issues overnight, was out of bed today with one-person assistance.  She is drinking thickened liquid without signs of coughing     Review of Systems  Constitutional: Negative for chills and fever.  HENT: Negative for hearing loss.   Eyes: Negative for blurred vision and double vision.  Respiratory: Negative for cough and shortness of breath.     Cardiovascular: Negative for chest pain, palpitations and leg swelling.  Gastrointestinal: Positive for constipation. Negative for heartburn, nausea and vomiting.  Genitourinary: Negative for dysuria, flank pain and hematuria.  Musculoskeletal: Positive for myalgias.  Skin: Negative for rash.  Neurological: Positive for seizures, weakness and headaches.  All other systems reviewed and are negative.       Past Medical History:  Diagnosis Date  . Abnormal LFTs      Hx of  . CVA (cerebral vascular accident) (HCC) 2005  . Hyperlipidemia    . Hypertension    . Polysubstance dependence including opioid type drug, episodic abuse (HCC)      cocaine/etoh     History reviewed. No pertinent surgical history.      Family History  Problem Relation Age of Onset  . Cancer Father          prostate   . Hypertension Mother    . Asthma Mother    . Bronchitis Mother      Social History:  reports that she has never smoked. She has never used smokeless tobacco. She reports current alcohol use. She reports that she does not use drugs. Allergies: No Known Allergies       Medications Prior to Admission  Medication Sig Dispense Refill  . acetaminophen (TYLENOL) 500 MG tablet Take 1,000 mg by mouth every 6 (six) hours as needed for mild pain or headache.      Marland Kitchen amLODipine (NORVASC) 10 MG tablet TAKE 1 TABLET BY MOUTH EVERY DAY (NEEDS TO MAKE AN APPOINTMENT) (Patient not taking: Reported on 08/07/2019) 30 tablet 0  . simvastatin (ZOCOR) 20 MG tablet Take 1 tablet (20 mg  total) by mouth daily. (Patient not taking: Reported on 08/07/2019) 30 tablet 3      Home: [place] expects to be discharged to:: Private residence Living Arrangements: Spouse/significant other Available Help at Discharge: Family, Available 24 hours/day Type of Home: House Home Access: Stairs to enter Technical brewer of Steps: 1 Home Layout: One level Bathroom Shower/Tub: Chiropodist:  Standard Home Equipment: Cane - single point, Electronics engineer Comments: unsure of accuracy of home setup as pt with impaired cognition today, very soft spoken  Functional History: Prior Function Level of Independence: Independent Functional Status:  Mobility: Bed Mobility Overal bed mobility: Needs Assistance Bed Mobility: Supine to Sit, Sit to Supine Supine to sit: Mod assist, HOB elevated, +2 for physical assistance Sit to supine: Mod assist, +2 for physical assistance, HOB elevated General bed mobility comments: Mod assist to come to sitting EOB assisting most at trunk and hips.  Mod assist to help pt position back to supine assist at bil legs and trunk.  Transfers Overall transfer level: Needs assistance Equipment used: 2 person hand held assist Transfers: Sit to/from Stand Sit to Stand: +2 safety/equipment, Mod assist General transfer comment: Pt with blocked bil feet, posterior hips and extended knees.  She had difficutly getting her feet under her COG.  Pt when attempting to step back bil knees buckled in stance Ambulation/Gait General Gait Details: unable, but we (PT/OT) think she would do well with stedy standing frame.     ADL: ADL Overall ADL's : Needs assistance/impaired General ADL Comments: continues to need total to max assist   Cognition: Cognition Overall Cognitive Status: Impaired/Different from baseline Orientation Level: Oriented X4 Cognition Arousal/Alertness: Awake/alert Behavior During Therapy: WFL for tasks assessed/performed Overall Cognitive Status: Impaired/Different from baseline Area of Impairment: Orientation, Safety/judgement, Following commands, Awareness, Problem solving, Memory, Attention Orientation Level: Disoriented to, Time, Situation, Place Current Attention Level: Sustained Memory: Decreased short-term memory Following Commands: Follows one step commands consistently Safety/Judgement: Decreased awareness of safety, Decreased  awareness of deficits Awareness: Intellectual Problem Solving: Difficulty sequencing, Slow processing, Requires verbal cues, Requires tactile cues General Comments: pt pulling at lines despite repeated cues.    Blood pressure 128/81, pulse (!) 102, temperature 98.7 F (37.1 C), temperature source Oral, resp. rate 18, height 5\' 4"  (1.626 m), weight 73.9 kg, SpO2 98 %. Physical Exam  Neurological:  Patient is alert no acute distress.  Follows commands.  Oriented to self some delay in processing for a year and date.  Follows simple commands.      General: No acute distress Mood and affect are appropriate Heart: Regular rate and rhythm no rubs murmurs or extra sounds Lungs: Clear to auscultation, breathing unlabored, no rales or wheezes Abdomen: Positive bowel sounds, soft nontender to palpation, nondistended Extremities: No clubbing, cyanosis, or edema Skin: No evidence of breakdown, no evidence of rash Neurologic: Cranial nerves II through XII intact, motor strength is 5/5 in left deltoid, bicep, tricep, grip, hip flexor, knee extensors, ankle dorsiflexor and plantar flexor 3/5 in the right deltoid bicep tricep finger flexors and extensors, 4+/5 in the right hip flexor knee extensor ankle dorsiflexor Sensory exam normal sensation to light touch  in bilateral upper and lower extremities Cerebellar exam ataxia right finger-nose-finger intact on the left side musculoskeletal: Full range of motion in all 4 extremities. No joint swelling Lab Results Last 24 Hours       Results for orders placed or performed during the hospital encounter of 08/05/19 (from the past  24 hour(s))  Glucose, capillary     Status: Abnormal    Collection Time: 08/14/19  7:40 AM  Result Value Ref Range    Glucose-Capillary 134 (H) 70 - 99 mg/dL  Glucose, capillary     Status: Abnormal    Collection Time: 08/14/19 10:59 AM  Result Value Ref Range    Glucose-Capillary 104 (H) 70 - 99 mg/dL  Glucose, capillary      Status: Abnormal    Collection Time: 08/14/19  5:31 PM  Result Value Ref Range    Glucose-Capillary 111 (H) 70 - 99 mg/dL  Glucose, capillary     Status: Abnormal    Collection Time: 08/14/19  9:33 PM  Result Value Ref Range    Glucose-Capillary 114 (H) 70 - 99 mg/dL  Glucose, capillary     Status: Abnormal    Collection Time: 08/14/19 11:45 PM  Result Value Ref Range    Glucose-Capillary 131 (H) 70 - 99 mg/dL  Glucose, capillary     Status: Abnormal    Collection Time: 08/15/19  4:44 AM  Result Value Ref Range    Glucose-Capillary 109 (H) 70 - 99 mg/dL      Imaging Results (Last 48 hours)  No results found.       Assessment/Plan: Diagnosis: Bilateral cerebral convexity infarcts frontal region with right upper extremity weakness, decreased balance and dysphagia 1. Does the need for close, 24 hr/day medical supervision in concert with the patient's rehab needs make it unreasonable for this patient to be served in a less intensive setting? Yes 2. Co-Morbidities requiring supervision/potential complications: History of substance abuse history of hypertension 3. Due to bladder management, bowel management, safety, skin/wound care, disease management, medication administration, pain management and patient education, does the patient require 24 hr/day rehab nursing? Yes 4. Does the patient require coordinated care of a physician, rehab nurse, therapy disciplines of PT, OT, speech to address physical and functional deficits in the context of the above medical diagnosis(es)? Yes Addressing deficits in the following areas: balance, endurance, locomotion, strength, transferring, bowel/bladder control, bathing, dressing, feeding, grooming, toileting, swallowing and psychosocial support 5. Can the patient actively participate in an intensive therapy program of at least 3 hrs of therapy per day at least 5 days per week? Yes 6. The potential for patient to make measurable gains while on inpatient  rehab is excellent 7. Anticipated functional outcomes upon discharge from inpatient rehab are supervision  with PT, supervision with OT, supervision with SLP. 8. Estimated rehab length of stay to reach the above functional goals is: 7 to 10 days 9. Anticipated discharge destination: Home 10. Overall Rehab/Functional Prognosis: excellent   RECOMMENDATIONS: This patient's condition is appropriate for continued rehabilitative care in the following setting: CIR Patient has agreed to participate in recommended program. Yes Note that insurance prior authorization may be required for reimbursement for recommended care.   Comment: We will need neuropsych for substance abuse counseling   Charlton Amor, PA-C 08/15/2019  "I have personally performed a face to face diagnostic evaluation of this patient.  Additionally, I have reviewed and concur with the physician assistant's documentation above."  Erick Colace M.D. [facility] FAAPM&R (Neuromuscular Med) Diplomate Am Board of Electrodiagnostic Med Fellow Am Board of Interventional Pain

## 2019-08-19 NOTE — Progress Notes (Signed)
Patient arrived to the unit in a W/C with a mask on. Patient had two belonging bags with clothes and dentures. Policies and regulations discussed with patient and patient agreed. No c/o of pain or discomfort.

## 2019-08-19 NOTE — Plan of Care (Signed)
  Problem: Consults Goal: RH STROKE PATIENT EDUCATION Description: See Patient Education module for education specifics  Outcome: Progressing Goal: Nutrition Consult-if indicated Outcome: Progressing Goal: Diabetes Guidelines if Diabetic/Glucose > 140 Description: If diabetic or lab glucose is > 140 mg/dl - Initiate Diabetes/Hyperglycemia Guidelines & Document Interventions  Outcome: Progressing   Problem: RH BOWEL ELIMINATION Goal: RH STG MANAGE BOWEL WITH ASSISTANCE Description: STG Manage Bowel with Minimal Assistance. Outcome: Progressing   Problem: RH BLADDER ELIMINATION Goal: RH STG MANAGE BLADDER WITH ASSISTANCE Description: STG Manage Bladder With Minimal Assistance Outcome: Progressing   Problem: RH SKIN INTEGRITY Goal: RH STG SKIN FREE OF INFECTION/BREAKDOWN Description: Patient will verbalize ways to prevent skin break down.  Outcome: Progressing Goal: RH STG MAINTAIN SKIN INTEGRITY WITH ASSISTANCE Description: STG Maintain Skin Integrity With Minimal Assistance. Outcome: Progressing   Problem: RH SAFETY Goal: RH STG ADHERE TO SAFETY PRECAUTIONS W/ASSISTANCE/DEVICE Description: STG Adhere to Safety Precautions With Minimal Assistance/Device. Outcome: Progressing Goal: RH STG DECREASED RISK OF FALL WITH ASSISTANCE Description: STG Decreased Risk of Fall With Minimal Assistance. Outcome: Progressing   Problem: RH KNOWLEDGE DEFICIT Goal: RH STG INCREASE KNOWLEDGE OF HYPERTENSION Description: Patient will verbalize ways to manage blood pressure.  Outcome: Progressing Goal: RH STG INCREASE KNOWLEDGE OF STROKE PROPHYLAXIS Description: Patient will verbalize signs and symptoms of a stroke by discharge.  Outcome: Progressing   

## 2019-08-20 ENCOUNTER — Inpatient Hospital Stay (HOSPITAL_COMMUNITY): Payer: Self-pay | Admitting: Occupational Therapy

## 2019-08-20 ENCOUNTER — Inpatient Hospital Stay (HOSPITAL_COMMUNITY): Payer: Self-pay | Admitting: Physical Therapy

## 2019-08-20 ENCOUNTER — Inpatient Hospital Stay (HOSPITAL_COMMUNITY): Payer: Self-pay | Admitting: Speech Pathology

## 2019-08-20 DIAGNOSIS — I10 Essential (primary) hypertension: Secondary | ICD-10-CM

## 2019-08-20 NOTE — Evaluation (Signed)
Speech Language Pathology Assessment and Plan  Patient Details  Name: Cindy [name] MRN: [id number] Date of Birth: 11-27-1960  SLP Diagnosis: Dysphagia;Cognitive Impairments  Rehab Potential: Good ELOS: 7-10 days    Today's Date: 08/20/2019 SLP Individual Time: 7829-5621 SLP Individual Time Calculation (min): 55 min   Problem List:  Patient Active Problem List   Diagnosis Date Noted  . 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 Multiple cerebral infarctions ([place]) 08/19/2019  . Cerebral embolism with cerebral infarction 08/13/2019  . Status epilepticus ([place]) 08/05/2019  . Acute encephalopathy 08/05/2019  . TRANSAMINASES, SERUM, ELEVATED 10/23/2009  . HYPERCHOLESTEROLEMIA 07/03/2008  . ALCOHOL USE 07/03/2008  . Essential hypertension 07/03/2008  . STROKE 07/03/2008  . DYSPHAGIA UNSPECIFIED 07/03/2008  . DIABETES MELLITUS, BORDERLINE 07/03/2008  . Pain in joint, shoulder region 06/22/2008  . SPONDYLOSIS 06/22/2008  . CERVICALGIA 06/22/2008  . SPINAL STENOSIS 06/22/2008  . CERVICAL SPASM 06/22/2008   Past Medical History:  Past Medical History:  Diagnosis Date  . Abnormal LFTs    Hx of  . CVA (cerebral vascular accident) ([place]) 2005  . Hyperlipidemia   . Hypertension   . Polysubstance dependence including opioid type drug, episodic abuse ([place])    cocaine/etoh    Past Surgical History:  Past Surgical History:  Procedure Laterality Date  . ESOPHAGOGASTRODUODENOSCOPY N/A 08/16/2019   Procedure: ESOPHAGOGASTRODUODENOSCOPY (EGD);  Surgeon: Clarene Essex, MD;  Location: [facility];  Service: Endoscopy;  Laterality: N/A;    Assessment / Plan / Recommendation Clinical Impression   [name] is a 59 year old right-handed female with history of polysubstance abuse, hypertension, CVA 2005, hyperlipidemia. Per chart review lives with spouse and independent prior to admission. 1 level home one-step to entry. Presented 08/05/2019 to [facility] hospital with altered mental status  and actively seizing. She was noted to have eye deviation to the right with twitching of the face. She was treated with IV Ativan and loaded with Keppra. Alcohol level 21, BNP 121, urine drug screen positive cocaine, lactic acid 2.7, urinalysis negative nitrate, sodium 124, creatinine 1.05, hemoglobin 14.9. She did require intubation for airway protection. Cranial CT scan negative for acute changes. Extensive periventricular and deep white matter hypodensity markedly increased compared to remote prior CT dated 2006. Patient did not receive TPA. She was transferred to [facility] for [facility]. Echocardiogram with ejection fraction of 30% grade 1 diastolic dysfunction. EEG negative for seizure. Carotid Dopplers negative for ICA stenosis. Follow-up MRI showed scattered small foci of restricted diffusion within the bilateral cerebral hemispheres consistent with small acute infarcts likely embolic. Maintain on aspirin and Plavix x3 weeks followed by aspirin alone for CVA prophylaxis. Subcutaneous Lovenox for DVT prophylaxis.Gastroenterology services Dr. Watt Climes consulted 08/16/2019 for Hematemesis/anemiawith hemoglobin dipping to 7.1 transfuse 1 unit packed red blood cells. Patient underwentupper GI endoscopy 08/16/2019 showing medium size hiatal hernia present. There was esophageal mucosal changes suspicious for short segment Barrett's esophagus present in the lower third of the esophagus. 1 cratered small distal esophageal ulcer with stigmata of recent bleeding found. The entire examined stomach was normal and patient was placed on Protonix twice daily.Patient's aspirin and Plavix initially on hold and only Plavix has since been resumed. Lovenox has been discontinued. Hemoglobin remained stable at 8.8.Dysphagia #1 nectar thick liquids. Therapy evaluations completed and patient was admitted for a comprehensive rehab program.  SLP evaluation was completed on 08/20/2019 with  results as follows:   Bedside Swallow Evaluation: Prior to swallow evaluation, pt's diet orders were noted to  have been changed to soft diet, thin liquids despite most recent SLP notes indicating diet recommendations being dys 1, nectar thick liquids due to penetration with thin liquids noted on MBS.  Today, pt presented with no overt s/s of aspiration with regular textured solids, purees, thin liquids, or nectar thick liquids.  In light of mild deficits on MBS, I would suggest more a more conservative and gradual diet progression to dys 3 textures and nectar thick liquids while allowing sips of thin liquid water in between meals after oral care per the water protocol for the next 1-2 days to ensure adequate toleration of advanced solids and liquids prior to full advancement back to a regular diet.  Discussed recommendations with pt, pt's husband, and nursing.    Cognitive-linguistic Evaluation: Pt presents with functionally mild cognitive deficits although her scores on the Cognistat would indicate more severe impairment.  Areas of impairment include problem solving, delayed recall, and emergent awareness of deficits.  Pt is aware of when an error has been made and has good insight into task difficulty but has difficulty correcting errors.  As a result, pt currently requires min assist for basic to mildly complex tasks.    Given the abovementioned deficits, pt would benefit from skilled ST while inpatient in order to maximize functional independence and reduce burden of care prior to discharge.  Anticipate that pt will need 24/7 supervision at least initially upon discharge in addition to ST follow up at next level of care.     Skilled Therapeutic Interventions          Cognitive-linguistic and bedside swallow evaluation completed with results and recommendations reviewed with patient and family.     SLP Assessment  Patient will need skilled Speech Lanaguage Pathology Services during CIR admission     Recommendations  SLP Diet Recommendations: Dysphagia 3 (Mech soft);Nectar;Free water protocol after oral care Liquid Administration via: Cup Medication Administration: Crushed with puree Supervision: Intermittent supervision to cue for compensatory strategies Compensations: Minimize environmental distractions;Slow rate;Small sips/bites;Clear throat intermittently Postural Changes and/or Swallow Maneuvers: Out of bed for meals;Seated upright 90 degrees;Upright 30-60 min after meal Oral Care Recommendations: Oral care BID Patient destination: Home Follow up Recommendations: Home Health SLP;24 hour supervision/assistance Equipment Recommended: To be determined    SLP Frequency 3 to 5 out of 7 days   SLP Duration  SLP Intensity  SLP Treatment/Interventions 7-10 days  Minumum of 1-2 x/day, 30 to 90 minutes  Cognitive remediation/compensation;Cueing hierarchy;Functional tasks;Patient/family education;Internal/external aids;Dysphagia/aspiration precaution training;Environmental controls    Pain Pain Assessment Pain Scale: 0-10 Pain Score: 0-No pain  Prior Functioning Cognitive/Linguistic Baseline: Within functional limits Type of Home: House  Lives With: Spouse;Family Available Help at Discharge: Family;Available 24 hours/day Vocation: Part time employment  SLP Evaluation Cognition Overall Cognitive Status: Impaired/Different from baseline Arousal/Alertness: Awake/alert Orientation Level: Oriented X4 Attention: Selective Selective Attention: Impaired Selective Attention Impairment: Functional complex Memory: Impaired Memory Impairment: Retrieval deficit Immediate Memory Recall: Sock;Blue;Bed Memory Recall Sock: With Cue Memory Recall Blue: With Cue Memory Recall Bed: With Cue Awareness: Impaired Awareness Impairment: Emergent impairment Problem Solving: Impaired Problem Solving Impairment: Functional complex Executive Function: Self Correcting Self Correcting:  Impaired Self Correcting Impairment: Functional complex Behaviors: Impulsive Safety/Judgment: Appears intact Comments: mildly impulsive  Comprehension Auditory Comprehension Overall Auditory Comprehension: Appears within functional limits for tasks assessed Expression Expression Primary Mode of Expression: Verbal Verbal Expression Overall Verbal Expression: Appears within functional limits for tasks assessed Written Expression Dominant Hand: Right Oral Motor Oral Motor/Sensory Function Overall  Oral Motor/Sensory Function: Within functional limits Motor Speech Overall Motor Speech: Appears within functional limits for tasks assessed   PMSV Assessment  PMSV Trial    Bedside Swallowing Assessment General Previous Swallow Assessment: MBS 08/12/19 Respiratory Status: Room air History of Recent Intubation: Yes Length of Intubations (days): 4 days Date extubated: 08/08/19 Behavior/Cognition: Cooperative;Pleasant mood;Alert Oral Cavity - Dentition: Edentulous Baseline Vocal Quality: Normal  Oral Care Assessment   Ice Chips   Thin Liquid Thin Liquid: Within functional limits Nectar Thick Nectar Thick Liquid: Within functional limits Honey Thick   Puree Puree: Within functional limits Solid Solid: Within functional limits BSE Assessment Risk for Aspiration Impact on safety and function: Mild aspiration risk Other Related Risk Factors: Prolonged intubation;Deconditioning  Short Term Goals: Week 1: SLP Short Term Goal 1 (Week 1): STG=LTG due to ELOS  Refer to Care Plan for Long Term Goals  Recommendations for other services: None   Discharge Criteria: Patient will be discharged from SLP if patient refuses treatment 3 consecutive times without medical reason, if treatment goals not met, if there is a change in medical status, if patient makes no progress towards goals or if patient is discharged from hospital.  The above assessment, treatment plan, treatment  alternatives and goals were discussed and mutually agreed upon: by patient and by family  Emilio Math 08/20/2019, 3:54 PM

## 2019-08-20 NOTE — Plan of Care (Signed)
  Problem: RH Balance Goal: LTG Patient will maintain dynamic standing with ADLs (OT) Description: LTG:  Patient will maintain dynamic standing balance with assist during activities of daily living (OT)  Flowsheets (Taken 08/20/2019 1550) LTG: Pt will maintain dynamic standing balance during ADLs with: Supervision/Verbal cueing   Problem: Sit to Stand Goal: LTG:  Patient will perform sit to stand in prep for activites of daily living with assistance level (OT) Description: LTG:  Patient will perform sit to stand in prep for activites of daily living with assistance level (OT) Flowsheets (Taken 08/20/2019 1550) LTG: PT will perform sit to stand in prep for activites of daily living with assistance level: Supervision/Verbal cueing   Problem: RH Grooming Goal: LTG Patient will perform grooming w/assist,cues/equip (OT) Description: LTG: Patient will perform grooming with assist, with/without cues using equipment (OT) Flowsheets (Taken 08/20/2019 1550) LTG: Pt will perform grooming with assistance level of: Set up assist    Problem: RH Bathing Goal: LTG Patient will bathe all body parts with assist levels (OT) Description: LTG: Patient will bathe all body parts with assist levels (OT) Flowsheets (Taken 08/20/2019 1550) LTG: Pt will perform bathing with assistance level/cueing: Supervision/Verbal cueing   Problem: RH Dressing Goal: LTG Patient will perform upper body dressing (OT) Description: LTG Patient will perform upper body dressing with assist, with/without cues (OT). Flowsheets (Taken 08/20/2019 1550) LTG: Pt will perform upper body dressing with assistance level of: Set up assist Goal: LTG Patient will perform lower body dressing w/assist (OT) Description: LTG: Patient will perform lower body dressing with assist, with/without cues in positioning using equipment (OT) Flowsheets (Taken 08/20/2019 1550) LTG: Pt will perform lower body dressing with assistance level of: Supervision/Verbal cueing   Problem: RH Toileting Goal: LTG Patient will perform toileting task (3/3 steps) with assistance level (OT) Description: LTG: Patient will perform toileting task (3/3 steps) with assistance level (OT)  Flowsheets (Taken 08/20/2019 1550) LTG: Pt will perform toileting task (3/3 steps) with assistance level: Supervision/Verbal cueing   Problem: RH Toilet Transfers Goal: LTG Patient will perform toilet transfers w/assist (OT) Description: LTG: Patient will perform toilet transfers with assist, with/without cues using equipment (OT) Flowsheets (Taken 08/20/2019 1550) LTG: Pt will perform toilet transfers with assistance level of: Supervision/Verbal cueing   Problem: RH Tub/Shower Transfers Goal: LTG Patient will perform tub/shower transfers w/assist (OT) Description: LTG: Patient will perform tub/shower transfers with assist, with/without cues using equipment (OT) Flowsheets (Taken 08/20/2019 1550) LTG: Pt will perform tub/shower stall transfers with assistance level of: Supervision/Verbal cueing

## 2019-08-20 NOTE — Progress Notes (Signed)
[facility] PHYSICAL MEDICINE & REHABILITATION PROGRESS NOTE   Subjective/Complaints:    Objective:   No results found. Recent Labs    08/18/19 0303 08/19/19 0535  WBC 7.3 8.4  HGB 8.8* 9.4*  HCT 25.7* 27.9*  PLT 374 448*   Recent Labs    08/18/19 0303 08/19/19 0535  NA 134* 135  K 4.0 3.3*  CL 104 101  CO2 21* 22  GLUCOSE 111* 101*  BUN 23* 23*  CREATININE 1.18* 1.14*  CALCIUM 8.8* 9.1    Intake/Output Summary (Last 24 hours) at 08/20/2019 0836 Last data filed at 08/20/2019 6283 Gross per 24 hour  Intake 360 ml  Output --  Net 360 ml     Physical Exam: Vital Signs Blood pressure (!) 125/96, pulse (!) 104, temperature 98.2 F (36.8 C), resp. rate 18, height 5\' 3"  (1.6 m), weight 55.7 kg, SpO2 100 %.  General: Alert and oriented x 3, No apparent distress HEENT: Head is normocephalic, atraumatic, PERRLA, EOMI, sclera anicteric, oral mucosa pink and moist, dentition intact, ext ear canals clear,  Neck: Supple without JVD or lymphadenopathy Heart: Reg rate and rhythm. No murmurs rubs or gallops Chest: CTA bilaterally without wheezes, rales, or rhonchi; no distress Abdomen: Soft, non-tender, non-distended, bowel sounds positive. Extremities: No clubbing, cyanosis, or edema. Pulses are 2+ Skin: Clean and intact without signs of breakdown Neuro:Alert & oriented x 3. CN exam intact. Reasonable insight and awareness. Mild right hemiparesis 4/5. Slight PD, senses pain in all 4's. No focal CN abnl. Mild dysmetria RUE. Good sitting balance Musculoskeletal: Full ROM, No pain with AROM or PROM in the neck, trunk, or extremities. Posture appropriate Psych: Pt's affect is appropriate. Pt is cooperative      Assessment/Plan: 1. Functional deficits secondary to bilateral cerebral infarcts which require 3+ hours per day of interdisciplinary therapy in a comprehensive inpatient rehab setting.  Physiatrist is providing close team supervision and 24 hour management of active  medical problems listed below.  Physiatrist and rehab team continue to assess barriers to discharge/monitor patient progress toward functional and medical goals  Care Tool:  Bathing              Bathing assist       Upper Body Dressing/Undressing Upper body dressing   What is the patient wearing?: Hospital gown only    Upper body assist      Lower Body Dressing/Undressing Lower body dressing      What is the patient wearing?: Underwear/pull up     Lower body assist       Toileting Toileting    Toileting assist Assist for toileting: Minimal Assistance - Patient > 75%     Transfers Chair/bed transfer  Transfers assist     Chair/bed transfer assist level: Minimal Assistance - Patient > 75%     Locomotion Ambulation   Ambulation assist              Walk 10 feet activity   Assist           Walk 50 feet activity   Assist           Walk 150 feet activity   Assist           Walk 10 feet on uneven surface  activity   Assist           Wheelchair     Assist               Wheelchair 50 feet  with 2 turns activity    Assist            Wheelchair 150 feet activity     Assist          Blood pressure (!) 125/96, pulse (!) 104, temperature 98.2 F (36.8 C), resp. rate 18, height 5\' 3"  (1.6 m), weight 55.7 kg, SpO2 100 %.  Medical Problem List and Plan: 1.Right side weakness with decreased balance and dysphagiasecondary to bilateral cerebral convexity infarct frontal region -patient may  shower -ELOS/Goals: 5-7d Mod I/Sup ADL and mobility  2. Antithrombotics: -DVT/anticoagulation:SCDs -antiplatelet therapy: ONLY plavix for now 3. Pain Management:Tylenol as needed 4. Mood:Provide emotional support -antipsychotic agentsSeroquel 25 mg nightly 5. Neuropsych: This patientiscapable of making decisions on herown behalf. 6. Skin/Wound  Care:Routine skin checks 7. Fluids/Electrolytes/Nutrition:Routine in and outs with follow-up chemistries 8. Dysphagia. soft/thins   -tolerating thus far.   -adv diet per SLP 9. Hypertension. Norvasc 10 mg daily, clonidine 0.1 mg twice daily.   -DBP elevated this am. Observe for pattern 10. Seizure prophylaxis. Keppra 500 mg daily 11. Hyperlipidemia. Zocor 12. History of polysubstance abuse alcohol use. Urine drug screen positive cocaine. Counseling 13. Hematemesis. Upper GI endoscopy 08/16/2019. Continue Protonix twice daily. 14. Hyperglycemia: dc CBG's    LOS: 1 days A FACE TO FACE EVALUATION WAS PERFORMED  Meredith Staggers 08/20/2019, 8:36 AM

## 2019-08-20 NOTE — Progress Notes (Signed)
Patient noted to be alert & responsive. She did sit up in bed once during the shift, but was not noted as being impulsive. No c/o pain or discomfort. She was transferred to a wheelchair to go to the restroom. She did need cueing to wait once staff was in the room to help her. No acute distress noted. Report given to oncoming nurse.

## 2019-08-20 NOTE — Evaluation (Signed)
Physical Therapy Assessment and Plan  Patient Details  Name: Cindy [name] MRN: [id number] Date of Birth: [birth year]  PT Diagnosis: Abnormal posture, Abnormality of gait, Ataxia, Ataxic gait, Coordination disorder, Hemiplegia dominant and Muscle weakness Rehab Potential: Good ELOS: 7-10 days   Today's Date: 08/20/2019 PT Individual Time: 1305-1400 PT Individual Time Calculation (min): 55 min    Problem List:  Patient Active Problem List   Diagnosis Date Noted  . 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 Multiple cerebral infarctions ([place]) 08/19/2019  . Cerebral embolism with cerebral infarction 08/13/2019  . Status epilepticus ([place]) 08/05/2019  . Acute encephalopathy 08/05/2019  . TRANSAMINASES, SERUM, ELEVATED 10/23/2009  . HYPERCHOLESTEROLEMIA 07/03/2008  . ALCOHOL USE 07/03/2008  . Essential hypertension 07/03/2008  . STROKE 07/03/2008  . DYSPHAGIA UNSPECIFIED 07/03/2008  . DIABETES MELLITUS, BORDERLINE 07/03/2008  . Pain in joint, shoulder region 06/22/2008  . SPONDYLOSIS 06/22/2008  . CERVICALGIA 06/22/2008  . SPINAL STENOSIS 06/22/2008  . CERVICAL SPASM 06/22/2008    Past Medical History:  Past Medical History:  Diagnosis Date  . Abnormal LFTs    Hx of  . CVA (cerebral vascular accident) ([place]) 2005  . Hyperlipidemia   . Hypertension   . Polysubstance dependence including opioid type drug, episodic abuse ([place])    cocaine/etoh    Past Surgical History:  Past Surgical History:  Procedure Laterality Date  . ESOPHAGOGASTRODUODENOSCOPY N/A 08/16/2019   Procedure: ESOPHAGOGASTRODUODENOSCOPY (EGD);  Surgeon: Clarene Essex, MD;  Location: [facility];  Service: Endoscopy;  Laterality: N/A;    Assessment & Plan Clinical Impression: Patient is a 59 year old right-handed female with history of polysubstance abuse, hypertension, CVA 2005, hyperlipidemia. Per chart review lives with spouse and independent prior to admission. 1 level home one-step to entry. Presented  08/05/2019 to [facility] hospital with altered mental status and actively seizing. She was noted to have eye deviation to the right with twitching of the face. She was treated with IV Ativan and loaded with Keppra. Alcohol level 21, BNP 121, urine drug screen positive cocaine, lactic acid 2.7, urinalysis negative nitrate, sodium 124, creatinine 1.05, hemoglobin 14.9. She did require intubation for airway protection. Cranial CT scan negative for acute changes. Extensive periventricular and deep white matter hypodensity markedly increased compared to remote prior CT dated 2006. Patient did not receive TPA. She was transferred to [facility] for further evaluation. Echocardiogram with ejection fraction of 30% grade 1 diastolic dysfunction. EEG negative for seizure. Carotid Dopplers negative for ICA stenosis. Follow-up MRI showed scattered small foci of restricted diffusion within the bilateral cerebral hemispheres consistent with small acute infarcts likely embolic. Maintain on aspirin and Plavix x3 weeks followed by aspirin alone for CVA prophylaxis. Subcutaneous Lovenox for DVT prophylaxis.Gastroenterology services Dr. Watt Climes consulted 08/16/2019 for Hematemesis/anemiawith hemoglobin dipping to 7.1 transfuse 1 unit packed red blood cells. Patient underwentupper GI endoscopy 08/16/2019 showing medium size hiatal hernia present. There was esophageal mucosal changes suspicious for short segment Barrett's esophagus present in the lower third of the esophagus. 1 cratered small distal esophageal ulcer with stigmata of recent bleeding found. The entire examined stomach was normal and patient was placed on Protonix twice daily. Patient transferred to [facility] on 08/19/2019 .   Patient currently requires mod with mobility secondary to muscle weakness and muscle joint tightness, decreased cardiorespiratoy endurance, impaired timing and sequencing, unbalanced muscle activation, ataxia and decreased  coordination, decreased attention, decreased problem solving, decreased safety awareness and delayed processing and decreased sitting balance, decreased standing balance, decreased postural control,  hemiplegia and decreased balance strategies.  Prior to hospitalization, patient was independent  with mobility and lived with Spouse, Family(pt reports her sister will be living with them permantly once she d/cs home) in a House home.  Home access is 3Stairs to enter.  Patient will benefit from skilled PT intervention to maximize safe functional mobility, minimize fall risk and decrease caregiver burden for planned discharge home with 24 hour supervision.  Anticipate patient will benefit from follow up [place] at discharge.  PT - End of Session Activity Tolerance: Tolerates 10 - 20 min activity with multiple rests Endurance Deficit: Yes PT Assessment Rehab Potential (ACUTE/IP ONLY): Good PT Barriers to Discharge: [place] home environment;Decreased caregiver support;Home environment access/layout;Insurance for SNF coverage;Medication compliance;Behavior PT Patient demonstrates impairments in the following area(s): Balance PT Transfers Functional Problem(s): Bed Mobility;Bed to Chair;Car;Floor;Furniture PT Locomotion Functional Problem(s): Ambulation;Wheelchair Mobility PT Plan PT Intensity: Minimum of 1-2 x/day ,45 to 90 minutes PT Frequency: 5 out of 7 days PT Duration Estimated Length of Stay: 7-10 days PT Treatment/Interventions: Ambulation/gait training;Discharge planning;Functional mobility training;Psychosocial support;Visual/perceptual remediation/compensation;Therapeutic Activities;Wheelchair propulsion/positioning;Skin care/wound management;Therapeutic Exercise;Neuromuscular re-education;Disease management/prevention;Balance/vestibular training;Cognitive remediation/compensation;Community reintegration;DME/adaptive equipment instruction;Functional electrical stimulation;Pain  management;Patient/family education;Splinting/orthotics;Stair training;UE/LE Strength taining/ROM;UE/LE Coordination activities PT Transfers Anticipated Outcome(s): Supervision assist with LRAD PT Locomotion Anticipated Outcome(s): ambualotry with LRAD and supervision assist PT Recommendation Recommendations for Other Services: Therapeutic Recreation consult Therapeutic Recreation Interventions: Stress management;Outing/community reintergration Follow Up Recommendations: Home health PT Patient destination: Home Equipment Recommended: Rolling walker with 5" wheels;To be determined  Skilled Therapeutic Intervention Pt received sitting in WC and agreeable to PT. PT instructed patient in PT Evaluation and initiated treatment intervention; see below for results. PT educated patient in [place], rehab potential, rehab goals, and discharge recommendations. Pt performed gait training without AD as listed below; additional gait training with RW x 78f and 650fwith min assist. Noted improvement in coordination and gati mechanics with RW. Ascent 1 curb step with mod assist and no UE support. Pt also performed stair negotiation x 4 steps with bil rails and min sasist ascent and mod assist descent due to RLE ataxia. PT instructed pt TUG: 87 seconds. (>13.5 sec indicates increased fall risk). Pt also performed 5xSTS 27 sec with BUE support on RW.  Car transfer with mod assist and moderate cues for sit>pivot technique from PT. Patient returned to room and performed stand pivot to recliner with min assist. Pt left sitting in recliner with call bell in reach and all needs met.       PT Evaluation Precautions/Restrictions Precautions Precautions: Fall Precaution Comments: Rt hemi, ataxia Pain Pain Assessment Pain Scale: 0-10 Pain Score: 0-No pain Home Living/Prior Functioning Home Living Available Help at Discharge: Family;Available 24 hours/day Type of Home: House Home Access: Stairs to enter [address] Steps: 3 Entrance Stairs-Rails: Right;Left;Can reach both Home Layout: One level Bathroom Shower/Tub: TuChiropodistStandard Bathroom Accessibility: Yes  Lives With: Spouse;Family(pt reports her sister will be living with them permantly once she d/cs home) Prior Function Level of Independence: Independent with basic ADLs;Independent with homemaking with ambulation  Able to Take Stairs?: Yes Driving: Yes Vocation: Part time employment 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 Vision/Perception  Perception Perception: Within FuFinancial controllerraxis: Intact  Cognition Overall Cognitive Status: Impaired/Different from baseline Arousal/Alertness: Awake/alert Orientation Level: Oriented X4 Attention: Selective Selective Attention: Impaired Selective Attention Impairment: Functional complex Memory: Impaired Memory Impairment: Retrieval deficit Immediate Memory Recall: Sock;Blue;Bed Memory Recall Sock: With Cue Memory  Recall Blue: With Cue Memory Recall Bed: With Cue Awareness: Impaired Awareness Impairment: Emergent impairment Problem Solving: Impaired Problem Solving Impairment: Functional complex Executive Function: Self Correcting Self Correcting: Impaired Self Correcting Impairment: Functional complex Behaviors: Impulsive Safety/Judgment: Impaired Comments: mildly impulsive Sensation Sensation Light Touch: Impaired Detail(Pt reports numbness/tingling in Rt UE/LE, light touch intact with gross assessment) Coordination Gross Motor Movements are Fluid and Coordinated: No Fine Motor Movements are Fluid and Coordinated: No Coordination and Movement Description: Rt hemiplegia, ataxia Finger Nose Finger Test: Ataxic, Rt>Lt UE Heel Shin Test: mild ataxia BLE R>L Motor  Motor Motor: Hemiplegia;Abnormal postural alignment and control;Ataxia;Motor apraxia Motor - Skilled Clinical Observations: mild ataxia and  dysmetria Bil. R>L  Mobility Bed Mobility Bed Mobility: Rolling Right;Rolling Left;Supine to Sit;Sit to Supine Rolling Right: Supervision/verbal cueing Rolling Left: Supervision/Verbal cueing Supine to Sit: Supervision/Verbal cueing Sit to Supine: Supervision/Verbal cueing Transfers Transfers: Sit to Stand;Stand Pivot Transfers Sit to Stand: Minimal Assistance - Patient > 75% Stand Pivot Transfers: Minimal Assistance - Patient > 75% Stand Pivot Transfer Details (indicate cue type and reason): cues for UE placement Transfer (Assistive device): None Locomotion  Gait Ambulation: Yes Gait Assistance: Minimal Assistance - Patient > 75% Gait Distance (Feet): 10 Feet Assistive device: None;1 person hand held assist Gait Gait: Yes Gait Pattern: Impaired Gait Pattern: Wide base of support;Scissoring;Ataxic Stairs / Additional Locomotion Stairs: Yes Stairs Assistance: Moderate Assistance - Patient 50 - 74% Stair Management Technique: No rails Number of Stairs: 1 Height of Stairs: 8 Wheelchair Mobility Wheelchair Mobility: Yes Wheelchair Assistance: Chartered loss adjuster: Both upper extremities Wheelchair Parts Management: Needs assistance Distance: 150  Trunk/Postural Assessment  Cervical Assessment Cervical Assessment: Exceptions to WFL(forward head) Thoracic Assessment Thoracic Assessment: Exceptions to WFL(rounded shoulders) Lumbar Assessment Lumbar Assessment: Within Functional Limits Postural Control Postural Control: Deficits on evaluation(impaired during functional ambulation without device)  Balance Balance Balance Assessed: Yes Standardized Balance Assessment Standardized Balance Assessment: Timed Up and Go Test Timed Up and Go Test TUG: Normal TUG(with RW) Normal TUG (seconds): 87 Dynamic Sitting Balance Dynamic Sitting - Level of Assistance: 5: Stand by assistance Static Standing Balance Static Standing - Level of Assistance: 4: Min  assist Dynamic Standing Balance Dynamic Standing - Balance Support: No upper extremity supported;During functional activity Dynamic Standing - Level of Assistance: 3: Mod assist Extremity Assessment  RUE Assessment RUE Assessment: Exceptions to WFL(dominant level at baseline, used at nondominant level during eval) Active Range of Motion (AROM) Comments: WNL General Strength Comments: 3+/5 grossly, ataxic LUE Assessment LUE Assessment: Exceptions to [facility] Active Range of Motion (AROM) Comments: WNL General Strength Comments: 4/5 grossly RLE Assessment RLE Assessment: Within Functional Limits General Strength Comments: grossly 4+/5 to 5/5 with delayed activaiton LLE Assessment LLE Assessment: Within Functional Limits General Strength Comments: grossly 4+/5 to 5/5 proximal to distal    Refer to Care Plan for Long Term Goals  Recommendations for other services: Therapeutic Recreation  Kitchen group, Stress management and Outing/community reintegration  Discharge Criteria: Patient will be discharged from PT if patient refuses treatment 3 consecutive times without medical reason, if treatment goals not met, if there is a change in medical status, if patient makes no progress towards goals or if patient is discharged from hospital.  The above assessment, treatment plan, treatment alternatives and goals were discussed and mutually agreed upon: by patient  Lorie Phenix 08/20/2019, 2:00 PM

## 2019-08-20 NOTE — Progress Notes (Signed)
Alerted by therapy to mass on pt's R abdomen. About golf ball size under skin. Palpated and causes pt no pain.

## 2019-08-20 NOTE — Evaluation (Signed)
Occupational Therapy Assessment and Plan  Patient Details  Name: Cindy [name] MRN: [id number] Date of Birth: 1960/07/19  OT Diagnosis: abnormal posture, apraxia, ataxia, cognitive deficits, hemiplegia affecting dominant side and muscle weakness (generalized) Rehab Potential: Rehab Potential (ACUTE ONLY): Excellent ELOS: 10-12 days  Today's Date: 08/20/2019 OT Individual Time: 4742-5956 OT Individual Time Calculation (min): 71 min     Problem List:  Patient Active Problem List   Diagnosis Date Noted  . 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 Multiple cerebral infarctions ([place]) 08/19/2019  . Cerebral embolism with cerebral infarction 08/13/2019  . Status epilepticus ([place]) 08/05/2019  . Acute encephalopathy 08/05/2019  . TRANSAMINASES, SERUM, ELEVATED 10/23/2009  . HYPERCHOLESTEROLEMIA 07/03/2008  . ALCOHOL USE 07/03/2008  . Essential hypertension 07/03/2008  . STROKE 07/03/2008  . DYSPHAGIA UNSPECIFIED 07/03/2008  . DIABETES MELLITUS, BORDERLINE 07/03/2008  . Pain in joint, shoulder region 06/22/2008  . SPONDYLOSIS 06/22/2008  . CERVICALGIA 06/22/2008  . SPINAL STENOSIS 06/22/2008  . CERVICAL SPASM 06/22/2008    Past Medical History:  Past Medical History:  Diagnosis Date  . Abnormal LFTs    Hx of  . CVA (cerebral vascular accident) ([place]) 2005  . Hyperlipidemia   . Hypertension   . Polysubstance dependence including opioid type drug, episodic abuse ([place])    cocaine/etoh    Past Surgical History:  Past Surgical History:  Procedure Laterality Date  . ESOPHAGOGASTRODUODENOSCOPY N/A 08/16/2019   Procedure: ESOPHAGOGASTRODUODENOSCOPY (EGD);  Surgeon: Clarene Essex, MD;  Location: [facility];  Service: Endoscopy;  Laterality: N/A;    Assessment & Plan Clinical Impression:   Breean Nannini. Ortner is a 59 year old right-handed female with history of polysubstance abuse, hypertension, CVA 2005, hyperlipidemia. Per chart review lives with spouse and independent prior to admission.  1 level home one-step to entry. Presented 08/05/2019 to [facility] with altered mental status and actively seizing. She was noted to have eye deviation to the right with twitching of the face. She was treated with IV Ativan and loaded with Keppra. Alcohol level 21, BNP 121, urine drug screen positive cocaine, lactic acid 2.7, urinalysis negative nitrate, sodium 124, creatinine 1.05, hemoglobin 14.9. She did require intubation for airway protection. Cranial CT scan negative for acute changes. Extensive periventricular and deep white matter hypodensity markedly increased compared to remote prior CT dated 2006. Patient did not receive TPA. She was [facility] for [facility] evaluation. Echocardiogram with ejection fraction of 38% grade 1 diastolic dysfunction. EEG negative for seizure. Carotid Dopplers negative for ICA stenosis. Follow-up MRI showed scattered small foci of restricted diffusion within the bilateral cerebral hemispheres consistent with small acute infarcts likely embolic. Maintain on aspirin and Plavix x3 weeks followed by aspirin alone for CVA prophylaxis. Subcutaneous Lovenox for DVT prophylaxis.Gastroenterology services Dr. Watt Climes consulted 08/16/2019 for Hematemesis/anemiawith hemoglobin dipping to 7.1 transfuse 1 unit packed red blood cells. Patient underwentupper GI endoscopy 08/16/2019 showing medium size hiatal hernia present. There was esophageal mucosal changes suspicious for short segment Barrett's esophagus present in the lower third of the esophagus. 1 cratered small distal esophageal ulcer with stigmata of recent bleeding found. The entire examined stomach was normal and patient was placed on Protonix twice daily.Patient's aspirin and Plavix initially on hold and only Plavix has since been resumed. Lovenox has been discontinued. Hemoglobin remained stable at 8.8.Dysphagia #1 nectar thick liquids. Therapy evaluations completed and  patient was admitted for a comprehensive rehab program.  Patient currently requires min with basic self-care skills secondary to muscle weakness,  decreased cardiorespiratoy endurance, motor apraxia, ataxia and decreased coordination, decreased safety awareness and decreased memory and decreased sitting balance, decreased standing balance, decreased postural control and hemiplegia.  Prior to hospitalization, patient could complete BADLs with independent .  Patient will benefit from skilled intervention to increase independence with basic self-care skills prior to discharge home with family.  Anticipate patient will require 24 hour supervision and follow up home health.  OT - End of Session Endurance Deficit: Yes OT Assessment Rehab Potential (ACUTE ONLY): Excellent OT Barriers to Discharge: Medical stability OT Patient demonstrates impairments in the following area(s): Balance;Safety;Cognition;Sensory;Endurance;Motor OT Basic ADL's Functional Problem(s): Grooming;Bathing;Dressing;Toileting OT Advanced ADL's Functional Problem(s): Simple Meal Preparation;Laundry OT Transfers Functional Problem(s): Tub/Shower;Toilet OT Additional Impairment(s): None OT Plan OT Intensity: Minimum of 1-2 x/day, 45 to 90 minutes OT Frequency: 5 out of 7 days OT Duration/Estimated Length of Stay: 10-12 days OT Treatment/Interventions: Balance/vestibular training;Discharge planning;Pain management;Self Care/advanced ADL retraining;Therapeutic Activities;UE/LE Coordination activities;Cognitive remediation/compensation;Disease mangement/prevention;Functional mobility training;Patient/family education;Therapeutic Exercise;DME/adaptive equipment instruction;Neuromuscular re-education;Community reintegration;Psychosocial support;UE/LE Strength taining/ROM OT Self Feeding Anticipated Outcome(s): No goal OT Basic Self-Care Anticipated Outcome(s): Supervision/cuing OT Toileting Anticipated Outcome(s): Supervision/cuing OT  Bathroom Transfers Anticipated Outcome(s): Supervision/cuing OT Recommendation Recommendations for Other Services: Neuropsych consult Patient destination: Home Follow Up Recommendations: 24 hour supervision/assistance Equipment Recommended: To be determined;Tub/shower bench  Skilled Therapeutic Intervention Skilled OT session completed with focus on initial evaluation, education on OT role/POC, and establishment of patient-centered goals.   Pt greeted in bed with no c/o pain, agreeable to tx and eagerly transitioning to EOB, needing min vcs for impulsivity throughout tx. She declined shower, stating "I want to get my strength back first." Bathing/dressing tasks completed EOB at sit<stand level without device. CGA for dynamic standing balance during pericare. Pt needed vcs for thoroughness due to residue on wash cloths and terminating washing before cloths were clean. She also needed questioning cues for recognizing she put on her shirt backwards. Note 1 posterior LOB when attempting to don Teds, pt needing assist to don compression socks but able to don gripper socks with good LE flexibility noted bilaterally. Also noted ataxic UE movement Rt>Lt. She ambulated to a chair placed outside of bathroom without device and Mod A, noted flexed trunk and Lt and Rt swaying with lateral LOBs. Pt c/o numbness/tingling in the Rt leg from the knee down. Once she was given the RW, pt ambulated with Min A into the bathroom to complete simulated toilet and TTB transfers. Noted heavy UE reliance on device, manual cues for upright trunk and forward gaze. Pt tended to watch her feet during ambulation and had a slow, concentrative pace. Denture care completed while standing at the sink, pt stating "Isn't that ugly?" when noticing ataxic movements of Rt hand when she was brushing dentures using her brush. Pt also c/o tingling in the Rt forearm. After a few minutes of standing pt requested to sit down because her back was  starting to bother her. This subsided once she sat down in the recliner. Left her in the recliner with all needs within reach and chair alarm activated. Discussed need for staff assist for all OOB transfer needs with pt verbalizing understanding.   Also notified RN of a hard lumpy area on the Rt side of her abdomen. Per pt, this is new.   OT Evaluation Precautions/Restrictions  Precautions Precautions: Fall Precaution Comments: Rt hemi, ataxia Restrictions Weight Bearing Restrictions: No Pain Pain Assessment Pain Scale: 0-10 Pain Score: 0-No pain Home Living/Prior Functioning Home Living Family/patient expects   to be discharged to:: Private residence Living Arrangements: Spouse/significant other Available Help at Discharge: Family, Available 24 hours/day Type of Home: House Home Access: Stairs to enter Entrance Stairs-Number of Steps: 1 Home Layout: One level Bathroom Shower/Tub: Tub/shower unit Bathroom Toilet: Standard Bathroom Accessibility: Yes  Lives With: Spouse, Family(pt reports her sister will be living with them permantly once she d/cs home) IADL History Homemaking Responsibilities: Yes(pt reported being independent with IADLs PTA) Type of Occupation: Cleans houses Leisure and Hobbies: Going for walks Prior Function Level of Independence: Independent with basic ADLs, Independent with homemaking with ambulation Driving: Yes ADL ADL Eating: Set up Grooming: Contact guard Where Assessed-Grooming: Standing at sink Upper Body Bathing: Setup Where Assessed-Upper Body Bathing: Edge of bed Lower Body Bathing: Contact guard Where Assessed-Lower Body Bathing: Edge of bed Upper Body Dressing: Supervision/safety Where Assessed-Upper Body Dressing: Edge of bed Lower Body Dressing: Minimal assistance Where Assessed-Lower Body Dressing: Edge of bed Toileting: Not assessed Toilet Transfer: Minimal assistance Toilet Transfer Method: Ambulating(with RW) Walk-In Shower  Transfer: Minimal assistance Walk-In Shower Transfer Method: Ambulating(RW) Walk-In Shower Equipment: Transfer tub bench Vision Baseline Vision/History: Wears glasses Wears Glasses: At all times Patient Visual Report: No change from baseline Perception  Perception: Within Functional Limits Praxis Praxis: Intact Cognition Overall Cognitive Status: Impaired/Different from baseline Arousal/Alertness: Awake/alert Orientation Level: Person;Place;Situation Person: Oriented Place: Oriented Situation: Oriented Year: 2021 Month: April Day of Week: Correct Memory: Impaired Memory Impairment: Retrieval deficit Immediate Memory Recall: Sock;Blue;Bed Memory Recall Sock: With Cue Memory Recall Blue: With Cue Memory Recall Bed: With Cue Awareness: Impaired Problem Solving: Impaired Behaviors: Impulsive Safety/Judgment: Appears intact Sensation Sensation Light Touch: Impaired Detail(Pt reports numbness/tingling in Rt UE/LE, light touch intact with gross assessment) Coordination Gross Motor Movements are Fluid and Coordinated: No Fine Motor Movements are Fluid and Coordinated: No Coordination and Movement Description: Rt hemiplegia, ataxia Finger Nose Finger Test: Ataxic, Rt>Lt UE Motor  Motor Motor: Hemiplegia;Abnormal postural alignment and control;Ataxia, Motor apraxia  Mobility    Min A ambulatory transfers using RW to the toilet and TTB Trunk/Postural Assessment  Cervical Assessment Cervical Assessment: Exceptions to WFL(forward head) Thoracic Assessment Thoracic Assessment: Exceptions to WFL(rounded shoulders) Lumbar Assessment Lumbar Assessment: Within Functional Limits Postural Control Postural Control: Deficits on evaluation(impaired during functional ambulation without device)  Balance Balance Balance Assessed: Yes Dynamic Sitting Balance Dynamic Sitting - Level of Assistance: 5: Stand by assistance(attempting to don Ted stockings, 1 posterior LOB onto the  bed) Dynamic Standing Balance Dynamic Standing - Level of Assistance: 4: Min assist(toilet transfer using RW at ambulatory level) Extremity/Trunk Assessment RUE Assessment RUE Assessment: Exceptions to WFL(dominant level at baseline, used at nondominant level during eval) Active Range of Motion (AROM) Comments: WNL General Strength Comments: 3+/5 grossly, ataxic LUE Assessment LUE Assessment: Exceptions to WFL Active Range of Motion (AROM) Comments: WNL General Strength Comments: 4/5 grossly     Refer to Care Plan for Long Term Goals  Recommendations for other services: Neuropsych   Discharge Criteria: Patient will be discharged from OT if patient refuses treatment 3 consecutive times without medical reason, if treatment goals not met, if there is a change in medical status, if patient makes no progress towards goals or if patient is discharged from hospital.  The above assessment, treatment plan, treatment alternatives and goals were discussed and mutually agreed upon: by patient   A  08/20/2019, 12:35 PM  

## 2019-08-21 NOTE — Progress Notes (Signed)
[place] PHYSICAL MEDICINE & REHABILITATION PROGRESS NOTE   Subjective/Complaints: Had a reasonable night. No new problems today. Slept well  ROS: Patient denies fever, rash, sore throat, blurred vision, nausea, vomiting, diarrhea, cough, shortness of breath or chest pain, joint or back pain, headache, or mood change.    Objective:   No results found. Recent Labs    08/19/19 0535  WBC 8.4  HGB 9.4*  HCT 27.9*  PLT 448*   Recent Labs    08/19/19 0535  NA 135  K 3.3*  CL 101  CO2 22  GLUCOSE 101*  BUN 23*  CREATININE 1.14*  CALCIUM 9.1    Intake/Output Summary (Last 24 hours) at 08/21/2019 0747 Last data filed at 08/20/2019 1823 Gross per 24 hour  Intake 720 ml  Output --  Net 720 ml     Physical Exam: Vital Signs Blood pressure 130/80, pulse 94, temperature 98.6 F (37 C), temperature source Oral, resp. rate 16, height 5\' 3"  (1.6 m), weight 57.4 kg, SpO2 100 %.  Constitutional: No distress . Vital signs reviewed. HEENT: EOMI, oral membranes moist Neck: supple Cardiovascular: RRR without murmur. No JVD    Respiratory/Chest: CTA Bilaterally without wheezes or rales. Normal effort    GI/Abdomen: BS +, non-tender, non-distended Ext: no clubbing, cyanosis, or edema Psych: pleasant and cooperative Skin: Clean and intact without signs of breakdown Neuro:Alert & oriented x 3. CN exam intact. Reasonable insight and awareness. Mild right hemiparesis 4/5. Slight PD, senses pain in all 4's. No focal CN abnl. Mild dysmetria RUE. Good sitting balance Musculoskeletal: Full ROM, No pain with AROM or PROM in the neck, trunk, or extremities. Posture appropriate Psych: Pt's affect is appropriate. Pt is cooperative      Assessment/Plan: 1. Functional deficits secondary to bilateral cerebral infarcts which require 3+ hours per day of interdisciplinary therapy in a comprehensive inpatient rehab setting.  Physiatrist is providing close team supervision and 24 hour management  of active medical problems listed below.  Physiatrist and rehab team continue to assess barriers to discharge/monitor patient progress toward functional and medical goals  Care Tool:  Bathing    Body parts bathed by patient: Right arm, Left arm, Chest, Abdomen, Front perineal area, Buttocks, Right upper leg, Left upper leg, Right lower leg, Left lower leg, Face         Bathing assist Assist Level: Contact Guard/Touching assist     Upper Body Dressing/Undressing Upper body dressing   What is the patient wearing?: Pull over shirt    Upper body assist Assist Level: Supervision/Verbal cueing    Lower Body Dressing/Undressing Lower body dressing      What is the patient wearing?: Underwear/pull up, Pants     Lower body assist Assist for lower body dressing: Contact Guard/Touching assist     Toileting Toileting Toileting Activity did not occur (Clothing management and hygiene only): N/A (no void or bm)  Toileting assist Assist for toileting: Contact Guard/Touching assist     Transfers Chair/bed transfer  Transfers assist     Chair/bed transfer assist level: Contact Guard/Touching assist     Locomotion Ambulation   Ambulation assist      Assist level: Moderate Assistance - Patient 50 - 74% Assistive device: No Device Max distance: 10   Walk 10 feet activity   Assist     Assist level: Moderate Assistance - Patient - 50 - 74% Assistive device: Hand held assist   Walk 50 feet activity   Assist Walk 50 feet with  2 turns activity did not occur: Safety/medical concerns         Walk 150 feet activity   Assist Walk 150 feet activity did not occur: Safety/medical concerns         Walk 10 feet on uneven surface  activity   Assist Walk 10 feet on uneven surfaces activity did not occur: Safety/medical concerns         Wheelchair     Assist   Type of Wheelchair: Manual    Wheelchair assist level: Supervision/Verbal cueing Max  wheelchair distance: 150    Wheelchair 50 feet with 2 turns activity    Assist        Assist Level: Supervision/Verbal cueing   Wheelchair 150 feet activity     Assist      Assist Level: Supervision/Verbal cueing   Blood pressure 130/80, pulse 94, temperature 98.6 F (37 C), temperature source Oral, resp. rate 16, height 5\' 3"  (1.6 m), weight 57.4 kg, SpO2 100 %.  Medical Problem List and Plan: 1.Right side weakness with decreased balance and dysphagiasecondary to bilateral cerebral convexity infarct frontal region -patient may  shower -ELOS/Goals: 5-7d Mod I/Sup ADL and mobility   -Continue CIR therapies including PT, OT, and SLP  2. Antithrombotics: -DVT/anticoagulation:SCDs -antiplatelet therapy: ONLY plavix for now 3. Pain Management:Tylenol as needed 4. Mood:Provide emotional support -antipsychotic agentsSeroquel 25 mg nightly 5. Neuropsych: This patientiscapable of making decisions on herown behalf. 6. Skin/Wound Care:Routine skin checks 7. Fluids/Electrolytes/Nutrition:Routine in and outs with follow-up chemistries 8. Dysphagia. soft/thins   -tolerating thus far.   -adv diet per SLP 9. Hypertension. Norvasc 10 mg daily, clonidine 0.1 mg twice daily.   -intermittently elevated. (decreased this morning 4/4)  -observe only for now 10. Seizure prophylaxis. Keppra 500 mg daily 11. Hyperlipidemia. Zocor 12. History of polysubstance abuse alcohol use. Urine drug screen positive cocaine. Counseling 13. Hematemesis. Upper GI endoscopy 08/16/2019. Continue Protonix twice daily. 14. Hyperglycemia: dc CBG's    LOS: 2 days A FACE TO FACE EVALUATION WAS PERFORMED  08/18/2019 08/21/2019, 7:47 AM

## 2019-08-21 NOTE — Care Management (Signed)
[facility]patient Rehabilitation [facility] Individual Statement of Services  Patient Name:  Cindy [name]  Date:  08/21/2019  Welcome to the Inpatient Rehabilitation Center.  Our goal is to provide you with an individualized program based on your diagnosis and situation, designed to meet your specific needs.  With this comprehensive rehabilitation program, you will be expected to participate in at least 3 hours of rehabilitation therapies Monday-Friday, with modified therapy programming on the weekends.  Your rehabilitation program will include the following services:  Physical Therapy (PT), Occupational Therapy (OT), Speech Therapy (ST), 24 hour per day rehabilitation nursing, Therapeutic Recreaction (TR), Neuropsychology, Case Management (Social Worker), Rehabilitation Medicine, Nutrition Services and Pharmacy Services  Weekly team conferences will be held on Wednesdays to discuss your progress.  Your Social Automotive engineer will talk with you frequently to get your input and to update you on team discussions.  Team conferences with you and your family in attendance may also be held.  Expected length of stay: 7-10 days  Overall anticipated outcome: Supervision  Depending on your progress and recovery, your program may change. Your Social Automotive engineer will coordinate services and will keep you informed of any changes. Your Social Worker's/Care Manager's name and contact numbers are listed  below.  The following services may also be recommended but are not provided by the Inpatient Rehabilitation Center:    Home Health Rehabiltiation Services  Outpatient Rehabilitation Services   Arrangements will be made to provide these services after discharge if needed.  Arrangements include referral to agencies that provide these services.  Your insurance has been verified to be:  Uninsured Your primary doctor is:  [place]on Gully, MD  Pertinent information will be shared with your doctor and your  insurance company.  Care Manager/Social Worker: Chana Bode, RN  [phone] or [phone]  Information discussed with and copy given to patient by: Pamelia Hoit, 08/21/2019, 9:56 AM

## 2019-08-22 ENCOUNTER — Inpatient Hospital Stay (HOSPITAL_COMMUNITY): Payer: Self-pay | Admitting: Occupational Therapy

## 2019-08-22 ENCOUNTER — Inpatient Hospital Stay (HOSPITAL_COMMUNITY): Payer: Self-pay | Admitting: Speech Pathology

## 2019-08-22 ENCOUNTER — Inpatient Hospital Stay (HOSPITAL_COMMUNITY): Payer: Self-pay | Admitting: Physical Therapy

## 2019-08-22 DIAGNOSIS — I639 Cerebral infarction, unspecified: Secondary | ICD-10-CM

## 2019-08-22 LAB — CBC WITH DIFFERENTIAL/PLATELET
Abs Immature Granulocytes: 0.04 10*3/uL (ref 0.00–0.07)
Basophils Absolute: 0.1 10*3/uL (ref 0.0–0.1)
Basophils Relative: 2 %
Eosinophils Absolute: 0.2 10*3/uL (ref 0.0–0.5)
Eosinophils Relative: 3 %
HCT: 28.5 % — ABNORMAL LOW (ref 36.0–46.0)
Hemoglobin: 9.4 g/dL — ABNORMAL LOW (ref 12.0–15.0)
Immature Granulocytes: 1 %
Lymphocytes Relative: 24 %
Lymphs Abs: 1.2 10*3/uL (ref 0.7–4.0)
MCH: 32 pg (ref 26.0–34.0)
MCHC: 33 g/dL (ref 30.0–36.0)
MCV: 96.9 fL (ref 80.0–100.0)
Monocytes Absolute: 0.8 10*3/uL (ref 0.1–1.0)
Monocytes Relative: 15 %
Neutro Abs: 2.9 10*3/uL (ref 1.7–7.7)
Neutrophils Relative %: 55 %
Platelets: 609 10*3/uL — ABNORMAL HIGH (ref 150–400)
RBC: 2.94 MIL/uL — ABNORMAL LOW (ref 3.87–5.11)
RDW: 12.6 % (ref 11.5–15.5)
WBC: 5.1 10*3/uL (ref 4.0–10.5)
nRBC: 0 % (ref 0.0–0.2)

## 2019-08-22 LAB — COMPREHENSIVE METABOLIC PANEL
ALT: 16 U/L (ref 0–44)
AST: 24 U/L (ref 15–41)
Albumin: 2.9 g/dL — ABNORMAL LOW (ref 3.5–5.0)
Alkaline Phosphatase: 48 U/L (ref 38–126)
Anion gap: 9 (ref 5–15)
BUN: 12 mg/dL (ref 6–20)
CO2: 24 mmol/L (ref 22–32)
Calcium: 9.1 mg/dL (ref 8.9–10.3)
Chloride: 106 mmol/L (ref 98–111)
Creatinine, Ser: 1.18 mg/dL — ABNORMAL HIGH (ref 0.44–1.00)
GFR calc Af Amer: 58 mL/min — ABNORMAL LOW (ref >60)
GFR calc non Af Amer: 50 mL/min — ABNORMAL LOW (ref >60)
Glucose, Bld: 100 mg/dL — ABNORMAL HIGH (ref 70–99)
Potassium: 3.5 mmol/L (ref 3.5–5.1)
Sodium: 139 mmol/L (ref 135–145)
Total Bilirubin: 0.3 mg/dL (ref 0.3–1.2)
Total Protein: 6.8 g/dL (ref 6.5–8.1)

## 2019-08-22 MED ORDER — ADULT MULTIVITAMIN W/MINERALS CH
1.0000 | ORAL_TABLET | Freq: Every day | ORAL | Status: DC
  Administered 2019-08-22 – 2019-08-27 (×6): 1 via ORAL
  Filled 2019-08-22 (×6): qty 1

## 2019-08-22 NOTE — Progress Notes (Signed)
Patient information reviewed and entered into [facility] System by Becky Jera Headings, PPS coordinator. Information including medical coding, function ability, and quality indicators will be reviewed and updated through discharge.   

## 2019-08-22 NOTE — IPOC Note (Signed)
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 Hospital Problems: Active Problems:   Ataxia due to recent stroke   Multiple cerebral infarctions ([place])     Functional Problem List: Nursing Behavior, Sensory, Endurance, Medication Management, Motor, Nutrition, Pain, Perception, Safety  PT Balance  OT Balance, Safety, Cognition, Sensory, Endurance, Motor  SLP Cognition, Nutrition  TR         Basic ADL's: OT Grooming, Bathing, Dressing, Toileting     Advanced  ADL's: OT Simple Meal Preparation, Laundry     Transfers: PT Bed Mobility, Bed to Chair, Car, Floor, Patent attorney, Agricultural engineer: PT Ambulation, Wheelchair Mobility     Additional Impairments: OT None  SLP Swallowing, Social Cognition   Memory, Problem Solving, Attention, Awareness  TR      Anticipated Outcomes Item Anticipated Outcome  Self Feeding No goal  Swallowing  mod I   Basic self-care  Supervision/cuing  Toileting  Supervision/cuing   Bathroom Transfers Supervision/cuing  Bowel/Bladder  Patient will manage bowel/bladder independently.  Transfers  Supervision assist with LRAD  Locomotion  ambualotry with LRAD and supervision assist  Communication     Cognition  supervision  Pain  Patient will verbalize ways to manage pain while in the hospital.  Safety/Judgment  Patient will verbalize ways to prevent injury/falls while in the hospital.   Therapy Plan: PT Intensity: Minimum of 1-2 x/day ,45 to 90 minutes PT Frequency: 5 out of 7 days PT Duration Estimated Length of Stay: 7-10 days OT Intensity: Minimum of 1-2 x/day, 45 to 90 minutes OT Frequency: 5 out of 7 days OT Duration/Estimated Length of Stay: 10-12 days SLP Intensity: Minumum of 1-2 x/day, 30 to 90 minutes SLP Frequency: 3 to 5 out of 7 days SLP Duration/Estimated Length of Stay: 7-10 days   Due  to the current state of emergency, patients may not be receiving their 3-hours of Medicare-mandated therapy.   Team Interventions: Nursing Interventions Patient/Family Education, Medication Management, Psychosocial Support, Skin Care/Wound Management, Cognitive Remediation/Compensation, Disease Management/Prevention, Dysphagia/Aspiration Precaution Training, Pain Management, Discharge Planning  PT interventions Ambulation/gait training, Discharge planning, Functional mobility training, Psychosocial support, Visual/perceptual remediation/compensation, Therapeutic Activities, Wheelchair propulsion/positioning, Skin care/wound management, Therapeutic Exercise, Neuromuscular re-education, Disease management/prevention, Training and development officer, Cognitive remediation/compensation, Community reintegration, Engineer, drilling, Functional electrical stimulation, Pain management, Patient/family education, Splinting/orthotics, Stair training, UE/LE Strength taining/ROM, UE/LE Coordination activities  OT Interventions Balance/vestibular training, Discharge planning, Pain management, Self Care/advanced ADL retraining, Therapeutic Activities, UE/LE Coordination activities, Cognitive remediation/compensation, Disease mangement/prevention, Functional mobility training, Patient/family education, Therapeutic Exercise, DME/adaptive equipment instruction, Neuromuscular re-education, Community reintegration, Psychosocial support, UE/LE Strength taining/ROM  SLP Interventions Cognitive remediation/compensation, English as a second language teacher, Functional tasks, Patient/family education, Internal/external aids, Dysphagia/aspiration precaution training, Environmental controls  TR Interventions    SW/CM Interventions Discharge Planning, Patient/Family Education, Psychosocial Support, Disease Management/Prevention   Barriers to Discharge MD  Medical stability  Nursing      PT Inaccessible home environment, Decreased  caregiver support, Home environment Child psychotherapist, Insurance for SNF coverage, Medication compliance, Behavior    OT Medical stability    SLP      SW Decreased caregiver support, New diabetic, Medication compliance     Team Discharge Planning: Destination: PT-Home ,OT- Home , SLP-Home Projected Follow-up: PT-Home health PT, OT-  24 hour supervision/assistance, SLP-Home Health SLP, 24 hour supervision/assistance Projected Equipment Needs: PT-Rolling walker with 5" wheels, To be determined, OT- To be determined,  Tub/shower bench, SLP-To be determined Equipment Details: PT- , OT-  Patient/family involved in discharge planning: PT- Patient,  OT-Patient, SLP-Patient, Family member/caregiver  MD ELOS: 7d Medical Rehab Prognosis:  Excellent Assessment:   59 year old right-handed female with history of polysubstance abuse, hypertension, CVA 2005, hyperlipidemia. Per chart review lives with spouse and independent prior to admission. 1 level home one-step to entry. Presented 08/05/2019 to [facility] hospital with altered mental status and actively seizing. She was noted to have eye deviation to the right with twitching of the face. She was treated with IV Ativan and loaded with Keppra. Alcohol level 21, BNP 121, urine drug screen positive cocaine, lactic acid 2.7, urinalysis negative nitrate, sodium 124, creatinine 1.05, hemoglobin 14.9. She did require intubation for airway protection. Cranial CT scan negative for acute changes. Extensive periventricular and deep white matter hypodensity markedly increased compared to remote prior CT dated 2006. Patient did not receive TPA. She was transferred to [facility] for further evaluation. Echocardiogram with ejection fraction of 60% grade 1 diastolic dysfunction. EEG negative for seizure. Carotid Dopplers negative for ICA stenosis. Follow-up MRI showed scattered small foci of restricted diffusion within the bilateral cerebral hemispheres  consistent with small acute infarcts likely embolic. Maintain on aspirin and Plavix x3 weeks followed by aspirin alone for CVA prophylaxis. Subcutaneous Lovenox for DVT prophylaxis.Gastroenterology services Dr. Ewing Schlein consulted 08/16/2019 for Hematemesis/anemiawith hemoglobin dipping to 7.1 transfuse 1 unit packed red blood cells. Patient underwentupper GI endoscopy 08/16/2019 showing medium size hiatal hernia present. There was esophageal mucosal changes suspicious for short segment Barrett's esophagus present in the lower third of the esophagus. 1 cratered small distal esophageal ulcer with stigmata of recent bleeding found. The entire examined stomach was normal and patient was placed on Protonix twice daily.Patient's aspirin and Plavix initially on hold and only Plavix has since been resumed. Lovenox has been discontinued. Hemoglobin remained stable at 8.8.Dysphagia #1 nectar thick liquids. Therapy evaluations completed and patient was admitted for a comprehensive rehab program.  See Team Conference Notes for weekly updates to the plan of care   Now requiring 24/7 Rehab RN,MD, as well as CIR level PT, OT , SLP.  Treatment team will focus on ADLs and mobility with goals set at

## 2019-08-22 NOTE — Progress Notes (Signed)
Physical Therapy Session Note  Patient Details  Name: Cindy [name] MRN: [id number] Date of Birth: 08-10-1960  Today's Date: 08/22/2019 PT Individual Time: 1100-1200 PT Individual Time Calculation (min): 60 min   Short Term Goals: Week 1:  PT Short Term Goal 1 (Week 1): STG=LTG due tp ELOS  Skilled Therapeutic Interventions/Progress Updates: Pt presented in bed agreeable to therapy. Pt denies pain at start of session. Performed bed mobility to EOB with supervision and use of bed features. PTA assisted with doffing gown and pt donned scrub top and pants with set up. Performed STS no AD CGA to complete pulling pants over hips. Performed ambulatory transfer to w/c minA. Pt transported to rehab gym for energy conservation and performed stand pivot transfer to mat with RW and CGA with verbal cues for hand placement. Participated in toe taps to target on 4in step 2 x10 for wt shifting and coordination with CGA. Pt noted to have x 1 LOB posteriorly requiring minA from PTA for correction. Participated in 2 bouts of horseshoes on level tile with and without AD with moderate reaching challenges in all planes and no LOB. Participated in 3rd round of horseshoes while standing on Aire[employer] with RW. Pt with poor ankle strategy and intermittent posterior LOB however pt was able to improve with verbal cues. Participated in gait training 12f x 3. Pt able to demonstrate fair control of foot placement however decreased knee flexion and heel strike noted. PTA placed 1lb wts on pt during second bout for increased biofeedback with some fair carryover noted on third bout. Pt transported back to room at end of session and agreeable to remain in w/c with seat alarm on, call bell within reach and needs met.      Therapy Documentation Precautions:  Precautions Precautions: Fall Precaution Comments: Rt hemi, ataxia Restrictions Weight Bearing Restrictions: No General:   Vital Signs:  Pain: Pain Assessment Pain Scale:  0-10 Pain Score: 0-No pain Therapy/Group: Individual Therapy  Darrelyn Morro  Falana Clagg, PTA  08/22/2019, 1:00 PM

## 2019-08-22 NOTE — Progress Notes (Signed)
[facility] PHYSICAL MEDICINE & REHABILITATION PROGRESS NOTE   Subjective/Complaints: Feels like she is doing ok in therapy ell  ROS: Patient denies CP, SOB, N/V/D Objective:   No results found. Recent Labs    08/22/19 0501  WBC 5.1  HGB 9.4*  HCT 28.5*  PLT 609*   Recent Labs    08/22/19 0501  NA 139  K 3.5  CL 106  CO2 24  GLUCOSE 100*  BUN 12  CREATININE 1.18*  CALCIUM 9.1    Intake/Output Summary (Last 24 hours) at 08/22/2019 0754 Last data filed at 08/21/2019 2140 Gross per 24 hour  Intake 600 ml  Output -  Net 600 ml     Physical Exam: Vital Signs Blood pressure 138/87, pulse 94, temperature 99 F (37.2 C), temperature source Oral, resp. rate 16, height 5\' 3"  (1.6 m), weight 56.8 kg, SpO2 100 %.   General: No acute distress Mood and affect are appropriate Heart: Regular rate and rhythm no rubs murmurs or extra sounds Lungs: Clear to auscultation, breathing unlabored, no rales or wheezes Abdomen: Positive bowel sounds, soft nontender to palpation, nondistended Extremities: No clubbing, cyanosis, or edema Skin: No evidence of breakdown, no evidence of rash Neurologic:motor strength is 5/5 in bilateral deltoid, bicep, tricep, grip, hip flexor, knee extensors, ankle dorsiflexor and plantar flexor Sensory exam normal sensation to light touch and proprioception in bilateral upper and lower extremities  Musculoskeletal: Full range of motion in all 4 extremities. No joint swelling Psych: Pt's affect is appropriate. Pt is cooperative      Assessment/Plan: 1. Functional deficits secondary to bilateral cerebral infarcts which require 3+ hours per day of interdisciplinary therapy in a comprehensive inpatient rehab setting.  Physiatrist is providing close team supervision and 24 hour management of active medical problems listed below.  Physiatrist and rehab team continue to assess barriers to discharge/monitor patient progress toward functional and medical  goals  Care Tool:  Bathing    Body parts bathed by patient: Right arm, Left arm, Chest, Abdomen, Front perineal area, Buttocks, Right upper leg, Left upper leg, Left lower leg, Right lower leg, Face         Bathing assist Assist Level: Contact Guard/Touching assist     Upper Body Dressing/Undressing Upper body dressing   What is the patient wearing?: Pull over shirt    Upper body assist Assist Level: Supervision/Verbal cueing    Lower Body Dressing/Undressing Lower body dressing      What is the patient wearing?: Underwear/pull up     Lower body assist Assist for lower body dressing: Supervision/Verbal cueing     Toileting Toileting Toileting Activity did not occur (Clothing management and hygiene only): N/A (no void or bm)  Toileting assist Assist for toileting: Contact Guard/Touching assist     Transfers Chair/bed transfer  Transfers assist     Chair/bed transfer assist level: Contact Guard/Touching assist     Locomotion Ambulation   Ambulation assist      Assist level: Moderate Assistance - Patient 50 - 74% Assistive device: No Device Max distance: 10   Walk 10 feet activity   Assist     Assist level: Moderate Assistance - Patient - 50 - 74% Assistive device: Hand held assist   Walk 50 feet activity   Assist Walk 50 feet with 2 turns activity did not occur: Safety/medical concerns         Walk 150 feet activity   Assist Walk 150 feet activity did not occur: Safety/medical concerns  Walk 10 feet on uneven surface  activity   Assist Walk 10 feet on uneven surfaces activity did not occur: Safety/medical concerns         Wheelchair     Assist   Type of Wheelchair: Manual    Wheelchair assist level: Supervision/Verbal cueing Max wheelchair distance: 150    Wheelchair 50 feet with 2 turns activity    Assist        Assist Level: Supervision/Verbal cueing   Wheelchair 150 feet activity      Assist      Assist Level: Supervision/Verbal cueing   Blood pressure 138/87, pulse 94, temperature 99 F (37.2 C), temperature source Oral, resp. rate 16, height 5\' 3"  (1.6 m), weight 56.8 kg, SpO2 100 %.  Medical Problem List and Plan: 1.Right side weakness with decreased balance and dysphagiasecondary to bilateral cerebral convexity infarct frontal region -patient may  shower -ELOS/Goals: 5-7d Mod I/Sup ADL and mobility   -Continue CIR therapies including PT, OT, and SLP  2. Antithrombotics: -DVT/anticoagulation:SCDs -antiplatelet therapy: ONLY plavix for now 3. Pain Management:Tylenol as needed 4. Mood:Provide emotional support -antipsychotic agentsSeroquel 25 mg nightly 5. Neuropsych: This patientiscapable of making decisions on herown behalf. 6. Skin/Wound Care:Routine skin checks 7. Fluids/Electrolytes/Nutrition:Routine in and outs with follow-up chemistries 8. Dysphagia. soft/thins   -tolerating thus far.   -adv diet per SLP 9. Hypertension. Norvasc 10 mg daily, clonidine 0.1 mg twice daily.   -intermittently elevated. (decreased this morning 4/4)  -observe only for now 10. Seizure prophylaxis. Keppra 500 mg daily 11. Hyperlipidemia. Zocor 12. History of polysubstance abuse alcohol use. Urine drug screen positive cocaine. Counseling 13. Hematemesis. Upper GI endoscopy 08/16/2019. Continue Protonix twice daily. 14.  ABLE Hgb stable at 9.4 4/5    LOS: 3 days A FACE TO FACE EVALUATION WAS PERFORMED  Charlett Blake 08/22/2019, 7:54 AM

## 2019-08-22 NOTE — Care Management (Signed)
   Patient Details  Name: Cindy [name] MRN: [id number] Date of Birth: 1960-06-23  Today's Date: 08/22/2019  Problem List:  Patient Active Problem List   Diagnosis Date Noted  . 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 Cerebral embolism with cerebral infarction 08/13/2019  . Status epilepticus ([facility]) 08/05/2019  . Acute encephalopathy 08/05/2019  . TRANSAMINASES, SERUM, ELEVATED 10/23/2009  . HYPERCHOLESTEROLEMIA 07/03/2008  . ALCOHOL USE 07/03/2008  . Essential hypertension 07/03/2008  . STROKE 07/03/2008  . DYSPHAGIA UNSPECIFIED 07/03/2008  . DIABETES MELLITUS, BORDERLINE 07/03/2008  . Pain in joint, shoulder region 06/22/2008  . SPONDYLOSIS 06/22/2008  . CERVICALGIA 06/22/2008  . SPINAL STENOSIS 06/22/2008  . CERVICAL SPASM 06/22/2008   Past Medical History:  Past Medical History:  Diagnosis Date  . Abnormal LFTs    Hx of  . CVA (cerebral vascular accident) ([facility]) 2005  . Hyperlipidemia   . Hypertension   . Polysubstance dependence including opioid type drug, episodic abuse ([facility])    cocaine/etoh    Past Surgical History:  Past Surgical History:  Procedure Laterality Date  . ESOPHAGOGASTRODUODENOSCOPY N/A 08/16/2019   Procedure: ESOPHAGOGASTRODUODENOSCOPY (EGD);  Surgeon: Vida Rigger, MD;  Location[facility] ENDOSCOPY;  Service: Endoscopy;  Laterality: N/A;   Social History:  reports that she has never smoked. She has never used smokeless tobacco. She reports current alcohol use. She reports that she does not use drugs.  Family / Support Systems Marital Status: Married Patient Roles: Spouse Other Supports: Sister Anticipated Caregiver: spouse Ability/Limitations of Caregiver: no limitations Caregiver Availability: 24/7  Social History Preferred language: English Religion:  Employment Status: Unemployed Date Retired/Disabled/Unemployed: Previously worked as a Office manager Can  Be Completed: Yes Physical Abuse: Denies Verbal Abuse: Denies Sexual Abuse: Denies Exploitation of patient/patient's resources: Denies Self-Neglect: Denies  Emotional Status Pt's affect, behavior and adjustment status: Normal mood, affect and behavior Recent Psychosocial Issues: Seroquel at HS Substance Abuse History: ETOH and Polysubstance abuse  Patient / Family Perceptions, Expectations & Goals Pt/Family understanding of illness & functional limitations: Patient has a fair understanding of current health and functional limitations Premorbid pt/family roles/activities: Independent prior to admission Anticipated changes in roles/activities/participation: May require supervision and assistance with transportation at discharge Pt/family expectations/goals: Patient reports she wants to be able to go home and care for herself by herself  [employer] available at discharge: Spouse can provide transportation at discharge Resource referrals recommended: Neuropsychology  Discharge Planning Living Arrangements: Spouse/significant other Support Systems: Other relatives Type of Residence: Private residence Insurance Resources: Self-pay Money Management: Spouse Does the patient have any problems obtaining your medications?: No Home Management: Spouse will manage the home Patient/Family Preliminary Plans: Sister to come and live with the patient and her spouse at discharge Sw Barriers to Discharge: Decreased caregiver support, New diabetic, Medication compliance Social Work Anticipated Follow Up Needs: HH/OP Expected length of stay: ELOS 7 to 10 days  Clinical Impression Patient very pleasant and reports this is all new, never had any trouble with seizures before this. Noted her husband and sister were supportive and sister coming to her home to assist at discharge. Reviewed HH recommendations and DME, reported my husband "will get what ever I need even if we have to  pay for it". Reviewed ELOS and follow up. Appreciative of assistance and anxious to get back home to normal routine. Chana Bode B 08/22/2019, 9:07 AM

## 2019-08-22 NOTE — Progress Notes (Signed)
Speech Language Pathology Daily Session Note  Patient Details  Name: Cindy [name] MRN: [id number] Date of Birth: 1960-07-15  Today's Date: 08/22/2019 SLP Individual Time: 0905-1003 SLP Individual Time Calculation (min): 58 min  Short Term Goals: Week 1: SLP Short Term Goal 1 (Week 1): STG=LTG due to ELOS  Skilled Therapeutic Interventions: Pt was seen for skilled ST targeting dysphagia and cognitive goals. SLP facilitated session with thin H2O trials in compliance with water protocol. Pt exhibited immediate cough or throat clear in ~50% trials when taking consecutive sips of thin, however no overt s/sx aspiration observed with single cup sips. Although pt did not donn dentures (due to poor fit, per her report) she consumed upgraded regular texture trial with efficient mastication and oral clearance observed, no overt s/sx aspiration. Recommend upgrade to regular solids, continue nectar thick liquids and water protocol for now and ST will continue to assess readiness for liquid advancement.  Pt required Min A verbal cues for safety awareness with use of walker when ambulating with therapist to sink to provide oral care. During basic money management task from the ALFA, pt displayed set amounts of change with Supervision A verbal cues 100% accuracy, however Min A verbal cues required for error awareness and problem solving to make basic calculations to perform calculations for making change (subtraction). Pt required overall Mod A verbal and visual cues for error awareness and problem solving to interpret information on medication labels and fill out a basic pill chart task, also from the ALFA. Pt left laying in bed with alarm set and needs within reach. Continue per current plan of care.        Pain Pain Assessment Pain Scale: 0-10 Pain Score: 0-No pain  Therapy/Group: Individual Therapy  Cindy [name] 08/22/2019, 7:14 AM

## 2019-08-22 NOTE — Plan of Care (Signed)
  Problem: Consults Goal: RH STROKE PATIENT EDUCATION Description: See Patient Education module for education specifics  Outcome: Progressing Goal: Nutrition Consult-if indicated Outcome: Progressing Goal: Diabetes Guidelines if Diabetic/Glucose > 140 Description: If diabetic or lab glucose is > 140 mg/dl - Initiate Diabetes/Hyperglycemia Guidelines & Document Interventions  Outcome: Progressing   Problem: RH BOWEL ELIMINATION Goal: RH STG MANAGE BOWEL WITH ASSISTANCE Description: STG Manage Bowel with Minimal Assistance. Outcome: Progressing   Problem: RH BLADDER ELIMINATION Goal: RH STG MANAGE BLADDER WITH ASSISTANCE Description: STG Manage Bladder With Minimal Assistance Outcome: Progressing   Problem: RH SKIN INTEGRITY Goal: RH STG SKIN FREE OF INFECTION/BREAKDOWN Description: Patient will verbalize ways to prevent skin break down.  Outcome: Progressing Goal: RH STG MAINTAIN SKIN INTEGRITY WITH ASSISTANCE Description: STG Maintain Skin Integrity With Minimal Assistance. Outcome: Progressing   Problem: RH SAFETY Goal: RH STG ADHERE TO SAFETY PRECAUTIONS W/ASSISTANCE/DEVICE Description: STG Adhere to Safety Precautions With Minimal Assistance/Device. Outcome: Progressing Goal: RH STG DECREASED RISK OF FALL WITH ASSISTANCE Description: STG Decreased Risk of Fall With Minimal Assistance. Outcome: Progressing   Problem: RH KNOWLEDGE DEFICIT Goal: RH STG INCREASE KNOWLEDGE OF HYPERTENSION Description: Patient will verbalize ways to manage blood pressure.  Outcome: Progressing Goal: RH STG INCREASE KNOWLEDGE OF STROKE PROPHYLAXIS Description: Patient will verbalize signs and symptoms of a stroke by discharge.  Outcome: Progressing   

## 2019-08-22 NOTE — Progress Notes (Signed)
Occupational Therapy Session Note  Patient Details  Name: Cindy [name] MRN: [id number] Date of Birth: 19-Oct-1960  Today's Date: 08/22/2019 OT Individual Time: 2952-8413 OT Individual Time Calculation (min): 58 min   Short Term Goals: Week 1:  OT Short Term Goal 1 (Week 1): Pt will complete toilet transfer with LRAD and CGA OT Short Term Goal 2 (Week 1): Pt will complete LB dressing with supervision for sit<stand OT Short Term Goal 3 (Week 1): Pt will complete shower transfer with LRAD and CGA  Skilled Therapeutic Interventions/Progress Updates:    Pt greeted in w/c with no c/o pain, agreeable to shower. She completed bathing (sit<stand from TTB), dressing (sit<stand from [facility] and also from toilet), toileting (x2 using standard toilet), and oral care/handwashing (standing at sink) during session. All functional transfers completed at ambulatory level with Min A, vcs for forward gaze, standing closer to walker, and safe placement of device during transfers. CGA for dynamic sitting with pt able to utilize figure 4 position functionally and bilaterally, Min A for dynamic standing during stated tasks. She needed vcs to sit to wash her feet in the shower vs prop one LE up on the bench in standing. Set her up to eat lunch and provided intermittent supervision for eating while discussing her DME needs for home. Also provided pt with a foam block HEP to work on bilateral UE coordination, emphasized in hand manipulations as this is challenging for her. Left pt in the w/c with all needs within reach and chair alarm set, still eating her meal.    Therapy Documentation Precautions:  Precautions Precautions: Fall Precaution Comments: Rt hemi, ataxia Restrictions Weight Bearing Restrictions: No ADL: ADL Eating: Set up Grooming: Contact guard Where Assessed-Grooming: Standing at sink Upper Body Bathing: Setup Where Assessed-Upper Body Bathing: Edge of bed Lower Body Bathing: Contact guard Where  Assessed-Lower Body Bathing: Edge of bed Upper Body Dressing: Supervision/safety Where Assessed-Upper Body Dressing: Edge of bed Lower Body Dressing: Minimal assistance Where Assessed-Lower Body Dressing: Edge of bed Toileting: Not assessed Toilet Transfer: Minimal assistance Toilet Transfer Method: Ambulating(with RW) Psychologist, counselling Transfer: Minimal assistance Film/video editor Method: Ambulating(RW) Astronomer: Transfer tub bench      Therapy/Group: Individual Therapy  Clotilde Loth A Keveon Amsler 08/22/2019, 1:41 PM

## 2019-08-23 ENCOUNTER — Inpatient Hospital Stay (HOSPITAL_COMMUNITY): Payer: Self-pay | Admitting: Speech Pathology

## 2019-08-23 ENCOUNTER — Inpatient Hospital Stay (HOSPITAL_COMMUNITY): Payer: Self-pay

## 2019-08-23 ENCOUNTER — Inpatient Hospital Stay (HOSPITAL_COMMUNITY): Payer: Self-pay | Admitting: Physical Therapy

## 2019-08-23 ENCOUNTER — Encounter (HOSPITAL_COMMUNITY): Payer: Self-pay | Admitting: Psychology

## 2019-08-23 NOTE — Plan of Care (Signed)
  Problem: Consults Goal: RH STROKE PATIENT EDUCATION Description: See Patient Education module for education specifics  Outcome: Progressing Goal: Nutrition Consult-if indicated Outcome: Progressing Goal: Diabetes Guidelines if Diabetic/Glucose > 140 Description: If diabetic or lab glucose is > 140 mg/dl - Initiate Diabetes/Hyperglycemia Guidelines & Document Interventions  Outcome: Progressing   Problem: RH BOWEL ELIMINATION Goal: RH STG MANAGE BOWEL WITH ASSISTANCE Description: STG Manage Bowel with Minimal Assistance. Outcome: Progressing   Problem: RH BLADDER ELIMINATION Goal: RH STG MANAGE BLADDER WITH ASSISTANCE Description: STG Manage Bladder With Minimal Assistance Outcome: Progressing   Problem: RH SKIN INTEGRITY Goal: RH STG SKIN FREE OF INFECTION/BREAKDOWN Description: Patient will verbalize ways to prevent skin break down.  Outcome: Progressing Goal: RH STG MAINTAIN SKIN INTEGRITY WITH ASSISTANCE Description: STG Maintain Skin Integrity With Minimal Assistance. Outcome: Progressing   Problem: RH SAFETY Goal: RH STG ADHERE TO SAFETY PRECAUTIONS W/ASSISTANCE/DEVICE Description: STG Adhere to Safety Precautions With Minimal Assistance/Device. Outcome: Progressing Goal: RH STG DECREASED RISK OF FALL WITH ASSISTANCE Description: STG Decreased Risk of Fall With Minimal Assistance. Outcome: Progressing   Problem: RH KNOWLEDGE DEFICIT Goal: RH STG INCREASE KNOWLEDGE OF HYPERTENSION Description: Patient will verbalize ways to manage blood pressure.  Outcome: Progressing Goal: RH STG INCREASE KNOWLEDGE OF STROKE PROPHYLAXIS Description: Patient will verbalize signs and symptoms of a stroke by discharge.  Outcome: Progressing   

## 2019-08-23 NOTE — Progress Notes (Signed)
[facility] PHYSICAL MEDICINE & REHABILITATION PROGRESS NOTE   Subjective/Complaints:  Eating breakfast , RN note 99 temp, pt denies cough, cold sx, burning with urination   ROS: Patient denies CP, SOB, N/V/D Objective:   No results found. Recent Labs    08/22/19 0501  WBC 5.1  HGB 9.4*  HCT 28.5*  PLT 609*   Recent Labs    08/22/19 0501  NA 139  K 3.5  CL 106  CO2 24  GLUCOSE 100*  BUN 12  CREATININE 1.18*  CALCIUM 9.1    Intake/Output Summary (Last 24 hours) at 08/23/2019 0726 Last data filed at 08/22/2019 1900 Gross per 24 hour  Intake 340 ml  Output --  Net 340 ml     Physical Exam: Vital Signs Blood pressure 127/73, pulse 88, temperature 99 F (37.2 C), temperature source Oral, resp. rate 18, height 5\' 3"  (1.6 m), weight 57 kg, SpO2 100 %.    General: No acute distress Mood and affect are appropriate Heart: Regular rate and rhythm no rubs murmurs or extra sounds Lungs: Clear to auscultation, breathing unlabored, no rales or wheezes Abdomen: Positive bowel sounds, soft nontender to palpation, nondistended Extremities: No clubbing, cyanosis, or edema Skin: No evidence of breakdown, no evidence of rash Neurologic: motor strength is 5/5 in bilateral deltoid, bicep, tricep, grip, hip flexor, knee extensors, ankle dorsiflexor and plantar flexor   Musculoskeletal: Full range of motion in all 4 extremities. No joint swelling Psych: Pt's affect is appropriate. Pt is cooperative      Assessment/Plan: 1. Functional deficits secondary to bilateral cerebral infarcts which require 3+ hours per day of interdisciplinary therapy in a comprehensive inpatient rehab setting.  Physiatrist is providing close team supervision and 24 hour management of active medical problems listed below.  Physiatrist and rehab team continue to assess barriers to discharge/monitor patient progress toward functional and medical goals  Care Tool:  Bathing    Body parts bathed by  patient: Right arm, Left arm, Chest, Abdomen, Front perineal area, Buttocks, Right upper leg, Left upper leg, Left lower leg, Right lower leg, Face         Bathing assist Assist Level: Contact Guard/Touching assist     Upper Body Dressing/Undressing Upper body dressing   What is the patient wearing?: Pull over shirt    Upper body assist Assist Level: Set up assist    Lower Body Dressing/Undressing Lower body dressing      What is the patient wearing?: Underwear/pull up, Pants     Lower body assist Assist for lower body dressing: Contact Guard/Touching assist     Toileting Toileting Toileting Activity did not occur (Clothing management and hygiene only): N/A (no void or bm)  Toileting assist Assist for toileting: Contact Guard/Touching assist     Transfers Chair/bed transfer  Transfers assist     Chair/bed transfer assist level: Contact Guard/Touching assist     Locomotion Ambulation   Ambulation assist      Assist level: Contact Guard/Touching assist Assistive device: Walker-rolling Max distance: 76ft   Walk 10 feet activity   Assist     Assist level: Moderate Assistance - Patient - 50 - 74% Assistive device: Hand held assist   Walk 50 feet activity   Assist Walk 50 feet with 2 turns activity did not occur: Safety/medical concerns         Walk 150 feet activity   Assist Walk 150 feet activity did not occur: Safety/medical concerns  Walk 10 feet on uneven surface  activity   Assist Walk 10 feet on uneven surfaces activity did not occur: Safety/medical concerns         Wheelchair     Assist   Type of Wheelchair: Manual    Wheelchair assist level: Supervision/Verbal cueing Max wheelchair distance: 150    Wheelchair 50 feet with 2 turns activity    Assist        Assist Level: Supervision/Verbal cueing   Wheelchair 150 feet activity     Assist      Assist Level: Supervision/Verbal cueing    Blood pressure 127/73, pulse 88, temperature 99 F (37.2 C), temperature source Oral, resp. rate 18, height 5\' 3"  (1.6 m), weight 57 kg, SpO2 100 %.  Medical Problem List and Plan: 1.Right side weakness with decreased balance and dysphagiasecondary to bilateral cerebral convexity infarct frontal region -patient may  shower -ELOS/Goals: 5-7d Mod I/Sup ADL and mobility   -Continue CIR therapies including PT, OT, and SLP - team conf in am  2. Antithrombotics: -DVT/anticoagulation:SCDs -antiplatelet therapy: ONLY plavix for now 3. Pain Management:Tylenol as needed 4. Mood:Provide emotional support -antipsychotic agentsSeroquel 25 mg nightly 5. Neuropsych: This patientiscapable of making decisions on herown behalf. 6. Skin/Wound Care:Routine skin checks 7. Fluids/Electrolytes/Nutrition:Routine in and outs with follow-up chemistries 8. Dysphagia. soft/thins   -tolerating thus far.   -adv diet per SLP 9. Hypertension. Norvasc 10 mg daily, clonidine 0.1 mg twice daily.    Vitals:   08/22/19 1912 08/23/19 0421  BP: (!) 152/89 127/73  Pulse: 89 88  Resp: 18 18  Temp: 98.9 F (37.2 C) 99 F (37.2 C)  SpO2: 100% 100%     10. Seizure prophylaxis. Keppra 500 mg daily 11. Hyperlipidemia. Zocor 12. History of polysubstance abuse alcohol use. Urine drug screen positive cocaine. Counseling 13. Hematemesis. Upper GI endoscopy 08/16/2019.Barrett's esophagus  Continue Protonix twice daily. 14.  ABLA Hgb stable at 9.4 4/5    LOS: 4 days A FACE TO FACE EVALUATION WAS PERFORMED  Charlett Blake 08/23/2019, 7:26 AM

## 2019-08-23 NOTE — Progress Notes (Signed)
Occupational Therapy Session Note  Patient Details  Name: Cindy [name] MRN: [id number] Date of Birth: 1961-05-06  Today's Date: 08/23/2019 OT Individual Time: 1000-1100 OT Individual Time Calculation (min): 60 min    Short Term Goals: Week 1:  OT Short Term Goal 1 (Week 1): Pt will complete toilet transfer with LRAD and CGA OT Short Term Goal 2 (Week 1): Pt will complete LB dressing with supervision for sit<stand OT Short Term Goal 3 (Week 1): Pt will complete shower transfer with LRAD and CGA  Skilled Therapeutic Interventions/Progress Updates:    Pt sitting EOB upon arrival and agreeable to therapy.  Pt declined bathing or changing clothing this morning.  OT intervention with focus on functional amb with RW, functional transfers, bed mobility, standing balance, RW safety, general safety awareness, and activity tolerance to increase independence with BADLs.  All functional transfers and amb with CGA/close supervision.  Pt requires mod verbal cues for RW management with stand>sit. Pt practiced short distance amb and stand>sit X 5 to reinforce sequencing. Pt required min verbal cues at end of session.  Pt engaged in standing task with horseshoes; tossing and retrieving from floor with squat. Pt required verbal cues for placement of LUE on RW when squatting. Pt returned to room and remained seated EOB with bed alarm activated. All needs within reach.   Therapy Documentation Precautions:  Precautions Precautions: Fall Precaution Comments: Rt hemi, ataxia Restrictions Weight Bearing Restrictions: No   Pain:  Pt denies pain this morning   Therapy/Group: Individual Therapy  Rich Brave 08/23/2019, 12:16 PM

## 2019-08-23 NOTE — Progress Notes (Signed)
Physical Therapy Session Note  Patient Details  Name: RHAYNE CHATWIN MRN: [id number] Date of Birth: [birth year]  Today's Date: 08/23/2019 PT Individual Time: 0805-0905 PT Individual Time Calculation (min): 60 min   Short Term Goals: Week 1:  PT Short Term Goal 1 (Week 1): STG=LTG due tp ELOS  Skilled Therapeutic Interventions/Progress Updates: Pt presented at EOB completing breakfast and agreeable to therapy. Pt denies pain and states had good night. Pt doffed gown and donned scrub pants/shirt with set up. Pt ambulated to sink with RW and CGA and performed oral hygiene standing at sink with set up. Transferred to w/c and transported to day room. Pt transferred to NuStep with RW and CGA and participated in NuStep L2 x 6 min for reciprocal activity and global endurance. Ambulated to mat and participated in balance/strengthening activities including STS no AD x 5 then x5 while standing on balance foam to while working on righting reactions and increased ankle strategy. Participated in gait training without AD 76ft x 2 with CGA fading to minA with increased sway to R with fatigue however no significant LOB. Pt then ambulated 113ft with RW and CGA with cues for improved erect posture, increased heel strike, increased R foot clearance. Pt noted to have improved gait pattern when encouraged to increase speed. Pt ambulated additional 41ft x 2 with increased cadence with improved pattern. Pt then ambulated 111ft with RW and CGA. Pt noted to have decreased cadence and decreased B knee flexion when ambulating with RW but improved when PTA encouraged increased cadence. Pt then ambulated additional 3ft x 2 with RW and cues for increased cadence with improving B knee flexion and heel strike. Discussed with pt use of treadmill for next session. Pt transferred to w/c and transported back to room. Pt requesting to use bathroom once in room, performed ambulatory transfer to toilet with RW and CGA. Pt left at toilet  verbalizing understanding to use call bell once completed and nsg notified.      Therapy Documentation Precautions:  Precautions Precautions: Fall Precaution Comments: Rt hemi, ataxia Restrictions Weight Bearing Restrictions: No General:   Vital Signs:   Pain:      Therapy/Group: Individual Therapy  Jerek Meulemans  Bubba Vanbenschoten, PTA  08/23/2019, 12:54 PM

## 2019-08-23 NOTE — Progress Notes (Signed)
Speech Language Pathology Daily Session Note  Patient Details  Name: Cindy [name] MRN: [id number] Date of Birth: 10-19-1960  Today's Date: 08/23/2019 SLP Individual Time: 1300-1359 SLP Individual Time Calculation (min): 59 min  Short Term Goals: Week 1: SLP Short Term Goal 1 (Week 1): STG=LTG due to ELOS  Skilled Therapeutic Interventions: Pt was seen for skilled ST targeting dysphagia and cognitive goals. Pt with excellent verbal recall of water protocol rules and specific swallow strategies for intake of thin liquids (small cup sips, one at a time as opposed to consecutive). After completing oral care, pt accepted ~4oz thin H2O with 1 subtle throat clear noted. Clear vocal quality throughout intake. Recommend pt upgrade to thin liquids but NO straws allow, must take small single sips.  SLP further facilitated session with overall Mod A verbal and visual cues for problem solving during a semi-complex monthly scheduling task. Pt required only Min A verbal cues for awareness of errors, but sinificantly more cueing to correct them. Pt with good insight into level of difficulty this task presented her after completion and expressed desire to continue to target functional scheduling tasks. She reported she did use a calendar at home to help track appointments, bills, etc and expected it to be easier than it was. She also noted her handwriting is far less legible than baseline. Discussed option to use electronic calendar to eliminate handwriting aspect. Pt left sitting edge of bed with alarm set and needs within reach. Continue per current plan of care.       Pain Pain Assessment Pain Scale: 0-10 Pain Score: 0-No pain  Therapy/Group: Individual Therapy  [name] 08/23/2019, 7:11 AM

## 2019-08-23 NOTE — Progress Notes (Signed)
Physical Therapy Session Note  Patient Details  Name: Cindy [name] MRN: [id number] Date of Birth: Jun 23, 1960  Today's Date: 08/23/2019 PT Individual Time: 9741-6384 PT Individual Time Calculation (min): 30 min   Short Term Goals: Week 1:  PT Short Term Goal 1 (Week 1): STG=LTG due tp ELOS  Skilled Therapeutic Interventions/Progress Updates:    Pt received sitting EOB and agreeable to therapy session. Donned B shoes max assist for time management. R stand pivot EOB>w/c, no AD, with CGA for steadying.  Transported to/from gym in w/c for time management and energy conservation. Standing with UE support on litegait and CGA for steadying therapist donned litegait harness. While in litegait harness for safety stepped on/off treadmill with CGA/min assist for balance and cuing for sequencing. Participated in the following locomotor treadmill training trails using litegait harness for safety but no significant body weight support provided: 1st trial: 65min58secs at 1.47mph using B UE support totaling 137ft - therapist providing min assist to facilitate increased R LE hip/knee flexion during swing gradually decreasing assist with increased repetitions - cuing throughout for symmetrical pattern with increased B LE hip/knee flexion during swing - pt noted to be leaning forward into the harness therefore cuing/manual facilitation for upright posture 2nd trial: 45min54secs at 1.54mph without UE support totaling 225ft - pt noted to have increased postural sway and worsening B LE gait mechanics without UE support therefore therapist provided increased assistance/facilitation at hips to coordinate R/L lateral weight shift onto stance limb while facilitating RLE increased hip/knee flexion during swing  3rd trial: 38min30secs at 1.70mph totaling 343ft with +2 present to facilitate R/L lateral weight shift and therapist initially facilitating increased R LE hip/knee flexion during swing but progressed to no assist and pt  demonstrating improved gait mechanics with consistent facilitation for weight shifting Stepped off treadmill and doffed harness as described above. Transported back to room in w/c. Stand pivot w/c>EOB, no AD, with CGA for steadying. Pt requesting to use bathroom - RN present to assume care of patient.  Therapy Documentation Precautions:  Precautions Precautions: Fall Precaution Comments: Rt hemi, ataxia Restrictions Weight Bearing Restrictions: No  Pain: Denies pain during session.   Therapy/Group: Individual Therapy  Ginny Forth, PT, DPT  08/23/2019, 1:00 PM

## 2019-08-24 ENCOUNTER — Inpatient Hospital Stay (HOSPITAL_COMMUNITY): Payer: Self-pay | Admitting: Speech Pathology

## 2019-08-24 ENCOUNTER — Inpatient Hospital Stay (HOSPITAL_COMMUNITY): Payer: Self-pay | Admitting: Occupational Therapy

## 2019-08-24 ENCOUNTER — Inpatient Hospital Stay (HOSPITAL_COMMUNITY): Payer: Self-pay | Admitting: Physical Therapy

## 2019-08-24 MED ORDER — AMLODIPINE BESYLATE 5 MG PO TABS
5.0000 mg | ORAL_TABLET | Freq: Every day | ORAL | Status: DC
  Administered 2019-08-24 – 2019-08-27 (×4): 5 mg via ORAL
  Filled 2019-08-24 (×4): qty 1

## 2019-08-24 NOTE — Progress Notes (Addendum)
[facility] PHYSICAL MEDICINE & REHABILITATION PROGRESS NOTE   Subjective/Complaints:  No new issues overnight she had a good breakfast feels like therapies are going well for her.  No bowel or bladder complaints.  She is anxious to go home ROS: Patient denies CP, SOB, N/V/D Objective:   No results found. Recent Labs    08/22/19 0501  WBC 5.1  HGB 9.4*  HCT 28.5*  PLT 609*   Recent Labs    08/22/19 0501  NA 139  K 3.5  CL 106  CO2 24  GLUCOSE 100*  BUN 12  CREATININE 1.18*  CALCIUM 9.1    Intake/Output Summary (Last 24 hours) at 08/24/2019 0728 Last data filed at 08/23/2019 0830 Gross per 24 hour  Intake 220 ml  Output --  Net 220 ml     Physical Exam: Vital Signs Blood pressure (!) 149/82, pulse 93, temperature 98.7 F (37.1 C), temperature source Oral, resp. rate 16, height 5' 3"  (1.6 m), weight 56.1 kg, SpO2 100 %.   General: No acute distress Mood and affect are appropriate Heart: Regular rate and rhythm no rubs murmurs or extra sounds Lungs: Clear to auscultation, breathing unlabored, no rales or wheezes Abdomen: Positive bowel sounds, soft nontender to palpation, nondistended Extremities: No clubbing, cyanosis, or edema Skin: No evidence of breakdown, no evidence of rash Neuro:  Eyes without evidence of nystagmus  Tone is normal without evidence of spasticity Cerebellar exam shows no evidence of ataxia on finger nose finger or heel to shin testing No evidence of trunkal ataxia  Motor strength is 5/5 in bilateral deltoid, biceps, triceps, finger flexors and extensors, wrist flexors and extensors, hip flexors, knee flexors and extensors, ankle dorsiflexors, plantar flexors, invertors and evertors, toe flexors and extensors     Musculoskeletal: Full range of motion in all 4 extremities. No joint swelling Psych: Pt's affect is appropriate. Pt is cooperative      Assessment/Plan: 1. Functional deficits secondary to bilateral cerebral infarcts which  require 3+ hours per day of interdisciplinary therapy in a comprehensive inpatient rehab setting.  Physiatrist is providing close team supervision and 24 hour management of active medical problems listed below.  Physiatrist and rehab team continue to assess barriers to discharge/monitor patient progress toward functional and medical goals  Care Tool:  Bathing    Body parts bathed by patient: Right arm, Left arm, Chest, Abdomen, Front perineal area, Buttocks, Right upper leg, Left upper leg, Left lower leg, Right lower leg, Face         Bathing assist Assist Level: Contact Guard/Touching assist     Upper Body Dressing/Undressing Upper body dressing   What is the patient wearing?: Pull over shirt    Upper body assist Assist Level: Set up assist    Lower Body Dressing/Undressing Lower body dressing      What is the patient wearing?: Underwear/pull up, Pants     Lower body assist Assist for lower body dressing: Contact Guard/Touching assist     Toileting Toileting Toileting Activity did not occur (Clothing management and hygiene only): N/A (no void or bm)  Toileting assist Assist for toileting: Contact Guard/Touching assist     Transfers Chair/bed transfer  Transfers assist     Chair/bed transfer assist level: Contact Guard/Touching assist     Locomotion Ambulation   Ambulation assist      Assist level: Moderate Assistance - Patient 50 - 74% Assistive device: Lite Gait Max distance: 319f   Walk 10 feet activity   Assist  Assist level: Contact Guard/Touching assist Assistive device: Walker-rolling   Walk 50 feet activity   Assist Walk 50 feet with 2 turns activity did not occur: Safety/medical concerns  Assist level: Contact Guard/Touching assist Assistive device: Walker-rolling    Walk 150 feet activity   Assist Walk 150 feet activity did not occur: Safety/medical concerns  Assist level: Contact Guard/Touching assist Assistive  device: Walker-rolling    Walk 10 feet on uneven surface  activity   Assist Walk 10 feet on uneven surfaces activity did not occur: Safety/medical concerns         Wheelchair     Assist   Type of Wheelchair: Manual    Wheelchair assist level: Supervision/Verbal cueing Max wheelchair distance: 150    Wheelchair 50 feet with 2 turns activity    Assist        Assist Level: Supervision/Verbal cueing   Wheelchair 150 feet activity     Assist      Assist Level: Supervision/Verbal cueing   Blood pressure (!) 149/82, pulse 93, temperature 98.7 F (37.1 C), temperature source Oral, resp. rate 16, height 5' 3"  (1.6 m), weight 56.1 kg, SpO2 100 %.  Medical Problem List and Plan: 1.Right side weakness with decreased balance and dysphagiasecondary to bilateral cerebral convexity infarct frontal region -patient may  shower -ELOS/Goals: 5-7d Mod I/Sup ADL and mobility   -Continue CIR therapies including PT, OT, and SLP - Team conference today please see physician documentation under team conference tab, met with team  to discuss problems,progress, and goals. Formulized individual treatment plan based on medical history, underlying problem and comorbidities.  2. Antithrombotics: -DVT/anticoagulation:SCDs -antiplatelet therapy: ONLY plavix for now 3. Pain Management:Tylenol as needed 4. Mood:Provide emotional support -antipsychotic agentsSeroquel 25 mg nightly 5. Neuropsych: This patientiscapable of making decisions on herown behalf. 6. Skin/Wound Care:Routine skin checks 7. Fluids/Electrolytes/Nutrition:Routine in and outs with follow-up chemistries 8. Dysphagia. soft/thins   -tolerating thus far.   -adv diet per SLP 9. Hypertension. Norvasc 10 mg daily, clonidine 0.1 mg twice daily. at home    Vitals:   08/23/19 1926 08/24/19 0556  BP: (!) 151/90 (!) 149/82  Pulse: 85 93  Resp: 18 16  Temp:  98.9 F (37.2 C) 98.7 F (37.1 C)  SpO2: 100% 100%  resume amlodipine at 15m 08/24/2019   10. Seizure prophylaxis. Keppra 500 mg daily 11. Hyperlipidemia. Zocor 12. History of polysubstance abuse alcohol use. Urine drug screen positive cocaine. Counseling 13. Hematemesis. Upper GI endoscopy 08/16/2019.Barrett's esophagus  Continue Protonix twice daily. 14.  ABLA Hgb stable at 9.4 4/5    LOS: 5 days A FACE TO FACE EVALUATION WAS PERFORMED  ACharlett Blake4/11/2019, 7:28 AM

## 2019-08-24 NOTE — Progress Notes (Signed)
Speech Language Pathology Daily Session Note  Patient Details  Name: Cindy [name] MRN: [id number] Date of Birth: Nov 02, 1960  Today's Date: 08/24/2019 SLP Individual Time: 4098-1191 SLP Individual Time Calculation (min): 60 min  Short Term Goals: Week 1: SLP Short Term Goal 1 (Week 1): STG=LTG due to ELOS  Skilled Therapeutic Interventions: Pt was seen for skilled ST targeting dysphagia and cognitive goals. Pt's husband present for 3/4 session and receptive to recommendation for 24/7 supervision for cognition at discharge. He was observant and appropriately engaged throughout session.  Pt reported awareness she needed to start taking blood pressure medicine per MD report this morning, but with no recall of other medication functions when provided with the name. List and verbal review of current medications provided to assist with carryover and future recall. Pt completed a complex medication management task in which she organized a BID pill box according to her list of current medications with initial demonstration cues to determine difference between 1X VS 2X and AM VS PM medications. Min A verbal cues required for awareness of erorrs. Discussed strategies to increase accuracy in the future such as pausing to double check prior to moving on to next pill during pill box organization. Would recommend repeating to activity prior to d/c to gauge carryover and see if accuracy improves. Pt also used schedule to recall past and anticipate future therapy appointments Mod I. Pt able to verbally recall swallow strategies and recommendations with Supervision A questions cues. SLP also provided a regular texture and thin liquid snack, during which pt demonstrated use of swallow strategies Mod I. No overt s/sx aspiration observed across solids or liquids. Mastication was timely and total oral clearance achieved. Pt left sitting edge of bed with alarm set and needs met to her satisfaction. Continue per current plan  of care.           Pain Pain Assessment Pain Scale: 0-10 Pain Score: 0-No pain  Therapy/Group: Individual Therapy  Arbutus Leas 08/24/2019, 6:58 AM

## 2019-08-24 NOTE — Patient Care Conference (Signed)
Inpatient RehabilitationTeam Conference and Plan of Care Update Date: 08/24/2019   Time: 10:05 AM    Patient Name: Cindy [name]      Medical Record Number: [id number]  Date of Birth: [birth year] Sex: Female         Room/Bed: 4M01C/4M01C-01 Payor Info: Payor: /    Admit Date/Time:  08/19/2019  2:13 PM  Primary Diagnosis:  Cerebral embolism with cerebral infarction [facility])  Patient Active Problem List   Diagnosis Date Noted  . 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 Multiple cerebral infarctions ([place]) 08/19/2019  . Cerebral embolism with cerebral infarction 08/13/2019  . Status epilepticus ([place]) 08/05/2019  . Acute encephalopathy 08/05/2019  . TRANSAMINASES, SERUM, ELEVATED 10/23/2009  . HYPERCHOLESTEROLEMIA 07/03/2008  . ALCOHOL USE 07/03/2008  . Essential hypertension 07/03/2008  . STROKE 07/03/2008  . DYSPHAGIA UNSPECIFIED 07/03/2008  . DIABETES MELLITUS, BORDERLINE 07/03/2008  . Pain in joint, shoulder region 06/22/2008  . SPONDYLOSIS 06/22/2008  . CERVICALGIA 06/22/2008  . SPINAL STENOSIS 06/22/2008  . CERVICAL SPASM 06/22/2008    Expected Discharge Date: Expected Discharge Date: 08/27/19  Team Members Present: Physician leading conference: Dr. Alysia Penna Care Coodinator Present: Nestor Lewandowsky, RN, BSN, CRRN;Genie Kimiyo Carmicheal, RN, MSN Nurse Present: Vernie Murders, RN PT Present: Barrie Folk, PT OT Present: Darleen Crocker, OT SLP Present: Jettie Booze, CF-SLP PPS Coordinator present : Ileana Ladd, PT     Current Status/Progress Goal Weekly Team Focus  Bowel/Bladder   continent of bowel & bladder, LBM 4/6  remain continent  monitor & assist as needed   Swallow/Nutrition/ Hydration   Regular solids, thin liquids, Supervision A use of swallow strategies  Mod I  tolerance regular/thin, independence with use of swallow strategies   ADL's   Min A ambulatory bathroom transfers using RW, CGA bathing at shower level, Setup UB dressing, CGA LB dressing, CGA toileting   Supervision  Functional transfers, dynamic balance, UE/LE coordination, pt/family education   Mobility   supervision bed mobility, CGA transfers, CGA gait up to 138ft, mildly ataxic, decreased B knee flexion  supervision assist  balance, d/c planning, endurance   Communication             Safety/Cognition/ Behavioral Observations  Min-Mod A semi-complex problem solving, Supervision-Min error awareness and recall new information  Supervision-Min  recall strategies, semi-complex functional problem solving, error awareness   Pain   no c/o pain on shift, has no pain meds ordered  remains pain free  assess & treat as needed   Skin   small scab to the left lateral ankle, no skin break down  no new skin break down  assess q shift    Rehab Goals Patient on target to meet rehab goals: Yes *See Care Plan and progress notes for long and short-term goals.     Barriers to Discharge  Current Status/Progress Possible Resolutions Date Resolved   Nursing                  PT                    OT                  SLP                SW Decreased caregiver support;New diabetic;Medication compliance   Plan to discharge home with spouse and sister is moving in to assist          Discharge Planning/Teaching Needs:  Home  with spouse and her sister to assist with care  TBD; toileting, transfers, medications, etc   Team Discussion: 59 yo h/o HTN, polysubstance abuse, embolic strokes, BP high, meds resumed, seizure prophylaxis, h/o GI bleed, Hgb stable.  RN BM 4/6, I+O strict, reg/thins, meds whole with apple sauce, confusion, cont B/B.  OT impulsive, CGA standing balance tasks, CGA toileting, goals S.  PT S bed and transfers, gait min A no device, 150' RW CGA, ataxic, S goals.  SLP reg/thins, good carry over,min/mod prob solving and recal, goals S/min A.  Fiance and sister to assist at DC.   Revisions to Treatment Plan: N/A     Medical Summary Current Status: Patient cooperating with therapy no  signs of drug withdrawal, blood pressures, mildly elevated Weekly Focus/Goal: Adjust blood pressure medications, family caregiver education  Barriers to Discharge: Medical stability;Medication compliance   Possible Resolutions to Barriers: Educated on need for medication compliance   Continued Need for Acute Rehabilitation Level of Care: The patient requires daily medical management by a physician with specialized training in physical medicine and rehabilitation for the following reasons: Direction of a multidisciplinary physical rehabilitation program to maximize functional independence : Yes Medical management of patient stability for increased activity during participation in an intensive rehabilitation regime.: Yes Analysis of laboratory values and/or radiology reports with any subsequent need for medication adjustment and/or medical intervention. : Yes   I attest that I was present, lead the team conference, and concur with the assessment and plan of the team.   Trish Mage 08/24/2019, 4:00 PM   Team conference was held via web/ teleconference due to COVID - 19

## 2019-08-24 NOTE — Plan of Care (Signed)
  Problem: Consults Goal: RH STROKE PATIENT EDUCATION Description: See Patient Education module for education specifics  Outcome: Progressing Goal: Nutrition Consult-if indicated Outcome: Progressing Goal: Diabetes Guidelines if Diabetic/Glucose > 140 Description: If diabetic or lab glucose is > 140 mg/dl - Initiate Diabetes/Hyperglycemia Guidelines & Document Interventions  Outcome: Progressing   Problem: RH BOWEL ELIMINATION Goal: RH STG MANAGE BOWEL WITH ASSISTANCE Description: STG Manage Bowel with Minimal Assistance. Outcome: Progressing   Problem: RH BLADDER ELIMINATION Goal: RH STG MANAGE BLADDER WITH ASSISTANCE Description: STG Manage Bladder With Minimal Assistance Outcome: Progressing   Problem: RH SKIN INTEGRITY Goal: RH STG SKIN FREE OF INFECTION/BREAKDOWN Description: Patient will verbalize ways to prevent skin break down.  Outcome: Progressing Goal: RH STG MAINTAIN SKIN INTEGRITY WITH ASSISTANCE Description: STG Maintain Skin Integrity With Minimal Assistance. Outcome: Progressing   Problem: RH SAFETY Goal: RH STG ADHERE TO SAFETY PRECAUTIONS W/ASSISTANCE/DEVICE Description: STG Adhere to Safety Precautions With Minimal Assistance/Device. Outcome: Progressing Goal: RH STG DECREASED RISK OF FALL WITH ASSISTANCE Description: STG Decreased Risk of Fall With Minimal Assistance. Outcome: Progressing   Problem: RH KNOWLEDGE DEFICIT Goal: RH STG INCREASE KNOWLEDGE OF HYPERTENSION Description: Patient will verbalize ways to manage blood pressure.  Outcome: Progressing Goal: RH STG INCREASE KNOWLEDGE OF STROKE PROPHYLAXIS Description: Patient will verbalize signs and symptoms of a stroke by discharge.  Outcome: Progressing   

## 2019-08-24 NOTE — Progress Notes (Signed)
Physical Therapy Session Note  Patient Details  Name: Cindy [name] MRN: [id number] Date of Birth: 09-25-1960  Today's Date: 08/24/2019 PT Individual Time: 4098-1191 PT Individual Time Calculation (min): 72 min   Short Term Goals: Week 1:  PT Short Term Goal 1 (Week 1): STG=LTG due tp ELOS  Skilled Therapeutic Interventions/Progress Updates: Pt presented in bed agreeable to therapy. Pt denies pain during session. Performed bed mobility supervision and donned shoes with set up. Pt ambulated to ortho gym CGA to attempt to exchange RW for youth RW however none available. Pt transferred to day room via w/c for energy conservation and participated in gait no AD requiring minA and cues for increased B knee flexion, erect posture, and increased heel strike. Pt then set up in Lite Gait and participated in ground level BWS ambulation to encourage increased cadence and normalization of gait pattern. Pt ambulated 171ft x 2 with 3lb wts on ankle for biofeedback. Pt able to increase R foot clearance with forced knee flexion with wts added. Ambulated additional 130ft x 2 without AD with improved gait pattern but continuing to demonstrate increased anterior lean with ambulation. Pt then transferred outside and participated in gait with RW on uneven surfaces 125ft x 3 with seated rest. Pt able to perform with CGA overall. Pt also participated in standing hip abd/add 2 x 10 and heel rises 2 x 10. Pt transferred back to unit and agreeable to remain in w/c at end of session with NT present setting up and and agreeable to set up pt once completed.      Therapy Documentation Precautions:  Precautions Precautions: Fall Precaution Comments: Rt hemi, ataxia Restrictions Weight Bearing Restrictions: No General:   Vital Signs: Therapy Vitals Temp: 98.4 F (36.9 C) Temp Source: Oral Pulse Rate: 99 Resp: 16 BP: 127/83 Patient Position (if appropriate): Sitting Oxygen Therapy SpO2: 100 % O2 Device: Room  Air Pain: Pain Assessment Pain Scale: 0-10 Pain Score: 0-No pain    Therapy/Group: Individual Therapy  Abrahim Sargent  Thessaly Mccullers, PTA  08/24/2019, 3:53 PM

## 2019-08-24 NOTE — Consult Note (Signed)
Neuropsychological Consultation   Patient:   Cindy [name]   DOB:   [birth year]  MR Number:  [id number]  Location:  [facility] [address] CENTER [id number] [place] [address] Dept: [phone] Loc: [phone]           Date of Service:   08/23/2019  Start Time:   3 PM End Time:   4 PM  Provider/Observer:  Arley Phenix, Psy.D.       Clinical Neuropsychologist       Billing Code/Service: 44010  Chief Complaint:    Cindy [name] is a 59 year old female with a history of polysubstance abuse, hypertension, CVA 2005, hyperlipidemia.  The patient presented on 08/05/2019 and an event hospital with altered mental status and actively seizing.  The patient was noted to have eye deviation to the right with twitching of the face.  Patient treated with IV Ativan and loaded with Keppra.  Alcohol level 21, BNP 121, urine drug screen positive for cocaine.  The patient did require intubation for airway protection.  Cranial CT scan negative for acute changes.  Extensive periventricular and deep white matter hypodensities markedly increased compared to remote prior CT dated 2006.  Patient did not receive TPA.  Follow-up MRI showed scattered small foci of restricted diffusion within bilateral cerebral hemispheres consistent with small acute infarcts likely embolic in nature.  Therapy evaluations were completed and the patient was admitted to the comprehensive inpatient rehabilitation program.  Patient's cognition/mental status has improved and she is actively participating in therapeutic process.  Reason for Service:  The patient was referred for neuropsychological consultation due to adjustment and coping issues in the context of polysubstance abuse including cocaine use with history of prior CVA and significant risk factors.  Below is the HPI for the current admission.  Cindy [name] is a 59 year old right-handed female with  history of polysubstance abuse, hypertension, CVA 2005, hyperlipidemia. Per chart review lives with spouse and independent prior to admission. 1 level home one-step to entry. Presented 08/05/2019 to [facility] hospital with altered mental status and actively seizing. She was noted to have eye deviation to the right with twitching of the face. She was treated with IV Ativan and loaded with Keppra. Alcohol level 21, BNP 121, urine drug screen positive cocaine, lactic acid 2.7, urinalysis negative nitrate, sodium 124, creatinine 1.05, hemoglobin 14.9. She did require intubation for airway protection. Cranial CT scan negative for acute changes. Extensive periventricular and deep white matter hypodensity markedly increased compared to remote prior CT dated 2006. Patient did not receive TPA. She was transferred to [facility] for further evaluation. Echocardiogram with ejection fraction of 60% grade 1 diastolic dysfunction. EEG negative for seizure. Carotid Dopplers negative for ICA stenosis. Follow-up MRI showed scattered small foci of restricted diffusion within the bilateral cerebral hemispheres consistent with small acute infarcts likely embolic. Maintain on aspirin and Plavix x3 weeks followed by aspirin alone for CVA prophylaxis. Subcutaneous Lovenox for DVT prophylaxis.Gastroenterology services Dr. Ewing Schlein consulted 08/16/2019 for Hematemesis/anemiawith hemoglobin dipping to 7.1 transfuse 1 unit packed red blood cells. Patient underwentupper GI endoscopy 08/16/2019 showing medium size hiatal hernia present. There was esophageal mucosal changes suspicious for short segment Barrett's esophagus present in the lower third of the esophagus. 1 cratered small distal esophageal ulcer with stigmata of recent bleeding found. The entire examined stomach was normal and patient was placed on Protonix twice daily.Patient's aspirin and Plavix initially on hold and only  Plavix has since been resumed.  Lovenox has been discontinued. Hemoglobin remained stable at 8.8.Dysphagia #1 nectar thick liquids. Therapy evaluations completed and patient was admitted for a comprehensive rehab program.  Current Status:  The patient was oriented and aware of what it happened with little or no memory for the events themselves.  She did acknowledge that she had used cocaine in the recent past but denied using cocaine that day.  The patient admitted to a regular use of cocaine and other substances but it was hard to assess the validity of these claims.  The patient reports that she is feel like she is improving but continues to have some coordination and motor deficits but is able to walk and coordinate her hands.  The patient reports that her mood was generally positive but she is worried about how these residual effects will allow her to manage her life forward.  The patient talked about a history of anxiety and other symptoms and that she would primarily use substances for boredom and self management and medication.  Behavioral Observation: Cindy [name]  presents as a 59 y.o.-year-old Right Caucasian Female who appeared her stated age. her dress was Appropriate and she was Well Groomed and her manners were Appropriate to the situation.  her participation was indicative of Appropriate and Redirectable behaviors.  There were any physical disabilities noted.  she displayed an appropriate level of cooperation and motivation.     Interactions:    Active Appropriate and Redirectable  Attention:   abnormal and attention span appeared shorter than expected for age  Memory:   abnormal; remote memory intact, recent memory impaired  Visuo-spatial:  not examined  Speech (Volume):  normal  Speech:   normal; normal  Thought Process:  Coherent and Relevant  Though Content:  WNL; not suicidal and not homicidal  Orientation:   person, place and  time/date  Judgment:   Fair  Planning:   Poor  Affect:    Anxious  Mood:    Anxious  Insight:   Fair  Intelligence:   normal  Substance Use:  There is a documented history of alcohol, cocaine and Opioid-based drugs and medication abuse confirmed by the patient.    Medical History:   Past Medical History:  Diagnosis Date  . Abnormal LFTs    Hx of  . CVA (cerebral vascular accident) ([place]) 2005  . Hyperlipidemia   . Hypertension   . Polysubstance dependence including opioid type drug, episodic abuse ([place])    cocaine/etoh          Psychiatric History:  The patient has a significant history of polysubstance abuse and dependence including opioid type drugs and acknowledging episodic abuse of cocaine and alcohol.  Family Med/Psych History:  Family History  Problem Relation Age of Onset  . Cancer Father        prostate   . Hypertension Mother   . Asthma Mother   . Bronchitis Mother    Impression/DX:  Cindy [name] is a 59 year old female with a history of polysubstance abuse, hypertension, CVA 2005, hyperlipidemia.  The patient presented on 08/05/2019 and an event hospital with altered mental status and actively seizing.  The patient was noted to have eye deviation to the right with twitching of the face.  Patient treated with IV Ativan and loaded with Keppra.  Alcohol level 21, BNP 121, urine drug screen positive for cocaine.  The patient did require intubation for airway protection.  Cranial CT scan negative for acute changes.  Extensive periventricular and deep white matter hypodensities markedly increased compared to remote prior CT dated 2006.  Patient did not receive TPA.  Follow-up MRI showed scattered small foci of restricted diffusion within bilateral cerebral hemispheres consistent with small acute infarcts likely embolic in nature.  Therapy evaluations were completed and the patient was admitted to the comprehensive inpatient rehabilitation program.  Patient's  cognition/mental status has improved and she is actively participating in therapeutic process.  The patient was oriented and aware of what it happened with little or no memory for the events themselves.  She did acknowledge that she had used cocaine in the recent past but denied using cocaine that day.  The patient admitted to a regular use of cocaine and other substances but it was hard to assess the validity of these claims.  The patient reports that she is feel like she is improving but continues to have some coordination and motor deficits but is able to walk and coordinate her hands.  The patient reports that her mood was generally positive but she is worried about how these residual effects will allow her to manage her life forward.  The patient talked about a history of anxiety and other symptoms and that she would primarily use substances for boredom and self management and medication.  Disposition/Plan:  Today we worked on coping and adjustment issues around residual effects of her recent cerebrovascular accident with both history of prior CVA.  The patient admits to substance abuse and episodic abuse of cocaine.  We reviewed issues of substance abuse and reviewed issues related to risk factors for further strokes particularly if substance abuse continues.  Diagnosis:    Multiple cerebral infarctions (H[facility]) - Plan: Ambulatory referral to Neurology, Ambulatory referral to Physical Medicine Rehab  Dyslipidemia, goal LDL below 70 - Plan: simvastatin (ZOCOR) tablet 20 mg         Electronically Signed   _______________________ Arley Phenix, Psy.D.

## 2019-08-24 NOTE — Progress Notes (Signed)
Occupational Therapy Session Note  Patient Details  Name: Cindy [name] MRN: [id number] Date of Birth: 1960/06/23  Today's Date: 08/24/2019 OT Individual Time: 0802-0900 OT Individual Time Calculation (min): 58 min    Short Term Goals: Week 1:  OT Short Term Goal 1 (Week 1): Pt will complete toilet transfer with LRAD and CGA OT Short Term Goal 2 (Week 1): Pt will complete LB dressing with supervision for sit<stand OT Short Term Goal 3 (Week 1): Pt will complete shower transfer with LRAD and CGA  Skilled Therapeutic Interventions/Progress Updates:    Upon entering the room, pt supine in bed with no c/o pain and agreeable to OT intervention. Pt requesting to wash self at sink and change clothing. Pt initially very impulsive with movement and attempting to get out of bed as therapist present. Pt obtained clothing items while seated on EOB with supervision. Pt ambulating without use of AE and CGA for balance to sink. Pt bathing while standing the majority of time but does not need cuing for safety awareness to sit in order to wash and dress LB safely. Pt's standing balance with CGA for LB clothing management. OT assisted pt with combing out matted hair while discussing discharge plans, possible equipment needs, and orientation questions. Pt remaining in wheelchair at end of session with chair alarm activated and call bell within reach.   Therapy Documentation Precautions:  Precautions Precautions: Fall Precaution Comments: Rt hemi, ataxia Restrictions Weight Bearing Restrictions: No General:   Vital Signs: Therapy Vitals Temp: 98.7 F (37.1 C) Temp Source: Oral Pulse Rate: 93 Resp: 16 BP: (!) 149/82 Patient Position (if appropriate): Sitting Oxygen Therapy SpO2: 100 % O2 Device: Room Air ADL: ADL Eating: Set up Grooming: Contact guard Where Assessed-Grooming: Standing at sink Upper Body Bathing: Setup Where Assessed-Upper Body Bathing: Edge of bed Lower Body Bathing: Contact  guard Where Assessed-Lower Body Bathing: Edge of bed Upper Body Dressing: Supervision/safety Where Assessed-Upper Body Dressing: Edge of bed Lower Body Dressing: Minimal assistance Where Assessed-Lower Body Dressing: Edge of bed Toileting: Not assessed Toilet Transfer: Minimal assistance Toilet Transfer Method: Ambulating(with RW) Psychologist, counselling Transfer: Minimal assistance Film/video editor Method: Ambulating(RW) Astronomer: Transfer tub bench   Therapy/Group: Individual Therapy  Alen Bleacher 08/24/2019, 9:04 AM

## 2019-08-25 ENCOUNTER — Inpatient Hospital Stay (HOSPITAL_COMMUNITY): Payer: Self-pay | Admitting: Physical Therapy

## 2019-08-25 ENCOUNTER — Inpatient Hospital Stay (HOSPITAL_COMMUNITY): Payer: Self-pay | Admitting: Speech Pathology

## 2019-08-25 ENCOUNTER — Inpatient Hospital Stay (HOSPITAL_COMMUNITY): Payer: Self-pay | Admitting: *Deleted

## 2019-08-25 ENCOUNTER — Inpatient Hospital Stay (HOSPITAL_COMMUNITY): Payer: Self-pay | Admitting: Occupational Therapy

## 2019-08-25 DIAGNOSIS — I6340 Cerebral infarction due to embolism of unspecified cerebral artery: Secondary | ICD-10-CM

## 2019-08-25 NOTE — Progress Notes (Signed)
Speech Language Pathology Discharge Summary  Patient Details  Name: Cindy [name] MRN: [id number] Date of Birth: 1961/04/18  Today's Date: 08/26/2019 SLP Individual Time: 0805-0900; 1310-1320 SLP Individual Time Calculation (min): 55 min; 10 min   Skilled Therapeutic Interventions:   Session 1:  Pt was seen for skilled ST targeting goals for cognition and dysphagia.  Pt was eager to begin therapy session and asked to use the bathroom appropriately upon therapist's arrival.  No overt safety concerns noted when ambulating to and from the bathroom with a rolling walker.  SLP re-administered the Cognistat to measure progress from initial evaluation.  Pt's scores still indicate mild-moderate impairment in the areas of functional problem solving and delayed recall; however, individual scores improved slightly on subtests.  SLP reviewed and reinforced recommendations that pt have at least initial assistance for managing medications given ongoing cognitive deficits.  Pt reports husband is willing and able to provide recommended level of assist.  Will reinforce recommendations with husband in scheduled family education this afternoon.  Pt also consumed presentations of her currently prescribed diet with mod I use of swallowing precautions although she did not recall therapist's recommendations for avoiding straws when asked.  Pt was left in recliner with chair alarm set and call bell within reach.  Continue per current plan of care.   Session 2:  Pt was seen for skilled ST to complete education prior to discharge tomorrow.  Pt's husband was not present for therapy session this afternoon.  Pt reports that her husband had stopped by earlier today to visit but had since left and was not planning to return until tomorrow.  SLP provided pt with skilled education regarding memory compensatory strategies and left pt with a handout to maximize carryover in the home environment.  SLP also briefly reviewed swallowing  precautions, emphasizing slow rate and small bites/sips.  All questions were answered to pt's satisfaction at this time.  Pt is ready for discharge tomorrow.     Patient has met 5 of 6 long term goals.  Patient to discharge at overall Min;Modified Independent;Supervision level.  Reasons goals not met: Requires Min A cueing in order to problem solve semi-complex to complex tasks   Clinical Impression/Discharge Summary:   Pt made excellent functional gains and met 5 out of 6 long term goals this admission. Pt currently requires Supervision-Min assist for mildly complex cognitive tasks and will require 24/7 supervision at discharge. Pt is consuming upgraded Regular texture diet with thin liquids and she is Mod I for use of compensatory swallow strategies. Pt has demonstrated improved short term recall with use of compensatory strategies, emergent awareness, and carryover of strategies for basic problem solving. However, given mild cognitive deficits during mildly complex to complex functional tasks still present, recommend pt continue to receive skilled ST services upon discharge. Pt and family education is complete at this time.   Care Partner:  Caregiver Able to Provide Assistance: Yes  Type of Caregiver Assistance: Cognitive  Recommendation:  Home Health SLP;24 hour supervision/assistance  Rationale for SLP Follow Up: Maximize cognitive function and independence;Reduce caregiver burden   Equipment: none   Reasons for discharge: Discharged from hospital   Patient/Family Agrees with Progress Made and Goals Achieved: Yes    Arbutus Leas 08/25/2019, 8:05 PM

## 2019-08-25 NOTE — Progress Notes (Signed)
[facility] PHYSICAL MEDICINE & REHABILITATION PROGRESS NOTE   Subjective/Complaints: Denies pain, SOB, CP. Feels somewhat constipated and agreeable to trying prune juice.  Mildly hypertensive.   ROS: Patient denies CP, SOB, N/V/D  Objective:  No results found. No results for input(s): WBC, HGB, HCT, PLT in the last 72 hours. No results for input(s): NA, K, CL, CO2, GLUCOSE, BUN, CREATININE, CALCIUM in the last 72 hours.  Intake/Output Summary (Last 24 hours) at 08/25/2019 0908 Last data filed at 08/25/2019 0757 Gross per 24 hour  Intake 480 ml  Output --  Net 480 ml     Physical Exam: Vital Signs Blood pressure (!) 147/94, pulse 88, temperature 98.9 F (37.2 C), temperature source Oral, resp. rate 18, height 5\' 3"  (1.6 m), weight 55.3 kg, SpO2 100 %. General: No acute distress. Sitting up in bed comfortably.  Mood and affect are appropriate Heart: Regular rate and rhythm no rubs murmurs or extra sounds Lungs: Clear to auscultation, breathing unlabored, no rales or wheezes Abdomen: Positive bowel sounds, soft nontender to palpation, nondistended Extremities: No clubbing, cyanosis, or edema Skin: No evidence of breakdown, no evidence of rash Neuro:  Eyes without evidence of nystagmus  Tone is normal without evidence of spasticity Cerebellar exam shows no evidence of ataxia on finger nose finger or heel to shin testing No evidence of trunkal ataxia Motor strength is 5/5 in bilateral deltoid, biceps, triceps, finger flexors and extensors, wrist flexors and extensors, hip flexors, knee flexors and extensors, ankle dorsiflexors, plantar flexors, invertors and evertors, toe flexors and extensors Musculoskeletal: Full range of motion in all 4 extremities. No joint swelling Psych: Pt's affect is appropriate. Pt is cooperative   Assessment/Plan: 1. Functional deficits secondary to bilateral cerebral infarcts which require 3+ hours per day of interdisciplinary therapy in a comprehensive  inpatient rehab setting.  Physiatrist is providing close team supervision and 24 hour management of active medical problems listed below.  Physiatrist and rehab team continue to assess barriers to discharge/monitor patient progress toward functional and medical goals  Care Tool:  Bathing    Body parts bathed by patient: Right arm, Left arm, Chest, Abdomen, Front perineal area, Buttocks, Right upper leg, Left upper leg, Left lower leg, Right lower leg, Face         Bathing assist Assist Level: Contact Guard/Touching assist     Upper Body Dressing/Undressing Upper body dressing   What is the patient wearing?: Pull over shirt    Upper body assist Assist Level: Supervision/Verbal cueing    Lower Body Dressing/Undressing Lower body dressing      What is the patient wearing?: Underwear/pull up, Pants     Lower body assist Assist for lower body dressing: Contact Guard/Touching assist     Toileting Toileting Toileting Activity did not occur (Clothing management and hygiene only): N/A (no void or bm)  Toileting assist Assist for toileting: Contact Guard/Touching assist     Transfers Chair/bed transfer  Transfers assist     Chair/bed transfer assist level: Contact Guard/Touching assist     Locomotion Ambulation   Ambulation assist      Assist level: Moderate Assistance - Patient 50 - 74% Assistive device: Lite Gait Max distance: 327ft   Walk 10 feet activity   Assist     Assist level: Contact Guard/Touching assist Assistive device: Walker-rolling   Walk 50 feet activity   Assist Walk 50 feet with 2 turns activity did not occur: Safety/medical concerns  Assist level: Contact Guard/Touching assist Assistive device: Walker-rolling  Walk 150 feet activity   Assist Walk 150 feet activity did not occur: Safety/medical concerns  Assist level: Contact Guard/Touching assist Assistive device: Walker-rolling    Walk 10 feet on uneven surface   activity   Assist Walk 10 feet on uneven surfaces activity did not occur: Safety/medical concerns         Wheelchair     Assist   Type of Wheelchair: Manual    Wheelchair assist level: Supervision/Verbal cueing Max wheelchair distance: 150    Wheelchair 50 feet with 2 turns activity    Assist        Assist Level: Supervision/Verbal cueing   Wheelchair 150 feet activity     Assist      Assist Level: Supervision/Verbal cueing   Blood pressure (!) 147/94, pulse 88, temperature 98.9 F (37.2 C), temperature source Oral, resp. rate 18, height 5\' 3"  (1.6 m), weight 55.3 kg, SpO2 100 %.  Medical Problem List and Plan: 1.Right side weakness with decreased balance and dysphagiasecondary to bilateral cerebral convexity infarct frontal region -patient may  shower -ELOS/Goals: 5-7d Mod I/Sup ADL and mobility   -Continue CIR therapies including PT, OT, and SLP  2. Antithrombotics: -DVT/anticoagulation:SCDs -antiplatelet therapy: ONLY plavix for now 3. Pain Management:Tylenol as needed, well controlled.  4. Mood:Provide emotional support -antipsychotic agentsSeroquel 25 mg nightly 5. Neuropsych: This patientiscapable of making decisions on herown behalf. 6. Skin/Wound Care:Routine skin checks 7. Fluids/Electrolytes/Nutrition:Routine in and outs with follow-up chemistries 8. Dysphagia. soft/thins   -tolerating thus far.   -adv diet per SLP 9. Hypertension. Norvasc 10 mg daily, clonidine 0.1 mg twice daily. at home    Vitals:   08/24/19 1900 08/25/19 0510  BP: 138/77 (!) 147/94  Pulse: 85 88  Resp: 16 18  Temp: 97.8 F (36.6 C) 98.9 F (37.2 C)  SpO2: 100% 100%  resume amlodipine at 5mg  08/24/2019  4/8: mildly hypertensive, continue to monitor as amlodipine was just added yesterday. 10. Seizure prophylaxis. Keppra 500 mg daily 11. Hyperlipidemia. Zocor 12. History of polysubstance  abuse alcohol use. Urine drug screen positive cocaine. Counseling 13. Hematemesis. Upper GI endoscopy 08/16/2019.Barrett's esophagus  Continue Protonix twice daily. 14.  ABLA Hgb stable at 9.4 4/5 15. Disposition: Plan for DC on 4/10.     LOS: 6 days A FACE TO FACE EVALUATION WAS PERFORMED  Murel Shenberger P Arrie Zuercher 08/25/2019, 9:08 AM

## 2019-08-25 NOTE — Progress Notes (Signed)
Speech Language Pathology Daily Session Note  Patient Details  Name: Cindy [name] MRN: [id number] Date of Birth: 07-17-1960  Today's Date: 08/25/2019 SLP Individual Time: 1100-1200 SLP Individual Time Calculation (min): 60 min  Short Term Goals: Week 1: SLP Short Term Goal 1 (Week 1): STG=LTG due to ELOS  Skilled Therapeutic Interventions: Pt was seen for skilled ST targeting cognitive and swallow goals. SLP facilitated session with a familiar complex medication management task in order to assess carryover from yesterday's ST session and strategies for functional problem solving. She organized a BID pill box according to her list of medications with 2 verbal cues for problem solving and only 1 for error awareness. During a novel semi-complex card task, overall Min A was required for recall of rules. However, pt verbally recall functional information such as past appointment times and swallow strategies/recommendations with Supervision A verbal prompts. Pt used schedule to anticipate her day Mod I.  SLP also provided thin H2O to assess tolerance of thin liquids (recently upgraded). She used swallow strategies Mod I and no overt s/sx aspiration were observed throughout intake. Recommend continue current diet.  Pt left sitting in recliner with seat alarm set and needs met to her satisfaction, call bell at side. Continue per current plan of care.        Pain Pain Assessment Pain Scale: 0-10 Pain Score: 0-No pain  Therapy/Group: Individual Therapy  Arbutus Leas 08/25/2019, 7:14 AM

## 2019-08-25 NOTE — Progress Notes (Signed)
Physical Therapy Session Note  Patient Details  Name: Cindy [name] MRN: [id number] Date of Birth: [birth year]  Today's Date: 08/25/2019 PT Individual Time: 6270-3500 PT Individual Time Calculation (min): 55 min   Short Term Goals: Week 1:  PT Short Term Goal 1 (Week 1): STG=LTG due tp ELOS  Skilled Therapeutic Interventions/Progress Updates:    Pt received sitting in recliner and agreeable to PT. Sit>stand form recliner with RW and supervision assist; throughout session, all transfers performed with supervision assist with and without AD. Gait training in hall with RW 2x 123f with supervision assist for safety, mild R LE ataxia noted. Improved control with cues for to posture and improve heel contant.  Dynamic gait training with RW to weave through 6 cones x 2 with supervision assist. Stepping over 6 1 inch obstacles with supervision assist and min-mod cues for technique and safety. Pt performed forward/backward walking without AD 2 x 10 bil each direction with min assist side stepping R and L in parallel bars 2 x 10 ft with CGA for safety. Cues for posture and improved coordination in the RLE to decrease force of heel contact with significant improvement in gait pattern and control on the RLE.   PT instructed pt in modified [place] balance exercise program with hand out provided. BUE throughout: HS curls, HIP abduction, mini-squat, LAQ, each completed x 10 BLE. Tandem standing 2 x 10sec bil, sit<>stand x 5 with push from thighs BLE. Cues for ful ROM and proper UE support throughout as well as decreased speed to improve coordination in RLE  Patient returned to room and left sitting in recliner with call bell in reach and all needs met.           Therapy Documentation Precautions:  Precautions Precautions: Fall Precaution Comments: Rt hemi, ataxia Restrictions Weight Bearing Restrictions: No General: PT Amount of Missed Time (min): 20 Minutes PT Missed Treatment Reason:  Patient fatigue Vital Signs: Therapy Vitals Temp: 98.9 F (37.2 C) Temp Source: Oral Pulse Rate: 94 Resp: 17 BP: (!) 148/85 Patient Position (if appropriate): Sitting Oxygen Therapy SpO2: 100 % O2 Device: Room Air Pain:   denies  Therapy/Group: Individual Therapy  ALorie Phenix4/12/2019, 5:35 PM

## 2019-08-25 NOTE — Discharge Summary (Signed)
Physician Discharge Summary  Patient ID: Cindy [name] MRN: [id number] DOB/AGE: September 16, 1960 59 y.o.  Admit date: 08/19/2019 Discharge date: 08/27/2019  Discharge Diagnoses:  Principal Problem:   Cerebral embolism with cerebral infarction Active Problems:   Ataxia due to recent stroke   Multiple cerebral infarctions (HCC) Hypertension Seizure prophylaxis Hyperlipidemia History of polysubstance abuse and alcohol use Hematemesis  Discharged Condition: Stable  Significant Diagnostic Studies: EEG  Result Date: 08/07/2019 Thana Farr, MD     08/07/2019  8:28 PM ELECTROENCEPHALOGRAM REPORT Patient: Cindy [name]       Room #: [facility] EEG No. ID: [id number] Age: 59 y.o.        Sex: female Requesting Physician: Everardo All Report Date:  08/07/2019       Interpreting Physician: Thana Farr History: Cindy [name] is an 59 y.o. female with seizures Medications: Precedex, Fentanyl, Thiamine, Insulin, Zithromax Conditions of Recording:  This is a 21 channel routine scalp EEG performed with bipolar and monopolar montages arranged in accordance to the international 10/20 system of electrode placement. One channel was dedicated to EKG recording. The patient is in the intubated and sedated state. Description:  The background activity is slow and poorly organized.  It consists of low voltage activity along the delta-theta continuum.  This activity is continuous and diffusely distributed.  There is superimposed fast beta activity noted throughout as well No epileptiform activity is appreciated. Hyperventilation and intermittent photic stimulation were not performed. IMPRESSION: This is an abnormal EEG secondary to general background slowing.  This finding may be seen with a diffuse disturbance that is etiologically nonspecific, but may include a metabolic encephalopathy or post-ictal state, among other possibilities.  The fast beta activity noted likely represents a medication effect.  No epileptiform activity is  noted.  Thana Farr, MD Neurology [phone] 08/07/2019, 8:19 PM   CT Head Wo Contrast  Result Date: 08/05/2019 CLINICAL DATA:  AMS, seizures EXAM: CT HEAD WITHOUT CONTRAST TECHNIQUE: Contiguous axial images were obtained from the base of the skull through the vertex without intravenous contrast. COMPARISON:  05/29/2004 FINDINGS: Brain: No evidence of acute infarction, hemorrhage, hydrocephalus, extra-axial collection or mass lesion/mass effect. Extensive periventricular and deep white matter hypodensity 6, markedly increased compared to remote prior CT dated 2006. Vascular: No hyperdense vessel or unexpected calcification. Skull: Normal. Negative for fracture or focal lesion. Sinuses/Orbits: No acute finding. Other: None. IMPRESSION: 1. No acute intracranial pathology. 2. Extensive periventricular and deep white matter hypodensity markedly increased compared to remote prior CT dated 2006. Findings may reflect small-vessel white matter disease or alternately demyelinating disorder such as multiple sclerosis. Correlate with referable clinical history. Electronically Signed   By: Lauralyn Primes M.D.   On: 08/05/2019 13:18   CT CHEST W CONTRAST  Result Date: 08/06/2019 CLINICAL DATA:  Fall. Cocaine use. Abdominal trauma. EXAM: CT CHEST, ABDOMEN, AND PELVIS WITH CONTRAST TECHNIQUE: Multidetector CT imaging of the chest, abdomen and pelvis was performed following the standard protocol during bolus administration of intravenous contrast. CONTRAST:  OMNIPAQUE IOHEXOL 300 MG/ML  SOLN COMPARISON:  None. FINDINGS: CT CHEST FINDINGS Cardiovascular: Heart size is normal without pericardial effusion. The thoracic aorta is normal in course and caliber without dissection, aneurysm, ulceration or intramural hematoma. Mediastinum/Nodes: No mediastinal hematoma. No mediastinal, hilar or axillary lymphadenopathy. The visualized thyroid and thoracic esophageal course are unremarkable. Lungs/Pleura: No pulmonary  contusion, pneumothorax or pleural effusion. Endotracheal tube tip is below the thoracic inlet but above the carina. Musculoskeletal: No acute fracture of the ribs, sternum  for the visible portions of clavicles and scapulae. CT ABDOMEN PELVIS FINDINGS Hepatobiliary: No hepatic hematoma or laceration. No biliary dilatation. Normal gallbladder. Pancreas: Normal contours without ductal dilatation. No peripancreatic fluid collection. Spleen: No splenic laceration or hematoma. Adrenals/Urinary Tract: --Adrenal glands: No adrenal hemorrhage. --Right kidney/ureter: No hydronephrosis or perinephric hematoma. --Left kidney/ureter: No hydronephrosis or perinephric hematoma. --Urinary bladder: Unremarkable. Stomach/Bowel: --Stomach/Duodenum: No hiatal hernia or other gastric abnormality. Normal duodenal course and caliber. --Small bowel: No dilatation or inflammation. --Colon: No focal abnormality. --Appendix: Not visualized. No right lower quadrant inflammation or free fluid. Vascular/Lymphatic: Normal course and caliber of the major abdominal vessels. No abdominal or pelvic lymphadenopathy. Reproductive: Normal uterus and ovaries. Musculoskeletal. No pelvic fractures. Other: Superficial right lateral abdominal hematoma measures 5.3 x 3.1 cm. IMPRESSION: 1. No acute abnormality of the chest, abdomen or pelvis. 2. Superficial right lateral abdominal wall hematoma. Electronically Signed   By: Deatra Robinson M.D.   On: 08/06/2019 05:15   MR BRAIN WO CONTRAST  Result Date: 08/10/2019 CLINICAL DATA:  Altered mental status. EXAM: MRI HEAD WITHOUT CONTRAST TECHNIQUE: Multiplanar, multiecho pulse sequences of the brain and surrounding structures were obtained without intravenous contrast. COMPARISON:  None. FINDINGS: Brain: Scattered small foci of restricted diffusion are seen within the bilateral cerebral hemispheres, consistent with small acute infarcts, likely embolic. No hemorrhage, hydrocephalus, extra-axial collection or  mass lesion. Scattered confluent foci of T2 hyperintensity are seen within the white matter of the cerebral hemispheres, within the thalami, pons and cerebellum, nonspecific remote lacunar infarcts are seen in the bilateral basal ganglia, bilateral centrum semiovale and corona radiata at the, right middle cerebellar peduncle and left cerebellar hemisphere. Findings are more pronounced than expected for age. Prominence of the cerebral sulci and ventricular system reflecting parenchymal volume loss. Vascular: Normal flow voids. Single A2 from incidentally noted Skull and upper cervical spine: Normal marrow signal. Sinuses/Orbits: Mucosal thickening of the maxillary sinuses, sphenoid sinuses and ethmoid cells. The orbits are maintained. Other: Bilateral mastoid effusions. IMPRESSION: 1. Scattered small foci of restricted diffusion within the bilateral cerebral hemispheres, consistent with small acute infarcts, likely embolic. 2. Advanced white matter disease, more pronounced than expected for age. 3. Mild inflammatory paranasal sinus disease and bilateral mastoid effusions. These results were called by telephone at the time of interpretation on 08/10/2019 at 2:59 pm to provider CHI ELLISON , who verbally acknowledged these results. Electronically Signed   By: Baldemar Lenis M.D.   On: 08/10/2019 15:00   CT ABDOMEN PELVIS W CONTRAST  Result Date: 08/06/2019 CLINICAL DATA:  Fall. Cocaine use. Abdominal trauma. EXAM: CT CHEST, ABDOMEN, AND PELVIS WITH CONTRAST TECHNIQUE: Multidetector CT imaging of the chest, abdomen and pelvis was performed following the standard protocol during bolus administration of intravenous contrast. CONTRAST:  OMNIPAQUE IOHEXOL 300 MG/ML  SOLN COMPARISON:  None. FINDINGS: CT CHEST FINDINGS Cardiovascular: Heart size is normal without pericardial effusion. The thoracic aorta is normal in course and caliber without dissection, aneurysm, ulceration or intramural hematoma.  Mediastinum/Nodes: No mediastinal hematoma. No mediastinal, hilar or axillary lymphadenopathy. The visualized thyroid and thoracic esophageal course are unremarkable. Lungs/Pleura: No pulmonary contusion, pneumothorax or pleural effusion. Endotracheal tube tip is below the thoracic inlet but above the carina. Musculoskeletal: No acute fracture of the ribs, sternum for the visible portions of clavicles and scapulae. CT ABDOMEN PELVIS FINDINGS Hepatobiliary: No hepatic hematoma or laceration. No biliary dilatation. Normal gallbladder. Pancreas: Normal contours without ductal dilatation. No peripancreatic fluid collection. Spleen: No splenic laceration or  hematoma. Adrenals/Urinary Tract: --Adrenal glands: No adrenal hemorrhage. --Right kidney/ureter: No hydronephrosis or perinephric hematoma. --Left kidney/ureter: No hydronephrosis or perinephric hematoma. --Urinary bladder: Unremarkable. Stomach/Bowel: --Stomach/Duodenum: No hiatal hernia or other gastric abnormality. Normal duodenal course and caliber. --Small bowel: No dilatation or inflammation. --Colon: No focal abnormality. --Appendix: Not visualized. No right lower quadrant inflammation or free fluid. Vascular/Lymphatic: Normal course and caliber of the major abdominal vessels. No abdominal or pelvic lymphadenopathy. Reproductive: Normal uterus and ovaries. Musculoskeletal. No pelvic fractures. Other: Superficial right lateral abdominal hematoma measures 5.3 x 3.1 cm. IMPRESSION: 1. No acute abnormality of the chest, abdomen or pelvis. 2. Superficial right lateral abdominal wall hematoma. Electronically Signed   By: Deatra Robinson M.D.   On: 08/06/2019 05:15   DG Chest 1V REPEAT Same Day  Result Date: 08/05/2019 CLINICAL DATA:  Hypoxia EXAM: CHEST - 1 VIEW SAME DAY COMPARISON:  August 05, 2019 study obtained earlier in the day FINDINGS: The endotracheal tube tip is now 1.4 cm above the carina. Nasogastric tube tip and side port in stomach. No pneumothorax.  Lungs are clear. Heart size and pulmonary vascularity are normal. No adenopathy. No bone lesions. IMPRESSION: Tube positions as described without pneumothorax. Note that the endotracheal tube tip is now above the carina. Lungs clear. Heart size normal. Electronically Signed   By: Bretta Bang III M.D.   On: 08/05/2019 13:56   DG CHEST PORT 1 VIEW  Result Date: 08/07/2019 CLINICAL DATA:  Central line placement. EXAM: PORTABLE CHEST 1 VIEW COMPARISON:  August 05, 2019. FINDINGS: Endotracheal and nasogastric tubes are in good position. Interval placement of right internal jugular catheter with distal tip in right atrium. No pneumothorax or pleural effusion is noted. Lungs are clear. Bony thorax is unremarkable. IMPRESSION: Interval placement of right internal jugular catheter with distal tip in right atrium; withdrawal by 2-3 cm is recommended. No pneumothorax is noted. Electronically Signed   By: Lupita Raider M.D.   On: 08/07/2019 10:25   DG Chest Port 1 View  Result Date: 08/05/2019 CLINICAL DATA:  Endotracheal tube position post transport. EXAM: PORTABLE CHEST 1 VIEW COMPARISON:  Earlier on the same date, 13:42 hours FINDINGS: An endotracheal tube tip projects 1.4 cm above the carina, similar to the prior study. An enteric tube none distal lumen continues to loop in the region of the gastric fundus which is decompressed. Lung inflation remain shallow. The cardiomediastinal silhouette remains normal. There is no consolidation, pneumothorax, interstitial edema or obvious pleural effusion. Persistent streaky interstitial opacities in the bilateral infrahilar regions are likely atelectatic. There are bone demineralization and mild degenerative changes. IMPRESSION: Stable ETT placement, its tip projecting 1.4 cm above the carina; a 2 cm retraction could be considered. Similar shallow lung inflation with bilateral infrahilar atelectasis. No pneumonia or pneumothorax. Electronically Signed   By: Laurence Ferrari   On: 08/05/2019 23:17   DG Chest Portable 1 View  Result Date: 08/05/2019 CLINICAL DATA:  Tube placement, post intubation EXAM: PORTABLE CHEST 1 VIEW COMPARISON:  02/13/2012 FINDINGS: Intubation of the right mainstem bronchus. Endotracheal tube is approximately 2 cm beyond the carina into the right mainstem bronchus. Film is rotated. Accounting for this cardiomediastinal contours are normal. Hazy opacity throughout the left chest likely reflects hypoinflation due to right mainstem intubation though subtle areas of airspace opacity are considered. No evidence of pleural effusion. Hyperexpansion of the right chest. Gastric tube in the distal stomach, side port well below GE junction. Tip off the field of the radiograph.  Visualized skeletal structures are unremarkable. IMPRESSION: 1. Intubation of the right mainstem bronchus with hypoinflation of the left chest. Difficult to exclude areas of airspace disease in the left chest. Suggest retraction of the endotracheal tube approximately 3 cm with repeat radiography to assess further. 2. Increased aeration of the right chest related to above. 3. Critical Value/emergent results were called by telephone at the time of interpretation on 08/05/2019 at 12:28 pm to provider Vanetta Mulders , who verbally acknowledged these results. Electronically Signed   By: Donzetta Kohut M.D.   On: 08/05/2019 12:28   DG Abd Portable 1V  Result Date: 08/05/2019 CLINICAL DATA:  Right flank hematoma EXAM: PORTABLE ABDOMEN - 1 VIEW COMPARISON:  None. FINDINGS: NG tube is in the stomach. Nonobstructive bowel gas pattern. No organomegaly or free air. No suspicious calcification. No acute bony abnormality. IMPRESSION: NG tube tip in the distal stomach. No bowel obstruction or free air. Electronically Signed   By: Charlett Nose M.D.   On: 08/05/2019 23:59   DG Swallowing Func-Speech Pathology  Result Date: 08/12/2019 Objective Swallowing Evaluation: Type of Study: MBS-Modified Barium  Swallow Study  Patient Details Name: Cindy [name] MRN: [id number] Date of Birth: 03-Oct-1960 Today's Date: 08/12/2019 Time: SLP Start Time (ACUTE ONLY): 1315 -SLP Stop Time (ACUTE ONLY): 1330 SLP Time Calculation (min) (ACUTE ONLY): 15 min Past Medical History: Past Medical History: Diagnosis Date . Abnormal LFTs   Hx of . CVA (cerebral vascular accident) (HCC) 2005 . Hyperlipidemia  . Hypertension  . Polysubstance dependence including opioid type drug, episodic abuse (HCC)   cocaine/etoh  Past Surgical History: No past surgical history on file. HPI: 59 y/o F who presented to [facility]/19 with reports of altered mental status. Active seizures on arrival to ER.  Hx of polysubstance abuse. Intubated from 3/19-3/22.  No data recorded Assessment / Plan / Recommendation CHL IP CLINICAL IMPRESSIONS 08/12/2019 Clinical Impression MBS completed with pt exhibitng mild oropharyngeal deficits leading to penetration with thin liquids due to incomplete laryngeal closure. Questionable trace amount of barium reaching vocal cords versus higher in vestibule (very faint). Decreased cohesion with lingual residue in right buccal cavity. Swallow was slower to trigger with barium reaching valleculae and once pyriform sinsues before laryngeal protection completed. Recommend initiation of Dys 1, nectar thick liquids, straws allowed, aks pt to clear throat intermittently, full assist for feeding and swallow strategies. ST will continue to treat.     SLP Visit Diagnosis Dysphagia, oropharyngeal phase (R13.12) Attention and concentration deficit following -- Frontal lobe and executive function deficit following -- Impact on safety and function Mild aspiration risk   CHL IP TREATMENT RECOMMENDATION 08/12/2019 Treatment Recommendations Therapy as outlined in treatment plan below   Prognosis 08/12/2019 Prognosis for Safe Diet Advancement Fair Barriers to Reach Goals -- Barriers/Prognosis Comment -- CHL IP DIET RECOMMENDATION 08/12/2019 SLP Diet  Recommendations Dysphagia 1 (Puree) solids;Nectar thick liquid Liquid Administration via Cup;No straw;Straw Medication Administration Crushed with puree Compensations Minimize environmental distractions;Slow rate;Small sips/bites;Clear throat intermittently Postural Changes Seated upright at 90 degrees   CHL IP OTHER RECOMMENDATIONS 08/12/2019 Recommended Consults -- Oral Care Recommendations Oral care BID Other Recommendations --   CHL IP FOLLOW UP RECOMMENDATIONS 08/12/2019 Follow up Recommendations Skilled Nursing facility   [facility] IP FREQUENCY AND DURATION 08/12/2019 Speech Therapy Frequency (ACUTE ONLY) min 2x/week Treatment Duration 2 weeks      CHL IP ORAL PHASE 08/12/2019 Oral Phase Impaired Oral - Pudding Teaspoon -- Oral - Pudding Cup -- Oral - Honey Teaspoon --  Oral - Honey Cup WFL Oral - Nectar Teaspoon -- Oral - Nectar Cup Decreased bolus cohesion;Right anterior bolus loss Oral - Nectar Straw Decreased bolus cohesion Oral - Thin Teaspoon -- Oral - Thin Cup Decreased bolus cohesion;Delayed oral transit Oral - Thin Straw WFL Oral - Puree Delayed oral transit;Lingual/palatal residue Oral - Mech Soft -- Oral - Regular -- Oral - Multi-Consistency -- Oral - Pill -- Oral Phase - Comment --  CHL IP PHARYNGEAL PHASE 08/12/2019 Pharyngeal Phase Impaired Pharyngeal- Pudding Teaspoon -- Pharyngeal -- Pharyngeal- Pudding Cup -- Pharyngeal -- Pharyngeal- Honey Teaspoon -- Pharyngeal -- Pharyngeal- Honey Cup Delayed swallow initiation-vallecula Pharyngeal -- Pharyngeal- Nectar Teaspoon -- Pharyngeal -- Pharyngeal- Nectar Cup Delayed swallow initiation-vallecula Pharyngeal -- Pharyngeal- Nectar Straw WFL Pharyngeal -- Pharyngeal- Thin Teaspoon -- Pharyngeal -- Pharyngeal- Thin Cup Delayed swallow initiation-pyriform sinuses;Delayed swallow initiation-vallecula Pharyngeal -- Pharyngeal- Thin Straw Penetration/Aspiration during swallow Pharyngeal Material enters airway, CONTACTS cords and then ejected out;Material enters airway,  CONTACTS cords and not ejected out Pharyngeal- Puree WFL Pharyngeal -- Pharyngeal- Mechanical Soft -- Pharyngeal -- Pharyngeal- Regular -- Pharyngeal -- Pharyngeal- Multi-consistency -- Pharyngeal -- Pharyngeal- Pill -- Pharyngeal -- Pharyngeal Comment --  CHL IP CERVICAL ESOPHAGEAL PHASE 08/12/2019 Cervical Esophageal Phase WFL Pudding Teaspoon -- Pudding Cup -- Honey Teaspoon -- Honey Cup -- Nectar Teaspoon -- Nectar Cup -- Nectar Straw -- Thin Teaspoon -- Thin Cup -- Thin Straw -- Puree -- Mechanical Soft -- Regular -- Multi-consistency -- Pill -- Cervical Esophageal Comment -- Hadia, Minier 08/12/2019, 3:23 PM              ECHOCARDIOGRAM COMPLETE  Result Date: 08/06/2019    ECHOCARDIOGRAM REPORT   Patient Name:   VESTER TITSWORTH Date of Exam: 08/06/2019 Medical Rec #:  [id number]    Height:       64.0 in Accession #:    [id number]   Weight:       130.5 lb Date of Birth:  20-Dec-1960   BSA:          1.632 m Patient Age:    58 years     BP:           115/73 mmHg Patient Gender: F            HR:           55 bpm. Exam Location:  Inpatient Procedure: 2D Echo, Color Doppler and Cardiac Doppler Indications:    Elevated BNP  History:        Patient has no prior history of Echocardiogram examinations.                 Respiratory failure.  Sonographer:    Roosvelt Maser RDCS Referring Phys: [id number] Ivor Costa MEIER IMPRESSIONS  1. Left ventricular ejection fraction, by estimation, is 60 to 65%. The left ventricle has normal function. The left ventricle has no regional wall motion abnormalities. There is mild left ventricular hypertrophy. Left ventricular diastolic parameters are consistent with Grade I diastolic dysfunction (impaired relaxation).  2. Right ventricular systolic function is normal. The right ventricular size is normal.  3. The mitral valve is grossly normal. Mild mitral valve regurgitation.  4. The aortic valve is tricuspid. Aortic valve regurgitation is not visualized. FINDINGS  Left Ventricle: Left  ventricular ejection fraction, by estimation, is 60 to 65%. The left ventricle has normal function. The left ventricle has no regional wall motion abnormalities. The left ventricular internal cavity size was normal in size. There is  mild left ventricular hypertrophy. Left ventricular diastolic parameters are consistent with Grade I diastolic dysfunction (impaired relaxation). Right Ventricle: The right ventricular size is normal. No increase in right ventricular wall thickness. Right ventricular systolic function is normal. Left Atrium: Left atrial size was normal in size. Right Atrium: Right atrial size was normal in size. Pericardium: There is no evidence of pericardial effusion. Presence of pericardial fat pad. Mitral Valve: The mitral valve is grossly normal. Mild mitral valve regurgitation. Tricuspid Valve: The tricuspid valve is grossly normal. Tricuspid valve regurgitation is trivial. Aortic Valve: The aortic valve is tricuspid. Aortic valve regurgitation is not visualized. Mild aortic valve annular calcification. Pulmonic Valve: The pulmonic valve was grossly normal. Pulmonic valve regurgitation is trivial. Aorta: The aortic root is normal in size and structure. Venous: IVC assessment for right atrial pressure unable to be performed due to mechanical ventilation. IAS/Shunts: No atrial level shunt detected by color flow Doppler.  LEFT VENTRICLE PLAX 2D LVIDd:         3.20 cm     Diastology LVIDs:         2.10 cm     LV e' lateral:   6.74 cm/s LV PW:         1.20 cm     LV E/e' lateral: 9.7 LV IVS:        1.20 cm     LV e' medial:    3.57 cm/s LVOT diam:     1.90 cm     LV E/e' medial:  18.4 LV SV:         72 LV SV Index:   44 LVOT Area:     2.84 cm  LV Volumes (MOD) LV vol d, MOD A4C: 45.5 ml LV vol s, MOD A4C: 18.4 ml LV SV MOD A4C:     45.5 ml RIGHT VENTRICLE             IVC RV S prime:     10.10 cm/s  IVC diam: 1.80 cm TAPSE (M-mode): 1.2 cm LEFT ATRIUM           Index       RIGHT ATRIUM           Index  LA diam:      2.10 cm 1.29 cm/m  RA Area:     10.70 cm LA Vol (A2C): 22.5 ml 13.79 ml/m RA Volume:   20.80 ml  12.75 ml/m LA Vol (A4C): 43.7 ml 26.78 ml/m  AORTIC VALVE LVOT Vmax:   91.40 cm/s LVOT Vmean:  72.400 cm/s LVOT VTI:    0.254 m  AORTA Ao Root diam: 3.00 cm Ao Asc diam:  3.10 cm MITRAL VALVE               TRICUSPID VALVE MV Area (PHT): 2.73 cm    TR Peak grad:   13.4 mmHg MV Decel Time: 278 msec    TR Vmax:        183.00 cm/s MV E velocity: 65.55 cm/s MV A velocity: 73.30 cm/s  SHUNTS MV E/A ratio:  0.89        Systemic VTI:  0.25 m                            Systemic Diam: 1.90 cm Nona DellSamuel Mcdowell MD Electronically signed by Nona DellSamuel Mcdowell MD Signature Date/Time: 08/06/2019/4:15:48 PM    Final    VAS US CAROTID  Result Date: 08/11/2019 Carotid Arterial  Duplex Study Indications:       Altered mental status, active seizures and history of                    polysubstance abuse. Risk Factors:      Prior CVA. Comparison Study:  No priors. Performing Technologist: Oda Cogan RDMS, RVT  Examination Guidelines: A complete evaluation includes B-mode imaging, spectral Doppler, color Doppler, and power Doppler as needed of all accessible portions of each vessel. Bilateral testing is considered an integral part of a complete examination. Limited examinations for reoccurring indications may be performed as noted.  Right Carotid Findings: +----------+--------+--------+--------+------------------+------------------+           PSV cm/sEDV cm/sStenosisPlaque DescriptionComments           +----------+--------+--------+--------+------------------+------------------+ CCA Prox  85      18                                                   +----------+--------+--------+--------+------------------+------------------+ CCA Distal59      17                                                   +----------+--------+--------+--------+------------------+------------------+ ICA Prox  57      12       1-39%                     intimal thickening +----------+--------+--------+--------+------------------+------------------+ ICA Distal65      22                                                   +----------+--------+--------+--------+------------------+------------------+ ECA       64      10                                                   +----------+--------+--------+--------+------------------+------------------+ +----------+--------+-------+----------------+-------------------+           PSV cm/sEDV cmsDescribe        Arm Pressure (mmHG) +----------+--------+-------+----------------+-------------------+ [id number]            Multiphasic, WNL                    +----------+--------+-------+----------------+-------------------+ +---------+--------+--+--------+--+---------+ VertebralPSV cm/s66EDV cm/s19Antegrade +---------+--------+--+--------+--+---------+  Left Carotid Findings: +----------+--------+--------+--------+------------------+------------------+           PSV cm/sEDV cm/sStenosisPlaque DescriptionComments           +----------+--------+--------+--------+------------------+------------------+ CCA Prox  90      16                                                   +----------+--------+--------+--------+------------------+------------------+ CCA Distal57      16                                                   +----------+--------+--------+--------+------------------+------------------+  ICA Prox  44      15      1-39%                     intimal thickening +----------+--------+--------+--------+------------------+------------------+ ICA Distal62      24                                                   +----------+--------+--------+--------+------------------+------------------+ ECA       57      11                                                   +----------+--------+--------+--------+------------------+------------------+  +----------+--------+--------+----------------+-------------------+           PSV cm/sEDV cm/sDescribe        Arm Pressure (mmHG) +----------+--------+--------+----------------+-------------------+ [id number]             Multiphasic, WNL                    +----------+--------+--------+----------------+-------------------+ +---------+--------+--+--------+--+---------+ VertebralPSV cm/s49EDV cm/s11Antegrade +---------+--------+--+--------+--+---------+   Summary: Right Carotid: Velocities in the right ICA are consistent with a 1-39% stenosis. Left Carotid: Velocities in the left ICA are consistent with a 1-39% stenosis. Vertebrals:  Bilateral vertebral arteries demonstrate antegrade flow. Subclavians: Normal flow hemodynamics were seen in bilateral subclavian              arteries. *See table(s) above for measurements and observations.  Electronically signed by Lemar Livings MD on 08/11/2019 at 4:37:38 PM.    Final    VAS [place] LOWER EXTREMITY VENOUS (DVT)  Result Date: 08/11/2019  Lower Venous DVTStudy Indications: Embolic stroke, active seizures and hx of polysubstance abuse.  Comparison Study: No priors. Performing Technologist: Marilynne Halsted RDMS, RVT  Examination Guidelines: A complete evaluation includes B-mode imaging, spectral Doppler, color Doppler, and power Doppler as needed of all accessible portions of each vessel. Bilateral testing is considered an integral part of a complete examination. Limited examinations for reoccurring indications may be performed as noted. The reflux portion of the exam is performed with the patient in reverse Trendelenburg.  +---------+---------------+---------+-----------+----------+--------------+ RIGHT    CompressibilityPhasicitySpontaneityPropertiesThrombus Aging +---------+---------------+---------+-----------+----------+--------------+ CFV      Full           Yes      Yes                                  +---------+---------------+---------+-----------+----------+--------------+ SFJ      Full                                                        +---------+---------------+---------+-----------+----------+--------------+ FV Prox  Full                                                        +---------+---------------+---------+-----------+----------+--------------+  FV Mid   Full                                                        +---------+---------------+---------+-----------+----------+--------------+ FV DistalFull                                                        +---------+---------------+---------+-----------+----------+--------------+ PFV      Full                                                        +---------+---------------+---------+-----------+----------+--------------+ POP      Full           Yes      Yes                                 +---------+---------------+---------+-----------+----------+--------------+ PTV      Full                                                        +---------+---------------+---------+-----------+----------+--------------+ PERO     Full                                                        +---------+---------------+---------+-----------+----------+--------------+   +---------+---------------+---------+-----------+----------+--------------+ LEFT     CompressibilityPhasicitySpontaneityPropertiesThrombus Aging +---------+---------------+---------+-----------+----------+--------------+ CFV      Full           Yes      Yes                                 +---------+---------------+---------+-----------+----------+--------------+ SFJ      Full                                                        +---------+---------------+---------+-----------+----------+--------------+ FV Prox  Full                                                         +---------+---------------+---------+-----------+----------+--------------+ FV Mid   Full                                                        +---------+---------------+---------+-----------+----------+--------------+  FV DistalFull                                                        +---------+---------------+---------+-----------+----------+--------------+ PFV      Full                                                        +---------+---------------+---------+-----------+----------+--------------+ POP      Full           Yes      Yes                                 +---------+---------------+---------+-----------+----------+--------------+ PTV      Full                                                        +---------+---------------+---------+-----------+----------+--------------+ PERO     Full                                                        +---------+---------------+---------+-----------+----------+--------------+     *See table(s) above for measurements and observations. Electronically signed by Lemar Livings MD on 08/11/2019 at 4:37:29 PM.    Final    VAS [place] TRANSCRANIAL DOPPLER  Result Date: 08/16/2019  Transcranial Doppler Indications: Stroke. Limitations for diagnostic windows: Unable to insonate right transtemporal window. Unable to insonate left transtemporal window. Comparison Study: No prior studies. Performing Technologist: Chanda Busing RVT  Examination Guidelines: A complete evaluation includes B-mode imaging, spectral Doppler, color Doppler, and power Doppler as needed of all accessible portions of each vessel. Bilateral testing is considered an integral part of a complete examination. Limited examinations for reoccurring indications may be performed as noted.  +----------+-------------+----------+-----------+-------+ RIGHT TCD Right VM (cm)Depth (cm)PulsatilityComment +----------+-------------+----------+-----------+-------+  Opthalmic     23.00                 1.55            +----------+-------------+----------+-----------+-------+ ICA siphon    21.00                 1.48            +----------+-------------+----------+-----------+-------+ Vertebral    -22.00                 1.41            +----------+-------------+----------+-----------+-------+ Distal ICA    64.00                 0.72            +----------+-------------+----------+-----------+-------+  +----------+------------+----------+-----------+-------+ LEFT TCD  Left VM (cm)Depth (cm)PulsatilityComment +----------+------------+----------+-----------+-------+ Opthalmic    25.00                 1.51            +----------+------------+----------+-----------+-------+  ICA siphon   20.00                 1.46            +----------+------------+----------+-----------+-------+ Vertebral    -12.00                1.36            +----------+------------+----------+-----------+-------+ Distal ICA   19.00                 0.77            +----------+------------+----------+-----------+-------+  +------------+-------+-------+             VM cm/sComment +------------+-------+-------+ Prox Basilar-25.00  1.12   +------------+-------+-------+ Dist Basilar-21.00  1.18   +------------+-------+-------+ Summary:  Absent bitemporal windows limits exam. Normal mean flow velocities in both opthalmics,carotid siphons,and vertebral and basilar arteries. *See table(s) above for TCD measurements and observations.  Diagnosing physician: Delia Heady MD Electronically signed by Delia Heady MD on 08/16/2019 at 12:38:03 PM.    Final     Labs:  Basic Metabolic Panel: Recent Labs  Lab 08/19/19 0535 08/22/19 0501  NA 135 139  K 3.3* 3.5  CL 101 106  CO2 22 24  GLUCOSE 101* 100*  BUN 23* 12  CREATININE 1.14* 1.18*  CALCIUM 9.1 9.1  MG 1.6*  --     CBC: Recent Labs  Lab 08/19/19 0535 08/22/19 0501  WBC 8.4 5.1  NEUTROABS   --  2.9  HGB 9.4* 9.4*  HCT 27.9* 28.5*  MCV 94.3 96.9  PLT 448* 609*    CBG: Recent Labs  Lab 08/19/19 0801 08/19/19 1135  GLUCAP 123* 108*   Family history.  Father with prostate cancer.  Mother with hypertension and asthma.  Denies any hyperlipidemia diabetes mellitus colon cancer or rectal cancer  Brief HPI:   Cindy [name] is a 59 y.o. right-handed female with history of polysubstance abuse, hypertension, CVA 2005, hyperlipidemia.  Per chart review lives with spouse independent prior to admission.  1 level home one-step to entry.  Presented 08/05/2019 to [facility] hospital with altered mental status and actively seizing.  She was noted to have eye deviation to the right with twitching of the face.  She was treated with IV Ativan and loaded with Keppra.  Alcohol level 21, BNP 121, urine drug screen positive for cocaine, lactic acid 2.7, urinalysis negative nitrite, sodium 124, creatinine 1.05.  She did require intubation for airway protection.  Cranial CT scan negative for acute changes.  Extensive periventricular and deep white matter hypodensity markedly increased compared to remote prior CT dated 2006.  Patient did not receive TPA.  She was transferred to [facility] for further evaluation.  Echocardiogram with ejection fraction of 60% grade 1 diastolic dysfunction.  EEG negative for seizure.  Carotid Dopplers negative for ICA stenosis.  Follow-up MRI showed scattered small foci of restricted diffusion within the bilateral cerebral hemispheres consistent with small acute infarction likely embolic.  Maintained on aspirin and Plavix for CVA prophylaxis initially.  Subcutaneous Lovenox for DVT prophylaxis.  Gastroenterology services Dr. Ewing Schlein consulted 08/16/2019 for hematemeisi/anemia with hemoglobin dipping to 7.1 transfuse 1 unit packed red blood cells.  Patient underwent upper GI endoscopy 08/16/2019 showing medium size hiatal hernia present.  There was esophageal mucosal changes  suspicious for short segment Barrett's esophagus present in the lower third of the esophagus.  1 cratered small distal esophageal ulcer with stigmata of recent bleeding found.  The entire examined stomach was normal and patient was placed on Protonix twice daily.  After initially being placed on aspirin and Plavix held only Plavix resumed due to bleeding episodes.  Lovenox has since been discontinued.  Hemoglobin remained stable at 8.8.  She is on a dysphagia #1 nectar thick liquid diet.  Therapy evaluations completed and patient was admitted for a comprehensive rehab program   Hospital Course: AMELIYAH SARNO was admitted to rehab 08/19/2019 for inpatient therapies to consist of PT, ST and OT at least three hours five days a week. Past admission physiatrist, therapy team and rehab RN have worked together to provide customized collaborative inpatient rehab.  Pertaining to patient's bilateral cerebral convexity infarct frontal region remained stable maintained on only Plavix therapy due to recent hematemesis and Lovenox since discontinued.  She would follow-up neurology services.  Hemoglobin hematocrit main stable she would follow-up with GI services and would remain on Protonix twice daily for now.  Her diet was advanced to a regular consistency.  Mood stabilization with low-dose Seroquel and good results.  She continued on Zocor for hyperlipidemia.  Blood pressures controlled on Norvasc 5 mg daily and titrated to 10 mg with no orthostasis.  Keppra ongoing for seizure prophylaxis EEG negative.  Patient did have a long history of alcohol polysubstance use she received counsel regards to cessation of illicit drug products alcohol as well as tobacco as well as follow-up with neuropsychology.   Blood pressures were monitored on TID basis and controlled   She is continent of bowel and bladder.  She has made gains during rehab stay and is attending therapies  She will continue to receive follow up therapies    after discharge  Rehab course: During patient's stay in rehab weekly team conferences were held to monitor patient's progress, set goals and discuss barriers to discharge. At admission, patient required min mod assist for sit to stand, total assist stand pivot transfers, moderate assist sit to supine.  Supervision eating, max assist toileting min mod assist to maintain balance for ADLs  Physical exam.  Blood pressure 122/76 pulse 97 temperature 98.2 respirations 18 oxygen saturation 98% room air Neurologic.  Alert follow commands oriented to self but some delay in processing for a year and date HEENT Head.  Normocephalic and atraumatic Neck.  Supple nontender no JVD without thyromegaly Cardiac regular rate rhythm without any extra sounds or murmur heard Abdomen.  Soft nontender positive bowel sounds without rebound Respiratory effort normal no respiratory distress without wheeze Skin.  No evidence of breakdown or rash Neurological/musculoskeletal.  Cranial nerves II through XII intact motor strength 5 out of 5 bilateral deltoids, bicep, tricep, grip, hip flexors, knee extensors, ankle dorsiflexion plantarflexion.  Sensation exam normal to light touch.  Cerebellum exam dysmetria mild to moderate right and normal left finger-to-nose as well as heel-to-shin.  Francis Dowse  has had improvement in activity tolerance, balance, postural control as well as ability to compensate for deficits. Francis Dowse has had improvement in functional use RUE/LUE  and RLE/LLE as well as improvement in awareness.  Performed bed mobility supervision and don shoes with set up.  Ambulates to the Ortho gym contact-guard assist with rolling walker.  Working with energy conservation techniques.  Ambulates up to 150 feet x 2 with rest breaks.  Participated in ambulation outside on uneven surfaces 100 feet x 3.  Patient able to perform with contact-guard assist overall.  She was somewhat impulsive with ADLs again discussed with family need for  supervision for  safety.  Obtain clothing items while seated edge of bed supervision.  Ambulates without use of assistance to the sink.  Patient bathed while standing the majority of time but does not not need cueing.  Patient completed a complex medication management task in which she organized a twice daily pillbox according to her list of current medications with initial demonstration cues to determine difference of medications.  It was recommended the need for family to assist as needed.  Full family teaching was completed plan discharge to home       Disposition: Discharge to home    Diet: Regular  Special Instructions: No driving smoking alcohol or illicit drug use  Medications at discharge 1.  Norvasc 10 mg p.o. daily 2.  Plavix 75 mg p.o. daily 3.  Keppra 500 mg p.o. daily 4.  Protonix 40 mg p.o. twice daily 5.  Seroquel 25 mg p.o. nightly 6.  Zocor 20 mg p.o. daily  Discharge Instructions    Ambulatory referral to Neurology   Complete by: As directed    An appointment is requested in approximately 4 weeks bilateral cerebral convexity infarction frontal region   Ambulatory referral to Physical Medicine Rehab   Complete by: As directed    Moderate complexity follow-up 1 to 2 weeks bilateral cerebral convexity inferior frontal region infarction      Follow-up Information    Kirsteins, Victorino Sparrow, MD Follow up.   Specialty: Physical Medicine and Rehabilitation Why: Office to call for appointment Contact information: [address] [place] [address] [phone]        Vida Rigger, MD Follow up.   Specialty: Gastroenterology Why: Call for appointment Contact information: [address] [place] [address] [phone]           Signed: Mcarthur Rossetti Angiulli 08/26/2019, 5:03 AM

## 2019-08-25 NOTE — Progress Notes (Signed)
Occupational Therapy Session Note  Patient Details  Name: Cindy [name] MRN: [id number] Date of Birth: 1961-05-09  Today's Date: 08/25/2019 OT Individual Time: 0901-1000 OT Individual Time Calculation (min): 59 min    Short Term Goals: Week 1:  OT Short Term Goal 1 (Week 1): Pt will complete toilet transfer with LRAD and CGA OT Short Term Goal 2 (Week 1): Pt will complete LB dressing with supervision for sit<stand OT Short Term Goal 3 (Week 1): Pt will complete shower transfer with LRAD and CGA  Skilled Therapeutic Interventions/Progress Updates:    Upon entering the room, pt supine in bed and agreeable to OT intervention with plans to shower. Pt with mid impulsivity this session and needing min cuing for safety awareness this session. Pt ambulating initially in room without use of RW secondary to tight space to obtain clothing items. Pt required close supervision to ambulating to bathroom for toileting needing. Min guard needed for balance during LB clothing management and hygiene after BM. Pt transitioned into shower and needing min cuing to remain seated for safety with task. Pt returning to sit on EOB for dressing tasks. Min guard needed for balance with pt utilizing B UE in dressing tasks. After dressed, pt reports," I think I have to use the bathroom again." Pt returning back to toilet in same manner as above and does have another BM. Pt returning to sit in recliner chair at end of session with chair alarm activated and call bell within reach.   Therapy Documentation Precautions:  Precautions Precautions: Fall Precaution Comments: Rt hemi, ataxia Restrictions Weight Bearing Restrictions: No  Pain: Pain Assessment Pain Scale: 0-10 Pain Score: 0-No pain ADL: ADL Eating: Set up Grooming: Contact guard Where Assessed-Grooming: Standing at sink Upper Body Bathing: Setup Where Assessed-Upper Body Bathing: Edge of bed Lower Body Bathing: Contact guard Where Assessed-Lower Body  Bathing: Edge of bed Upper Body Dressing: Supervision/safety Where Assessed-Upper Body Dressing: Edge of bed Lower Body Dressing: Minimal assistance Where Assessed-Lower Body Dressing: Edge of bed Toileting: Not assessed Toilet Transfer: Minimal assistance Toilet Transfer Method: Ambulating(with RW) Psychologist, counselling Transfer: Minimal assistance Film/video editor Method: Ambulating(RW) Astronomer: Transfer tub bench   Therapy/Group: Individual Therapy  Alen Bleacher 08/25/2019, 12:34 PM

## 2019-08-25 NOTE — Discharge Instructions (Signed)
Inpatient Rehab Discharge Instructions  NASTASSIA BAZALDUA Discharge date and time: No discharge date for patient encounter.   Activities/Precautions/ Functional Status: Activity: activity as tolerated Diet: regular Wound Care: none needed Functional status:  ___ No restrictions     ___ Walk up steps independently ___ 24/7 supervision/assistance   ___ Walk up steps with assistance ___ Intermittent supervision/assistance  ___ Bathe/dress independently ___ Walk with walker     _x__ Bathe/dress with assistance ___ Walk Independently    ___ Shower independently ___ Walk with assistance    ___ Shower with assistance ___ No alcohol     ___ Return to work/school ________  COMMUNITY REFERRALS UPON DISCHARGE:  Home Health: PT, OT, ST  Agency:[facility] [phone]  Medical Equipment/Items Ordered:RW,  TTB  Agency/Supplier:[facility]  GENERAL COMMUNITY RESOURCES FOR PATIENT/FAMILY:  Support Groups:Substance Abuse Counseling Community Resources  Special Instructions: No driving smoking or alcohol   My questions have been answered and I understand these instructions. I will adhere to these goals and the provided educational materials after my discharge from the hospital.  Patient/Caregiver Signature _______________________________ Date __________  Clinician Signature _______________________________________ Date __________  Please bring this form and your medication list with you to all your follow-up doctor's appointments.

## 2019-08-25 NOTE — Progress Notes (Signed)
Team Conference Report to Patient/Family  Team Conference discussion was reviewed with the patient, including goals, any changes in plan of care and target discharge date.  Patient expressed understanding and is  in agreement.  The patient has a target discharge date of 08/27/19. HH services set up with [facility]; noted that if the [facility] staff find the patient to be under the influence of cocaine, alcohol or any other illegal substances, they will discontinue services. Patient stated an understanding of the information provided. The patient was also given information about substance abuse counseling resources in the community. Family education set up for 08/26/19 @ 1p.m. Chana Bode B 08/25/2019, 11:59 AM

## 2019-08-25 NOTE — Evaluation (Signed)
Recreational Therapy Assessment and Plan  Patient Details  Name: Cindy [name] MRN: [id number] Date of Birth: Sep 15, 1960 Today's Date: 08/25/2019  Rehab Potential:  Good ELOS:   discharge 4/10  Assessment   Problem List:      Patient Active Problem List   Diagnosis Date Noted  . 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 Multiple cerebral infarctions ([place]) 08/19/2019  . Cerebral embolism with cerebral infarction 08/13/2019  . Status epilepticus ([place]) 08/05/2019  . Acute encephalopathy 08/05/2019  . TRANSAMINASES, SERUM, ELEVATED 10/23/2009  . HYPERCHOLESTEROLEMIA 07/03/2008  . ALCOHOL USE 07/03/2008  . Essential hypertension 07/03/2008  . STROKE 07/03/2008  . DYSPHAGIA UNSPECIFIED 07/03/2008  . DIABETES MELLITUS, BORDERLINE 07/03/2008  . Pain in joint, shoulder region 06/22/2008  . SPONDYLOSIS 06/22/2008  . CERVICALGIA 06/22/2008  . SPINAL STENOSIS 06/22/2008  . CERVICAL SPASM 06/22/2008    Past Medical History:      Past Medical History:  Diagnosis Date  . Abnormal LFTs    Hx of  . CVA (cerebral vascular accident) ([place]) 2005  . Hyperlipidemia   . Hypertension   . Polysubstance dependence including opioid type drug, episodic abuse ([place])    cocaine/etoh    Past Surgical History:       Past Surgical History:  Procedure Laterality Date  . ESOPHAGOGASTRODUODENOSCOPY N/A 08/16/2019   Procedure: ESOPHAGOGASTRODUODENOSCOPY (EGD);  Surgeon: Clarene Essex, MD;  Location: [facility];  Service: Endoscopy;  Laterality: N/A;    Assessment & Plan Clinical Impression: Patient is a 59 year old right-handed female with history of polysubstance abuse, hypertension, CVA 2005, hyperlipidemia. Per chart review lives with spouse and independent prior to admission. 1 level home one-step to entry. Presented 08/05/2019 to [facility] hospital with altered mental status and actively seizing. She was noted to have eye deviation to the right with twitching of the face. She was  treated with IV Ativan and loaded with Keppra. Alcohol level 21, BNP 121, urine drug screen positive cocaine, lactic acid 2.7, urinalysis negative nitrate, sodium 124, creatinine 1.05, hemoglobin 14.9. She did require intubation for airway protection. Cranial CT scan negative for acute changes. Extensive periventricular and deep white matter hypodensity markedly increased compared to remote prior CT dated 2006. Patient did not receive TPA. She was transferred to [facility] for further evaluation. Echocardiogram with ejection fraction of 09% grade 1 diastolic dysfunction. EEG negative for seizure. Carotid Dopplers negative for ICA stenosis. Follow-up MRI showed scattered small foci of restricted diffusion within the bilateral cerebral hemispheres consistent with small acute infarcts likely embolic. Maintain on aspirin and Plavix x3 weeks followed by aspirin alone for CVA prophylaxis. Subcutaneous Lovenox for DVT prophylaxis.Gastroenterology services Dr. Watt Climes consulted 08/16/2019 for Hematemesis/anemiawith hemoglobin dipping to 7.1 transfuse 1 unit packed red blood cells. Patient underwentupper GI endoscopy 08/16/2019 showing medium size hiatal hernia present. There was esophageal mucosal changes suspicious for short segment Barrett's esophagus present in the lower third of the esophagus. 1 cratered small distal esophageal ulcer with stigmata of recent bleeding found. The entire examined stomach was normal and patient was placed on Protonix twice daily. Patient transferred to CIR on 08/19/2019 .   Pt presents with decreased activity tolerance, decreased functional mobility, decreased balance, decreased coordination, decreased attention, decreased problem solving, decreased safety awareness Limiting pt's independence with leisure/community pursuits.    Plan Min 1 TR session >20 minutes for community reintegraiton  Recommendations for other services: None   Discharge Criteria:  Patient will be discharged from TR if patient refuses treatment 3 consecutive  times without medical reason.  If treatment goals not met, if there is a change in medical status, if patient makes no progress towards goals or if patient is discharged from hospital.  The above assessment, treatment plan, treatment alternatives and goals were discussed and mutually agreed upon: by patient  [place] 08/25/2019, 4:30 PM

## 2019-08-25 NOTE — Care Management (Signed)
   The overall goal for the admission was met for:   Discharge location: Home with spouse and her sister  Length of Stay: 8 days with discharge 08/27/19  Discharge activity level:  Patient to discharge at an ambulatory level Supervision..   Home/community participation:Limited Participation  Services provided included: MD, RD, PT, OT, SLP, RN, CM, TR, Pharmacy, Neuropsych and SW  Financial Services: Other: Uninsured  Follow-up services arranged: Home Health: PT, OT, SLP, DME: RW, TTB and Patient/Family has no preference for HH/DME agencies  Comments (or additional information): [facility] [phone]  MATCH Medication Assistance Card given to patient. Medications to be picked up at [facility] [address], [place], [place]  Patient/Family verbalized understanding of follow-up arrangements: Yes  Family Education 08/26/2019  Individual responsible for coordination of the follow-up plan: [place] [phone]  Confirmed correct DME delivered: ,  B 08/25/2019    ,  B 

## 2019-08-25 NOTE — Plan of Care (Signed)
  Problem: Consults Goal: RH STROKE PATIENT EDUCATION Description: See Patient Education module for education specifics  Outcome: Progressing Goal: Nutrition Consult-if indicated Outcome: Progressing Goal: Diabetes Guidelines if Diabetic/Glucose > 140 Description: If diabetic or lab glucose is > 140 mg/dl - Initiate Diabetes/Hyperglycemia Guidelines & Document Interventions  Outcome: Progressing   Problem: RH BOWEL ELIMINATION Goal: RH STG MANAGE BOWEL WITH ASSISTANCE Description: STG Manage Bowel with Minimal Assistance. Outcome: Progressing   Problem: RH BLADDER ELIMINATION Goal: RH STG MANAGE BLADDER WITH ASSISTANCE Description: STG Manage Bladder With Minimal Assistance Outcome: Progressing   Problem: RH SKIN INTEGRITY Goal: RH STG SKIN FREE OF INFECTION/BREAKDOWN Description: Patient will verbalize ways to prevent skin break down.  Outcome: Progressing Goal: RH STG MAINTAIN SKIN INTEGRITY WITH ASSISTANCE Description: STG Maintain Skin Integrity With Minimal Assistance. Outcome: Progressing   Problem: RH SAFETY Goal: RH STG ADHERE TO SAFETY PRECAUTIONS W/ASSISTANCE/DEVICE Description: STG Adhere to Safety Precautions With Minimal Assistance/Device. Outcome: Progressing Goal: RH STG DECREASED RISK OF FALL WITH ASSISTANCE Description: STG Decreased Risk of Fall With Minimal Assistance. Outcome: Progressing   Problem: RH KNOWLEDGE DEFICIT Goal: RH STG INCREASE KNOWLEDGE OF HYPERTENSION Description: Patient will verbalize ways to manage blood pressure.  Outcome: Progressing Goal: RH STG INCREASE KNOWLEDGE OF STROKE PROPHYLAXIS Description: Patient will verbalize signs and symptoms of a stroke by discharge.  Outcome: Progressing   

## 2019-08-26 ENCOUNTER — Ambulatory Visit (HOSPITAL_COMMUNITY): Payer: Self-pay | Admitting: Physical Therapy

## 2019-08-26 ENCOUNTER — Inpatient Hospital Stay (HOSPITAL_COMMUNITY): Payer: Self-pay | Admitting: *Deleted

## 2019-08-26 ENCOUNTER — Inpatient Hospital Stay (HOSPITAL_COMMUNITY): Payer: Self-pay | Admitting: Speech Pathology

## 2019-08-26 ENCOUNTER — Encounter (HOSPITAL_COMMUNITY): Payer: Self-pay | Admitting: Occupational Therapy

## 2019-08-26 ENCOUNTER — Encounter (HOSPITAL_COMMUNITY): Payer: Self-pay | Admitting: Speech Pathology

## 2019-08-26 MED ORDER — CLOPIDOGREL BISULFATE 75 MG PO TABS: 75.0000 mg | ORAL_TABLET | Freq: Every day | ORAL | 1 refills | Status: DC

## 2019-08-26 MED ORDER — LEVETIRACETAM ER 500 MG PO TB24: 500.0000 mg | ORAL_TABLET | Freq: Every day | ORAL | 0 refills | Status: DC

## 2019-08-26 MED ORDER — AMLODIPINE BESYLATE 10 MG PO TABS: ORAL_TABLET | ORAL | 0 refills | Status: DC

## 2019-08-26 MED ORDER — SIMVASTATIN 20 MG PO TABS: 20.0000 mg | ORAL_TABLET | Freq: Every day | ORAL | 3 refills | Status: DC

## 2019-08-26 MED ORDER — QUETIAPINE FUMARATE 25 MG PO TABS: 25.0000 mg | ORAL_TABLET | Freq: Every day | ORAL | 0 refills | Status: DC

## 2019-08-26 MED ORDER — PANTOPRAZOLE SODIUM 40 MG PO TBEC: 40.0000 mg | DELAYED_RELEASE_TABLET | Freq: Two times a day (BID) | ORAL | 1 refills | Status: DC

## 2019-08-26 NOTE — Plan of Care (Signed)
  Problem: Consults Goal: RH STROKE PATIENT EDUCATION Description: See Patient Education module for education specifics  Outcome: Progressing Goal: Nutrition Consult-if indicated Outcome: Progressing Goal: Diabetes Guidelines if Diabetic/Glucose > 140 Description: If diabetic or lab glucose is > 140 mg/dl - Initiate Diabetes/Hyperglycemia Guidelines & Document Interventions  Outcome: Progressing   Problem: RH BOWEL ELIMINATION Goal: RH STG MANAGE BOWEL WITH ASSISTANCE Description: STG Manage Bowel with Minimal Assistance. Outcome: Progressing   Problem: RH BLADDER ELIMINATION Goal: RH STG MANAGE BLADDER WITH ASSISTANCE Description: STG Manage Bladder With Minimal Assistance Outcome: Progressing   Problem: RH SKIN INTEGRITY Goal: RH STG SKIN FREE OF INFECTION/BREAKDOWN Description: Patient will verbalize ways to prevent skin break down.  Outcome: Progressing Goal: RH STG MAINTAIN SKIN INTEGRITY WITH ASSISTANCE Description: STG Maintain Skin Integrity With Minimal Assistance. Outcome: Progressing   Problem: RH SAFETY Goal: RH STG ADHERE TO SAFETY PRECAUTIONS W/ASSISTANCE/DEVICE Description: STG Adhere to Safety Precautions With Minimal Assistance/Device. Outcome: Progressing Goal: RH STG DECREASED RISK OF FALL WITH ASSISTANCE Description: STG Decreased Risk of Fall With Minimal Assistance. Outcome: Progressing   Problem: RH KNOWLEDGE DEFICIT Goal: RH STG INCREASE KNOWLEDGE OF HYPERTENSION Description: Patient will verbalize ways to manage blood pressure.  Outcome: Progressing Goal: RH STG INCREASE KNOWLEDGE OF STROKE PROPHYLAXIS Description: Patient will verbalize signs and symptoms of a stroke by discharge.  Outcome: Progressing

## 2019-08-26 NOTE — Progress Notes (Signed)
Recreational Therapy Discharge Summary Patient Details  Name: AHMYA BERNICK MRN: [id number] Date of Birth: 11/11/1960 Today's Date: 08/26/2019  Long term goals set: 1  Long term goals met: 1  Comments on progress toward goals: TR sessions focused on activity analysis identifying potential modifications and community skills.  Pt is supervision ambulatory level for community reintegration using RW and given verbal cues for safety.  Goal met.  Pt is anxious to return home with family who will be providing 24 hour supervision.  Reasons for discharge: treatment goals met  Patient/family agrees with progress made and goals achieved: Yes  Nari Vannatter,[place]ri[place] 08/26/2019, 7:56 AM

## 2019-08-26 NOTE — Progress Notes (Signed)
PHYSICAL MEDICINE & REHABILITATION PROGRESS NOTE   Subjective/Complaints: Up with therapy going to bathroom.   ROS: Patient denies fever, rash, sore throat, blurred vision, nausea, vomiting, diarrhea, cough, shortness of breath or chest pain, joint or back pain, headache, or mood change.   Objective:  No results found. No results for input(s): WBC, HGB, HCT, PLT in the last 72 hours. No results for input(s): NA, K, CL, CO2, GLUCOSE, BUN, CREATININE, CALCIUM in the last 72 hours.  Intake/Output Summary (Last 24 hours) at 08/26/2019 0834 Last data filed at 08/25/2019 1828 Gross per 24 hour  Intake 480 ml  Output --  Net 480 ml     Physical Exam: Vital Signs Blood pressure (!) 144/86, pulse 87, temperature 98.6 F (37 C), temperature source Oral, resp. rate 18, height 5\' 3"  (1.6 m), weight 56.8 kg, SpO2 100 %. Constitutional: No distress . Vital signs reviewed. HEENT: EOMI, oral membranes moist Neck: supple Cardiovascular: RRR without murmur. No JVD    Respiratory/Chest: CTA Bilaterally without wheezes or rales. Normal effort    GI/Abdomen: BS +, non-tender, non-distended Ext: no clubbing, cyanosis, or edema Psych: pleasant and cooperative Skin: intact Neuro:  Eyes without evidence of nystagmus  Tone is normal without evidence of spasticity Cerebellar exam shows no evidence of ataxia on finger nose finger or heel to shin testing No evidence of trunkal ataxia Motor strength is 5/5 in bilateral deltoid, biceps, triceps, finger flexors and extensors, wrist flexors and extensors, hip flexors, knee flexors and extensors, ankle dorsiflexors, plantar flexors, invertors and evertors, toe flexors and extensors Musculoskeletal: Full range of motion in all 4 extremities. No joint swelling    Assessment/Plan: 1. Functional deficits secondary to bilateral cerebral infarcts which require 3+ hours per day of interdisciplinary therapy in a comprehensive inpatient rehab  setting.  Physiatrist is providing close team supervision and 24 hour management of active medical problems listed below.  Physiatrist and rehab team continue to assess barriers to discharge/monitor patient progress toward functional and medical goals  Care Tool:  Bathing    Body parts bathed by patient: Right arm, Left arm, Chest, Abdomen, Front perineal area, Buttocks, Right upper leg, Left upper leg, Left lower leg, Right lower leg, Face         Bathing assist Assist Level: Contact Guard/Touching assist     Upper Body Dressing/Undressing Upper body dressing   What is the patient wearing?: Pull over shirt    Upper body assist Assist Level: Supervision/Verbal cueing    Lower Body Dressing/Undressing Lower body dressing      What is the patient wearing?: Underwear/pull up, Pants     Lower body assist Assist for lower body dressing: Contact Guard/Touching assist     Toileting Toileting Toileting Activity did not occur (Clothing management and hygiene only): N/A (no void or bm)  Toileting assist Assist for toileting: Contact Guard/Touching assist     Transfers Chair/bed transfer  Transfers assist     Chair/bed transfer assist level: Contact Guard/Touching assist     Locomotion Ambulation   Ambulation assist      Assist level: Moderate Assistance - Patient 50 - 74% Assistive device: Lite Gait Max distance: 393ft   Walk 10 feet activity   Assist     Assist level: Contact Guard/Touching assist Assistive device: Walker-rolling   Walk 50 feet activity   Assist Walk 50 feet with 2 turns activity did not occur: Safety/medical concerns  Assist level: Contact Guard/Touching assist Assistive device: Walker-rolling  Walk 150 feet activity   Assist Walk 150 feet activity did not occur: Safety/medical concerns  Assist level: Contact Guard/Touching assist Assistive device: Walker-rolling    Walk 10 feet on uneven surface  activity   Assist  Walk 10 feet on uneven surfaces activity did not occur: Safety/medical concerns         Wheelchair     Assist   Type of Wheelchair: Manual    Wheelchair assist level: Supervision/Verbal cueing Max wheelchair distance: 150    Wheelchair 50 feet with 2 turns activity    Assist        Assist Level: Supervision/Verbal cueing   Wheelchair 150 feet activity     Assist      Assist Level: Supervision/Verbal cueing   Blood pressure (!) 144/86, pulse 87, temperature 98.6 F (37 C), temperature source Oral, resp. rate 18, height 5\' 3"  (1.6 m), weight 56.8 kg, SpO2 100 %.  Medical Problem List and Plan: 1.Right side weakness with decreased balance and dysphagiasecondary to bilateral cerebral convexity infarct frontal region -patient may  shower -Patient to see md in the office for transitional care encounter in 1-2 weeks.    -DC home 4/10  -Continue CIR therapies including PT, OT, and SLP  2. Antithrombotics: -DVT/anticoagulation:SCDs -antiplatelet therapy: ONLY plavix for now 3. Pain Management:Tylenol as needed, well controlled.  4. Mood:Provide emotional support -antipsychotic agentsSeroquel 25 mg nightly 5. Neuropsych: This patientiscapable of making decisions on herown behalf. 6. Skin/Wound Care:Routine skin checks 7. Fluids/Electrolytes/Nutrition:Routine in and outs with follow-up chemistries 8. Dysphagia. on regular/thins and tolerating 9. Hypertension. Norvasc 10 mg daily, clonidine 0.1 mg twice daily. at home    Vitals:   08/25/19 1936 08/26/19 0205  BP: (!) 146/79 (!) 144/86  Pulse: 89 87  Resp: 19 18  Temp: 98.2 F (36.8 C) 98.6 F (37 C)  SpO2: 100% 100%  resumed amlodipine at 5mg  08/24/2019  4/9: bp under better control with change. Continue to monitor 10. Seizure prophylaxis. Keppra 500 mg daily 11. Hyperlipidemia. Zocor 12. History of polysubstance abuse alcohol use.  Urine drug screen positive cocaine. Counseling 13. Hematemesis. Upper GI endoscopy 08/16/2019.Barrett's esophagus  Continue Protonix twice daily. 14.  ABLA Hgb stable at 9.4 4/5  .     LOS: 7 days A FACE TO FACE EVALUATION WAS PERFORMED  6/9 08/26/2019, 8:34 AM

## 2019-08-26 NOTE — Progress Notes (Signed)
Physical Therapy Discharge Summary  Patient Details  Name: Cindy [name] MRN: [id number] Date of Birth: 1960-12-01  Today's Date: 08/26/2019 PT Individual Time: 1005-1050 1345-1410 PT Individual Time Calculation (min): 45 min  And 25 min    Patient has met 9 of 10 long term goals due to improved activity tolerance, improved balance, improved postural control, increased strength, ability to compensate for deficits, functional use of  right upper extremity and right lower extremity, improved attention, improved awareness and improved coordination.  Patient to discharge at an ambulatory level Supervision.   Patient's care partner is independent to provide the necessary physical and cognitive assistance at discharge.  Reasons goals not met: balance/strength deficits keep 5xSTS >20 seconds   Recommendation:  Patient will benefit from ongoing skilled PT services in home health setting to continue to advance safe functional mobility, address ongoing impairments in balance, coordination, safety, gait, transfers, endurance, and minimize fall risk.  Equipment: RW  Reasons for discharge: treatment goals met and discharge from hospital  Patient/family agrees with progress made and goals achieved: Yes   PT treatment: Pt received sitting in WC and agreeable to PT. Pt transported to atrium and performed dynamic gait training in simulated community environment with RW and supervision assist through gift shop, food court and over unlevel cement sidewalk, 267f, 1031f and [id number] to remain without RW and to push from [facility] to improve safety. PT instructed pt in Grad day assessment to measure progress toward goals. See below for details. WC Mobility x [id number] supervision assist and cues for safety with doorway management. Patient returned to room and [facility] call bell in reach and all needs met.    Session 2.  Pt received sitting in WC and agreeable to PT. Gait training  through hall [id number] supervision assist and BUE support on RW. Cues for decreased force of heel contact with noted improvement in coordination. Stair negotiation training x 12 steps with BUE support and supervision assist. Min cues for step-to gait pattern, but pt self selecting to perform step through with ascent. 5x STS : 24 seconds (>15 seconds indicates increased fall risk) Car transfer training with supervision assist x 2 attempts pt self selected to step into car on first attempt. PT then educated pt in sit>pivot technique to reduce fall risk. Additional gait training up/down ramp with supervision assist and RW. Patient returned to room and performed stand pivot to recliner with supervision A. Pt left sitting in recliner with call bell in reach and all needs met.       PT Discharge Precautions/Restrictions Restrictions Weight Bearing Restrictions: No Pain Pain Assessment Pain Scale: 0-10 Pain Score: 0-No pain Vision/Perception  Perception Perception: Within Functional Limits Praxis Praxis: Intact  Cognition Overall Cognitive Status: Impaired/Different from baseline Arousal/Alertness: Awake/alert Orientation Level: Oriented X4 Selective Attention: Impaired Selective Attention Impairment: Functional complex;Verbal complex Memory: Impaired Memory Impairment: Decreased short term memory;Retrieval deficit Decreased Short Term Memory: Verbal complex Awareness: Impaired Awareness Impairment: Emergent impairment Problem Solving: Impaired Problem Solving Impairment: Verbal complex;Functional complex Executive Function: Sequencing Sequencing: Impaired Sequencing Impairment: Functional complex Behaviors: Impulsive Safety/Judgment: Appears intact Comments: mildly impulsive but much improved since admission Sensation Sensation Light Touch: Appears Intact Proprioception: Impaired Detail Proprioception Impaired Details: Impaired RUE;Impaired RLE(mild  impairment) Coordination Gross Motor Movements are Fluid and Coordinated: No Fine Motor Movements are Fluid and Coordinated: No Coordination and Movement Description: mild ataxia on the R Finger Nose Finger Test: MIld dysmetria Heel Shin Test:  mild Ataxia RLE. LLE WFL Motor  Motor Motor: Hemiplegia;Ataxia Motor - Discharge Observations: mild R sided ataxia improved from eavl  Mobility Bed Mobility Bed Mobility: Rolling Right;Rolling Left;Supine to Sit;Sit to Supine Rolling Right: Independent Rolling Left: Independent Supine to Sit: Independent Sit to Supine: Independent Transfers Transfers: Sit to Stand;Stand Pivot Transfers Sit to Stand: Supervision/Verbal cueing Stand Pivot Transfers: Supervision/Verbal cueing Transfer (Assistive device): Rolling walker Locomotion  Gait Ambulation: Yes Gait Assistance: Supervision/Verbal cueing Gait Distance (Feet): 200 Feet Assistive device: Rolling walker Gait Gait: Yes Gait Pattern: Impaired Gait Pattern: Ataxic Stairs / Additional Locomotion Stairs: Yes Stairs Assistance: Supervision/Verbal cueing Stair Management Technique: Two rails Number of Stairs: 12 Height of Stairs: 6 Wheelchair Mobility Wheelchair Mobility: Yes Wheelchair Assistance: Chartered loss adjuster: Both upper extremities Wheelchair Parts Management: Independent Distance: 150  Trunk/Postural Assessment  Cervical Assessment Cervical Assessment: Exceptions to WFL(forward head) Thoracic Assessment Thoracic Assessment: Exceptions to WFL(rounded shoulders) Lumbar Assessment Lumbar Assessment: Within Functional Limits Postural Control Postural Control: Deficits on evaluation(mild trunkal ataxia noted without UE support)  Balance Balance Balance Assessed: Yes Standardized Balance Assessment Standardized Balance Assessment: Timed Up and Go Test Timed Up and Go Test TUG: Normal TUG(with RW) Normal TUG (seconds): 60 Static Sitting  Balance Static Sitting - Level of Assistance: 7: Independent Dynamic Sitting Balance Dynamic Sitting - Level of Assistance: 6: Modified independent (Device/Increase time) Static Standing Balance Static Standing - Balance Support: No upper extremity supported Static Standing - Level of Assistance: 5: Stand by assistance Dynamic Standing Balance Dynamic Standing - Balance Support: During functional activity;Bilateral upper extremity supported Dynamic Standing - Level of Assistance: 5: Stand by assistance Extremity Assessment      RLE Assessment RLE Assessment: Within Functional Limits General Strength Comments: grossly 4+/5 to 5/5 LLE Assessment LLE Assessment: Within Functional Limits General Strength Comments: grossly 4+/5 to 5/5 proximal to distal    Lorie Phenix 08/26/2019, 10:51 AM

## 2019-08-26 NOTE — Progress Notes (Signed)
Occupational Therapy Session Note  Patient Details  Name: Cindy [name] MRN: [id number] Date of Birth: 1960/09/10  Today's Date: 08/26/2019 OT Individual Time: 1420-1503 OT Individual Time Calculation (min): 43 min   Short Term Goals: Week 1:  OT Short Term Goal 1 (Week 1): Pt will complete toilet transfer with LRAD and CGA OT Short Term Goal 2 (Week 1): Pt will complete LB dressing with supervision for sit<stand OT Short Term Goal 3 (Week 1): Pt will complete shower transfer with LRAD and CGA  Skilled Therapeutic Interventions/Progress Updates:    Pt greeted in the recliner with no c/o pain. Ready to shower. She completed bathing (sit<stand from TTB), toileting (using standard toilet, +bladder void), dressing (sit<stand from EOB using RW for standing support), and oral care (sitting at the sink) during session. All functional transfers completed at ambulatory level using RW with min-mod vcs for safe RW placement and management during self care tasks. She is still mildly impulsive with transitional movement, tried to exit shower before putting on her gripper socks. Stated ADL tasks completed at supervision level for safety cuing. Her family did not arrive for scheduled family education. We tried to call spouse [name] however he did not answer the phone and per pt, they do not have a voicemail set up. We discussed pts need for 24/7 supervision, need for nonslip footwear during all mobility, and safe RW management during self care. Pt verbalized understanding. Left her with all needs within reach and chair alarm set.   Therapy Documentation Precautions:  Precautions Precautions: Fall Precaution Comments: Rt hemi, ataxia Restrictions Weight Bearing Restrictions: No Pain: Pain Assessment Pain Scale: 0-10 Pain Score: 0-No pain ADL: ADL Eating: Set up(per most recent staff report) Grooming: Setup Where Assessed-Grooming: Sitting at sink Upper Body Bathing: Setup Where Assessed-Upper Body  Bathing: Shower Lower Body Bathing: Supervision/safety Where Assessed-Lower Body Bathing: Shower Upper Body Dressing: Setup Where Assessed-Upper Body Dressing: Edge of bed Lower Body Dressing: Supervision/safety Where Assessed-Lower Body Dressing: Edge of bed Toileting: Supervision/safety Where Assessed-Toileting: Teacher, adult education: Close supervision Toilet Transfer Method: Ambulating(RW) Film/video editor: Close supervision Film/video editor Method: Ambulating(RW) Astronomer: Emergency planning/management officer     Therapy/Group: Individual Therapy  Lailoni Baquera A Emilina Smarr 08/26/2019, 4:09 PM

## 2019-08-26 NOTE — Discharge Summary (Signed)
Occupational Therapy Discharge Summary  Patient Details  Name: Cindy [name] MRN: [id number] Date of Birth: February 02, 1961  Patient has met 9 of 9 long term goals due to improved activity tolerance, improved balance, postural control, ability to compensate for deficits, improved awareness and improved coordination.  Patient to discharge at overall Supervision level.  Patient's family did not show up for scheduled family education. OT tried to call spouse [name] on the telephone for verbal education however he did not answer and there was no voicemail set up on his phone to leave a message. Reiterated to pt importance of carryover of safety education that she's learned at [facility] to maximize her safety at home during self care tasks.   All goals met.   Recommendation:  Patient will benefit from ongoing skilled OT services in home health setting to continue to advance functional skills in the area of iADL.  Equipment: TTB  Reasons for discharge: treatment goals met and discharge from hospital  Patient/family agrees with progress made and goals achieved: Yes  OT Discharge Vital Signs Therapy Vitals Temp: 98.8 F (37.1 C) Temp Source: Oral Pulse Rate: 96 Resp: 16 BP: (!) 158/88 Patient Position (if appropriate): Sitting Oxygen Therapy SpO2: 100 % O2 Device: Room Air Pain Pain Assessment Pain Scale: 0-10 Pain Score: 0-No pain ADL ADL Eating: Set up(per most recent staff report) Grooming: Setup Where Assessed-Grooming: Sitting at sink Upper Body Bathing: Setup Where Assessed-Upper Body Bathing: Shower Lower Body Bathing: Supervision/safety Where Assessed-Lower Body Bathing: Shower Upper Body Dressing: Setup Where Assessed-Upper Body Dressing: Edge of bed Lower Body Dressing: Supervision/safety Where Assessed-Lower Body Dressing: Edge of bed Toileting: Supervision/safety Where Assessed-Toileting: Glass blower/designer: Close supervision Toilet Transfer Method:  Ambulating(RW) Social research officer, government: Close supervision Social research officer, government Method: Ambulating(RW) Youth worker: Radio broadcast assistant Praxis Praxis: Intact Cognition Arousal/Alertness: Awake/alert Orientation Level: Oriented X4 Camera operator Movements are Fluid and Coordinated: No Fine Motor Movements are Fluid and Coordinated: No Coordination and Movement Description: mild ataxia on the R Finger Nose Finger Test: Mild dysmetria Rt>Lt Motor:  Supervision for ambulatory bathroom transfers using RW  Balance Balance Balance Assessed: Yes Dynamic Sitting Balance Dynamic Sitting - Level of Assistance: 5: Stand by assistance(donning gripper socks sitting edge of tub bench) Dynamic Standing Balance Dynamic Standing - Balance Support: During functional activity;No upper extremity supported Dynamic Standing - Level of Assistance: 5: Stand by assistance Dynamic Standing - Balance Activities: Lateral lean/weight shifting;Forward lean/weight shifting(toileting tasks) Extremity/Trunk Assessment RUE Assessment RUE Assessment: Within Functional Limits Active Range of Motion (AROM) Comments: WNL General Strength Comments: 4-/5 grossly, ataxic but improved since time of eval LUE Assessment LUE Assessment: Within Functional Limits Active Range of Motion (AROM) Comments: WNL General Strength Comments: 4+/5 grossly   Joia Doyle A Hawkins Seaman 08/26/2019, 2:53 PM

## 2019-08-26 NOTE — Plan of Care (Signed)

## 2019-08-26 NOTE — Progress Notes (Signed)
Recreational Therapy Session Note  Patient Details  Name: SALA TAGUE MRN: [id number] Date of Birth: 11-01-1960 Today's Date: 08/26/2019 Time:  1003-1030 Pain: no c/o Skilled Therapeutic Interventions/Progress Updates:   Pt participated in simulated Community reintegration/outing to hospital gift shop and outside on hospital grounds  at overall supervision ambulatory level using RW.  Pt did required verbal cues for safety throughout session including reaching back prior to sitting, pushing up form seating, position in the walker while ambulating.    Goals focused on safe community mobility and identification & negotiation of obstacles. Therapy/Group: Co-Treatment  Tasmin Exantus,Chrystine 08/26/2019, 7:56 AM

## 2019-08-27 NOTE — Progress Notes (Signed)
Patient discharged home with family, no questions or concerns at this time.

## 2019-08-30 ENCOUNTER — Telehealth: Payer: Self-pay

## 2019-08-30 NOTE — Telephone Encounter (Signed)
Transitional Care call--patient   1. Are you/is patient experiencing any problems since coming home? No Are there any questions regarding any aspect of care? No 2. Are there any questions regarding medications administration/dosing? No Are meds being taken as prescribed? Yes Patient should review meds with caller to confirm 3. Have there been any falls? No 4. Has Home Health been to the house and/or have they contacted you? Yes If not, have you tried to contact them? Can we help you contact them? 5. Are bowels and bladder emptying properly? Yes Are there any unexpected incontinence issues? No If applicable, is patient following bowel/bladder programs? 6. Any fevers, problems with breathing, unexpected pain? No 7. Are there any skin problems or new areas of breakdown? No 8. Has the patient/family member arranged specialty MD follow up (ie cardiology/neurology/renal/surgical/etc)? Not yet  Can we help arrange? 9. Does the patient need any other services or support that we can help arrange? Yes 10. Are caregivers following through as expected in assisting the patient? Yes 11. Has the patient quit smoking, drinking alcohol, or using drugs as recommended? Yes  Appointment time 11:20 am,arrive time 11:00 am and with Jacalyn Lefevre, NP then Dr. Wynn Banker [phone]

## 2019-08-31 ENCOUNTER — Ambulatory Visit: Payer: Self-pay

## 2019-09-02 ENCOUNTER — Telehealth: Payer: Self-pay

## 2019-09-02 NOTE — Telephone Encounter (Signed)
Marcelino Duster, OT from [place] called stating during OT eval pretty much contact guard assist with self care. Unsteady and does not use walker around the house but decline HHOT at this time. Patient states that its too many people coming in her house. Patient is still getting PT for ambulation and balance.

## 2019-09-08 ENCOUNTER — Encounter: Payer: Self-pay | Attending: Registered Nurse | Admitting: Registered Nurse

## 2019-09-15 DIAGNOSIS — G40901 Epilepsy, unspecified, not intractable, with status epilepticus: Secondary | ICD-10-CM

## 2019-09-15 DIAGNOSIS — K5900 Constipation, unspecified: Secondary | ICD-10-CM

## 2019-09-15 DIAGNOSIS — G9340 Encephalopathy, unspecified: Secondary | ICD-10-CM

## 2019-09-15 DIAGNOSIS — R7303 Prediabetes: Secondary | ICD-10-CM

## 2019-09-15 DIAGNOSIS — I119 Hypertensive heart disease without heart failure: Secondary | ICD-10-CM

## 2019-09-15 DIAGNOSIS — D649 Anemia, unspecified: Secondary | ICD-10-CM

## 2019-09-15 DIAGNOSIS — E7800 Pure hypercholesterolemia, unspecified: Secondary | ICD-10-CM

## 2019-09-15 DIAGNOSIS — M791 Myalgia, unspecified site: Secondary | ICD-10-CM

## 2019-09-15 DIAGNOSIS — I69391 Dysphagia following cerebral infarction: Secondary | ICD-10-CM

## 2019-09-15 DIAGNOSIS — I69393 Ataxia following cerebral infarction: Secondary | ICD-10-CM

## 2019-09-15 DIAGNOSIS — R4189 Other symptoms and signs involving cognitive functions and awareness: Secondary | ICD-10-CM

## 2019-09-15 DIAGNOSIS — Z7902 Long term (current) use of antithrombotics/antiplatelets: Secondary | ICD-10-CM

## 2019-09-15 DIAGNOSIS — Z9181 History of falling: Secondary | ICD-10-CM

## 2019-09-15 DIAGNOSIS — R1310 Dysphagia, unspecified: Secondary | ICD-10-CM

## 2019-09-15 DIAGNOSIS — E785 Hyperlipidemia, unspecified: Secondary | ICD-10-CM

## 2019-09-15 DIAGNOSIS — I69351 Hemiplegia and hemiparesis following cerebral infarction affecting right dominant side: Secondary | ICD-10-CM

## 2019-09-15 DIAGNOSIS — K449 Diaphragmatic hernia without obstruction or gangrene: Secondary | ICD-10-CM

## 2019-09-20 ENCOUNTER — Ambulatory Visit: Payer: Self-pay | Admitting: Neurology

## 2019-09-20 ENCOUNTER — Telehealth: Payer: Self-pay

## 2019-09-20 NOTE — Telephone Encounter (Signed)
Patient no show for appt today. 

## 2019-10-04 ENCOUNTER — Ambulatory Visit: Payer: Self-pay | Admitting: Neurology

## 2020-02-09 ENCOUNTER — Emergency Department (HOSPITAL_COMMUNITY): Payer: Medicaid Other

## 2020-02-09 ENCOUNTER — Encounter (HOSPITAL_COMMUNITY): Payer: Self-pay | Admitting: Emergency Medicine

## 2020-02-09 ENCOUNTER — Inpatient Hospital Stay (HOSPITAL_COMMUNITY)
Admission: EM | Admit: 2020-02-09 | Discharge: 2020-02-18 | DRG: 101 | Disposition: A | Discharge status: Home / Self Care | Payer: Medicaid Other | Attending: Family Medicine | Admitting: Family Medicine

## 2020-02-09 ENCOUNTER — Other Ambulatory Visit: Payer: Self-pay

## 2020-02-09 DIAGNOSIS — R7309 Other abnormal glucose: Secondary | ICD-10-CM | POA: Diagnosis present

## 2020-02-09 DIAGNOSIS — E222 Syndrome of inappropriate secretion of antidiuretic hormone: Secondary | ICD-10-CM | POA: Diagnosis present

## 2020-02-09 DIAGNOSIS — I69393 Ataxia following cerebral infarction: Secondary | ICD-10-CM

## 2020-02-09 DIAGNOSIS — Z8042 Family history of malignant neoplasm of prostate: Secondary | ICD-10-CM

## 2020-02-09 DIAGNOSIS — I10 Essential (primary) hypertension: Secondary | ICD-10-CM | POA: Diagnosis present

## 2020-02-09 DIAGNOSIS — E876 Hypokalemia: Secondary | ICD-10-CM | POA: Diagnosis not present

## 2020-02-09 DIAGNOSIS — E872 Acidosis: Secondary | ICD-10-CM | POA: Diagnosis present

## 2020-02-09 DIAGNOSIS — F1410 Cocaine abuse, uncomplicated: Secondary | ICD-10-CM

## 2020-02-09 DIAGNOSIS — Z7902 Long term (current) use of antithrombotics/antiplatelets: Secondary | ICD-10-CM

## 2020-02-09 DIAGNOSIS — E7800 Pure hypercholesterolemia, unspecified: Secondary | ICD-10-CM | POA: Diagnosis present

## 2020-02-09 DIAGNOSIS — I69322 Dysarthria following cerebral infarction: Secondary | ICD-10-CM

## 2020-02-09 DIAGNOSIS — Z825 Family history of asthma and other chronic lower respiratory diseases: Secondary | ICD-10-CM

## 2020-02-09 DIAGNOSIS — Z8249 Family history of ischemic heart disease and other diseases of the circulatory system: Secondary | ICD-10-CM

## 2020-02-09 DIAGNOSIS — R6510 Systemic inflammatory response syndrome (SIRS) of non-infectious origin without acute organ dysfunction: Secondary | ICD-10-CM | POA: Diagnosis present

## 2020-02-09 DIAGNOSIS — Z20822 Contact with and (suspected) exposure to covid-19: Secondary | ICD-10-CM | POA: Diagnosis present

## 2020-02-09 DIAGNOSIS — F1420 Cocaine dependence, uncomplicated: Secondary | ICD-10-CM | POA: Diagnosis present

## 2020-02-09 DIAGNOSIS — G40901 Epilepsy, unspecified, not intractable, with status epilepticus: Principal | ICD-10-CM | POA: Diagnosis present

## 2020-02-09 DIAGNOSIS — G9340 Encephalopathy, unspecified: Secondary | ICD-10-CM | POA: Diagnosis present

## 2020-02-09 DIAGNOSIS — R569 Unspecified convulsions: Secondary | ICD-10-CM

## 2020-02-09 DIAGNOSIS — E785 Hyperlipidemia, unspecified: Secondary | ICD-10-CM | POA: Diagnosis present

## 2020-02-09 DIAGNOSIS — Z79899 Other long term (current) drug therapy: Secondary | ICD-10-CM

## 2020-02-09 DIAGNOSIS — E871 Hypo-osmolality and hyponatremia: Secondary | ICD-10-CM | POA: Diagnosis present

## 2020-02-09 DIAGNOSIS — E7439 Other disorders of intestinal carbohydrate absorption: Secondary | ICD-10-CM | POA: Diagnosis present

## 2020-02-09 DIAGNOSIS — F10239 Alcohol dependence with withdrawal, unspecified: Secondary | ICD-10-CM | POA: Diagnosis present

## 2020-02-09 LAB — COMPREHENSIVE METABOLIC PANEL
ALT: 16 U/L (ref 0–44)
AST: 27 U/L (ref 15–41)
Albumin: 3.9 g/dL (ref 3.5–5.0)
Alkaline Phosphatase: 94 U/L (ref 38–126)
Anion gap: 12 (ref 5–15)
BUN: 10 mg/dL (ref 6–20)
CO2: 19 mmol/L — ABNORMAL LOW (ref 22–32)
Calcium: 9.2 mg/dL (ref 8.9–10.3)
Chloride: 96 mmol/L — ABNORMAL LOW (ref 98–111)
Creatinine, Ser: 1.08 mg/dL — ABNORMAL HIGH (ref 0.44–1.00)
GFR calc Af Amer: >60 mL/min (ref >60)
GFR calc non Af Amer: 56 mL/min — ABNORMAL LOW (ref >60)
Glucose, Bld: 134 mg/dL — ABNORMAL HIGH (ref 70–99)
Potassium: 3.5 mmol/L (ref 3.5–5.1)
Sodium: 127 mmol/L — ABNORMAL LOW (ref 135–145)
Total Bilirubin: 0.8 mg/dL (ref 0.3–1.2)
Total Protein: 8.7 g/dL — ABNORMAL HIGH (ref 6.5–8.1)

## 2020-02-09 LAB — URINALYSIS, ROUTINE W REFLEX MICROSCOPIC
Bilirubin Urine: NEGATIVE
Glucose, UA: NEGATIVE mg/dL
Ketones, ur: NEGATIVE mg/dL
Leukocytes,Ua: NEGATIVE
Nitrite: NEGATIVE
Protein, ur: 100 mg/dL — AB
Specific Gravity, Urine: 1.013 (ref 1.005–1.030)
pH: 7 (ref 5.0–8.0)

## 2020-02-09 LAB — CBC WITH DIFFERENTIAL/PLATELET
Abs Immature Granulocytes: 0.06 10*3/uL (ref 0.00–0.07)
Basophils Absolute: 0 10*3/uL (ref 0.0–0.1)
Basophils Relative: 0 %
Eosinophils Absolute: 0 10*3/uL (ref 0.0–0.5)
Eosinophils Relative: 0 %
HCT: 43.9 % (ref 36.0–46.0)
Hemoglobin: 14.2 g/dL (ref 12.0–15.0)
Immature Granulocytes: 0 %
Lymphocytes Relative: 3 %
Lymphs Abs: 0.5 10*3/uL — ABNORMAL LOW (ref 0.7–4.0)
MCH: 27.7 pg (ref 26.0–34.0)
MCHC: 32.3 g/dL (ref 30.0–36.0)
MCV: 85.7 fL (ref 80.0–100.0)
Monocytes Absolute: 0.6 10*3/uL (ref 0.1–1.0)
Monocytes Relative: 4 %
Neutro Abs: 13.4 10*3/uL — ABNORMAL HIGH (ref 1.7–7.7)
Neutrophils Relative %: 93 %
Platelets: 311 10*3/uL (ref 150–400)
RBC: 5.12 MIL/uL — ABNORMAL HIGH (ref 3.87–5.11)
RDW: 15.3 % (ref 11.5–15.5)
WBC: 14.5 10*3/uL — ABNORMAL HIGH (ref 4.0–10.5)
nRBC: 0 % (ref 0.0–0.2)

## 2020-02-09 LAB — RESPIRATORY PANEL BY RT PCR (FLU A&B, COVID)
Influenza A by PCR: NEGATIVE
Influenza B by PCR: NEGATIVE
SARS Coronavirus 2 by RT PCR: NEGATIVE

## 2020-02-09 LAB — RAPID URINE DRUG SCREEN, HOSP PERFORMED
Amphetamines: NOT DETECTED
Barbiturates: NOT DETECTED
Benzodiazepines: POSITIVE — AB
Cocaine: POSITIVE — AB
Opiates: NOT DETECTED
Tetrahydrocannabinol: NOT DETECTED

## 2020-02-09 LAB — APTT: aPTT: 25 seconds (ref 24–36)

## 2020-02-09 LAB — PROTIME-INR
INR: 1 (ref 0.8–1.2)
Prothrombin Time: 12.9 seconds (ref 11.4–15.2)

## 2020-02-09 LAB — POC URINE PREG, ED: Preg Test, Ur: NEGATIVE

## 2020-02-09 LAB — ETHANOL: Alcohol, Ethyl (B): <10 mg/dL (ref <10)

## 2020-02-09 LAB — LACTIC ACID, PLASMA: Lactic Acid, Venous: 2.2 mmol/L (ref 0.5–1.9)

## 2020-02-09 IMAGING — DX DG CHEST 1V PORT
1 series · 1 of 1 positions shown · non-contrast
Comparison: [DATE]

CLINICAL DATA: Weakness.

EXAM:
PORTABLE CHEST 1 VIEW

[chest ap]
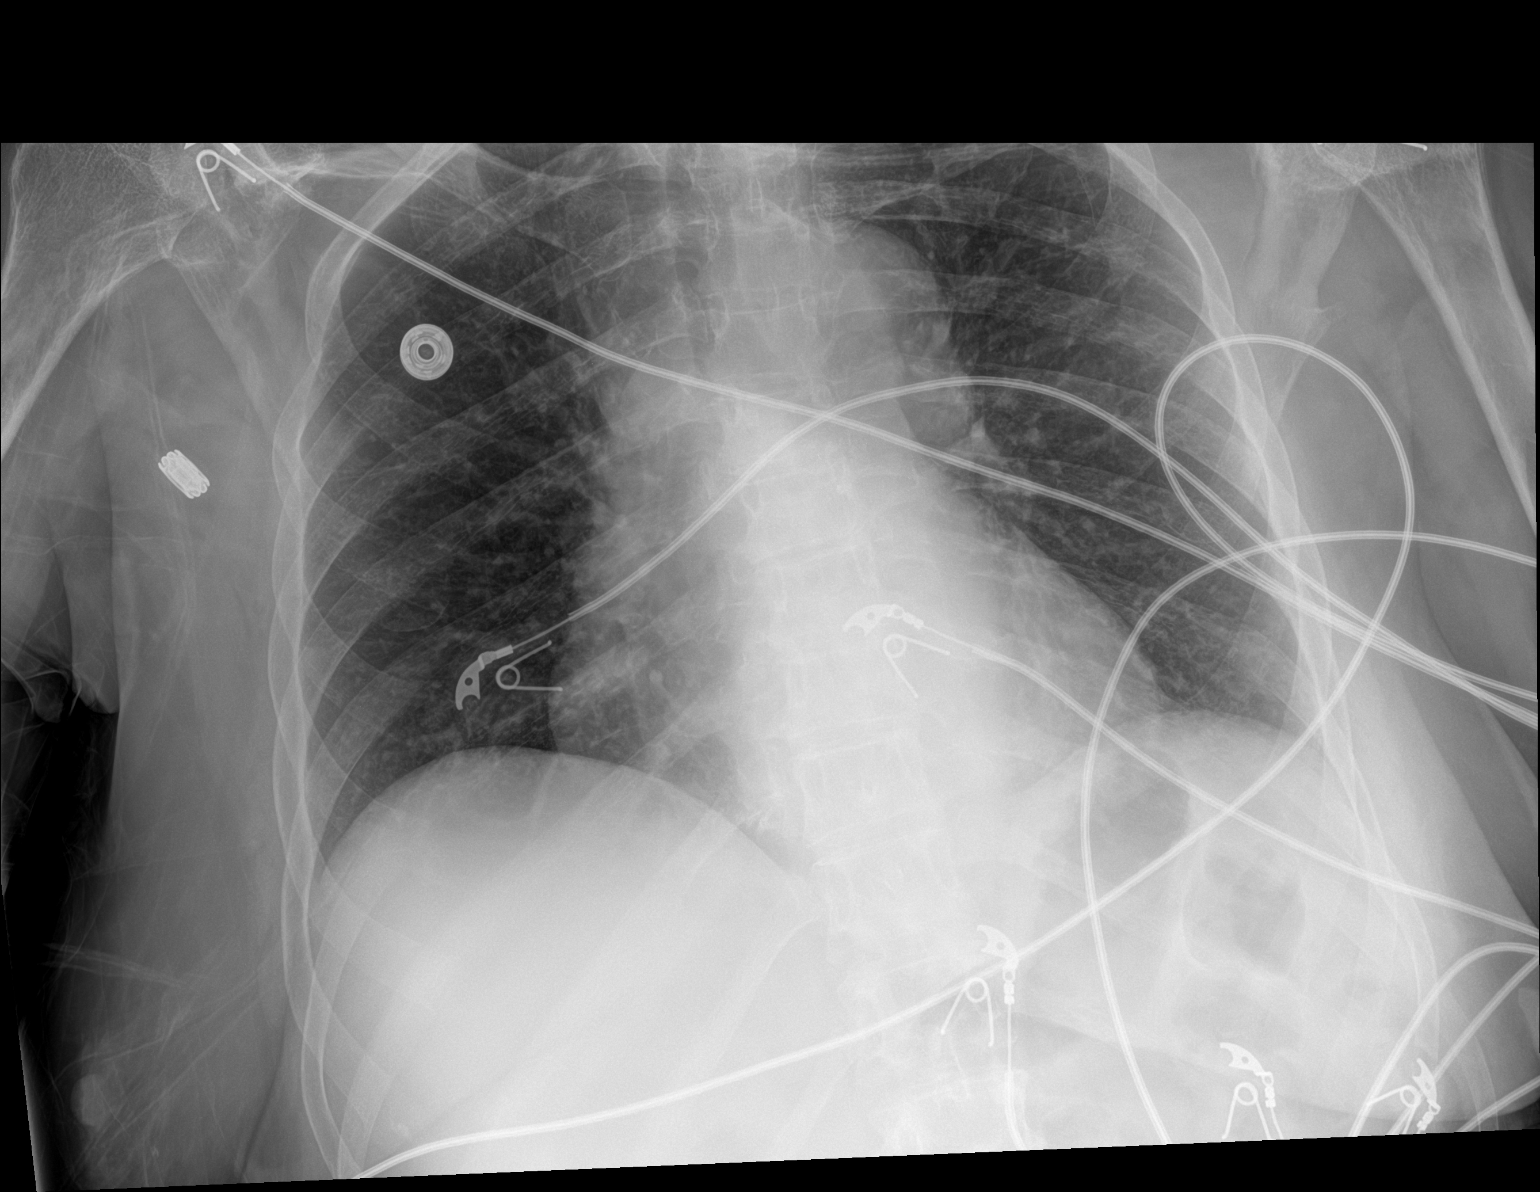

[1 of 1 positions shown; findings below may reference images not displayed]

FINDINGS: Lung volumes are low. Upper normal heart size likely accentuated by
low lung volumes. Unchanged mediastinal contours. No acute or focal
airspace disease. No pleural effusion or pneumothorax. No pulmonary
edema. Chronic right AC joint separation. Bones are under
mineralized. No acute osseous abnormalities are seen.
IMPRESSION: Low lung volumes without acute abnormality.

## 2020-02-09 IMAGING — CT CT HEAD W/O CM
3 series · 15 of 47 positions shown, 18 images · non-contrast
Comparison: Head CT [DATE], brain MRI [DATE]

CLINICAL DATA: Cerebral hemorrhage suspected

Lethargy with seizure.
EXAM:
CT HEAD WITHOUT CONTRAST
TECHNIQUE: Contiguous axial images were obtained from the base of the skull
through the vertex without intravenous contrast.

[Series 2: head w o · axial · 0.49mm/px · z∈[+1395,+1525]mm · 9 of 32 slices shown, 12 images]
[im 3/32  brain]
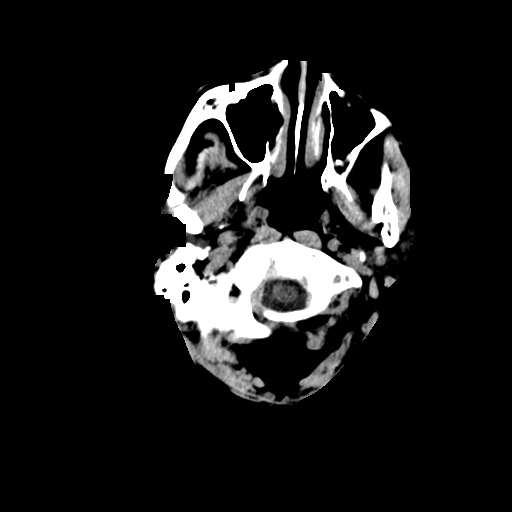
[im 3/32  bone]
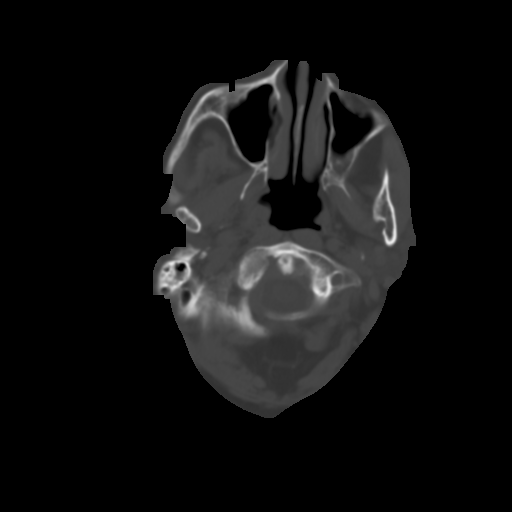
[im 6/32  brain]
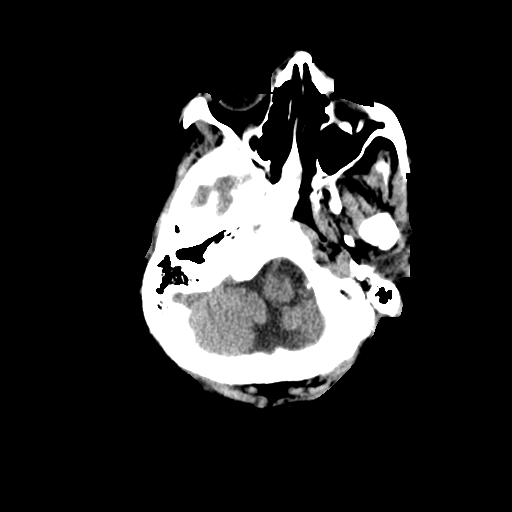
[im 9/32  brain]
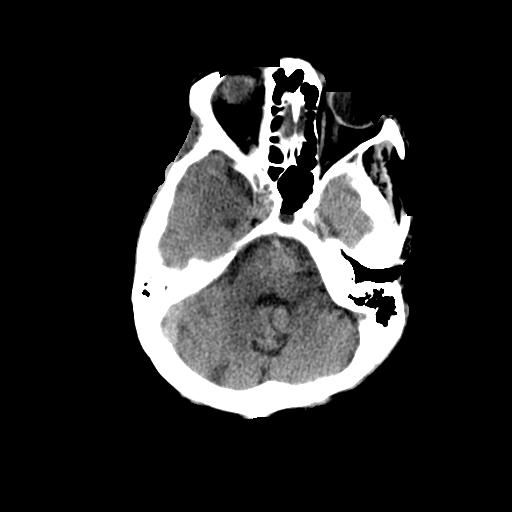
[im 12/32  brain]
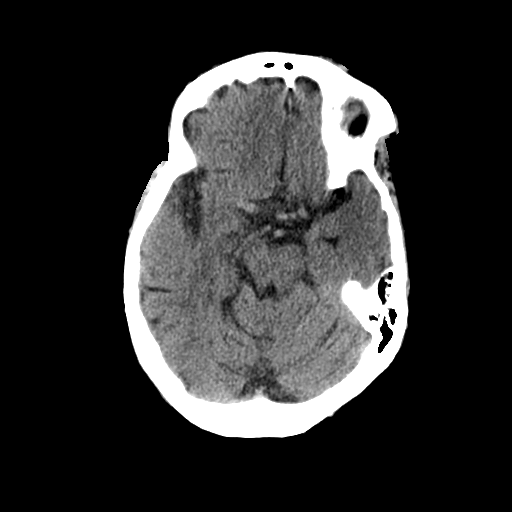
[im 17/32  brain]
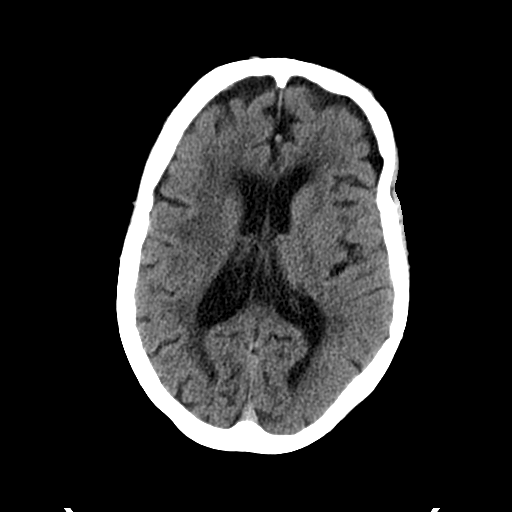
[im 17/32  bone]
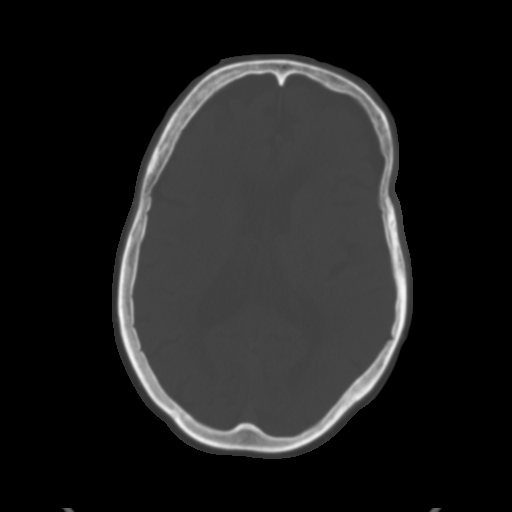
[im 20/32  brain]
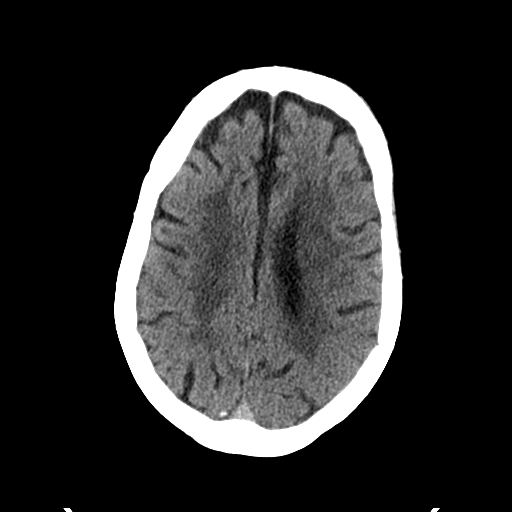
[im 23/32  brain]
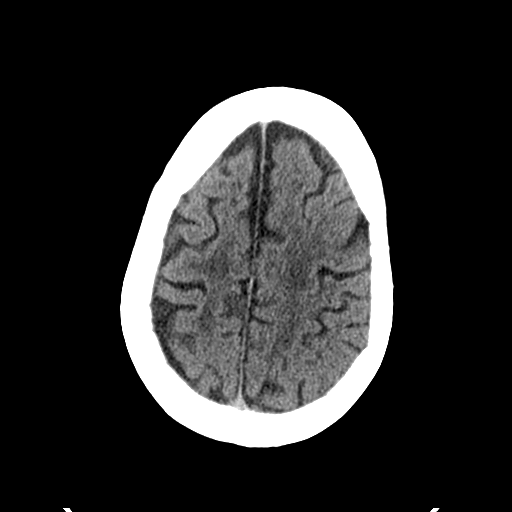
[im 26/32  brain]
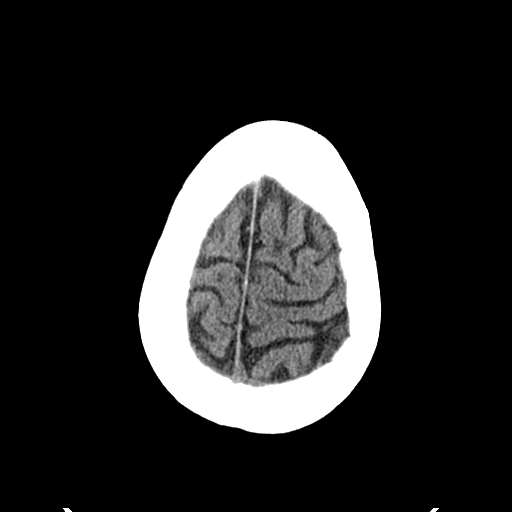
[im 29/32  brain]
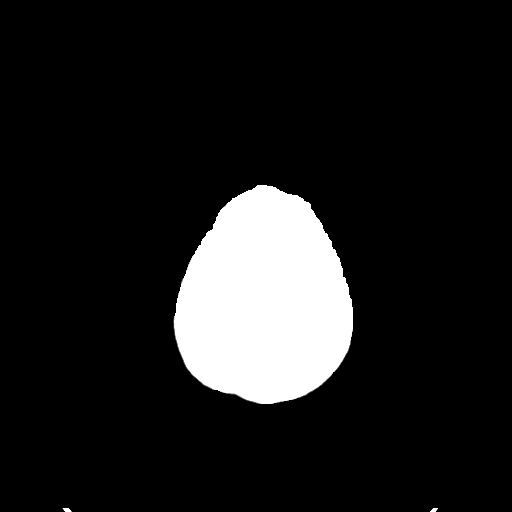
[im 29/32  bone]
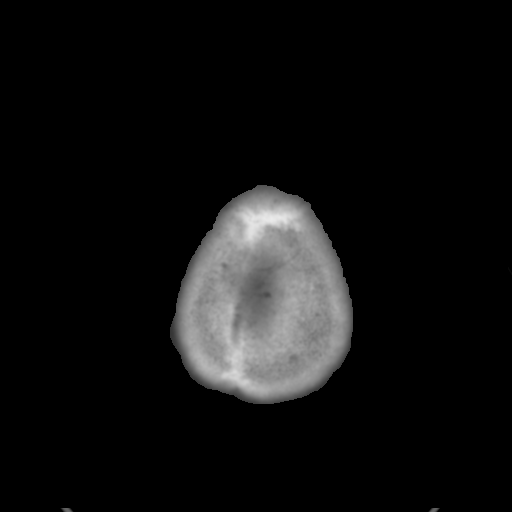

[Series 4: coronal soft · coronal · 0.34mm/px · 3 of 70 slices shown]
[im 24/70  brain]
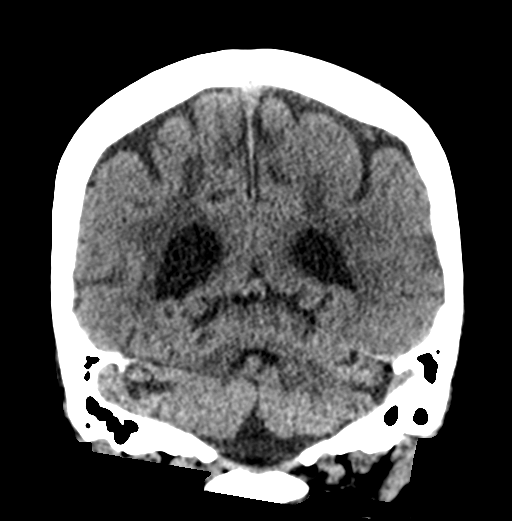
[im 31/70  brain]
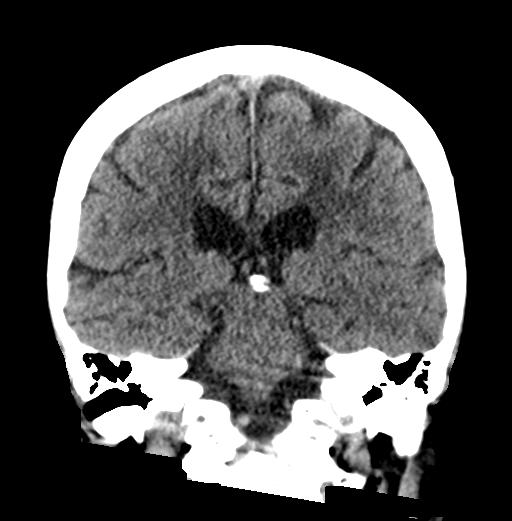
[im 39/70  brain]
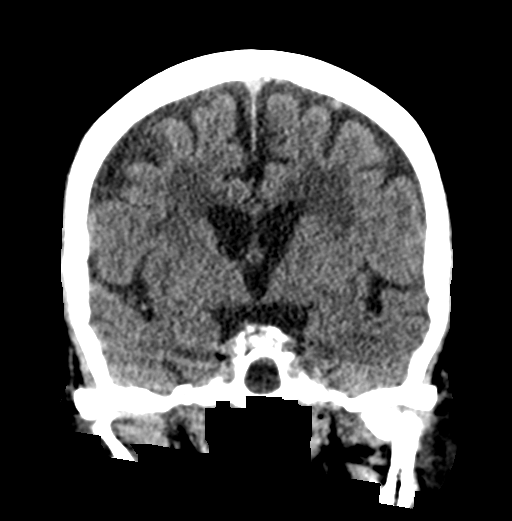

[Series 5: sagittal soft · sagittal · 0.35mm/px · 3 of 53 slices shown]
[im 20/53  brain]
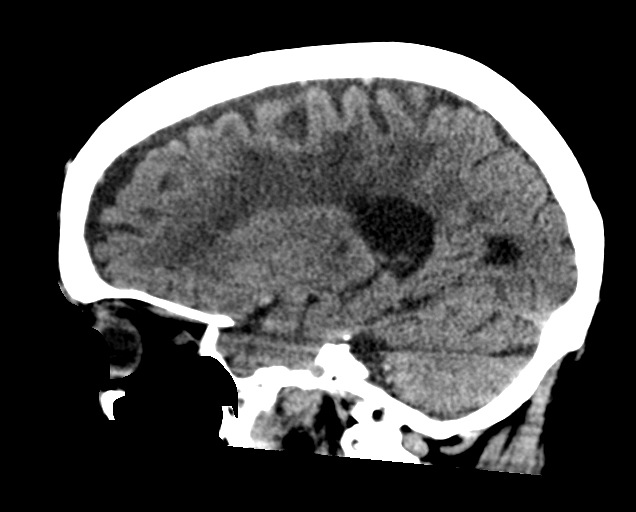
[im 27/53  brain]
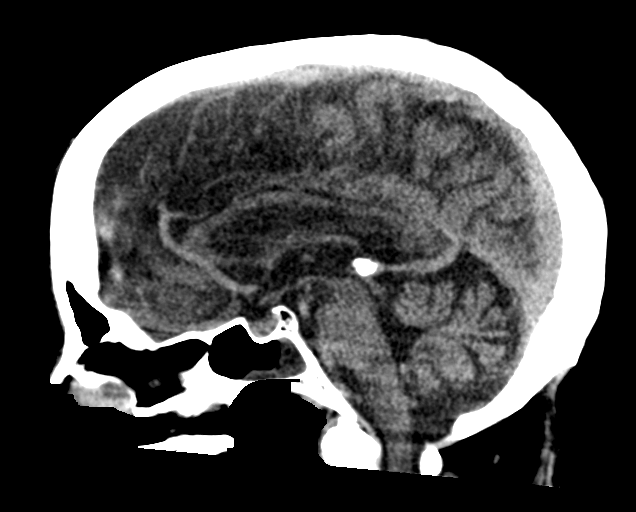
[im 35/53  brain]
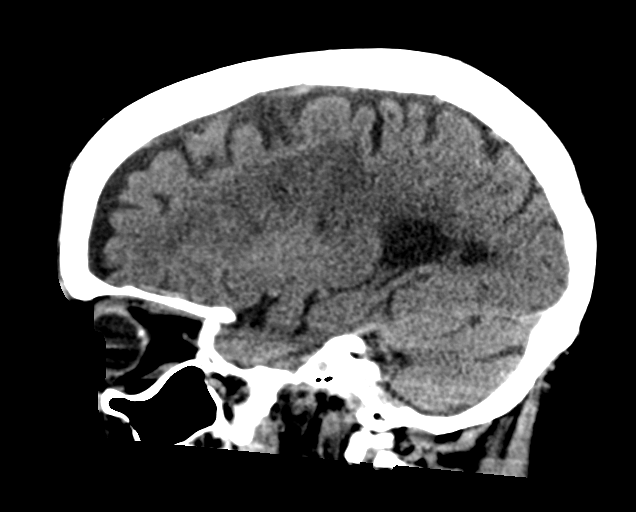

[15 of 47 positions shown; findings below may reference images not displayed]

FINDINGS: Brain: No acute hemorrhage. Generalized atrophy, mild but age
advanced. Advanced periventricular white matter hypodensity
consistent with chronic small vessel ischemia, particularly advanced
for age. Lacunar infarct in the right basal ganglia. Remote but new
from [DATE]. Additional tiny bilateral basal gangliar lacunar
infarcts are unchanged. No evidence of acute ischemia. No subdural
or extra-axial collection.

Vascular: Atherosclerosis of skullbase vasculature without
hyperdense vessel or abnormal calcification.

Skull: No fracture or focal lesion.

Sinuses/Orbits: Fluid level with frothy debris in the left side of
sphenoid sinus. Remaining paranasal sinuses are clear. No mastoid
effusion. Orbits are unremarkable.

Other: None.
IMPRESSION: 1. No acute intracranial abnormality.
2. Generalized atrophy and chronic small vessel ischemia similar to
prior exam but advanced for age. Lacunar infarct in the right basal
ganglia appears remote, but new from [DATE].
3. Fluid level with frothy debris in the left side of the sphenoid
sinus, can be seen with acute sinusitis.

## 2020-02-09 MED ORDER — ENOXAPARIN SODIUM 40 MG/0.4ML ~~LOC~~ SOLN
40.0000 mg | SUBCUTANEOUS | Status: DC
  Administered 2020-02-10 – 2020-02-17 (×8): 40 mg via SUBCUTANEOUS
  Filled 2020-02-09 (×8): qty 0.4

## 2020-02-09 MED ORDER — LACTATED RINGERS IV BOLUS (SEPSIS)
1000.0000 mL | Freq: Once | INTRAVENOUS | Status: AC
  Administered 2020-02-10: 1000 mL via INTRAVENOUS

## 2020-02-09 MED ORDER — POTASSIUM CHLORIDE IN NACL 20-0.9 MEQ/L-% IV SOLN
INTRAVENOUS | Status: DC
  Filled 2020-02-09: qty 1000

## 2020-02-09 MED ORDER — VANCOMYCIN HCL 1500 MG/300ML IV SOLN
1500.0000 mg | Freq: Once | INTRAVENOUS | Status: AC
  Administered 2020-02-09: 1500 mg via INTRAVENOUS
  Filled 2020-02-09: qty 300

## 2020-02-09 MED ORDER — METRONIDAZOLE IN NACL 5-0.79 MG/ML-% IV SOLN
500.0000 mg | Freq: Once | INTRAVENOUS | Status: AC
  Administered 2020-02-09: 500 mg via INTRAVENOUS
  Filled 2020-02-09: qty 100

## 2020-02-09 MED ORDER — SODIUM CHLORIDE 0.9 % IV SOLN
2.0000 g | Freq: Once | INTRAVENOUS | Status: AC
  Administered 2020-02-09: 2 g via INTRAVENOUS
  Filled 2020-02-09: qty 2

## 2020-02-09 MED ORDER — SODIUM CHLORIDE 0.9 % IV BOLUS
1000.0000 mL | Freq: Once | INTRAVENOUS | Status: AC
  Administered 2020-02-09: 1000 mL via INTRAVENOUS

## 2020-02-09 MED ORDER — METOPROLOL TARTRATE 5 MG/5ML IV SOLN
2.5000 mg | Freq: Once | INTRAVENOUS | Status: AC
  Administered 2020-02-09: 2.5 mg via INTRAVENOUS
  Filled 2020-02-09: qty 5

## 2020-02-09 MED ORDER — LEVETIRACETAM IN NACL 1000 MG/100ML IV SOLN
1000.0000 mg | Freq: Once | INTRAVENOUS | Status: AC
  Administered 2020-02-09: 1000 mg via INTRAVENOUS
  Filled 2020-02-09: qty 100

## 2020-02-09 MED ORDER — ONDANSETRON HCL 4 MG/2ML IJ SOLN: 4.0000 mg | Freq: Four times a day (QID) | INTRAMUSCULAR | Status: DC | PRN

## 2020-02-09 MED ORDER — ACETAMINOPHEN 650 MG RE SUPP: 650.0000 mg | Freq: Four times a day (QID) | RECTAL | Status: DC | PRN

## 2020-02-09 MED ORDER — SODIUM CHLORIDE 0.9 % IV SOLN
2.0000 g | Freq: Three times a day (TID) | INTRAVENOUS | Status: DC
  Administered 2020-02-10: 2 g via INTRAVENOUS
  Filled 2020-02-09: qty 2

## 2020-02-09 MED ORDER — VANCOMYCIN HCL 500 MG/100ML IV SOLN
500.0000 mg | Freq: Two times a day (BID) | INTRAVENOUS | Status: DC
  Filled 2020-02-09: qty 100

## 2020-02-09 MED ORDER — ACETAMINOPHEN 650 MG RE SUPP
650.0000 mg | Freq: Once | RECTAL | Status: AC
  Administered 2020-02-09: 650 mg via RECTAL
  Filled 2020-02-09: qty 1

## 2020-02-09 MED ORDER — ONDANSETRON HCL 4 MG PO TABS: 4.0000 mg | ORAL_TABLET | Freq: Four times a day (QID) | ORAL | Status: DC | PRN

## 2020-02-09 MED ORDER — ACETAMINOPHEN 325 MG PO TABS
650.0000 mg | ORAL_TABLET | Freq: Four times a day (QID) | ORAL | Status: DC | PRN
  Administered 2020-02-15: 650 mg via ORAL
  Filled 2020-02-09: qty 2

## 2020-02-09 MED ORDER — VANCOMYCIN HCL IN DEXTROSE 1-5 GM/200ML-% IV SOLN: 1000.0000 mg | Freq: Once | INTRAVENOUS | Status: DC

## 2020-02-09 NOTE — ED Triage Notes (Addendum)
Patient brought in by [facility] for seizures. Family states patient has been lethargic for 24 hours. EMS reports patient actively seizing and EMS gave 5 mg of versed. Patient has a past history of CVA but unsure what her deficits are from the past CVA. Patient flaccid on the right side and has little movement on the left side.

## 2020-02-09 NOTE — Progress Notes (Signed)
Pharmacy Antibiotic Note  Cindy [name] is a 59 y.o. female admitted on 02/09/2020 with sepsis/ infection of unknown source.  Pharmacy has been consulted for vancomycin/cefepime dosing. SCr pending on admit.  Plan: Cefepime 2g IV x 1 Vancomycin 1500mg  IV x1 Flagyl 500mg  IV x 1 per EDP - f/u if to continue Monitor clinical progress, c/s, renal function F/u de-escalation plan/LOT, vancomycin levels as indicated F/u pending SCr prior to entering antibiotic maintenance doses   Height: 5\' 7"  (170.2 cm) Weight: 61.2 kg (135 lb) IBW/kg (Calculated) : 61.6  Temp (24hrs), Avg:102.4 F (39.1 C), Min:102.4 F (39.1 C), Max:102.4 F (39.1 C)  No results for input(s): WBC, CREATININE, LATICACIDVEN, VANCOTROUGH, VANCOPEAK, VANCORANDOM, GENTTROUGH, GENTPEAK, GENTRANDOM, TOBRATROUGH, TOBRAPEAK, TOBRARND, AMIKACINPEAK, AMIKACINTROU, AMIKACIN in the last 168 hours.  CrCl cannot be calculated (Patient's most recent lab result is older than the maximum 21 days allowed.).    No Known Allergies  Antimicrobials this admission: 9/23 vancomycin >>  9/23 cefepime >>  9/23 flagyl x 1  Dose adjustments this admission:   Microbiology results:   10/23, PharmD, BCPS Please check AMION for all [facility] contact numbers Clinical Pharmacist 02/09/2020 8:19 PM

## 2020-02-09 NOTE — ED Provider Notes (Signed)
[facility] EMERGENCY DEPARTMENT Provider Note   CSN: [id number] Arrival date & time: 02/09/20  1954     History Chief Complaint  Patient presents with  . Seizures    Cindy [name] is a 59 y.o. female.  Patient with a history of substance abuse seizures and a stroke.  Also has hypertension.  For the last 24 hours she has been lethargic and when the paramedics got there after they put her in the ambulance she had a seizure and was given 5 of Versed.  The history is provided by the EMS personnel. No language interpreter was used.  Seizures Seizure activity on arrival: yes   Seizure type:  Grand mal Preceding symptoms: no sensation of an aura present   Initial focality:  None Episode characteristics: abnormal movements   Postictal symptoms: confusion   Return to baseline: no   Severity:  Moderate Duration: Unknown. Timing: Unknown. Progression:  Improving Context: not alcohol withdrawal        Past Medical History:  Diagnosis Date  . Abnormal LFTs    Hx of  . CVA (cerebral vascular accident) (HCC) 2005  . Hyperlipidemia   . Hypertension   . Polysubstance dependence including opioid type drug, episodic abuse [facility])    cocaine/etoh     Patient Active Problem List   Diagnosis Date Noted  . Seizures (HCC) 02/09/2020  . 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 Cerebral embolism with cerebral infarction 08/13/2019  . Status epilepticus (HCC) 08/05/2019  . Acute encephalopathy 08/05/2019  . TRANSAMINASES, SERUM, ELEVATED 10/23/2009  . HYPERCHOLESTEROLEMIA 07/03/2008  . ALCOHOL USE 07/03/2008  . Essential hypertension 07/03/2008  . STROKE 07/03/2008  . DYSPHAGIA UNSPECIFIED 07/03/2008  . DIABETES MELLITUS, BORDERLINE 07/03/2008  . Pain in joint, shoulder region 06/22/2008  . SPONDYLOSIS 06/22/2008  . CERVICALGIA 06/22/2008  . SPINAL STENOSIS 06/22/2008  . CERVICAL SPASM 06/22/2008    Past Surgical History:    Procedure Laterality Date  . ESOPHAGOGASTRODUODENOSCOPY N/A 08/16/2019   Procedure: ESOPHAGOGASTRODUODENOSCOPY (EGD);  Surgeon: Vida Rigger, MD;  Location: [facility] ENDOSCOPY;  Service: Endoscopy;  Laterality: N/A;     OB History    Gravida  2   Para  0   Term      Preterm      AB  2   Living        SAB  2   TAB      Ectopic      Multiple      Live Births              Family History  Problem Relation Age of Onset  . Cancer Father        prostate   . Hypertension Mother   . Asthma Mother   . Bronchitis Mother     Social History   Tobacco Use  . Smoking status: Never Smoker  . Smokeless tobacco: Never Used  Substance Use Topics  . Alcohol use: Yes    Comment: Drinks three 40 ounces beers 3 days per week , other days none to one 40 ounce beer per day  . Drug use: No    Types: Cocaine    Comment: not now    Home Medications Prior to Admission medications   Medication Sig Start Date End Date Taking? Authorizing Provider  amLODipine (NORVASC) 10 MG tablet TAKE 1 TABLET BY MOUTH EVERY DAY (NEEDS TO MAKE AN APPOINTMENT) 08/26/19   Angiulli, Mcarthur Rossetti, PA-C  clopidogrel (PLAVIX) 75 MG tablet Take 1 tablet (75 mg total) by mouth daily. 08/26/19   Angiulli, Mcarthur Rossettianiel J, PA-C  levETIRAcetam (KEPPRA XR) 500 MG 24 hr tablet Take 1 tablet (500 mg total) by mouth daily. 08/26/19   Angiulli, Mcarthur Rossettianiel J, PA-C  Multiple Vitamin (MULTIVITAMIN) LIQD Take 15 mLs by mouth daily. 08/20/19   Amin, Loura HaltAnkit Chirag, MD  pantoprazole (PROTONIX) 40 MG tablet Take 1 tablet (40 mg total) by mouth 2 (two) times daily before a meal. 08/26/19   Angiulli, Mcarthur Rossettianiel J, PA-C  QUEtiapine (SEROQUEL) 25 MG tablet Take 1 tablet (25 mg total) by mouth at bedtime. 08/26/19   Angiulli, Mcarthur Rossettianiel J, PA-C  sennosides (SENOKOT) 8.8 MG/5ML syrup Place 5 mLs into feeding tube 2 (two) times daily as needed for mild constipation. 08/19/19   Amin, Loura HaltAnkit Chirag, MD  simvastatin (ZOCOR) 20 MG tablet Take 1 tablet (20 mg total) by mouth  daily. 08/26/19   Angiulli, Mcarthur Rossettianiel J, PA-C    Allergies    Patient has no known allergies.  Review of Systems   Review of Systems  Unable to perform ROS: Mental status change  Neurological: Positive for seizures.    Physical Exam Updated Vital Signs BP (!) 172/96   Pulse (!) 101   Temp (!) 100.9 F (38.3 C) (Oral)   Resp 19   Ht 5\' 7"  (1.702 m)   Wt 61.2 kg   SpO2 100%   BMI 21.14 kg/m   Physical Exam Vitals and nursing note reviewed.  Constitutional:      Appearance: She is well-developed.     Comments: Lethargic  HENT:     Head: Normocephalic.     Nose: Nose normal.  Eyes:     General: No scleral icterus.    Conjunctiva/sclera: Conjunctivae normal.  Neck:     Thyroid: No thyromegaly.  Cardiovascular:     Rate and Rhythm: Normal rate and regular rhythm.     Heart sounds: No murmur heard.  No friction rub. No gallop.   Pulmonary:     Breath sounds: No stridor. No wheezing or rales.  Chest:     Chest wall: No tenderness.  Abdominal:     General: There is no distension.     Tenderness: There is no abdominal tenderness. There is no rebound.  Musculoskeletal:        General: Normal range of motion.     Cervical back: Neck supple.  Lymphadenopathy:     Cervical: No cervical adenopathy.  Skin:    Findings: No erythema or rash.  Neurological:     Motor: No abnormal muscle tone.     Coordination: Coordination normal.     Comments: Patient only responding to painful stimuli.  Patient able to move all extremities but moving right arm and right leg less     ED Results / Procedures / Treatments   Labs (all labs ordered are listed, but only abnormal results are displayed) Labs Reviewed  CBC WITH DIFFERENTIAL/PLATELET - Abnormal; Notable for the following components:      Result Value   WBC 14.5 (*)    RBC 5.12 (*)    Neutro Abs 13.4 (*)    Lymphs Abs 0.5 (*)    All other components within normal limits  COMPREHENSIVE METABOLIC PANEL - Abnormal; Notable for  the following components:   Sodium 127 (*)    Chloride 96 (*)    CO2 19 (*)    Glucose, Bld 134 (*)    Creatinine, Ser  1.08 (*)    Total Protein 8.7 (*)    GFR calc non Af Amer 56 (*)    All other components within normal limits  LACTIC ACID, PLASMA - Abnormal; Notable for the following components:   Lactic Acid, Venous 2.2 (*)    All other components within normal limits  URINALYSIS, ROUTINE W REFLEX MICROSCOPIC - Abnormal; Notable for the following components:   Hgb urine dipstick SMALL (*)    Protein, ur 100 (*)    Bacteria, UA RARE (*)    All other components within normal limits  RAPID URINE DRUG SCREEN, HOSP PERFORMED - Abnormal; Notable for the following components:   Cocaine POSITIVE (*)    Benzodiazepines POSITIVE (*)    All other components within normal limits  CULTURE, BLOOD (ROUTINE X 2)  CULTURE, BLOOD (ROUTINE X 2)  RESPIRATORY PANEL BY RT PCR (FLU A&B, COVID)  URINE CULTURE  ETHANOL  PROTIME-INR  APTT  POC URINE PREG, ED    EKG None  Radiology CT Head Wo Contrast  Result Date: 02/09/2020 CLINICAL DATA:  Cerebral hemorrhage suspected Lethargy with seizure. EXAM: CT HEAD WITHOUT CONTRAST TECHNIQUE: Contiguous axial images were obtained from the base of the skull through the vertex without intravenous contrast. COMPARISON:  Head CT 08/05/2019, brain MRI 08/10/2019 FINDINGS: Brain: No acute hemorrhage. Generalized atrophy, mild but age advanced. Advanced periventricular white matter hypodensity consistent with chronic small vessel ischemia, particularly advanced for age. Lacunar infarct in the right basal ganglia. Remote but new from March 2021. Additional tiny bilateral basal gangliar lacunar infarcts are unchanged. No evidence of acute ischemia. No subdural or extra-axial collection. Vascular: Atherosclerosis of skullbase vasculature without hyperdense vessel or abnormal calcification. Skull: No fracture or focal lesion. Sinuses/Orbits: Fluid level with frothy  debris in the left side of sphenoid sinus. Remaining paranasal sinuses are clear. No mastoid effusion. Orbits are unremarkable. Other: None. IMPRESSION: 1. No acute intracranial abnormality. 2. Generalized atrophy and chronic small vessel ischemia similar to prior exam but advanced for age. Lacunar infarct in the right basal ganglia appears remote, but new from March 2021. 3. Fluid level with frothy debris in the left side of the sphenoid sinus, can be seen with acute sinusitis. Electronically Signed   By: Narda Rutherford M.D.   On: 02/09/2020 20:36   DG Chest Port 1 View  Result Date: 02/09/2020 CLINICAL DATA:  Weakness. EXAM: PORTABLE CHEST 1 VIEW COMPARISON:  08/07/2019 FINDINGS: Lung volumes are low. Upper normal heart size likely accentuated by low lung volumes. Unchanged mediastinal contours. No acute or focal airspace disease. No pleural effusion or pneumothorax. No pulmonary edema. Chronic right AC joint separation. Bones are under mineralized. No acute osseous abnormalities are seen. IMPRESSION: Low lung volumes without acute abnormality. Electronically Signed   By: Narda Rutherford M.D.   On: 02/09/2020 20:37    Procedures Procedures (including critical care time)  Medications Ordered in ED Medications  sodium chloride 0.9 % bolus 1,000 mL (has no administration in time range)  metroNIDAZOLE (FLAGYL) IVPB 500 mg (500 mg Intravenous New Bag/Given 02/09/20 2134)  vancomycin (VANCOREADY) IVPB 1500 mg/300 mL (has no administration in time range)  levETIRAcetam (KEPPRA) IVPB 1000 mg/100 mL premix (0 mg Intravenous Stopped 02/09/20 2212)  ceFEPIme (MAXIPIME) 2 g in sodium chloride 0.9 % 100 mL IVPB (0 g Intravenous Stopped 02/09/20 2212)  acetaminophen (TYLENOL) suppository 650 mg (650 mg Rectal Given 02/09/20 2106)  sodium chloride 0.9 % bolus 1,000 mL (1,000 mLs Intravenous New Bag/Given 02/09/20 2100)  ED Course  I have reviewed the triage vital signs and the nursing notes.  Pertinent  labs & imaging results that were available during my care of the patient were reviewed by me and considered in my medical decision making (see chart for details).    CRITICAL CARE Performed by: Bethann Berkshire Total critical care time: 40 minutes Critical care time was exclusive of separately billable procedures and treating other patients. Critical care was necessary to treat or prevent imminent or life-threatening deterioration. Critical care was time spent personally by me on the following activities: development of treatment plan with patient and/or surrogate as well as nursing, discussions with consultants, evaluation of patient's response to treatment, examination of patient, obtaining history from patient or surrogate, ordering and performing treatments and interventions, ordering and review of laboratory studies, ordering and review of radiographic studies, pulse oximetry and re-evaluation of patient's condition.  MDM Rules/Calculators/A&P                          Patient with altered mental status status post seizure.  She will be admitted to medicine      This patient presents to the ED for concern of altered mental status this involves an extensive number of treatment options, and is a complaint that carries with it a high risk of complications and morbidity.  The differential diagnosis includes seizure infection   Lab Tests:   I Ordered, reviewed, and interpreted labs, which included CBC chemistries show hyponatremia  Medicines ordered:   I ordered medication Keppra for seizures  Imaging Studies ordered:   I ordered imaging studies which included CT head  I independently visualized and interpreted imaging which showed no acute disease  Additional history obtained:   Additional history obtained from EMS  Previous records obtained and reviewed.  Consultations Obtained:   I consulted hospitalist and discussed lab and imaging findings  Reevaluation:  After  the interventions stated above, I reevaluated the patient and found minimal improvement  Critical Interventions:  .   Final Clinical Impression(s) / ED Diagnoses Final diagnoses:  Seizure [facility]    Rx / DC Orders ED Discharge Orders    None       Bethann Berkshire, MD 02/12/20 1001

## 2020-02-09 NOTE — Progress Notes (Signed)
Pharmacy Antibiotic Note  Cindy [name] is a 59 y.o. female admitted on 02/09/2020 with sepsis/ infection of unknown source.  Pharmacy has been consulted for vancomycin/cefepime dosing.   ClCr ~50 ml/min. WBC elevated, Tmax 100.9, HR 100s. UA and CXR negative   Plan: Cefepime 2g IV Q8 hr Vancomycin 500mg  Q 12 hr   Flagyl 500mg  IV x 1 per EDP - f/u if to continue Monitor clinical progress, c/s, renal function F/u de-escalation plan/LOT, vancomycin levels as indicated     Height: 5\' 7"  (170.2 cm) Weight: 61.2 kg (135 lb) IBW/kg (Calculated) : 61.6  Temp (24hrs), Avg:101.7 F (38.7 C), Min:100.9 F (38.3 C), Max:102.4 F (39.1 C)  Recent Labs  Lab 02/09/20 2057  WBC 14.5*  CREATININE 1.08*  LATICACIDVEN 2.2*    Estimated Creatinine Clearance: 53.5 mL/min (A) (by C-G formula based on SCr of 1.08 mg/dL (H)).    No Known Allergies  Antimicrobials this admission: 9/23 vancomycin >>  9/23 cefepime >>  9/23 flagyl x 1  Microbiology results: 9/23 BCx: pend 9/23 UCx: pend Flu/covid neg    10/23, PharmD, BCPS, BCCP Clinical Pharmacist  Please check AMION for all [facility] phone numbers After 10:00 PM, call Main Pharmacy [phone]

## 2020-02-09 NOTE — H&P (Signed)
History and Physical    Cindy [name] IRC:[id number] DOB: September 11, 1960 DOA: 02/09/2020  PCP: Avon Gully, MD  Patient coming from: Home.  I have personally briefly reviewed patient's old medical records in [facility]  Chief Complaint: Seizures.  HPI: Cindy [name] is a 59 y.o. female with medical history significant of LFTs abnormality history, history of multiple CVAs, hyperlipidemia, hypertension, polysubstance abuse including opioids and cocaine, seizure disorder who is brought via EMS to the emergency department after the patient started having seizures.  EMS was initially called because of family with patient being lethargic presents 24 hours or so.  When EMS arrived the patient was actively seizing and she received 5 mg of IV midazolam.  He was febrile in the emergency department without any obvious source.  She is still confused, does not follow simple commands and is unable to provide further information.  ED Course: Initial vital signs were temperature 102.4 F, pulse 118, respirations 18, blood pressure 130/107 mmHg and O2 sat 100% on 2 LPM via [place].  The patient received 2000 mL of NS bolus, 1000 mg of Keppra IVPB, cefepime, metronidazole and vancomycin.  Urinalysis shows small hemoglobinuria, proteinuria of the 100 mg/dL and rare bacteria microscopic examination.  UDS was positive for cocaine and benzodiazepines.  CBC showed a white count of 14.5, hemoglobin 14.2 g/dL and platelets 510.  PT is/INR/PTT within normal limits.  SARS sent influenza A/B PCR was negative.  Alcohol level was normal.  Lactic acid was 2.2, sodium 127, potassium 3.5, chloride 96 and CO2 19 mmol/L.  Glucose 134, BUN 10 and creatinine 1.0 mg/dL.  LFTs were normal, except for an elevated total protein of 8.7 g/dL.  Imaging: A portable 1 view chest radiograph shows low lung volumes without any acute abnormality.  CT head without contrast did not show any acute intracranial normality.  There is generalized atrophy  and chronic small vessel ischemia similar to prior exam, but advanced for the patient's age.  There was a lacunar infarct in the right basal ganglia that appears remote, but is new from March 2021.  Questionable sinusitis of the left sphenoid sinus due to fluid level with frothy debris is being seen.  Please see images and full radiology report for further detail.  Review of Systems: As per HPI otherwise all other systems reviewed and are negative.  Past Medical History:  Diagnosis Date  . Abnormal LFTs    Hx of  . CVA (cerebral vascular accident) (HCC) 2005  . Hyperlipidemia   . Hypertension   . Polysubstance dependence including opioid type drug, episodic abuse (HCC)    cocaine/etoh     Past Surgical History:  Procedure Laterality Date  . ESOPHAGOGASTRODUODENOSCOPY N/A 08/16/2019   Procedure: ESOPHAGOGASTRODUODENOSCOPY (EGD);  Surgeon: Vida Rigger, MD;  Location: [facility] ENDOSCOPY;  Service: Endoscopy;  Laterality: N/A;    Social History  reports that she has never smoked. She has never used smokeless tobacco. She reports current alcohol use. She reports that she does not use drugs.  No Known Allergies  Family History  Problem Relation Age of Onset  . Cancer Father        prostate   . Hypertension Mother   . Asthma Mother   . Bronchitis Mother    Prior to Admission medications   Medication Sig Start Date End Date Taking? Authorizing Provider  amLODipine (NORVASC) 10 MG tablet TAKE 1 TABLET BY MOUTH EVERY DAY (NEEDS TO MAKE AN APPOINTMENT) 08/26/19   Angiulli, Mcarthur Rossetti, PA-C  clopidogrel (PLAVIX) 75 MG tablet Take 1 tablet (75 mg total) by mouth daily. 08/26/19   Angiulli, Mcarthur Rossetti, PA-C  levETIRAcetam (KEPPRA XR) 500 MG 24 hr tablet Take 1 tablet (500 mg total) by mouth daily. 08/26/19   Angiulli, Mcarthur Rossetti, PA-C  Multiple Vitamin (MULTIVITAMIN) LIQD Take 15 mLs by mouth daily. 08/20/19   Amin, Loura Halt, MD  pantoprazole (PROTONIX) 40 MG tablet Take 1 tablet (40 mg total) by mouth 2  (two) times daily before a meal. 08/26/19   Angiulli, Mcarthur Rossetti, PA-C  QUEtiapine (SEROQUEL) 25 MG tablet Take 1 tablet (25 mg total) by mouth at bedtime. 08/26/19   Angiulli, Mcarthur Rossetti, PA-C  sennosides (SENOKOT) 8.8 MG/5ML syrup Place 5 mLs into feeding tube 2 (two) times daily as needed for mild constipation. 08/19/19   Amin, Loura Halt, MD  simvastatin (ZOCOR) 20 MG tablet Take 1 tablet (20 mg total) by mouth daily. 08/26/19   Charlton Amor, PA-C   Physical Exam: Vitals:   02/09/20 1959 02/09/20 2003 02/09/20 2200 02/09/20 2208  BP:  (!) 166/107 (!) 172/96   Pulse:  (!) 118 (!) 101   Resp:  18 19   Temp:  (!) 102.4 F (39.1 C)  (!) 100.9 F (38.3 C)  TempSrc:  Rectal  Oral  SpO2:  100% 100%   Weight: 61.2 kg     Height: 5\' 7"  (1.702 m)      Constitutional: Looks chronically ill.  Febrile. Eyes: PERRL, lids and conjunctivae mildly injected. ENMT: Mucous membranes are dry. Posterior pharynx clear of any exudate or lesions. Neck: normal, supple, no masses, no thyromegaly Respiratory: Mild rhonchi bilaterally, no wheezing, no crackles. Normal respiratory effort. No accessory muscle use.  Cardiovascular: Tachycardic in the 110s, no murmurs / rubs / gallops. No extremity edema. 2+ pedal pulses. No carotid bruits.  Abdomen: Nondistended.  BS positive.  Soft, no tenderness, no masses palpated. No hepatosplenomegaly. Musculoskeletal: no clubbing / cyanosis.  Good ROM, no contractures.  Relaxed muscle tone.  Skin: no rashes, lesions, ulcers on very limited dermatological examination. Neurologic: Sedated.  Unable to fully evaluate. Psychiatric: Postictal/sedated.  Does not follow commands.   Labs on Admission: I have personally reviewed following labs and imaging studies  CBC: Recent Labs  Lab 02/09/20 2057  WBC 14.5*  NEUTROABS 13.4*  HGB 14.2  HCT 43.9  MCV 85.7  PLT 311    Basic Metabolic Panel: Recent Labs  Lab 02/09/20 2057  NA 127*  K 3.5  CL 96*  CO2 19*  GLUCOSE  134*  BUN 10  CREATININE 1.08*  CALCIUM 9.2    GFR: Estimated Creatinine Clearance: 53.5 mL/min (A) (by C-G formula based on SCr of 1.08 mg/dL (H)).  Liver Function Tests: Recent Labs  Lab 02/09/20 2057  AST 27  ALT 16  ALKPHOS 94  BILITOT 0.8  PROT 8.7*  ALBUMIN 3.9    Urine analysis:    Component Value Date/Time   COLORURINE YELLOW 02/09/2020 2111   APPEARANCEUR CLEAR 02/09/2020 2111   LABSPEC 1.013 02/09/2020 2111   PHURINE 7.0 02/09/2020 2111   GLUCOSEU NEGATIVE 02/09/2020 2111   HGBUR SMALL (A) 02/09/2020 2111   BILIRUBINUR NEGATIVE 02/09/2020 2111   KETONESUR NEGATIVE 02/09/2020 2111   PROTEINUR 100 (A) 02/09/2020 2111   NITRITE NEGATIVE 02/09/2020 2111   LEUKOCYTESUR NEGATIVE 02/09/2020 2111    Radiological Exams on Admission: CT Head Wo Contrast  Result Date: 02/09/2020 CLINICAL DATA:  Cerebral hemorrhage suspected Lethargy with seizure. EXAM: CT  HEAD WITHOUT CONTRAST TECHNIQUE: Contiguous axial images were obtained from the base of the skull through the vertex without intravenous contrast. COMPARISON:  Head CT 08/05/2019, brain MRI 08/10/2019 FINDINGS: Brain: No acute hemorrhage. Generalized atrophy, mild but age advanced. Advanced periventricular white matter hypodensity consistent with chronic small vessel ischemia, particularly advanced for age. Lacunar infarct in the right basal ganglia. Remote but new from March 2021. Additional tiny bilateral basal gangliar lacunar infarcts are unchanged. No evidence of acute ischemia. No subdural or extra-axial collection. Vascular: Atherosclerosis of skullbase vasculature without hyperdense vessel or abnormal calcification. Skull: No fracture or focal lesion. Sinuses/Orbits: Fluid level with frothy debris in the left side of sphenoid sinus. Remaining paranasal sinuses are clear. No mastoid effusion. Orbits are unremarkable. Other: None. IMPRESSION: 1. No acute intracranial abnormality. 2. Generalized atrophy and chronic small  vessel ischemia similar to prior exam but advanced for age. Lacunar infarct in the right basal ganglia appears remote, but new from March 2021. 3. Fluid level with frothy debris in the left side of the sphenoid sinus, can be seen with acute sinusitis. Electronically Signed   By: Narda Rutherford M.D.   On: 02/09/2020 20:36   DG Chest Port 1 View  Result Date: 02/09/2020 CLINICAL DATA:  Weakness. EXAM: PORTABLE CHEST 1 VIEW COMPARISON:  08/07/2019 FINDINGS: Lung volumes are low. Upper normal heart size likely accentuated by low lung volumes. Unchanged mediastinal contours. No acute or focal airspace disease. No pleural effusion or pneumothorax. No pulmonary edema. Chronic right AC joint separation. Bones are under mineralized. No acute osseous abnormalities are seen. IMPRESSION: Low lung volumes without acute abnormality. Electronically Signed   By: Narda Rutherford M.D.   On: 02/09/2020 20:37    EKG: Independently reviewed.  Vent. rate 106 BPM PR interval * ms QRS duration 88 ms QT/QTc 351/467 ms P-R-T axes 44 17 17 Sinus tachycardia Biatrial enlargement RSR' in V1 or V2, right VCD or RVH  Assessment/Plan Principal Problem:   Seizures (HCC) Observation/stepdown. Continue supplemental oxygen. Neurochecks every 2 hours. Seizure precautions. Lorazepam 1 mg IVP every 4 hours PRN. Resume oral Keppra once more alert. Consider MRI imaging if persistent. Consider neurology evaluation.  Active Problems:   Acute encephalopathy Combination of postictal, medication and SIRS. Continue antiepileptic therapy. Continue neurochecks. Consider MRI imaging if no improvement.    SIRS (systemic inflammatory response syndrome)  (HCC)   Versus sepsis due to undetermined organism POA Febrile, but no obvious source. Lactic acidosis and leukocytosis could be due to seizure. Continue IV fluids. Check procalcitonin level. Continue IV antibiotics for the moment. Follow-up blood culture and  sensitivity.    Hyponatremia of unknown etiology GI losses? Medication induced? Continue NS infusion. Follow-up sodium level.    Cocaine abuse (HCC) Consult TOC team. Consider behavioral health.    HYPERCHOLESTEROLEMIA On simvastatin Resume after rec performed.    Essential hypertension Metoprolol IV while altered. Resume amlodipine once cleared for oral intake. Monitor BP and heart rate.    Glucose intolerance Carbohydrate modified diet. CBG monitoring. RI SS as needed. Check hemoglobin A1c.   DVT prophylaxis: Lovenox SQ. Code Status:   Full code. Family Communication: Disposition Plan:   Patient is from:  Home.  Anticipated DC to:  Home.  Anticipated DC date:  02/10/2020 or 02/11/2020.  Anticipated DC barriers: Clinical status.  Consults called: Admission status:  Observation/stepdown.  Severity of Illness:  Bobette Mo MD [facility]  How to contact the [facility] Attending or Consulting provider 7A - 7P or covering  provider during after hours 7P -7A, for this patient?   1. Check the [facility] and look for a) attending/consulting [facility] provider listed and b) the [facility] team listed 2. Log into [email or web address] and use [place]'s universal password to access. If you do not have the password, please contact the hospital operator. 3. Locate the [facility] provider you are looking for under [facility] and page to a number that you can be directly reached. 4. If you still have difficulty reaching the provider, please page the [facility] ([facility] Call) for the Hospitalists listed on amion for assistance.  02/09/2020, 10:57 PM   This document was prepared using Dragon voice recognition software and may contain some unintended transcription errors.

## 2020-02-10 ENCOUNTER — Inpatient Hospital Stay (HOSPITAL_COMMUNITY)
Admit: 2020-02-10 | Discharge: 2020-02-10 | Disposition: A | Discharge status: Home / Self Care | Payer: Medicaid Other | Attending: Internal Medicine | Admitting: Internal Medicine

## 2020-02-10 DIAGNOSIS — Z8249 Family history of ischemic heart disease and other diseases of the circulatory system: Secondary | ICD-10-CM | POA: Diagnosis not present

## 2020-02-10 DIAGNOSIS — E872 Acidosis: Secondary | ICD-10-CM | POA: Diagnosis present

## 2020-02-10 DIAGNOSIS — R569 Unspecified convulsions: Secondary | ICD-10-CM

## 2020-02-10 DIAGNOSIS — E876 Hypokalemia: Secondary | ICD-10-CM | POA: Diagnosis not present

## 2020-02-10 DIAGNOSIS — Z20822 Contact with and (suspected) exposure to covid-19: Secondary | ICD-10-CM | POA: Diagnosis present

## 2020-02-10 DIAGNOSIS — G40901 Epilepsy, unspecified, not intractable, with status epilepticus: Secondary | ICD-10-CM | POA: Diagnosis present

## 2020-02-10 DIAGNOSIS — I69322 Dysarthria following cerebral infarction: Secondary | ICD-10-CM | POA: Diagnosis not present

## 2020-02-10 DIAGNOSIS — F10239 Alcohol dependence with withdrawal, unspecified: Secondary | ICD-10-CM | POA: Diagnosis present

## 2020-02-10 DIAGNOSIS — E7439 Other disorders of intestinal carbohydrate absorption: Secondary | ICD-10-CM | POA: Diagnosis present

## 2020-02-10 DIAGNOSIS — Z7902 Long term (current) use of antithrombotics/antiplatelets: Secondary | ICD-10-CM | POA: Diagnosis not present

## 2020-02-10 DIAGNOSIS — Z79899 Other long term (current) drug therapy: Secondary | ICD-10-CM | POA: Diagnosis not present

## 2020-02-10 DIAGNOSIS — F1420 Cocaine dependence, uncomplicated: Secondary | ICD-10-CM | POA: Diagnosis present

## 2020-02-10 DIAGNOSIS — I10 Essential (primary) hypertension: Secondary | ICD-10-CM | POA: Diagnosis present

## 2020-02-10 DIAGNOSIS — E785 Hyperlipidemia, unspecified: Secondary | ICD-10-CM | POA: Diagnosis present

## 2020-02-10 DIAGNOSIS — E222 Syndrome of inappropriate secretion of antidiuretic hormone: Secondary | ICD-10-CM | POA: Diagnosis present

## 2020-02-10 DIAGNOSIS — I69393 Ataxia following cerebral infarction: Secondary | ICD-10-CM | POA: Diagnosis not present

## 2020-02-10 DIAGNOSIS — E7800 Pure hypercholesterolemia, unspecified: Secondary | ICD-10-CM | POA: Diagnosis present

## 2020-02-10 DIAGNOSIS — R6510 Systemic inflammatory response syndrome (SIRS) of non-infectious origin without acute organ dysfunction: Secondary | ICD-10-CM | POA: Diagnosis present

## 2020-02-10 DIAGNOSIS — Z8042 Family history of malignant neoplasm of prostate: Secondary | ICD-10-CM | POA: Diagnosis not present

## 2020-02-10 DIAGNOSIS — Z825 Family history of asthma and other chronic lower respiratory diseases: Secondary | ICD-10-CM | POA: Diagnosis not present

## 2020-02-10 LAB — COMPREHENSIVE METABOLIC PANEL
ALT: 14 U/L (ref 0–44)
AST: 27 U/L (ref 15–41)
Albumin: 3 g/dL — ABNORMAL LOW (ref 3.5–5.0)
Alkaline Phosphatase: 71 U/L (ref 38–126)
Anion gap: 11 (ref 5–15)
BUN: 10 mg/dL (ref 6–20)
CO2: 17 mmol/L — ABNORMAL LOW (ref 22–32)
Calcium: 8.2 mg/dL — ABNORMAL LOW (ref 8.9–10.3)
Chloride: 100 mmol/L (ref 98–111)
Creatinine, Ser: 0.94 mg/dL (ref 0.44–1.00)
GFR calc Af Amer: >60 mL/min (ref >60)
GFR calc non Af Amer: >60 mL/min (ref >60)
Glucose, Bld: 110 mg/dL — ABNORMAL HIGH (ref 70–99)
Potassium: 4 mmol/L (ref 3.5–5.1)
Sodium: 128 mmol/L — ABNORMAL LOW (ref 135–145)
Total Bilirubin: 1.3 mg/dL — ABNORMAL HIGH (ref 0.3–1.2)
Total Protein: 6.9 g/dL (ref 6.5–8.1)

## 2020-02-10 LAB — CBC WITH DIFFERENTIAL/PLATELET
Abs Immature Granulocytes: 0.05 10*3/uL (ref 0.00–0.07)
Basophils Absolute: 0 10*3/uL (ref 0.0–0.1)
Basophils Relative: 0 %
Eosinophils Absolute: 0 10*3/uL (ref 0.0–0.5)
Eosinophils Relative: 0 %
HCT: 38.6 % (ref 36.0–46.0)
Hemoglobin: 12.2 g/dL (ref 12.0–15.0)
Immature Granulocytes: 1 %
Lymphocytes Relative: 15 %
Lymphs Abs: 1.3 10*3/uL (ref 0.7–4.0)
MCH: 27.2 pg (ref 26.0–34.0)
MCHC: 31.6 g/dL (ref 30.0–36.0)
MCV: 86.2 fL (ref 80.0–100.0)
Monocytes Absolute: 1.4 10*3/uL — ABNORMAL HIGH (ref 0.1–1.0)
Monocytes Relative: 16 %
Neutro Abs: 6 10*3/uL (ref 1.7–7.7)
Neutrophils Relative %: 68 %
Platelets: 246 10*3/uL (ref 150–400)
RBC: 4.48 MIL/uL (ref 3.87–5.11)
RDW: 15.2 % (ref 11.5–15.5)
WBC: 8.8 10*3/uL (ref 4.0–10.5)
nRBC: 0 % (ref 0.0–0.2)

## 2020-02-10 LAB — OSMOLALITY, URINE: Osmolality, Ur: 307 mOsm/kg (ref 300–900)

## 2020-02-10 LAB — SODIUM, URINE, RANDOM: Sodium, Ur: 106 mmol/L

## 2020-02-10 LAB — TSH: TSH: 2.261 u[IU]/mL (ref 0.350–4.500)

## 2020-02-10 LAB — PROCALCITONIN: Procalcitonin: <0.1 ng/mL

## 2020-02-10 LAB — LACTIC ACID, PLASMA: Lactic Acid, Venous: 2 mmol/L (ref 0.5–1.9)

## 2020-02-10 LAB — OSMOLALITY: Osmolality: 271 mOsm/kg — ABNORMAL LOW (ref 275–295)

## 2020-02-10 MED ORDER — LEVETIRACETAM IN NACL 500 MG/100ML IV SOLN
500.0000 mg | INTRAVENOUS | Status: DC
  Administered 2020-02-10: 500 mg via INTRAVENOUS
  Filled 2020-02-10: qty 100

## 2020-02-10 MED ORDER — LEVETIRACETAM IN NACL 1500 MG/100ML IV SOLN
1500.0000 mg | Freq: Two times a day (BID) | INTRAVENOUS | Status: DC
  Administered 2020-02-11 – 2020-02-14 (×7): 1500 mg via INTRAVENOUS
  Filled 2020-02-10 (×7): qty 100

## 2020-02-10 MED ORDER — KETOROLAC TROMETHAMINE 30 MG/ML IJ SOLN
30.0000 mg | Freq: Once | INTRAMUSCULAR | Status: AC
  Administered 2020-02-10: 30 mg via INTRAVENOUS
  Filled 2020-02-10: qty 1

## 2020-02-10 MED ORDER — LORAZEPAM 2 MG/ML IJ SOLN
1.0000 mg | Freq: Once | INTRAMUSCULAR | Status: AC
  Administered 2020-02-10: 1 mg via INTRAVENOUS
  Filled 2020-02-10: qty 1

## 2020-02-10 MED ORDER — LEVETIRACETAM IN NACL 500 MG/100ML IV SOLN: 500.0000 mg | Freq: Two times a day (BID) | INTRAVENOUS | Status: DC

## 2020-02-10 MED ORDER — LACOSAMIDE 200 MG/20ML IV SOLN
INTRAVENOUS | Status: AC
  Filled 2020-02-10: qty 20

## 2020-02-10 MED ORDER — LEVETIRACETAM IN NACL 1500 MG/100ML IV SOLN
1500.0000 mg | Freq: Once | INTRAVENOUS | Status: AC
  Administered 2020-02-10: 1500 mg via INTRAVENOUS
  Filled 2020-02-10: qty 100

## 2020-02-10 MED ORDER — SODIUM CHLORIDE 0.9 % IV SOLN: INTRAVENOUS | Status: DC

## 2020-02-10 MED ORDER — LEVETIRACETAM IN NACL 1500 MG/100ML IV SOLN: 1500.0000 mg | Freq: Two times a day (BID) | INTRAVENOUS | Status: DC

## 2020-02-10 MED ORDER — SODIUM CHLORIDE 0.9 % IV SOLN
200.0000 mg | Freq: Two times a day (BID) | INTRAVENOUS | Status: DC
  Administered 2020-02-10 – 2020-02-14 (×8): 200 mg via INTRAVENOUS
  Filled 2020-02-10 (×10): qty 20

## 2020-02-10 MED ORDER — LACTATED RINGERS IV SOLN: INTRAVENOUS | Status: DC

## 2020-02-10 MED ORDER — LORAZEPAM 2 MG/ML IJ SOLN
1.0000 mg | INTRAMUSCULAR | Status: DC | PRN
  Filled 2020-02-10: qty 1

## 2020-02-10 MED ORDER — LABETALOL HCL 5 MG/ML IV SOLN
10.0000 mg | INTRAVENOUS | Status: DC | PRN
  Administered 2020-02-10: 10 mg via INTRAVENOUS
  Filled 2020-02-10: qty 4

## 2020-02-10 NOTE — Progress Notes (Signed)
PROGRESS NOTE    Cindy [name]  IZT:[id number] DOB: Oct 25, 1960 DOA: 02/09/2020 PCP: Avon Gully, MD   Brief Narrative:  Per HPI: Cindy [name] is a 59 y.o. female with medical history significant of LFTs abnormality history, history of multiple CVAs, hyperlipidemia, hypertension, polysubstance abuse including opioids and cocaine, seizure disorder who is brought via EMS to the emergency department after the patient started having seizures.  EMS was initially called because of family with patient being lethargic presents 24 hours or so.  When EMS arrived the patient was actively seizing and she received 5 mg of IV midazolam.  He was febrile in the emergency department without any obvious source.  She is still confused, does not follow simple commands and is unable to provide further information.  9/24: Patient was admitted with what appears to be a breakthrough seizure in the setting of positive cocaine use.  She has received Ativan as well as 1 g of IV Keppra and will be started on Keppra 500 mg IV twice daily.  EEG ordered with neurology consultation pending.  She still appears quite somnolent.  She was initially started on some antibiotics empirically, but procalcitonin is low and no other signs of infection or noted, therefore these will be discontinued.  A.m. labs ordered.  Assessment & Plan:   Principal Problem:   Seizures (HCC) Active Problems:   HYPERCHOLESTEROLEMIA   Essential hypertension   Glucose intolerance   Acute encephalopathy   Hyponatremia   SIRS (systemic inflammatory response syndrome) (HCC)   Cocaine abuse (HCC)   Acute encephalopathy secondary to breakthrough seizure -Patient currently appears postictal -Continue on Keppra IV 500 mg twice daily -EEG ordered -Seizure precautions -CT head with no acute findings -Appreciate neurology evaluation  Polysubstance abuse -Noted to be positive for benzodiazepines and cocaine -Likely related to breakthrough  seizure -Counseling on substance abuse and will need resources in outpatient setting  SIRS criteria -Currently improved with no sign of infection noted -Low procalcitonin -Discontinue antibiotics and monitor -Blood cultures with no growth noted thus far  Hyponatremia -Uncertain etiology -TSH, urine and serum osmolarity's as well as urine sodium ordered for further evaluation -Continue IV fluid with normal saline at this time  Dyslipidemia -Plan to hold statin for now until she can tolerate p.o.  Essential hypertension -Holding home amlodipine for now -We will order IV labetalol as needed for significant elevations   DVT prophylaxis:Lovenox Code Status: Full code Family Communication: Tried calling spouse on 9/24 with no response Disposition Plan:  Status is: Observation  The patient will require care spanning > 2 midnights and should be moved to inpatient because: Ongoing diagnostic testing needed not appropriate for outpatient work up, IV treatments appropriate due to intensity of illness or inability to take PO and Inpatient level of care appropriate due to severity of illness  Dispo: The patient is from: Home              Anticipated d/c is to: Home              Anticipated d/c date is: 2 days              Patient currently is not medically stable to d/c.  Patient is still quite altered and postictal and requires further evaluation and stabilization prior to discharge.  Consultants:   Neurology  Procedures:   See below, EEG pending  Antimicrobials:   None   Subjective: Patient seen and evaluated today and appears to be quite drowsy and postictal.  No acute overnight events noted.  Objective: Vitals:   02/10/20 0500 02/10/20 0600 02/10/20 0630 02/10/20 0700  BP: 132/72 (!) 142/72 126/73 117/72  Pulse: 66 71    Resp: 14 (!) 22 17 19   Temp: 99.9 F (37.7 C) 99.3 F (37.4 C) 99.1 F (37.3 C) 99 F (37.2 C)  TempSrc:      SpO2: 100% 100%    Weight:       Height:        Intake/Output Summary (Last 24 hours) at 02/10/2020 0730 Last data filed at 02/10/2020 0334 Gross per 24 hour  Intake 1000 ml  Output --  Net 1000 ml   Filed Weights   02/09/20 1959  Weight: 61.2 kg    Examination:  General exam: Appears somnolent, but arousable Respiratory system: Clear to auscultation. Respiratory effort normal.  Currently on 2 L nasal cannula oxygen Cardiovascular system: S1 & S2 heard, RRR.  Gastrointestinal system: Abdomen is nondistended, soft and nontender.  Central nervous system: Somnolent Extremities: No edema Skin: No rashes, lesions or ulcers Psychiatry: Cannot be assessed with current condition Foley present with clear, yellow urine output noted    Data Reviewed: I have personally reviewed following labs and imaging studies  CBC: Recent Labs  Lab 02/09/20 2057  WBC 14.5*  NEUTROABS 13.4*  HGB 14.2  HCT 43.9  MCV 85.7  PLT 311   Basic Metabolic Panel: Recent Labs  Lab 02/09/20 2057 02/10/20 0450  NA 127* 128*  K 3.5 4.0  CL 96* 100  CO2 19* 17*  GLUCOSE 134* 110*  BUN 10 10  CREATININE 1.08* 0.94  CALCIUM 9.2 8.2*   GFR: Estimated Creatinine Clearance: 61.5 mL/min (by C-G formula based on SCr of 0.94 mg/dL). Liver Function Tests: Recent Labs  Lab 02/09/20 2057 02/10/20 0450  AST 27 27  ALT 16 14  ALKPHOS 94 71  BILITOT 0.8 1.3*  PROT 8.7* 6.9  ALBUMIN 3.9 3.0*   No results for input(s): LIPASE, AMYLASE in the last 168 hours. No results for input(s): AMMONIA in the last 168 hours. Coagulation Profile: Recent Labs  Lab 02/09/20 2057  INR 1.0   Cardiac Enzymes: No results for input(s): CKTOTAL, CKMB, CKMBINDEX, TROPONINI in the last 168 hours. BNP (last 3 results) No results for input(s): PROBNP in the last 8760 hours. HbA1C: No results for input(s): HGBA1C in the last 72 hours. CBG: No results for input(s): GLUCAP in the last 168 hours. Lipid Profile: No results for input(s): CHOL, HDL,  LDLCALC, TRIG, CHOLHDL, LDLDIRECT in the last 72 hours. Thyroid Function Tests: No results for input(s): TSH, T4TOTAL, FREET4, T3FREE, THYROIDAB in the last 72 hours. Anemia Panel: No results for input(s): VITAMINB12, FOLATE, FERRITIN, TIBC, IRON, RETICCTPCT in the last 72 hours. Sepsis Labs: Recent Labs  Lab 02/09/20 2057 02/10/20 0105 02/10/20 0450  PROCALCITON  --   --  <0.10  LATICACIDVEN 2.2* 2.0*  --     Recent Results (from the past 240 hour(s))  Respiratory Panel by RT PCR (Flu A&B, Covid) -     Status: None   Collection Time: 02/09/20  8:21 PM   Specimen: Nasopharyngeal  Result Value Ref Range Status   SARS Coronavirus 2 by RT PCR NEGATIVE NEGATIVE Final    Comment: (NOTE) SARS-CoV-2 target nucleic acids are NOT DETECTED.  The SARS-CoV-2 RNA is generally detectable in upper respiratoy specimens during the acute phase of infection. The lowest concentration of SARS-CoV-2 viral copies this assay can detect is 131 copies/mL. A  negative result does not preclude SARS-Cov-2 infection and should not be used as the sole basis for treatment or other patient management decisions. A negative result may occur with  improper specimen collection/handling, submission of specimen other than nasopharyngeal swab, presence of viral mutation(s) within the areas targeted by this assay, and inadequate number of viral copies (<131 copies/mL). A negative result must be combined with clinical observations, patient history, and epidemiological information. The expected result is Negative.  Fact Sheet for Patients:  https://www.moore.com/https://www.fda.gov/media/142436/download  Fact Sheet for Healthcare Providers:  https://www.young.biz/https://www.fda.gov/media/142435/download  This test is no t yet approved or cleared by the [place] FDA and  has been authorized for detection and/or diagnosis of SARS-CoV-2 by FDA under an Emergency Use Authorization (EUA). This EUA will remain  in effect (meaning this test can be used) for  the duration of the COVID-19 declaration under Section 564(b)(1) of the Act, 21 U.S.C. section 360bbb-3(b)(1), unless the authorization is terminated or revoked sooner.     Influenza A by PCR NEGATIVE NEGATIVE Final   Influenza B by PCR NEGATIVE NEGATIVE Final    Comment: (NOTE) The Xpert Xpress SARS-CoV-2/FLU/RSV assay is intended as an aid in  the diagnosis of influenza from Nasopharyngeal swab specimens and  should not be used as a sole basis for treatment. Nasal washings and  aspirates are unacceptable for Xpert Xpress SARS-CoV-2/FLU/RSV  testing.  Fact Sheet for Patients: https://www.moore.com/https://www.fda.gov/media/142436/download  Fact Sheet for Healthcare Providers: https://www.young.biz/https://www.fda.gov/media/142435/download  This test is not yet approved or cleared by the [place] FDA and  has been authorized for detection and/or diagnosis of SARS-CoV-2 by  FDA under an Emergency Use Authorization (EUA). This EUA will remain  in effect (meaning this test can be used) for the duration of the  Covid-19 declaration under Section 564(b)(1) of the Act, 21  U.S.C. section 360bbb-3(b)(1), unless the authorization is  terminated or revoked. Performed at [facility], [address] [place], [place] [address]   Blood Culture (routine x 2)     Status: None (Preliminary result)   Collection Time: 02/09/20  8:52 PM   Specimen: BLOOD  Result Value Ref Range Status   Specimen Description BLOOD BLOOD RIGHT ARM  Final   Special Requests   Final    BOTTLES DRAWN AEROBIC AND ANAEROBIC Blood Culture adequate volume   Culture   Final    NO GROWTH < 12 HOURS Performed at [facility], [address] [place], [place] [address]    Report Status PENDING  Incomplete  Blood Culture (routine x 2)     Status: None (Preliminary result)   Collection Time: 02/09/20  8:57 PM   Specimen: BLOOD RIGHT HAND  Result Value Ref Range Status   Specimen Description BLOOD RIGHT HAND  Final   Special Requests   Final    BOTTLES DRAWN  AEROBIC AND ANAEROBIC Blood Culture adequate volume   Culture   Final    NO GROWTH < 12 HOURS Performed at [facility], [address] [place], [place] [address]    Report Status PENDING  Incomplete         Radiology Studies: CT Head Wo Contrast  Result Date: 02/09/2020 CLINICAL DATA:  Cerebral hemorrhage suspected Lethargy with seizure. EXAM: CT HEAD WITHOUT CONTRAST TECHNIQUE: Contiguous axial images were obtained from the base of the skull through the vertex without intravenous contrast. COMPARISON:  Head CT 08/05/2019, brain MRI 08/10/2019 FINDINGS: Brain: No acute hemorrhage. Generalized atrophy, mild but age advanced. Advanced periventricular white matter hypodensity consistent  with chronic small vessel ischemia, particularly advanced for age. Lacunar infarct in the right basal ganglia. Remote but new from March 2021. Additional tiny bilateral basal gangliar lacunar infarcts are unchanged. No evidence of acute ischemia. No subdural or extra-axial collection. Vascular: Atherosclerosis of skullbase vasculature without hyperdense vessel or abnormal calcification. Skull: No fracture or focal lesion. Sinuses/Orbits: Fluid level with frothy debris in the left side of sphenoid sinus. Remaining paranasal sinuses are clear. No mastoid effusion. Orbits are unremarkable. Other: None. IMPRESSION: 1. No acute intracranial abnormality. 2. Generalized atrophy and chronic small vessel ischemia similar to prior exam but advanced for age. Lacunar infarct in the right basal ganglia appears remote, but new from March 2021. 3. Fluid level with frothy debris in the left side of the sphenoid sinus, can be seen with acute sinusitis. Electronically Signed   By: Narda Rutherford M.D.   On: 02/09/2020 20:36   DG Chest Port 1 View  Result Date: 02/09/2020 CLINICAL DATA:  Weakness. EXAM: PORTABLE CHEST 1 VIEW COMPARISON:  08/07/2019 FINDINGS: Lung volumes are low. Upper normal heart size likely accentuated by low lung  volumes. Unchanged mediastinal contours. No acute or focal airspace disease. No pleural effusion or pneumothorax. No pulmonary edema. Chronic right AC joint separation. Bones are under mineralized. No acute osseous abnormalities are seen. IMPRESSION: Low lung volumes without acute abnormality. Electronically Signed   By: Narda Rutherford M.D.   On: 02/09/2020 20:37        Scheduled Meds: . enoxaparin (LOVENOX) injection  40 mg Subcutaneous Q24H   Continuous Infusions: . 0.9 % NaCl with KCl 20 mEq / L 100 mL/hr at 02/10/20 0131  . ceFEPime (MAXIPIME) IV 2 g (02/10/20 0458)  . vancomycin       LOS: 0 days    Time spent: 35 minutes    Osie Amparo Hoover Brunette, DO [facility]  If 7PM-7AM, please contact night-coverage [email or web address] 02/10/2020, 7:30 AM

## 2020-02-10 NOTE — Consult Note (Addendum)
[facility] [name] A. Gerilyn Pilgrim, MD     [email or web address]          Cindy [name] is an 59 y.o. female.   ASSESSMENT/PLAN: 1.  Status epilepticus: The patient was initially written for an increased dose of her Keppra to 1500 mg twice a day.  However EEG showed high levels of epileptiform discharges raising the likelihood that she is still could be having continuous seizure activity/nonconvulsive status epilepticus.  Consequently, she was given another 1500 mg of bolus dose of Keppra.  I will also start the patient on Vimpat.  Repeat EEG is recommended tomorrow especially if not improved.  If not available, the patient should be considered for transfer to [facility] where EEG can be done. 2.  Altered mental status likely due to multiple seizures/status epilepticus 3.  Polysubstance drug abuse including alcohol and cocaine 4.  Previous lacunar infarcts 5.  Hypertension 6.  Extensive chronic microvascular changes on imaging which increases the likelihood of marked cognitive impairment and gait impairment long-term.   This is a 59 year old white female who presents with the altered mental status for the last several hours.  This was noted by her family.  She subsequently was noted to have convulsive seizure.  She had these back to back and was taken to the hospital for further evaluation.  The patient she had a few of these events and was given multiple doses of Ativan and loaded with the size milligrams of Keppra.  The patient presented with the similar findings in May of this year.  She was started on Keppra at that time.  She gradually improved.  There was some discussion that the seizures possibly could be due to alcohol withdrawal at that time.  She was prescribed Keppra XR 1000 mg a day.  It is presumed that she was compliant with this although this has not been verified.  The patient is no longer having convulsive seizures but is quite stuporous and confused during the evaluation.  She  cannot provide a history.    GENERAL: The patient is laying in bed with eyes closed under the covers.  HEENT: Mild tongue bite trauma noted on each side.  Neck is supple.  ABDOMEN: soft  EXTREMITIES: No edema   BACK: Normal  SKIN: Normal by inspection.    MENTAL STATUS: She lays in bed with eyes closed.  She does open her eyes to verbal commands.  She does follow midline commands and occasionally appendicular commands with prompting. She perseverates on yes doing well.   CRANIAL NERVES: Pupils are equal, round and reactive to light; extra ocular movements are full, there is no significant nystagmus; visual fields are full; upper and lower facial muscles are normal in strength and symmetric, there is no flattening of the nasolabial folds; tongue is midline; uvula is midline; shoulder elevation is normal.  MOTOR: Normal ton and bulk are normal; strength at least 4/5 arms and 3/5 legs; no pronator drift.  COORDINATION: No tremor, dysmetria or parkinsonism.   REFLEXES: Deep tendon reflexes are symmetrical and normal.   SENSATION: Normal to pain.      Blood pressure (!) 149/77, pulse (!) 55, temperature 97.7 F (36.5 C), resp. rate 15, height 5\' 7"  (1.702 m), weight 61.2 kg, SpO2 100 %.  Past Medical History:  Diagnosis Date  . Abnormal LFTs    Hx of  . CVA (cerebral vascular accident) (HCC) 2005  . Hyperlipidemia   . Hypertension   . Polysubstance dependence including opioid type  drug, episodic abuse (HCC)    cocaine/etoh     Past Surgical History:  Procedure Laterality Date  . ESOPHAGOGASTRODUODENOSCOPY N/A 08/16/2019   Procedure: ESOPHAGOGASTRODUODENOSCOPY (EGD);  Surgeon: Vida Rigger, MD;  Location: [facility] ENDOSCOPY;  Service: Endoscopy;  Laterality: N/A;    Family History  Problem Relation Age of Onset  . Cancer Father        prostate   . Hypertension Mother   . Asthma Mother   . Bronchitis Mother     Social History:  reports that she has never smoked. She has  never used smokeless tobacco. She reports current alcohol use. She reports that she does not use drugs.  Allergies: No Known Allergies  Medications: Prior to Admission medications   Medication Sig Start Date End Date Taking? Authorizing Provider  amLODipine (NORVASC) 10 MG tablet TAKE 1 TABLET BY MOUTH EVERY DAY (NEEDS TO MAKE AN APPOINTMENT) 08/26/19   Angiulli, Mcarthur Rossetti, PA-C  clopidogrel (PLAVIX) 75 MG tablet Take 1 tablet (75 mg total) by mouth daily. 08/26/19   Angiulli, Mcarthur Rossetti, PA-C  levETIRAcetam (KEPPRA XR) 500 MG 24 hr tablet Take 1 tablet (500 mg total) by mouth daily. 08/26/19   Angiulli, Mcarthur Rossetti, PA-C  Multiple Vitamin (MULTIVITAMIN) LIQD Take 15 mLs by mouth daily. 08/20/19   Amin, Loura Halt, MD  pantoprazole (PROTONIX) 40 MG tablet Take 1 tablet (40 mg total) by mouth 2 (two) times daily before a meal. 08/26/19   Angiulli, Mcarthur Rossetti, PA-C  QUEtiapine (SEROQUEL) 25 MG tablet Take 1 tablet (25 mg total) by mouth at bedtime. 08/26/19   Angiulli, Mcarthur Rossetti, PA-C  sennosides (SENOKOT) 8.8 MG/5ML syrup Place 5 mLs into feeding tube 2 (two) times daily as needed for mild constipation. 08/19/19   Amin, Loura Halt, MD  simvastatin (ZOCOR) 20 MG tablet Take 1 tablet (20 mg total) by mouth daily. 08/26/19   Angiulli, Mcarthur Rossetti, PA-C    Scheduled Meds: . enoxaparin (LOVENOX) injection  40 mg Subcutaneous Q24H   Continuous Infusions: . sodium chloride 75 mL/hr at 02/10/20 1211  . levETIRAcetam     PRN Meds:.acetaminophen **OR** acetaminophen, LORazepam, ondansetron **OR** ondansetron (ZOFRAN) IV     Results for orders placed or performed during the hospital encounter of 02/09/20 (from the past 48 hour(s))  Respiratory Panel by RT PCR (Flu A&B, Covid) -     Status: None   Collection Time: 02/09/20  8:21 PM   Specimen: Nasopharyngeal  Result Value Ref Range   SARS Coronavirus 2 by RT PCR NEGATIVE NEGATIVE    Comment: (NOTE) SARS-CoV-2 target nucleic acids are NOT DETECTED.  The SARS-CoV-2  RNA is generally detectable in upper respiratoy specimens during the acute phase of infection. The lowest concentration of SARS-CoV-2 viral copies this assay can detect is 131 copies/mL. A negative result does not preclude SARS-Cov-2 infection and should not be used as the sole basis for treatment or other patient management decisions. A negative result may occur with  improper specimen collection/handling, submission of specimen other than nasopharyngeal swab, presence of viral mutation(s) within the areas targeted by this assay, and inadequate number of viral copies (<131 copies/mL). A negative result must be combined with clinical observations, patient history, and epidemiological information. The expected result is Negative.  Fact Sheet for Patients:  https://www.moore.com/  Fact Sheet for Healthcare Providers:  https://www.young.biz/  This test is no t yet approved or cleared by the [place] FDA and  has been authorized for detection and/or diagnosis of SARS-CoV-2 by  FDA under an Emergency Use Authorization (EUA). This EUA will remain  in effect (meaning this test can be used) for the duration of the COVID-19 declaration under Section 564(b)(1) of the Act, 21 U.S.C. section 360bbb-3(b)(1), unless the authorization is terminated or revoked sooner.     Influenza A by PCR NEGATIVE NEGATIVE   Influenza B by PCR NEGATIVE NEGATIVE    Comment: (NOTE) The Xpert Xpress SARS-CoV-2/FLU/RSV assay is intended as an aid in  the diagnosis of influenza from Nasopharyngeal swab specimens and  should not be used as a sole basis for treatment. Nasal washings and  aspirates are unacceptable for Xpert Xpress SARS-CoV-2/FLU/RSV  testing.  Fact Sheet for Patients: https://www.moore.com/  Fact Sheet for Healthcare Providers: https://www.young.biz/  This test is not yet approved or cleared by the [place] FDA  and  has been authorized for detection and/or diagnosis of SARS-CoV-2 by  FDA under an Emergency Use Authorization (EUA). This EUA will remain  in effect (meaning this test can be used) for the duration of the  Covid-19 declaration under Section 564(b)(1) of the Act, 21  U.S.C. section 360bbb-3(b)(1), unless the authorization is  terminated or revoked. Performed at [facility], [address] [place], [place] [address]   Blood Culture (routine x 2)     Status: None (Preliminary result)   Collection Time: 02/09/20  8:52 PM   Specimen: BLOOD  Result Value Ref Range   Specimen Description BLOOD BLOOD RIGHT ARM    Special Requests      BOTTLES DRAWN AEROBIC AND ANAEROBIC Blood Culture adequate volume   Culture      NO GROWTH < 12 HOURS Performed at [facility], [address], [place], [place] [address]    Report Status PENDING   CBC with Differential/Platelet     Status: Abnormal   Collection Time: 02/09/20  8:57 PM  Result Value Ref Range   WBC 14.5 (H) 4.0 - 10.5 K/uL   RBC 5.12 (H) 3.87 - 5.11 MIL/uL   Hemoglobin 14.2 12.0 - 15.0 g/dL   HCT 09.8 36 - 46 %   MCV 85.7 80.0 - 100.0 fL   MCH 27.7 26.0 - 34.0 pg   MCHC 32.3 30.0 - 36.0 g/dL   RDW 11.9 14.7 - 82.9 %   Platelets 311 150 - 400 K/uL   nRBC 0.0 0.0 - 0.2 %   Neutrophils Relative % 93 %   Neutro Abs 13.4 (H) 1.7 - 7.7 K/uL   Lymphocytes Relative 3 %   Lymphs Abs 0.5 (L) 0.7 - 4.0 K/uL   Monocytes Relative 4 %   Monocytes Absolute 0.6 0 - 1 K/uL   Eosinophils Relative 0 %   Eosinophils Absolute 0.0 0 - 0 K/uL   Basophils Relative 0 %   Basophils Absolute 0.0 0 - 0 K/uL   Immature Granulocytes 0 %   Abs Immature Granulocytes 0.06 0.00 - 0.07 K/uL    Comment: Performed at [facility], [address], [place], [place] [address]  Comprehensive metabolic panel     Status: Abnormal   Collection Time: 02/09/20  8:57 PM  Result Value Ref Range   Sodium 127 (L) 135 - 145 mmol/L   Potassium 3.5 3.5 - 5.1 mmol/L    Chloride 96 (L) 98 - 111 mmol/L   CO2 19 (L) 22 - 32 mmol/L   Glucose, Bld 134 (H) 70 - 99 mg/dL    Comment: Glucose reference range applies only to samples taken after fasting for  at least 8 hours.   BUN 10 6 - 20 mg/dL   Creatinine, Ser 1.61 (H) 0.44 - 1.00 mg/dL   Calcium 9.2 8.9 - 09.6 mg/dL   Total Protein 8.7 (H) 6.5 - 8.1 g/dL   Albumin 3.9 3.5 - 5.0 g/dL   AST 27 15 - 41 U/L   ALT 16 0 - 44 U/L   Alkaline Phosphatase 94 38 - 126 U/L   Total Bilirubin 0.8 0.3 - 1.2 mg/dL   GFR calc non Af Amer 56 (L) >60 mL/min   GFR calc Af Amer >60 >60 mL/min   Anion gap 12 5 - 15    Comment: Performed at [facility], [address], [place], [place] [address]  Lactic acid, plasma     Status: Abnormal   Collection Time: 02/09/20  8:57 PM  Result Value Ref Range   Lactic Acid, Venous 2.2 (HH) 0.5 - 1.9 mmol/L    Comment: CRITICAL RESULT CALLED TO, READ BACK BY AND VERIFIED WITH: BLEVINS,Z ON 02/09/20 AT 2225 BY LOY,C Performed at [facility], [address] [place], [place] [address]   Ethanol     Status: None   Collection Time: 02/09/20  8:57 PM  Result Value Ref Range   Alcohol, Ethyl (B) <10 <10 mg/dL    Comment: (NOTE) Lowest detectable limit for serum alcohol is 10 mg/dL.  For medical purposes only. Performed at [facility], [address], [place], [place] [address]   Protime-INR     Status: None   Collection Time: 02/09/20  8:57 PM  Result Value Ref Range   Prothrombin Time 12.9 11.4 - 15.2 seconds   INR 1.0 0.8 - 1.2    Comment: (NOTE) INR goal varies based on device and disease states. Performed at [facility], [address] [place], [place] [address]   APTT     Status: None   Collection Time: 02/09/20  8:57 PM  Result Value Ref Range   aPTT 25 24 - 36 seconds    Comment: Performed at [facility], [address], [place], [place] [address]  Blood Culture (routine x 2)     Status: None (Preliminary result)   Collection Time: 02/09/20  8:57 PM   Specimen: BLOOD  RIGHT HAND  Result Value Ref Range   Specimen Description BLOOD RIGHT HAND    Special Requests      BOTTLES DRAWN AEROBIC AND ANAEROBIC Blood Culture adequate volume   Culture      NO GROWTH < 12 HOURS Performed at [facility], [address] [place], [place] [address]    Report Status PENDING   Urinalysis, Routine w reflex microscopic     Status: Abnormal   Collection Time: 02/09/20  9:11 PM  Result Value Ref Range   Color, Urine YELLOW YELLOW   APPearance CLEAR CLEAR   Specific Gravity, Urine 1.013 1.005 - 1.030   pH 7.0 5.0 - 8.0   Glucose, UA NEGATIVE NEGATIVE mg/dL   Hgb urine dipstick SMALL (A) NEGATIVE   Bilirubin Urine NEGATIVE NEGATIVE   Ketones, ur NEGATIVE NEGATIVE mg/dL   Protein, ur 629 (A) NEGATIVE mg/dL   Nitrite NEGATIVE NEGATIVE   Leukocytes,Ua NEGATIVE NEGATIVE   RBC / HPF 0-5 0 - 5 RBC/hpf   WBC, UA 0-5 0 - 5 WBC/hpf   Bacteria, UA RARE (A) NONE SEEN   Squamous Epithelial / LPF 0-5 0 - 5    Comment: Performed at [facility], [address] [place], [place] [address]  Rapid urine drug screen (hospital performed)     Status: Abnormal   Collection Time: 02/09/20  9:11 PM  Result Value Ref Range   Opiates NONE DETECTED NONE DETECTED   Cocaine POSITIVE (A) NONE DETECTED   Benzodiazepines POSITIVE (A) NONE DETECTED   Amphetamines NONE DETECTED NONE DETECTED   Tetrahydrocannabinol NONE DETECTED NONE DETECTED   Barbiturates NONE DETECTED NONE DETECTED    Comment: (NOTE) DRUG SCREEN FOR MEDICAL PURPOSES ONLY.  IF CONFIRMATION IS NEEDED FOR ANY PURPOSE, NOTIFY LAB WITHIN 5 DAYS.  LOWEST DETECTABLE LIMITS FOR URINE DRUG SCREEN Drug Class                     Cutoff (ng/mL) Amphetamine and metabolites    1000 Barbiturate and metabolites    200 Benzodiazepine                 200 Tricyclics and metabolites     300 Opiates and metabolites        300 Cocaine and metabolites        300 THC                            50 Performed at [facility], [address], [place], [place] [address]   POC urine preg, ED     Status: None   Collection Time: 02/09/20  9:13 PM  Result Value Ref Range   Preg Test, Ur NEGATIVE NEGATIVE    Comment:        THE SENSITIVITY OF THIS METHODOLOGY IS >24 mIU/mL   Lactic acid, plasma     Status: Abnormal   Collection Time: 02/10/20  1:05 AM  Result Value Ref Range   Lactic Acid, Venous 2.0 (HH) 0.5 - 1.9 mmol/L    Comment: CRITICAL VALUE NOTED.  VALUE IS CONSISTENT WITH PREVIOUSLY REPORTED AND CALLED VALUE. Performed at [facility], [address], [place], [place] [address]   Comprehensive metabolic panel     Status: Abnormal   Collection Time: 02/10/20  4:50 AM  Result Value Ref Range   Sodium 128 (L) 135 - 145 mmol/L   Potassium 4.0 3.5 - 5.1 mmol/L   Chloride 100 98 - 111 mmol/L   CO2 17 (L) 22 - 32 mmol/L   Glucose, Bld 110 (H) 70 - 99 mg/dL    Comment: Glucose reference range applies only to samples taken after fasting for at least 8 hours.   BUN 10 6 - 20 mg/dL   Creatinine, Ser 5.62 0.44 - 1.00 mg/dL   Calcium 8.2 (L) 8.9 - 10.3 mg/dL   Total Protein 6.9 6.5 - 8.1 g/dL   Albumin 3.0 (L) 3.5 - 5.0 g/dL   AST 27 15 - 41 U/L   ALT 14 0 - 44 U/L   Alkaline Phosphatase 71 38 - 126 U/L   Total Bilirubin 1.3 (H) 0.3 - 1.2 mg/dL   GFR calc non Af Amer >60 >60 mL/min   GFR calc Af Amer >60 >60 mL/min   Anion gap 11 5 - 15    Comment: Performed at [facility], [address] [place], [place] [address]  Procalcitonin     Status: None   Collection Time: 02/10/20  4:50 AM  Result Value Ref Range   Procalcitonin <0.10 ng/mL    Comment:        Interpretation: PCT (Procalcitonin) <= 0.5 ng/mL: Systemic infection (sepsis) is not likely. Local bacterial infection  is possible. (NOTE)       Sepsis PCT Algorithm           Lower Respiratory Tract                                      Infection PCT Algorithm    ----------------------------     ----------------------------         PCT < 0.25 ng/mL                 PCT < 0.10 ng/mL          Strongly encourage             Strongly discourage   discontinuation of antibiotics    initiation of antibiotics    ----------------------------     -----------------------------       PCT 0.25 - 0.50 ng/mL            PCT 0.10 - 0.25 ng/mL               OR       >80% decrease in PCT            Discourage initiation of                                            antibiotics      Encourage discontinuation           of antibiotics    ----------------------------     -----------------------------         PCT >= 0.50 ng/mL              PCT 0.26 - 0.50 ng/mL               AND        <80% decrease in PCT             Encourage initiation of                                             antibiotics       Encourage continuation           of antibiotics    ----------------------------     -----------------------------        PCT >= 0.50 ng/mL                  PCT > 0.50 ng/mL               AND         increase in PCT                  Strongly encourage                                      initiation of antibiotics    Strongly encourage escalation           of antibiotics                                     -----------------------------  PCT <= 0.25 ng/mL                                                 OR                                        > 80% decrease in PCT                                      Discontinue / Do not initiate                                             antibiotics  Performed at [facility], [address] [place], [place] [address]   Osmolality, urine     Status: None   Collection Time: 02/10/20  8:00 AM  Result Value Ref Range   Osmolality, Ur 307 300 - 900 mOsm/kg    Comment: Performed at [facility], [address] [place], [place] [address]  Sodium, urine, random     Status: None   Collection Time: 02/10/20  8:07 AM  Result Value Ref Range   Sodium, Ur 106 mmol/L    Comment: Performed  at [facility], [address] [place], [place] [address]  CBC with Differential/Platelet     Status: Abnormal   Collection Time: 02/10/20  8:23 AM  Result Value Ref Range   WBC 8.8 4.0 - 10.5 K/uL   RBC 4.48 3.87 - 5.11 MIL/uL   Hemoglobin 12.2 12.0 - 15.0 g/dL   HCT 91.438.6 36 - 46 %   MCV 86.2 80.0 - 100.0 fL   MCH 27.2 26.0 - 34.0 pg   MCHC 31.6 30.0 - 36.0 g/dL   RDW 78.215.2 95.611.5 - 21.315.5 %   Platelets 246 150 - 400 K/uL   nRBC 0.0 0.0 - 0.2 %   Neutrophils Relative % 68 %   Neutro Abs 6.0 1.7 - 7.7 K/uL   Lymphocytes Relative 15 %   Lymphs Abs 1.3 0.7 - 4.0 K/uL   Monocytes Relative 16 %   Monocytes Absolute 1.4 (H) 0 - 1 K/uL   Eosinophils Relative 0 %   Eosinophils Absolute 0.0 0 - 0 K/uL   Basophils Relative 0 %   Basophils Absolute 0.0 0 - 0 K/uL   Immature Granulocytes 1 %   Abs Immature Granulocytes 0.05 0.00 - 0.07 K/uL    Comment: Performed at [facility], [address] [place], [place] [address]  TSH     Status: None   Collection Time: 02/10/20  8:23 AM  Result Value Ref Range   TSH 2.261 0.350 - 4.500 uIU/mL    Comment: Performed by a 3rd Generation assay with a functional sensitivity of <=0.01 uIU/mL. Performed at [facility], [address] [place], [place] [address]   Osmolality     Status: Abnormal   Collection Time: 02/10/20  8:23 AM  Result Value Ref Range   Osmolality 271 (L) 275 - 295 mOsm/kg    Comment: Performed at [facility], [address], [place], [place] [address]    Studies/Results:  BRAIN MRI 07-2019 FINDINGS: Brain: Scattered small foci of restricted diffusion are seen within the bilateral cerebral hemispheres, consistent with small acute infarcts, likely embolic.   No hemorrhage, hydrocephalus, extra-axial collection or mass lesion.   Scattered confluent foci of T2 hyperintensity are seen within the white matter of the cerebral hemispheres, within the thalami, pons and cerebellum, nonspecific remote lacunar infarcts are seen  in the bilateral basal ganglia, bilateral centrum semiovale and corona radiata at the, right middle cerebellar peduncle and left cerebellar hemisphere. Findings are more pronounced than expected for age.   Prominence of the cerebral sulci and ventricular system reflecting parenchymal volume loss.   Vascular: Normal flow voids. Single A2 from incidentally noted   Skull and upper cervical spine: Normal marrow signal.   Sinuses/Orbits: Mucosal thickening of the maxillary sinuses, sphenoid sinuses and ethmoid cells. The orbits are maintained.   Other: Bilateral mastoid effusions.   IMPRESSION: 1. Scattered small foci of restricted diffusion within the bilateral cerebral hemispheres, consistent with small acute infarcts, likely embolic. 2. Advanced white matter disease, more pronounced than expected for age. 3. Mild inflammatory paranasal sinus disease and bilateral mastoid Effusions.    HEAD CT 02-09-2020 FINDINGS: Brain: No acute hemorrhage. Generalized atrophy, mild but age advanced. Advanced periventricular white matter hypodensity consistent with chronic small vessel ischemia, particularly advanced for age. Lacunar infarct in the right basal ganglia. Remote but new from March 2021. Additional tiny bilateral basal gangliar lacunar infarcts are unchanged. No evidence of acute ischemia. No subdural or extra-axial collection.   Vascular: Atherosclerosis of skullbase vasculature without hyperdense vessel or abnormal calcification.   Skull: No fracture or focal lesion.   Sinuses/Orbits: Fluid level with frothy debris in the left side of sphenoid sinus. Remaining paranasal sinuses are clear. No mastoid effusion. Orbits are unremarkable.   Other: None.   IMPRESSION: 1. No acute intracranial abnormality. 2. Generalized atrophy and chronic small vessel ischemia similar to prior exam but advanced for age. Lacunar infarct in the right basal ganglia appears remote, but new from  March 2021. 3. Fluid level with frothy debris in the left side of the sphenoid sinus, can be seen with acute sinusitis.     Both the head CT scan in the brain scan are reviewed. Brain MRI scan was done in March of this year. 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 Remote infarct is noted right basal ganglia on CT not seen on previous imaging. There is also remote infarct left basal ganglia seen on the MRI from March.  This is a critical patient who is in status epilepticus requiring multiple discussion with the referring providers and staff in coordination of care. Critical care time 90 minutes.      Pascal Stiggers A. Gerilyn Pilgrim, M.D.  Diplomate, Biomedical engineer of Psychiatry and Neurology ( Neurology). 02/10/2020, 3:12 PM

## 2020-02-10 NOTE — Procedures (Signed)
Patient Name: Cindy [name]  MRN: [id number]  Epilepsy Attending: Charlsie Quest  Referring Physician/Provider: Dr Maurilio Lovely Date: 02/10/2020 Duration: 23.29 mins  Patient history: 59yo F with breakthrough seizure. EEG to evaluate for seizure.  Level of alertness:  lethargic  AEDs during EEG study: LEV  Technical aspects: This EEG study was done with scalp electrodes positioned according to the 10-20 International system of electrode placement. Electrical activity was acquired at a sampling rate of 500Hz  and reviewed with a high frequency filter of 70Hz  and a low frequency filter of 1Hz . EEG data were recorded continuously and digitally stored.   Description: No posterior dominant rhythm was seen. EEG showed continuous 3 to 6 Hz theta-delta slowing in left hemisphere as well as disorganised 6-9z theta-alpha activity in right hemisphere. Periodic epileptiform discharges were seen in left hemisphere, maximal left posterior quadrant at 1Hz .  Hyperventilation and photic stimulation were not performed.     ABNORMALITY -Continued slow, lateralized left hemisphere  -Periodic epileptiform discharges, left hemisphere, maximal left posterior quadrant  IMPRESSION: This study showed evidence of epileptogenicity and cortical dysfunction arising from left hemisphere, maximal left posterior quadrant suggestive of underlying structural abnormality. This pattern is on the ictal-interictal continuum with higher potential for seizure recurrence. Additionally, there is evidence of severe diffuse encephalopathy, non specific etiology but could be secondary to sedation, seizure, post-ictal state. No seizures were seen throughout the recording.  Gael Delude 

## 2020-02-10 NOTE — Progress Notes (Signed)
EEG Completed; Results Pending  

## 2020-02-11 LAB — CBC WITH DIFFERENTIAL/PLATELET
Abs Immature Granulocytes: 0.02 10*3/uL (ref 0.00–0.07)
Basophils Absolute: 0.1 10*3/uL (ref 0.0–0.1)
Basophils Relative: 1 %
Eosinophils Absolute: 0 10*3/uL (ref 0.0–0.5)
Eosinophils Relative: 1 %
HCT: 35.3 % — ABNORMAL LOW (ref 36.0–46.0)
Hemoglobin: 11.4 g/dL — ABNORMAL LOW (ref 12.0–15.0)
Immature Granulocytes: 0 %
Lymphocytes Relative: 10 %
Lymphs Abs: 0.8 10*3/uL (ref 0.7–4.0)
MCH: 27.7 pg (ref 26.0–34.0)
MCHC: 32.3 g/dL (ref 30.0–36.0)
MCV: 85.7 fL (ref 80.0–100.0)
Monocytes Absolute: 0.9 10*3/uL (ref 0.1–1.0)
Monocytes Relative: 11 %
Neutro Abs: 6.5 10*3/uL (ref 1.7–7.7)
Neutrophils Relative %: 77 %
Platelets: 250 10*3/uL (ref 150–400)
RBC: 4.12 MIL/uL (ref 3.87–5.11)
RDW: 15.3 % (ref 11.5–15.5)
WBC: 8.3 10*3/uL (ref 4.0–10.5)
nRBC: 0 % (ref 0.0–0.2)

## 2020-02-11 LAB — COMPREHENSIVE METABOLIC PANEL
ALT: 13 U/L (ref 0–44)
AST: 22 U/L (ref 15–41)
Albumin: 2.6 g/dL — ABNORMAL LOW (ref 3.5–5.0)
Alkaline Phosphatase: 56 U/L (ref 38–126)
Anion gap: 11 (ref 5–15)
BUN: 10 mg/dL (ref 6–20)
CO2: 20 mmol/L — ABNORMAL LOW (ref 22–32)
Calcium: 8.4 mg/dL — ABNORMAL LOW (ref 8.9–10.3)
Chloride: 100 mmol/L (ref 98–111)
Creatinine, Ser: 0.88 mg/dL (ref 0.44–1.00)
GFR calc Af Amer: >60 mL/min (ref >60)
GFR calc non Af Amer: >60 mL/min (ref >60)
Glucose, Bld: 101 mg/dL — ABNORMAL HIGH (ref 70–99)
Potassium: 3.2 mmol/L — ABNORMAL LOW (ref 3.5–5.1)
Sodium: 131 mmol/L — ABNORMAL LOW (ref 135–145)
Total Bilirubin: 1 mg/dL (ref 0.3–1.2)
Total Protein: 5.8 g/dL — ABNORMAL LOW (ref 6.5–8.1)

## 2020-02-11 LAB — MAGNESIUM: Magnesium: 1.5 mg/dL — ABNORMAL LOW (ref 1.7–2.4)

## 2020-02-11 LAB — URINE CULTURE: Culture: NO GROWTH

## 2020-02-11 MED ORDER — POTASSIUM CHLORIDE CRYS ER 20 MEQ PO TBCR
40.0000 meq | EXTENDED_RELEASE_TABLET | Freq: Once | ORAL | Status: AC
  Administered 2020-02-11: 40 meq via ORAL
  Filled 2020-02-11: qty 2

## 2020-02-11 MED ORDER — MAGNESIUM SULFATE 2 GM/50ML IV SOLN
2.0000 g | Freq: Once | INTRAVENOUS | Status: AC
  Administered 2020-02-11: 2 g via INTRAVENOUS
  Filled 2020-02-11: qty 50

## 2020-02-11 MED ORDER — AMLODIPINE BESYLATE 5 MG PO TABS
10.0000 mg | ORAL_TABLET | Freq: Every day | ORAL | Status: DC
  Administered 2020-02-11 – 2020-02-17 (×7): 10 mg via ORAL
  Filled 2020-02-11 (×7): qty 2

## 2020-02-11 MED ORDER — CHLORHEXIDINE GLUCONATE CLOTH 2 % EX PADS
6.0000 | MEDICATED_PAD | Freq: Every day | CUTANEOUS | Status: DC
  Administered 2020-02-15 – 2020-02-16 (×2): 6 via TOPICAL

## 2020-02-11 MED ORDER — LORAZEPAM 2 MG/ML IJ SOLN
1.0000 mg | INTRAMUSCULAR | Status: DC | PRN
  Administered 2020-02-11 – 2020-02-17 (×6): 1 mg via INTRAVENOUS
  Filled 2020-02-11 (×6): qty 1

## 2020-02-11 NOTE — Progress Notes (Signed)
PROGRESS NOTE    Cindy [name]  ZOX:[id number] DOB: 1960-08-24 DOA: 02/09/2020 PCP: Avon Gully, MD   Brief Narrative:  Per HPI: Cindy [name] a 59 y.o.femalewith medical history significant ofLFTs abnormality history, history of multiple CVAs, hyperlipidemia, hypertension, polysubstance abuse including opioids and cocaine, seizure disorder who is brought via EMS to the emergency department after the patient started having seizures. EMS was initiallycalledbecause of family with patient being lethargic presents 24 hours or so. When EMS arrived the patient was actively seizing and she received 5 mg of IV midazolam. He was febrile in the emergency department without any obvious source. She is still confused, does not follow simple commands and is unable to provide further information.  9/24: Patient was admitted with what appears to be a breakthrough seizure in the setting of positive cocaine use.  She has received Ativan as well as 1 g of IV Keppra and will be started on Keppra 500 mg IV twice daily.  EEG ordered with neurology consultation pending.  She still appears quite somnolent.  She was initially started on some antibiotics empirically, but procalcitonin is low and no other signs of infection or noted, therefore these will be discontinued.  A.m. labs ordered.  9/25: Patient noted to be more awake and responsive this morning, but states "yes" to every question and does not answer questions appropriately.  She is improved from yesterday and has been started on Vimpat with repeat loading dose of Keppra yesterday by neurology.  Continue to monitor through today and consider EEG if she does not have adequate neurological improvement by 9/26.  Assessment & Plan:   Principal Problem:   Seizures (HCC) Active Problems:   HYPERCHOLESTEROLEMIA   Essential hypertension   Glucose intolerance   Acute encephalopathy   Hyponatremia   SIRS (systemic inflammatory response syndrome) (HCC)    Cocaine abuse (HCC)   Seizure (HCC)   Acute encephalopathy secondary to status epilepticus -Patient currently appears postictal -Continue on Keppra IV 500 mg twice daily with repeat loading dose on 9/24 per neurology -Patient started on Vimpat 9/24 -EEG with significant findings reviewed by neurology -Seizure precautions -CT head with no acute findings -Appreciate ongoing neurology evaluation especially by 9/27  Polysubstance abuse -Noted to be positive for benzodiazepines and cocaine -Likely related to breakthrough seizure -Counseling on substance abuse and will need resources in outpatient setting  SIRS criteria -Currently improved with no sign of infection noted -Low procalcitonin -Discontinue antibiotics and monitor -Blood cultures with no growth noted thus far  Hyponatremia-improving -Uncertain etiology -TSH, urine and serum osmolarity's as well as urine sodium ordered for further evaluation -Continue IV fluid with normal saline at this time  Mild hypokalemia/hypomagnesemia -Treat and reevaluate in a.m.  Dyslipidemia -Plan to hold statin for now until she can tolerate p.o.  Essential hypertension-elevated -Resume home amlodipine -We will order IV labetalol as needed for significant elevations   DVT prophylaxis:Lovenox Code Status: Full code Family Communication: Tried calling spouse on 9/25 with no response Disposition Plan:  Status is: Inpatient  Remains inpatient appropriate because:Unsafe d/c plan, IV treatments appropriate due to intensity of illness or inability to take PO and Inpatient level of care appropriate due to severity of illness   Dispo: The patient is from: Home              Anticipated d/c is to: Home              Anticipated d/c date is: 3 days  Patient currently is not medically stable to d/c.  Patient still with status epilepticus and is recovering from a mentation standpoint he needs ongoing neurology  evaluation.   Consultants:   Neurology  Procedures:   See below, EEG pending  Antimicrobials:   None   Subjective: Patient seen and evaluated today and appears more alert and awake.  She is not responding appropriately to questioning, however.  Objective: Vitals:   02/10/20 1700 02/10/20 1812 02/10/20 2217 02/11/20 0619  BP: (!) 169/86 (!) 180/87 (!) 157/78 (!) 145/87  Pulse: 62 70 63 84  Resp: 16 16 18 18   Temp: 97.7 F (36.5 C) 97.8 F (36.6 C) 97.9 F (36.6 C) 98.3 F (36.8 C)  TempSrc:  Oral  Oral  SpO2: 100% 98% 100% 100%  Weight:      Height:        Intake/Output Summary (Last 24 hours) at 02/11/2020 0942 Last data filed at 02/11/2020 02/13/2020 Gross per 24 hour  Intake --  Output 500 ml  Net -500 ml   Filed Weights   02/09/20 1959  Weight: 61.2 kg    Examination:  General exam: Appears calm and comfortable, appears confused Respiratory system: Clear to auscultation. Respiratory effort normal. Cardiovascular system: S1 & S2 heard, RRR.  Gastrointestinal system: Abdomen is nondistended, soft and nontender.  Central nervous system: Alert and awake and responsive Extremities: No edema Skin: No rashes, lesions or ulcers Psychiatry: Flat affect.    Data Reviewed: I have personally reviewed following labs and imaging studies  CBC: Recent Labs  Lab 02/09/20 2057 02/10/20 0823 02/11/20 0655  WBC 14.5* 8.8 8.3  NEUTROABS 13.4* 6.0 6.5  HGB 14.2 12.2 11.4*  HCT 43.9 38.6 35.3*  MCV 85.7 86.2 85.7  PLT 311 246 250   Basic Metabolic Panel: Recent Labs  Lab 02/09/20 2057 02/10/20 0450 02/11/20 0655  NA 127* 128* 131*  K 3.5 4.0 3.2*  CL 96* 100 100  CO2 19* 17* 20*  GLUCOSE 134* 110* 101*  BUN 10 10 10   CREATININE 1.08* 0.94 0.88  CALCIUM 9.2 8.2* 8.4*  MG  --   --  1.5*   GFR: Estimated Creatinine Clearance: 65.7 mL/min (by C-G formula based on SCr of 0.88 mg/dL). Liver Function Tests: Recent Labs  Lab 02/09/20 2057 02/10/20 0450  02/11/20 0655  AST 27 27 22   ALT 16 14 13   ALKPHOS 94 71 56  BILITOT 0.8 1.3* 1.0  PROT 8.7* 6.9 5.8*  ALBUMIN 3.9 3.0* 2.6*   No results for input(s): LIPASE, AMYLASE in the last 168 hours. No results for input(s): AMMONIA in the last 168 hours. Coagulation Profile: Recent Labs  Lab 02/09/20 2057  INR 1.0   Cardiac Enzymes: No results for input(s): CKTOTAL, CKMB, CKMBINDEX, TROPONINI in the last 168 hours. BNP (last 3 results) No results for input(s): PROBNP in the last 8760 hours. HbA1C: No results for input(s): HGBA1C in the last 72 hours. CBG: No results for input(s): GLUCAP in the last 168 hours. Lipid Profile: No results for input(s): CHOL, HDL, LDLCALC, TRIG, CHOLHDL, LDLDIRECT in the last 72 hours. Thyroid Function Tests: Recent Labs    02/10/20 0823  TSH 2.261   Anemia Panel: No results for input(s): VITAMINB12, FOLATE, FERRITIN, TIBC, IRON, RETICCTPCT in the last 72 hours. Sepsis Labs: Recent Labs  Lab 02/09/20 2057 02/10/20 0105 02/10/20 0450  PROCALCITON  --   --  <0.10  LATICACIDVEN 2.2* 2.0*  --     Recent Results (from the  past 240 hour(s))  Respiratory Panel by RT PCR (Flu A&B, Covid) -     Status: None   Collection Time: 02/09/20  8:21 PM   Specimen: Nasopharyngeal  Result Value Ref Range Status   SARS Coronavirus 2 by RT PCR NEGATIVE NEGATIVE Final    Comment: (NOTE) SARS-CoV-2 target nucleic acids are NOT DETECTED.  The SARS-CoV-2 RNA is generally detectable in upper respiratoy specimens during the acute phase of infection. The lowest concentration of SARS-CoV-2 viral copies this assay can detect is 131 copies/mL. A negative result does not preclude SARS-Cov-2 infection and should not be used as the sole basis for treatment or other patient management decisions. A negative result may occur with  improper specimen collection/handling, submission of specimen other than nasopharyngeal swab, presence of viral mutation(s) within the areas  targeted by this assay, and inadequate number of viral copies (<131 copies/mL). A negative result must be combined with clinical observations, patient history, and epidemiological information. The expected result is Negative.  Fact Sheet for Patients:  https://www.moore.com/  Fact Sheet for Healthcare Providers:  https://www.young.biz/  This test is no t yet approved or cleared by the [place] FDA and  has been authorized for detection and/or diagnosis of SARS-CoV-2 by FDA under an Emergency Use Authorization (EUA). This EUA will remain  in effect (meaning this test can be used) for the duration of the COVID-19 declaration under Section 564(b)(1) of the Act, 21 U.S.C. section 360bbb-3(b)(1), unless the authorization is terminated or revoked sooner.     Influenza A by PCR NEGATIVE NEGATIVE Final   Influenza B by PCR NEGATIVE NEGATIVE Final    Comment: (NOTE) The Xpert Xpress SARS-CoV-2/FLU/RSV assay is intended as an aid in  the diagnosis of influenza from Nasopharyngeal swab specimens and  should not be used as a sole basis for treatment. Nasal washings and  aspirates are unacceptable for Xpert Xpress SARS-CoV-2/FLU/RSV  testing.  Fact Sheet for Patients: https://www.moore.com/  Fact Sheet for Healthcare Providers: https://www.young.biz/  This test is not yet approved or cleared by the [place] FDA and  has been authorized for detection and/or diagnosis of SARS-CoV-2 by  FDA under an Emergency Use Authorization (EUA). This EUA will remain  in effect (meaning this test can be used) for the duration of the  Covid-19 declaration under Section 564(b)(1) of the Act, 21  U.S.C. section 360bbb-3(b)(1), unless the authorization is  terminated or revoked. Performed at [facility], [address], [place], [place] [address]   Blood Culture (routine x 2)     Status: None (Preliminary result)    Collection Time: 02/09/20  8:52 PM   Specimen: BLOOD  Result Value Ref Range Status   Specimen Description BLOOD BLOOD RIGHT ARM  Final   Special Requests   Final    BOTTLES DRAWN AEROBIC AND ANAEROBIC Blood Culture adequate volume   Culture   Final    NO GROWTH 2 DAYS Performed at [facility], [address] [place], [place] [address]    Report Status PENDING  Incomplete  Blood Culture (routine x 2)     Status: None (Preliminary result)   Collection Time: 02/09/20  8:57 PM   Specimen: BLOOD RIGHT HAND  Result Value Ref Range Status   Specimen Description BLOOD RIGHT HAND  Final   Special Requests   Final    BOTTLES DRAWN AEROBIC AND ANAEROBIC Blood Culture adequate volume   Culture   Final    NO GROWTH 2 DAYS Performed at [facility], [address] [place], [place] [address]    Report Status PENDING  Incomplete  Urine culture     Status: None   Collection Time: 02/09/20  9:11 PM   Specimen: In/Out Cath Urine  Result Value Ref Range Status   Specimen Description   Final    IN/OUT CATH URINE Performed at [facility], [address] [place], [place] [address]    Special Requests   Final    NONE Performed at [facility], [address] [place], [place] [address]    Culture   Final    NO GROWTH Performed at [facility], [address] [place], [place] [address]    Report Status 02/11/2020 FINAL  Final         Radiology Studies: CT Head Wo Contrast  Result Date: 02/09/2020 CLINICAL DATA:  Cerebral hemorrhage suspected Lethargy with seizure. EXAM: CT HEAD WITHOUT CONTRAST TECHNIQUE: Contiguous axial images were obtained from the base of the skull through the vertex without intravenous contrast. COMPARISON:  Head CT 08/05/2019, brain MRI 08/10/2019 FINDINGS: Brain: No acute hemorrhage. Generalized atrophy, mild but age advanced. Advanced periventricular white matter hypodensity consistent with chronic small vessel ischemia, particularly advanced for age. Lacunar  infarct in the right basal ganglia. Remote but new from March 2021. Additional tiny bilateral basal gangliar lacunar infarcts are unchanged. No evidence of acute ischemia. No subdural or extra-axial collection. Vascular: Atherosclerosis of skullbase vasculature without hyperdense vessel or abnormal calcification. Skull: No fracture or focal lesion. Sinuses/Orbits: Fluid level with frothy debris in the left side of sphenoid sinus. Remaining paranasal sinuses are clear. No mastoid effusion. Orbits are unremarkable. Other: None. IMPRESSION: 1. No acute intracranial abnormality. 2. Generalized atrophy and chronic small vessel ischemia similar to prior exam but advanced for age. Lacunar infarct in the right basal ganglia appears remote, but new from March 2021. 3. Fluid level with frothy debris in the left side of the sphenoid sinus, can be seen with acute sinusitis. Electronically Signed   By: Narda RutherfordMelanie  Sanford M.D.   On: 02/09/2020 20:36   DG Chest Port 1 View  Result Date: 02/09/2020 CLINICAL DATA:  Weakness. EXAM: PORTABLE CHEST 1 VIEW COMPARISON:  08/07/2019 FINDINGS: Lung volumes are low. Upper normal heart size likely accentuated by low lung volumes. Unchanged mediastinal contours. No acute or focal airspace disease. No pleural effusion or pneumothorax. No pulmonary edema. Chronic right AC joint separation. Bones are under mineralized. No acute osseous abnormalities are seen. IMPRESSION: Low lung volumes without acute abnormality. Electronically Signed   By: Narda RutherfordMelanie  Sanford M.D.   On: 02/09/2020 20:37   EEG adult  Result Date: 02/10/2020 Charlsie QuestYadav, Priyanka O, MD     02/10/2020  5:40 PM Patient Name: Treasa SchoolLisa S Grein MRN: [id number] Epilepsy Attending: Charlsie QuestPriyanka O Yadav Referring Physician/Provider: Dr Maurilio LovelyPratik Andreal Vultaggio Date: 02/10/2020 Duration: 23.29 mins Patient history: 59yo F with breakthrough seizure. EEG to evaluate for seizure. Level of alertness:  lethargic AEDs during EEG study: LEV Technical aspects: This EEG  study was done with scalp electrodes positioned according to the 10-20 International system of electrode placement. Electrical activity was acquired at a sampling rate of 500Hz  and reviewed with a high frequency filter of 70Hz  and a low frequency filter of 1Hz . EEG data were recorded continuously and digitally stored. Description: No posterior dominant rhythm was seen. EEG showed continuous 3 to 6 Hz theta-delta slowing in left hemisphere as well as disorganised 6-9z theta-alpha activity in right hemisphere. Periodic epileptiform discharges were seen in  left hemisphere, maximal left posterior quadrant at 1Hz . Hyperventilation and photic stimulation were not performed.   ABNORMALITY -Continued slow, lateralized left hemisphere -Periodic epileptiform discharges, left hemisphere, maximal left posterior quadrant IMPRESSION: This study showed evidence of epileptogenicity and cortical dysfunction arising from left hemisphere, maximal left posterior quadrant suggestive of underlying structural abnormality. This pattern is on the ictal-interictal continuum with higher potential for seizure recurrence. Additionally, there is evidence of severe diffuse encephalopathy, non specific etiology but could be secondary to sedation, seizure, post-ictal state. No seizures were seen throughout the recording. Priyanka        Scheduled Meds: . Chlorhexidine Gluconate Cloth  6 each Topical Daily  . enoxaparin (LOVENOX) injection  40 mg Subcutaneous Q24H   Continuous Infusions: . sodium chloride 75 mL/hr at 02/10/20 1211  . lacosamide (VIMPAT) IV 200 mg (02/10/20 2216)  . levETIRAcetam 1,500 mg (02/11/20 02/13/20)     LOS: 1 day    Time spent: 35 minutes    Maverick Patman D [id number], DO [facility]  If 7PM-7AM, please contact night-coverage [email or web address] 02/11/2020, 9:42 AM

## 2020-02-12 LAB — CBC WITH DIFFERENTIAL/PLATELET
Abs Immature Granulocytes: 0.02 10*3/uL (ref 0.00–0.07)
Basophils Absolute: 0 10*3/uL (ref 0.0–0.1)
Basophils Relative: 1 %
Eosinophils Absolute: 0 10*3/uL (ref 0.0–0.5)
Eosinophils Relative: 1 %
HCT: 37.2 % (ref 36.0–46.0)
Hemoglobin: 12 g/dL (ref 12.0–15.0)
Immature Granulocytes: 0 %
Lymphocytes Relative: 7 %
Lymphs Abs: 0.5 10*3/uL — ABNORMAL LOW (ref 0.7–4.0)
MCH: 27.2 pg (ref 26.0–34.0)
MCHC: 32.3 g/dL (ref 30.0–36.0)
MCV: 84.4 fL (ref 80.0–100.0)
Monocytes Absolute: 0.8 10*3/uL (ref 0.1–1.0)
Monocytes Relative: 11 %
Neutro Abs: 5.7 10*3/uL (ref 1.7–7.7)
Neutrophils Relative %: 80 %
Platelets: 267 10*3/uL (ref 150–400)
RBC: 4.41 MIL/uL (ref 3.87–5.11)
RDW: 15 % (ref 11.5–15.5)
WBC: 7.1 10*3/uL (ref 4.0–10.5)
nRBC: 0 % (ref 0.0–0.2)

## 2020-02-12 LAB — BASIC METABOLIC PANEL
Anion gap: 8 (ref 5–15)
BUN: 6 mg/dL (ref 6–20)
CO2: 21 mmol/L — ABNORMAL LOW (ref 22–32)
Calcium: 8.5 mg/dL — ABNORMAL LOW (ref 8.9–10.3)
Chloride: 100 mmol/L (ref 98–111)
Creatinine, Ser: 0.65 mg/dL (ref 0.44–1.00)
GFR calc Af Amer: >60 mL/min (ref >60)
GFR calc non Af Amer: >60 mL/min (ref >60)
Glucose, Bld: 112 mg/dL — ABNORMAL HIGH (ref 70–99)
Potassium: 3 mmol/L — ABNORMAL LOW (ref 3.5–5.1)
Sodium: 129 mmol/L — ABNORMAL LOW (ref 135–145)

## 2020-02-12 LAB — MAGNESIUM: Magnesium: 1.7 mg/dL (ref 1.7–2.4)

## 2020-02-12 MED ORDER — ADULT MULTIVITAMIN W/MINERALS CH
1.0000 | ORAL_TABLET | Freq: Every day | ORAL | Status: DC
  Administered 2020-02-12 – 2020-02-17 (×6): 1 via ORAL
  Filled 2020-02-12 (×6): qty 1

## 2020-02-12 MED ORDER — THIAMINE HCL 100 MG PO TABS
100.0000 mg | ORAL_TABLET | Freq: Every day | ORAL | Status: DC
  Administered 2020-02-12 – 2020-02-17 (×6): 100 mg via ORAL
  Filled 2020-02-12 (×5): qty 1

## 2020-02-12 MED ORDER — LORAZEPAM 2 MG/ML IJ SOLN
1.0000 mg | INTRAMUSCULAR | Status: AC | PRN
  Administered 2020-02-12 (×2): 1 mg via INTRAVENOUS
  Administered 2020-02-13 – 2020-02-14 (×2): 2 mg via INTRAVENOUS
  Administered 2020-02-14: 1 mg via INTRAVENOUS
  Administered 2020-02-14: 4 mg via INTRAVENOUS
  Filled 2020-02-12 (×4): qty 1
  Filled 2020-02-12 (×3): qty 2

## 2020-02-12 MED ORDER — THIAMINE HCL 100 MG/ML IJ SOLN: 100.0000 mg | Freq: Every day | INTRAMUSCULAR | Status: DC

## 2020-02-12 MED ORDER — POTASSIUM CHLORIDE 10 MEQ/100ML IV SOLN
10.0000 meq | INTRAVENOUS | Status: AC
  Administered 2020-02-12 (×4): 10 meq via INTRAVENOUS
  Filled 2020-02-12 (×3): qty 100

## 2020-02-12 MED ORDER — LORAZEPAM 1 MG PO TABS: 1.0000 mg | ORAL_TABLET | ORAL | Status: AC | PRN

## 2020-02-12 MED ORDER — POTASSIUM CHLORIDE CRYS ER 20 MEQ PO TBCR
40.0000 meq | EXTENDED_RELEASE_TABLET | Freq: Once | ORAL | Status: AC
  Administered 2020-02-12: 40 meq via ORAL
  Filled 2020-02-12: qty 2

## 2020-02-12 MED ORDER — FOLIC ACID 1 MG PO TABS
1.0000 mg | ORAL_TABLET | Freq: Every day | ORAL | Status: DC
  Administered 2020-02-12 – 2020-02-17 (×6): 1 mg via ORAL
  Filled 2020-02-12 (×6): qty 1

## 2020-02-12 NOTE — TOC Initial Note (Signed)
Transition of Care [facility]) - Initial/Assessment Note    Patient Details  Name: Cindy [name] MRN: [id number] Date of Birth: 01/05/1961  Transition of Care [place]) CM/SW Contact:    Barry Brunner, LCSW Phone Number: 02/12/2020, 3:49 PM  Clinical Narrative:                 Patient is 59 year old female admitted for Seizures. CSW attempted SA consult and assessment. Patient continued to not be alert after morning ativan. TOC to follow.         Patient Goals and CMS Choice        Expected Discharge Plan and Services                                                Prior Living Arrangements/Services                       Activities of Daily Living Home Assistive Devices/Equipment: Eyeglasses ADL Screening (condition at time of admission) Patient's cognitive ability adequate to safely complete daily activities?: No Is the patient deaf or have difficulty hearing?: No Does the patient have difficulty seeing, even when wearing glasses/contacts?: No Does the patient have difficulty concentrating, remembering, or making decisions?: Yes Patient able to express need for assistance with ADLs?: No Does the patient have difficulty dressing or bathing?: Yes Independently performs ADLs?: No Dressing (OT): Independent Grooming: Appropriate for developmental age, Needs assistance Is this a change from baseline?: Change from baseline, expected to last <3 days Feeding: Independent Bathing: Needs assistance Is this a change from baseline?: Change from baseline, expected to last <3 days Toileting: Needs assistance Is this a change from baseline?: Change from baseline, expected to last <3 days In/Out Bed: Needs assistance Is this a change from baseline?: Change from baseline, expected to last <3 days Walks in Home: Needs assistance Is this a change from baseline?: Change from baseline, expected to last <3 days Does the patient have difficulty walking or climbing stairs?:  Yes Weakness of Legs: Both Weakness of Arms/Hands: Both  Permission Sought/Granted                  Emotional Assessment              Admission diagnosis:  Seizure (HCC) [R56.9] Seizures (HCC) [R56.9] Patient Active Problem List   Diagnosis Date Noted  . Seizure (HCC) 02/10/2020  . Seizures (HCC) 02/09/2020  . Hyponatremia 02/09/2020  . SIRS (systemic inflammatory response syndrome) (HCC) 02/09/2020  . Cocaine abuse (HCC) 02/09/2020  . 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 Cerebral embolism with cerebral infarction 08/13/2019  . Status epilepticus (HCC) 08/05/2019  . Acute encephalopathy 08/05/2019  . TRANSAMINASES, SERUM, ELEVATED 10/23/2009  . HYPERCHOLESTEROLEMIA 07/03/2008  . ALCOHOL USE 07/03/2008  . Essential hypertension 07/03/2008  . STROKE 07/03/2008  . DYSPHAGIA UNSPECIFIED 07/03/2008  . Glucose intolerance 07/03/2008  . Pain in joint, shoulder region 06/22/2008  . SPONDYLOSIS 06/22/2008  . CERVICALGIA 06/22/2008  . SPINAL STENOSIS 06/22/2008  . CERVICAL SPASM 06/22/2008   PCP:  Avon Gully, MD Pharmacy:   [phone] - [place], [place] - [address] [place] [address] Phone: [phone] Fax: [phone]  [id number] - [place], [place] - [address] AT [facility] [address]  [place] [address] Phone: [phone] Fax: [phone]     Social Determinants of Health (SDOH) Interventions    Readmission Risk Interventions No flowsheet data found.

## 2020-02-12 NOTE — Progress Notes (Signed)
Patient alert and more talkative.  Patient able to have a simple conversation.  Patient stated the last time she drank was on Tuesday.  Patient is alert, but very anxious and agitated. Patient able to state her name and says she knows it is Sunday.  Will continue to monitor patient.

## 2020-02-12 NOTE — Progress Notes (Addendum)
PROGRESS NOTE    Cindy [name]  VFI:[id number] DOB: 05-03-1961 DOA: 02/09/2020 PCP: Avon Gully, MD   Brief Narrative:  Per HPI: Cindy [name] a 59 y.o.femalewith medical history significant ofLFTs abnormality history, history of multiple CVAs, hyperlipidemia, hypertension, polysubstance abuse including opioids and cocaine, seizure disorder who is brought via EMS to the emergency department after the patient started having seizures. EMS was initiallycalledbecause of family with patient being lethargic presents 24 hours or so. When EMS arrived the patient was actively seizing and she received 5 mg of IV midazolam. He was febrile in the emergency department without any obvious source. She is still confused, does not follow simple commands and is unable to provide further information.  9/24:Patient was admitted with what appears to be a breakthrough seizure in the setting of positive cocaine use. She has received Ativan as well as 1 g of IV Keppra and will be started on Keppra 500 mg IV twice daily. EEG ordered with neurology consultation pending. She still appears quite somnolent. She was initially started on some antibiotics empirically, but procalcitonin is low and no other signs of infection or noted,therefore these will be discontinued. A.m. labs ordered.  9/25: Patient noted to be more awake and responsive this morning, but states "yes" to every question and does not answer questions appropriately.  She is improved from yesterday and has been started on Vimpat with repeat loading dose of Keppra yesterday by neurology.  Continue to monitor through today and consider EEG if she does not have adequate neurological improvement by 9/26.  9/26: Per nursing staff, patient has been more alert and talkative overall, but she did receive Ativan earlier this morning and is currently quite somnolent.  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  Assessment & Plan:   Principal Problem:   Seizures (HCC) Active Problems:   HYPERCHOLESTEROLEMIA   Essential hypertension   Glucose intolerance   Acute encephalopathy   Hyponatremia   SIRS (systemic inflammatory response syndrome) (HCC)   Cocaine abuse (HCC)   Seizure (HCC)   Acute encephalopathy secondary to status epilepticus -Patient currently appears postictal -Continue on Keppra IV 500 mg twice daily with repeat loading dose on 9/24 per neurology -Patient started on Vimpat 9/24 -EEG with significant findings reviewed by neurology -Seizure precautions -CT head with no acute findings -Appreciate ongoing neurology evaluation especially by 9/27 -Plan to consider repeat EEG today if she does not become more awake after Ativan wears off  Polysubstance abuse -Noted to be positive for benzodiazepines and cocaine -Likely related to breakthrough seizure versus alcohol withdrawal -Counseling on substance abuse and will need resources in outpatient setting  SIRS criteria -Currently improved with no sign of infection noted -Low procalcitonin -Discontinue antibiotics and monitor -Blood cultures with no growth noted thus far  Hyponatremia-improving -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  Dyslipidemia -Plan to hold statin for now until she can tolerate p.o.  Essential hypertension-elevated -Resume home amlodipine -We will order IV labetalol as needed for significant elevations   DVT prophylaxis:Lovenox Code Status:Full code Family Communication:Husband at bedside 9/26 Disposition Plan: Status is: Inpatient  Remains inpatient appropriate because:Unsafe d/c plan, IV treatments appropriate due to intensity of illness or inability to take PO and Inpatient level of care appropriate due to severity of illness   Dispo:  The patient is from: Home  Anticipated d/c is to: Home vs SNF  Anticipated d/c date is: 2-3 days  Patient currently is not medically stable to d/c.  Patient still with status epilepticus and is recovering from a mentation standpoint he needs ongoing neurology evaluation.   Consultants:  Neurology  Procedures:  See below, EEG pending  Antimicrobials:   None   Subjective: Patient seen and evaluated today and is unresponsive and quite somnolent.  She does respond to painful stimuli and did receive Ativan a few hours ago.  Objective: Vitals:   02/11/20 0619 02/11/20 1425 02/11/20 2319 02/12/20 0505  BP: (!) 145/87 (!) 152/83 (!) 180/75 (!) 152/94  Pulse: 84 (!) 101 95 100  Resp: 18 16 18 16   Temp: 98.3 F (36.8 C) 98.9 F (37.2 C) 98.6 F (37 C) 97.8 F (36.6 C)  TempSrc: Oral Oral    SpO2: 100% 98% 98% 94%  Weight:      Height:        Intake/Output Summary (Last 24 hours) at 02/12/2020 1005 Last data filed at 02/12/2020 0300 Gross per 24 hour  Intake 575 ml  Output 1350 ml  Net -775 ml   Filed Weights   02/09/20 1959  Weight: 61.2 kg    Examination:  General exam: Appears somnolent and unresponsive Respiratory system: Clear to auscultation. Respiratory effort normal. Cardiovascular system: S1 & S2 heard, RRR.  Gastrointestinal system: Abdomen is nondistended, soft and nontender. Central nervous system: Unresponsive Extremities: No edema Skin: No rashes, lesions or ulcers Psychiatry: Cannot be assessed    Data Reviewed: I have personally reviewed following labs and imaging studies  CBC: Recent Labs  Lab 02/09/20 2057 02/10/20 0823 02/11/20 0655 02/12/20 0744  WBC 14.5* 8.8 8.3 7.1  NEUTROABS 13.4* 6.0 6.5 5.7  HGB 14.2 12.2 11.4* 12.0  HCT 43.9 38.6 35.3* 37.2  MCV 85.7 86.2 85.7 84.4  PLT 311 246 250 267   Basic Metabolic Panel: Recent Labs  Lab 02/09/20 2057 02/10/20 0450 02/11/20 0655  02/12/20 0744  NA 127* 128* 131* 129*  K 3.5 4.0 3.2* 3.0*  CL 96* 100 100 100  CO2 19* 17* 20* 21*  GLUCOSE 134* 110* 101* 112*  BUN 10 10 10 6   CREATININE 1.08* 0.94 0.88 0.65  CALCIUM 9.2 8.2* 8.4* 8.5*  MG  --   --  1.5* 1.7   GFR: Estimated Creatinine Clearance: 72.3 mL/min (by C-G formula based on SCr of 0.65 mg/dL). Liver Function Tests: Recent Labs  Lab 02/09/20 2057 02/10/20 0450 02/11/20 0655  AST 27 27 22   ALT 16 14 13   ALKPHOS 94 71 56  BILITOT 0.8 1.3* 1.0  PROT 8.7* 6.9 5.8*  ALBUMIN 3.9 3.0* 2.6*   No results for input(s): LIPASE, AMYLASE in the last 168 hours. No results for input(s): AMMONIA in the last 168 hours. Coagulation Profile: Recent Labs  Lab 02/09/20 2057  INR 1.0   Cardiac Enzymes: No results for input(s): CKTOTAL, CKMB, CKMBINDEX, TROPONINI in the last 168 hours. BNP (last 3 results) No results for input(s): PROBNP in the last 8760 hours. HbA1C: No results for input(s): HGBA1C in the last 72 hours. CBG: No results for input(s): GLUCAP in the last 168 hours. Lipid Profile: No results for input(s): CHOL, HDL, LDLCALC, TRIG, CHOLHDL, LDLDIRECT in the last 72 hours. Thyroid Function Tests: Recent Labs    02/10/20 0823  TSH 2.261   Anemia Panel: No results for input(s): VITAMINB12, FOLATE, FERRITIN, TIBC, IRON, RETICCTPCT in the last 72 hours. Sepsis Labs:  Recent Labs  Lab 02/09/20 2057 02/10/20 0105 02/10/20 0450  PROCALCITON  --   --  <0.10  LATICACIDVEN 2.2* 2.0*  --     Recent Results (from the past 240 hour(s))  Respiratory Panel by RT PCR (Flu A&B, Covid) -     Status: None   Collection Time: 02/09/20  8:21 PM   Specimen: Nasopharyngeal  Result Value Ref Range Status   SARS Coronavirus 2 by RT PCR NEGATIVE NEGATIVE Final    Comment: (NOTE) SARS-CoV-2 target nucleic acids are NOT DETECTED.  The SARS-CoV-2 RNA is generally detectable in upper respiratoy specimens during the acute phase of infection. The  lowest concentration of SARS-CoV-2 viral copies this assay can detect is 131 copies/mL. A negative result does not preclude SARS-Cov-2 infection and should not be used as the sole basis for treatment or other patient management decisions. A negative result may occur with  improper specimen collection/handling, submission of specimen other than nasopharyngeal swab, presence of viral mutation(s) within the areas targeted by this assay, and inadequate number of viral copies (<131 copies/mL). A negative result must be combined with clinical observations, patient history, and epidemiological information. The expected result is Negative.  Fact Sheet for Patients:  https://www.moore.com/  Fact Sheet for Healthcare Providers:  https://www.young.biz/  This test is no t yet approved or cleared by the [place] FDA and  has been authorized for detection and/or diagnosis of SARS-CoV-2 by FDA under an Emergency Use Authorization (EUA). This EUA will remain  in effect (meaning this test can be used) for the duration of the COVID-19 declaration under Section 564(b)(1) of the Act, 21 U.S.C. section 360bbb-3(b)(1), unless the authorization is terminated or revoked sooner.     Influenza A by PCR NEGATIVE NEGATIVE Final   Influenza B by PCR NEGATIVE NEGATIVE Final    Comment: (NOTE) The Xpert Xpress SARS-CoV-2/FLU/RSV assay is intended as an aid in  the diagnosis of influenza from Nasopharyngeal swab specimens and  should not be used as a sole basis for treatment. Nasal washings and  aspirates are unacceptable for Xpert Xpress SARS-CoV-2/FLU/RSV  testing.  Fact Sheet for Patients: https://www.moore.com/  Fact Sheet for Healthcare Providers: https://www.young.biz/  This test is not yet approved or cleared by the [place] FDA and  has been authorized for detection and/or diagnosis of SARS-CoV-2 by  FDA under  an Emergency Use Authorization (EUA). This EUA will remain  in effect (meaning this test can be used) for the duration of the  Covid-19 declaration under Section 564(b)(1) of the Act, 21  U.S.C. section 360bbb-3(b)(1), unless the authorization is  terminated or revoked. Performed at [facility], [address], [place], [place] [address]   Blood Culture (routine x 2)     Status: None (Preliminary result)   Collection Time: 02/09/20  8:52 PM   Specimen: BLOOD  Result Value Ref Range Status   Specimen Description BLOOD BLOOD RIGHT ARM  Final   Special Requests   Final    BOTTLES DRAWN AEROBIC AND ANAEROBIC Blood Culture adequate volume   Culture   Final    NO GROWTH 2 DAYS Performed at [facility], [address] [place], [place] [address]    Report Status PENDING  Incomplete  Blood Culture (routine x 2)     Status: None (Preliminary result)   Collection Time: 02/09/20  8:57 PM   Specimen: BLOOD RIGHT HAND  Result Value Ref Range Status   Specimen Description BLOOD RIGHT HAND  Final   Special  Requests   Final    BOTTLES DRAWN AEROBIC AND ANAEROBIC Blood Culture adequate volume   Culture   Final    NO GROWTH 2 DAYS Performed at [facility], [address] [place], [place] [address]    Report Status PENDING  Incomplete  Urine culture     Status: None   Collection Time: 02/09/20  9:11 PM   Specimen: In/Out Cath Urine  Result Value Ref Range Status   Specimen Description   Final    IN/OUT CATH URINE Performed at [facility], [address] [place], [place] [address]    Special Requests   Final    NONE Performed at [facility], [address], [place], [place] [address]    Culture   Final    NO GROWTH Performed at [facility], [address] [place], [place] [address]    Report Status 02/11/2020 FINAL  Final         Radiology Studies: EEG adult  Result Date: 03-10-2020 Charlsie Quest, MD     10-Mar-2020  5:40 PM Patient Name: MASIYAH JORSTAD MRN: [id number]  Epilepsy Attending: Charlsie Quest Referring Physician/Provider: Dr Maurilio Lovely Date: March 10, 2020 Duration: 23.29 mins Patient history: 59yo F with breakthrough seizure. EEG to evaluate for seizure. Level of alertness:  lethargic AEDs during EEG study: LEV Technical aspects: This EEG study was done with scalp electrodes positioned according to the 10-20 International system of electrode placement. Electrical activity was acquired at a sampling rate of 500Hz  and reviewed with a high frequency filter of 70Hz  and a low frequency filter of 1Hz . EEG data were recorded continuously and digitally stored. Description: No posterior dominant rhythm was seen. EEG showed continuous 3 to 6 Hz theta-delta slowing in left hemisphere as well as disorganised 6-9z theta-alpha activity in right hemisphere. Periodic epileptiform discharges were seen in left hemisphere, maximal left posterior quadrant at 1Hz . Hyperventilation and photic stimulation were not performed.   ABNORMALITY -Continued slow, lateralized left hemisphere -Periodic epileptiform discharges, left hemisphere, maximal left posterior quadrant IMPRESSION: This study showed evidence of epileptogenicity and cortical dysfunction arising from left hemisphere, maximal left posterior quadrant suggestive of underlying structural abnormality. This pattern is on the ictal-interictal continuum with higher potential for seizure recurrence. Additionally, there is evidence of severe diffuse encephalopathy, non specific etiology but could be secondary to sedation, seizure, post-ictal state. No seizures were seen throughout the recording. Priyanka        Scheduled Meds: . amLODipine  10 mg Oral Daily  . Chlorhexidine Gluconate Cloth  6 each Topical Daily  . enoxaparin (LOVENOX) injection  40 mg Subcutaneous Q24H  . potassium chloride  40 mEq Oral Once   Continuous Infusions: . lacosamide (VIMPAT) IV 200 mg (02/11/20 2258)  . levETIRAcetam 1,500 mg (02/12/20 0618)   . potassium chloride       LOS: 2 days    Time spent: 35 minutes    Alberta Cairns , DO [facility]  If 7PM-7AM, please contact night-coverage [email or web address] 02/12/2020, 10:05 AM

## 2020-02-13 ENCOUNTER — Inpatient Hospital Stay (HOSPITAL_COMMUNITY)
Admit: 2020-02-13 | Discharge: 2020-02-13 | Disposition: A | Discharge status: Home / Self Care | Payer: Medicaid Other | Attending: Internal Medicine | Admitting: Internal Medicine

## 2020-02-13 LAB — CBC WITH DIFFERENTIAL/PLATELET
Abs Immature Granulocytes: 0.04 10*3/uL (ref 0.00–0.07)
Basophils Absolute: 0.1 10*3/uL (ref 0.0–0.1)
Basophils Relative: 1 %
Eosinophils Absolute: 0.1 10*3/uL (ref 0.0–0.5)
Eosinophils Relative: 1 %
HCT: 39.1 % (ref 36.0–46.0)
Hemoglobin: 12.5 g/dL (ref 12.0–15.0)
Immature Granulocytes: 1 %
Lymphocytes Relative: 11 %
Lymphs Abs: 0.8 10*3/uL (ref 0.7–4.0)
MCH: 27.6 pg (ref 26.0–34.0)
MCHC: 32 g/dL (ref 30.0–36.0)
MCV: 86.3 fL (ref 80.0–100.0)
Monocytes Absolute: 1 10*3/uL (ref 0.1–1.0)
Monocytes Relative: 13 %
Neutro Abs: 5.8 10*3/uL (ref 1.7–7.7)
Neutrophils Relative %: 73 %
Platelets: 262 10*3/uL (ref 150–400)
RBC: 4.53 MIL/uL (ref 3.87–5.11)
RDW: 15.7 % — ABNORMAL HIGH (ref 11.5–15.5)
WBC: 7.8 10*3/uL (ref 4.0–10.5)
nRBC: 0 % (ref 0.0–0.2)

## 2020-02-13 LAB — BASIC METABOLIC PANEL
Anion gap: 11 (ref 5–15)
BUN: 8 mg/dL (ref 6–20)
CO2: 20 mmol/L — ABNORMAL LOW (ref 22–32)
Calcium: 8.8 mg/dL — ABNORMAL LOW (ref 8.9–10.3)
Chloride: 100 mmol/L (ref 98–111)
Creatinine, Ser: 0.87 mg/dL (ref 0.44–1.00)
GFR calc Af Amer: >60 mL/min (ref >60)
GFR calc non Af Amer: >60 mL/min (ref >60)
Glucose, Bld: 79 mg/dL (ref 70–99)
Potassium: 3.8 mmol/L (ref 3.5–5.1)
Sodium: 131 mmol/L — ABNORMAL LOW (ref 135–145)

## 2020-02-13 LAB — MAGNESIUM: Magnesium: 1.6 mg/dL — ABNORMAL LOW (ref 1.7–2.4)

## 2020-02-13 MED ORDER — ENSURE ENLIVE PO LIQD
237.0000 mL | Freq: Two times a day (BID) | ORAL | Status: DC
  Administered 2020-02-13 – 2020-02-16 (×6): 237 mL via ORAL

## 2020-02-13 MED ORDER — MAGNESIUM SULFATE 2 GM/50ML IV SOLN
2.0000 g | Freq: Once | INTRAVENOUS | Status: AC
  Administered 2020-02-13: 2 g via INTRAVENOUS
  Filled 2020-02-13: qty 50

## 2020-02-13 NOTE — Progress Notes (Addendum)
Initial Nutrition Assessment  DOCUMENTATION CODES:      INTERVENTION:  Ensure Enlive po BID, each supplement provides 350 kcal and 20 grams of protein   Assist with feeding all meals   NUTRITION DIAGNOSIS:   Inadequate oral intake related to acute illness (seizures) as evidenced by meal completion < 50% (somulent).   GOAL:   Patient will meet greater than or equal to 90% of their needs  MONITOR:   PO intake, Supplement acceptance, I & O's, Skin, Weight trends, Labs  REASON FOR ASSESSMENT:  Malnutrition Screening Tool    ASSESSMENT: Patient is a 59 yo female with hx of polysubstance drug abuse, hypertension, Hyperlipidemia and CVA. She presented with seizures, acute encephalopathy, hyponatremia.  Talked with her nurse and attempted to speak with patient. Nursing reports pt husband fed her some breakfast this morning. PO: 25-65% per chart. She has not eaten any lunch at time of RD visit.      Weight history review suggests gain since April from 56.9 kg to current 61.2 kg.   Intake/Output Summary (Last 24 hours) at 02/13/2020 1508 Last data filed at 02/13/2020 1416 Gross per 24 hour  Intake 270 ml  Output 2500 ml  Net -2230 ml    Medications reviewed and include: folvite, MVI, Thiamine  Drips: Vimpat Deppra  Labs: BMP Latest Ref Rng & Units 02/13/2020 02/12/2020 02/11/2020  Glucose 70 - 99 mg/dL 79 573(U) 202(R)  BUN 6 - 20 mg/dL 8 6 10   Creatinine 0.44 - 1.00 mg/dL 4.27 0.62  BUN/Creat Ratio 9 - 23 - - -  Sodium 135 - 145 mmol/L 131(L) 129(L) 131(L)  Potassium 3.5 - 5.1 mmol/L 3.8 3.0(L) 3.2(L)  Chloride 98 - 111 mmol/L 100 100 100  CO2 22 - 32 mmol/L 20(L) 21(L) 20(L)  Calcium 8.9 - 10.3 mg/dL 3.76) 2.8(B) 1.5(V)     NUTRITION - FOCUSED PHYSICAL EXAM: Limited exam-completed. Findings are no fat depletion, mild muscle temporal and mild orbital, buccal fat loss, deltoid depletion, and no LE edema.     Diet Order:   Diet Order            Diet Heart  Room service appropriate? Yes; Fluid consistency: Thin; Fluid restriction: 1200 mL Fluid  Diet effective 0500                 EDUCATION NEEDS:  Not appropriate for education at this time Skin:  Skin Assessment: Reviewed RN Assessment  Last BM:  9/24  Height:   Ht Readings from Last 1 Encounters:  02/09/20 5\' 7"  (1.702 m)    Weight:   Wt Readings from Last 1 Encounters:  02/09/20 61.2 kg    Ideal Body Weight:   59 yo  BMI:  Body mass index is 21.14 kg/m.  Estimated Nutritional Needs:   Kcal:  02/11/20  Protein:  79-85 gr  Fluid:  1.7-1.8 liters daily  77 MS,RD,CSG,LDN Pager: [phone]

## 2020-02-13 NOTE — Progress Notes (Signed)
EEG Completed; Results Pending  

## 2020-02-13 NOTE — Progress Notes (Signed)
At approximately 0455, patient had some type of unusual activity on the telemetry monitor.  Stat EKG done showed sinus tach, patient did not appear to be post seizure, however, patient does seem to be having some type of focal seizures at times.  MD notified of unusual activity and instructions were received to continue to monitor for seizure activities.

## 2020-02-13 NOTE — Progress Notes (Signed)
PROGRESS NOTE    Cindy [name]  XAJ:[id number] DOB: 11/15/1960 DOA: 02/09/2020 PCP: Avon Gully, MD   Brief Narrative:  Per HPI: Cindy [name] a 59 y.o.femalewith medical history significant ofLFTs abnormality history, history of multiple CVAs, hyperlipidemia, hypertension, polysubstance abuse including opioids and cocaine, seizure disorder who is brought via EMS to the emergency department after the patient started having seizures. EMS was initiallycalledbecause of family with patient being lethargic presents 24 hours or so. When EMS arrived the patient was actively seizing and she received 5 mg of IV midazolam. He was febrile in the emergency department without any obvious source. She is still confused, does not follow simple commands and is unable to provide further information.  9/24:Patient was admitted with what appears to be a breakthrough seizure in the setting of positive cocaine use. She has received Ativan as well as 1 g of IV Keppra and will be started on Keppra 500 mg IV twice daily. EEG ordered with neurology consultation pending. She still appears quite somnolent. She was initially started on some antibiotics empirically, but procalcitonin is low and no other signs of infection or noted,therefore these will be discontinued. A.m. labs ordered.  9/25:Patient noted to be more awake and responsive this morning, but states "yes" to every question and does not answer questions appropriately. She is improved from yesterday and has been started on Vimpat with repeat loading dose of Keppra yesterday by neurology. Continue to monitor through today and consider EEG if she does not have adequate neurological improvement by 9/26.  9/26: Per nursing staff, patient has been more alert and talkative overall, but she did receive Ativan earlier this morning and is currently quite somnolent.  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  9/27: Patient is more awake and alert and responsive this morning although she remains mostly somnolent.  Repeat EEG pending for reevaluation of seizure activity.  Continue current treatments.  Assessment & Plan:   Principal Problem:   Seizures (HCC) Active Problems:   HYPERCHOLESTEROLEMIA   Essential hypertension   Glucose intolerance   Acute encephalopathy   Hyponatremia   SIRS (systemic inflammatory response syndrome) (HCC)   Cocaine abuse (HCC)   Seizure (HCC)   Acute encephalopathy secondary tostatus epilepticus -Patient currently appears postictal -Continue on Keppra IV 500 mg twice dailywith repeat loading dose on 9/24 per neurology -Patient started on Vimpat 9/24 -EEGwith significant findings reviewed by neurology, repeat 9/27 -Seizure precautions -CT head with no acute findings -Appreciateongoingneurology evaluation -Plan to repeat EEG today with discussion with neurology at bedside  Polysubstance abuse -Noted to be positive for benzodiazepines and cocaine -Likely related to breakthrough seizure versus alcohol withdrawal -Counseling on substance abuse and will need resources in outpatient setting  SIRS criteria -Currently improved with no sign of infection noted -Low procalcitonin -Discontinue antibiotics and monitor -Blood cultures with no growth noted thus far  Hyponatremia-improving -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  Dyslipidemia -Plan to hold statin for now until she can tolerate p.o.  Essential hypertension-elevated -Resume home amlodipine -We will order IV labetalol as needed for significant elevations   DVT prophylaxis:Lovenox Code Status:Full code Family Communication:Husband at bedside 9/26 Disposition Plan: Status is: Inpatient  Remains inpatient appropriate  because:Unsafe d/c plan, IV treatments appropriate due to intensity of illness or inability to take PO and Inpatient level of  care appropriate due to severity of illness   Dispo: The patient is from:Home Anticipated d/c is WN:UUVOto:Home vs SNF Anticipated d/c date is: 2-3 days Patient currently is not medically stable to d/c.Patient still with status epilepticus and is recovering from a mentation standpoint he needs ongoing neurology evaluation.   Consultants:  Neurology  Procedures:  See below, EEG repeat pending  Antimicrobials:   None   Subjective: Patient seen and evaluated today with some improved alertness and responsiveness today.  Objective: Vitals:   02/12/20 2200 02/13/20 0000 02/13/20 0442 02/13/20 0500  BP: (!) 152/98 (!) 150/88 (!) 119/95 (!) 151/96  Pulse: 96 90 95 95  Resp:  16 16 16   Temp:  98.6 F (37 C) 98 F (36.7 C)   TempSrc:  Oral    SpO2:  96% 100% 100%  Weight:      Height:        Intake/Output Summary (Last 24 hours) at 02/13/2020 1000 Last data filed at 02/13/2020 0700 Gross per 24 hour  Intake 90 ml  Output 2500 ml  Net -2410 ml   Filed Weights   02/09/20 1959  Weight: 61.2 kg    Examination:  General exam: Appears calm and comfortable, somnolent but arousable Respiratory system: Clear to auscultation. Respiratory effort normal.  Currently on 2 L nasal cannula oxygen Cardiovascular system: S1 & S2 heard, RRR.  Gastrointestinal system: Abdomen is nondistended, soft and nontender.  Central nervous system: Somnolent but arousable Extremities: No edema Skin: No rashes, lesions or ulcers Psychiatry: Difficult to assess    Data Reviewed: I have personally reviewed following labs and imaging studies  CBC: Recent Labs  Lab 02/09/20 2057 02/10/20 0823 02/11/20 0655 02/12/20 0744 02/13/20 0625  WBC 14.5* 8.8 8.3 7.1 7.8  NEUTROABS 13.4* 6.0 6.5 5.7 5.8  HGB 14.2 12.2 11.4* 12.0  12.5  HCT 43.9 38.6 35.3* 37.2 39.1  MCV 85.7 86.2 85.7 84.4 86.3  PLT 311 246 250 267 262   Basic Metabolic Panel: Recent Labs  Lab 02/09/20 2057 02/10/20 0450 02/11/20 0655 02/12/20 0744 02/13/20 0625  NA 127* 128* 131* 129* 131*  K 3.5 4.0 3.2* 3.0* 3.8  CL 96* 100 100 100 100  CO2 19* 17* 20* 21* 20*  GLUCOSE 134* 110* 101* 112* 79  BUN 10 10 10 6 8   CREATININE 1.08* 0.94 0.88 0.65 0.87  CALCIUM 9.2 8.2* 8.4* 8.5* 8.8*  MG  --   --  1.5* 1.7 1.6*   GFR: Estimated Creatinine Clearance: 66.4 mL/min (by C-G formula based on SCr of 0.87 mg/dL). Liver Function Tests: Recent Labs  Lab 02/09/20 2057 02/10/20 0450 02/11/20 0655  AST 27 27 22   ALT 16 14 13   ALKPHOS 94 71 56  BILITOT 0.8 1.3* 1.0  PROT 8.7* 6.9 5.8*  ALBUMIN 3.9 3.0* 2.6*   No results for input(s): LIPASE, AMYLASE in the last 168 hours. No results for input(s): AMMONIA in the last 168 hours. Coagulation Profile: Recent Labs  Lab 02/09/20 2057  INR 1.0   Cardiac Enzymes: No results for input(s): CKTOTAL, CKMB, CKMBINDEX, TROPONINI in the last 168 hours. BNP (last 3 results) No results for input(s): PROBNP in the last 8760 hours. HbA1C: No results for input(s): HGBA1C in the last 72 hours. CBG: No results for input(s): GLUCAP in the last 168 hours. Lipid Profile: No results for input(s): CHOL, HDL, LDLCALC, TRIG, CHOLHDL, LDLDIRECT in the last 72 hours. Thyroid Function Tests: No results for input(s): TSH, T4TOTAL, FREET4, T3FREE, THYROIDAB  in the last 72 hours. Anemia Panel: No results for input(s): VITAMINB12, FOLATE, FERRITIN, TIBC, IRON, RETICCTPCT in the last 72 hours. Sepsis Labs: Recent Labs  Lab 02/09/20 2057 02/10/20 0105 02/10/20 0450  PROCALCITON  --   --  <0.10  LATICACIDVEN 2.2* 2.0*  --     Recent Results (from the past 240 hour(s))  Respiratory Panel by RT PCR (Flu A&B, Covid) -     Status: None   Collection Time: 02/09/20  8:21 PM   Specimen: Nasopharyngeal  Result  Value Ref Range Status   SARS Coronavirus 2 by RT PCR NEGATIVE NEGATIVE Final    Comment: (NOTE) SARS-CoV-2 target nucleic acids are NOT DETECTED.  The SARS-CoV-2 RNA is generally detectable in upper respiratoy specimens during the acute phase of infection. The lowest concentration of SARS-CoV-2 viral copies this assay can detect is 131 copies/mL. A negative result does not preclude SARS-Cov-2 infection and should not be used as the sole basis for treatment or other patient management decisions. A negative result may occur with  improper specimen collection/handling, submission of specimen other than nasopharyngeal swab, presence of viral mutation(s) within the areas targeted by this assay, and inadequate number of viral copies (<131 copies/mL). A negative result must be combined with clinical observations, patient history, and epidemiological information. The expected result is Negative.  Fact Sheet for Patients:  https://www.moore.com/  Fact Sheet for Healthcare Providers:  https://www.young.biz/  This test is no t yet approved or cleared by the [place] FDA and  has been authorized for detection and/or diagnosis of SARS-CoV-2 by FDA under an Emergency Use Authorization (EUA). This EUA will remain  in effect (meaning this test can be used) for the duration of the COVID-19 declaration under Section 564(b)(1) of the Act, 21 U.S.C. section 360bbb-3(b)(1), unless the authorization is terminated or revoked sooner.     Influenza A by PCR NEGATIVE NEGATIVE Final   Influenza B by PCR NEGATIVE NEGATIVE Final    Comment: (NOTE) The Xpert Xpress SARS-CoV-2/FLU/RSV assay is intended as an aid in  the diagnosis of influenza from Nasopharyngeal swab specimens and  should not be used as a sole basis for treatment. Nasal washings and  aspirates are unacceptable for Xpert Xpress SARS-CoV-2/FLU/RSV  testing.  Fact Sheet for  Patients: https://www.moore.com/  Fact Sheet for Healthcare Providers: https://www.young.biz/  This test is not yet approved or cleared by the [place] FDA and  has been authorized for detection and/or diagnosis of SARS-CoV-2 by  FDA under an Emergency Use Authorization (EUA). This EUA will remain  in effect (meaning this test can be used) for the duration of the  Covid-19 declaration under Section 564(b)(1) of the Act, 21  U.S.C. section 360bbb-3(b)(1), unless the authorization is  terminated or revoked. Performed at [facility], [address], [place], [place] [address]   Blood Culture (routine x 2)     Status: None (Preliminary result)   Collection Time: 02/09/20  8:52 PM   Specimen: BLOOD  Result Value Ref Range Status   Specimen Description BLOOD BLOOD RIGHT ARM  Final   Special Requests   Final    BOTTLES DRAWN AEROBIC AND ANAEROBIC Blood Culture adequate volume   Culture   Final    NO GROWTH 3 DAYS Performed at [facility], [address], [place], [place] [address]    Report Status PENDING  Incomplete  Blood Culture (routine x 2)     Status: None (Preliminary result)   Collection Time: 02/09/20  8:57 PM  Specimen: BLOOD RIGHT HAND  Result Value Ref Range Status   Specimen Description BLOOD RIGHT HAND  Final   Special Requests   Final    BOTTLES DRAWN AEROBIC AND ANAEROBIC Blood Culture adequate volume   Culture   Final    NO GROWTH 3 DAYS Performed at [facility], [address], [place], [place] [address]    Report Status PENDING  Incomplete  Urine culture     Status: None   Collection Time: 02/09/20  9:11 PM   Specimen: In/Out Cath Urine  Result Value Ref Range Status   Specimen Description   Final    IN/OUT CATH URINE Performed at [facility], [address] [place], [place] [address]    Special Requests   Final    NONE Performed at [facility], [address] [place], [place] [address]    Culture   Final     NO GROWTH Performed at [facility], [address], [place], [place] [address]    Report Status 02/11/2020 FINAL  Final         Radiology Studies: No results found.      Scheduled Meds: . amLODipine  10 mg Oral Daily  . Chlorhexidine Gluconate Cloth  6 each Topical Daily  . enoxaparin (LOVENOX) injection  40 mg Subcutaneous Q24H  . folic acid  1 mg Oral Daily  . multivitamin with minerals  1 tablet Oral Daily  . thiamine  100 mg Oral Daily   Or  . thiamine  100 mg Intravenous Daily   Continuous Infusions: . lacosamide (VIMPAT) IV 200 mg (02/12/20 2248)  . levETIRAcetam 1,500 mg (02/13/20 0600)  . magnesium sulfate bolus IVPB 2 g (02/13/20 0940)     LOS: 3 days    Time spent: 35 minutes    Georgine Wiltse D Sherryll Burger, DO [facility]  If 7PM-7AM, please contact night-coverage [email or web address] 02/13/2020, 10:00 AM

## 2020-02-13 NOTE — Procedures (Signed)
°  [facility] Cindy [name] A. Gerilyn Pilgrim, MD     [email or web address]           HISTORY: This is a 59 year old female who presents with recurrent seizures / status epilepticus. She has persistent encephalopathy with concern that she could be having ongoing nonconvulsive epileptic seizures.  MEDICATIONS:  Current Facility-Administered Medications:    acetaminophen (TYLENOL) tablet 650 mg, 650 mg, Oral, Q6H PRN **OR** acetaminophen (TYLENOL) suppository 650 mg, 650 mg, Rectal, Q6H PRN, Bobette Mo, MD   amLODipine (NORVASC) tablet 10 mg, 10 mg, Oral, Daily, Sherryll Burger, Pratik D, DO, 10 mg at 02/13/20 6568   Chlorhexidine Gluconate Cloth 2 % PADS 6 each, 6 each, Topical, Daily, Sherryll Burger, Pratik D, DO   enoxaparin (LOVENOX) injection 40 mg, 40 mg, Subcutaneous, Q24H, Bobette Mo, MD, 40 mg at 02/13/20 1275   feeding supplement (ENSURE ENLIVE) (ENSURE ENLIVE) liquid 237 mL, 237 mL, Oral, BID BM, Shah, Pratik D, DO, 237 mL at 02/13/20 1529   folic acid (FOLVITE) tablet 1 mg, 1 mg, Oral, Daily, Sherryll Burger, Pratik D, DO, 1 mg at 02/13/20 1700   labetalol (NORMODYNE) injection 10 mg, 10 mg, Intravenous, Q2H PRN, Sherryll Burger, Pratik D, DO, 10 mg at 02/10/20 1843   lacosamide (VIMPAT) 200 mg in sodium chloride 0.9 % 25 mL IVPB, 200 mg, Intravenous, Q12H, Tonia Avino, MD, Stopped at 02/13/20 1203   levETIRAcetam (KEPPRA) IVPB 1500 mg/ 100 mL premix, 1,500 mg, Intravenous, Q12H, Ronell Duffus, MD, Last Rate: 400 mL/hr at 02/13/20 1733, 1,500 mg at 02/13/20 1733   LORazepam (ATIVAN) injection 1 mg, 1 mg, Intravenous, Q4H PRN, Sherryll Burger, Pratik D, DO, 1 mg at 02/12/20 0616   LORazepam (ATIVAN) tablet 1-4 mg, 1-4 mg, Oral, Q1H PRN **OR** LORazepam (ATIVAN) injection 1-4 mg, 1-4 mg, Intravenous, Q1H PRN, Sherryll Burger, Pratik D, DO, 1 mg at 02/12/20 1317   multivitamin with minerals tablet 1 tablet, 1 tablet, Oral, Daily, Sherryll Burger, Pratik D, DO, 1 tablet at 02/13/20 0928   ondansetron (ZOFRAN) tablet 4 mg, 4 mg,  Oral, Q6H PRN **OR** ondansetron (ZOFRAN) injection 4 mg, 4 mg, Intravenous, Q6H PRN, Bobette Mo, MD   thiamine tablet 100 mg, 100 mg, Oral, Daily, 100 mg at 02/13/20 1749 **OR** thiamine (B-1) injection 100 mg, 100 mg, Intravenous, Daily, Sherryll Burger, Pratik D, DO     ANALYSIS: A 16 channel recording using standard 10 20 measurements is conducted for 23 minutes.  The background posterior rhythm gets as high as 7-8 hertz. There is beta activity observed in the frontal areas. The recording however is replete with theta and even delta slowing. Photic stimulation and hyperventilation are not carried out. There is no focal or lateral slowing noted. There is no clear epileptiform activities noted.   IMPRESSION: 1. This recording shows moderate global slowing indicating a moderate global encephalopathy. However, no epileptiform discharges are noted.      Cindy [name] A. Gerilyn Pilgrim, M.D.  Diplomate, Biomedical engineer of Psychiatry and Neurology ( Neurology).

## 2020-02-13 NOTE — Progress Notes (Signed)
[facility] Cindy [name] A. Cindy Pilgrim, MD     [email or web address]          Cindy [name] is an 59 y.o. female.   ASSESSMENT/PLAN: 1.  Status epilepticus -resolved: Repeat EEG is pending today.  Continue with Keppra and Vimpat. 2.  Altered mental status likely due to multiple seizures/status epilepticus and possible medication effect from benzodiazepines. 3.  Polysubstance drug abuse including alcohol and cocaine 4.  Previous lacunar infarcts 5.  Hypertension 6.  Extensive chronic microvascular changes on imaging which increases the likelihood of marked cognitive impairment and gait impairment long-term.    The patient has multiple episodes of unresponsiveness.  She also gets agitated and is getting Ativan.  Not clear if the unresponsiveness is due to the Ativan or possibly postictal.  No clear convulsive seizures however.  History indicates that the patient has not been on the Keppra for about 2 months.  She also quit drinking alcohol several days before presenting to the hospital suggested that the partner with her seizures could be due to alcohol withdrawal.  GENERAL: The patient is laying in bed with eyes closed under the covers.  HEENT: Mild tongue bite trauma noted on each side.  Neck is supple.  ABDOMEN: soft  EXTREMITIES: No edema   BACK: Normal  SKIN: Normal by inspection.    MENTAL STATUS: She lays in bed with eyes closed.  She does open her eyes to verbal commands.  She sees more spontaneous and responsive than she was on my previous evaluation.  She does follow midline commands and appendicular commands consistently and the promptly.  She perseverates as she was on the previous evaluation.  She perseverates that she is doing well but does not response to questions of orientation appropriately.    CRANIAL NERVES: Pupils are equal, round and reactive to light; extra ocular movements are full, there is no significant nystagmus; visual fields are full; upper and lower  facial muscles are normal in strength and symmetric, there is no flattening of the nasolabial folds; tongue is midline; uvula is midline; shoulder elevation is normal.  MOTOR: Normal ton and bulk are normal; strength at least 4/5 arms and 3/5 legs; no pronator drift.  COORDINATION: No tremor, dysmetria or parkinsonism.   REFLEXES: Deep tendon reflexes are symmetrical and normal.   SENSATION: Normal to pain.      Blood pressure (!) 151/96, pulse 95, temperature 98 F (36.7 C), resp. rate 16, height 5\' 7"  (1.702 m), weight 61.2 kg, SpO2 100 %.  Past Medical History:  Diagnosis Date  . Abnormal LFTs    Hx of  . CVA (cerebral vascular accident) (HCC) 2005  . Hyperlipidemia   . Hypertension   . Polysubstance dependence including opioid type drug, episodic abuse (HCC)    cocaine/etoh     Past Surgical History:  Procedure Laterality Date  . ESOPHAGOGASTRODUODENOSCOPY N/A 08/16/2019   Procedure: ESOPHAGOGASTRODUODENOSCOPY (EGD);  Surgeon: 08/18/2019, MD;  Location: [facility] ENDOSCOPY;  Service: Endoscopy;  Laterality: N/A;    Family History  Problem Relation Age of Onset  . Cancer Father        prostate   . Hypertension Mother   . Asthma Mother   . Bronchitis Mother     Social History:  reports that she has never smoked. She has never used smokeless tobacco. She reports current alcohol use. She reports that she does not use drugs.  Allergies: No Known Allergies  Medications: Prior to Admission medications   Medication Sig Start Date  End Date Taking? Authorizing Provider  amLODipine (NORVASC) 10 MG tablet TAKE 1 TABLET BY MOUTH EVERY DAY (NEEDS TO MAKE AN APPOINTMENT) 08/26/19   Angiulli, Cindy Rossettianiel J, PA-C  clopidogrel (PLAVIX) 75 MG tablet Take 1 tablet (75 mg total) by mouth daily. 08/26/19   Angiulli, Cindy Rossettianiel J, PA-C  levETIRAcetam (KEPPRA XR) 500 MG 24 hr tablet Take 1 tablet (500 mg total) by mouth daily. 08/26/19   Angiulli, Cindy Rossettianiel J, PA-C  Multiple Vitamin (MULTIVITAMIN) LIQD Take  15 mLs by mouth daily. 08/20/19   Amin, Cindy HaltAnkit Chirag, MD  pantoprazole (PROTONIX) 40 MG tablet Take 1 tablet (40 mg total) by mouth 2 (two) times daily before a meal. 08/26/19   Angiulli, Cindy Rossettianiel J, PA-C  QUEtiapine (SEROQUEL) 25 MG tablet Take 1 tablet (25 mg total) by mouth at bedtime. 08/26/19   Angiulli, Cindy Rossettianiel J, PA-C  sennosides (SENOKOT) 8.8 MG/5ML syrup Place 5 mLs into feeding tube 2 (two) times daily as needed for mild constipation. 08/19/19   Amin, Cindy HaltAnkit Chirag, MD  simvastatin (ZOCOR) 20 MG tablet Take 1 tablet (20 mg total) by mouth daily. 08/26/19   Angiulli, Cindy Rossettianiel J, PA-C    Scheduled Meds: . amLODipine  10 mg Oral Daily  . Chlorhexidine Gluconate Cloth  6 each Topical Daily  . enoxaparin (LOVENOX) injection  40 mg Subcutaneous Q24H  . folic acid  1 mg Oral Daily  . multivitamin with minerals  1 tablet Oral Daily  . thiamine  100 mg Oral Daily   Or  . thiamine  100 mg Intravenous Daily   Continuous Infusions: . lacosamide (VIMPAT) IV 200 mg (02/12/20 2248)  . levETIRAcetam 1,500 mg (02/13/20 0600)  . magnesium sulfate bolus IVPB     PRN Meds:.acetaminophen **OR** acetaminophen, labetalol, LORazepam, LORazepam **OR** LORazepam, ondansetron **OR** ondansetron (ZOFRAN) IV     Results for orders placed or performed during the hospital encounter of 02/09/20 (from the past 48 hour(s))  CBC with Differential     Status: Abnormal   Collection Time: 02/12/20  7:44 AM  Result Value Ref Range   WBC 7.1 4.0 - 10.5 K/uL   RBC 4.41 3.87 - 5.11 MIL/uL   Hemoglobin 12.0 12.0 - 15.0 g/dL   HCT 16.137.2 36 - 46 %   MCV 84.4 80.0 - 100.0 fL   MCH 27.2 26.0 - 34.0 pg   MCHC 32.3 30.0 - 36.0 g/dL   RDW 09.615.0 04.511.5 - 40.915.5 %   Platelets 267 150 - 400 K/uL   nRBC 0.0 0.0 - 0.2 %   Neutrophils Relative % 80 %   Neutro Abs 5.7 1.7 - 7.7 K/uL   Lymphocytes Relative 7 %   Lymphs Abs 0.5 (L) 0.7 - 4.0 K/uL   Monocytes Relative 11 %   Monocytes Absolute 0.8 0 - 1 K/uL   Eosinophils Relative 1 %    Eosinophils Absolute 0.0 0 - 0 K/uL   Basophils Relative 1 %   Basophils Absolute 0.0 0 - 0 K/uL   Immature Granulocytes 0 %   Abs Immature Granulocytes 0.02 0.00 - 0.07 K/uL    Comment: Performed at [facility], [address] [place], [place] [id number]  Basic metabolic panel     Status: Abnormal   Collection Time: 02/12/20  7:44 AM  Result Value Ref Range   Sodium 129 (L) 135 - 145 mmol/L   Potassium 3.0 (L) 3.5 - 5.1 mmol/L   Chloride 100 98 - 111 mmol/L   CO2 21 (L) 22 - 32  mmol/L   Glucose, Bld 112 (H) 70 - 99 mg/dL    Comment: Glucose reference range applies only to samples taken after fasting for at least 8 hours.   BUN 6 6 - 20 mg/dL   Creatinine, Ser 3.81 0.44 - 1.00 mg/dL   Calcium 8.5 (L) 8.9 - 10.3 mg/dL   GFR calc non Af Amer >60 >60 mL/min   GFR calc Af Amer >60 >60 mL/min   Anion gap 8 5 - 15    Comment: Performed at [facility], [address] [place], [place] [address]  Magnesium     Status: None   Collection Time: 02/12/20  7:44 AM  Result Value Ref Range   Magnesium 1.7 1.7 - 2.4 mg/dL    Comment: Performed at [facility], [address] [place], [place] [address]  CBC with Differential     Status: Abnormal   Collection Time: 02/13/20  6:25 AM  Result Value Ref Range   WBC 7.8 4.0 - 10.5 K/uL   RBC 4.53 3.87 - 5.11 MIL/uL   Hemoglobin 12.5 12.0 - 15.0 g/dL   HCT 78.9 36 - 46 %   MCV 86.3 80.0 - 100.0 fL   MCH 27.6 26.0 - 34.0 pg   MCHC 32.0 30.0 - 36.0 g/dL   RDW 38.1 (H) 01.7 - 51.0 %   Platelets 262 150 - 400 K/uL   nRBC 0.0 0.0 - 0.2 %   Neutrophils Relative % 73 %   Neutro Abs 5.8 1.7 - 7.7 K/uL   Lymphocytes Relative 11 %   Lymphs Abs 0.8 0.7 - 4.0 K/uL   Monocytes Relative 13 %   Monocytes Absolute 1.0 0 - 1 K/uL   Eosinophils Relative 1 %   Eosinophils Absolute 0.1 0 - 0 K/uL   Basophils Relative 1 %   Basophils Absolute 0.1 0 - 0 K/uL   Immature Granulocytes 1 %   Abs Immature Granulocytes 0.04 0.00 - 0.07 K/uL    Comment: Performed at  [facility], [address], [place], [place] [address]  Basic metabolic panel     Status: Abnormal   Collection Time: 02/13/20  6:25 AM  Result Value Ref Range   Sodium 131 (L) 135 - 145 mmol/L   Potassium 3.8 3.5 - 5.1 mmol/L    Comment: DELTA CHECK NOTED   Chloride 100 98 - 111 mmol/L   CO2 20 (L) 22 - 32 mmol/L   Glucose, Bld 79 70 - 99 mg/dL    Comment: Glucose reference range applies only to samples taken after fasting for at least 8 hours.   BUN 8 6 - 20 mg/dL   Creatinine, Ser 7.78 0.44 - 1.00 mg/dL   Calcium 8.8 (L) 8.9 - 10.3 mg/dL   GFR calc non Af Amer >60 >60 mL/min   GFR calc Af Amer >60 >60 mL/min   Anion gap 11 5 - 15    Comment: Performed at [facility], [address] [place], [place] [address]  Magnesium     Status: Abnormal   Collection Time: 02/13/20  6:25 AM  Result Value Ref Range   Magnesium 1.6 (L) 1.7 - 2.4 mg/dL    Comment: Performed at [facility], [address], [place], [place] [address]    Studies/Results:  BRAIN MRI 07-2019 FINDINGS: Brain: Scattered small foci of restricted diffusion are seen within the bilateral cerebral hemispheres, consistent with small acute infarcts, likely embolic.   No hemorrhage, hydrocephalus, extra-axial collection or mass lesion.  Scattered confluent foci of T2 hyperintensity are seen within the white matter of the cerebral hemispheres, within the thalami, pons and cerebellum, nonspecific remote lacunar infarcts are seen in the bilateral basal ganglia, bilateral centrum semiovale and corona radiata at the, right middle cerebellar peduncle and left cerebellar hemisphere. Findings are more pronounced than expected for age.   Prominence of the cerebral sulci and ventricular system reflecting parenchymal volume loss.   Vascular: Normal flow voids. Single A2 from incidentally noted   Skull and upper cervical spine: Normal marrow signal.   Sinuses/Orbits: Mucosal thickening of the maxillary sinuses, sphenoid  sinuses and ethmoid cells. The orbits are maintained.   Other: Bilateral mastoid effusions.   IMPRESSION: 1. Scattered small foci of restricted diffusion within the bilateral cerebral hemispheres, consistent with small acute infarcts, likely embolic. 2. Advanced white matter disease, more pronounced than expected for age. 3. Mild inflammatory paranasal sinus disease and bilateral mastoid Effusions.    HEAD CT 02-09-2020 FINDINGS: Brain: No acute hemorrhage. Generalized atrophy, mild but age advanced. Advanced periventricular white matter hypodensity consistent with chronic small vessel ischemia, particularly advanced for age. Lacunar infarct in the right basal ganglia. Remote but new from March 2021. Additional tiny bilateral basal gangliar lacunar infarcts are unchanged. No evidence of acute ischemia. No subdural or extra-axial collection.   Vascular: Atherosclerosis of skullbase vasculature without hyperdense vessel or abnormal calcification.   Skull: No fracture or focal lesion.   Sinuses/Orbits: Fluid level with frothy debris in the left side of sphenoid sinus. Remaining paranasal sinuses are clear. No mastoid effusion. Orbits are unremarkable.   Other: None.   IMPRESSION: 1. No acute intracranial abnormality. 2. Generalized atrophy and chronic small vessel ischemia similar to prior exam but advanced for age. Lacunar infarct in the right basal ganglia appears remote, but new from March 2021. 3. Fluid level with frothy debris in the left side of the sphenoid sinus, can be seen with acute sinusitis.     Both the head CT scan in the brain scan are reviewed. Brain MRI scan was done in March of this year. 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 Remote infarct is noted right basal ganglia on CT not seen on previous imaging. There is also remote infarct left basal ganglia seen on the MRI from March.  This is a  critical patient who is in status epilepticus requiring multiple discussion with the referring providers and staff in coordination of care. Critical care time 90 minutes.      Kelden Lavallee A. Cindy [name], M.D.  Diplomate, Biomedical engineer of Psychiatry and Neurology ( Neurology). 02/13/2020, 8:38 AM

## 2020-02-13 NOTE — Progress Notes (Signed)
Patient became agitated, trying to climb out of bed.  She pulled out one of her IVs and pulled out her PureWick.  She was combative, trying to "go home."  Two mg ativan were given.  Patient calmed down and allowed the  Administration of her Keppra.

## 2020-02-14 LAB — MAGNESIUM: Magnesium: 1.9 mg/dL (ref 1.7–2.4)

## 2020-02-14 LAB — BASIC METABOLIC PANEL
Anion gap: 11 (ref 5–15)
BUN: 13 mg/dL (ref 6–20)
CO2: 20 mmol/L — ABNORMAL LOW (ref 22–32)
Calcium: 9 mg/dL (ref 8.9–10.3)
Chloride: 99 mmol/L (ref 98–111)
Creatinine, Ser: 0.88 mg/dL (ref 0.44–1.00)
GFR calc Af Amer: >60 mL/min (ref >60)
GFR calc non Af Amer: >60 mL/min (ref >60)
Glucose, Bld: 95 mg/dL (ref 70–99)
Potassium: 3.5 mmol/L (ref 3.5–5.1)
Sodium: 130 mmol/L — ABNORMAL LOW (ref 135–145)

## 2020-02-14 MED ORDER — LACOSAMIDE 50 MG PO TABS
200.0000 mg | ORAL_TABLET | Freq: Two times a day (BID) | ORAL | Status: DC
  Administered 2020-02-15 – 2020-02-17 (×5): 200 mg via ORAL
  Filled 2020-02-14 (×7): qty 4

## 2020-02-14 MED ORDER — LEVETIRACETAM 500 MG PO TABS
1500.0000 mg | ORAL_TABLET | Freq: Two times a day (BID) | ORAL | Status: DC
  Administered 2020-02-15 – 2020-02-17 (×5): 1500 mg via ORAL
  Filled 2020-02-14 (×7): qty 3

## 2020-02-14 MED ORDER — SIMVASTATIN 20 MG PO TABS
20.0000 mg | ORAL_TABLET | Freq: Every day | ORAL | Status: DC
  Administered 2020-02-15 – 2020-02-16 (×2): 20 mg via ORAL
  Filled 2020-02-14 (×3): qty 1

## 2020-02-14 MED ORDER — POTASSIUM CHLORIDE CRYS ER 20 MEQ PO TBCR
40.0000 meq | EXTENDED_RELEASE_TABLET | Freq: Once | ORAL | Status: AC
  Administered 2020-02-14: 40 meq via ORAL
  Filled 2020-02-14: qty 2

## 2020-02-14 NOTE — Progress Notes (Signed)
MD notified of pt CIWA 24. PRN medication given (see mar). Will reassess

## 2020-02-14 NOTE — Progress Notes (Signed)
PT Cancellation Note  Patient Details Name: Cindy [name] MRN: [id number] DOB: [birth year]   Cancelled Treatment:    Reason Eval/Treat Not Completed: Fatigue/lethargy limiting ability to participate. Per nursing, pt received ativan and is now resting. Pt resting quietly upon arrival. Will continue to check back as schedule permits.   Domenick Bookbinder PT, DPT 02/14/20, 2:08 PM [phone]

## 2020-02-14 NOTE — Progress Notes (Addendum)
[facility] Cindy Malson A. Gerilyn Pilgrimoonquah, MD     [email or web address]          Cindy [name] is an 59 y.o. female.   ASSESSMENT/PLAN: 1.  Status epilepticus -resolved:  Continue with Keppra and Vimpat.  We should switch the medications to oral formulation 2.  Altered mental status likely due to multiple seizures/status epilepticus and possible medication effect from benzodiazepines. 3.  Polysubstance drug abuse including alcohol and cocaine 4.  Previous lacunar infarcts 5.  Hypertension 6.  Extensive chronic microvascular changes on imaging which increases the likelihood of marked cognitive impairment and gait impairment long-term.    She had an episode of confusion last night trying to get out of bed and pull out her IV.  She was given Ativan.  She seems calm today and actually more responsive.    GENERAL: The patient is laying in bed with eyes closed.  HEENT: Mild tongue bite trauma noted on each side.  Neck is supple.  ABDOMEN: soft  EXTREMITIES: No edema   BACK: Normal  SKIN: Normal by inspection.    MENTAL STATUS: She lays in bed with eyes closed.  She does open her eyes to verbal commands.  She sees more spontaneous and responsive than she was on my previous evaluations.  She does follow midline commands and appendicular commands consistently and the promptly.  She knows he is in the hospital but is not oriented to time.  She states that is 1992.  She continues to perseverates.    CRANIAL NERVES: Pupils are equal, round and reactive to light; extra ocular movements are full, there is no significant nystagmus; visual fields are full; upper and lower facial muscles are normal in strength and symmetric, there is no flattening of the nasolabial folds; tongue is midline; uvula is midline; shoulder elevation is normal.  MOTOR: Normal ton and bulk are normal; strength at least 4/5 arms and 3/5 legs; no pronator drift.  COORDINATION: No tremor, dysmetria or parkinsonism.    REFLEXES: Deep tendon reflexes are symmetrical and normal.   SENSATION: Normal to pain.      Blood pressure (!) 155/91, pulse 99, temperature 98.7 F (37.1 C), resp. rate 20, height 5\' 7"  (1.702 m), weight 61.2 kg, SpO2 100 %.  Past Medical History:  Diagnosis Date  . Abnormal LFTs    Hx of  . CVA (cerebral vascular accident) (HCC) 2005  . Hyperlipidemia   . Hypertension   . Polysubstance dependence including opioid type drug, episodic abuse (HCC)    cocaine/etoh     Past Surgical History:  Procedure Laterality Date  . ESOPHAGOGASTRODUODENOSCOPY N/A 08/16/2019   Procedure: ESOPHAGOGASTRODUODENOSCOPY (EGD);  Surgeon: Vida RiggerMagod, Marc, MD;  [facility];  Service: Endoscopy;  Laterality: N/A;    Family History  Problem Relation Age of Onset  . Cancer Father        prostate   . Hypertension Mother   . Asthma Mother   . Bronchitis Mother     Social History:  reports that she has never smoked. She has never used smokeless tobacco. She reports current alcohol use. She reports that she does not use drugs.  Allergies: No Known Allergies  Medications: Prior to Admission medications   Medication Sig Start Date End Date Taking? Authorizing Provider  amLODipine (NORVASC) 10 MG tablet TAKE 1 TABLET BY MOUTH EVERY DAY (NEEDS TO MAKE AN APPOINTMENT) 08/26/19   Angiulli, Mcarthur Rossettianiel J, PA-C  clopidogrel (PLAVIX) 75 MG tablet Take 1 tablet (75 mg total) by mouth  daily. 08/26/19   Angiulli, Mcarthur Rossetti, PA-C  levETIRAcetam (KEPPRA XR) 500 MG 24 hr tablet Take 1 tablet (500 mg total) by mouth daily. 08/26/19   Angiulli, Mcarthur Rossetti, PA-C  Multiple Vitamin (MULTIVITAMIN) LIQD Take 15 mLs by mouth daily. 08/20/19   Amin, Loura Halt, MD  pantoprazole (PROTONIX) 40 MG tablet Take 1 tablet (40 mg total) by mouth 2 (two) times daily before a meal. 08/26/19   Angiulli, Mcarthur Rossetti, PA-C  QUEtiapine (SEROQUEL) 25 MG tablet Take 1 tablet (25 mg total) by mouth at bedtime. 08/26/19   Angiulli, Mcarthur Rossetti, PA-C   sennosides (SENOKOT) 8.8 MG/5ML syrup Place 5 mLs into feeding tube 2 (two) times daily as needed for mild constipation. 08/19/19   Amin, Loura Halt, MD  simvastatin (ZOCOR) 20 MG tablet Take 1 tablet (20 mg total) by mouth daily. 08/26/19   Angiulli, Mcarthur Rossetti, PA-C    Scheduled Meds: . amLODipine  10 mg Oral Daily  . Chlorhexidine Gluconate Cloth  6 each Topical Daily  . enoxaparin (LOVENOX) injection  40 mg Subcutaneous Q24H  . feeding supplement (ENSURE ENLIVE)  237 mL Oral BID BM  . folic acid  1 mg Oral Daily  . multivitamin with minerals  1 tablet Oral Daily  . thiamine  100 mg Oral Daily   Or  . thiamine  100 mg Intravenous Daily   Continuous Infusions: . lacosamide (VIMPAT) IV 200 mg (02/13/20 2350)  . levETIRAcetam 1,500 mg (02/14/20 0544)   PRN Meds:.acetaminophen **OR** acetaminophen, labetalol, LORazepam, LORazepam **OR** LORazepam, ondansetron **OR** ondansetron (ZOFRAN) IV     Results for orders placed or performed during the hospital encounter of 02/09/20 (from the past 48 hour(s))  CBC with Differential     Status: Abnormal   Collection Time: 02/13/20  6:25 AM  Result Value Ref Range   WBC 7.8 4.0 - 10.5 K/uL   RBC 4.53 3.87 - 5.11 MIL/uL   Hemoglobin 12.5 12.0 - 15.0 g/dL   HCT 01.0 36 - 46 %   MCV 86.3 80.0 - 100.0 fL   MCH 27.6 26.0 - 34.0 pg   MCHC 32.0 30.0 - 36.0 g/dL   RDW 27.2 (H) 53.6 - 64.4 %   Platelets 262 150 - 400 K/uL   nRBC 0.0 0.0 - 0.2 %   Neutrophils Relative % 73 %   Neutro Abs 5.8 1.7 - 7.7 K/uL   Lymphocytes Relative 11 %   Lymphs Abs 0.8 0.7 - 4.0 K/uL   Monocytes Relative 13 %   Monocytes Absolute 1.0 0 - 1 K/uL   Eosinophils Relative 1 %   Eosinophils Absolute 0.1 0 - 0 K/uL   Basophils Relative 1 %   Basophils Absolute 0.1 0 - 0 K/uL   Immature Granulocytes 1 %   Abs Immature Granulocytes 0.04 0.00 - 0.07 K/uL    Comment: Performed at [facility], [address], [place], [place] [address]  Basic metabolic panel     Status:  Abnormal   Collection Time: 02/13/20  6:25 AM  Result Value Ref Range   Sodium 131 (L) 135 - 145 mmol/L   Potassium 3.8 3.5 - 5.1 mmol/L    Comment: DELTA CHECK NOTED   Chloride 100 98 - 111 mmol/L   CO2 20 (L) 22 - 32 mmol/L   Glucose, Bld 79 70 - 99 mg/dL    Comment: Glucose reference range applies only to samples taken after fasting for at least 8 hours.   BUN 8 6 -  20 mg/dL   Creatinine, Ser 9.93 0.44 - 1.00 mg/dL   Calcium 8.8 (L) 8.9 - 10.3 mg/dL   GFR calc non Af Amer >60 >60 mL/min   GFR calc Af Amer >60 >60 mL/min   Anion gap 11 5 - 15    Comment: Performed at [facility], [address], [place], [place] [address]  Magnesium     Status: Abnormal   Collection Time: 02/13/20  6:25 AM  Result Value Ref Range   Magnesium 1.6 (L) 1.7 - 2.4 mg/dL    Comment: Performed at [facility], [address] [place], [place] [address]  Basic metabolic panel     Status: Abnormal   Collection Time: 02/14/20  3:05 AM  Result Value Ref Range   Sodium 130 (L) 135 - 145 mmol/L   Potassium 3.5 3.5 - 5.1 mmol/L   Chloride 99 98 - 111 mmol/L   CO2 20 (L) 22 - 32 mmol/L   Glucose, Bld 95 70 - 99 mg/dL    Comment: Glucose reference range applies only to samples taken after fasting for at least 8 hours.   BUN 13 6 - 20 mg/dL   Creatinine, Ser 3.00 0.44 - 1.00 mg/dL   Calcium 9.0 8.9 - 92.3 mg/dL   GFR calc non Af Amer >60 >60 mL/min   GFR calc Af Amer >60 >60 mL/min   Anion gap 11 5 - 15    Comment: Performed at [facility], [address], [place], [place] [address]  Magnesium     Status: None   Collection Time: 02/14/20  3:05 AM  Result Value Ref Range   Magnesium 1.9 1.7 - 2.4 mg/dL    Comment: Performed at [facility], [address] [place], [place] [address]    Studies/Results:  BRAIN MRI 07-2019 FINDINGS: Brain: Scattered small foci of restricted diffusion are seen within the bilateral cerebral hemispheres, consistent with small acute infarcts, likely embolic.   No  hemorrhage, hydrocephalus, extra-axial collection or mass lesion.   Scattered confluent foci of T2 hyperintensity are seen within the white matter of the cerebral hemispheres, within the thalami, pons and cerebellum, nonspecific remote lacunar infarcts are seen in the bilateral basal ganglia, bilateral centrum semiovale and corona radiata at the, right middle cerebellar peduncle and left cerebellar hemisphere. Findings are more pronounced than expected for age.   Prominence of the cerebral sulci and ventricular system reflecting parenchymal volume loss.   Vascular: Normal flow voids. Single A2 from incidentally noted   Skull and upper cervical spine: Normal marrow signal.   Sinuses/Orbits: Mucosal thickening of the maxillary sinuses, sphenoid sinuses and ethmoid cells. The orbits are maintained.   Other: Bilateral mastoid effusions.   IMPRESSION: 1. Scattered small foci of restricted diffusion within the bilateral cerebral hemispheres, consistent with small acute infarcts, likely embolic. 2. Advanced white matter disease, more pronounced than expected for age. 3. Mild inflammatory paranasal sinus disease and bilateral mastoid Effusions.    HEAD CT 02-09-2020 FINDINGS: Brain: No acute hemorrhage. Generalized atrophy, mild but age advanced. Advanced periventricular white matter hypodensity consistent with chronic small vessel ischemia, particularly advanced for age. Lacunar infarct in the right basal ganglia. Remote but new from March 2021. Additional tiny bilateral basal gangliar lacunar infarcts are unchanged. No evidence of acute ischemia. No subdural or extra-axial collection.   Vascular: Atherosclerosis of skullbase vasculature without hyperdense vessel or abnormal calcification.   Skull: No fracture or focal lesion.   Sinuses/Orbits: Fluid level with frothy debris in the  left side of sphenoid sinus. Remaining paranasal sinuses are clear. No mastoid effusion. Orbits  are unremarkable.   Other: None.   IMPRESSION: 1. No acute intracranial abnormality. 2. Generalized atrophy and chronic small vessel ischemia similar to prior exam but advanced for age. Lacunar infarct in the right basal ganglia appears remote, but new from March 2021. 3. Fluid level with frothy debris in the left side of the sphenoid sinus, can be seen with acute sinusitis.     Both the head CT scan in the brain scan are reviewed. Brain MRI scan was done in March of this year. 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 Remote infarct is noted right basal ganglia on CT not seen on previous imaging. There is also remote infarct left basal ganglia seen on the MRI from March.  This is a critical patient who is in status epilepticus requiring multiple discussion with the referring providers and staff in coordination of care. Critical care time 90 minutes.      Amiylah Anastos A. Gerilyn Pilgrim, M.D.  Diplomate, Biomedical engineer of Psychiatry and Neurology ( Neurology). 02/14/2020, 9:06 AM

## 2020-02-14 NOTE — Progress Notes (Signed)
MD notified of po medications (see mar) not given due to pt drowsiness from CIWA interventions.

## 2020-02-14 NOTE — Progress Notes (Signed)
PROGRESS NOTE    Cindy [name]  ZYY:[id number] DOB: 10/24/1960 DOA: 02/09/2020 PCP: Avon Gully, MD   Brief Narrative:  Per HPI: Cindy [name] a 59 y.o.femalewith medical history significant ofLFTs abnormality history, history of multiple CVAs, hyperlipidemia, hypertension, polysubstance abuse including opioids and cocaine, seizure disorder who is brought via EMS to the emergency department after the patient started having seizures. EMS was initiallycalledbecause of family with patient being lethargic presents 24 hours or so. When EMS arrived the patient was actively seizing and she received 5 mg of IV midazolam. He was febrile in the emergency department without any obvious source. She is still confused, does not follow simple commands and is unable to provide further information.  9/24:Patient was admitted with what appears to be a breakthrough seizure in the setting of positive cocaine use. She has received Ativan as well as 1 g of IV Keppra and will be started on Keppra 500 mg IV twice daily. EEG ordered with neurology consultation pending. She still appears quite somnolent. She was initially started on some antibiotics empirically, but procalcitonin is low and no other signs of infection or noted,therefore these will be discontinued. A.m. labs ordered.  9/25:Patient noted to be more awake and responsive this morning, but states "yes" to every question and does not answer questions appropriately. She is improved from yesterday and has been started on Vimpat with repeat loading dose of Keppra yesterday by neurology. Continue to monitor through today and consider EEG if she does not have adequate neurological improvement by 9/26.  9/26:Per nursing staff, patient has been more alert and talkative overall, but she did receive Ativan earlier this morning and is currently quite somnolent. 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  9/27: Patient is more awake and alert and responsive this morning although she remains mostly somnolent.  Repeat EEG pending for reevaluation of seizure activity.  Continue current treatments.  9/28: Patient remains alert and responsive this morning with no acute overnight events noted.  Repeat EEG with no seizure activity noted.  Plans to switch to oral antiepileptics per neurology which has been now ordered.  PT evaluation ordered as well, with likely need for placement to a facility, but insurance remains an issue per CSW.  Assessment & Plan:   Principal Problem:   Seizures (HCC) Active Problems:   HYPERCHOLESTEROLEMIA   Essential hypertension   Glucose intolerance   Acute encephalopathy   Hyponatremia   SIRS (systemic inflammatory response syndrome) (HCC)   Cocaine abuse (HCC)   Seizure (HCC)   Acute encephalopathy secondary tostatus epilepticus-improved -Patient currently more awake and alert -Continue now on Keppra 1500 mg twice daily and Vimpat 200 mg twice daily oral per neurology -EEGwith significant findings reviewed by neurology, repeat 9/27 with no seizure activity noted -CT head with no acute findings -Appreciateongoingneurology evaluation -Plan for PT evaluation today  Polysubstance abuse -Noted to be positive for benzodiazepines and cocaine -Likely related to breakthrough seizureversus alcohol withdrawal -Counseling on substance abuse and will need resources in outpatient setting  SIRS criteria -Currently improved with no sign of infection noted -Low procalcitonin -Discontinue antibiotics and monitor -Blood cultures with no growth noted thus far  Hyponatremia-improving -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 -TSHwithin normal limits -Plan to discontinue IV normal saline and maintain on fluid restriction  Dyslipidemia -Plan to resume statin today  Essential hypertension-elevated -Resume home amlodipine -We will order IV labetalol as needed for  significant elevations   DVT prophylaxis:Lovenox Code  Status:Full code Family Communication:Husband at bedside 9/26 Disposition Plan: Status is: Inpatient  Remains inpatient appropriate because:Unsafe d/c plan, IV treatments appropriate due to intensity of illness or inability to take PO and Inpatient level of care appropriate due to severity of illness   Dispo: The patient is from:Home Anticipated d/c is ZW:[facility] home, although SNF would be appropriate (insurance issue) Anticipated d/c date is:1-2 days Patient currently is not medically stable to d/c.Patient has been transitioned from IV to oral antiepileptics today.  PT evaluation pending.   Consultants:  Neurology  Procedures:  See below, EEG repeat pending  Antimicrobials:   None   Subjective: Patient seen and evaluated today with no acute overnight events noted.  She remains awake and alert this morning.  Mentation appears to be slowly improving.  Objective: Vitals:   02/13/20 0500 02/13/20 1328 02/13/20 2115 02/14/20 0650  BP: (!) 151/96 134/82 124/78 (!) 155/91  Pulse: 95 96 (!) 117 99  Resp: 16 16 20 20   Temp:  97.9 F (36.6 C) 99.6 F (37.6 C) 98.7 F (37.1 C)  TempSrc:   Oral   SpO2: 100% 100% 99% 100%  Weight:      Height:        Intake/Output Summary (Last 24 hours) at 02/14/2020 1104 Last data filed at 02/14/2020 0700 Gross per 24 hour  Intake 450 ml  Output 2 ml  Net 448 ml   Filed Weights   02/09/20 1959  Weight: 61.2 kg    Examination:  General exam: Appears calm and comfortable  Respiratory system: Clear to auscultation. Respiratory effort normal. Cardiovascular system: S1 & S2 heard, RRR.  Gastrointestinal system: Abdomen is nondistended, soft and nontender. Central nervous system: Alert and awake Extremities: No edema Skin: No rashes, lesions or ulcers Psychiatry: Judgement and insight appear normal. Mood & affect  appropriate.     Data Reviewed: I have personally reviewed following labs and imaging studies  CBC: Recent Labs  Lab 02/09/20 2057 02/10/20 0823 02/11/20 0655 02/12/20 0744 02/13/20 0625  WBC 14.5* 8.8 8.3 7.1 7.8  NEUTROABS 13.4* 6.0 6.5 5.7 5.8  HGB 14.2 12.2 11.4* 12.0 12.5  HCT 43.9 38.6 35.3* 37.2 39.1  MCV 85.7 86.2 85.7 84.4 86.3  PLT 311 246 250 267 262   Basic Metabolic Panel: Recent Labs  Lab 02/10/20 0450 02/11/20 0655 02/12/20 0744 02/13/20 0625 02/14/20 0305  NA 128* 131* 129* 131* 130*  K 4.0 3.2* 3.0* 3.8 3.5  CL 100 100 100 100 99  CO2 17* 20* 21* 20* 20*  GLUCOSE 110* 101* 112* 79 95  BUN 10 10 6 8 13   CREATININE 0.94 0.88 0.65 0.87 0.88  CALCIUM 8.2* 8.4* 8.5* 8.8* 9.0  MG  --  1.5* 1.7 1.6* 1.9   GFR: Estimated Creatinine Clearance: 65.7 mL/min (by C-G formula based on SCr of 0.88 mg/dL). Liver Function Tests: Recent Labs  Lab 02/09/20 2057 02/10/20 0450 02/11/20 0655  AST 27 27 22   ALT 16 14 13   ALKPHOS 94 71 56  BILITOT 0.8 1.3* 1.0  PROT 8.7* 6.9 5.8*  ALBUMIN 3.9 3.0* 2.6*   No results for input(s): LIPASE, AMYLASE in the last 168 hours. No results for input(s): AMMONIA in the last 168 hours. Coagulation Profile: Recent Labs  Lab 02/09/20 2057  INR 1.0   Cardiac Enzymes: No results for input(s): CKTOTAL, CKMB, CKMBINDEX, TROPONINI in the last 168 hours. BNP (last 3 results) No results for input(s): PROBNP in the last 8760 hours. HbA1C: No results  for input(s): HGBA1C in the last 72 hours. CBG: No results for input(s): GLUCAP in the last 168 hours. Lipid Profile: No results for input(s): CHOL, HDL, LDLCALC, TRIG, CHOLHDL, LDLDIRECT in the last 72 hours. Thyroid Function Tests: No results for input(s): TSH, T4TOTAL, FREET4, T3FREE, THYROIDAB in the last 72 hours. Anemia Panel: No results for input(s): VITAMINB12, FOLATE, FERRITIN, TIBC, IRON, RETICCTPCT in the last 72 hours. Sepsis Labs: Recent Labs  Lab 02/09/20 2057  02/10/20 0105 02/10/20 0450  PROCALCITON  --   --  <0.10  LATICACIDVEN 2.2* 2.0*  --     Recent Results (from the past 240 hour(s))  Respiratory Panel by RT PCR (Flu A&B, Covid) -     Status: None   Collection Time: 02/09/20  8:21 PM   Specimen: Nasopharyngeal  Result Value Ref Range Status   SARS Coronavirus 2 by RT PCR NEGATIVE NEGATIVE Final    Comment: (NOTE) SARS-CoV-2 target nucleic acids are NOT DETECTED.  The SARS-CoV-2 RNA is generally detectable in upper respiratoy specimens during the acute phase of infection. The lowest concentration of SARS-CoV-2 viral copies this assay can detect is 131 copies/mL. A negative result does not preclude SARS-Cov-2 infection and should not be used as the sole basis for treatment or other patient management decisions. A negative result may occur with  improper specimen collection/handling, submission of specimen other than nasopharyngeal swab, presence of viral mutation(s) within the areas targeted by this assay, and inadequate number of viral copies (<131 copies/mL). A negative result must be combined with clinical observations, patient history, and epidemiological information. The expected result is Negative.  Fact Sheet for Patients:  https://www.moore.com/  Fact Sheet for Healthcare Providers:  https://www.young.biz/  This test is no t yet approved or cleared by the [place] FDA and  has been authorized for detection and/or diagnosis of SARS-CoV-2 by FDA under an Emergency Use Authorization (EUA). This EUA will remain  in effect (meaning this test can be used) for the duration of the COVID-19 declaration under Section 564(b)(1) of the Act, 21 U.S.C. section 360bbb-3(b)(1), unless the authorization is terminated or revoked sooner.     Influenza A by PCR NEGATIVE NEGATIVE Final   Influenza B by PCR NEGATIVE NEGATIVE Final    Comment: (NOTE) The Xpert Xpress SARS-CoV-2/FLU/RSV assay is  intended as an aid in  the diagnosis of influenza from Nasopharyngeal swab specimens and  should not be used as a sole basis for treatment. Nasal washings and  aspirates are unacceptable for Xpert Xpress SARS-CoV-2/FLU/RSV  testing.  Fact Sheet for Patients: https://www.moore.com/  Fact Sheet for Healthcare Providers: https://www.young.biz/  This test is not yet approved or cleared by the [place] FDA and  has been authorized for detection and/or diagnosis of SARS-CoV-2 by  FDA under an Emergency Use Authorization (EUA). This EUA will remain  in effect (meaning this test can be used) for the duration of the  Covid-19 declaration under Section 564(b)(1) of the Act, 21  U.S.C. section 360bbb-3(b)(1), unless the authorization is  terminated or revoked. Performed at [facility], [address] [place], [place] [address]   Blood Culture (routine x 2)     Status: None (Preliminary result)   Collection Time: 02/09/20  8:52 PM   Specimen: BLOOD  Result Value Ref Range Status   Specimen Description BLOOD BLOOD RIGHT ARM  Final   Special Requests   Final    BOTTLES DRAWN AEROBIC AND ANAEROBIC Blood Culture adequate volume   Culture   Final  NO GROWTH 4 DAYS Performed at [facility], [address], [place], [place] [address]    Report Status PENDING  Incomplete  Blood Culture (routine x 2)     Status: None (Preliminary result)   Collection Time: 02/09/20  8:57 PM   Specimen: BLOOD RIGHT HAND  Result Value Ref Range Status   Specimen Description BLOOD RIGHT HAND  Final   Special Requests   Final    BOTTLES DRAWN AEROBIC AND ANAEROBIC Blood Culture adequate volume   Culture   Final    NO GROWTH 4 DAYS Performed at [facility], [address] [place], [place] [address]    Report Status PENDING  Incomplete  Urine culture     Status: None   Collection Time: 02/09/20  9:11 PM   Specimen: In/Out Cath Urine  Result Value Ref Range Status    Specimen Description   Final    IN/OUT CATH URINE Performed at [facility], [address], [place], [place] [address]    Special Requests   Final    NONE Performed at [facility], [address], [place], [place] [address]    Culture   Final    NO GROWTH Performed at [facility], [address] [place], [place] [address]    Report Status 02/11/2020 FINAL  Final         Radiology Studies: EEG adult  Result Date: Feb 21, 2020 Beryle Beams, MD     02/21/2020  5:36 PM [facility] Kofi A. Gerilyn Pilgrim, MD     [email or web address]       HISTORY: This is a 59 year old female who presents with recurrent seizures / status epilepticus. She has persistent encephalopathy with concern that she could be having ongoing nonconvulsive epileptic seizures. MEDICATIONS: Current Facility-Administered Medications: .  acetaminophen (TYLENOL) tablet 650 mg, 650 mg, Oral, Q6H PRN **OR** acetaminophen (TYLENOL) suppository 650 mg, 650 mg, Rectal, Q6H PRN, Bobette Mo, MD .  amLODipine (NORVASC) tablet 10 mg, 10 mg, Oral, Daily, Sherryll Burger, Chenise Mulvihill D, DO, 10 mg at 2020-02-21 5784 .  Chlorhexidine Gluconate Cloth 2 % PADS 6 each, 6 each, Topical, Daily, Sherryll Burger, Ivaan Liddy D, DO .  enoxaparin (LOVENOX) injection 40 mg, 40 mg, Subcutaneous, Q24H, Bobette Mo, MD, 40 mg at 2020-02-21 6962 .  feeding supplement (ENSURE ENLIVE) (ENSURE ENLIVE) liquid 237 mL, 237 mL, Oral, BID BM, Sherryll Burger, Iniya Matzek D, DO, 237 mL at Feb 21, 2020 1529 .  folic acid (FOLVITE) tablet 1 mg, 1 mg, Oral, Daily, Sherryll Burger, Luie Laneve D, DO, 1 mg at 02/21/2020 9528 .  labetalol (NORMODYNE) injection 10 mg, 10 mg, Intravenous, Q2H PRN, Sherryll Burger, Jenney Brester D, DO, 10 mg at 02/10/20 1843 .  lacosamide (VIMPAT) 200 mg in sodium chloride 0.9 % 25 mL IVPB, 200 mg, Intravenous, Q12H, Beryle Beams, MD, Stopped at 21-Feb-2020 1203 .  levETIRAcetam (KEPPRA) IVPB 1500 mg/ 100 mL premix, 1,500 mg, Intravenous, Q12H, [place], Kofi, MD, Last Rate: 400 mL/hr at 2020-02-21 1733,  1,500 mg at 21-Feb-2020 1733 .  LORazepam (ATIVAN) injection 1 mg, 1 mg, Intravenous, Q4H PRN, Sherryll Burger, Yahia Bottger D, DO, 1 mg at 02/12/20 0616 .  LORazepam (ATIVAN) tablet 1-4 mg, 1-4 mg, Oral, Q1H PRN **OR** LORazepam (ATIVAN) injection 1-4 mg, 1-4 mg, Intravenous, Q1H PRN, Sherryll Burger, Usbaldo Pannone D, DO, 1 mg at 02/12/20 1317 .  multivitamin with minerals tablet 1 tablet, 1 tablet, Oral, Daily, Sherryll Burger, Lennox Dolberry D, DO, 1 tablet at 02/21/20 4132 .  ondansetron (ZOFRAN) tablet 4 mg, 4 mg, Oral, Q6H PRN **OR** ondansetron (  ZOFRAN) injection 4 mg, 4 mg, Intravenous, Q6H PRN, Bobette Mortiz, David Manuel, MD .  thiamine tablet 100 mg, 100 mg, Oral, Daily, 100 mg at 02/13/20 [id number] **OR** thiamine (B-1) injection 100 mg, 100 mg, Intravenous, Daily, Sherryll BurgerShah, Ahja Martello D, DO ANALYSIS: A 16 channel recording using standard 10 20 measurements is conducted for 23 minutes.  The background posterior rhythm gets as high as 7-8 hertz. There is beta activity observed in the frontal areas. The recording however is replete with theta and even delta slowing. Photic stimulation and hyperventilation are not carried out. There is no focal or lateral slowing noted. There is no clear epileptiform activities noted. IMPRESSION: 1. This recording shows moderate global slowing indicating a moderate global encephalopathy. However, no epileptiform discharges are noted. Kofi A. Gerilyn Pilgrimoonquah, M.D. Diplomate, Biomedical engineerAmerican Board of Psychiatry and Neurology ( Neurology).        Scheduled Meds: . amLODipine  10 mg Oral Daily  . Chlorhexidine Gluconate Cloth  6 each Topical Daily  . enoxaparin (LOVENOX) injection  40 mg Subcutaneous Q24H  . feeding supplement (ENSURE ENLIVE)  237 mL Oral BID BM  . folic acid  1 mg Oral Daily  . lacosamide  200 mg Oral BID  . levETIRAcetam  1,500 mg Oral BID  . multivitamin with minerals  1 tablet Oral Daily  . potassium chloride  40 mEq Oral Once  . thiamine  100 mg Oral Daily   Or  . thiamine  100 mg Intravenous Daily      LOS: 4 days     Time spent: 30 minutes    Merary Garguilo Hoover Brunette Jermiya Reichl, DO [facility]  If 7PM-7AM, please contact night-coverage [email or web address] 02/14/2020, 11:04 AM

## 2020-02-14 NOTE — TOC Progression Note (Addendum)
Transition of Care [facility]) - Progression Note    Patient Details  Name: Cindy [name] MRN: [id number] Date of Birth: Feb 17, 1961  [facility] CM/SW Contact  Leitha Bleak, RN Phone Number: 02/14/2020, 10:34 AM  Clinical Narrative:   Patient admitted with Seizures. Patient continues to only be oriented to self. Patient having delirium issues. Patient has no insurance. Per MD, patient will not be able to care for herself due to drug and alcohol use and not taking medication.  Patient husband is also not able to care for her. TOC called Ingrid. She had been worked up before and did not qualify for [facility]. Jerene Dilling will reassess.   TOC to follow.  Addendum Jerene Dilling passed this patient on to [facility] for Medicaid assessment.  Expected Discharge Plan: Skilled Nursing Facility Barriers to Discharge: Continued Medical Work up, Barriers Unresolved (comment) (NO  INSURANCE)  Expected Discharge Plan and Services Expected Discharge Plan: Skilled Nursing Facility       Living arrangements for the past 2 months: Single Family Home

## 2020-02-15 DIAGNOSIS — E871 Hypo-osmolality and hyponatremia: Secondary | ICD-10-CM

## 2020-02-15 DIAGNOSIS — I10 Essential (primary) hypertension: Secondary | ICD-10-CM

## 2020-02-15 DIAGNOSIS — R7309 Other abnormal glucose: Secondary | ICD-10-CM

## 2020-02-15 DIAGNOSIS — E7800 Pure hypercholesterolemia, unspecified: Secondary | ICD-10-CM

## 2020-02-15 DIAGNOSIS — R6510 Systemic inflammatory response syndrome (SIRS) of non-infectious origin without acute organ dysfunction: Secondary | ICD-10-CM

## 2020-02-15 LAB — BASIC METABOLIC PANEL
Anion gap: 10 (ref 5–15)
BUN: 14 mg/dL (ref 6–20)
CO2: 22 mmol/L (ref 22–32)
Calcium: 9 mg/dL (ref 8.9–10.3)
Chloride: 96 mmol/L — ABNORMAL LOW (ref 98–111)
Creatinine, Ser: 0.77 mg/dL (ref 0.44–1.00)
GFR calc Af Amer: >60 mL/min (ref >60)
GFR calc non Af Amer: >60 mL/min (ref >60)
Glucose, Bld: 99 mg/dL (ref 70–99)
Potassium: 3.7 mmol/L (ref 3.5–5.1)
Sodium: 128 mmol/L — ABNORMAL LOW (ref 135–145)

## 2020-02-15 LAB — CULTURE, BLOOD (ROUTINE X 2)
Culture: NO GROWTH
Culture: NO GROWTH
Special Requests: ADEQUATE
Special Requests: ADEQUATE

## 2020-02-15 LAB — CBC
HCT: 39.1 % (ref 36.0–46.0)
Hemoglobin: 12.6 g/dL (ref 12.0–15.0)
MCH: 27.5 pg (ref 26.0–34.0)
MCHC: 32.2 g/dL (ref 30.0–36.0)
MCV: 85.2 fL (ref 80.0–100.0)
Platelets: 283 10*3/uL (ref 150–400)
RBC: 4.59 MIL/uL (ref 3.87–5.11)
RDW: 15.7 % — ABNORMAL HIGH (ref 11.5–15.5)
WBC: 9.2 10*3/uL (ref 4.0–10.5)
nRBC: 0 % (ref 0.0–0.2)

## 2020-02-15 LAB — MAGNESIUM: Magnesium: 1.6 mg/dL — ABNORMAL LOW (ref 1.7–2.4)

## 2020-02-15 MED ORDER — METOPROLOL TARTRATE 25 MG PO TABS
25.0000 mg | ORAL_TABLET | Freq: Two times a day (BID) | ORAL | Status: DC
  Administered 2020-02-15 – 2020-02-17 (×5): 25 mg via ORAL
  Filled 2020-02-15 (×6): qty 1

## 2020-02-15 MED ORDER — SODIUM CHLORIDE 0.9 % IV SOLN
200.0000 mg | Freq: Two times a day (BID) | INTRAVENOUS | Status: AC
  Administered 2020-02-15: 200 mg via INTRAVENOUS
  Filled 2020-02-15: qty 20

## 2020-02-15 MED ORDER — LACOSAMIDE 200 MG/20ML IV SOLN
INTRAVENOUS | Status: AC
  Filled 2020-02-15: qty 20

## 2020-02-15 MED ORDER — LEVETIRACETAM IN NACL 1500 MG/100ML IV SOLN
1500.0000 mg | Freq: Two times a day (BID) | INTRAVENOUS | Status: AC
  Administered 2020-02-15: 1500 mg via INTRAVENOUS
  Filled 2020-02-15: qty 100

## 2020-02-15 MED ORDER — MAGNESIUM SULFATE 4 GM/100ML IV SOLN
4.0000 g | Freq: Once | INTRAVENOUS | Status: AC
  Administered 2020-02-15: 4 g via INTRAVENOUS
  Filled 2020-02-15: qty 100

## 2020-02-15 NOTE — Progress Notes (Signed)
PROGRESS NOTE    Cindy [name]  WGN:[id number] DOB: 1960/05/22 DOA: 02/09/2020 PCP: Cindy Gully, MD   Brief Narrative:  Per HPI: Cindy [name] a 59 y.o.femalewith medical history significant ofLFTs abnormality history, history of multiple CVAs, hyperlipidemia, hypertension, polysubstance abuse including opioids and cocaine, seizure disorder who is brought via EMS to the emergency department after the patient started having seizures. EMS was initiallycalledbecause of family with patient being lethargic presents 24 hours or so. When EMS arrived the patient was actively seizing and she received 5 mg of IV midazolam. He was febrile in the emergency department without any obvious source. She is still confused, does not follow simple commands and is unable to provide further information.  9/24:Patient was admitted with what appears to be a breakthrough seizure in the setting of positive cocaine use. She has received Ativan as well as 1 g of IV Keppra and will be started on Keppra 500 mg IV twice daily. EEG ordered with neurology consultation pending. She still appears quite somnolent. She was initially started on some antibiotics empirically, but procalcitonin is low and no other signs of infection or noted,therefore these will be discontinued. A.m. labs ordered.  9/25:Patient noted to be more awake and responsive this morning, but states "yes" to every question and does not answer questions appropriately. She is improved from yesterday and has been started on Vimpat with repeat loading dose of Keppra yesterday by neurology. Continue to monitor through today and consider EEG if she does not have adequate neurological improvement by 9/26.  9/26:Per nursing staff, patient has been more alert and talkative [name], but she did receive Ativan earlier this morning and is currently quite somnolent. 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  9/27: Patient is more awake and alert and responsive this morning although she remains mostly somnolent.  Repeat EEG pending for reevaluation of seizure activity.  Continue current treatments.  9/28: Patient remains alert and responsive this morning with no acute overnight events noted.  Repeat EEG with no seizure activity noted.  Plans to switch to oral antiepileptics per neurology which has been now ordered.  PT evaluation ordered as well, with likely need for placement to a facility, but insurance remains an issue per CSW.  9/29:  Pt slightly more responsive.  No further seizure activities.  Tolerating oral meds.  Unable to discharge due to [facility] working out safe discharge plan as patient has no insurance, not qualifying for medicaid at this time, TOC is telling me that patient will have to discharge home with home health services.   Assessment & Plan:   Principal Problem:   Seizures (HCC) Active Problems:   HYPERCHOLESTEROLEMIA   Essential hypertension   Glucose intolerance   Acute encephalopathy   Hyponatremia   SIRS (systemic inflammatory response syndrome) (HCC)   Cocaine abuse (HCC)   Seizure (HCC)   Acute encephalopathy secondary tostatus epilepticus-improved -Patient currently more awake and alert -Continue now on Keppra 1500 mg twice daily and Vimpat 200 mg twice daily oral per neurology -EEGwith significant findings reviewed by neurology, repeat 9/27 with no seizure activity noted -CT head with no acute findings -Appreciateongoingneurology evaluation -PT recommending SNF however with no insurance and substance abuse placement unlikely per TOC  Polysubstance abuse -Noted to be positive for benzodiazepines and cocaine -Likely related to breakthrough seizureversus alcohol withdrawal -Counseling on substance abuse and will need resources in outpatient setting -TOC consulted.   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 -TSHwithin normal limits -Plan to discontinue IV normal saline and maintain on fluid restriction  Dyslipidemia -Plan to resume statin today  Essential hypertension-elevated -Resume home amlodipine, added metoprolol 25 mg BID -We will order IV labetalol as needed for significant elevations  DVT prophylaxis:Lovenox Code Status:Full code Family Communication:Husband at bedside 9/26 Disposition Plan: Status is: Inpatient  Remains inpatient appropriate because:Unsafe d/c plan, IV treatments appropriate due to intensity of illness or inability to take PO and Inpatient level of care appropriate due to severity of illness  Dispo: The patient is from:Home Anticipated d/c is GM:[facility] home, although SNF would be appropriate (insurance issue) Anticipated d/c date is:1 days Patient currently is not medically stable to d/c.Plan DC 9/30 to home with [facility] pending final TOC recommendations regarding safe discharge plan.   Consultants:  Neurology  Procedures:  See below, EEG repeat pending  Antimicrobials:   None   Subjective: Patient reports no seizure activity.   Objective: Vitals:   02/14/20 1933 02/14/20 2123 02/14/20 2359 02/15/20 0634  BP: (!) 173/93 (!) 158/90 (!) 142/95 (!) 159/98  Pulse: (!) 138 (!) 115 (!) 101 (!) 110  Resp:  20 20 20   Temp:  98.7 F (37.1 C) 99.2 F (37.3 C) 98 F (36.7 C)  TempSrc:      SpO2:  100% 100% 96%  Weight:      Height:        Intake/Output Summary (Last 24 hours) at 02/15/2020 1240 Last data filed at 02/15/2020 0800 Gross per 24 hour  Intake 505 ml  Output 500 ml  Net 5 ml   Filed Weights   02/09/20 1959  Weight: 61.2 kg    Examination:  General exam: Appears calm and comfortable  Respiratory system: Clear to  auscultation. Respiratory effort normal. Cardiovascular system: S1 & S2 heard, RRR.  Gastrointestinal system: Abdomen is nondistended, soft and nontender. Central nervous system: Alert and awake Extremities: No edema Skin: No rashes, lesions or ulcers Psychiatry: Judgement and insight appear normal. Mood & affect appropriate.   Data Reviewed: I have personally reviewed following labs and imaging studies  CBC: Recent Labs  Lab 02/09/20 2057 02/09/20 2057 02/10/20 0823 02/11/20 0655 02/12/20 0744 02/13/20 0625 02/15/20 0619  WBC 14.5*   < > 8.8 8.3 7.1 7.8 9.2  NEUTROABS 13.4*  --  6.0 6.5 5.7 5.8  --   HGB 14.2   < > 12.2 11.4* 12.0 12.5 12.6  HCT 43.9   < > 38.6 35.3* 37.2 39.1 39.1  MCV 85.7   < > 86.2 85.7 84.4 86.3 85.2  PLT 311   < > 246 250 267 262 283   < > = values in this interval not displayed.   Basic Metabolic Panel: Recent Labs  Lab 02/11/20 0655 02/12/20 0744 02/13/20 0625 02/14/20 0305 02/15/20 0619  NA 131* 129* 131* 130* 128*  K 3.2* 3.0* 3.8 3.5 3.7  CL 100 100 100 99 96*  CO2 20* 21* 20* 20* 22  GLUCOSE 101* 112* 79 95 99  BUN 10 6 8 13 14   CREATININE 0.88 0.65 0.87 0.88 0.77  CALCIUM 8.4* 8.5* 8.8* 9.0 9.0  MG 1.5* 1.7 1.6* 1.9 1.6*   GFR: Estimated Creatinine Clearance: 72.3 mL/min (by C-G formula based on SCr of 0.77 mg/dL). Liver Function Tests: Recent Labs  Lab 02/09/20 2057 02/10/20  0450 02/11/20 0655  AST 27 27 22   ALT 16 14 13   ALKPHOS 94 71 56  BILITOT 0.8 1.3* 1.0  PROT 8.7* 6.9 5.8*  ALBUMIN 3.9 3.0* 2.6*   No results for input(s): LIPASE, AMYLASE in the last 168 hours. No results for input(s): AMMONIA in the last 168 hours. Coagulation Profile: Recent Labs  Lab 02/09/20 2057  INR 1.0   Cardiac Enzymes: No results for input(s): CKTOTAL, CKMB, CKMBINDEX, TROPONINI in the last 168 hours. BNP (last 3 results) No results for input(s): PROBNP in the last 8760 hours. HbA1C: No results for input(s): HGBA1C in the last 72  hours. CBG: No results for input(s): GLUCAP in the last 168 hours. Lipid Profile: No results for input(s): CHOL, HDL, LDLCALC, TRIG, CHOLHDL, LDLDIRECT in the last 72 hours. Thyroid Function Tests: No results for input(s): TSH, T4TOTAL, FREET4, T3FREE, THYROIDAB in the last 72 hours. Anemia Panel: No results for input(s): VITAMINB12, FOLATE, FERRITIN, TIBC, IRON, RETICCTPCT in the last 72 hours. Sepsis Labs: Recent Labs  Lab 02/09/20 2057 02/10/20 0105 02/10/20 0450  PROCALCITON  --   --  <0.10  LATICACIDVEN 2.2* 2.0*  --     Recent Results (from the past 240 hour(s))  Respiratory Panel by RT PCR (Flu A&B, Covid) -     Status: None   Collection Time: 02/09/20  8:21 PM   Specimen: Nasopharyngeal  Result Value Ref Range Status   SARS Coronavirus 2 by RT PCR NEGATIVE NEGATIVE Final    Comment: (NOTE) SARS-CoV-2 target nucleic acids are NOT DETECTED.  The SARS-CoV-2 RNA is generally detectable in upper respiratoy specimens during the acute phase of infection. The lowest concentration of SARS-CoV-2 viral copies this assay can detect is 131 copies/mL. A negative result does not preclude SARS-Cov-2 infection and should not be used as the sole basis for treatment or other patient management decisions. A negative result may occur with  improper specimen collection/handling, submission of specimen other than nasopharyngeal swab, presence of viral mutation(s) within the areas targeted by this assay, and inadequate number of viral copies (<131 copies/mL). A negative result must be combined with clinical observations, patient history, and epidemiological information. The expected result is Negative.  Fact Sheet for Patients:  https://www.moore.com/https://www.fda.gov/media/142436/download  Fact Sheet for Healthcare Providers:  https://www.young.biz/https://www.fda.gov/media/142435/download  This test is no t yet approved or cleared by the [place] FDA and  has been authorized for detection and/or diagnosis of SARS-CoV-2  by FDA under an Emergency Use Authorization (EUA). This EUA will remain  in effect (meaning this test can be used) for the duration of the COVID-19 declaration under Section 564(b)(1) of the Act, 21 U.S.C. section 360bbb-3(b)(1), unless the authorization is terminated or revoked sooner.     Influenza A by PCR NEGATIVE NEGATIVE Final   Influenza B by PCR NEGATIVE NEGATIVE Final    Comment: (NOTE) The Xpert Xpress SARS-CoV-2/FLU/RSV assay is intended as an aid in  the diagnosis of influenza from Nasopharyngeal swab specimens and  should not be used as a sole basis for treatment. Nasal washings and  aspirates are unacceptable for Xpert Xpress SARS-CoV-2/FLU/RSV  testing.  Fact Sheet for Patients: https://www.moore.com/https://www.fda.gov/media/142436/download  Fact Sheet for Healthcare Providers: https://www.young.biz/https://www.fda.gov/media/142435/download  This test is not yet approved or cleared by the [place] FDA and  has been authorized for detection and/or diagnosis of SARS-CoV-2 by  FDA under an Emergency Use Authorization (EUA). This EUA will remain  in effect (meaning this test can be used) for the duration of the  Covid-19 declaration under Section 564(b)(1) of the Act, 21  U.S.C. section 360bbb-3(b)(1), unless the authorization is  terminated or revoked. Performed at [facility], [address] [place], [place] [address]   Blood Culture (routine x 2)     Status: None   Collection Time: 02/09/20  8:52 PM   Specimen: BLOOD  Result Value Ref Range Status   Specimen Description BLOOD BLOOD RIGHT ARM  Final   Special Requests   Final    BOTTLES DRAWN AEROBIC AND ANAEROBIC Blood Culture adequate volume   Culture   Final    NO GROWTH 6 DAYS Performed at [facility], [address], [place], [place] [address]    Report Status 02/15/2020 FINAL  Final  Blood Culture (routine x 2)     Status: None   Collection Time: 02/09/20  8:57 PM   Specimen: BLOOD RIGHT HAND  Result Value Ref Range Status   Specimen  Description BLOOD RIGHT HAND  Final   Special Requests   Final    BOTTLES DRAWN AEROBIC AND ANAEROBIC Blood Culture adequate volume   Culture   Final    NO GROWTH 6 DAYS Performed at [facility], [address] [place], [place] [address]    Report Status 02/15/2020 FINAL  Final  Urine culture     Status: None   Collection Time: 02/09/20  9:11 PM   Specimen: In/Out Cath Urine  Result Value Ref Range Status   Specimen Description   Final    IN/OUT CATH URINE Performed at [facility], [address] [place], [place] [address]    Special Requests   Final    NONE Performed at [facility], [address] [place], [place] [address]    Culture   Final    NO GROWTH Performed at [facility], [address] [place], [place] [address]    Report Status 02/11/2020 FINAL  Final    Radiology Studies: EEG adult  Result Date: 16-Feb-2020 Beryle Beams, MD     February 16, 2020  5:36 PM [facility] Kofi A. Gerilyn Pilgrim, MD     [email or web address]       HISTORY: This is a 59 year old female who presents with recurrent seizures / status epilepticus. She has persistent encephalopathy with concern that she could be having ongoing nonconvulsive epileptic seizures. MEDICATIONS: Current Facility-Administered Medications: .  acetaminophen (TYLENOL) tablet 650 mg, 650 mg, Oral, Q6H PRN **OR** acetaminophen (TYLENOL) suppository 650 mg, 650 mg, Rectal, Q6H PRN, Bobette Mo, MD .  amLODipine (NORVASC) tablet 10 mg, 10 mg, Oral, Daily, Sherryll Burger, Pratik D, DO, 10 mg at February 16, 2020 3546 .  Chlorhexidine Gluconate Cloth 2 % PADS 6 each, 6 each, Topical, Daily, Sherryll Burger, Pratik D, DO .  enoxaparin (LOVENOX) injection 40 mg, 40 mg, Subcutaneous, Q24H, Bobette Mo, MD, 40 mg at 16-Feb-2020 5681 .  feeding supplement (ENSURE ENLIVE) (ENSURE ENLIVE) liquid 237 mL, 237 mL, Oral, BID BM, Sherryll Burger, Pratik D, DO, 237 mL at 2020/02/16 1529 .  folic acid (FOLVITE) tablet 1 mg, 1 mg, Oral, Daily, Sherryll Burger, Pratik D, DO, 1 mg at  2020/02/16 2751 .  labetalol (NORMODYNE) injection 10 mg, 10 mg, Intravenous, Q2H PRN, Sherryll Burger, Pratik D, DO, 10 mg at 02/10/20 1843 .  lacosamide (VIMPAT) 200 mg in sodium chloride 0.9 % 25 mL IVPB, 200 mg, Intravenous, Q12H, Beryle Beams, MD, Stopped at 02/16/20 1203 .  levETIRAcetam (KEPPRA) IVPB 1500 mg/ 100 mL premix, 1,500 mg, Intravenous, Q12H, [place], Kofi, MD, Last Rate: 400 mL/hr  at 02/13/20 1733, 1,500 mg at 02/13/20 1733 .  LORazepam (ATIVAN) injection 1 mg, 1 mg, Intravenous, Q4H PRN, Sherryll Burger, Pratik D, DO, 1 mg at 02/12/20 0616 .  LORazepam (ATIVAN) tablet 1-4 mg, 1-4 mg, Oral, Q1H PRN **OR** LORazepam (ATIVAN) injection 1-4 mg, 1-4 mg, Intravenous, Q1H PRN, Sherryll Burger, Pratik D, DO, 1 mg at 02/12/20 1317 .  multivitamin with minerals tablet 1 tablet, 1 tablet, Oral, Daily, Sherryll Burger, Pratik D, DO, 1 tablet at 02/13/20 9509 .  ondansetron (ZOFRAN) tablet 4 mg, 4 mg, Oral, Q6H PRN **OR** ondansetron (ZOFRAN) injection 4 mg, 4 mg, Intravenous, Q6H PRN, Bobette Mo, MD .  thiamine tablet 100 mg, 100 mg, Oral, Daily, 100 mg at 02/13/20 3267 **OR** thiamine (B-1) injection 100 mg, 100 mg, Intravenous, Daily, Sherryll Burger, Pratik D, DO ANALYSIS: A 16 channel recording using standard 10 20 measurements is conducted for 23 minutes.  The background posterior rhythm gets as high as 7-8 hertz. There is beta activity observed in the frontal areas. The recording however is replete with theta and even delta slowing. Photic stimulation and hyperventilation are not carried out. There is no focal or lateral slowing noted. There is no clear epileptiform activities noted. IMPRESSION: 1. This recording shows moderate global slowing indicating a moderate global encephalopathy. However, no epileptiform discharges are noted. Kofi A. Gerilyn Pilgrim, M.D. Diplomate, Biomedical engineer of Psychiatry and Neurology ( Neurology).   Scheduled Meds: . amLODipine  10 mg Oral Daily  . Chlorhexidine Gluconate Cloth  6 each Topical Daily  . enoxaparin  (LOVENOX) injection  40 mg Subcutaneous Q24H  . feeding supplement (ENSURE ENLIVE)  237 mL Oral BID BM  . folic acid  1 mg Oral Daily  . lacosamide  200 mg Oral BID  . levETIRAcetam  1,500 mg Oral BID  . multivitamin with minerals  1 tablet Oral Daily  . simvastatin  20 mg Oral q1800  . thiamine  100 mg Oral Daily   Or  . thiamine  100 mg Intravenous Daily     LOS: 5 days   Time spent: 30 minutes  Markelle Najarian Laural Benes, MD How to contact the [facility] Attending or Consulting provider 7A - 7P or covering provider during after hours 7P -7A, for this patient?  1. Check the care team in [facility] and look for a) attending/consulting [facility] provider listed and b) the [facility] team listed 2. Log into [email or web address] and use [facility] universal password to access. If you do not have the password, please contact the hospital operator. 3. Locate the [facility] provider you are looking for under [facility] and page to a number that you can be directly reached. 4. If you still have difficulty reaching the provider, please page the [facility] (Director on Call) for the Hospitalists listed on amion for assistance.  [facility]  If 7PM-7AM, please contact night-coverage [email or web address] 02/15/2020, 12:40 PM

## 2020-02-15 NOTE — Progress Notes (Signed)
[facility] Cindy [name] A. Cindy Pilgrimoonquah, MD     [email or web address]          Treasa SchoolLisa S [name] is an 59 y.o. female.   ASSESSMENT/PLAN: 1.  Status epilepticus -resolved:  Continue with Keppra and Vimpat.   2.  Altered mental status likely due to multiple seizures/status epilepticus and possible medication effect from benzodiazepines.  I suspect that this should gradually improve.  No evidence of ongoing seizure at this time. 3.  Polysubstance drug abuse including alcohol and cocaine 4.  Previous lacunar infarcts 5.  Hypertension 6.  Extensive chronic microvascular changes on imaging which increases the likelihood of marked cognitive impairment and gait impairment long-term.    It appears that her agitation is improved.  She still has mittens on.  No clinical seizures noted.   GENERAL: The patient is laying in bed with eyes closed.  HEENT: Mild tongue bite trauma noted on each side.  Neck is supple.  ABDOMEN: soft  EXTREMITIES: No edema   BACK: Normal  SKIN: Normal by inspection.    MENTAL STATUS: She lays in bed with eyes closed.  She opens her eyes to verbal commands.  She does follow simple midline and appendicular commands.  She is a little irritated today.  She is noted to have significant dysarthria.  She knows she is in the hospital and continues to perseverate at times.  CRANIAL NERVES: Pupils are equal, round and reactive to light; extra ocular movements are full, there is no significant nystagmus; visual fields are full; upper and lower facial muscles are normal in strength and symmetric, there is no flattening of the nasolabial folds; tongue is midline; uvula is midline; shoulder elevation is normal.  MOTOR: Normal ton and bulk are normal; strength at least 4/5 arms and 3/5 legs; no pronator drift.  COORDINATION: No tremor, dysmetria or parkinsonism.   REFLEXES: Deep tendon reflexes are symmetrical and normal.   SENSATION: Normal to pain.      Blood pressure  128/83, pulse 98, temperature 98.4 F (36.9 C), temperature source Oral, resp. rate 20, height 5\' 7"  (1.702 m), weight 61.2 kg, SpO2 98 %.  Past Medical History:  Diagnosis Date  . Abnormal LFTs    Hx of  . CVA (cerebral vascular accident) (HCC) 2005  . Hyperlipidemia   . Hypertension   . Polysubstance dependence including opioid type drug, episodic abuse (HCC)    cocaine/etoh     Past Surgical History:  Procedure Laterality Date  . ESOPHAGOGASTRODUODENOSCOPY N/A 08/16/2019   Procedure: ESOPHAGOGASTRODUODENOSCOPY (EGD);  Surgeon: Vida RiggerMagod, Marc, MD;  [facility];  Service: Endoscopy;  Laterality: N/A;    Family History  Problem Relation Age of Onset  . Cancer Father        prostate   . Hypertension Mother   . Asthma Mother   . Bronchitis Mother     Social History:  reports that she has never smoked. She has never used smokeless tobacco. She reports current alcohol use. She reports that she does not use drugs.  Allergies: No Known Allergies  Medications: Prior to Admission medications   Medication Sig Start Date End Date Taking? Authorizing Provider  amLODipine (NORVASC) 10 MG tablet TAKE 1 TABLET BY MOUTH EVERY DAY (NEEDS TO MAKE AN APPOINTMENT) 08/26/19   Angiulli, Mcarthur Rossettianiel J, PA-C  clopidogrel (PLAVIX) 75 MG tablet Take 1 tablet (75 mg total) by mouth daily. 08/26/19   Angiulli, Mcarthur Rossettianiel J, PA-C  levETIRAcetam (KEPPRA XR) 500 MG 24 hr tablet Take 1 tablet (500  mg total) by mouth daily. 08/26/19   Angiulli, Mcarthur Rossetti, PA-C  Multiple Vitamin (MULTIVITAMIN) LIQD Take 15 mLs by mouth daily. 08/20/19   Amin, Loura Halt, MD  pantoprazole (PROTONIX) 40 MG tablet Take 1 tablet (40 mg total) by mouth 2 (two) times daily before a meal. 08/26/19   Angiulli, Mcarthur Rossetti, PA-C  QUEtiapine (SEROQUEL) 25 MG tablet Take 1 tablet (25 mg total) by mouth at bedtime. 08/26/19   Angiulli, Mcarthur Rossetti, PA-C  sennosides (SENOKOT) 8.8 MG/5ML syrup Place 5 mLs into feeding tube 2 (two) times daily as needed for  mild constipation. 08/19/19   Amin, Loura Halt, MD  simvastatin (ZOCOR) 20 MG tablet Take 1 tablet (20 mg total) by mouth daily. 08/26/19   Angiulli, Mcarthur Rossetti, PA-C    Scheduled Meds: . amLODipine  10 mg Oral Daily  . Chlorhexidine Gluconate Cloth  6 each Topical Daily  . enoxaparin (LOVENOX) injection  40 mg Subcutaneous Q24H  . feeding supplement (ENSURE ENLIVE)  237 mL Oral BID BM  . folic acid  1 mg Oral Daily  . lacosamide  200 mg Oral BID  . levETIRAcetam  1,500 mg Oral BID  . metoprolol tartrate  25 mg Oral BID  . multivitamin with minerals  1 tablet Oral Daily  . simvastatin  20 mg Oral q1800  . thiamine  100 mg Oral Daily   Or  . thiamine  100 mg Intravenous Daily   Continuous Infusions:  PRN Meds:.acetaminophen **OR** acetaminophen, labetalol, LORazepam, ondansetron **OR** ondansetron (ZOFRAN) IV     Results for orders placed or performed during the hospital encounter of 02/09/20 (from the past 48 hour(s))  Basic metabolic panel     Status: Abnormal   Collection Time: 02/14/20  3:05 AM  Result Value Ref Range   Sodium 130 (L) 135 - 145 mmol/L   Potassium 3.5 3.5 - 5.1 mmol/L   Chloride 99 98 - 111 mmol/L   CO2 20 (L) 22 - 32 mmol/L   Glucose, Bld 95 70 - 99 mg/dL    Comment: Glucose reference range applies only to samples taken after fasting for at least 8 hours.   BUN 13 6 - 20 mg/dL   Creatinine, Ser 0.34 0.44 - 1.00 mg/dL   Calcium 9.0 8.9 - 74.2 mg/dL   GFR calc non Af Amer >60 >60 mL/min   GFR calc Af Amer >60 >60 mL/min   Anion gap 11 5 - 15    Comment: Performed at [facility], [address], [place], [place] [address]  Magnesium     Status: None   Collection Time: 02/14/20  3:05 AM  Result Value Ref Range   Magnesium 1.9 1.7 - 2.4 mg/dL    Comment: Performed at [facility], [address], [place], [place] [address]  Basic metabolic panel     Status: Abnormal   Collection Time: 02/15/20  6:19 AM  Result Value Ref Range   Sodium 128 (L) 135 - 145  mmol/L   Potassium 3.7 3.5 - 5.1 mmol/L   Chloride 96 (L) 98 - 111 mmol/L   CO2 22 22 - 32 mmol/L   Glucose, Bld 99 70 - 99 mg/dL    Comment: Glucose reference range applies only to samples taken after fasting for at least 8 hours.   BUN 14 6 - 20 mg/dL   Creatinine, Ser 3.32 0.44 - 1.00 mg/dL   Calcium 9.0 8.9 - 95.1 mg/dL   GFR calc non Af Amer >60 >60  mL/min   GFR calc Af Amer >60 >60 mL/min   Anion gap 10 5 - 15    Comment: Performed at [facility], [address], [place], [place] [address]  Magnesium     Status: Abnormal   Collection Time: 02/15/20  6:19 AM  Result Value Ref Range   Magnesium 1.6 (L) 1.7 - 2.4 mg/dL    Comment: Performed at [facility], [address], [place], [place] [address]  CBC     Status: Abnormal   Collection Time: 02/15/20  6:19 AM  Result Value Ref Range   WBC 9.2 4.0 - 10.5 K/uL   RBC 4.59 3.87 - 5.11 MIL/uL   Hemoglobin 12.6 12.0 - 15.0 g/dL   HCT 15.4 36 - 46 %   MCV 85.2 80.0 - 100.0 fL   MCH 27.5 26.0 - 34.0 pg   MCHC 32.2 30.0 - 36.0 g/dL   RDW 00.8 (H) 67.6 - 19.5 %   Platelets 283 150 - 400 K/uL   nRBC 0.0 0.0 - 0.2 %    Comment: Performed at [facility], [address] [place], [place] [address]    Studies/Results:  BRAIN MRI 07-2019 FINDINGS: Brain: Scattered small foci of restricted diffusion are seen within the bilateral cerebral hemispheres, consistent with small acute infarcts, likely embolic.   No hemorrhage, hydrocephalus, extra-axial collection or mass lesion.   Scattered confluent foci of T2 hyperintensity are seen within the white matter of the cerebral hemispheres, within the thalami, pons and cerebellum, nonspecific remote lacunar infarcts are seen in the bilateral basal ganglia, bilateral centrum semiovale and corona radiata at the, right middle cerebellar peduncle and left cerebellar hemisphere. Findings are more pronounced than expected for age.   Prominence of the cerebral sulci and ventricular system  reflecting parenchymal volume loss.   Vascular: Normal flow voids. Single A2 from incidentally noted   Skull and upper cervical spine: Normal marrow signal.   Sinuses/Orbits: Mucosal thickening of the maxillary sinuses, sphenoid sinuses and ethmoid cells. The orbits are maintained.   Other: Bilateral mastoid effusions.   IMPRESSION: 1. Scattered small foci of restricted diffusion within the bilateral cerebral hemispheres, consistent with small acute infarcts, likely embolic. 2. Advanced white matter disease, more pronounced than expected for age. 3. Mild inflammatory paranasal sinus disease and bilateral mastoid Effusions.    HEAD CT 02-09-2020 FINDINGS: Brain: No acute hemorrhage. Generalized atrophy, mild but age advanced. Advanced periventricular white matter hypodensity consistent with chronic small vessel ischemia, particularly advanced for age. Lacunar infarct in the right basal ganglia. Remote but new from March 2021. Additional tiny bilateral basal gangliar lacunar infarcts are unchanged. No evidence of acute ischemia. No subdural or extra-axial collection.   Vascular: Atherosclerosis of skullbase vasculature without hyperdense vessel or abnormal calcification.   Skull: No fracture or focal lesion.   Sinuses/Orbits: Fluid level with frothy debris in the left side of sphenoid sinus. Remaining paranasal sinuses are clear. No mastoid effusion. Orbits are unremarkable.   Other: None.   IMPRESSION: 1. No acute intracranial abnormality. 2. Generalized atrophy and chronic small vessel ischemia similar to prior exam but advanced for age. Lacunar infarct in the right basal ganglia appears remote, but new from March 2021. 3. Fluid level with frothy debris in the left side of the sphenoid sinus, can be seen with acute sinusitis.     Both the head CT scan in the brain scan are reviewed. Brain MRI scan was done in March of this year. Both showed extensive confluent  leukoencephalopathy consistent with chronic microvascular changes. Remote infarct is noted right basal ganglia on CT not seen on previous imaging. There is also remote infarct left basal ganglia seen on the MRI from March.  This is a critical patient who is in status epilepticus requiring multiple discussion with the referring providers and staff in coordination of care. Critical care time 90 minutes.      Treshaun Carrico A. Cindy Pilgrim, M.D.  Diplomate, Biomedical engineer of Psychiatry and Neurology ( Neurology). 02/15/2020, 5:23 PM

## 2020-02-15 NOTE — Plan of Care (Signed)
  Problem: Acute Rehab PT Goals(only PT should resolve) Goal: Pt Will Go Supine/Side To Sit Outcome: Progressing Flowsheets (Taken 02/15/2020 1042) Pt will go Supine/Side to Sit:  with minimal assist  with moderate assist Goal: Patient Will Transfer Sit To/From Stand Outcome: Progressing Flowsheets (Taken 02/15/2020 1042) Patient will transfer sit to/from stand:  with minimal assist  with moderate assist Goal: Pt Will Transfer Bed To Chair/Chair To Bed Outcome: Progressing Flowsheets (Taken 02/15/2020 1042) Pt will Transfer Bed to Chair/Chair to Bed: with mod assist Goal: Pt Will Ambulate Outcome: Progressing Flowsheets (Taken 02/15/2020 1042) Pt will Ambulate:  15 feet  with minimal assist  with moderate assist  with rolling walker   10:43 AM, 02/15/20 Ocie Bob, MPT Physical Therapist with [facility] [phone] office [phone] mobile phone

## 2020-02-15 NOTE — Evaluation (Signed)
Physical Therapy Evaluation Patient Details Name: Cindy [name] MRN: [id number] DOB: Jul 11, 1960 Today's Date: 02/15/2020   History of Present Illness  Cindy [name] is a 59 y.o. female with medical history significant of LFTs abnormality history, history of multiple CVAs, hyperlipidemia, hypertension, polysubstance abuse including opioids and cocaine, seizure disorder who is brought via EMS to the emergency department after the patient started having seizures.  EMS was initially called because of family with patient being lethargic presents 24 hours or so.  When EMS arrived the patient was actively seizing and she received 5 mg of IV midazolam.  He was febrile in the emergency department without any obvious source.  She is still confused, does not follow simple commands and is unable to provide further information.    Clinical Impression  Patient presents very lethargic possibly due to medication, able to follow some commands 30-40% of time, able to sit up at bedside with frequent leaning forward and to the left, unable to extend trunk due to weakness and put back to bed after therapy with her spouse present in room.  Patient will benefit from continued physical therapy in hospital and recommended venue below to increase strength, balance, endurance for safe ADLs and gait.    Follow Up Recommendations SNF    Equipment Recommendations  None recommended by PT    Recommendations for Other Services       Precautions / Restrictions Precautions Precautions: Fall Restrictions Weight Bearing Restrictions: No      Mobility  Bed Mobility Overal bed mobility: Needs Assistance Bed Mobility: Supine to Sit;Sit to Supine     Supine to sit: Max assist Sit to supine: Max assist   General bed mobility comments: unable to lift head against gravity while seated due to weakness  Transfers                    Ambulation/Gait                Stairs            Wheelchair  Mobility    Modified Rankin (Stroke Patients Only)       Balance Overall balance assessment: Needs assistance Sitting-balance support: Feet supported;Bilateral upper extremity supported Sitting balance-Leahy Scale: Poor Sitting balance - Comments: seated at EOB Postural control: Left lateral lean                                   Pertinent Vitals/Pain Pain Assessment: Faces Faces Pain Scale: No hurt    Home Living Family/patient expects to be discharged to:: Private residence   Available Help at Discharge: Family;Available 24 hours/day Type of Home: House Home Access: Stairs to enter Entrance Stairs-Rails: Right;Left (to wide to reach both) Entrance Stairs-Number of Steps: 3 Home Layout: One level Home Equipment: Walker - 2 wheels;Wheelchair - manual Additional Comments: information per patient's spouse    Prior Function Level of Independence: Independent with assistive device(s);Needs assistance   Gait / Transfers Assistance Needed: household ambulator using RW  ADL's / Homemaking Assistance Needed: assisted by family        Hand Dominance   Dominant Hand: Right    Extremity/Trunk Assessment   Upper Extremity Assessment Upper Extremity Assessment: Generalized weakness    Lower Extremity Assessment Lower Extremity Assessment: Generalized weakness    Cervical / Trunk Assessment Cervical / Trunk Assessment: Kyphotic  Communication      Cognition Arousal/Alertness: Lethargic;Suspect  due to medications Behavior During Therapy: Flat affect Overall Cognitive Status: Difficult to assess                                 General Comments: Patient very lethargic      General Comments      Exercises     Assessment/Plan    PT Assessment Patient needs continued PT services  PT Problem List Decreased strength;Decreased activity tolerance;Decreased balance;Decreased mobility       PT Treatment Interventions Balance  training;Gait training;Stair training;Functional mobility training;Therapeutic activities;Therapeutic exercise;Patient/family education    PT Goals (Current goals can be found in the Care Plan section)  Acute Rehab PT Goals Patient Stated Goal: not stated, patient's spouse wants patient to be able to walk before returning home PT Goal Formulation: With patient/family Time For Goal Achievement: 02/29/20 Potential to Achieve Goals: Good    Frequency Min 3X/week   Barriers to discharge        Co-evaluation               AM-PAC PT "6 Clicks" Mobility  Outcome Measure Help needed turning from your back to your side while in a flat bed without using bedrails?: A Lot Help needed moving from lying on your back to sitting on the side of a flat bed without using bedrails?: A Lot Help needed moving to and from a bed to a chair (including a wheelchair)?: Total Help needed standing up from a chair using your arms (e.g., wheelchair or bedside chair)?: Total Help needed to walk in hospital room?: Total Help needed climbing 3-5 steps with a railing? : Total 6 Click Score: 8    End of Session   Activity Tolerance: Patient tolerated treatment well;Patient limited by lethargy;Patient limited by fatigue Patient left: in bed;with call bell/phone within reach;with bed alarm set;with family/visitor present Nurse Communication: Mobility status PT Visit Diagnosis: Unsteadiness on feet (R26.81);Other abnormalities of gait and mobility (R26.89);Muscle weakness (generalized) (M62.81)    Time: 3009-2330 PT Time Calculation (min) (ACUTE ONLY): 18 min   Charges:   PT Evaluation $PT Eval Low Complexity: 1 Low PT Treatments $Therapeutic Activity: 8-22 mins        10:42 AM, 02/15/20 Ocie Bob, MPT Physical Therapist with [facility] 336 [phone] office [phone] mobile phone

## 2020-02-15 NOTE — Plan of Care (Signed)

## 2020-02-16 LAB — CREATININE, SERUM
Creatinine, Ser: 0.83 mg/dL (ref 0.44–1.00)
GFR calc Af Amer: >60 mL/min (ref >60)
GFR calc non Af Amer: >60 mL/min (ref >60)

## 2020-02-16 LAB — MAGNESIUM: Magnesium: 2.5 mg/dL — ABNORMAL HIGH (ref 1.7–2.4)

## 2020-02-16 MED ORDER — HALOPERIDOL LACTATE 5 MG/ML IJ SOLN
1.0000 mg | Freq: Four times a day (QID) | INTRAMUSCULAR | Status: DC | PRN
  Administered 2020-02-16: 1 mg via INTRAVENOUS
  Filled 2020-02-16: qty 1

## 2020-02-16 MED ORDER — SODIUM CHLORIDE 0.9 % IV SOLN
200.0000 mg | Freq: Once | INTRAVENOUS | Status: AC
  Administered 2020-02-17: 200 mg via INTRAVENOUS
  Filled 2020-02-16: qty 20

## 2020-02-16 MED ORDER — LEVETIRACETAM IN NACL 1500 MG/100ML IV SOLN
1500.0000 mg | Freq: Once | INTRAVENOUS | Status: AC
  Administered 2020-02-17: 1500 mg via INTRAVENOUS
  Filled 2020-02-16: qty 100

## 2020-02-16 NOTE — Plan of Care (Signed)
  Problem: Education: Goal: Knowledge of General Education information will improve Description: Including pain rating scale, medication(s)/side effects and non-pharmacologic comfort measures Outcome: Progressing   Problem: Clinical Measurements: Goal: Will remain free from infection Outcome: Progressing   Problem: Clinical Measurements: Goal: Respiratory complications will improve Outcome: Progressing   Problem: Activity: Goal: Risk for activity intolerance will decrease Outcome: Progressing   Problem: Coping: Goal: Level of anxiety will decrease Outcome: Progressing   Problem: Elimination: Goal: Will not experience complications related to bowel motility Outcome: Progressing   Problem: Pain Managment: Goal: General experience of comfort will improve Outcome: Progressing   Problem: Safety: Goal: Ability to remain free from injury will improve Outcome: Progressing   Problem: Skin Integrity: Goal: Risk for impaired skin integrity will decrease Outcome: Progressing

## 2020-02-16 NOTE — Plan of Care (Signed)

## 2020-02-16 NOTE — TOC Progression Note (Signed)
Transition of Care [facility]) - Progression Note    Patient Details  Name: Cindy [name] MRN: [id number] Date of Birth: 09-29-1960  Transition of [facility] CM/SW Contact  Leitha Bleak, RN Phone Number: 02/16/2020, 3:55 PM  Clinical Narrative:   [employer] is working on [employer], needs signatures. Just initial review, it is promising that patient will get disability and medicaid. Patient has had Medicaid in the past. Patient needs SNF. TOC spoke to husband he can try to get his sister to help, but hopeful for SNF.  TOC will send out FL2 with LOG offer to SNF's to see if we can get a bed offer for patient.     Expected Discharge Plan: Skilled Nursing Facility Barriers to Discharge: Continued Medical Work up  Expected Discharge Plan and Services Expected Discharge Plan: Skilled Nursing Facility       Living arrangements for the past 2 months: Single Family Home

## 2020-02-16 NOTE — [place] FL2 (Signed)
[place] MEDICAID FL2 LEVEL OF CARE SCREENING TOOL     IDENTIFICATION  Patient Name: Cindy [name] Birthdate: August 25, 1960 Sex: female Admission Date (Current Location): 02/09/2020  [facility] and [place] Number:  [employer] and Address:  [facility],  [address], [place] [address]      Provider Number: [id number]  Attending Physician Name and Address:  Cleora Fleet, MD  Relative Name and Phone Number:  Lydiann Bonifas - husband [phone]    Current Level of Care: Hospital Recommended Level of Care: Skilled Nursing Facility Prior Approval Number:    Date Approved/Denied:   PASRR Number: [id number]  Discharge Plan: SNF    Current Diagnoses: Patient Active Problem List   Diagnosis Date Noted   Seizure (HCC) 02/10/2020   Seizures (HCC) 02/09/2020   Hyponatremia 02/09/2020   SIRS (systemic inflammatory response syndrome) (HCC) 02/09/2020   Cocaine abuse (HCC) 02/09/2020   Ataxia due to recent stroke 08/19/2019   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   Cerebral embolism with cerebral infarction 08/13/2019   Status epilepticus (HCC) 08/05/2019   Acute encephalopathy 08/05/2019   TRANSAMINASES, SERUM, ELEVATED 10/23/2009   HYPERCHOLESTEROLEMIA 07/03/2008   ALCOHOL USE 07/03/2008   Essential hypertension 07/03/2008   STROKE 07/03/2008   DYSPHAGIA UNSPECIFIED 07/03/2008   Glucose intolerance 07/03/2008   Pain in joint, shoulder region 06/22/2008   SPONDYLOSIS 06/22/2008   CERVICALGIA 06/22/2008   SPINAL STENOSIS 06/22/2008   CERVICAL SPASM 06/22/2008    Orientation RESPIRATION BLADDER Height & Weight     Self  O2 (2L) External catheter Weight: 61.2 kg Height:  5\' 7"  (170.2 cm)  BEHAVIORAL SYMPTOMS/MOOD NEUROLOGICAL BOWEL NUTRITION STATUS      Continent Diet (See DC summary)  AMBULATORY STATUS COMMUNICATION OF NEEDS Skin   Extensive Assist Verbally Bruising (generalized)                        Personal Care Assistance Level of Assistance  Bathing, Feeding, Dressing Bathing Assistance: Maximum assistance Feeding assistance: Limited assistance Dressing Assistance: Maximum assistance     Functional Limitations Info  Sight, Speech, Hearing Sight Info: Adequate Hearing Info: Adequate Speech Info: Adequate    SPECIAL CARE FACTORS FREQUENCY  PT (By licensed PT)     PT Frequency: 5 times a week              Contractures Contractures Info: Not present    Additional Factors Info  Code Status, Allergies Code Status Info: Full Allergies Info: NKDA           Current Medications (02/16/2020):  This is the current hospital active medication list Current Facility-Administered Medications  Medication Dose Route Frequency Provider Last Rate Last Admin   acetaminophen (TYLENOL) tablet 650 mg  650 mg Oral Q6H PRN 02/18/2020, MD   650 mg at 02/15/20 2108   Or   acetaminophen (TYLENOL) suppository 650 mg  650 mg Rectal Q6H PRN 2109, MD       amLODipine (NORVASC) tablet 10 mg  10 mg Oral Daily Bobette Mo, Pratik D, DO   10 mg at 02/16/20 02/18/20   Chlorhexidine Gluconate Cloth 2 % PADS 6 each  6 each Topical Daily 7939 D, DO   6 each at 02/16/20 0809   enoxaparin (LOVENOX) injection 40 mg  40 mg Subcutaneous Q24H 02/18/20, MD   40 mg at 02/16/20 0809   feeding supplement (ENSURE ENLIVE) (ENSURE ENLIVE) liquid 237 mL  237 mL Oral BID BM Maurilio Lovely D, DO   237 mL at 02/16/20 0810   folic acid (FOLVITE) tablet 1 mg  1 mg Oral Daily Sherryll Burger, Pratik D, DO   1 mg at 02/16/20 0809   haloperidol lactate (HALDOL) injection 1 mg  1 mg Intravenous Q6H PRN Johnson, Clanford L, MD       labetalol (NORMODYNE) injection 10 mg  10 mg Intravenous Q2H PRN Sherryll Burger, Pratik D, DO   10 mg at 02/10/20 1843   lacosamide (VIMPAT) tablet 200 mg  200 mg Oral BID Maurilio Lovely D, DO   200 mg at 02/16/20 0805   levETIRAcetam (KEPPRA) tablet 1,500 mg  1,500 mg Oral  BID Maurilio Lovely D, DO   1,500 mg at 02/16/20 8119   LORazepam (ATIVAN) injection 1 mg  1 mg Intravenous Q4H PRN Maurilio Lovely D, DO   1 mg at 02/16/20 1478   metoprolol tartrate (LOPRESSOR) tablet 25 mg  25 mg Oral BID Laural Benes, Clanford L, MD   25 mg at 02/16/20 2956   multivitamin with minerals tablet 1 tablet  1 tablet Oral Daily Maurilio Lovely D, DO   1 tablet at 02/16/20 0808   ondansetron (ZOFRAN) tablet 4 mg  4 mg Oral Q6H PRN Bobette Mo, MD       Or   ondansetron [facility]) injection 4 mg  4 mg Intravenous Q6H PRN Bobette Mo, MD       simvastatin (ZOCOR) tablet 20 mg  20 mg Oral q1800 Maurilio Lovely D, DO   20 mg at 02/15/20 1730   thiamine tablet 100 mg  100 mg Oral Daily Maurilio Lovely D, DO   100 mg at 02/16/20 2130   Or   thiamine (B-1) injection 100 mg  100 mg Intravenous Daily Maurilio Lovely D, DO         Discharge Medications: Please see discharge summary for a list of discharge medications.  Relevant Imaging Results:  Relevant Lab Results:   Additional Information SS# [id number]  Leitha Bleak, RN

## 2020-02-16 NOTE — Progress Notes (Signed)
PROGRESS NOTE    Cindy [name]  FOY:[id number] DOB: Oct 13, 1960 DOA: 02/09/2020 PCP: Avon Gully, MD   Brief Narrative:  Per HPI: Cindy [name] a 59 y.o.femalewith medical history significant ofLFTs abnormality history, history of multiple CVAs, hyperlipidemia, hypertension, polysubstance abuse including opioids and cocaine, seizure disorder who is brought via EMS to the emergency department after the patient started having seizures. EMS was initiallycalledbecause of family with patient being lethargic presents 24 hours or so. When EMS arrived the patient was actively seizing and she received 5 mg of IV midazolam. He was febrile in the emergency department without any obvious source. She is still confused, does not follow simple commands and is unable to provide further information.  9/24:Patient was admitted with what appears to be a breakthrough seizure in the setting of positive cocaine use. She has received Ativan as well as 1 g of IV Keppra and will be started on Keppra 500 mg IV twice daily. EEG ordered with neurology consultation pending. She still appears quite somnolent. She was initially started on some antibiotics empirically, but procalcitonin is low and no other signs of infection or noted,therefore these will be discontinued. A.m. labs ordered.  9/25:Patient noted to be more awake and responsive this morning, but states "yes" to every question and does not answer questions appropriately. She is improved from yesterday and has been started on Vimpat with repeat loading dose of Keppra yesterday by neurology. Continue to monitor through today and consider EEG if she does not have adequate neurological improvement by 9/26.  9/26:Per nursing staff, patient has been more alert and talkative overall, but she did receive Ativan earlier this morning and is currently quite somnolent. 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  9/27: Patient is more awake and alert and responsive this morning although she remains mostly somnolent.  Repeat EEG pending for reevaluation of seizure activity.  Continue current treatments.  9/28: Patient remains alert and responsive this morning with no acute overnight events noted.  Repeat EEG with no seizure activity noted.  Plans to switch to oral antiepileptics per neurology which has been now ordered.  PT evaluation ordered as well, with likely need for placement to a facility, but insurance remains an issue per CSW.  9/29:  Pt slightly more responsive.  No further seizure activities.  Tolerating oral meds.  Unable to discharge due to [facility] working out safe discharge plan as patient has no insurance, not qualifying for medicaid at this time, TOC is telling me that patient will have to discharge home with home health services.   9/30: no further seizure activity.  Pt awaiting for SNF bed.  [facility] offered LOG pending approval of medicaid benefits.   Assessment & Plan:   Principal Problem:   Seizures (HCC) Active Problems:   HYPERCHOLESTEROLEMIA   Essential hypertension   Glucose intolerance   Acute encephalopathy   Hyponatremia   SIRS (systemic inflammatory response syndrome) (HCC)   Cocaine abuse (HCC)   Seizure (HCC)   Acute encephalopathy secondary tostatus epilepticus-improved -Patient currently more awake and alert -Continue now on Keppra 1500 mg twice daily and Vimpat 200 mg twice daily oral per neurology -EEGwith significant findings reviewed by neurology, repeat 9/27 with no seizure activity noted -CT head with no acute findings -Appreciateongoingneurology evaluation -PT recommending SNF and awaiting placement and bed offer.    Polysubstance abuse -Noted to be positive for benzodiazepines and cocaine -Likely related to breakthrough seizureversus alcohol withdrawal -  Counseling on substance abuse and will need resources in outpatient  setting -TOC consulted.   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 -TSHwithin normal limits -diuretics started 9/30  Dyslipidemia -Plan to resume statin today  Essential hypertension-better controlled -Resume home amlodipine, added metoprolol 25 mg BID -We will order IV labetalol as needed for significant elevations  DVT prophylaxis:Lovenox Code Status:Full code Family Communication:Husband at bedside 9/26,9/30 Disposition Plan: Status is: Inpatient  Remains inpatient appropriate because:Unsafe d/c plan, IV treatments appropriate due to intensity of illness or inability to take PO and Inpatient level of care appropriate due to severity of illness  Dispo: The patient is from:Home Anticipated d/c is GX:QJJHER home, although SNF would be appropriate (insurance issue) Anticipated d/c date is:1 days Patient currently is medically stable to d/c.  Pt needs SNF placement, awaiting for SNF bed offer, [facility] working with [facility] and will provide LOG pending Medicaid benefits   Consultants:  Neurology  Procedures:  See below, EEG   Antimicrobials:   None   Subjective: Patient reports no seizure activity. Pt remains intermittently very confused.    Objective: Vitals:   02/15/20 2114 02/16/20 0000 02/16/20 0600 02/16/20 1452  BP: (!) 151/93 (!) 162/90 111/79 (!) 145/82  Pulse: (!) 101 100 74 88  Resp: 18 18 18    Temp:  97.7 F (36.5 C) 98.5 F (36.9 C) 97.9 F (36.6 C)  TempSrc:  Axillary Oral Oral  SpO2: 98% 96% 100% 100%  Weight:      Height:        Intake/Output Summary (Last 24 hours) at 02/16/2020 1530 Last data filed at 02/15/2020 2110 Gross per 24 hour  Intake 200 ml  Output --  Net 200 ml   Filed Weights    02/09/20 1959  Weight: 61.2 kg    Examination:  General exam: Appears calm and comfortable  Respiratory system: Clear to auscultation. Respiratory effort normal. Cardiovascular system: normal S1 & S2 heard.  Gastrointestinal system: Abdomen is nondistended, soft and nontender. Central nervous system: Alert and awake Extremities: No edema Skin: No rashes, lesions or ulcers Psychiatry: Judgement and insight appear normal. Mood & affect appropriate.   Data Reviewed: I have personally reviewed following labs and imaging studies  CBC: Recent Labs  Lab 02/09/20 2057 02/09/20 2057 02/10/20 0823 02/11/20 0655 02/12/20 0744 02/13/20 0625 02/15/20 0619  WBC 14.5*   < > 8.8 8.3 7.1 7.8 9.2  NEUTROABS 13.4*  --  6.0 6.5 5.7 5.8  --   HGB 14.2   < > 12.2 11.4* 12.0 12.5 12.6  HCT 43.9   < > 38.6 35.3* 37.2 39.1 39.1  MCV 85.7   < > 86.2 85.7 84.4 86.3 85.2  PLT 311   < > 246 250 267 262 283   < > = values in this interval not displayed.   Basic Metabolic Panel: Recent Labs  Lab 02/11/20 0655 02/11/20 0655 02/12/20 0744 02/13/20 0625 02/14/20 0305 02/15/20 0619 02/16/20 0319  NA 131*  --  129* 131* 130* 128*  --   K 3.2*  --  3.0* 3.8 3.5 3.7  --   CL 100  --  100 100 99 96*  --   CO2 20*  --  21* 20* 20* 22  --   GLUCOSE 101*  --  112* 79 95 99  --  BUN 10  --  --   CREATININE 0.88   < > 0.65 0.87 0.88 0.77 0.83  CALCIUM 8.4*  --  8.5* 8.8* 9.0 9.0  --   MG 1.5*   < > 1.7 1.6* 1.9 1.6* 2.5*   < > = values in this interval not displayed.   GFR: Estimated Creatinine Clearance: 69.6 mL/min (by C-G formula based on SCr of 0.83 mg/dL). Liver Function Tests: Recent Labs  Lab 02/09/20 2057 02/10/20 0450 02/11/20 0655  AST ALT ALKPHOS 94 71 56  BILITOT 0.8 1.3* 1.0  PROT 8.7* 6.9 5.8*  ALBUMIN 3.9 3.0* 2.6*   No results for input(s): LIPASE, AMYLASE in the last 168 hours. No results for input(s): AMMONIA in the last 168  hours. Coagulation Profile: Recent Labs  Lab 02/09/20 2057  INR 1.0   Cardiac Enzymes: No results for input(s): CKTOTAL, CKMB, CKMBINDEX, TROPONINI in the last 168 hours. BNP (last 3 results) No results for input(s): PROBNP in the last 8760 hours. HbA1C: No results for input(s): HGBA1C in the last 72 hours. CBG: No results for input(s): GLUCAP in the last 168 hours. Lipid Profile: No results for input(s): CHOL, HDL, LDLCALC, TRIG, CHOLHDL, LDLDIRECT in the last 72 hours. Thyroid Function Tests: No results for input(s): TSH, T4TOTAL, FREET4, T3FREE, THYROIDAB in the last 72 hours. Anemia Panel: No results for input(s): VITAMINB12, FOLATE, FERRITIN, TIBC, IRON, RETICCTPCT in the last 72 hours. Sepsis Labs: Recent Labs  Lab 02/09/20 2057 02/10/20 0105 02/10/20 0450  PROCALCITON  --   --  <0.10  LATICACIDVEN 2.2* 2.0*  --     Recent Results (from the past 240 hour(s))  Respiratory Panel by RT PCR (Flu A&B, Covid) -     Status: None   Collection Time: 02/09/20  8:21 PM   Specimen: Nasopharyngeal  Result Value Ref Range Status   SARS Coronavirus 2 by RT PCR NEGATIVE NEGATIVE Final    Comment: (NOTE) SARS-CoV-2 target nucleic acids are NOT DETECTED.  The SARS-CoV-2 RNA is generally detectable in upper respiratoy specimens during the acute phase of infection. The lowest concentration of SARS-CoV-2 viral copies this assay can detect is 131 copies/mL. A negative result does not preclude SARS-Cov-2 infection and should not be used as the sole basis for treatment or other patient management decisions. A negative result may occur with  improper specimen collection/handling, submission of specimen other than nasopharyngeal swab, presence of viral mutation(s) within the areas targeted by this assay, and inadequate number of viral copies (<131 copies/mL). A negative result must be combined with clinical observations, patient history, and epidemiological information. The expected  result is Negative.  Fact Sheet for Patients:  https://www.moore.com/  Fact Sheet for Healthcare Providers:  https://www.young.biz/  This test is no t yet approved or cleared by the [place] FDA and  has been authorized for detection and/or diagnosis of SARS-CoV-2 by FDA under an Emergency Use Authorization (EUA). This EUA will remain  in effect (meaning this test can be used) for the duration of the COVID-19 declaration under Section 564(b)(1) of the Act, 21 U.S.C. section 360bbb-3(b)(1), unless the authorization is terminated or revoked sooner.     Influenza A by PCR NEGATIVE NEGATIVE Final   Influenza B by PCR NEGATIVE NEGATIVE Final    Comment: (NOTE) The Xpert Xpress SARS-CoV-2/FLU/RSV assay is intended as an aid in  the diagnosis of influenza from Nasopharyngeal swab specimens and  should  not be used as a sole basis for treatment. Nasal washings and  aspirates are unacceptable for Xpert Xpress SARS-CoV-2/FLU/RSV  testing.  Fact Sheet for Patients: https://www.moore.com/https://www.fda.gov/media/142436/download  Fact Sheet for Healthcare Providers: https://www.young.biz/https://www.fda.gov/media/142435/download  This test is not yet approved or cleared by the [place] FDA and  has been authorized for detection and/or diagnosis of SARS-CoV-2 by  FDA under an Emergency Use Authorization (EUA). This EUA will remain  in effect (meaning this test can be used) for the duration of the  Covid-19 declaration under Section 564(b)(1) of the Act, 21  U.S.C. section 360bbb-3(b)(1), unless the authorization is  terminated or revoked. Performed at [facility], [address] [place], [place] [address]   Blood Culture (routine x 2)     Status: None   Collection Time: 02/09/20  8:52 PM   Specimen: BLOOD  Result Value Ref Range Status   Specimen Description BLOOD BLOOD RIGHT ARM  Final   Special Requests   Final    BOTTLES DRAWN AEROBIC AND ANAEROBIC Blood Culture adequate  volume   Culture   Final    NO GROWTH 6 DAYS Performed at [facility], [address] [place], [place] [address]    Report Status 02/15/2020 FINAL  Final  Blood Culture (routine x 2)     Status: None   Collection Time: 02/09/20  8:57 PM   Specimen: BLOOD RIGHT HAND  Result Value Ref Range Status   Specimen Description BLOOD RIGHT HAND  Final   Special Requests   Final    BOTTLES DRAWN AEROBIC AND ANAEROBIC Blood Culture adequate volume   Culture   Final    NO GROWTH 6 DAYS Performed at [facility], [address] [place], [place] [address]    Report Status 02/15/2020 FINAL  Final  Urine culture     Status: None   Collection Time: 02/09/20  9:11 PM   Specimen: In/Out Cath Urine  Result Value Ref Range Status   Specimen Description   Final    IN/OUT CATH URINE Performed at [facility], [address] [place], [place] [address]    Special Requests   Final    NONE Performed at [facility], [address] [place], [place] [address]    Culture   Final    NO GROWTH Performed at [facility], [address] [place], [place] [address]    Report Status 02/11/2020 FINAL  Final    Radiology Studies: No results found. Scheduled Meds: . amLODipine  10 mg Oral Daily  . Chlorhexidine Gluconate Cloth  6 each Topical Daily  . enoxaparin (LOVENOX) injection  40 mg Subcutaneous Q24H  . feeding supplement (ENSURE ENLIVE)  237 mL Oral BID BM  . folic acid  1 mg Oral Daily  . lacosamide  200 mg Oral BID  . levETIRAcetam  1,500 mg Oral BID  . metoprolol tartrate  25 mg Oral BID  . multivitamin with minerals  1 tablet Oral Daily  . simvastatin  20 mg Oral q1800  . thiamine  100 mg Oral Daily   Or  . thiamine  100 mg Intravenous Daily     LOS: 6 days   Time spent: 30 minutes  Brocha Gilliam Laural BenesJohnson, MD How to contact the [facility] Attending or Consulting provider 7A - 7P or covering provider during after hours 7P -7A, for this patient?  1. Check the care team in [facility] and look for a)  attending/consulting [facility] provider listed and b) the [facility] team listed  2. Log into [email or web address] and use [facility] universal password to access. If you do not have the password, please contact the hospital operator. 3. Locate the [facility] provider you are looking for under [facility] and page to a number that you can be directly reached. 4. If you still have difficulty reaching the provider, please page the [facility] (Director on Call) for the Hospitalists listed on amion for assistance.  [facility]  If 7PM-7AM, please contact night-coverage [email or web address] 02/16/2020, 3:30 PM

## 2020-02-17 MED ORDER — SIMVASTATIN 20 MG PO TABS: 20.0000 mg | ORAL_TABLET | Freq: Every day | ORAL | 1 refills | Status: DC

## 2020-02-17 MED ORDER — LACOSAMIDE 200 MG/20ML IV SOLN
INTRAVENOUS | Status: AC
  Filled 2020-02-17: qty 20

## 2020-02-17 MED ORDER — LEVETIRACETAM 750 MG PO TABS: 1500.0000 mg | ORAL_TABLET | Freq: Two times a day (BID) | ORAL | 1 refills | Status: DC

## 2020-02-17 MED ORDER — THIAMINE HCL 100 MG PO TABS
100.0000 mg | ORAL_TABLET | Freq: Every day | ORAL | 1 refills | Status: DC
Start: 2020-02-18 — End: 2020-10-27

## 2020-02-17 MED ORDER — ADULT MULTIVITAMIN W/MINERALS CH: 1.0000 | ORAL_TABLET | Freq: Every day | ORAL | Status: DC

## 2020-02-17 MED ORDER — METOPROLOL TARTRATE 25 MG PO TABS
25.0000 mg | ORAL_TABLET | Freq: Two times a day (BID) | ORAL | 1 refills | Status: DC
Start: 2020-02-17 — End: 2020-09-21

## 2020-02-17 MED ORDER — FOLIC ACID 1 MG PO TABS
1.0000 mg | ORAL_TABLET | Freq: Every day | ORAL | 1 refills | Status: DC
Start: 2020-02-18 — End: 2020-09-21

## 2020-02-17 MED ORDER — AMLODIPINE BESYLATE 10 MG PO TABS: ORAL_TABLET | ORAL | 1 refills | Status: DC

## 2020-02-17 MED ORDER — LACOSAMIDE 200 MG PO TABS
200.0000 mg | ORAL_TABLET | Freq: Two times a day (BID) | ORAL | 1 refills | Status: DC
Start: 2020-02-17 — End: 2020-09-21

## 2020-02-17 NOTE — TOC Transition Note (Addendum)
Transition of Care [facility]) - CM/SW Discharge Note   Patient Details  Name: MYRTICE LOWDERMILK MRN: [id number] Date of Birth: 1960-12-27  Transition of Care [facility]) CM/SW Contact:  Leitha Bleak, RN Phone Number: 02/17/2020, 12:38 PM   Clinical Narrative:   Patient is medicaid potential. Application has been started. TOC sent and called multiple facilities and offering LOG. Patient is positive for cocaine and did not get any bed offers. TOC spoke with husband, he state she has three people that has agreed to help him care for her at home. TOC call [facility] for charity [facility], patient denied due to cocaine use. TOC referring to [facility]. Husband will come at 3pm to take patient home, RN anticipated patient will need EMS to transport. TOC will follow, Good RX card will be given to husband for medication, Keppra is $9.00. updated [facility], they will reach out to husband to complete medicaid applications.   Final next level of care: Other (comment) ([place] integrated Program) Barriers to Discharge: Barriers Resolved   Patient Goals and CMS Choice Patient states their goals for this hospitalization and ongoing recovery are:: to go to SNF. CMS Medicare.gov Compare Post Acute Care list provided to:: Patient Represenative (must comment) Choice offered to / list presented to : Spouse  Discharge Placement                Patient to be transferred to facility by: EMS to home Name of family member notified: [place] Patient and family notified of of transfer: 02/17/20  Discharge Plan and Services     Home - with [facility]

## 2020-02-17 NOTE — Discharge Instructions (Signed)
Seizure, Adult A seizure is a sudden burst of abnormal electrical activity in the brain. Seizures usually last from 30 seconds to 2 minutes. They can cause many different symptoms. Usually, seizures are not harmful unless they last a long time. What are the causes? Common causes of this condition include:  Fever or infection.  Conditions that affect the brain, such as: ? A brain abnormality that you were born with. ? A brain or head injury. ? Bleeding in the brain. ? A tumor. ? Stroke. ? Brain disorders such as autism or cerebral palsy.  Low blood sugar.  Conditions that are passed from parent to child (are inherited).  Problems with substances, such as: ? Having a reaction to a drug or a medicine. ? Suddenly stopping the use of a substance (withdrawal). In some cases, the cause may not be known. A person who has repeated seizures over time without a clear cause has a condition called epilepsy. What increases the risk? You are more likely to get this condition if you have:  A family history of epilepsy.  Had a seizure in the past.  A brain disorder.  A history of head injury, lack of oxygen at birth, or strokes. What are the signs or symptoms? There are many types of seizures. The symptoms vary depending on the type of seizure you have. Examples of symptoms during a seizure include:  Shaking (convulsions).  Stiffness in the body.  Passing out (losing consciousness).  Head nodding.  Staring.  Not responding to sound or touch.  Loss of bladder control and bowel control. Some people have symptoms right before and right after a seizure happens. Symptoms before a seizure may include:  Fear.  Worry (anxiety).  Feeling like you may vomit (nauseous).  Feeling like the room is spinning (vertigo).  Feeling like you saw or heard something before (dj vu).  Odd tastes or smells.  Changes in how you see. You may see flashing lights or spots. Symptoms after a  seizure happens can include:  Confusion.  Sleepiness.  Headache.  Weakness on one side of the body. How is this treated? Most seizures will stop on their own in under 5 minutes. In these cases, no treatment is needed. Seizures that last longer than 5 minutes will usually need treatment. Treatment can include:  Medicines given through an IV tube.  Avoiding things that are known to cause your seizures. These can include medicines that you take for another condition.  Medicines to treat epilepsy.  Surgery to stop the seizures. This may be needed if medicines do not help. Follow these instructions at home: Medicines  Take over-the-counter and prescription medicines only as told by your doctor.  Do not eat or drink anything that may keep your medicine from working, such as alcohol. Activity  Do not do any activities that would be dangerous if you had another seizure, like driving or swimming. Wait until your doctor says it is safe for you to do them.  If you live in the [place] ask your local DMV (department of motor vehicles) when you can drive.  Get plenty of rest. Teaching others Teach friends and family what to do when you have a seizure. They should:  Lay you on the ground.  Protect your head and body.  Loosen any tight clothing around your neck.  Turn you on your side.  Not hold you down.  Not put anything into your mouth.  Know whether or not you need emergency care.  Stay   with you until you are better.  General instructions  Contact your doctor each time you have a seizure.  Avoid anything that gives you seizures.  Keep a seizure diary. Write down: ? What you think caused each seizure. ? What you remember about each seizure.  Keep all follow-up visits as told by your doctor. This is important. Contact a doctor if:  You have another seizure.  You have seizures more often.  There is any change in what happens during your seizures.  You keep having  seizures with treatment.  You have symptoms of being sick or having an infection. Get help right away if:  You have a seizure that: ? Lasts longer than 5 minutes. ? Is different than seizures you had before. ? Makes it harder to breathe. ? Happens after you hurt your head.  You have any of these symptoms after a seizure: ? Not being able to speak. ? Not being able to use a part of your body. ? Confusion. ? A bad headache.  You have two or more seizures in a row.  You do not wake up right after a seizure.  You get hurt during a seizure. These symptoms may be an emergency. Do not wait to see if the symptoms will go away. Get medical help right away. Call your local emergency services (911 in the [place] Do not drive yourself to the hospital. Summary  Seizures usually last from 30 seconds to 2 minutes. Usually, they are not harmful unless they last a long time.  Do not eat or drink anything that may keep your medicine from working, such as alcohol.  Teach friends and family what to do when you have a seizure.  Contact your doctor each time you have a seizure. This information is not intended to replace advice given to you by your health care provider. Make sure you discuss any questions you have with your health care provider. Document Revised: 07/23/2018 Document Reviewed: 07/23/2018 [employer] Patient Education  2020 [employer]   IMPORTANT INFORMATION: PAY CLOSE ATTENTION   PHYSICIAN DISCHARGE INSTRUCTIONS  Follow with Primary care provider  Avon Gully, MD  and other consultants as instructed by your Hospitalist Physician  SEEK MEDICAL CARE OR RETURN TO EMERGENCY ROOM IF SYMPTOMS COME BACK, WORSEN OR NEW PROBLEM DEVELOPS   Please note: You were cared for by a hospitalist during your hospital stay. Every effort will be made to forward records to your primary care provider.  You can request that your primary care provider send for your hospital records if they have not  received them.  Once you are discharged, your primary care physician will handle any further medical issues. Please note that NO REFILLS for any discharge medications will be authorized once you are discharged, as it is imperative that you return to your primary care physician (or establish a relationship with a primary care physician if you do not have one) for your post hospital discharge needs so that they can reassess your need for medications and monitor your lab values.  Please get a complete blood count and chemistry panel checked by your Primary MD at your next visit, and again as instructed by your Primary MD.  Get Medicines reviewed and adjusted: Please take all your medications with you for your next visit with your Primary MD  Laboratory/radiological data: Please request your Primary MD to go over all hospital tests and procedure/radiological results at the follow up, please ask your primary care provider to get all  Hospital records sent to his/her office.  In some cases, they will be blood work, cultures and biopsy results pending at the time of your discharge. Please request that your primary care provider follow up on these results.  If you are diabetic, please bring your blood sugar readings with you to your follow up appointment with primary care.    Please call and make your follow up appointments as soon as possible.    Also Note the following: If you experience worsening of your admission symptoms, develop shortness of breath, life threatening emergency, suicidal or homicidal thoughts you must seek medical attention immediately by calling 911 or calling your MD immediately  if symptoms less severe.  You must read complete instructions/literature along with all the possible adverse reactions/side effects for all the Medicines you take and that have been prescribed to you. Take any new Medicines after you have completely understood and accpet all the possible adverse  reactions/side effects.   Do not drive when taking Pain medications or sleeping medications (Benzodiazepines)  Do not take more than prescribed Pain, Sleep and Anxiety Medications. It is not advisable to combine anxiety,sleep and pain medications without talking with your primary care practitioner  Special Instructions: If you have smoked or chewed Tobacco  in the last 2 yrs please stop smoking, stop any regular Alcohol  and or any Recreational drug use.  Wear Seat belts while driving.  Do not drive if taking any narcotic, mind altering or controlled substances or recreational drugs or alcohol.

## 2020-02-17 NOTE — Discharge Summary (Signed)
Physician Discharge Summary  Cindy [name] KYH:[id number] DOB: [birth year] DOA: 02/09/2020  PCP: Avon Gully, MD Neurologist: Gerilyn Pilgrim  Admit date: 02/09/2020 Discharge date: 02/17/2020  Admitted From:  HOME  Disposition: HOME with 24/7 supervision (case management and social workers have been unable to arrange for any SNF placement or home health services for the patient due to recreational substance history and lack of medical insurance)  Recommendations for Outpatient Follow-up:  1. Follow up with PCP in 1 weeks 2. Follow up with neurology in 2 weeks  Home Health: unable to be arranged per social worker/case management  Discharge Condition: STABLE   CODE STATUS: FULL    Brief Hospitalization Summary: Please see all hospital notes, images, labs for full details of the hospitalization. ADMISSION HPI: Cindy [name] is a 59 y.o. female with medical history significant of LFTs abnormality history, history of multiple CVAs, hyperlipidemia, hypertension, polysubstance abuse including opioids and cocaine, seizure disorder who is brought via EMS to the emergency department after the patient started having seizures.  EMS was initially called because of family with patient being lethargic presents 24 hours or so.  When EMS arrived the patient was actively seizing and she received 5 mg of IV midazolam.  He was febrile in the emergency department without any obvious source.  She is still confused, does not follow simple commands and is unable to provide further information.  ED Course: Initial vital signs were temperature 102.4 F, pulse 118, respirations 18, blood pressure 130/107 mmHg and O2 sat 100% on 2 LPM via [place].  The patient received 2000 mL of NS bolus, 1000 mg of Keppra IVPB, cefepime, metronidazole and vancomycin.  Urinalysis shows small hemoglobinuria, proteinuria of the 100 mg/dL and rare bacteria microscopic examination.  UDS was positive for cocaine and benzodiazepines.  CBC showed a  white count of 14.5, hemoglobin 14.2 g/dL and platelets 151.  PT is/INR/PTT within normal limits.  SARS sent influenza A/B PCR was negative.  Alcohol level was normal.  Lactic acid was 2.2, sodium 127, potassium 3.5, chloride 96 and CO2 19 mmol/L.  Glucose 134, BUN 10 and creatinine 1.0 mg/dL.  LFTs were normal, except for an elevated total protein of 8.7 g/dL.  Imaging: A portable 1 view chest radiograph shows low lung volumes without any acute abnormality.  CT head without contrast did not show any acute intracranial normality.  There is generalized atrophy and chronic small vessel ischemia similar to prior exam, but advanced for the patient's age.  There was a lacunar infarct in the right basal ganglia that appears remote, but is new from March 2021.  Questionable sinusitis of the left sphenoid sinus due to fluid level with frothy debris is being seen.  Please see images and full radiology report for further detail.  Hospital Course  9/24:Patient was admitted with what appears to be a breakthrough seizure in the setting of positive cocaine use. She has received Ativan as well as 1 g of IV Keppra and will be started on Keppra 500 mg IV twice daily. EEG ordered with neurology consultation pending. She still appears quite somnolent. She was initially started on some antibiotics empirically, but procalcitonin is low and no other signs of infection or noted,therefore these will be discontinued. A.m. labs ordered.  9/25:Patient noted to be more awake and responsive this morning, but states "yes" to every question and does not answer questions appropriately. She is improved from yesterday and has been started on Vimpat with repeat loading dose of Keppra yesterday by  neurology. Continue to monitor through today and consider EEG if she does not have adequate neurological improvement by 9/26.  9/26:Per nursing staff, patient has been more alert and talkative overall, but she did receive Ativan  earlier this morning and is currently quite somnolent. 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  9/27:Patient is more awake and alert and responsive this morning although she remains mostly somnolent. Repeat EEG pending for reevaluation of seizure activity. Continue current treatments.  9/28: Patient remains alert and responsive this morning with no acute overnight events noted.  Repeat EEG with no seizure activity noted.  Plans to switch to oral antiepileptics per neurology which has been now ordered.  PT evaluation ordered as well, with likely need for placement to a facility, but insurance remains an issue per CSW.  9/29:  Pt slightly more responsive.  No further seizure activities.  Tolerating oral meds.  Unable to discharge due to [facility] working out safe discharge plan as patient has no insurance, not qualifying for medicaid at this time, TOC is telling me that patient will have to discharge home with home health services.   9/30: no further seizure activity.  Pt awaiting for SNF bed.  [facility] offered LOG pending approval of medicaid benefits.   Assessment & Plan:   Principal Problem:   Seizures (HCC) Active Problems:   HYPERCHOLESTEROLEMIA   Essential hypertension   Glucose intolerance   Acute encephalopathy   Hyponatremia   SIRS (systemic inflammatory response syndrome) (HCC)   Cocaine abuse (HCC)   Seizure (HCC)   Acute encephalopathy secondary tostatus epilepticus-improved -Patient currently more awake and alert and feeding self -Continue now on Keppra 1500 mg twice daily and Vimpat 200 mg twice daily oral per neurology -EEGwith significant findings reviewed by neurology, repeat 9/27 with no seizure activity noted -CT head with no acute findings -Appreciateongoingneurology evaluation.  Outpatient neurology follow up.  -PT recommending SNF and unfortunately TOC team has been unable to arrange due to substance abuse  history and lack of insurance.    Polysubstance abuse -Noted to be positive for benzodiazepines and cocaine -Likely related to breakthrough seizureversus alcohol withdrawal -Counseled on substance abuse and provided with resources in outpatient setting -TOC consulted.   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 -TSHwithin normal limits  Dyslipidemia -Plan to resume statin today  Essential hypertension-better controlled -Resume home amlodipine, added metoprolol 25 mg BID  DVT prophylaxis:Lovenox Code Status:Full code Family Communication:Husband at bedside 9/26,9/30, 10/1 Disposition Plan: Status is: Inpatient  Discharge Diagnoses:  Principal Problem:   Seizures (HCC) Active Problems:   HYPERCHOLESTEROLEMIA   Essential hypertension   Glucose intolerance   Acute encephalopathy   Hyponatremia   SIRS (systemic inflammatory response syndrome) (HCC)   Cocaine abuse (HCC)   Seizure [facility])   Discharge Instructions: Discharge Instructions    Ambulatory referral to Neurology   Complete by: As directed    An appointment is requested in approximately: 4 weeks     Allergies as of 02/17/2020   No Known Allergies     Medication List    STOP taking these medications   clopidogrel 75 MG tablet Commonly known as: PLAVIX   levETIRAcetam 500 MG 24 hr tablet Commonly known as: KEPPRA XR Replaced by: levETIRAcetam 750 MG tablet   multivitamin Liqd   pantoprazole 40  MG tablet Commonly known as: PROTONIX   QUEtiapine 25 MG tablet Commonly known as: SEROQUEL   sennosides 8.8 MG/5ML syrup Commonly known as: SENOKOT     TAKE these medications   amLODipine 10 MG tablet Commonly known as: NORVASC TAKE 1 TABLET BY MOUTH EVERY DAY (NEEDS TO MAKE AN APPOINTMENT)   folic acid 1 MG  tablet Commonly known as: FOLVITE Take 1 tablet (1 mg total) by mouth daily. Start taking on: February 18, 2020   lacosamide 200 MG Tabs tablet Commonly known as: VIMPAT Take 1 tablet (200 mg total) by mouth 2 (two) times daily.   levETIRAcetam 750 MG tablet Commonly known as: KEPPRA Take 2 tablets (1,500 mg total) by mouth 2 (two) times daily. Replaces: levETIRAcetam 500 MG 24 hr tablet   metoprolol tartrate 25 MG tablet Commonly known as: LOPRESSOR Take 1 tablet (25 mg total) by mouth 2 (two) times daily.   multivitamin with minerals Tabs tablet Take 1 tablet by mouth daily. Start taking on: February 18, 2020   simvastatin 20 MG tablet Commonly known as: ZOCOR Take 1 tablet (20 mg total) by mouth daily at 6 PM. What changed: when to take this   thiamine 100 MG tablet Take 1 tablet (100 mg total) by mouth daily. Start taking on: February 18, 2020       Follow-up Information    Avon Gully, MD. Schedule an appointment as soon as possible for a visit in 1 week(s).   Specialty: Internal Medicine Contact information: [address] [place] [address] [phone]        Beryle Beams, MD. Schedule an appointment as soon as possible for a visit in 2 week(s).   Specialty: Neurology Why: Hospital Follow Up for Seizures  Contact information: [address] [place] [address] [phone]              No Known Allergies Allergies as of 02/17/2020   No Known Allergies     Medication List    STOP taking these medications   clopidogrel 75 MG tablet Commonly known as: PLAVIX   levETIRAcetam 500 MG 24 hr tablet Commonly known as: KEPPRA XR Replaced by: levETIRAcetam 750 MG tablet   multivitamin Liqd   pantoprazole 40 MG tablet Commonly known as: PROTONIX   QUEtiapine 25 MG tablet Commonly known as: SEROQUEL   sennosides 8.8 MG/5ML syrup Commonly known as: SENOKOT     TAKE these medications   amLODipine 10 MG tablet Commonly  known as: NORVASC TAKE 1 TABLET BY MOUTH EVERY DAY (NEEDS TO MAKE AN APPOINTMENT)   folic acid 1 MG tablet Commonly known as: FOLVITE Take 1 tablet (1 mg total) by mouth daily. Start taking on: February 18, 2020   lacosamide 200 MG Tabs tablet Commonly known as: VIMPAT Take 1 tablet (200 mg total) by mouth 2 (two) times daily.   levETIRAcetam 750 MG tablet Commonly known as: KEPPRA Take 2 tablets (1,500 mg total) by mouth 2 (two) times daily. Replaces: levETIRAcetam 500 MG 24 hr tablet   metoprolol tartrate 25 MG tablet Commonly known as: LOPRESSOR Take 1 tablet (25 mg total) by mouth 2 (two) times daily.   multivitamin with minerals Tabs tablet Take 1 tablet by mouth daily. Start taking on: February 18, 2020   simvastatin 20 MG tablet Commonly known as: ZOCOR Take 1 tablet (20 mg total) by mouth daily at 6 PM. What changed: when to take this   thiamine 100 MG tablet Take 1 tablet (100  mg total) by mouth daily. Start taking on: February 18, 2020       Procedures/Studies: CT Head Wo Contrast  Result Date: 02/09/2020 CLINICAL DATA:  Cerebral hemorrhage suspected Lethargy with seizure. EXAM: CT HEAD WITHOUT CONTRAST TECHNIQUE: Contiguous axial images were obtained from the base of the skull through the vertex without intravenous contrast. COMPARISON:  Head CT 08/05/2019, brain MRI 08/10/2019 FINDINGS: Brain: No acute hemorrhage. Generalized atrophy, mild but age advanced. Advanced periventricular white matter hypodensity consistent with chronic small vessel ischemia, particularly advanced for age. Lacunar infarct in the right basal ganglia. Remote but new from March 2021. Additional tiny bilateral basal gangliar lacunar infarcts are unchanged. No evidence of acute ischemia. No subdural or extra-axial collection. Vascular: Atherosclerosis of skullbase vasculature without hyperdense vessel or abnormal calcification. Skull: No fracture or focal lesion. Sinuses/Orbits: Fluid level with  frothy debris in the left side of sphenoid sinus. Remaining paranasal sinuses are clear. No mastoid effusion. Orbits are unremarkable. Other: None. IMPRESSION: 1. No acute intracranial abnormality. 2. Generalized atrophy and chronic small vessel ischemia similar to prior exam but advanced for age. Lacunar infarct in the right basal ganglia appears remote, but new from March 2021. 3. Fluid level with frothy debris in the left side of the sphenoid sinus, can be seen with acute sinusitis. Electronically Signed   By: Narda Rutherford M.D.   On: 02/09/2020 20:36   DG Chest Port 1 View  Result Date: 02/09/2020 CLINICAL DATA:  Weakness. EXAM: PORTABLE CHEST 1 VIEW COMPARISON:  08/07/2019 FINDINGS: Lung volumes are low. Upper normal heart size likely accentuated by low lung volumes. Unchanged mediastinal contours. No acute or focal airspace disease. No pleural effusion or pneumothorax. No pulmonary edema. Chronic right AC joint separation. Bones are under mineralized. No acute osseous abnormalities are seen. IMPRESSION: Low lung volumes without acute abnormality. Electronically Signed   By: Narda Rutherford M.D.   On: 02/09/2020 20:37   EEG adult  Result Date: 02/13/2020 Beryle Beams, MD     02/13/2020  5:36 PM [facility] Kofi A. Gerilyn Pilgrim, MD     [email or web address]       HISTORY: This is a 59 year old female who presents with recurrent seizures / status epilepticus. She has persistent encephalopathy with concern that she could be having ongoing nonconvulsive epileptic seizures. MEDICATIONS: Current Facility-Administered Medications:   acetaminophen (TYLENOL) tablet 650 mg, 650 mg, Oral, Q6H PRN **OR** acetaminophen (TYLENOL) suppository 650 mg, 650 mg, Rectal, Q6H PRN, Bobette Mo, MD   amLODipine (NORVASC) tablet 10 mg, 10 mg, Oral, Daily, Sherryll Burger, Pratik D, DO, 10 mg at 02/13/20 1610   Chlorhexidine Gluconate Cloth 2 % PADS 6 each, 6 each, Topical, Daily, Sherryll Burger, Pratik D, DO   enoxaparin  (LOVENOX) injection 40 mg, 40 mg, Subcutaneous, Q24H, Bobette Mo, MD, 40 mg at 02/13/20 9604   feeding supplement (ENSURE ENLIVE) (ENSURE ENLIVE) liquid 237 mL, 237 mL, Oral, BID BM, Shah, Pratik D, DO, 237 mL at 02/13/20 1529   folic acid (FOLVITE) tablet 1 mg, 1 mg, Oral, Daily, Sherryll Burger, Pratik D, DO, 1 mg at 02/13/20 5409   labetalol (NORMODYNE) injection 10 mg, 10 mg, Intravenous, Q2H PRN, Sherryll Burger, Pratik D, DO, 10 mg at 02/10/20 1843   lacosamide (VIMPAT) 200 mg in sodium chloride 0.9 % 25 mL IVPB, 200 mg, Intravenous, Q12H, [place], Kofi, MD, Stopped at 02/13/20 1203   levETIRAcetam (KEPPRA) IVPB 1500 mg/ 100 mL premix, 1,500 mg, Intravenous, Q12H, [place], Kofi, MD, Last Rate: 400 mL/hr at 02/13/20 1733,  1,500 mg at 02/13/20 1733   LORazepam (ATIVAN) injection 1 mg, 1 mg, Intravenous, Q4H PRN, Sherryll Burger, Pratik D, DO, 1 mg at 02/12/20 0616   LORazepam (ATIVAN) tablet 1-4 mg, 1-4 mg, Oral, Q1H PRN **OR** LORazepam (ATIVAN) injection 1-4 mg, 1-4 mg, Intravenous, Q1H PRN, Sherryll Burger, Pratik D, DO, 1 mg at 02/12/20 1317   multivitamin with minerals tablet 1 tablet, 1 tablet, Oral, Daily, Sherryll Burger, Pratik D, DO, 1 tablet at 02/13/20 0928   ondansetron (ZOFRAN) tablet 4 mg, 4 mg, Oral, Q6H PRN **OR** ondansetron (ZOFRAN) injection 4 mg, 4 mg, Intravenous, Q6H PRN, Bobette Mo, MD   thiamine tablet 100 mg, 100 mg, Oral, Daily, 100 mg at 02/13/20 1610 **OR** thiamine (B-1) injection 100 mg, 100 mg, Intravenous, Daily, Sherryll Burger, Pratik D, DO ANALYSIS: A 16 channel recording using standard 10 20 measurements is conducted for 23 minutes.  The background posterior rhythm gets as high as 7-8 hertz. There is beta activity observed in the frontal areas. The recording however is replete with theta and even delta slowing. Photic stimulation and hyperventilation are not carried out. There is no focal or lateral slowing noted. There is no clear epileptiform activities noted. IMPRESSION: 1. This recording shows moderate  global slowing indicating a moderate global encephalopathy. However, no epileptiform discharges are noted. Kofi A. Gerilyn Pilgrim, M.D. Diplomate, Biomedical engineer of Psychiatry and Neurology ( Neurology).   EEG adult  Result Date: 02/10/2020 Charlsie Quest, MD     02/10/2020  5:40 PM Patient Name: Cindy [name] MRN: [id number] Epilepsy Attending: Charlsie Quest Referring Physician/Provider: Dr Maurilio Lovely Date: 02/10/2020 Duration: 23.29 mins Patient history: 59yo F with breakthrough seizure. EEG to evaluate for seizure. Level of alertness:  lethargic AEDs during EEG study: LEV Technical aspects: This EEG study was done with scalp electrodes positioned according to the 10-20 International system of electrode placement. Electrical activity was acquired at a sampling rate of  and reviewed with a high frequency filter of  and a low frequency filter of . EEG data were recorded continuously and digitally stored. Description: No posterior dominant rhythm was seen. EEG showed continuous 3 to 6 Hz theta-delta slowing in left hemisphere as well as disorganised 6-9z theta-alpha activity in right hemisphere. Periodic epileptiform discharges were seen in left hemisphere, maximal left posterior quadrant at . Hyperventilation and photic stimulation were not performed.   ABNORMALITY -Continued slow, lateralized left hemisphere -Periodic epileptiform discharges, left hemisphere, maximal left posterior quadrant IMPRESSION: This study showed evidence of epileptogenicity and cortical dysfunction arising from left hemisphere, maximal left posterior quadrant suggestive of underlying structural abnormality. This pattern is on the ictal-interictal continuum with higher potential for seizure recurrence. Additionally, there is evidence of severe diffuse encephalopathy, non specific etiology but could be secondary to sedation, seizure, post-ictal state. No seizures were seen throughout the recording. Priyanka [name]      Subjective: Pt is much more alert and feeding self this morning.    Discharge Exam: Vitals:   02/17/20 0425 02/17/20 0515  BP: (!) 173/87   Pulse: (!) 123 93  Resp: 20   Temp: 98.1 F (36.7 C)   SpO2: 100%    Vitals:   02/16/20 1452 02/16/20 2047 02/17/20 0425 02/17/20 0515  BP: (!) 145/82 (!) 143/92 (!) 173/87   Pulse: 88 85 (!) 123 93  Resp:  20 20   Temp: 97.9 F (36.6 C) 97.6 F (36.4 C) 98.1 F (36.7 C)   TempSrc: Oral Oral Oral   SpO2: 100%  100% 100%   Weight:      Height:       General exam: Appears calm and comfortable  Respiratory system: Clear to auscultation. Respiratory effort normal. Cardiovascular system: normal S1 & S2 heard.  Gastrointestinal system: Abdomen is nondistended, soft and nontender. Central nervous system: Alert and awake Extremities: No edema Skin: No rashes, lesions or ulcers Psychiatry: Judgement and insight appear normal. Mood & affect appropriate.   The results of significant diagnostics from this hospitalization (including imaging, microbiology, ancillary and laboratory) are listed below for reference.     Microbiology: Recent Results (from the past 240 hour(s))  Respiratory Panel by RT PCR (Flu A&B, Covid) -     Status: None   Collection Time: 02/09/20  8:21 PM   Specimen: Nasopharyngeal  Result Value Ref Range Status   SARS Coronavirus 2 by RT PCR NEGATIVE NEGATIVE Final    Comment: (NOTE) SARS-CoV-2 target nucleic acids are NOT DETECTED.  The SARS-CoV-2 RNA is generally detectable in upper respiratoy specimens during the acute phase of infection. The lowest concentration of SARS-CoV-2 viral copies this assay can detect is 131 copies/mL. A negative result does not preclude SARS-Cov-2 infection and should not be used as the sole basis for treatment or other patient management decisions. A negative result may occur with  improper specimen collection/handling, submission of specimen other than nasopharyngeal swab, presence of  viral mutation(s) within the areas targeted by this assay, and inadequate number of viral copies (<131 copies/mL). A negative result must be combined with clinical observations, patient history, and epidemiological information. The expected result is Negative.  Fact Sheet for Patients:  https://www.moore.com/  Fact Sheet for Healthcare Providers:  https://www.young.biz/  This test is no t yet approved or cleared by the [place] FDA and  has been authorized for detection and/or diagnosis of SARS-CoV-2 by FDA under an Emergency Use Authorization (EUA). This EUA will remain  in effect (meaning this test can be used) for the duration of the COVID-19 declaration under Section 564(b)(1) of the Act, 21 U.S.C. section 360bbb-3(b)(1), unless the authorization is terminated or revoked sooner.     Influenza A by PCR NEGATIVE NEGATIVE Final   Influenza B by PCR NEGATIVE NEGATIVE Final    Comment: (NOTE) The Xpert Xpress SARS-CoV-2/FLU/RSV assay is intended as an aid in  the diagnosis of influenza from Nasopharyngeal swab specimens and  should not be used as a sole basis for treatment. Nasal washings and  aspirates are unacceptable for Xpert Xpress SARS-CoV-2/FLU/RSV  testing.  Fact Sheet for Patients: https://www.moore.com/  Fact Sheet for Healthcare Providers: https://www.young.biz/  This test is not yet approved or cleared by the [place] FDA and  has been authorized for detection and/or diagnosis of SARS-CoV-2 by  FDA under an Emergency Use Authorization (EUA). This EUA will remain  in effect (meaning this test can be used) for the duration of the  Covid-19 declaration under Section 564(b)(1) of the Act, 21  U.S.C. section 360bbb-3(b)(1), unless the authorization is  terminated or revoked. Performed at [facility], [address], [place], [place] [address]   Blood Culture (routine x 2)      Status: None   Collection Time: 02/09/20  8:52 PM   Specimen: BLOOD  Result Value Ref Range Status   Specimen Description BLOOD BLOOD RIGHT ARM  Final   Special Requests   Final    BOTTLES DRAWN AEROBIC AND ANAEROBIC Blood Culture adequate volume   Culture   Final    NO  GROWTH 6 DAYS Performed at [facility], [address] [place], [place] [address]    Report Status 02/15/2020 FINAL  Final  Blood Culture (routine x 2)     Status: None   Collection Time: 02/09/20  8:57 PM   Specimen: BLOOD RIGHT HAND  Result Value Ref Range Status   Specimen Description BLOOD RIGHT HAND  Final   Special Requests   Final    BOTTLES DRAWN AEROBIC AND ANAEROBIC Blood Culture adequate volume   Culture   Final    NO GROWTH 6 DAYS Performed at [facility], [address] [place], [place] [address]    Report Status 02/15/2020 FINAL  Final  Urine culture     Status: None   Collection Time: 02/09/20  9:11 PM   Specimen: In/Out Cath Urine  Result Value Ref Range Status   Specimen Description   Final    IN/OUT CATH URINE Performed at [facility], [address], [place], [place] [address]    Special Requests   Final    NONE Performed at [facility], [address] [place], [place] [address]    Culture   Final    NO GROWTH Performed at [facility], [address] [place], [place] [address]    Report Status 02/11/2020 FINAL  Final     Labs: BNP (last 3 results) Recent Labs    08/05/19 1201  BNP 121.0*   Basic Metabolic Panel: Recent Labs  Lab 02/11/20 0655 02/11/20 0655 02/12/20 0744 02/13/20 0625 02/14/20 0305 02/15/20 0619 02/16/20 0319  NA 131*  --  129* 131* 130* 128*  --   K 3.2*  --  3.0* 3.8 3.5 3.7  --   CL 100  --  100 100 99 96*  --   CO2 20*  --  21* 20* 20* 22  --   GLUCOSE 101*  --  112* 79 95 99  --   BUN 10  --  6 8 13 14   --   CREATININE 0.88   < > 0.65 0.87 0.88 0.77 0.83  CALCIUM 8.4*  --  8.5* 8.8* 9.0 9.0  --   MG 1.5*   < > 1.7 1.6* 1.9 1.6* 2.5*    < > = values in this interval not displayed.   Liver Function Tests: Recent Labs  Lab 02/11/20 0655  AST 22  ALT 13  ALKPHOS 56  BILITOT 1.0  PROT 5.8*  ALBUMIN 2.6*   No results for input(s): LIPASE, AMYLASE in the last 168 hours. No results for input(s): AMMONIA in the last 168 hours. CBC: Recent Labs  Lab 02/11/20 0655 02/12/20 0744 02/13/20 0625 02/15/20 0619  WBC 8.3 7.1 7.8 9.2  NEUTROABS 6.5 5.7 5.8  --   HGB 11.4* 12.0 12.5 12.6  HCT 35.3* 37.2 39.1 39.1  MCV 85.7 84.4 86.3 85.2  PLT 250 267 262 283   Cardiac Enzymes: No results for input(s): CKTOTAL, CKMB, CKMBINDEX, TROPONINI in the last 168 hours. BNP: Invalid input(s): POCBNP CBG: No results for input(s): GLUCAP in the last 168 hours. D-Dimer No results for input(s): DDIMER in the last 72 hours. Hgb A1c No results for input(s): HGBA1C in the last 72 hours. Lipid Profile No results for input(s): CHOL, HDL, LDLCALC, TRIG, CHOLHDL, LDLDIRECT in the last 72 hours. Thyroid function studies No results for input(s): TSH, T4TOTAL, T3FREE, THYROIDAB in the last 72 hours.  Invalid input(s): FREET3 Anemia work up No results for input(s): VITAMINB12, FOLATE, FERRITIN,  TIBC, IRON, RETICCTPCT in the last 72 hours. Urinalysis    Component Value Date/Time   COLORURINE YELLOW 02/09/2020 2111   APPEARANCEUR CLEAR 02/09/2020 2111   LABSPEC 1.013 02/09/2020 2111   PHURINE 7.0 02/09/2020 2111   GLUCOSEU NEGATIVE 02/09/2020 2111   HGBUR SMALL (A) 02/09/2020 2111   BILIRUBINUR NEGATIVE 02/09/2020 2111   KETONESUR NEGATIVE 02/09/2020 2111   PROTEINUR 100 (A) 02/09/2020 2111   NITRITE NEGATIVE 02/09/2020 2111   LEUKOCYTESUR NEGATIVE 02/09/2020 2111   Sepsis Labs Invalid input(s): PROCALCITONIN,  WBC,  LACTICIDVEN Microbiology Recent Results (from the past 240 hour(s))  Respiratory Panel by RT PCR (Flu A&B, Covid) -     Status: None   Collection Time: 02/09/20  8:21 PM   Specimen: Nasopharyngeal  Result Value  Ref Range Status   SARS Coronavirus 2 by RT PCR NEGATIVE NEGATIVE Final    Comment: (NOTE) SARS-CoV-2 target nucleic acids are NOT DETECTED.  The SARS-CoV-2 RNA is generally detectable in upper respiratoy specimens during the acute phase of infection. The lowest concentration of SARS-CoV-2 viral copies this assay can detect is 131 copies/mL. A negative result does not preclude SARS-Cov-2 infection and should not be used as the sole basis for treatment or other patient management decisions. A negative result may occur with  improper specimen collection/handling, submission of specimen other than nasopharyngeal swab, presence of viral mutation(s) within the areas targeted by this assay, and inadequate number of viral copies (<131 copies/mL). A negative result must be combined with clinical observations, patient history, and epidemiological information. The expected result is Negative.  Fact Sheet for Patients:  https://www.moore.com/https://www.fda.gov/media/142436/download  Fact Sheet for Healthcare Providers:  https://www.young.biz/https://www.fda.gov/media/142435/download  This test is no t yet approved or cleared by the [place] FDA and  has been authorized for detection and/or diagnosis of SARS-CoV-2 by FDA under an Emergency Use Authorization (EUA). This EUA will remain  in effect (meaning this test can be used) for the duration of the COVID-19 declaration under Section 564(b)(1) of the Act, 21 U.S.C. section 360bbb-3(b)(1), unless the authorization is terminated or revoked sooner.     Influenza A by PCR NEGATIVE NEGATIVE Final   Influenza B by PCR NEGATIVE NEGATIVE Final    Comment: (NOTE) The Xpert Xpress SARS-CoV-2/FLU/RSV assay is intended as an aid in  the diagnosis of influenza from Nasopharyngeal swab specimens and  should not be used as a sole basis for treatment. Nasal washings and  aspirates are unacceptable for Xpert Xpress SARS-CoV-2/FLU/RSV  testing.  Fact Sheet for  Patients: https://www.moore.com/https://www.fda.gov/media/142436/download  Fact Sheet for Healthcare Providers: https://www.young.biz/https://www.fda.gov/media/142435/download  This test is not yet approved or cleared by the [place] FDA and  has been authorized for detection and/or diagnosis of SARS-CoV-2 by  FDA under an Emergency Use Authorization (EUA). This EUA will remain  in effect (meaning this test can be used) for the duration of the  Covid-19 declaration under Section 564(b)(1) of the Act, 21  U.S.C. section 360bbb-3(b)(1), unless the authorization is  terminated or revoked. Performed at [facility], [address] [place], [place] [address]   Blood Culture (routine x 2)     Status: None   Collection Time: 02/09/20  8:52 PM   Specimen: BLOOD  Result Value Ref Range Status   Specimen Description BLOOD BLOOD RIGHT ARM  Final   Special Requests   Final    BOTTLES DRAWN AEROBIC AND ANAEROBIC Blood Culture adequate volume   Culture   Final    NO GROWTH 6 DAYS Performed  at [facility], [address] [place], [place] [address]    Report Status 02/15/2020 FINAL  Final  Blood Culture (routine x 2)     Status: None   Collection Time: 02/09/20  8:57 PM   Specimen: BLOOD RIGHT HAND  Result Value Ref Range Status   Specimen Description BLOOD RIGHT HAND  Final   Special Requests   Final    BOTTLES DRAWN AEROBIC AND ANAEROBIC Blood Culture adequate volume   Culture   Final    NO GROWTH 6 DAYS Performed at [facility], [address], [place], [place] [address]    Report Status 02/15/2020 FINAL  Final  Urine culture     Status: None   Collection Time: 02/09/20  9:11 PM   Specimen: In/Out Cath Urine  Result Value Ref Range Status   Specimen Description   Final    IN/OUT CATH URINE Performed at [facility], [address] [place], [place] [address]    Special Requests   Final    NONE Performed at [facility], [address] [place], [place] [address]    Culture   Final    NO GROWTH Performed at [facility], [address], [place], [place] [address]    Report Status 02/11/2020 FINAL  Final   Time coordinating discharge: 35 mins   SIGNED:  Standley Dakins, MD  [facility] 02/17/2020, 11:20 AM How to contact the [facility] Attending or Consulting provider 7A - 7P or covering provider during after hours 7P -7A, for this patient?  1. Check the care team in [facility] and look for a) attending/consulting TR[facility] provider listed and b) the [facility] team listed 2. Log into [email or web address] and use [place]'s universal password to access. If you do not have the password, please contact the hospital operator. 3. Locate the [facility] provider you are looking for under [facility] and page to a number that you can be directly reached. 4. If you still have difficulty reaching the provider, please page the [facility] (Director on Call) for the Hospitalists listed on amion for assistance.

## 2020-02-17 NOTE — Progress Notes (Signed)
Pt too lethargic to swallow medications po. Able to arouse, opened mouth, then fell asleep with pills in mouth. Was able to retrieve the two pills back out of mouth. Notified on call M. Katherina Right to make aware. Ordered seizure medications per IV.

## 2020-02-17 NOTE — Progress Notes (Signed)
AC called to update patients husband regarding EMS transport being delayed and patient still being on transport list. No answer on number listed in contacts and no voicemail set up.

## 2020-02-17 NOTE — Plan of Care (Signed)

## 2020-02-17 NOTE — Progress Notes (Signed)
Physical Therapy Treatment Patient Details Name: Cindy [name] MRN: [id number] DOB: Jan 08, 1961 Today's Date: 02/17/2020    History of Present Illness Cindy [name] is a 59 y.o. female with medical history significant of LFTs abnormality history, history of multiple CVAs, hyperlipidemia, hypertension, polysubstance abuse including opioids and cocaine, seizure disorder who is brought via EMS to the emergency department after the patient started having seizures.  EMS was initially called because of family with patient being lethargic presents 24 hours or so.  When EMS arrived the patient was actively seizing and she received 5 mg of IV midazolam.  He was febrile in the emergency department without any obvious source.  She is still confused, does not follow simple commands and is unable to provide further information.    PT Comments    Patient presents very restless and requires Max verbal/tactile cueing to follow any instructions with poor carryover possibly due to confusion and impulsive behavior.  Patient unable to maintain sitting balance with frequent falling backwards, limited to standing for 5-10 seconds before having to sit due to BLE weakness.  Patient put back to bed after therapy with RN present in room.  Patient will benefit from continued physical therapy in hospital and recommended venue below to increase strength, balance, endurance for safe ADLs and gait.    Follow Up Recommendations  SNF     Equipment Recommendations  None recommended by PT    Recommendations for Other Services       Precautions / Restrictions Precautions Precautions: Fall Restrictions Weight Bearing Restrictions: No    Mobility  Bed Mobility Overal bed mobility: Needs Assistance Bed Mobility: Supine to Sit;Sit to Supine     Supine to sit: Max assist Sit to supine: Max assist   General bed mobility comments: limited mostly due to confusion and resisting movement  Transfers Overall transfer level:  Needs assistance Equipment used: 1 person hand held assist Transfers: Sit to/from Stand Sit to Stand: Max assist;Mod assist         General transfer comment: required Max verbal/tactile cueing to participate with sit to standing, poor standing balance due to BLE weakness  Ambulation/Gait                 Stairs             Wheelchair Mobility    Modified Rankin (Stroke Patients Only)       Balance Overall balance assessment: Needs assistance Sitting-balance support: Feet supported;No upper extremity supported Sitting balance-Leahy Scale: Poor Sitting balance - Comments: seated at EOB, frequent falling foward/backwards                                    Cognition Arousal/Alertness: Awake/alert Behavior During Therapy: Restless;Flat affect;Impulsive Overall Cognitive Status: No family/caregiver present to determine baseline cognitive functioning                                        Exercises      General Comments        Pertinent Vitals/Pain Pain Assessment: Faces Faces Pain Scale: No hurt    Home Living                      Prior Function            PT Goals (  current goals can now be found in the care plan section) Acute Rehab PT Goals Patient Stated Goal: not stated, patient's spouse wants patient to be able to walk before returning home PT Goal Formulation: With patient/family Time For Goal Achievement: 02/29/20 Potential to Achieve Goals: Fair Progress towards PT goals: Progressing toward goals    Frequency    Min 3X/week      PT Plan      Co-evaluation              AM-PAC PT "6 Clicks" Mobility   Outcome Measure  Help needed turning from your back to your side while in a flat bed without using bedrails?: A Lot Help needed moving from lying on your back to sitting on the side of a flat bed without using bedrails?: A Lot Help needed moving to and from a bed to a chair (including  a wheelchair)?: A Lot Help needed standing up from a chair using your arms (e.g., wheelchair or bedside chair)?: A Lot Help needed to walk in hospital room?: Total Help needed climbing 3-5 steps with a railing? : Total 6 Click Score: 10    End of Session   Activity Tolerance: Treatment limited secondary to agitation;Patient limited by fatigue Patient left: in bed;with call bell/phone within reach;with bed alarm set;with nursing/sitter in room Nurse Communication: Mobility status PT Visit Diagnosis: Unsteadiness on feet (R26.81);Other abnormalities of gait and mobility (R26.89);Muscle weakness (generalized) (M62.81)     Time: 2122-4825 PT Time Calculation (min) (ACUTE ONLY): 16 min  Charges:  $Therapeutic Activity: 8-22 mins                     12:24 PM, 02/17/20 Ocie Bob, MPT Physical Therapist with [facility] [phone] office [phone] mobile phone

## 2020-02-18 ENCOUNTER — Emergency Department (HOSPITAL_COMMUNITY)
Admission: EM | Admit: 2020-02-18 | Discharge: 2020-02-18 | Disposition: A | Discharge status: Home / Self Care | Payer: Medicaid Other | Attending: Emergency Medicine | Admitting: Emergency Medicine

## 2020-02-18 DIAGNOSIS — Z79899 Other long term (current) drug therapy: Secondary | ICD-10-CM | POA: Insufficient documentation

## 2020-02-18 DIAGNOSIS — R569 Unspecified convulsions: Secondary | ICD-10-CM | POA: Insufficient documentation

## 2020-02-18 DIAGNOSIS — I10 Essential (primary) hypertension: Secondary | ICD-10-CM | POA: Insufficient documentation

## 2020-02-18 DIAGNOSIS — G40909 Epilepsy, unspecified, not intractable, without status epilepticus: Secondary | ICD-10-CM

## 2020-02-18 NOTE — Progress Notes (Signed)
Attempted to contact Cindy [name] at [phone] home/cell number listed under patient contacts.  Each time I called I received a voice message stating " the voice mail is full please try again later. Good bye."  I attempted no less than four calls to let the spouse know that EMS was on the way with [name].

## 2020-02-18 NOTE — ED Notes (Signed)
Pt husband here to pick up pt. Dr Lynelle Doctor notified.

## 2020-02-18 NOTE — Discharge Instructions (Signed)
Please follow the discharge instructions that were given to her from the hospital.

## 2020-02-18 NOTE — ED Triage Notes (Signed)
Pt discharged from [facility] at 0018. EMS transported pt to home but they were not able to get in touch with any family to let them in the house. Pt then brought to [facility] by EMS because of that. House [facility] Abby M finally able to reach family. Husband states he will come and pick up pt.

## 2020-02-18 NOTE — ED Provider Notes (Signed)
[facility] EMERGENCY DEPARTMENT Provider Note   CSN: [id number] Arrival date & time: 02/18/20  0146   Time seen 2:15 AM  History Chief Complaint  Patient presents with  . Waiting for ride   Level 5 caveat for encephalopathy  Cindy [name] is a 59 y.o. female.  HPI   Patient had been admitted to the hospital from September 23 through October 1 and was actually discharged from the hospital around 12:30 AM on October 2.  However when EMS got her home nobody answered at the door.  Patient was brought back to the hospital and placed in the emergency department.  Patient presented with history of prior strokes, polysubstance abuse including cocaine abuse, and seizure disorder.  She was evaluated in the hospital and received Ativan for seizure activity and was started on IV Keppra.  She was also started on Vimpat.  She was evaluated by neurology and had a EEG done which showed moderate global slowing indicating moderate global encephalopathy a without epileptiform discharges noted on September 27 and on September 24 her EEG EEG showed a left the [place] D and cortical dysfunction arising from left hemisphere, maximal left posterior quadrant suggestive of underlying structural abnormality.  This pattern is on the ictal-interictal continuing with higher potential for seizure recurrence.  Additionally there is evidence of severe diffuse encephalopathy, nonspecific etiology but could be secondary to sedation, seizure, postictal state.  No seizures were seen throughout the recording.  Attempts were made to get her placed in a skilled nursing facility however she did not have funding and she has a history of current cocaine abuse.  Patient does not express any complaints on arrival to the ED.  PCP Avon Gully, MD   Past Medical History:  Diagnosis Date  . Abnormal LFTs    Hx of  . CVA (cerebral vascular accident) (HCC) 2005  . Hyperlipidemia   . Hypertension   . Polysubstance dependence  including opioid type drug, episodic abuse [facility])    cocaine/etoh     Patient Active Problem List   Diagnosis Date Noted  . Seizures (HCC) 02/09/2020  . Hyponatremia 02/09/2020  . SIRS (systemic inflammatory response syndrome) (HCC) 02/09/2020  . Cocaine abuse (HCC) 02/09/2020  . 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 Cerebral embolism with cerebral infarction 08/13/2019  . Status epilepticus (HCC) 08/05/2019  . Acute encephalopathy 08/05/2019  . TRANSAMINASES, SERUM, ELEVATED 10/23/2009  . HYPERCHOLESTEROLEMIA 07/03/2008  . ALCOHOL USE 07/03/2008  . Essential hypertension 07/03/2008  . STROKE 07/03/2008  . DYSPHAGIA UNSPECIFIED 07/03/2008  . Glucose intolerance 07/03/2008  . Pain in joint, shoulder region 06/22/2008  . SPONDYLOSIS 06/22/2008  . CERVICALGIA 06/22/2008  . SPINAL STENOSIS 06/22/2008  . CERVICAL SPASM 06/22/2008    Past Surgical History:  Procedure Laterality Date  . ESOPHAGOGASTRODUODENOSCOPY N/A 08/16/2019   Procedure: ESOPHAGOGASTRODUODENOSCOPY (EGD);  Surgeon: Vida Rigger, MD;  Location: [facility] ENDOSCOPY;  Service: Endoscopy;  Laterality: N/A;     OB History    Gravida  2   Para  0   Term      Preterm      AB  2   Living        SAB  2   TAB      Ectopic      Multiple      Live Births              Family History  Problem Relation Age of Onset  .  Cancer Father        prostate   . Hypertension Mother   . Asthma Mother   . Bronchitis Mother     Social History   Tobacco Use  . Smoking status: Never Smoker  . Smokeless tobacco: Never Used  Substance Use Topics  . Alcohol use: Yes    Comment: Drinks three 40 ounces beers 3 days per week , other days none to one 40 ounce beer per day  . Drug use: No    Types: Cocaine    Comment: not now  lives at home Lives with spouse  Home Medications Prior to Admission medications   Medication Sig Start Date End Date Taking? Authorizing  Provider  amLODipine (NORVASC) 10 MG tablet TAKE 1 TABLET BY MOUTH EVERY DAY (NEEDS TO MAKE AN APPOINTMENT) 02/17/20   Cleora Fleet, MD  folic acid (FOLVITE) 1 MG tablet Take 1 tablet (1 mg total) by mouth daily. 02/18/20   Johnson, Clanford L, MD  lacosamide (VIMPAT) 200 MG TABS tablet Take 1 tablet (200 mg total) by mouth 2 (two) times daily. 02/17/20   Johnson, Clanford L, MD  levETIRAcetam (KEPPRA) 750 MG tablet Take 2 tablets (1,500 mg total) by mouth 2 (two) times daily. 02/17/20 04/17/20  Johnson, Clanford L, MD  metoprolol tartrate (LOPRESSOR) 25 MG tablet Take 1 tablet (25 mg total) by mouth 2 (two) times daily. 02/17/20   Johnson, Clanford L, MD  Multiple Vitamin (MULTIVITAMIN WITH MINERALS) TABS tablet Take 1 tablet by mouth daily. 02/18/20   Johnson, Clanford L, MD  simvastatin (ZOCOR) 20 MG tablet Take 1 tablet (20 mg total) by mouth daily at 6 PM. 02/17/20   Johnson, Clanford L, MD  thiamine 100 MG tablet Take 1 tablet (100 mg total) by mouth daily. 02/18/20   Cleora Fleet, MD    Allergies    Patient has no known allergies.  Review of Systems   Review of Systems  Unable to perform ROS: Other    Physical Exam Updated Vital Signs There were no vitals taken for this visit.  Physical Exam Vitals and nursing note reviewed.  Constitutional:      Appearance: Normal appearance. She is normal weight.  HENT:     Head: Normocephalic and atraumatic.     Right Ear: External ear normal.     Left Ear: External ear normal.  Eyes:     Extraocular Movements: Extraocular movements intact.     Conjunctiva/sclera: Conjunctivae normal.     Pupils: Pupils are equal, round, and reactive to light.     Comments: Patient makes eye contact however she does not follow commands or answer questions  Cardiovascular:     Rate and Rhythm: Normal rate and regular rhythm.     Pulses: Normal pulses.     Heart sounds: Normal heart sounds. No murmur heard.   Pulmonary:     Effort: Pulmonary  effort is normal. No respiratory distress.     Breath sounds: Normal breath sounds.  Neurological:     Mental Status: She is alert.     ED Results / Procedures / Treatments   Labs (all labs ordered are listed, but only abnormal results are displayed) Labs Reviewed - No data to display  EKG None  Radiology No results found.  Procedures Procedures (including critical care time)  Medications Ordered in ED Medications - No data to display  ED Course  I have reviewed the triage vital signs and the nursing notes.  Pertinent labs &  imaging results that were available during my care of the patient were reviewed by me and considered in my medical decision making (see chart for details).    MDM Rules/Calculators/A&P                           House supervisor has been able to contact husband.  EMS will not try to take her home again, he states he will come pick her up.  She is not on oxygen at home.  2:50 AM patient is getting verbal with speech is hard to understand but stating she wants to go home.  Husband is here to take her home.  Patient is 96% on room air, she had been placed on oxygen by EMS for unknown reason.  Final Clinical Impression(s) / ED Diagnoses Final diagnoses:  Seizure disorder [facility])    Rx / DC Orders ED Discharge Orders    None     Plan discharge  Devoria Albe, MD, Concha Pyo, MD 02/18/20 [phone]

## 2020-04-25 ENCOUNTER — Encounter: Payer: Self-pay | Admitting: Diagnostic Neuroimaging

## 2020-04-25 ENCOUNTER — Ambulatory Visit: Payer: MEDICAID | Admitting: Diagnostic Neuroimaging

## 2020-04-25 ENCOUNTER — Telehealth: Payer: Self-pay | Admitting: *Deleted

## 2020-04-25 NOTE — Telephone Encounter (Signed)
Patient was no show for new patient appointment today. 

## 2020-08-23 ENCOUNTER — Emergency Department (HOSPITAL_COMMUNITY): Payer: Medicaid Other

## 2020-08-23 ENCOUNTER — Inpatient Hospital Stay (HOSPITAL_COMMUNITY): Payer: Medicaid Other

## 2020-08-23 ENCOUNTER — Inpatient Hospital Stay (HOSPITAL_COMMUNITY)
Admission: EM | Admit: 2020-08-23 | Discharge: 2020-08-31 | DRG: 100 | Disposition: A | Discharge status: Rehabilitation Facility | Payer: Medicaid Other | Attending: Family Medicine | Admitting: Family Medicine

## 2020-08-23 DIAGNOSIS — Z7189 Other specified counseling: Secondary | ICD-10-CM | POA: Diagnosis not present

## 2020-08-23 DIAGNOSIS — Z20822 Contact with and (suspected) exposure to covid-19: Secondary | ICD-10-CM | POA: Diagnosis present

## 2020-08-23 DIAGNOSIS — Z66 Do not resuscitate: Secondary | ICD-10-CM | POA: Diagnosis not present

## 2020-08-23 DIAGNOSIS — E43 Unspecified severe protein-calorie malnutrition: Secondary | ICD-10-CM | POA: Diagnosis present

## 2020-08-23 DIAGNOSIS — I672 Cerebral atherosclerosis: Secondary | ICD-10-CM | POA: Diagnosis present

## 2020-08-23 DIAGNOSIS — J9811 Atelectasis: Secondary | ICD-10-CM | POA: Diagnosis present

## 2020-08-23 DIAGNOSIS — E785 Hyperlipidemia, unspecified: Secondary | ICD-10-CM | POA: Diagnosis present

## 2020-08-23 DIAGNOSIS — Y900 Blood alcohol level of less than 20 mg/100 ml: Secondary | ICD-10-CM | POA: Diagnosis present

## 2020-08-23 DIAGNOSIS — E876 Hypokalemia: Secondary | ICD-10-CM | POA: Diagnosis present

## 2020-08-23 DIAGNOSIS — R1312 Dysphagia, oropharyngeal phase: Secondary | ICD-10-CM | POA: Diagnosis not present

## 2020-08-23 DIAGNOSIS — F05 Delirium due to known physiological condition: Secondary | ICD-10-CM | POA: Diagnosis present

## 2020-08-23 DIAGNOSIS — M6282 Rhabdomyolysis: Secondary | ICD-10-CM | POA: Diagnosis present

## 2020-08-23 DIAGNOSIS — G9341 Metabolic encephalopathy: Secondary | ICD-10-CM | POA: Diagnosis present

## 2020-08-23 DIAGNOSIS — R7401 Elevation of levels of liver transaminase levels: Secondary | ICD-10-CM | POA: Diagnosis present

## 2020-08-23 DIAGNOSIS — Z79899 Other long term (current) drug therapy: Secondary | ICD-10-CM

## 2020-08-23 DIAGNOSIS — Z8673 Personal history of transient ischemic attack (TIA), and cerebral infarction without residual deficits: Secondary | ICD-10-CM | POA: Diagnosis not present

## 2020-08-23 DIAGNOSIS — I10 Essential (primary) hypertension: Secondary | ICD-10-CM | POA: Diagnosis present

## 2020-08-23 DIAGNOSIS — R569 Unspecified convulsions: Secondary | ICD-10-CM

## 2020-08-23 DIAGNOSIS — Z8249 Family history of ischemic heart disease and other diseases of the circulatory system: Secondary | ICD-10-CM | POA: Diagnosis not present

## 2020-08-23 DIAGNOSIS — G40901 Epilepsy, unspecified, not intractable, with status epilepticus: Secondary | ICD-10-CM | POA: Diagnosis present

## 2020-08-23 DIAGNOSIS — R6510 Systemic inflammatory response syndrome (SIRS) of non-infectious origin without acute organ dysfunction: Secondary | ICD-10-CM | POA: Diagnosis not present

## 2020-08-23 DIAGNOSIS — K219 Gastro-esophageal reflux disease without esophagitis: Secondary | ICD-10-CM | POA: Diagnosis present

## 2020-08-23 DIAGNOSIS — F1010 Alcohol abuse, uncomplicated: Secondary | ICD-10-CM | POA: Diagnosis present

## 2020-08-23 DIAGNOSIS — N179 Acute kidney failure, unspecified: Secondary | ICD-10-CM | POA: Diagnosis present

## 2020-08-23 DIAGNOSIS — N390 Urinary tract infection, site not specified: Secondary | ICD-10-CM | POA: Diagnosis present

## 2020-08-23 DIAGNOSIS — F1910 Other psychoactive substance abuse, uncomplicated: Secondary | ICD-10-CM | POA: Diagnosis present

## 2020-08-23 DIAGNOSIS — G8191 Hemiplegia, unspecified affecting right dominant side: Secondary | ICD-10-CM | POA: Diagnosis present

## 2020-08-23 DIAGNOSIS — F1420 Cocaine dependence, uncomplicated: Secondary | ICD-10-CM | POA: Diagnosis present

## 2020-08-23 DIAGNOSIS — E440 Moderate protein-calorie malnutrition: Secondary | ICD-10-CM | POA: Insufficient documentation

## 2020-08-23 DIAGNOSIS — R4182 Altered mental status, unspecified: Secondary | ICD-10-CM | POA: Diagnosis present

## 2020-08-23 DIAGNOSIS — E7800 Pure hypercholesterolemia, unspecified: Secondary | ICD-10-CM | POA: Diagnosis present

## 2020-08-23 DIAGNOSIS — G928 Other toxic encephalopathy: Secondary | ICD-10-CM | POA: Diagnosis present

## 2020-08-23 DIAGNOSIS — Z681 Body mass index (BMI) 19 or less, adult: Secondary | ICD-10-CM

## 2020-08-23 DIAGNOSIS — R1310 Dysphagia, unspecified: Secondary | ICD-10-CM | POA: Diagnosis not present

## 2020-08-23 DIAGNOSIS — R7989 Other specified abnormal findings of blood chemistry: Secondary | ICD-10-CM | POA: Diagnosis not present

## 2020-08-23 DIAGNOSIS — R451 Restlessness and agitation: Secondary | ICD-10-CM | POA: Diagnosis not present

## 2020-08-23 DIAGNOSIS — G40909 Epilepsy, unspecified, not intractable, without status epilepticus: Secondary | ICD-10-CM | POA: Diagnosis not present

## 2020-08-23 DIAGNOSIS — R509 Fever, unspecified: Secondary | ICD-10-CM

## 2020-08-23 DIAGNOSIS — Z515 Encounter for palliative care: Secondary | ICD-10-CM | POA: Diagnosis not present

## 2020-08-23 DIAGNOSIS — W108XXA Fall (on) (from) other stairs and steps, initial encounter: Secondary | ICD-10-CM

## 2020-08-23 DIAGNOSIS — Z8719 Personal history of other diseases of the digestive system: Secondary | ICD-10-CM

## 2020-08-23 DIAGNOSIS — R1311 Dysphagia, oral phase: Secondary | ICD-10-CM | POA: Diagnosis not present

## 2020-08-23 LAB — DIFFERENTIAL
Abs Immature Granulocytes: 0.05 10*3/uL (ref 0.00–0.07)
Basophils Absolute: 0 10*3/uL (ref 0.0–0.1)
Basophils Relative: 0 %
Eosinophils Absolute: 0 10*3/uL (ref 0.0–0.5)
Eosinophils Relative: 0 %
Immature Granulocytes: 0 %
Lymphocytes Relative: 10 %
Lymphs Abs: 1.2 10*3/uL (ref 0.7–4.0)
Monocytes Absolute: 1.3 10*3/uL — ABNORMAL HIGH (ref 0.1–1.0)
Monocytes Relative: 11 %
Neutro Abs: 9.2 10*3/uL — ABNORMAL HIGH (ref 1.7–7.7)
Neutrophils Relative %: 79 %

## 2020-08-23 LAB — CBC
HCT: 40.6 % (ref 36.0–46.0)
Hemoglobin: 13.7 g/dL (ref 12.0–15.0)
MCH: 31.9 pg (ref 26.0–34.0)
MCHC: 33.7 g/dL (ref 30.0–36.0)
MCV: 94.6 fL (ref 80.0–100.0)
Platelets: 382 10*3/uL (ref 150–400)
RBC: 4.29 MIL/uL (ref 3.87–5.11)
RDW: 11.9 % (ref 11.5–15.5)
WBC: 11.8 10*3/uL — ABNORMAL HIGH (ref 4.0–10.5)
nRBC: 0 % (ref 0.0–0.2)

## 2020-08-23 LAB — COMPREHENSIVE METABOLIC PANEL
ALT: 18 U/L (ref 0–44)
AST: 56 U/L — ABNORMAL HIGH (ref 15–41)
Albumin: 3.7 g/dL (ref 3.5–5.0)
Alkaline Phosphatase: 73 U/L (ref 38–126)
Anion gap: 9 (ref 5–15)
BUN: 13 mg/dL (ref 8–23)
CO2: 21 mmol/L — ABNORMAL LOW (ref 22–32)
Calcium: 9.4 mg/dL (ref 8.9–10.3)
Chloride: 105 mmol/L (ref 98–111)
Creatinine, Ser: 1.21 mg/dL — ABNORMAL HIGH (ref 0.44–1.00)
GFR, Estimated: 51 mL/min — ABNORMAL LOW (ref >60)
Glucose, Bld: 127 mg/dL — ABNORMAL HIGH (ref 70–99)
Potassium: 4.2 mmol/L (ref 3.5–5.1)
Sodium: 135 mmol/L (ref 135–145)
Total Bilirubin: 1 mg/dL (ref 0.3–1.2)
Total Protein: 7.5 g/dL (ref 6.5–8.1)

## 2020-08-23 LAB — URINALYSIS, ROUTINE W REFLEX MICROSCOPIC
Bilirubin Urine: NEGATIVE
Glucose, UA: NEGATIVE mg/dL
Hgb urine dipstick: NEGATIVE
Ketones, ur: 5 mg/dL — AB
Leukocytes,Ua: NEGATIVE
Nitrite: NEGATIVE
Protein, ur: NEGATIVE mg/dL
Specific Gravity, Urine: 1.042 — ABNORMAL HIGH (ref 1.005–1.030)
pH: 7 (ref 5.0–8.0)

## 2020-08-23 LAB — APTT: aPTT: 26 seconds (ref 24–36)

## 2020-08-23 LAB — PROTIME-INR
INR: 1 (ref 0.8–1.2)
Prothrombin Time: 13 seconds (ref 11.4–15.2)

## 2020-08-23 LAB — I-STAT CHEM 8, ED
BUN: 17 mg/dL (ref 8–23)
Calcium, Ion: 1.1 mmol/L — ABNORMAL LOW (ref 1.15–1.40)
Chloride: 104 mmol/L (ref 98–111)
Creatinine, Ser: 1.1 mg/dL — ABNORMAL HIGH (ref 0.44–1.00)
Glucose, Bld: 127 mg/dL — ABNORMAL HIGH (ref 70–99)
HCT: 42 % (ref 36.0–46.0)
Hemoglobin: 14.3 g/dL (ref 12.0–15.0)
Potassium: 4.2 mmol/L (ref 3.5–5.1)
Sodium: 136 mmol/L (ref 135–145)
TCO2: 23 mmol/L (ref 22–32)

## 2020-08-23 LAB — RAPID URINE DRUG SCREEN, HOSP PERFORMED
Amphetamines: NOT DETECTED
Barbiturates: NOT DETECTED
Benzodiazepines: NOT DETECTED
Cocaine: POSITIVE — AB
Opiates: NOT DETECTED
Tetrahydrocannabinol: NOT DETECTED

## 2020-08-23 LAB — RESP PANEL BY RT-PCR (FLU A&B, COVID) ARPGX2
Influenza A by PCR: NEGATIVE
Influenza B by PCR: NEGATIVE
SARS Coronavirus 2 by RT PCR: NEGATIVE

## 2020-08-23 LAB — TSH: TSH: 1.955 u[IU]/mL (ref 0.350–4.500)

## 2020-08-23 LAB — SALICYLATE LEVEL: Salicylate Lvl: <7 mg/dL — ABNORMAL LOW (ref 7.0–30.0)

## 2020-08-23 LAB — CBG MONITORING, ED: Glucose-Capillary: 126 mg/dL — ABNORMAL HIGH (ref 70–99)

## 2020-08-23 LAB — ACETAMINOPHEN LEVEL: Acetaminophen (Tylenol), Serum: <10 ug/mL — ABNORMAL LOW (ref 10–30)

## 2020-08-23 LAB — CK: Total CK: 1002 U/L — ABNORMAL HIGH (ref 38–234)

## 2020-08-23 LAB — T4, FREE: Free T4: 1 ng/dL (ref 0.61–1.12)

## 2020-08-23 LAB — HIV ANTIBODY (ROUTINE TESTING W REFLEX): HIV Screen 4th Generation wRfx: NONREACTIVE

## 2020-08-23 LAB — LACTIC ACID, PLASMA
Lactic Acid, Venous: 1.2 mmol/L (ref 0.5–1.9)
Lactic Acid, Venous: 1.3 mmol/L (ref 0.5–1.9)

## 2020-08-23 LAB — ETHANOL: Alcohol, Ethyl (B): <10 mg/dL (ref <10)

## 2020-08-23 IMAGING — CT CT HEAD CODE STROKE
3 series · 15 of 47 positions shown, 18 images · non-contrast
Comparison: [DATE]

CLINICAL DATA: Code stroke.  Right-sided weakness

EXAM:
CT HEAD WITHOUT CONTRAST
TECHNIQUE: Contiguous axial images were obtained from the base of the skull
through the vertex without intravenous contrast.

[Series 2: head 5.0 st · axial · 0.42mm/px · z∈[+876,+1022]mm · 9 of 35 slices shown, 12 images]
[im 3/35  brain]
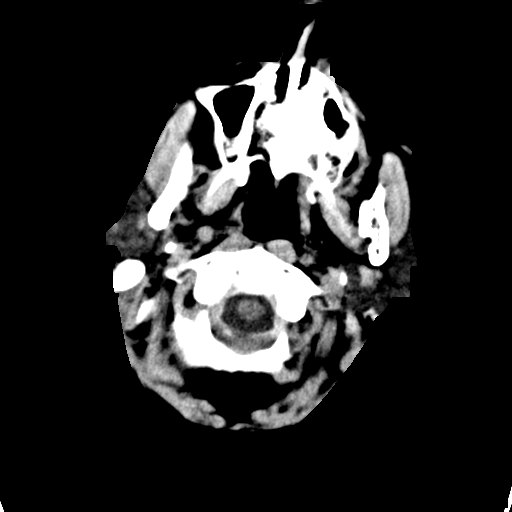
[im 3/35  bone]
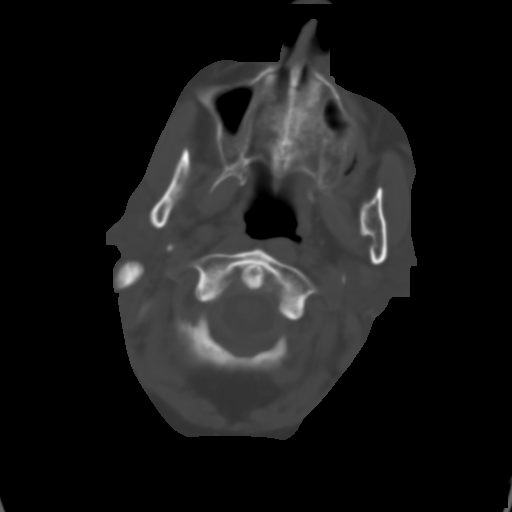
[im 6/35  brain]
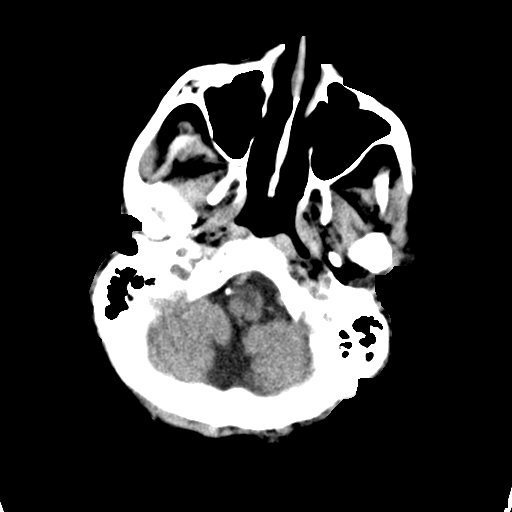
[im 10/35  brain]
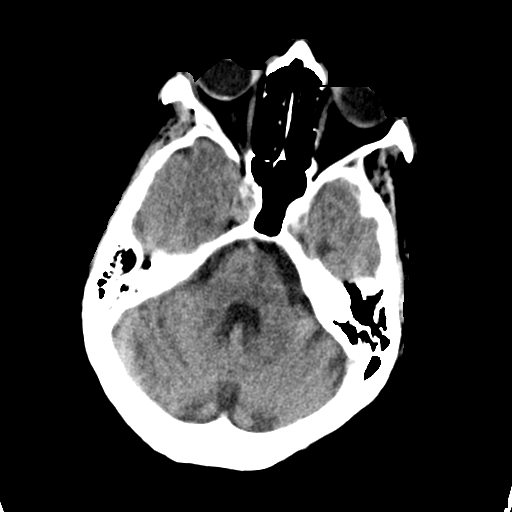
[im 13/35  brain]
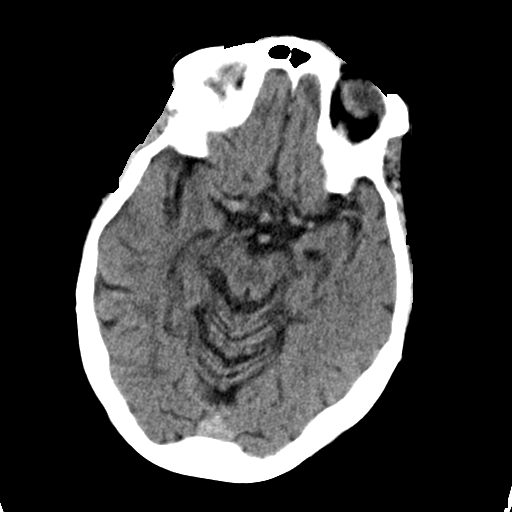
[im 18/35  brain]
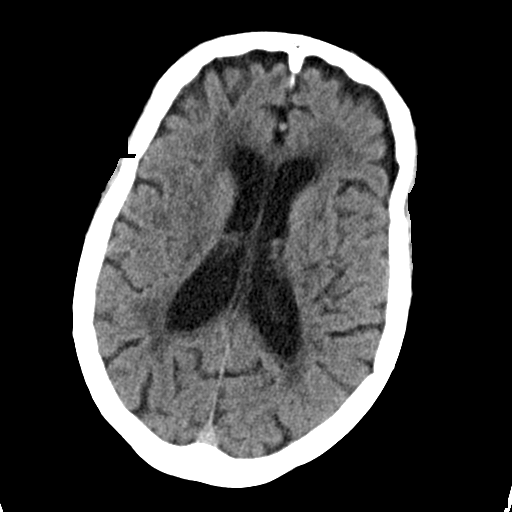
[im 18/35  bone]
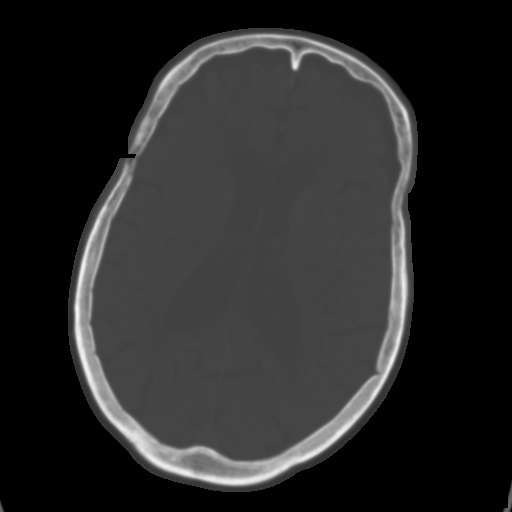
[im 22/35  brain]
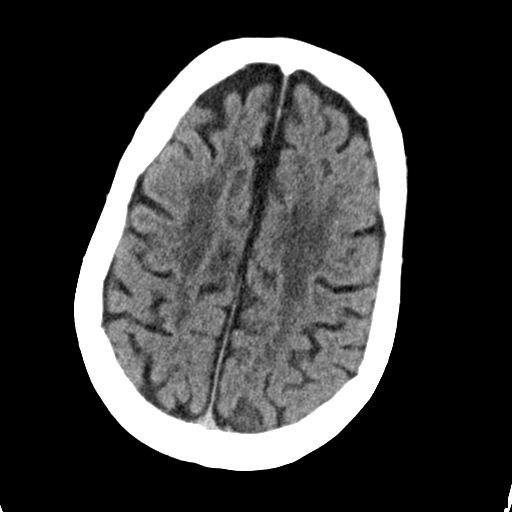
[im 25/35  brain]
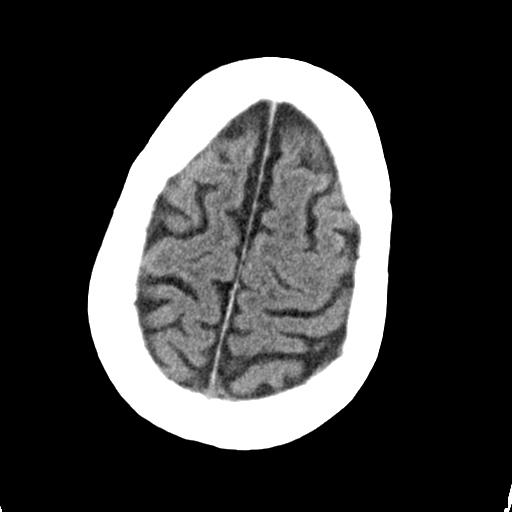
[im 29/35  brain]
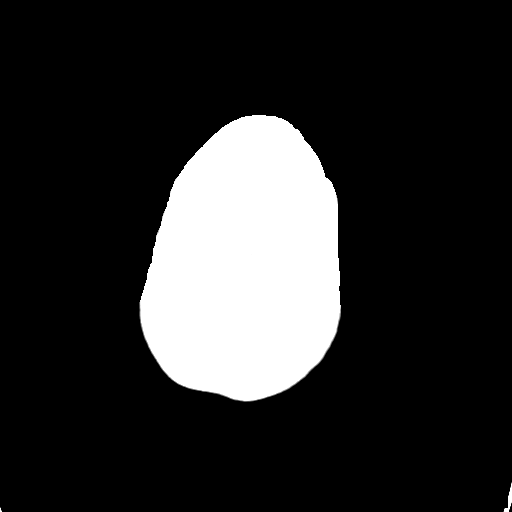
[im 32/35  brain]
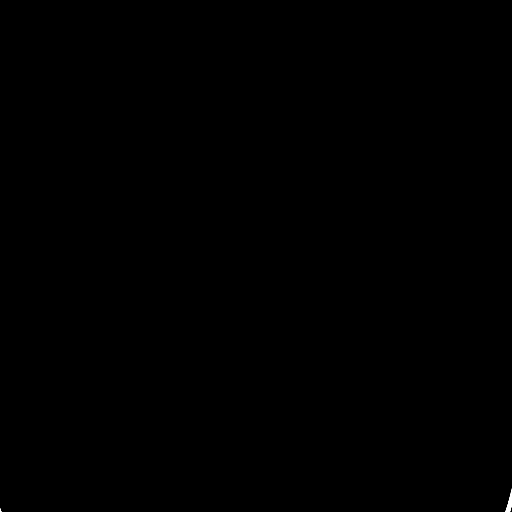
[im 32/35  bone]
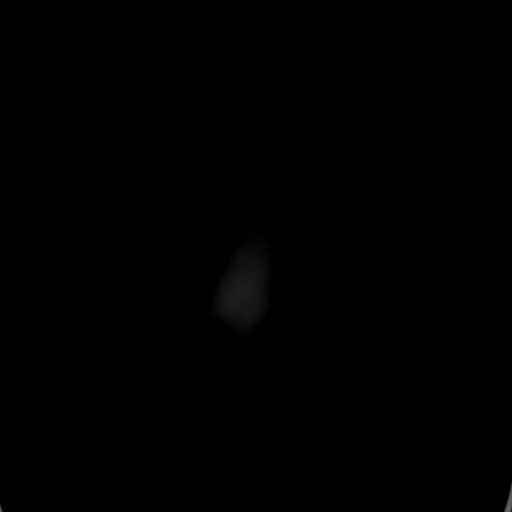

[Series 5: head 3.0 cor st · coronal · 0.33mm/px · 3 of 73 slices shown]
[im 25/73  brain]
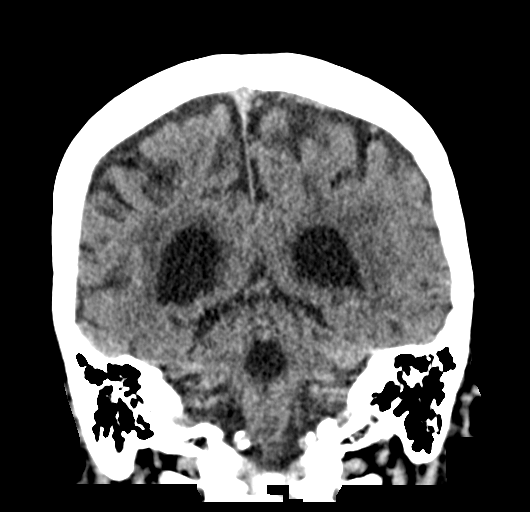
[im 33/73  brain]
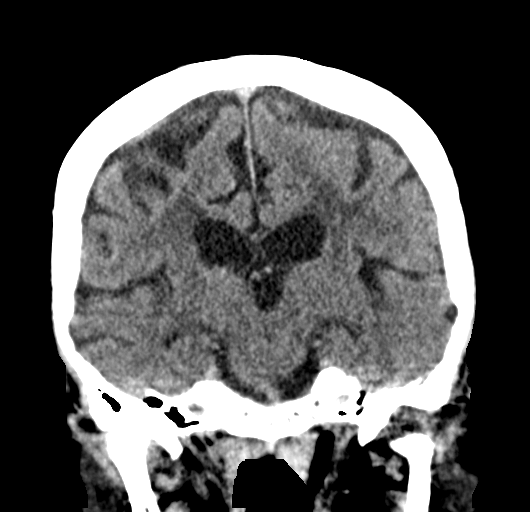
[im 41/73  brain]
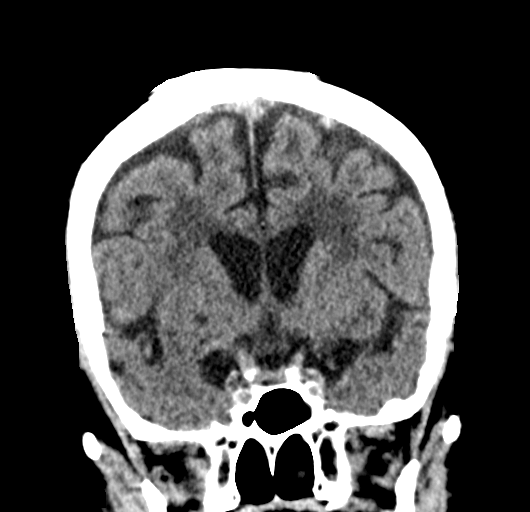

[Series 6: head 3.0 sag st · sagittal · 0.33mm/px · 3 of 56 slices shown]
[im 19/56  brain]
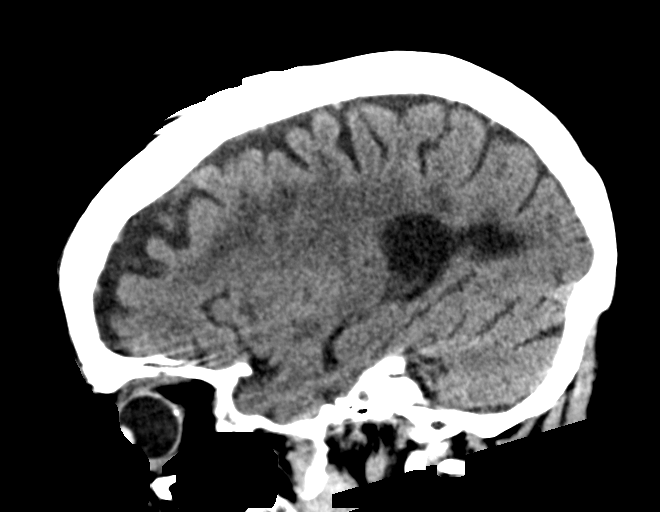
[im 28/56  brain]
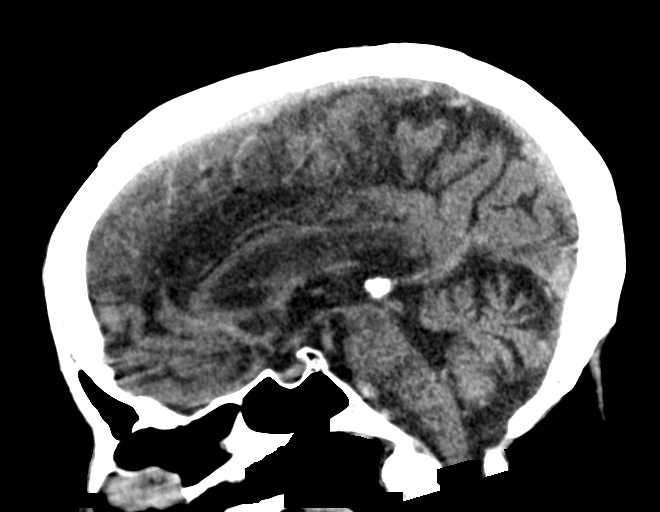
[im 37/56  brain]
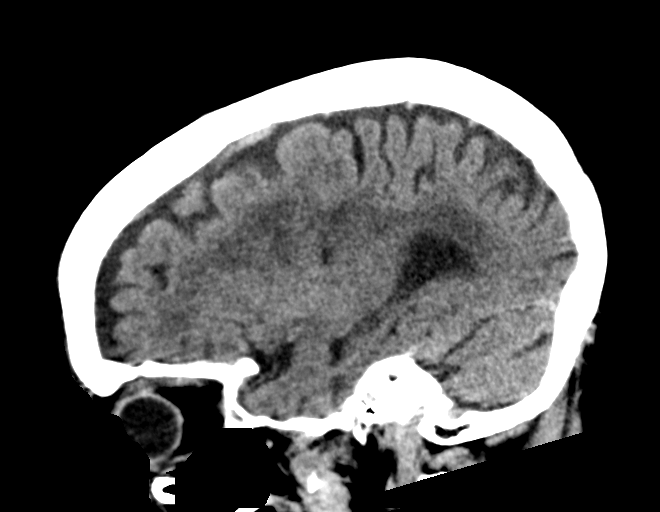

[15 of 47 positions shown; findings below may reference images not displayed]

FINDINGS: Brain: No acute intracranial hemorrhage, mass effect, or edema. No
definite new loss of gray-white differentiation. A small area
hypoattenuation along the anterior left insula appears to be through
an area of artifact. There are chronic infarcts of the right putamen
and thalamus. Additional patchy and confluent areas of
hypoattenuation in the supratentorial white matter are nonspecific
but probably reflect similar moderate chronic microvascular ischemic
changes. Prominence of the ventricles and sulci reflects similar
parenchymal volume loss.

Vascular: No hyperdense vessel. There is intracranial
atherosclerotic calcification at the skull base.

Skull: Unremarkable.

Sinuses/Orbits: No acute abnormality.

Other: Mastoid air cells are clear.

ASPECTS (Alberta Stroke Program Early CT Score)

- Ganglionic level infarction (caudate, lentiform nuclei, internal
capsule, insula, M1-M3 cortex): 7

- Supraganglionic infarction (M4-M6 cortex): 3

Total score (0-10 with 10 being normal): 10
IMPRESSION: There is no acute intracranial hemorrhage or evidence of acute
infarction. ASPECT score is 10.

These results were communicated to Dr. AMNON at [DATE] on [DATE] by
text page via the AMION messaging system.

## 2020-08-23 IMAGING — DX DG CHEST 1V PORT
1 series · 1 of 1 positions shown · non-contrast
Comparison: [DATE]

CLINICAL DATA: Seizure

EXAM:
PORTABLE CHEST 1 VIEW

[chest]
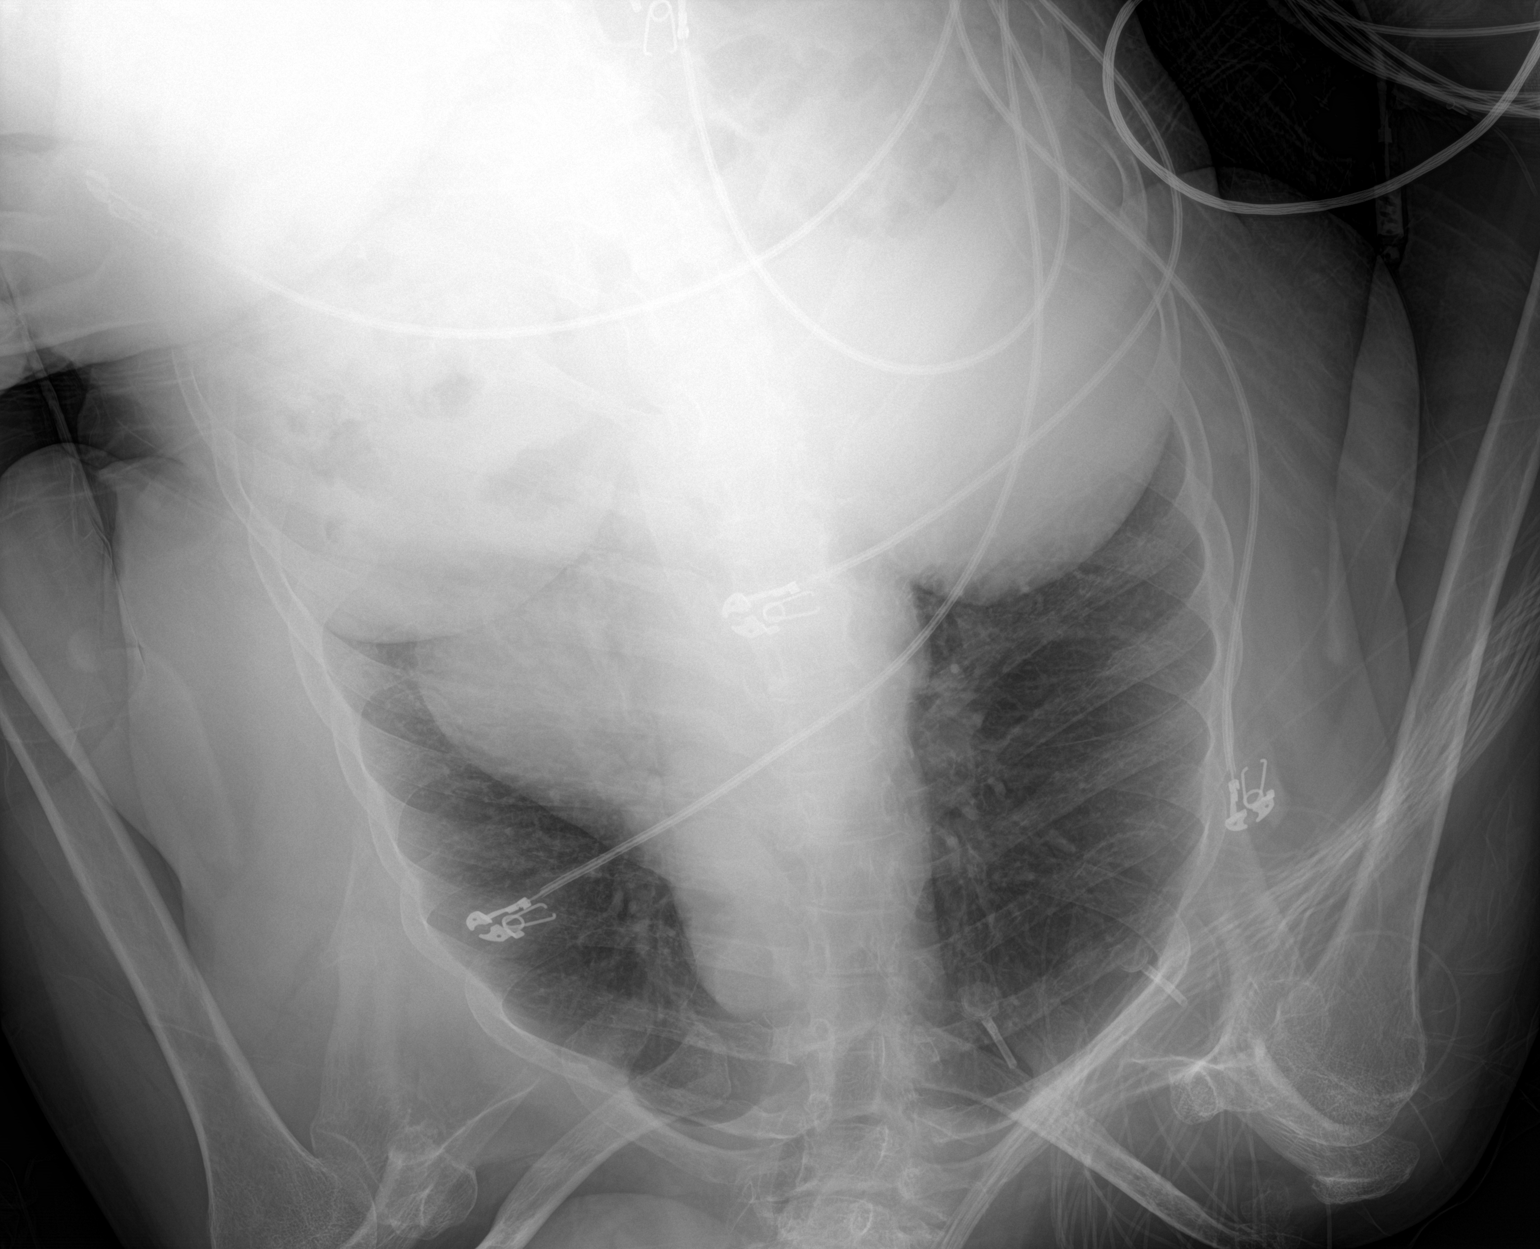

[1 of 1 positions shown; findings below may reference images not displayed]

FINDINGS: Heart is borderline in size. Lungs are clear. No effusions or acute
bony abnormality.
IMPRESSION: No active disease.

## 2020-08-23 IMAGING — CT CT CERVICAL SPINE W/O CM
3 series · 12 of 33 positions shown, 14 images · IV contrast (OMNI 350)
Comparison: [DATE]

CLINICAL DATA: Status post fall.

EXAM:
CT CERVICAL SPINE WITHOUT CONTRAST
TECHNIQUE: Multidetector CT imaging of the cervical spine was performed without
intravenous contrast. Multiplanar CT image reconstructions were also
generated.

[Series 14: cspine st · axial · 0.27mm/px · z∈[+761,+887]mm · 4 of 93 slices shown, 5 images]
[im 15/93  soft-tissue]
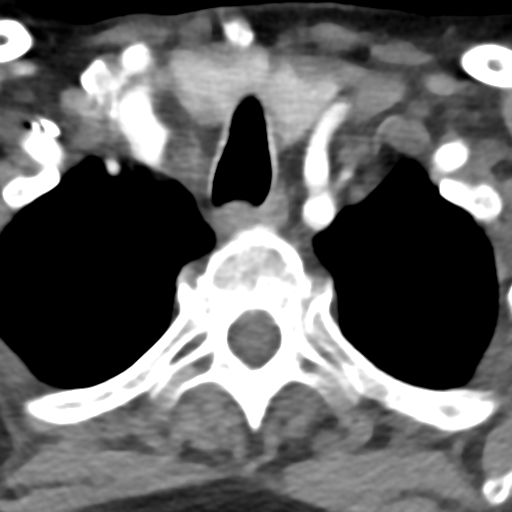
[im 15/93  bone]
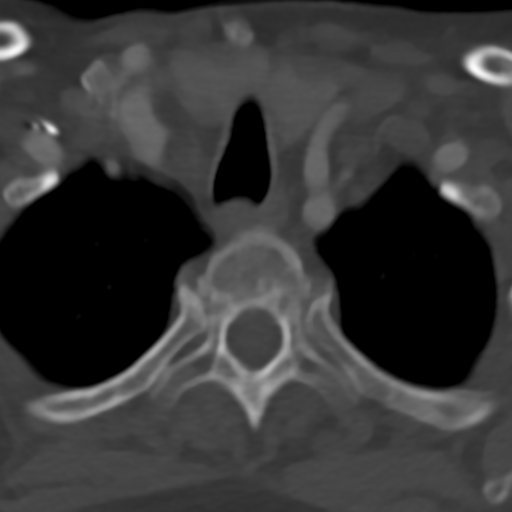
[im 36/93  bone]
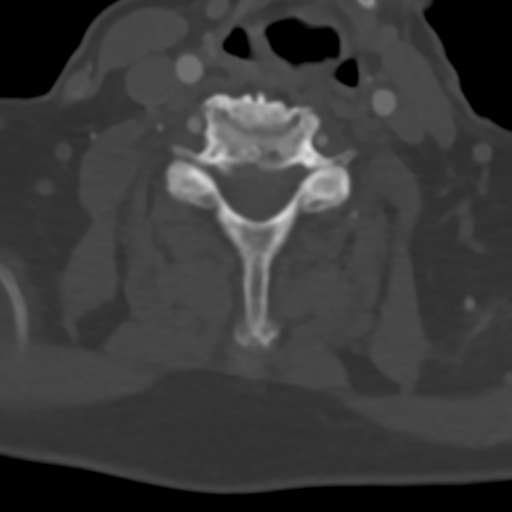
[im 57/93  bone]
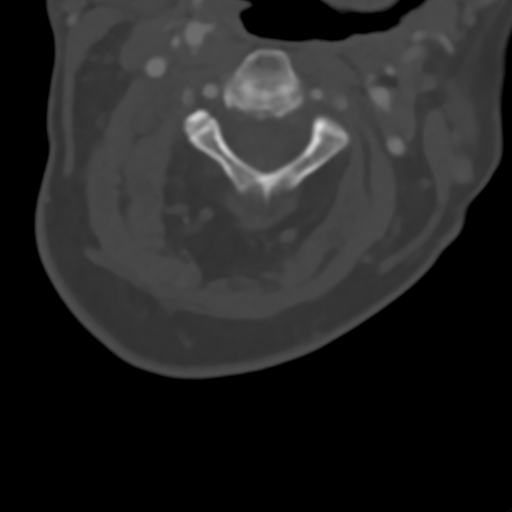
[im 78/93  bone]
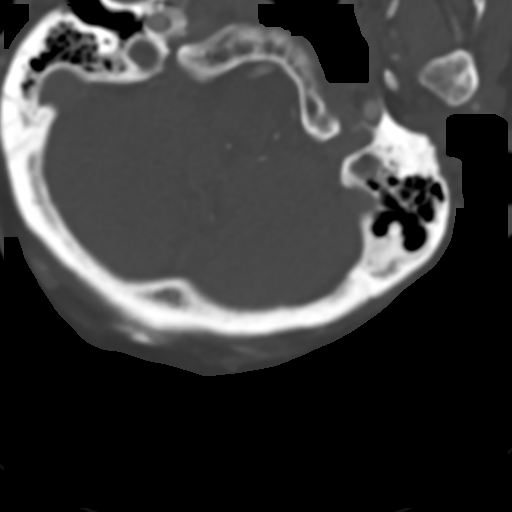

[Series 16: cspine bone cor · coronal · 0.23mm/px · 3 of 61 slices shown]
[im 13/61  bone]
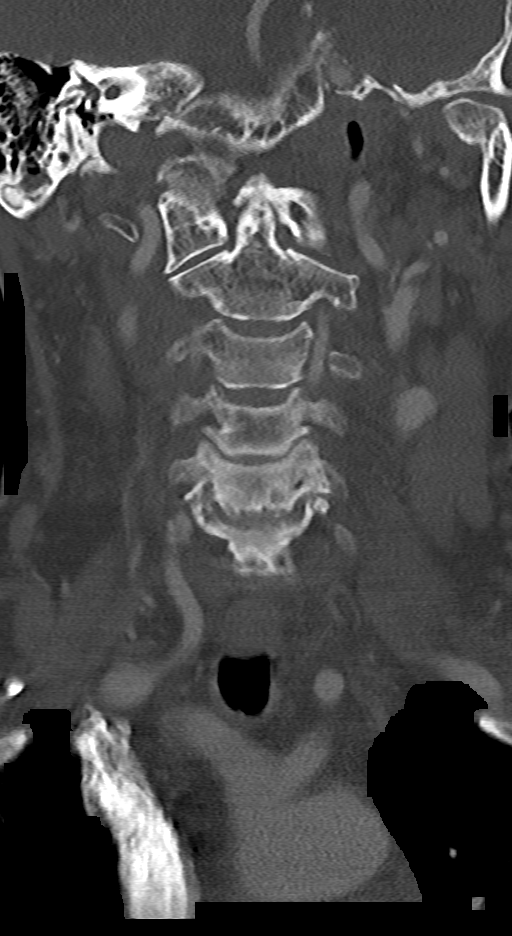
[im 25/61  bone]
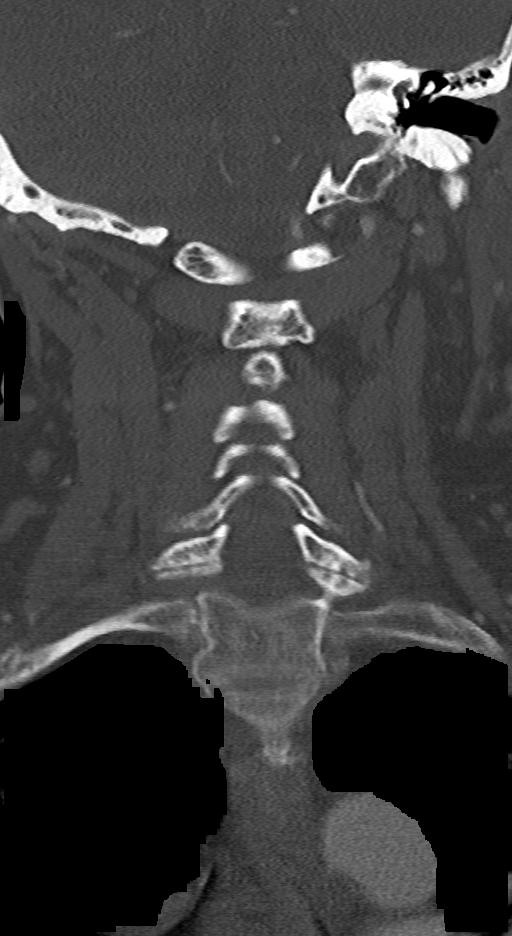
[im 37/61  bone]
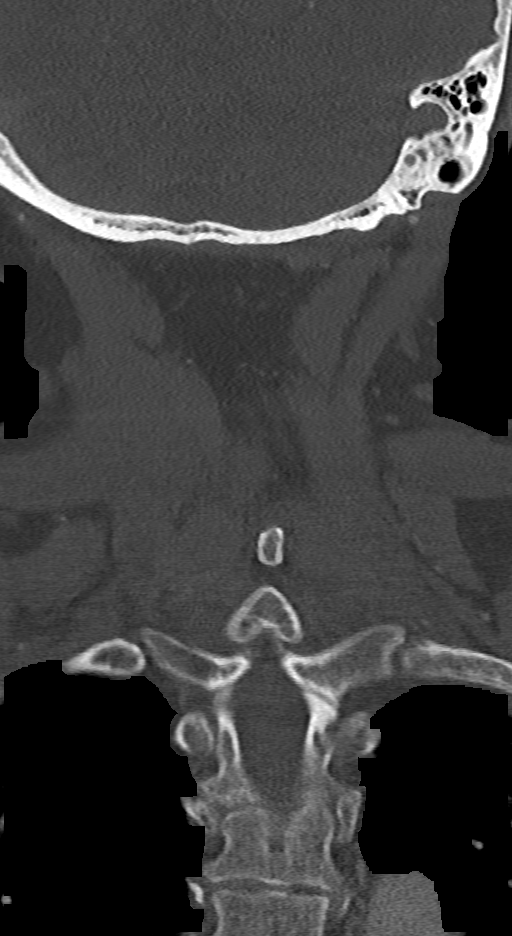

[Series 17: cspine bone sag · sagittal · 0.27mm/px · 5 of 56 slices shown, 6 images]
[im 19/56  bone]
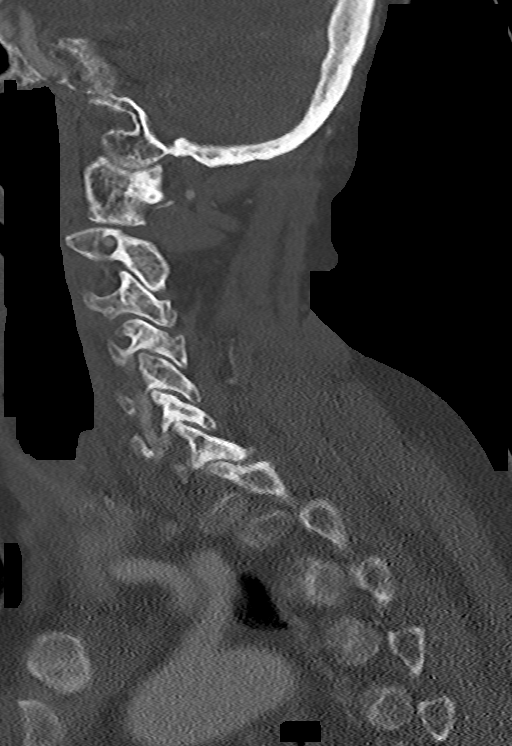
[im 23/56  bone]
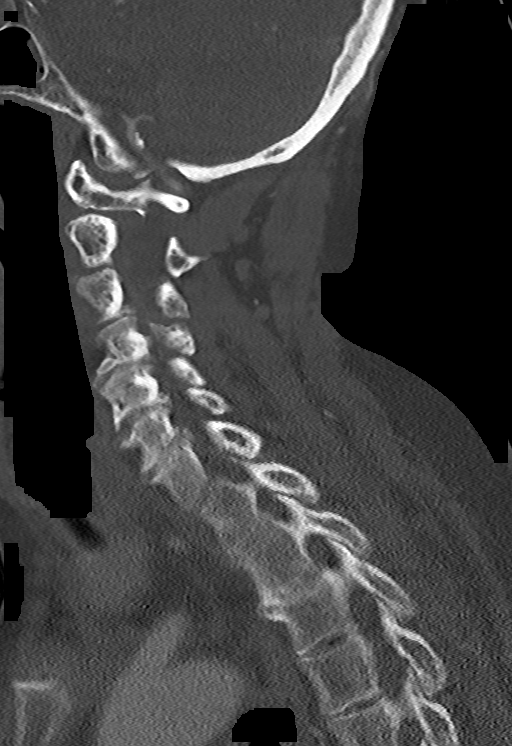
[im 28/56  soft-tissue]
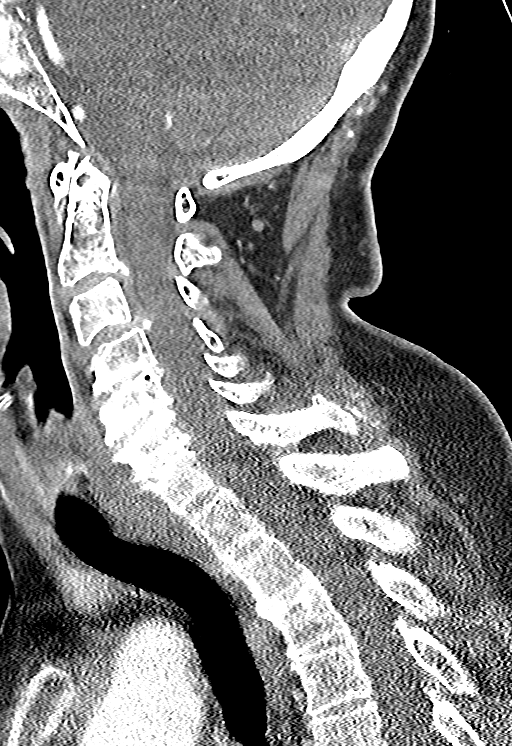
[im 28/56  bone]
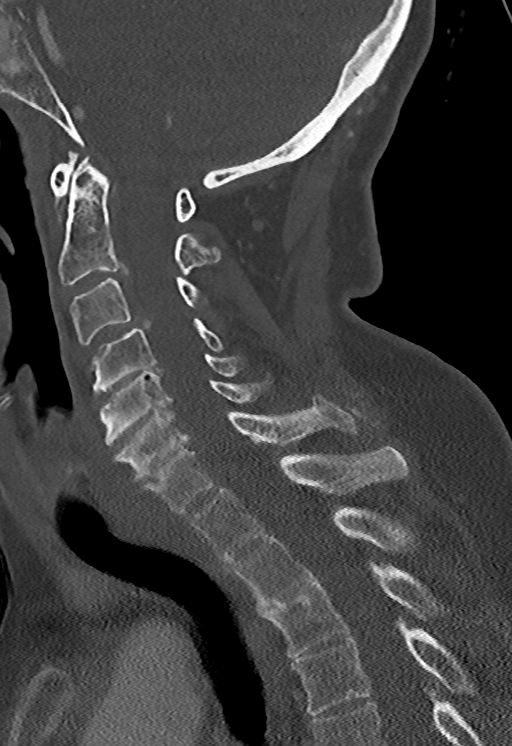
[im 33/56  bone]
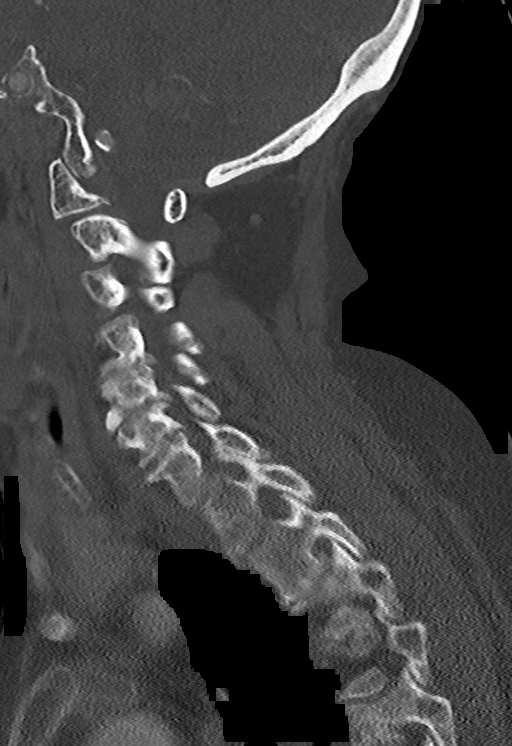
[im 37/56  bone]
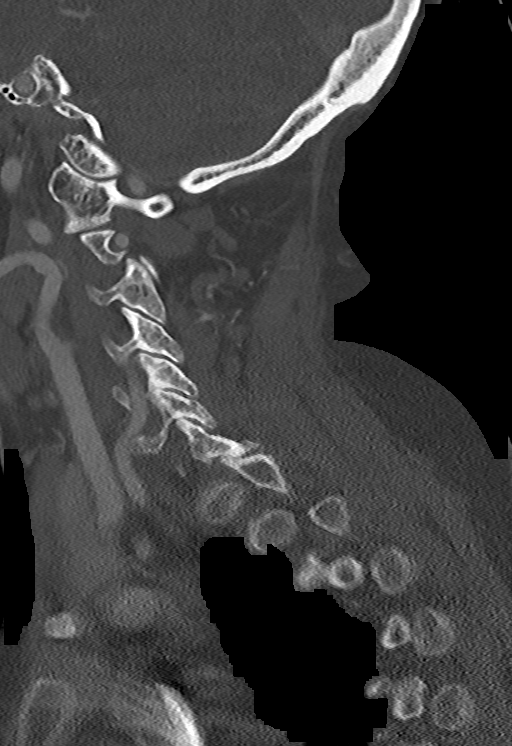

[12 of 33 positions shown; findings below may reference images not displayed]

FINDINGS: Alignment: Approximately 1 mm to 2 mm retrolisthesis of the C4
vertebral body is seen on C5 and C5 vertebral body on C6.

Skull base and vertebrae: No acute fracture. No primary bone lesion
or focal pathologic process.

Soft tissues and spinal canal: No prevertebral fluid or swelling. No
visible canal hematoma.

Disc levels: Marked severity endplate sclerosis and osteophyte
formation is seen at the levels of C4-C5, C5-C6 and C6-C7.

Marked severity intervertebral disc space narrowing is also seen at
the levels of C4-C5, C5-C6 and C6-C7.

Moderate to marked severity bilateral multilevel facet joint
hypertrophy is noted.

Upper chest: Negative.

Other: None.
IMPRESSION: 1. Marked severity multilevel degenerative changes, most prominent
at the levels of C4-C5, C5-C6 and C6-C7.
2. No acute cervical spine fracture.

## 2020-08-23 IMAGING — MR MR HEAD WO/W CM
9 of 12 series · 34 of 48 positions shown · IV contrast (gadavist)
Comparison: Head CT [DATE].

CLINICAL DATA: Stroke follow-up.

EXAM:
MRI HEAD WITHOUT AND WITH CONTRAST
TECHNIQUE: Multiplanar, multiecho pulse sequences of the brain and surrounding
structures were obtained without and with intravenous contrast.
CONTRAST:  6mL GADAVIST GADOBUTROL 1 MMOL/ML IV SOLN

[Series 3: DWI · axial · 3.0mm · 1.09mm/px · z∈[-67,+89]mm · 8 of 106 slices shown (1 of 4)]
[im 1/106]
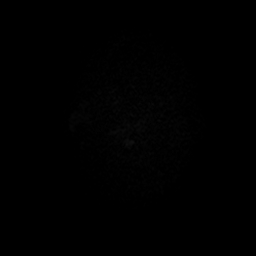
[im 12/106]
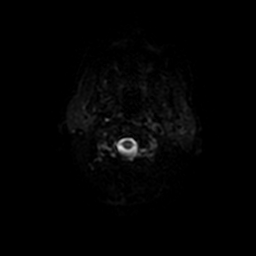
[im 36/106]
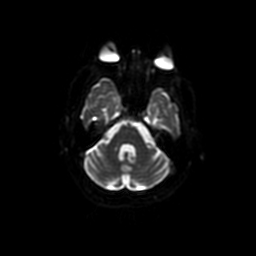
[im 47/106]
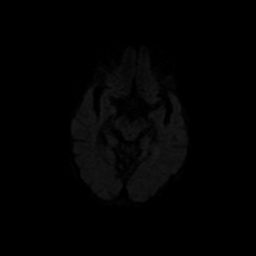
[im 59/106]
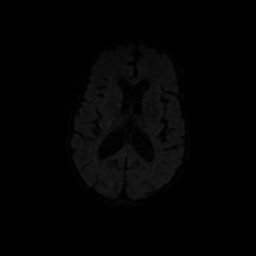
[im 71/106]
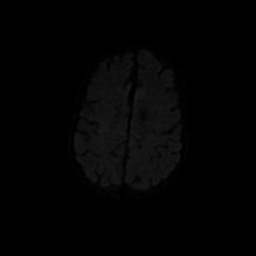
[im 94/106]
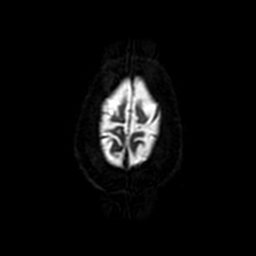
[im 106/106]
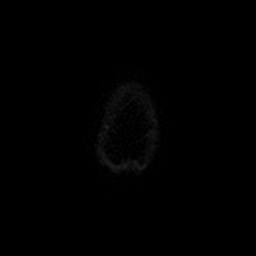

[Series 4: DWI · coronal · 5.0mm · 1.09mm/px · 7 of 80 slices shown (2 of 4)]
[im 1/80]
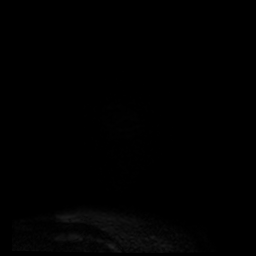
[im 14/80]
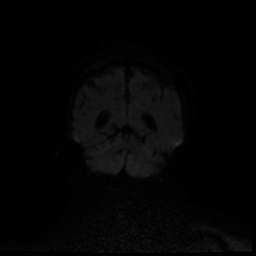
[im 27/80]
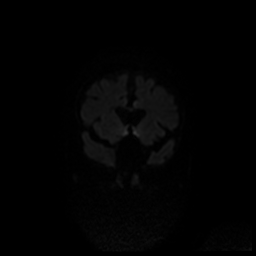
[im 40/80]
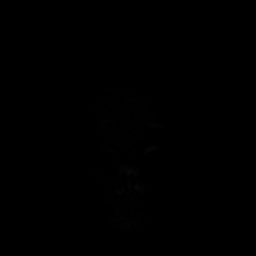
[im 53/80]
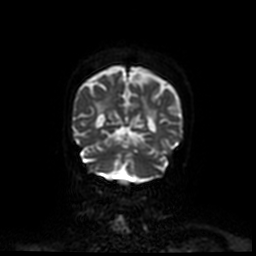
[im 66/80]
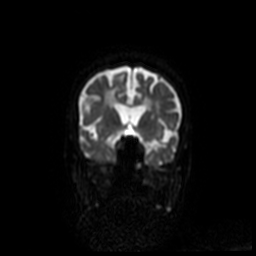
[im 80/80]
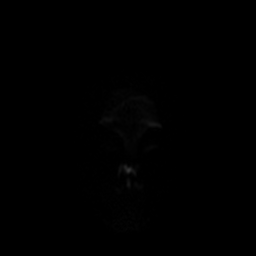

[Series 6: T2 · axial · 5.0mm · 0.43mm/px · z∈[-51,+86]mm · 2 of 24 slices shown (1 of 2)]
[im 1/24]
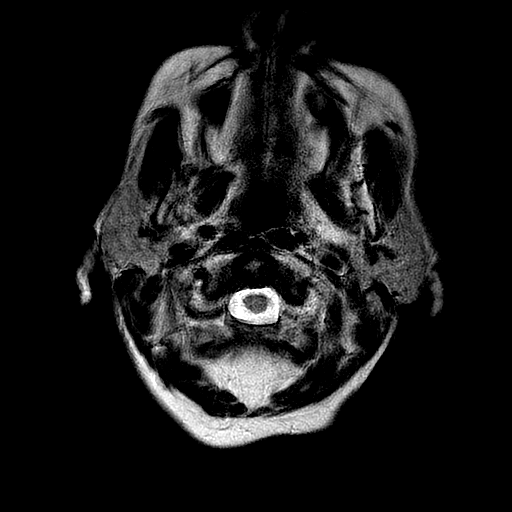
[im 24/24]
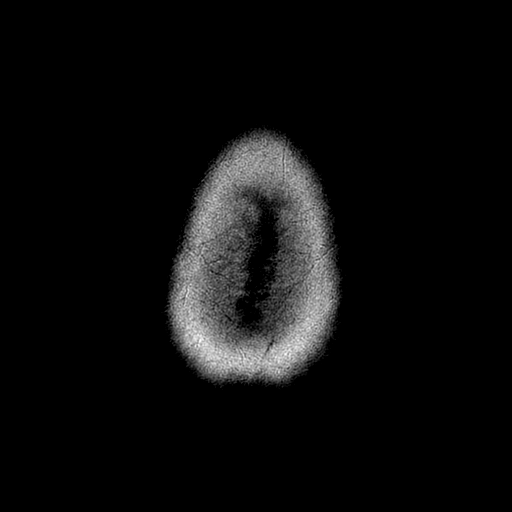

[Series 7: FLAIR · axial · 5.0mm · 0.43mm/px · z∈[-51,+86]mm · 2 of 24 slices shown]
[im 1/24]
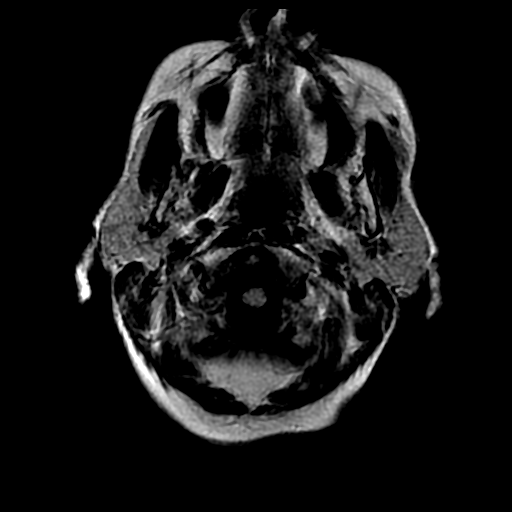
[im 24/24]
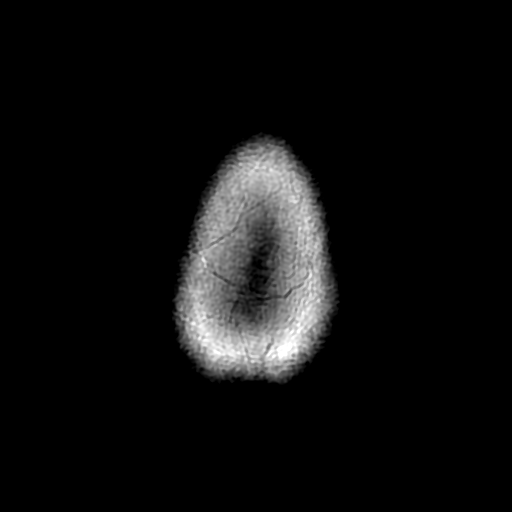

[Series 10: T2 · coronal · 5.0mm · 0.43mm/px · 2 of 30 slices shown (2 of 2)]
[im 1/30]
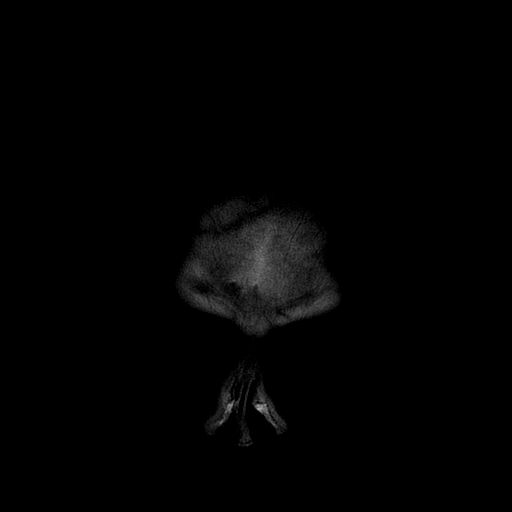
[im 30/30]
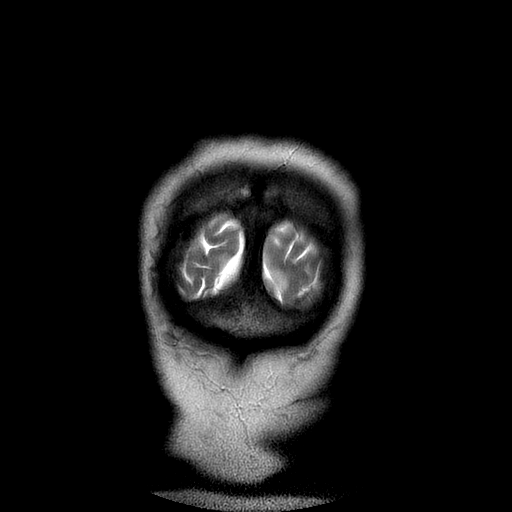

[Series 11: T1 post-contrast · axial · 3.0mm · 0.47mm/px · z∈[-55,+91]mm · 4 of 50 slices shown (1 of 2)]
[im 1/50]
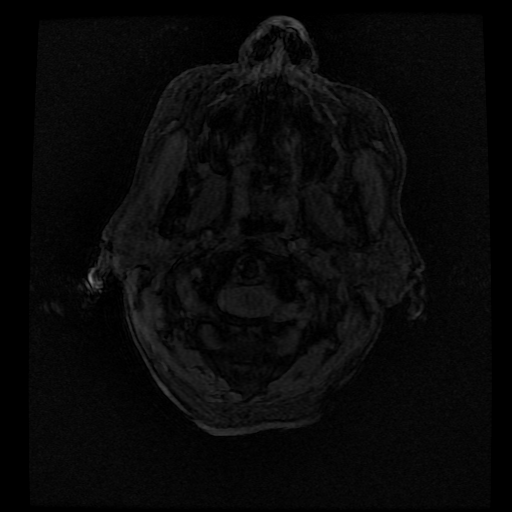
[im 17/50]
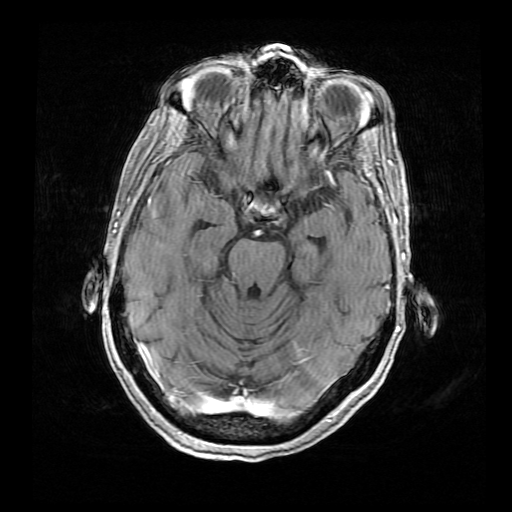
[im 33/50]
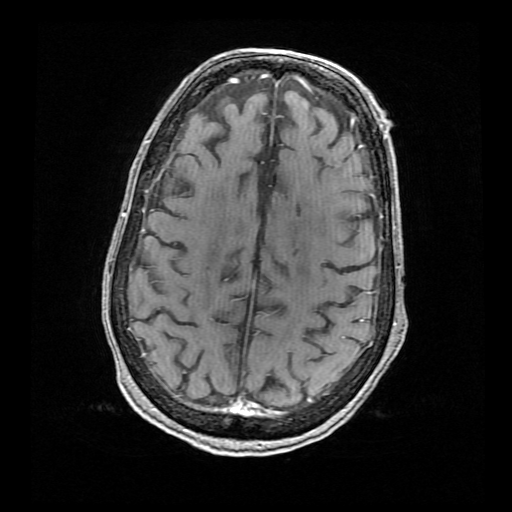
[im 50/50]
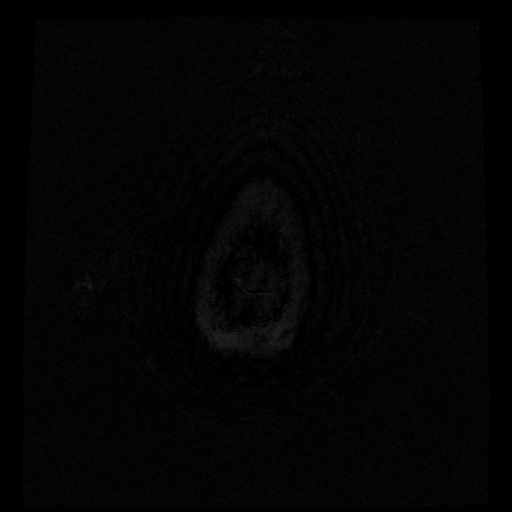

[Series 12: T1 post-contrast · coronal · 5.0mm · 0.43mm/px · 2 of 30 slices shown (2 of 2)]
[im 1/30]
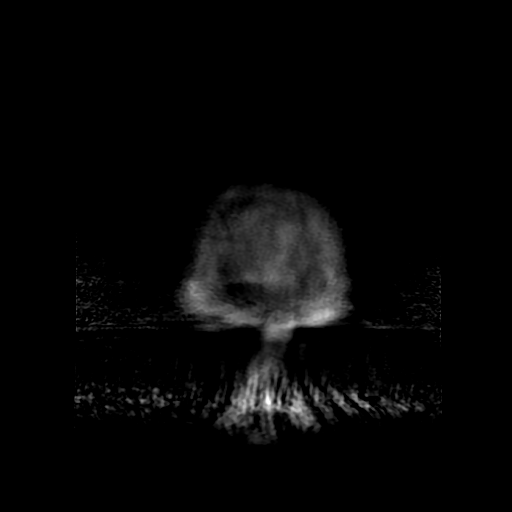
[im 30/30]
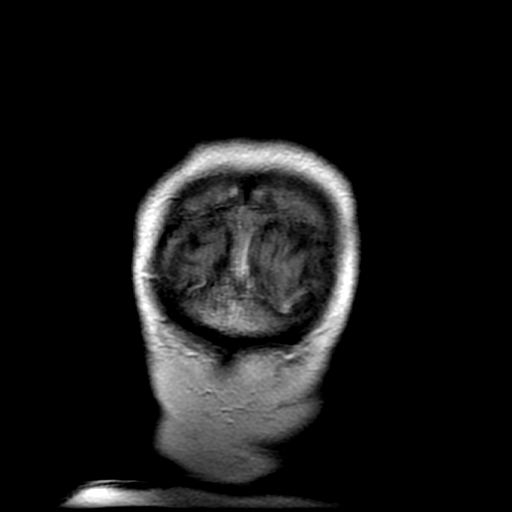

[Series 300: DWI · axial · 3.0mm · 1.09mm/px · z∈[-67,+89]mm · 4 of 53 slices shown (3 of 4)]
[im 1/53]
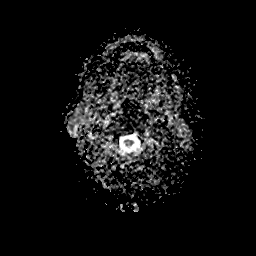
[im 18/53]
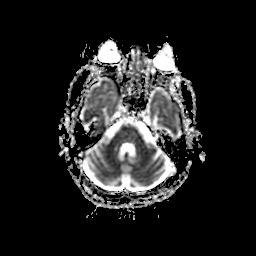
[im 35/53]
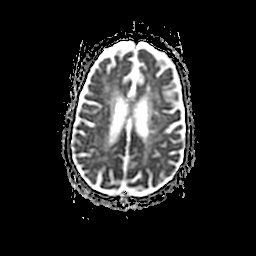
[im 53/53]
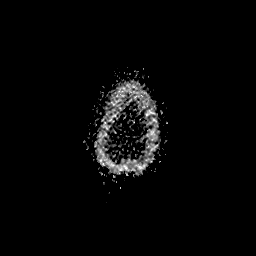

[Series 400: DWI · coronal · 5.0mm · 1.09mm/px · 3 of 40 slices shown (4 of 4)]
[im 1/40]
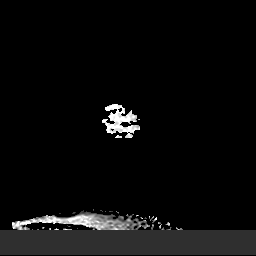
[im 20/40]
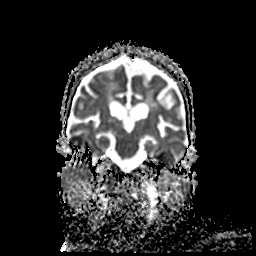
[im 40/40]
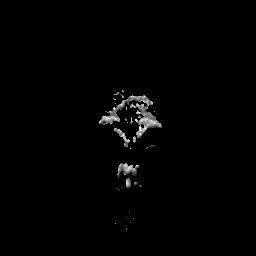

[34 of 48 positions shown; findings below may reference images not displayed]

FINDINGS: Brain: No acute infarction, hemorrhage, hydrocephalus, extra-axial
collection or mass lesion. Remote lacunar infarct in the bilateral
centrum semiovale, corona radiata and right thalamus. Scattered
confluent foci of T2 hyperintensity are seen within the white matter
of the cerebral hemispheres, nonspecific. Mild parenchymal volume
loss. No focus of abnormal contrast enhancement.

Vascular: Normal flow voids.

Skull and upper cervical spine: Normal marrow signal.

Sinuses/Orbits: Negative.

Other: Small right mastoid effusion.
IMPRESSION: 1. No acute intracranial abnormality.
2. Remote lacunar infarct in the bilateral centrum semiovale, corona
radiata and right thalamus.
3. Advanced chronic microvascular ischemic changes. Mild parenchymal
volume loss.
4. Small right mastoid effusion.

## 2020-08-23 MED ORDER — SODIUM CHLORIDE 0.9 % IV SOLN: 2000.0000 mg | Freq: Once | INTRAVENOUS | Status: DC

## 2020-08-23 MED ORDER — ASPIRIN 300 MG RE SUPP: 300.0000 mg | Freq: Every day | RECTAL | Status: DC

## 2020-08-23 MED ORDER — LEVETIRACETAM IN NACL 1000 MG/100ML IV SOLN
1000.0000 mg | Freq: Once | INTRAVENOUS | Status: AC
  Administered 2020-08-23: 1000 mg via INTRAVENOUS
  Filled 2020-08-23: qty 100

## 2020-08-23 MED ORDER — SODIUM CHLORIDE 0.9 % IV SOLN
75.0000 mL/h | INTRAVENOUS | Status: DC
  Administered 2020-08-23 – 2020-08-26 (×3): 75 mL/h via INTRAVENOUS

## 2020-08-23 MED ORDER — LORAZEPAM 2 MG/ML IJ SOLN
1.0000 mg | INTRAMUSCULAR | Status: DC | PRN
  Administered 2020-08-24: 2 mg via INTRAVENOUS
  Filled 2020-08-23: qty 1

## 2020-08-23 MED ORDER — LORAZEPAM 2 MG/ML IJ SOLN
INTRAMUSCULAR | Status: AC
  Administered 2020-08-23: 2 mg via INTRAVENOUS
  Filled 2020-08-23: qty 1

## 2020-08-23 MED ORDER — THIAMINE HCL 100 MG/ML IJ SOLN
100.0000 mg | Freq: Every day | INTRAMUSCULAR | Status: DC
  Administered 2020-08-23 – 2020-08-29 (×6): 100 mg via INTRAVENOUS
  Filled 2020-08-23 (×6): qty 2

## 2020-08-23 MED ORDER — SODIUM CHLORIDE 0.9 % IV SOLN
200.0000 mg | Freq: Two times a day (BID) | INTRAVENOUS | Status: DC
  Administered 2020-08-23 – 2020-08-30 (×14): 200 mg via INTRAVENOUS
  Filled 2020-08-23 (×17): qty 20

## 2020-08-23 MED ORDER — SODIUM CHLORIDE 0.9 % IV SOLN
200.0000 mg | Freq: Once | INTRAVENOUS | Status: AC
  Administered 2020-08-23: 200 mg via INTRAVENOUS
  Filled 2020-08-23: qty 20

## 2020-08-23 MED ORDER — FOLIC ACID 1 MG PO TABS
1.0000 mg | ORAL_TABLET | Freq: Every day | ORAL | Status: DC
  Administered 2020-08-24 – 2020-08-31 (×4): 1 mg via ORAL
  Filled 2020-08-23 (×8): qty 1

## 2020-08-23 MED ORDER — LEVETIRACETAM IN NACL 1500 MG/100ML IV SOLN
1500.0000 mg | Freq: Two times a day (BID) | INTRAVENOUS | Status: DC
  Administered 2020-08-23 – 2020-08-30 (×14): 1500 mg via INTRAVENOUS
  Filled 2020-08-23 (×14): qty 100

## 2020-08-23 MED ORDER — HYDRALAZINE HCL 20 MG/ML IJ SOLN: 10.0000 mg | INTRAMUSCULAR | Status: DC | PRN

## 2020-08-23 MED ORDER — ADULT MULTIVITAMIN W/MINERALS CH
1.0000 | ORAL_TABLET | Freq: Every day | ORAL | Status: DC
  Administered 2020-08-24 – 2020-08-31 (×6): 1 via ORAL
  Filled 2020-08-23 (×8): qty 1

## 2020-08-23 MED ORDER — ENOXAPARIN SODIUM 40 MG/0.4ML ~~LOC~~ SOLN
40.0000 mg | SUBCUTANEOUS | Status: DC
  Administered 2020-08-23 – 2020-08-31 (×9): 40 mg via SUBCUTANEOUS
  Filled 2020-08-23 (×9): qty 0.4

## 2020-08-23 MED ORDER — LORAZEPAM 2 MG/ML IJ SOLN: 2.0000 mg | Freq: Once | INTRAMUSCULAR | Status: AC

## 2020-08-23 MED ORDER — IOHEXOL 350 MG/ML SOLN
100.0000 mL | Freq: Once | INTRAVENOUS | Status: AC | PRN
  Administered 2020-08-23: 100 mL via INTRAVENOUS

## 2020-08-23 MED ORDER — PANTOPRAZOLE SODIUM 40 MG IV SOLR
40.0000 mg | INTRAVENOUS | Status: DC
  Administered 2020-08-23 – 2020-08-29 (×6): 40 mg via INTRAVENOUS
  Filled 2020-08-23 (×6): qty 40

## 2020-08-23 MED ORDER — ASPIRIN 300 MG RE SUPP
300.0000 mg | Freq: Once | RECTAL | Status: AC
  Administered 2020-08-23: 300 mg via RECTAL
  Filled 2020-08-23: qty 1

## 2020-08-23 MED ORDER — THIAMINE HCL 100 MG PO TABS
100.0000 mg | ORAL_TABLET | Freq: Every day | ORAL | Status: DC
  Administered 2020-08-26 – 2020-08-31 (×5): 100 mg via ORAL
  Filled 2020-08-23 (×6): qty 1

## 2020-08-23 MED ORDER — LORAZEPAM 2 MG/ML IJ SOLN
1.0000 mg | INTRAMUSCULAR | Status: AC | PRN
Start: 2020-08-23 — End: 2020-08-26
  Administered 2020-08-24: 2 mg via INTRAVENOUS
  Administered 2020-08-24 – 2020-08-25 (×4): 4 mg via INTRAVENOUS
  Administered 2020-08-25: 2 mg via INTRAVENOUS
  Administered 2020-08-26: 4 mg via INTRAVENOUS
  Filled 2020-08-23: qty 1
  Filled 2020-08-23 (×4): qty 2
  Filled 2020-08-23: qty 1
  Filled 2020-08-23: qty 2

## 2020-08-23 MED ORDER — SODIUM CHLORIDE 0.9 % IV SOLN: INTRAVENOUS | Status: DC | PRN

## 2020-08-23 MED ORDER — LORAZEPAM 1 MG PO TABS
1.0000 mg | ORAL_TABLET | ORAL | Status: AC | PRN
  Administered 2020-08-24: 3 mg via ORAL
  Administered 2020-08-26: 4 mg via ORAL
  Filled 2020-08-23: qty 3
  Filled 2020-08-23: qty 4

## 2020-08-23 MED ORDER — GADOBUTROL 1 MMOL/ML IV SOLN
6.0000 mL | Freq: Once | INTRAVENOUS | Status: AC | PRN
  Administered 2020-08-23: 6 mL via INTRAVENOUS

## 2020-08-23 NOTE — Consult Note (Deleted)
[facility] Tele[facility] Consult Services   Date of Service:   08/23/2020 12:17:58  Diagnosis:     .  I63.9 - Cerebrovascular accident (CVA), unspecified mechanism (HCC)  Impression:     . 60 yo female with history of HTN, smoking who presents with complaints of left sided numbness and weakness observed to have multiple focal seizures of L face/arm occurring every 3 minutes lasting 1 minute each responsive to ativan. Reported [id number]. Despite transient benefit w 2 mg ativan, patient with continues to have dense left sided deficits including neglect, NIH 7. Can not exclude stroke. For this reason patient is a TPA candidate. No exclusion criteria found, inclusion/exclusion criteria discussed with patient and husband at bedside. Risk and benefits of IV TPA discussed with patient and husband. Agreed to plan of care. CTH w/o shows no acute findings. IV TPA given at 1250. Seizure activity continues despite multiple doses of Ativan (currently 3 mg total). Activity self arrests approx after 1 min however given continuation likely in status. Keppra 2 gram IV load dose ordered. Advise to repeat 2 mg IV Ativan q 5 min prn, patient may require intubation to secure airway.   Advise CTA head and neck to ensure no LVO however recommend to abate seizure activity first. Post tPA vital signs and neuro checks per guidelines.        Keep BP < 180/105, Keep Glucose < 180. Angioedema, bleeding, seizure, aspiration, fall precautions. NPO until cleared by speech. Hold antithrombotics until repeat CT negative.        Hold any anticoagulation until cleared by Neurology. Repeat CT brain - 24 hours post IV tPA. MRI Brain w/o , TTE with bubble study, HgAIC, Lipid panel - target LDL < 70, Rehab. Neuro to follow.         Metrics: Last Known Well: 08/23/2020 09:30:33 [facility] Notification Time: 08/23/2020 12:17:58 Arrival Time: 08/23/2020 12:16:47 Stamp Time: 08/23/2020 12:17:58 Initial  Response Time: 08/23/2020 12:19:47 Symptoms: left sided numbness and weakness. Marland Kitchen NIHSS Start Assessment Time: 08/23/2020 12:22:28 Patient is a candidate for Thrombolytic. Thrombolytic Medical Decision: 08/23/2020 12:31:41 Needle Time: 08/23/2020 12:50:06 Weight Noted by Staff: 64 kg  CT head showed no acute hemorrhage or acute core infarct.  ED Physician notified of diagnostic impression and management plan on 08/23/2020 12:55:20  Advanced Imaging: Advanced Imaging Not Recommended because:  CTA CTP is recommended however seizures need to be controlled first. Please reach out if any abnormality noted. Would be thrombectomy candidate if LVO.   Thrombolytic Contraindications:  Last Known Well > 4.5 hours: No CT Head showing hemorrhage: No Ischemic stroke within 3 months: No Severe head trauma within 3 months: No Intracranial/intraspinal surgery within 3 months: No History of intracranial hemorrhage: No Symptoms and signs consistent with an SAH: No GI malignancy or GI bleed within 21 days: No Coagulopathy: Platelets <100 000 /mm3, INR >1.7, aPTT>40 s, or PT >15 s: No Treatment dose of LMWH within the previous 24 hrs: No Use of NOACs in past 48 hours: No Glycoprotein IIb/IIIa receptor inhibitors use: No Symptoms consistent with infective endocarditis: No Suspected aortic arch dissection: No Intra-axial intracranial neoplasm: No  Thrombolytic Decision and Management Plan: Management with thrombolytic treatment was explained to the Patient and Family as was risks and benefits and alternatives to the treatment. Patient agrees with the decision to proceed with thrombolytic treatment. . All questions were answered and the Patient and Family expressed understanding of the treatment plan.  Our recommendations are outlined below.  Recommendations: IV Alteplase  recommended.  Thrombolytic bolus given Without Complication.  IV Alteplase/Activase Total Dose - 57.6 mg IV  Alteplase/Activase Bolus Dose - 5.8 mg IV Alteplase/Activase Infusion Dose - 51.8 mg   Routine post Thrombolytic monitoring including neuro checks and blood pressure control during/after treatment Monitor blood pressure Check blood pressure and neuro assessment every 15 min for 2 h, then every 30 min for 6 h, and finally every hour for 16 h.  Manage Blood Pressure per post Thrombolytic protocol.      .  Admission to ICU     .  CT brain 24 hours post Thrombolytic     .  NPO until swallowing screen performed and passed     .  No antiplatelet agents or anticoagulants (including heparin for DVT prophylaxis) in first 24 hours     .  No Foley catheter, nasogastric tube, arterial catheter or central venous catheter for 24 hr, unless absolutely necessary     .  Telemetry     .  Bedside swallow evaluation     .  HOB less than 30 degrees     .  Euglycemia     .  Avoid hyperthermia, PRN acetaminophen     .  DVT prophylaxis     .  Inpatient Neurology Consultation     .  Stroke evaluation as per inpatient neurology recommendations  Additional Recommendations:     Marland Kitchen  MRI Head Without Contrast     .  CTA Head and Neck     .  Lipid Panel     .  Check Hgb A1c     .  Dysphagia Screen     .  DVT Prophylaxis     .  PT/ OT / Speech Therapy Consultation     .  Neurology to Be [facility] for Inpatient Routine Consultation     .  EEG to be Done Routinely     .  Echocardiogram, TTE  Discussed with ED physician    ------------------------------------------------------------------------------  History of Present Illness: Patient is a 60 year old Female.  Patient was brought by EMS for symptoms of left sided numbness and weakness. .  60 yo female with history of HTN, smoker who presents with left sided numbness and weakness. Patient reports at 0930 this am she developed sudden left sided numbness followed by left sided numbness and weakness with left sided jerking motion. On exam patient having  rhythmic jerking activity in both left upper or lower extremities concerns for focal seizure. Current frequency 1-2 hz with epochs of 60 second seizure fb R sided gaze preference and slight decreased responsiveness. Seizures occur every 3 minutes approximately cw status. 2mg  Ativan given at 1240 .Patient denies ever having an episode like this before. After receiving Ativan jerking activity calms with persistent weakness of left side. Husband at bedside. Risk and benefits of TPA discussed with patient and husband. No contraindications found. Patients husband agreeable to plan on care. Patient continue with focal seizure activity additional Ativan of 1mg  given 1252.  Last seen normal was within 4.5 hours. There is no history of hemorrhagic complications or intracranial hemorrhage. There is no history of Recent Anticoagulants. There is no history of recent major surgery. There is no history of recent stroke.  Past Medical History:     . Hypertension  Social History: Smoking: Yes  Family History:Unable to obtain due to Patient Status  Review of System:  14 Points Review of Systems was performed and was negative except mentioned  in HPI.  Anticoagulant use:  No  Antiplatelet use: No  Allergies:  Reviewed     Examination: BP(153/92), Pulse(88), Blood Glucose(128) 1A: Level of Consciousness - Alert; keenly responsive + 0 1B: Ask Month and Age - Both Questions Right + 0 1C: Blink Eyes & Squeeze Hands - Performs Both Tasks + 0 2: Test Horizontal Extraocular Movements - Normal + 0 3: Test Visual Fields - No Visual Loss + 0 4: Test Facial Palsy (Use Grimace if Obtunded) - Minor paralysis (flat nasolabial fold, smile asymmetry) + 1 5A: Test Left Arm Motor Drift - Drift, hits bed + 2 5B: Test Right Arm Motor Drift - No Drift for 10 Seconds + 0 6A: Test Left Leg Motor Drift - Some Effort Against Gravity + 2 6B: Test Right Leg Motor Drift - No Drift for 5 Seconds + 0 7: Test Limb Ataxia  (FNF/Heel-Shin) - No Ataxia + 0 8: Test Sensation - Mild-Moderate Loss: Less Sharp/More Dull + 1 9: Test Language/Aphasia - Normal; No aphasia + 0 10: Test Dysarthria - Normal + 0 11: Test Extinction/Inattention - Extinction to bilateral simultaneous stimulation + 1  NIHSS Score: 7  Pre-Morbid Modified Rankin Scale: 0 Points = No symptoms at all   Patient/Family was informed the Neurology Consult would occur via TeleHealth consult by way of interactive audio and video telecommunications and consented to receiving care in this manner.   Patient is being evaluated for possible acute neurologic impairment and high probability of imminent or life-threatening deterioration. I spent total of 40 minutes providing care to this patient, including time for face to face visit via telemedicine, review of medical records, imaging studies and discussion of findings with providers, the patient and/or family.   Dr Lucianne Muss   [facility] [phone]  Case [id number]

## 2020-08-23 NOTE — ED Provider Notes (Signed)
60 yo female presented as code stroke- lkn last night.  Episodic confusion.  Husband had difficulty getting her of the floor.  Husband went to work today and when he came home at 1 she was weak with right facial weakness.   Here had some left eye deviation and shaking. Physical Exam  Pulse (!) 116   Resp (!) 23   Wt 56.6 kg   SpO2 100%   BMI 19.54 kg/m   Physical Exam Patient with eeg leads in place and eeg being obtained. Exam limited due to above She is moving lue but does not seem to move rue Intermittent eye opening with gaze to left No verbalization ED Course/Procedures   Clinical Course as of 08/23/20 1603  Thu Aug 23, 2020  1601 CO2(!): 21 Mil ddecrease co2  [DR]  1602 Creatinine(!): 1.21 [DR]  1602 Creatinine increased to 1.21 from 0.83- will hydrate Mild leukocytosis noted. [DR]    Clinical Course User Index [DR] Margarita Grizzle, MD    Procedures  MDM  Patient being seen here with comanagement with neuro They are getting eeg Patient with 2 of ativan Keppra and vimpat being given here. Husband at bedside now.  He reports no return to baseline since shaking episode began last night. MRI reviewed without evidence of acute stroke or bleeding. Labs reviewed. 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  She presents today with altered mental status with report of seizure since last night.  She has received Ativan, Vimpat, and Keppra here in ED. She is undergoing EEG monitoring with plans to have 24-hour monitor in place. Neurology has seen and evaluated and advises admission to stepdown unit Review of work-up reveals mildly increased creatinine which is likely secondary to some prerenal insufficiency with no p.o. intake take given in the past 24 hours She also has a  history of daily alcohol use.  Alcohol is less than 10 and she has not been drinking during this interlude. Urinalysis and urine drug screen are still currently pending Patient care discussed with Dr. Katrinka Blazing- including Bernerd Limbo and concern for etoh withdrawal.      Margarita Grizzle, MD 08/23/20 [phone]

## 2020-08-23 NOTE — ED Notes (Signed)
Unable to perform a thorough neuro check due to patient condition pt wake up to external stimuli and pain however unable to follow commands at this time, continuous EEG on pt

## 2020-08-23 NOTE — Progress Notes (Addendum)
Pt transported to 3W a few leads required reapplication. [facility] monitored event button tested.

## 2020-08-23 NOTE — ED Notes (Signed)
Pt arrived to CT scanner. MD and neurologist at bedside

## 2020-08-23 NOTE — Procedures (Signed)
Stat EEG 20 minute  History: 60 yo F w possible seizure activity with left gaze deviation and shaking.   Technique: This is a technically satisfactory eighteen channel recording using the standard 10-20 system of electrode placement. The study is limited by significant artifact in the awake steate.    Report: The predominant background is predominantly asleep with moderate amplitude, symmetric, and rhythmic activity that is reactive to eye opening. Arousals are noted as diffuse artifact with increasing background frequency. Sleep architecture is seen better in the right hemisphere with spindles but asymmetry cannot be called persistent on this limited study.   Impression: This is, in context, a normal EEG that is predominantly in the asleep/drowsy state after receiving sedation (ativan). Although sleep architecture is seen better in the right hemisphere, the overall background between both hemispheres is similar with delta/theta range slowing in sleep and faster frequencies when awake (artifact limits quantification). There is no evidence of abnormal discharges or seizures. A normal EEG does not rule out the possibility of epilepsy.

## 2020-08-23 NOTE — ED Notes (Signed)
Pt transport to MRI for STAT imagine with RN per neurologist at bedside.

## 2020-08-23 NOTE — H&P (Addendum)
History and Physical    REEVE MALLO YSA:[id number] DOB: 17-Apr-1961 DOA: 08/23/2020  Referring MD/NP/PA: Margarita Grizzle, MD PCP: Avon Gully, MD  Patient coming from: Home via EMS  Chief Complaint: Altered mental status  I have personally briefly reviewed patient's old medical records in [facility]   HPI: CELEST REITZ is a 60 y.o. female with medical history significant of hypertension, hyperlipidemia, CVA, seizure with prior history of status epilepticus, and polysubstance abuse(alcohol and cocaine) who presents after being found to be acutely altered.  History is obtained from review of records as patient unable to give history at this time as she is altered.  The patient's husband called EMS after finding her acutely altered, unresponsive with urinary incontinence, and reports of shaking/jerking episodes lasting 5 to 10 minutes on the floor since last night around 9 PM.  After each episode patient was reported to be better, but still have slurred speech and was not able to get up.  Husband was apparently able to get her back in bed this morning, but when he came to check on her around lunch time she was in the bed with shaking and jerking any call EMS.  Upon EMS arrival patient was noted to initially be able to open eyes and communicate, but acutely had episode of not responding or talking anymore with right-sided facial droop.  Patient had been admitted last year to the hospital for status epilepticus found to have multiple infarcts in 07/2019 and subsequently in 01/2020 with seizures.  During both admissions patient was found to be positive for cocaine has a prior history of alcohol abuse.  ED Course: Upon admission to the emergency department patient was seen as a code stroke and evaluated by neurology.  CT scan of the head was negative for any acute abnormalities.  CTA of the head neck showed no large vessel occlusion.  She was reported to be afebrile with heart rates 85-122, respirations  16-37, blood pressure 139/94-148/92, and O2 saturation maintained on room air.  Labs significant for WBC 11.8, BUN 13, creatinine 1.21, AST 56, alcohol level less than 10.  Urinalysis and UDS pending.  Patient was given Ativan 2 mg IV, Keppra 2000 mg IV, Vimpat 200 mg IV, and aspirin suppository.  She was placed on continuous EEG monitoring.   Review of Systems  Unable to perform ROS: Mental status change    Past Medical History:  Diagnosis Date  . Abnormal LFTs    Hx of  . CVA (cerebral vascular accident) (HCC) 2005  . Hyperlipidemia   . Hypertension   . Polysubstance dependence including opioid type drug, episodic abuse (HCC)    cocaine/etoh     Past Surgical History:  Procedure Laterality Date  . ESOPHAGOGASTRODUODENOSCOPY N/A 08/16/2019   Procedure: ESOPHAGOGASTRODUODENOSCOPY (EGD);  Surgeon: Vida Rigger, MD;  Location: [facility] ENDOSCOPY;  Service: Endoscopy;  Laterality: N/A;     reports that she has never smoked. She has never used smokeless tobacco. She reports current alcohol use. She reports that she does not use drugs.  No Known Allergies  Family History  Problem Relation Age of Onset  . Cancer Father        prostate   . Hypertension Mother   . Asthma Mother   . Bronchitis Mother     Prior to Admission medications   Medication Sig Start Date End Date Taking? Authorizing Provider  amLODipine (NORVASC) 10 MG tablet TAKE 1 TABLET BY MOUTH EVERY DAY (NEEDS TO MAKE AN APPOINTMENT) Patient  taking differently: Take 10 mg by mouth daily. (NEEDS TO MAKE AN APPOINTMENT) 02/17/20   Johnson, Clanford L, MD  atorvastatin (LIPITOR) 10 MG tablet Take 10 mg by mouth daily. 08/06/20   [provider]  folic acid (FOLVITE) 1 MG tablet Take 1 tablet (1 mg total) by mouth daily. 02/18/20   Johnson, Clanford L, MD  lacosamide (VIMPAT) 200 MG TABS tablet Take 1 tablet (200 mg total) by mouth 2 (two) times daily. 02/17/20   Johnson, Clanford L, MD  levETIRAcetam (KEPPRA) 750 MG tablet  Take 2 tablets (1,500 mg total) by mouth 2 (two) times daily. 02/17/20 04/17/20  Johnson, Clanford L, MD  metoprolol tartrate (LOPRESSOR) 25 MG tablet Take 1 tablet (25 mg total) by mouth 2 (two) times daily. 02/17/20   Johnson, Clanford L, MD  Multiple Vitamin (MULTIVITAMIN WITH MINERALS) TABS tablet Take 1 tablet by mouth daily. 02/18/20   Johnson, Clanford L, MD  simvastatin (ZOCOR) 20 MG tablet Take 1 tablet (20 mg total) by mouth daily at 6 PM. 02/17/20   Johnson, Clanford L, MD  thiamine 100 MG tablet Take 1 tablet (100 mg total) by mouth daily. 02/18/20   Cleora FleetJohnson, Clanford L, MD    Physical Exam:  Constitutional: Lethargic unable to arouse but we will withdraw to noxious stimuli Vitals:   08/23/20 1408 08/23/20 1409 08/23/20 1428 08/23/20 1515  BP:   (!) 148/92 (!) 139/94  Pulse: (!) 115 (!) 116 (!) 102 (!) 112  Resp: 17 (!) 23 (!) 24 (!) 23  SpO2: 94% 100% 98% 97%  Weight:       Eyes: Pinpoint pupils that are not very reactive to light with leftward gaze ENMT: Mucous membranes are dry. Posterior pharynx clear of any exudate or lesions.  Neck: normal, supple, no masses, no thyromegaly Respiratory: clear to auscultation bilaterally, no wheezing, no crackles. Normal respiratory effort. No accessory muscle use.  Cardiovascular: Tachycardia, no murmurs / rubs / gallops. No extremity edema. 2+ pedal pulses. No carotid bruits.  Abdomen: no tenderness, no masses palpated. No hepatosplenomegaly. Bowel sounds positive.  Musculoskeletal: no clubbing / cyanosis. No joint deformity upper and lower extremities. Good ROM, no contractures. Normal muscle tone.  Skin: no rashes, lesions, ulcers. No induration Neurologic: Withdraws to noxious stimuli Psychiatric: Unable to assess as patient is not really response    Labs on Admission: I have personally reviewed following labs and imaging studies  CBC: Recent Labs  Lab 08/23/20 1330 08/23/20 1335  WBC 11.8*  --   NEUTROABS 9.2*  --   HGB 13.7  14.3  HCT 40.6 42.0  MCV 94.6  --   PLT 382  --    Basic Metabolic Panel: Recent Labs  Lab 08/23/20 1330 08/23/20 1335  NA 135 136  K 4.2 4.2  CL 105 104  CO2 21*  --   GLUCOSE 127* 127*  BUN 13 17  CREATININE 1.21* 1.10*  CALCIUM 9.4  --    GFR: CrCl cannot be calculated (Unknown ideal weight.). Liver Function Tests: Recent Labs  Lab 08/23/20 1330  AST 56*  ALT 18  ALKPHOS 73  BILITOT 1.0  PROT 7.5  ALBUMIN 3.7   No results for input(s): LIPASE, AMYLASE in the last 168 hours. No results for input(s): AMMONIA in the last 168 hours. Coagulation Profile: Recent Labs  Lab 08/23/20 1330  INR 1.0   Cardiac Enzymes: No results for input(s): CKTOTAL, CKMB, CKMBINDEX, TROPONINI in the last 168 hours. BNP (last 3 results) No results  for input(s): PROBNP in the last 8760 hours. HbA1C: No results for input(s): HGBA1C in the last 72 hours. CBG: Recent Labs  Lab 08/23/20 1329  GLUCAP 126*   Lipid Profile: No results for input(s): CHOL, HDL, LDLCALC, TRIG, CHOLHDL, LDLDIRECT in the last 72 hours. Thyroid Function Tests: No results for input(s): TSH, T4TOTAL, FREET4, T3FREE, THYROIDAB in the last 72 hours. Anemia Panel: No results for input(s): VITAMINB12, FOLATE, FERRITIN, TIBC, IRON, RETICCTPCT in the last 72 hours. Urine analysis:    Component Value Date/Time   COLORURINE YELLOW 02/09/2020 2111   APPEARANCEUR CLEAR 02/09/2020 2111   LABSPEC 1.013 02/09/2020 2111   PHURINE 7.0 02/09/2020 2111   GLUCOSEU NEGATIVE 02/09/2020 2111   HGBUR SMALL (A) 02/09/2020 2111   BILIRUBINUR NEGATIVE 02/09/2020 2111   KETONESUR NEGATIVE 02/09/2020 2111   PROTEINUR 100 (A) 02/09/2020 2111   NITRITE NEGATIVE 02/09/2020 2111   LEUKOCYTESUR NEGATIVE 02/09/2020 2111   Sepsis Labs: No results found for this or any previous visit (from the past 240 hour(s)).   Radiological Exams on Admission: MR BRAIN W WO CONTRAST  Result Date: 08/23/2020 CLINICAL DATA:  Stroke follow-up.  EXAM: MRI HEAD WITHOUT AND WITH CONTRAST TECHNIQUE: Multiplanar, multiecho pulse sequences of the brain and surrounding structures were obtained without and with intravenous contrast. CONTRAST:  77mL GADAVIST GADOBUTROL 1 MMOL/ML IV SOLN COMPARISON:  Head CT August 23, 2020. FINDINGS: Brain: No acute infarction, hemorrhage, hydrocephalus, extra-axial collection or mass lesion. Remote lacunar infarct in the bilateral centrum semiovale, corona radiata and right thalamus. Scattered confluent foci of T2 hyperintensity are seen within the white matter of the cerebral hemispheres, nonspecific. Mild parenchymal volume loss. No focus of abnormal contrast enhancement. Vascular: Normal flow voids. Skull and upper cervical spine: Normal marrow signal. Sinuses/Orbits: Negative. Other: Small right mastoid effusion. IMPRESSION: 1. No acute intracranial abnormality. 2. Remote lacunar infarct in the bilateral centrum semiovale, corona radiata and right thalamus. 3. Advanced chronic microvascular ischemic changes. Mild parenchymal volume loss. 4. Small right mastoid effusion. Electronically Signed   By: Baldemar Lenis M.D.   On: 08/23/2020 15:18   CT HEAD CODE STROKE WO CONTRAST  Result Date: 08/23/2020 CLINICAL DATA:  Code stroke.  Right-sided weakness EXAM: CT HEAD WITHOUT CONTRAST TECHNIQUE: Contiguous axial images were obtained from the base of the skull through the vertex without intravenous contrast. COMPARISON:  02/09/2020 FINDINGS: Brain: No acute intracranial hemorrhage, mass effect, or edema. No definite new loss of gray-white differentiation. A small area hypoattenuation along the anterior left insula appears to be through an area of artifact. There are chronic infarcts of the right putamen and thalamus. Additional patchy and confluent areas of hypoattenuation in the supratentorial white matter are nonspecific but probably reflect similar moderate chronic microvascular ischemic changes. Prominence of the  ventricles and sulci reflects similar parenchymal volume loss. Vascular: No hyperdense vessel. There is intracranial atherosclerotic calcification at the skull base. Skull: Unremarkable. Sinuses/Orbits: No acute abnormality. Other: Mastoid air cells are clear. ASPECTS (Alberta Stroke Program Early CT Score) - Ganglionic level infarction (caudate, lentiform nuclei, internal capsule, insula, M1-M3 cortex): 7 - Supraganglionic infarction (M4-M6 cortex): 3 Total score (0-10 with 10 being normal): 10 IMPRESSION: There is no acute intracranial hemorrhage or evidence of acute infarction. ASPECT score is 10. These results were communicated to Dr. Roda Shutters at 1:45 pm on 08/23/2020 by text page via the [facility] messaging system. Electronically Signed   By: Guadlupe Spanish M.D.   On: 08/23/2020 13:47   CT ANGIO HEAD NECK  W WO CM W PERF (CODE STROKE)  Result Date: 08/23/2020 CLINICAL DATA:  Right-sided weakness EXAM: CT HEAD WITHOUT CONTRAST CT ANGIOGRAPHY HEAD AND NECK CT PERFUSION BRAIN TECHNIQUE: Multidetector CT imaging of the head and neck was performed using the standard protocol during bolus administration of intravenous contrast. Multiplanar CT image reconstructions and MIPs were obtained to evaluate the vascular anatomy. Carotid stenosis measurements (when applicable) are obtained utilizing NASCET criteria, using the distal internal carotid diameter as the denominator. Multiphase CT imaging of the brain was performed following IV bolus contrast injection. Subsequent parametric perfusion maps were calculated using RAPID software. CONTRAST:  OMNIPAQUE IOHEXOL 350 MG/ML SOLN COMPARISON:  None. FINDINGS: CTA NECK FINDINGS Aortic arch: Great vessel origins are patent. Right carotid system: Patent. Mild noncalcified plaque at the ICA origin causing minimal stenosis. Left carotid system: Patent. Mild mixed plaque at the ICA origin without stenosis. Vertebral arteries: Patent. Right vertebral artery is dominant. No stenosis.  Skeleton: Degenerative changes of the cervical spine. Other neck: Negative. Upper chest: No apical lung mass. Review of the MIP images confirms the above findings CTA HEAD FINDINGS Anterior circulation: Intracranial internal carotid arteries are patent with mild calcified plaque. Anterior and middle cerebral arteries are patent. Mild stenosis of the mid left M1 MCA. Posterior circulation: Intracranial vertebral arteries are patent with mild calcified plaque. Basilar artery is patent. Major cerebellar artery origins are patent. Posterior cerebral arteries are patent. A right posterior communicating artery is present. Venous sinuses: As permitted by contrast timing, patent. Review of the MIP images confirms the above findings CT Brain Perfusion Findings: CBF (<30%) Volume: 0mL Perfusion (Tmax>6.0s) volume: 8mL Mismatch Volume: 8mL, likely artifactual. Infarction Location:  None. IMPRESSION: No large vessel occlusion or hemodynamically significant stenosis. Mild left mid M1 MCA stenosis. Perfusion imaging demonstrates no evidence of core infarction or penumbra. 8 mL of calculated penumbra in the bilateral anterior frontal lobes is likely artifactual. Electronically Signed   By: Guadlupe Spanish M.D.   On: 08/23/2020 14:09    EKG: Independently reviewed.  Sinus tachycardia 115 bpm   Assessment/Plan Acute metabolic encephalopathy secondary to suspected seizure with possible status epilepticus: Patient presents after being found to be acutely altered with intermittent shaking, urinary incontinence, and left gaze preference.  CTA of the head and neck negative for any large vessel occlusion.  MRI did not show any acute signs of a infarct.  Patient has been placed on continuous EEG monitoring, but no initial seizure activity appreciated.  Patient had been given Ativan 2 mg IV, Keppra 2 g IV, and Vimpat 200 mg IV . -Admit to a progressive bed -Seizure order set utilized -Seizure precautions -Neurochecks -Follow-up  urine drug screen -Check TSH, free T4, acetaminophen/salicylate level -Continuous EEG monitoring -Continue Vimpat 200 mg IV twice daily and Keppra 1500 mg IV twice daily per neurology recommendations -Ativan IV as needed seizure activity -Normal saline IVFs at 75 mL/h -Appreciate neurology consultative services, we will follow-up for further recommendations  SIRS: Acute.  Patient presents with tachycardia and WBC elevated 11.8. -Check lactic acid level -Check chest x-ray -Follow-up urinalysis  Acute kidney injury: Patient presents with creatinine 1.210 with BUN 13.  Patient baseline creatinine previously noted to be around 0.7-0.8. -Check CK -IV fluids as seen above -May need to adjust rate of IV fluids if concern for rhabdomyolysis  Essential hypertension: Blood pressures currently stable Home blood pressure medications include amlodipine 10 mg daily. -Restart home medications when patient able to tolerate p.o. -Hydralazine IV as needed  for elevated blood pressures greater than 180  Polysubstance abuse: Reportedly has a long history of alcohol use as well as cocaine.  Initial alcohol level undetectable.  Question if seizure activity provoked from alcohol withdrawal. -Follow-up UDS -CIWA protocols with Ativan IV as needed  History of CVA: MRI did not show any signs of an acute stroke, but did note prior strokes. Patient was placed on aspirin suppository.  Home medications included Plavix and statin. -Continue aspirin suppository daily for now  Elevated AST: AST 56.  Suspect secondary to patient reported history of alcohol abuse. -Continue to monitor  Hyperlipidemia: Home medications include atorvastatin 10 mg daily. -Restart statin when able  GERD -Change Protonix to IV DVT prophylaxis: lovenox Code Status: Full Family Communication: Unable to reach husband by phone at this time Disposition Plan: To be determined Consults called: Neurology Admission status: Inpatient likely  require more to than 2 midnight stay patient is acutely altered  Clydie Braun MD [facility]   If 7PM-7AM, please contact night-coverage   08/23/2020, 4:10 PM

## 2020-08-23 NOTE — Progress Notes (Signed)
LTM EEG hooked up and running - no initial skin breakdown - push button tested - neuro notified.  

## 2020-08-23 NOTE — ED Notes (Signed)
Pt transported back to room from MRI. EEG at bedside. Care handoff given to next RN

## 2020-08-23 NOTE — ED Triage Notes (Addendum)
Pt BIB [facility] c/o Stroke like symptoms including: right side weakness, eight side facial droop, with contracted right arm, not following commands, and minimal responsiveness generalized trembling.  HX polysubstance abuse and seizures

## 2020-08-23 NOTE — Consult Note (Signed)
Stroke Neurology Consultation Note  Consult Requested by: Dr. Lockie Molauratolo  Reason for Consult: code stroke  Consult Date: 08/23/20   The history was obtained from the husband and EMS.  During history and examination, all items were able to obtain unless otherwise noted.  History of Present Illness:  Cindy [name] is a 60 y.o. African American female with PMH of stroke, seizure, status epilepticus, HTN, HLD, polysubstance abuses presented to ED for code stroke.   Per EMS, pt husband called this afternoon that pt has had AMS, not responding, urinary incontinence and shaking jerking since last night. They arrived at 1pm, initially pt eyes open and able to answer questions and communicate but had acute episode of not responding, not talking anymore, right facial droop. Pt was lying in bed, arm and leg strength was not checked at that time. Code stroke called. En route, pt had urinary incontinence again. On arrival to ED, pt somnolence, not answer questions, nonverbal, able to open eyes on voice. Seems to have left gaze preference and right arm and leg weaker than left, but also intermittent small amplitude shaking on the bilateral UEs and right LE. Received ativan 2mg  and loaded with keppra and vimpat. CT no acute abnormality and CTA head and neck showed b/l V4 stenosis but no LVO.   Called her husband but number was not working number, called pt brother on the phone got corrected number for husband [place]. [facility], per him, pt had AMS, shaking jerking, urinary incontinence last night 9pam, apparently she was on the ground and husband not able to get her to bed. She stayed on the floor all night. She had several episode like that each lasting 5-10310min and then she was better but still slurry speech, not able to get up, not able to walk. Until this am, husband was able to get pt back to bed. Then husband went to work, around lunch time husband back home found pt in bed again had  shaking jerking episode. He called EMS. Husband stated that pt suppose to take medication morning and evening but he does not know if pt missed medication.   Per note, pt had AMS and seizure and weakness [facility] found to have multifocal punctate infarcts bilateral hemispheres. EEG no seizure. CUS unremarkable. EF 60-65%. LDL 46 and A1C 5.1. cocaine positive. She was put on keppra, but ASA and plavix were on hold due ot upper GIB.   She was admitted on 01/2020 for AMS and convulsive seizure. Cocaine positive again. EEG showed epileptogenicity and cortical dysfunction arising from left hemisphere, maximal left posterior quadrant suggestive of underlying structural abnormality. This pattern is on the ictal-interictal continuum with higher potential for seizure recurrence.  She was diagnosed with status epilepticus, treated with keppra and vimpat and continued on discharge.   LSN: 9pm last night tPA Given: No: outside window, likely seizure mRS = 0 per husband  Past Medical History:  Diagnosis Date  . Abnormal LFTs    Hx of  . CVA (cerebral vascular accident) (HCC) 2005  . Hyperlipidemia   . Hypertension   . Polysubstance dependence including opioid type drug, episodic abuse (HCC)    cocaine/etoh     Past Surgical History:  Procedure Laterality Date  . ESOPHAGOGASTRODUODENOSCOPY N/A 08/16/2019   Procedure: ESOPHAGOGASTRODUODENOSCOPY (EGD);  Surgeon: Vida RiggerMagod, Marc, MD;  [facility];  Service: Endoscopy;  Laterality: N/A;    Family History  Problem Relation Age of Onset  .  Cancer Father        prostate   . Hypertension Mother   . Asthma Mother   . Bronchitis Mother     Social History:  reports that she has never smoked. She has never used smokeless tobacco. She reports current alcohol use. She reports that she does not use drugs.  Allergies: No Known Allergies  No current facility-administered medications on file prior to encounter.   Current Outpatient  Medications on File Prior to Encounter  Medication Sig Dispense Refill  . amLODipine (NORVASC) 10 MG tablet TAKE 1 TABLET BY MOUTH EVERY DAY (NEEDS TO MAKE AN APPOINTMENT) 30 tablet 1  . folic acid (FOLVITE) 1 MG tablet Take 1 tablet (1 mg total) by mouth daily. 30 tablet 1  . lacosamide (VIMPAT) 200 MG TABS tablet Take 1 tablet (200 mg total) by mouth 2 (two) times daily. 60 tablet 1  . levETIRAcetam (KEPPRA) 750 MG tablet Take 2 tablets (1,500 mg total) by mouth 2 (two) times daily. 120 tablet 1  . metoprolol tartrate (LOPRESSOR) 25 MG tablet Take 1 tablet (25 mg total) by mouth 2 (two) times daily. 60 tablet 1  . Multiple Vitamin (MULTIVITAMIN WITH MINERALS) TABS tablet Take 1 tablet by mouth daily.    . simvastatin (ZOCOR) 20 MG tablet Take 1 tablet (20 mg total) by mouth daily at 6 PM. 30 tablet 1  . thiamine 100 MG tablet Take 1 tablet (100 mg total) by mouth daily. 30 tablet 1    Review of Systems: A full ROS was attempted today and was not able to be performed.    Physical Examination: Pulse Rate:  [85-122] 116 (04/07 1409) Resp:  [16-37] 23 (04/07 1409) SpO2:  [94 %-100 %] 100 % (04/07 1409)  General - well nourished, well developed, somnolence.    Ophthalmologic - fundi not visualized due to noncooperation.    Cardiovascular - regular rhythm and rate  Neuro - somnolence, eyes briefly open on voice, non verbal, able to mourn after CT test, but still not meaning for words.  Not following all simple commands. Eyes initially midline but became more left gaze preference in the CT suite, not blinking to visual threat bilaterally, no facial droop seen. However, right arm and leg 3+/5 on pain, left arm and leg brisk movement with pain and at least 4+/5 LUE and 4/5 LLE. Intermittent small amplitude shaking at BUEs and RLE. Sensation, coordination not cooperative and gait not tested.  NIH Stroke Scale  Level Of Consciousness 0=Alert; keenly responsive 1=Arouse to minor  stimulation 2=Requires repeated stimulation to arouse or movements to pain 3=postures or unresponsive 2  LOC Questions to Month and Age 34=Answers both questions correctly 1=Answers one question correctly or dysarthria/intubated/trauma/language barrier 2=Answers neither question correctly or aphasia 2  LOC Commands      -Open/Close eyes     -Open/close grip     -Pantomime commands if communication barrier 0=Performs both tasks correctly 1=Performs one task correctly 2=Performs neighter task correctly 2  Best Gaze     -Only assess horizontal gaze 0=Normal 1=Partial gaze palsy 2=Forced deviation, or total gaze paresis 1  Visual 0=No visual loss 1=Partial hemianopia 2=Complete hemianopia 3=Bilateral hemianopia (blind including cortical blindness) 3  Facial Palsy     -Use grimace if obtunded 0=Normal symmetrical movement 1=Minor paralysis (asymmetry) 2=Partial paralysis (lower face) 3=Complete paralysis (upper and lower face) 0  Motor  0=No drift for 10/5 seconds 1=Drift, but does not hit bed 2=Some antigravity effort, hits  bed 3=No  effort against gravity, limb falls 4=No movement 0=Amputation/joint fusion Right Arm 2     Leg 2    Left Arm 0     Leg 1  Limb Ataxia     - FNT/HTS 0=Absent or does not understand or paralyzed or amputation/joint fusion 1=Present in one limb 2=Present in two limbs 0  Sensory 0=Normal 1=Mild to moderate sensory loss 2=Severe to total sensory loss or coma/unresponsive 0  Best Language 0=No aphasia, normal 1=Mild to moderate aphasia 2=Severe aphasia 3=Mute, global aphasia, or coma/unresponsive 2  Dysarthria 0=Normal 1=Mild to moderate 2=Severe, unintelligible or mute/anarthric 0=intubated/unable to test 2  Extinction/Neglect 0=No abnormality 1=visual/tactile/auditory/spatia/personal inattention/Extinction to bilateral simultaneous stimulation 2=Profound neglect/extinction more than 1 modality  2  Total   21      Data Reviewed: CT HEAD  CODE STROKE WO CONTRAST  Result Date: 08/23/2020 CLINICAL DATA:  Code stroke.  Right-sided weakness EXAM: CT HEAD WITHOUT CONTRAST TECHNIQUE: Contiguous axial images were obtained from the base of the skull through the vertex without intravenous contrast. COMPARISON:  02/09/2020 FINDINGS: Brain: No acute intracranial hemorrhage, mass effect, or edema. No definite new loss of gray-white differentiation. A small area hypoattenuation along the anterior left insula appears to be through an area of artifact. There are chronic infarcts of the right putamen and thalamus. Additional patchy and confluent areas of hypoattenuation in the supratentorial white matter are nonspecific but probably reflect similar moderate chronic microvascular ischemic changes. Prominence of the ventricles and sulci reflects similar parenchymal volume loss. Vascular: No hyperdense vessel. There is intracranial atherosclerotic calcification at the skull base. Skull: Unremarkable. Sinuses/Orbits: No acute abnormality. Other: Mastoid air cells are clear. ASPECTS ([place] Stroke Program Early CT Score) - Ganglionic level infarction (caudate, lentiform nuclei, internal capsule, insula, M1-M3 cortex): 7 - Supraganglionic infarction (M4-M6 cortex): 3 Total score (0-10 with 10 being normal): 10 IMPRESSION: There is no acute intracranial hemorrhage or evidence of acute infarction. ASPECT score is 10. These results were communicated to Dr. Roda Shutters at 1:45 pm on 08/23/2020 by text page via the [facility] messaging system. Electronically Signed   By: Guadlupe Spanish M.D.   On: 08/23/2020 13:47   CT ANGIO HEAD NECK W WO CM W PERF (CODE STROKE)  Result Date: 08/23/2020 CLINICAL DATA:  Right-sided weakness EXAM: CT HEAD WITHOUT CONTRAST CT ANGIOGRAPHY HEAD AND NECK CT PERFUSION BRAIN TECHNIQUE: Multidetector CT imaging of the head and neck was performed using the standard protocol during bolus administration of intravenous contrast. Multiplanar CT image reconstructions  and MIPs were obtained to evaluate the vascular anatomy. Carotid stenosis measurements (when applicable) are obtained utilizing NASCET criteria, using the distal internal carotid diameter as the denominator. Multiphase CT imaging of the brain was performed following IV bolus contrast injection. Subsequent parametric perfusion maps were calculated using RAPID software. CONTRAST:  OMNIPAQUE IOHEXOL 350 MG/ML SOLN COMPARISON:  None. FINDINGS: CTA NECK FINDINGS Aortic arch: Great vessel origins are patent. Right carotid system: Patent. Mild noncalcified plaque at the ICA origin causing minimal stenosis. Left carotid system: Patent. Mild mixed plaque at the ICA origin without stenosis. Vertebral arteries: Patent. Right vertebral artery is dominant. No stenosis. Skeleton: Degenerative changes of the cervical spine. Other neck: Negative. Upper chest: No apical lung mass. Review of the MIP images confirms the above findings CTA HEAD FINDINGS Anterior circulation: Intracranial internal carotid arteries are patent with mild calcified plaque. Anterior and middle cerebral arteries are patent. Mild stenosis of the mid left M1 MCA. Posterior circulation: Intracranial vertebral arteries are patent  with mild calcified plaque. Basilar artery is patent. Major cerebellar artery origins are patent. Posterior cerebral arteries are patent. A right posterior communicating artery is present. Venous sinuses: As permitted by contrast timing, patent. Review of the MIP images confirms the above findings CT Brain Perfusion Findings: CBF (<30%) Volume: 38mL Perfusion (Tmax>6.0s) volume: 60mL Mismatch Volume: 64mL, likely artifactual. Infarction Location:  None. IMPRESSION: No large vessel occlusion or hemodynamically significant stenosis. Mild left mid M1 MCA stenosis. Perfusion imaging demonstrates no evidence of core infarction or penumbra. 8 mL of calculated penumbra in the bilateral anterior frontal lobes is likely artifactual.  Electronically Signed   By: Guadlupe Spanish M.D.   On: 08/23/2020 14:09    Assessment: 60 y.o. female stroke, seizure, status epilepticus, HTN, HLD, polysubstance abuses presented to ED for code stroke. After obtaining hx from EMS and later from husband, it seems that pt started to have AMS, shaking jerking, urinary incontinence since last night 9pm. Symptoms wax and wane but not back to normal per husband. In ER, pt continue to have intermittent shaking BUEs and RLE, somnolence, urinary incontinence, left gaze preference and right sided weakness. NIHSS = 21. CT no acute finding. CTA head and neck no LVO but b/l V4 stenosis. Pt has stroke and seizure 07/2019 and status epilepticus 01/2020. On keppra and vimpat at home but doubt for compliance. Pt not tPA candidate due to outside window and likely not stroke but seizure. Not IR candidate due to no LVO.  Pt presentation more likely seizure with status epilepticus. Received ativan 2mg , and loaded with keppra 2g and vimpat 200mg . Will do stat MRI with and without as well as stat LTM EEG. ASA 300 PR stat.    Plan: - Recommend admission to step down with hospitalist service - Continue further work up with stat MRI brain with and without - Stat LTM EEG - Frequent neuro checks - Telemetry monitoring - UDS, UA, CBC, CMP, ammonia  - S/p ativan 2mg , keppra 2g and vimpat 200mg  load - Continue home keppra 1500mg  bid and vimpat 200mg  bid - GI and DVT prophylaxis  - ASA PR 300mg  daily - General neurology team will follow up with you  - Discussed with Dr. ED physician  Thank you for this consultation and allowing to participate in the care of this patient.  , MD PhD Stroke Neurology 08/23/2020 3:15 PM  This patient is critically ill due to code stroke, status epilepticus, hx of stroke and seizure and at significant risk of neurological worsening, death form recurrent stroke, status epilepticus. This patient's care requires constant  monitoring of vital signs, hemodynamics, respiratory and cardiac monitoring, review of multiple databases, neurological assessment, discussion with family, other specialists and medical decision making of high complexity. I spent 100 minutes of neurocritical care time in the care of this patient. I had long discussion with husband over the phone, updated pt current condition, treatment plan and potential prognosis, and answered all the questions. He expressed understanding and appreciation.

## 2020-08-23 NOTE — Code Documentation (Signed)
Stroke Response Nurse Documentation Code Stroke Documentation Cindy [name]  is a 60 y.o.  y.o. female  arriving to [facility] ED via [facility] on 08/23/2020 with PMH of CVA, HTN, HLD, Seizures, Polysubstance. Code stroke was activated by EMS, husband called d/t AMS and patient reportedly became unresponsive while EMS was with her. Patient from home where she was LKW at 1300 and now has R weakness/droop & dysarthria, per patient's husband she has been having "episodes" intermittently since yesterday. Patient taking clopidogrel 75 mg daily PTA. Stroke team at the bedside on patient arrival, labs drawn and patient cleared for CT by Dr. Lockie Mola. Patient taken to CT with team, patient seen to have generalized shaking while on the CT table, V/O for Ativan- given while in CT. NIHSS 19, see documentation for details and code stroke times. Patient with decreased LOC, disoriented and not following commands, intermittent forced gaze left and upwards observed as well with nystagmus. Patient not speaking with the exception of "Damn" when noxious stimuli applied. The following imaging was completed:  CT, CTA head and neck, CTP. Patient is not a candidate for tPA as of right now, not believed to be a stroke per Dr. Roda Shutters. Care/Plan: STAT MRI and LTM EEG. Bedside handoff with ED RN Cindy [name].     1455: MRI reviewed by Dr. Roda Shutters, V/O to cancel code stroke. Dr. Roda Shutters was able to speak to husband on phone, states that patient's symptoms began day prior and became so bad that she fell and was unable to get up, per husband she stayed on floor overnight.   [name]  Stroke Response RN [phone]

## 2020-08-23 NOTE — ED Provider Notes (Addendum)
[facility] EMERGENCY DEPARTMENT Provider Note   CSN: [id number] Arrival date & time: 08/23/20  1327  An emergency department physician performed an initial assessment on this suspected stroke patient at 1328.  History Chief Complaint  Patient presents with  . Code Stroke    Cindy [name] is a 60 y.o. female.  Level 5 caveat due to altered mental status.  History of stroke, seizures, polysubstance abuse.  Has had possibly some seizure episodes since yesterday.  Patient husband called due to altered mental status.  Patient was awake and alert with EMS and then may be had some seizure type episode but then noticed to have some right-sided facial droop thus code stroke was called.  Not following commands.  The history is provided by the EMS personnel.  Neurologic Problem This is a new problem. The current episode started less than 1 hour ago. The problem occurs constantly. The problem has not changed since onset.Nothing aggravates the symptoms. Nothing relieves the symptoms. She has tried nothing for the symptoms. The treatment provided no relief.       Past Medical History:  Diagnosis Date  . Abnormal LFTs    Hx of  . CVA (cerebral vascular accident) (HCC) 2005  . Hyperlipidemia   . Hypertension   . Polysubstance dependence including opioid type drug, episodic abuse [facility])    cocaine/etoh     Patient Active Problem List   Diagnosis Date Noted  . Seizures (HCC) 02/09/2020  . Hyponatremia 02/09/2020  . SIRS (systemic inflammatory response syndrome) (HCC) 02/09/2020  . Cocaine abuse (HCC) 02/09/2020  . 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 Cerebral embolism with cerebral infarction 08/13/2019  . Status epilepticus (HCC) 08/05/2019  . Acute encephalopathy 08/05/2019  . TRANSAMINASES, SERUM, ELEVATED 10/23/2009  . HYPERCHOLESTEROLEMIA 07/03/2008  . ALCOHOL USE 07/03/2008  . Essential hypertension  07/03/2008  . STROKE 07/03/2008  . DYSPHAGIA UNSPECIFIED 07/03/2008  . Glucose intolerance 07/03/2008  . Pain in joint, shoulder region 06/22/2008  . SPONDYLOSIS 06/22/2008  . CERVICALGIA 06/22/2008  . SPINAL STENOSIS 06/22/2008  . CERVICAL SPASM 06/22/2008    Past Surgical History:  Procedure Laterality Date  . ESOPHAGOGASTRODUODENOSCOPY N/A 08/16/2019   Procedure: ESOPHAGOGASTRODUODENOSCOPY (EGD);  Surgeon: Vida Rigger, MD;  Location: [facility] ENDOSCOPY;  Service: Endoscopy;  Laterality: N/A;     OB History    Gravida  2   Para  0   Term      Preterm      AB  2   Living        SAB  2   IAB      Ectopic      Multiple      Live Births              Family History  Problem Relation Age of Onset  . Cancer Father        prostate   . Hypertension Mother   . Asthma Mother   . Bronchitis Mother     Social History   Tobacco Use  . Smoking status: Never Smoker  . Smokeless tobacco: Never Used  Substance Use Topics  . Alcohol use: Yes    Comment: Drinks three 40 ounces beers 3 days per week , other days none to one 40 ounce beer per day  . Drug use: No    Types: Cocaine    Comment: not now    Home Medications Prior to Admission medications  Medication Sig Start Date End Date Taking? Authorizing Provider  amLODipine (NORVASC) 10 MG tablet TAKE 1 TABLET BY MOUTH EVERY DAY (NEEDS TO MAKE AN APPOINTMENT) 02/17/20   Cleora FleetJohnson, Clanford L, MD  folic acid (FOLVITE) 1 MG tablet Take 1 tablet (1 mg total) by mouth daily. 02/18/20   Johnson, Clanford L, MD  lacosamide (VIMPAT) 200 MG TABS tablet Take 1 tablet (200 mg total) by mouth 2 (two) times daily. 02/17/20   Johnson, Clanford L, MD  levETIRAcetam (KEPPRA) 750 MG tablet Take 2 tablets (1,500 mg total) by mouth 2 (two) times daily. 02/17/20 04/17/20  Johnson, Clanford L, MD  metoprolol tartrate (LOPRESSOR) 25 MG tablet Take 1 tablet (25 mg total) by mouth 2 (two) times daily. 02/17/20   Johnson, Clanford L, MD  Multiple  Vitamin (MULTIVITAMIN WITH MINERALS) TABS tablet Take 1 tablet by mouth daily. 02/18/20   Johnson, Clanford L, MD  simvastatin (ZOCOR) 20 MG tablet Take 1 tablet (20 mg total) by mouth daily at 6 PM. 02/17/20   Johnson, Clanford L, MD  thiamine 100 MG tablet Take 1 tablet (100 mg total) by mouth daily. 02/18/20   Cleora FleetJohnson, Clanford L, MD    Allergies    Patient has no known allergies.  Review of Systems   Review of Systems  Unable to perform ROS: Mental status change    Physical Exam Updated Vital Signs Pulse (!) 116   Resp (!) 23   SpO2 100%   Physical Exam Vitals and nursing note reviewed.  Constitutional:      General: She is in acute distress.     Appearance: She is well-developed.  HENT:     Head: Normocephalic and atraumatic.     Nose: Nose normal.     Mouth/Throat:     Mouth: Mucous membranes are moist.  Eyes:     Extraocular Movements: Extraocular movements intact.     Conjunctiva/sclera: Conjunctivae normal.     Pupils: Pupils are equal, round, and reactive to light.  Cardiovascular:     Rate and Rhythm: Normal rate and regular rhythm.     Pulses: Normal pulses.     Heart sounds: No murmur heard.   Pulmonary:     Effort: Pulmonary effort is normal. No respiratory distress.     Breath sounds: Normal breath sounds.  Abdominal:     Palpations: Abdomen is soft.     Tenderness: There is no abdominal tenderness.  Musculoskeletal:     Cervical back: Normal range of motion and neck supple.  Skin:    General: Skin is warm and dry.     Capillary Refill: Capillary refill takes less than 2 seconds.  Neurological:     Mental Status: She is alert.     Comments: Moves all extremities, does not follow commands, opens eyes spontaneously, localizes the pain, mumbles, some left-sided gaze deviation  Psychiatric:     Comments: Agitated     ED Results / Procedures / Treatments   Labs (all labs ordered are listed, but only abnormal results are displayed) Labs Reviewed   CBC - Abnormal; Notable for the following components:      Result Value   WBC 11.8 (*)    All other components within normal limits  DIFFERENTIAL - Abnormal; Notable for the following components:   Neutro Abs 9.2 (*)    Monocytes Absolute 1.3 (*)    All other components within normal limits  COMPREHENSIVE METABOLIC PANEL - Abnormal; Notable for the following components:   CO2  21 (*)    Glucose, Bld 127 (*)    Creatinine, Ser 1.21 (*)    AST 56 (*)    GFR, Estimated 51 (*)    All other components within normal limits  I-STAT CHEM 8, ED - Abnormal; Notable for the following components:   Creatinine, Ser 1.10 (*)    Glucose, Bld 127 (*)    Calcium, Ion 1.10 (*)    All other components within normal limits  CBG MONITORING, ED - Abnormal; Notable for the following components:   Glucose-Capillary 126 (*)    All other components within normal limits  RESP PANEL BY RT-PCR (FLU A&B, COVID) ARPGX2  ETHANOL  PROTIME-INR  APTT  RAPID URINE DRUG SCREEN, HOSP PERFORMED  URINALYSIS, ROUTINE W REFLEX MICROSCOPIC    EKG None  Radiology CT HEAD CODE STROKE WO CONTRAST  Result Date: 08/23/2020 CLINICAL DATA:  Code stroke.  Right-sided weakness EXAM: CT HEAD WITHOUT CONTRAST TECHNIQUE: Contiguous axial images were obtained from the base of the skull through the vertex without intravenous contrast. COMPARISON:  02/09/2020 FINDINGS: Brain: No acute intracranial hemorrhage, mass effect, or edema. No definite new loss of gray-white differentiation. A small area hypoattenuation along the anterior left insula appears to be through an area of artifact. There are chronic infarcts of the right putamen and thalamus. Additional patchy and confluent areas of hypoattenuation in the supratentorial white matter are nonspecific but probably reflect similar moderate chronic microvascular ischemic changes. Prominence of the ventricles and sulci reflects similar parenchymal volume loss. Vascular: No hyperdense  vessel. There is intracranial atherosclerotic calcification at the skull base. Skull: Unremarkable. Sinuses/Orbits: No acute abnormality. Other: Mastoid air cells are clear. ASPECTS ([place] Stroke Program Early CT Score) - Ganglionic level infarction (caudate, lentiform nuclei, internal capsule, insula, M1-M3 cortex): 7 - Supraganglionic infarction (M4-M6 cortex): 3 Total score (0-10 with 10 being normal): 10 IMPRESSION: There is no acute intracranial hemorrhage or evidence of acute infarction. ASPECT score is 10. These results were communicated to Dr. Roda Shutters at 1:45 pm on 08/23/2020 by text page via the [facility] messaging system. Electronically Signed   By: Guadlupe Spanish M.D.   On: 08/23/2020 13:47   CT ANGIO HEAD NECK W WO CM W PERF (CODE STROKE)  Result Date: 08/23/2020 CLINICAL DATA:  Right-sided weakness EXAM: CT HEAD WITHOUT CONTRAST CT ANGIOGRAPHY HEAD AND NECK CT PERFUSION BRAIN TECHNIQUE: Multidetector CT imaging of the head and neck was performed using the standard protocol during bolus administration of intravenous contrast. Multiplanar CT image reconstructions and MIPs were obtained to evaluate the vascular anatomy. Carotid stenosis measurements (when applicable) are obtained utilizing NASCET criteria, using the distal internal carotid diameter as the denominator. Multiphase CT imaging of the brain was performed following IV bolus contrast injection. Subsequent parametric perfusion maps were calculated using RAPID software. CONTRAST:  OMNIPAQUE IOHEXOL 350 MG/ML SOLN COMPARISON:  None. FINDINGS: CTA NECK FINDINGS Aortic arch: Great vessel origins are patent. Right carotid system: Patent. Mild noncalcified plaque at the ICA origin causing minimal stenosis. Left carotid system: Patent. Mild mixed plaque at the ICA origin without stenosis. Vertebral arteries: Patent. Right vertebral artery is dominant. No stenosis. Skeleton: Degenerative changes of the cervical spine. Other neck: Negative. Upper chest: No  apical lung mass. Review of the MIP images confirms the above findings CTA HEAD FINDINGS Anterior circulation: Intracranial internal carotid arteries are patent with mild calcified plaque. Anterior and middle cerebral arteries are patent. Mild stenosis of the mid left M1 MCA. Posterior circulation: Intracranial vertebral  arteries are patent with mild calcified plaque. Basilar artery is patent. Major cerebellar artery origins are patent. Posterior cerebral arteries are patent. A right posterior communicating artery is present. Venous sinuses: As permitted by contrast timing, patent. Review of the MIP images confirms the above findings CT Brain Perfusion Findings: CBF (<30%) Volume: 0mL Perfusion (Tmax>6.0s) volume: 8mL Mismatch Volume: 8mL, likely artifactual. Infarction Location:  None. IMPRESSION: No large vessel occlusion or hemodynamically significant stenosis. Mild left mid M1 MCA stenosis. Perfusion imaging demonstrates no evidence of core infarction or penumbra. 8 mL of calculated penumbra in the bilateral anterior frontal lobes is likely artifactual. Electronically Signed   By: Guadlupe Spanish M.D.   On: 08/23/2020 14:09    Procedures Procedures   Medications Ordered in ED Medications  LORazepam (ATIVAN) injection 2 mg (has no administration in time range)  lacosamide (VIMPAT) 200 mg in sodium chloride 0.9 % 25 mL IVPB (has no administration in time range)  LORazepam (ATIVAN) 2 MG/ML injection (has no administration in time range)  levETIRAcetam (KEPPRA) IVPB 1000 mg/100 mL premix (has no administration in time range)    And  levETIRAcetam (KEPPRA) IVPB 1000 mg/100 mL premix (has no administration in time range)  aspirin suppository 300 mg (has no administration in time range)  iohexol (OMNIPAQUE) 350 MG/ML injection 100 mL (100 mLs Intravenous Contrast Given 08/23/20 1358)    ED Course  I have reviewed the triage vital signs and the nursing notes.  Pertinent labs & imaging results that were  available during my care of the patient were reviewed by me and considered in my medical decision making (see chart for details).    MDM Rules/Calculators/A&P                          RAJA CAPUTI is a 60 year old female with history of stroke, seizures, polysubstance abuse who presents the ED with altered mental status.  Arrives as a code stroke due to right-sided facial weakness that occurred about an hour ago.  According to EMS husband states that she has had likely seizure episodes on and off since last night.  Patient is supposed to be on Keppra and Vimpat.  Neurology at the bedside upon arrival.  She seems may be having some subclinical seizures/status epilepticus.  No obvious facial droop or focal neurologic findings.  CT scan and CT perfusion study negative for head bleed or large vessel occlusion.  Patient was given 2 mg Ativan and loaded with Keppra and Vimpat.  Patient appears to be moving all of her extremities, open her eyes spontaneously, mumbling words, located in to pain.  Appears to be doing enough to protect her airway at this time.  Seems to be improving following Ativan.  Plan is to get MRI to rule out stroke and then will get EEG to rule out status epilepticus.  Lab work thus far shows no significant anemia, electrolyte abnormality, kidney injury.  No hyponatremia.  Alcohol level is undetectable.  Blood sugar is within normal limits.  Unable to get in touch with family to talk about if she still drinking alcohol heavily.  Could be alcohol withdrawal seizures as well.  Neurology to continue to follow along.  If she is in status may need more Ativan/possibly intubation to suppress seizure activity.  Awaiting EEG, MRI.  Likely patient to be handed off to oncoming ED staff with patient pending final disposition.   This chart was dictated using voice recognition software.  Despite  best efforts to proofread,  errors can occur which can change the documentation meaning.    Final Clinical  Impression(s) / ED Diagnoses Final diagnoses:  Altered mental status, unspecified altered mental status type    Rx / DC Orders ED Discharge Orders    None       Virgina Norfolk, DO 08/23/20 1450    Virgina Norfolk, DO 08/23/20 1450

## 2020-08-23 NOTE — Progress Notes (Signed)
EEG complete - results pending 

## 2020-08-24 DIAGNOSIS — R569 Unspecified convulsions: Secondary | ICD-10-CM

## 2020-08-24 DIAGNOSIS — E440 Moderate protein-calorie malnutrition: Secondary | ICD-10-CM | POA: Insufficient documentation

## 2020-08-24 LAB — MAGNESIUM: Magnesium: 1.9 mg/dL (ref 1.7–2.4)

## 2020-08-24 LAB — AMMONIA: Ammonia: 13 umol/L (ref 9–35)

## 2020-08-24 LAB — PHOSPHORUS: Phosphorus: 3 mg/dL (ref 2.5–4.6)

## 2020-08-24 LAB — RPR: RPR Ser Ql: NONREACTIVE

## 2020-08-24 MED ORDER — ENSURE ENLIVE PO LIQD
237.0000 mL | Freq: Three times a day (TID) | ORAL | Status: DC
  Administered 2020-08-25 – 2020-08-31 (×11): 237 mL via ORAL

## 2020-08-24 MED ORDER — ASPIRIN 325 MG PO TABS
325.0000 mg | ORAL_TABLET | Freq: Every day | ORAL | Status: DC
  Administered 2020-08-24 – 2020-08-25 (×2): 325 mg via ORAL
  Filled 2020-08-24 (×3): qty 1

## 2020-08-24 NOTE — Progress Notes (Signed)
PROGRESS NOTE    Cindy [name]  ONG:[id number] DOB: 08/30/1960 DOA: 08/23/2020 PCP: Avon Gully, MD    Chief Complaint  Patient presents with  . Code Stroke    Brief Narrative:  60 year old lady with prior h/o hypertension, hyperlipidemia, CVA, seizure, with prior h/o status epilepticus, polysubstance abuse ( use of cocaine and alcohol) admitted for altered mental status.   The patient's husband called EMS after finding her acutely altered, unresponsive with urinary incontinence, and reports of shaking/jerking episodes lasting 5 to 10 minutes on the floor since last night around 9 PM.  After each episode patient was reported to be better, but still have slurred speech and was not able to get up. Upon admission to the emergency department patient was seen as a code stroke and evaluated by neurology.  CT scan of the head was negative for any acute abnormalities.  CTA of the head neck showed no large vessel occlusion.  Neurology consulted.   Assessment & Plan:   Active Problems:   HYPERCHOLESTEROLEMIA   Essential hypertension   Elevated AST (SGOT)   Seizures (HCC)   SIRS (systemic inflammatory response syndrome) (HCC)   Acute metabolic encephalopathy   Polysubstance abuse (HCC)   AKI (acute kidney injury) (HCC)   History of stroke   Malnutrition of moderate degree   Acute Metabolic encephalopathy: pt at baseline is alert and oriented, today she is lethargic, not following commands, and confused.  - Probably secondary to seizures / status epilepticus. MRI is negative for acute stroke.  CTA of the head and neck is negative for large vessel occlusion.  Continuous EEG monitoring.  Neurology on board and appreciate recommendations.  Lactic acid is wnl.  RPR and HIV screen is negative.  TSH wnl.  MRI shows Remote lacunar infarct in the bilateral centrum semiovale, corona radiata and right thalamus. Advanced chronic microvascular ischemic changes. Mild parenchymal volume  loss. Continue with vimpat and keppra and ativan PRN.    Hypertension:  Well controlled.   Malnutrition of moderate degree:  Nutrition consulted.     Rhabdomyolysis:  Probably secondary to seizures/ status epilepticus.  Continue with IV fluids.    UDS is positive for cocaine,  SW consult for resources.   H/o alcohol abuse:  No signs of withdrawal.    GERD:  Continue with PPI.     DVT prophylaxis: SCD'S Code Status: full code.  Family Communication: none at bedside.  Disposition:   Status is: Inpatient  Remains inpatient appropriate because:Ongoing diagnostic testing needed not appropriate for outpatient work up   Dispo: The patient is from: Home              Anticipated d/c is to: pending.               Patient currently is not medically stable to d/c.   Difficult to place patient No       Consultants:   NEUROLOGY.    Procedures: EEG   Antimicrobials: none.    Subjective: Lethargic, confused,   Objective: Vitals:   08/24/20 1140 08/24/20 1145 08/24/20 1300 08/24/20 1400  BP: (!) 145/86     Pulse: (!) 111  99 98  Resp: 19 20    Temp:      TempSrc:      SpO2:      Weight:        Intake/Output Summary (Last 24 hours) at 08/24/2020 1517 Last data filed at 08/24/2020 4401 Gross per 24 hour  Intake 1043.83  ml  Output 200 ml  Net 843.83 ml   Filed Weights   08/23/20 1300  Weight: 56.6 kg    Examination:  General exam: Appears calm and comfortable  Respiratory system: Clear to auscultation. Respiratory effort normal. Cardiovascular system: S1 & S2 heard, RRR. No JVD,  No pedal edema. Gastrointestinal system: Abdomen is nondistended, soft and nontender. Normal bowel sounds heard. Central nervous system: lethargic, confused.  Extremities: no pedal edema.  Skin: No rashes, lesions or ulcers Psychiatry: Mood & affect appropriate.     Data Reviewed: I have personally reviewed following labs and imaging studies  CBC: Recent Labs   Lab 08/23/20 1330 08/23/20 1335  WBC 11.8*  --   NEUTROABS 9.2*  --   HGB 13.7 14.3  HCT 40.6 42.0  MCV 94.6  --   PLT 382  --     Basic Metabolic Panel: Recent Labs  Lab 08/23/20 1330 08/23/20 1335 08/24/20 0330  NA 135 136  --   K 4.2 4.2  --   CL 105 104  --   CO2 21*  --   --   GLUCOSE 127* 127*  --   BUN 13 17  --   CREATININE 1.21* 1.10*  --   CALCIUM 9.4  --   --   MG  --   --  1.9  PHOS  --   --  3.0    GFR: CrCl cannot be calculated (Unknown ideal weight.).  Liver Function Tests: Recent Labs  Lab 08/23/20 1330  AST 56*  ALT 18  ALKPHOS 73  BILITOT 1.0  PROT 7.5  ALBUMIN 3.7    CBG: Recent Labs  Lab 08/23/20 1329  GLUCAP 126*     Recent Results (from the past 240 hour(s))  Resp Panel by RT-PCR (Flu A&B, Covid) Nasopharyngeal Swab     Status: None   Collection Time: 08/23/20  5:09 PM   Specimen: Nasopharyngeal Swab; Nasopharyngeal(NP) swabs in vial transport medium  Result Value Ref Range Status   SARS Coronavirus 2 by RT PCR NEGATIVE NEGATIVE Final    Comment: (NOTE) SARS-CoV-2 target nucleic acids are NOT DETECTED.  The SARS-CoV-2 RNA is generally detectable in upper respiratory specimens during the acute phase of infection. The lowest concentration of SARS-CoV-2 viral copies this assay can detect is 138 copies/mL. A negative result does not preclude SARS-Cov-2 infection and should not be used as the sole basis for treatment or other patient management decisions. A negative result may occur with  improper specimen collection/handling, submission of specimen other than nasopharyngeal swab, presence of viral mutation(s) within the areas targeted by this assay, and inadequate number of viral copies(<138 copies/mL). A negative result must be combined with clinical observations, patient history, and epidemiological information. The expected result is Negative.  Fact Sheet for Patients:  BloggerCourse.comhttps://www.fda.gov/media/152166/download  Fact  Sheet for Healthcare Providers:  SeriousBroker.ithttps://www.fda.gov/media/152162/download  This test is no t yet approved or cleared by the [place] FDA and  has been authorized for detection and/or diagnosis of SARS-CoV-2 by FDA under an Emergency Use Authorization (EUA). This EUA will remain  in effect (meaning this test can be used) for the duration of the COVID-19 declaration under Section 564(b)(1) of the Act, 21 U.S.C.section 360bbb-3(b)(1), unless the authorization is terminated  or revoked sooner.       Influenza A by PCR NEGATIVE NEGATIVE Final   Influenza B by PCR NEGATIVE NEGATIVE Final    Comment: (NOTE) The Xpert Xpress SARS-CoV-2/FLU/RSV plus assay is intended as  an aid in the diagnosis of influenza from Nasopharyngeal swab specimens and should not be used as a sole basis for treatment. Nasal washings and aspirates are unacceptable for Xpert Xpress SARS-CoV-2/FLU/RSV testing.  Fact Sheet for Patients: BloggerCourse.com  Fact Sheet for Healthcare Providers: SeriousBroker.it  This test is not yet approved or cleared by the [place] FDA and has been authorized for detection and/or diagnosis of SARS-CoV-2 by FDA under an Emergency Use Authorization (EUA). This EUA will remain in effect (meaning this test can be used) for the duration of the COVID-19 declaration under Section 564(b)(1) of the Act, 21 U.S.C. section 360bbb-3(b)(1), unless the authorization is terminated or revoked.  Performed at [facility], [address] [place], [place] [address]          Radiology Studies: MR BRAIN W WO CONTRAST  Result Date: 08/23/2020 CLINICAL DATA:  Stroke follow-up. EXAM: MRI HEAD WITHOUT AND WITH CONTRAST TECHNIQUE: Multiplanar, multiecho pulse sequences of the brain and surrounding structures were obtained without and with intravenous contrast. CONTRAST:  42mL GADAVIST GADOBUTROL 1 MMOL/ML IV SOLN COMPARISON:  Head CT  August 23, 2020. FINDINGS: Brain: No acute infarction, hemorrhage, hydrocephalus, extra-axial collection or mass lesion. Remote lacunar infarct in the bilateral centrum semiovale, corona radiata and right thalamus. Scattered confluent foci of T2 hyperintensity are seen within the white matter of the cerebral hemispheres, nonspecific. Mild parenchymal volume loss. No focus of abnormal contrast enhancement. Vascular: Normal flow voids. Skull and upper cervical spine: Normal marrow signal. Sinuses/Orbits: Negative. Other: Small right mastoid effusion. IMPRESSION: 1. No acute intracranial abnormality. 2. Remote lacunar infarct in the bilateral centrum semiovale, corona radiata and right thalamus. 3. Advanced chronic microvascular ischemic changes. Mild parenchymal volume loss. 4. Small right mastoid effusion. Electronically Signed   By: Baldemar Lenis M.D.   On: 08/23/2020 15:18   CT C-SPINE NO CHARGE  Result Date: 08/23/2020 CLINICAL DATA:  Status post fall. EXAM: CT CERVICAL SPINE WITHOUT CONTRAST TECHNIQUE: Multidetector CT imaging of the cervical spine was performed without intravenous contrast. Multiplanar CT image reconstructions were also generated. COMPARISON:  August 02, 2008 FINDINGS: Alignment: Approximately 1 mm to 2 mm retrolisthesis of the C4 vertebral body is seen on C5 and C5 vertebral body on C6. Skull base and vertebrae: No acute fracture. No primary bone lesion or focal pathologic process. Soft tissues and spinal canal: No prevertebral fluid or swelling. No visible canal hematoma. Disc levels: Marked severity endplate sclerosis and osteophyte formation is seen at the levels of C4-C5, C5-C6 and C6-C7. Marked severity intervertebral disc space narrowing is also seen at the levels of C4-C5, C5-C6 and C6-C7. Moderate to marked severity bilateral multilevel facet joint hypertrophy is noted. Upper chest: Negative. Other: None. IMPRESSION: 1. Marked severity multilevel degenerative changes,  most prominent at the levels of C4-C5, C5-C6 and C6-C7. 2. No acute cervical spine fracture. Electronically Signed   By: Aram Candela M.D.   On: 08/23/2020 16:30   DG CHEST PORT 1 VIEW  Result Date: 08/23/2020 CLINICAL DATA:  Seizure EXAM: PORTABLE CHEST 1 VIEW COMPARISON:  02/09/2020 FINDINGS: Heart is borderline in size. Lungs are clear. No effusions or acute bony abnormality. IMPRESSION: No active disease. Electronically Signed   By: Charlett Nose M.D.   On: 08/23/2020 18:28   Overnight EEG with video  Result Date: 08/24/2020 Charlsie Quest, MD     08/24/2020  4:30 AM Patient Name: KAYLOR MAIERS MRN: [id number] Epilepsy Attending: Charlsie Quest Referring Physician/Provider: Dr  Marvel Plan Duration: 08/23/2020 1601 to 08/24/2020 0425 Patient history: 60 yo F w possible seizure activity with left gaze deviation and shaking. EEG to evaluate for seizure. Level of alertness: Awake, asleep AEDs during EEG study: LEV, LCM Technical aspects: This EEG study was done with scalp electrodes positioned according to the 10-20 International system of electrode placement. Electrical activity was acquired at a sampling rate of 500Hz  and reviewed with a high frequency filter of 70Hz  and a low frequency filter of 1Hz . EEG data were recorded continuously and digitally stored. Description: The posterior dominant rhythm consists of 8 Hz activity of moderate voltage (25-35 uV) seen predominantly in posterior head regions, asymmetric ( L<R) and reactive to eye opening and eye closing. Drowsiness was characterized by attenuation of the posterior background rhythm. Sleep was characterized by vertex waves, sleep spindles (12 to 14 Hz), maximal right frontocentral region. EEG showed continuous 3 to 6 Hz theta-delta slowing in left heisphere. Sharp transients were seen in eft occipital region.  Hyperventilation and photic stimulation were not performed.   ABNORMALITY - Continuous slow, Left hemisphere - Background asymmetry, left  <right - Sleep asymmetry, Left<right IMPRESSION: This study is suggestive of cortical dysfunction arising from left hemisphere likely secondary to underlying structural abnormality, post-ictal state. No seizures or definite epileptiform discharges were seen throughout the recording.   CT HEAD CODE STROKE WO CONTRAST  Result Date: 08/23/2020 CLINICAL DATA:  Code stroke.  Right-sided weakness EXAM: CT HEAD WITHOUT CONTRAST TECHNIQUE: Contiguous axial images were obtained from the base of the skull through the vertex without intravenous contrast. COMPARISON:  02/09/2020 FINDINGS: Brain: No acute intracranial hemorrhage, mass effect, or edema. No definite new loss of gray-white differentiation. A small area hypoattenuation along the anterior left insula appears to be through an area of artifact. There are chronic infarcts of the right putamen and thalamus. Additional patchy and confluent areas of hypoattenuation in the supratentorial white matter are nonspecific but probably reflect similar moderate chronic microvascular ischemic changes. Prominence of the ventricles and sulci reflects similar parenchymal volume loss. Vascular: No hyperdense vessel. There is intracranial atherosclerotic calcification at the skull base. Skull: Unremarkable. Sinuses/Orbits: No acute abnormality. Other: Mastoid air cells are clear. ASPECTS ([place]ber[place] Stroke Program Early CT Score) - Ganglionic level infarction (caudate, lentiform nuclei, internal capsule, insula, M1-M3 cortex): 7 - Supraganglionic infarction (M4-M6 cortex): 3 Total score (0-10 with 10 being normal): 10 IMPRESSION: There is no acute intracranial hemorrhage or evidence of acute infarction. ASPECT score is 10. These results were communicated to Dr. Charlsie Quest at 1:45 pm on 08/23/2020 by text page via the [facility] messaging system. Electronically Signed   By: Roda Shutters M.D.   On: 08/23/2020 13:47   CT ANGIO HEAD NECK W WO CM W PERF (CODE STROKE)  Result Date:  08/23/2020 CLINICAL DATA:  Right-sided weakness EXAM: CT HEAD WITHOUT CONTRAST CT ANGIOGRAPHY HEAD AND NECK CT PERFUSION BRAIN TECHNIQUE: Multidetector CT imaging of the head and neck was performed using the standard protocol during bolus administration of intravenous contrast. Multiplanar CT image reconstructions and MIPs were obtained to evaluate the vascular anatomy. Carotid stenosis measurements (when applicable) are obtained utilizing NASCET criteria, using the distal internal carotid diameter as the denominator. Multiphase CT imaging of the brain was performed following IV bolus contrast injection. Subsequent parametric perfusion maps were calculated using RAPID software. CONTRAST:  12-22-1988 OMNIPAQUE IOHEXOL 350 MG/ML SOLN COMPARISON:  None. FINDINGS: CTA NECK FINDINGS Aortic arch: Great vessel origins are patent. Right carotid system:  Patent. Mild noncalcified plaque at the ICA origin causing minimal stenosis. Left carotid system: Patent. Mild mixed plaque at the ICA origin without stenosis. Vertebral arteries: Patent. Right vertebral artery is dominant. No stenosis. Skeleton: Degenerative changes of the cervical spine. Other neck: Negative. Upper chest: No apical lung mass. Review of the MIP images confirms the above findings CTA HEAD FINDINGS Anterior circulation: Intracranial internal carotid arteries are patent with mild calcified plaque. Anterior and middle cerebral arteries are patent. Mild stenosis of the mid left M1 MCA. Posterior circulation: Intracranial vertebral arteries are patent with mild calcified plaque. Basilar artery is patent. Major cerebellar artery origins are patent. Posterior cerebral arteries are patent. A right posterior communicating artery is present. Venous sinuses: As permitted by contrast timing, patent. Review of the MIP images confirms the above findings CT Brain Perfusion Findings: CBF (<30%) Volume: 59mL Perfusion (Tmax>6.0s) volume: 64mL Mismatch Volume: 53mL, likely artifactual.  Infarction Location:  None. IMPRESSION: No large vessel occlusion or hemodynamically significant stenosis. Mild left mid M1 MCA stenosis. Perfusion imaging demonstrates no evidence of core infarction or penumbra. 8 mL of calculated penumbra in the bilateral anterior frontal lobes is likely artifactual. Electronically Signed   By: Guadlupe Spanish M.D.   On: 08/23/2020 14:09        Scheduled Meds: . aspirin  325 mg Oral Daily  . enoxaparin (LOVENOX) injection  40 mg Subcutaneous Q24H  . feeding supplement  237 mL Oral TID with meals  . folic acid  1 mg Oral Daily  . multivitamin with minerals  1 tablet Oral Daily  . pantoprazole (PROTONIX) IV  40 mg Intravenous Q24H  . thiamine  100 mg Oral Daily   Or  . thiamine  100 mg Intravenous Daily   Continuous Infusions: . sodium chloride 75 mL/hr (08/24/20 0635)  . sodium chloride 10 mL/hr at 08/24/20 0635  . lacosamide (VIMPAT) IV 200 mg (08/24/20 1025)  . levETIRAcetam 1,500 mg (08/24/20 2423)     LOS: 1 day       Kathlen Mody, MD [facility]   To contact the attending provider between 7A-7P or the covering provider during after hours 7P-7A, please log into the web site [email or web address] and access using universal [place] password for that web site. If you do not have the password, please call the hospital operator.  08/24/2020, 3:17 PM

## 2020-08-24 NOTE — Procedures (Addendum)
Patient Name: Cindy [name]  MRN: [id number]  Epilepsy Attending: Charlsie Quest  Referring Physician/Provider: Dr Marvel Plan Duration: 08/23/2020 1601 to 08/24/2020 0730  Patient history: 60 yo F w possible seizure activity with left gaze deviation and shaking. EEG to evaluate for seizure.  Level of alertness: Awake, asleep  AEDs during EEG study: LEV, LCM  Technical aspects: This EEG study was done with scalp electrodes positioned according to the 10-20 International system of electrode placement. Electrical activity was acquired at a sampling rate of 500Hz  and reviewed with a high frequency filter of 70Hz  and a low frequency filter of 1Hz . EEG data were recorded continuously and digitally stored.   Description: The posterior dominant rhythm consists of 8 Hz activity of moderate voltage (25-35 uV) seen predominantly in posterior head regions, asymmetric ( L<R) and reactive to eye opening and eye closing. Drowsiness was characterized by attenuation of the posterior background rhythm. Sleep was characterized by vertex waves, sleep spindles (12 to 14 Hz), maximal right frontocentral region. EEG showed continuous 3 to 6 Hz theta-delta slowing in left heisphere. Sharp transients were seen in eft occipital region.  Hyperventilation and photic stimulation were not performed.     ABNORMALITY - Continuous slow, Left hemisphere - Background asymmetry, left <right - Sleep asymmetry, Left<right  IMPRESSION: This study is suggestive of cortical dysfunction arising from left hemisphere likely secondary to underlying structural abnormality, post-ictal state. No seizures or definite epileptiform discharges were seen throughout the recording.   Nasean Zapf 

## 2020-08-24 NOTE — Progress Notes (Addendum)
LTM maint complete - no skin breakdown under: A1 Fp2  Re prep F4 F3 Cz C4 P4 A1

## 2020-08-24 NOTE — Progress Notes (Signed)
Arrived to pt room to perform LTM maint. Pt became aggitated and combative RN present in room. Will try back as schedule permits for LTM maint

## 2020-08-24 NOTE — Progress Notes (Signed)
Neurology Progress Note Subjective: Patient with some improvement in mental status compared to note on neurology consultation 4/7. EEG is without seizures or definite epileptiform discharges overnight.   Exam: Vitals:   08/24/20 0700 08/24/20 0747  BP:  130/82  Pulse: 79 80  Resp: 17 17  Temp:  98.1 F (36.7 C)  SpO2: 100% 98%   Gen: In bed, in no acute distress Resp: non-labored breathing, no respiratory distress Abd: soft, non-distended  Neuro: Mental Status:  Sleeping initially but opens eyes to voice. Patient does not answer orientation questions, sometimes she states "yeah" in a whisper when asked questions but inconsistently and unreliably. She does not vocalize and does not follow commands. She is unable to provide a clear or reliable history of present illness. Speech is unable to be assessed due to patient without vocalization.  Cranial Nerves: Pupils 2 mm, equal, round, and reactive to light bilaterally, will fixate and track examiner, eyelids with full and symmetrical elevation, unable to assess facial sensation but face appears symmetric resting, patient does open eyes to voice, head appears midline, patient does not protrude tongue on command.  Motor: Left upper extremity with spontaneous and antigravity movements. She is able to hold the left arm against gravity without vertical drift with constant coaching. Does not move the right upper extremity against gravity with immediate drift to bed. Bilateral lower extremities withdrawal to noxious stimuli equally. Patient with restless / picking / grabbing but purposeful movement noted of the left upper extremity.  Plantars: Downgoing bilaterally Sensory: Grimace and localization of the left upper extremity to noxious stimuli much more brisk than right upper extremity.  DTR: 3+ and symmetric patellae and biceps  Pertinent Labs: CBC    Component Value Date/Time   WBC 11.8 (H) 08/23/2020 1330   RBC 4.29 08/23/2020 1330   HGB  14.3 08/23/2020 1335   HGB 14.8 03/26/2015 0921   HCT 42.0 08/23/2020 1335   HCT 42.3 03/26/2015 0921   PLT 382 08/23/2020 1330   PLT 363 03/26/2015 0921   MCV 94.6 08/23/2020 1330   MCV 96 03/26/2015 0921   MCH 31.9 08/23/2020 1330   MCHC 33.7 08/23/2020 1330   RDW 11.9 08/23/2020 1330   RDW 12.5 03/26/2015 0921   LYMPHSABS 1.2 08/23/2020 1330   MONOABS 1.3 (H) 08/23/2020 1330   EOSABS 0.0 08/23/2020 1330   BASOSABS 0.0 08/23/2020 1330   CMP     Component Value Date/Time   NA 136 08/23/2020 1335   NA 135 (L) 03/26/2015 0921   K 4.2 08/23/2020 1335   CL 104 08/23/2020 1335   CO2 21 (L) 08/23/2020 1330   GLUCOSE 127 (H) 08/23/2020 1335   BUN 17 08/23/2020 1335   BUN 7 03/26/2015 0921   CREATININE 1.10 (H) 08/23/2020 1335   CALCIUM 9.4 08/23/2020 1330   PROT 7.5 08/23/2020 1330   PROT 7.7 03/26/2015 0921   ALBUMIN 3.7 08/23/2020 1330   ALBUMIN 4.4 03/26/2015 0921   AST 56 (H) 08/23/2020 1330   ALT 18 08/23/2020 1330   ALKPHOS 73 08/23/2020 1330   BILITOT 1.0 08/23/2020 1330   BILITOT 0.6 03/26/2015 0921   GFRNONAA 51 (L) 08/23/2020 1330   GFRAA >60 02/16/2020 0319   Lab Results  Component Value Date   TSH 1.955 08/23/2020   Lab Results  Component Value Date   CKTOTAL 1,002 (H) 08/23/2020   Drugs of Abuse     Component Value Date/Time   LABOPIA NONE DETECTED 08/23/2020 1729   COCAINSCRNUR  POSITIVE (A) 08/23/2020 1729   LABBENZ NONE DETECTED 08/23/2020 1729   AMPHETMU NONE DETECTED 08/23/2020 1729   THCU NONE DETECTED 08/23/2020 1729   LABBARB NONE DETECTED 08/23/2020 1729    Urinalysis    Component Value Date/Time   COLORURINE YELLOW 08/23/2020 1729   APPEARANCEUR CLEAR 08/23/2020 1729   LABSPEC 1.042 (H) 08/23/2020 1729   PHURINE 7.0 08/23/2020 1729   GLUCOSEU NEGATIVE 08/23/2020 1729   HGBUR NEGATIVE 08/23/2020 1729   BILIRUBINUR NEGATIVE 08/23/2020 1729   KETONESUR 5 (A) 08/23/2020 1729   PROTEINUR NEGATIVE 08/23/2020 1729   NITRITE NEGATIVE  08/23/2020 1729   LEUKOCYTESUR NEGATIVE 08/23/2020 1729   Imaging:  CT C-Spine: IMPRESSION: 1. Marked severity multilevel degenerative changes, most prominent at the levels of C4-C5, C5-C6 and C6-C7. 2. No acute cervical spine fracture.  MRI brain:  1. No acute intracranial abnormality. 2. Remote lacunar infarct in the bilateral centrum semiovale, corona radiata and right thalamus. 3. Advanced chronic microvascular ischemic changes. Mild parenchymal volume loss. 4. Small right mastoid effusion.  CT/CT angio head and neck/CT cerebral perfusion: No acute intracranial hemorrhage or evidence of acute infarction. ASPECT score is 10. No large vessel occlusion or hemodynamically significant stenosis. Mild left mid M1 MCA stenosis. Perfusion imaging demonstrates no evidence of core infarction or penumbra. 8 mL of calculated penumbra in the bilateral anterior frontal lobes is likely artifactual.  EEG Overnight:  "This study is suggestive of cortical dysfunction arising from left hemisphere likely secondary to underlying structural abnormality, post-ictal state. 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  - Initial concern for stroke due to possible right facial droop, left gaze preference, and right-sided weakness. MRI and CT code stroke without evidence of acute infarct. Etiology most likely seizure versus status epilepticus.  - September 2021 EEG showed epileptogenicityand cortical dysfunctionarising from left hemisphere, maximal left posterior quadrant suggestive of underlying structural abnormality. This pattern is on the ictal-interictal continuum with higher potential for seizure recurrence.  She was diagnosed with  status epilepticus, treated with keppra and vimpat and continued on discharge.  - She was given 2 mg Ativan, 2 g Keppra load, and 200 mg Vimpat in the ED and resumed on her home dosing of AEDs.  - Seizures likely provoked due to substance abuse versus medication noncompliance versus possible alcohol withdrawal seizure (serum alcohol <10 on admission) or multifactorial. UDS positive on admission for cocaine.   Recommendations: - Continue home keppra 1,500 mg BID and Vimpat 200 mg BID - Continue LTM EEG - still does not appear baseline. - Inpatient seizure precautions - Lifestyle modifications; counsel on cocaine and alcohol abstinence when receptive - PRN Ativan 2 mg IV for any seizure lasting > 5 minutes  Lanae Boast, AGACNP-BC [facility] [phone]   Attending Neurohospitalist Addendum Patient seen and examined with APP/Resident. Agree with the history and physical as documented above. Agree with the plan as documented, which I helped formulate. I have independently reviewed the chart, obtained history, review of systems and examined the patient.I have personally reviewed pertinent head/neck/spine imaging (CT/MRI). Please feel free to call with any questions.  -- Milon Dikes, MD Neurologist [facility] Pager: [phone]

## 2020-08-24 NOTE — Progress Notes (Signed)
Initial Nutrition Assessment  DOCUMENTATION CODES:  Non-severe (moderate) malnutrition in context of chronic illness  INTERVENTION:  Continue finger foods diet - RD ordered through breakfast on 4/11.  Recommend 1:1 assist with meals.  Add Ensure Enlive po TID, each supplement provides 350 kcal and 20 grams of protein.  Continue MVI with minerals, folic acid, and thiamine daily.  NUTRITION DIAGNOSIS:  Moderate Malnutrition related to chronic illness (CVA, seizures, HTN) as evidenced by mild fat depletion,moderate fat depletion,mild muscle depletion,moderate muscle depletion.  GOAL:  Patient will meet greater than or equal to 90% of their needs  MONITOR:  PO intake,Supplement acceptance,Weight trends,Skin  REASON FOR ASSESSMENT:  Malnutrition Screening Tool    ASSESSMENT:  60 yo female with a PMH of HTN, HLD, CVA, seizures w/ prior hx of status epilepticus, and polysubstance abuse (EtOH and cocaine) who presents with acute metabolic encephalopathy. UDS positive on admission for cocaine. 4/7 - EEG without seizures 4/8 - brain MRI clear, head CT/angio clear  Attempted to speak with pt at bedside. Pt mumbled and did not respond to questions, despite being asked in various ways. She looked around the room and smiled as if she was seeing something that was not really there.  Pt okay with exam. On exam, felt some depletions in muscle and fat. From 02/2020, she lost about 10 lbs (7.3%) of body weight in 6 months. Pt moderately malnourished, at risk for severe.  Recommend adding Ensure TID to promote intake.   Would recommend for Finger Foods diet, as pt is confused and will benefit from finger foods. RD to order meals as [employer] is undergoing updates for this diet order. RD ordered meals through breakfast on Monday, 4/11.   Recommend that pt has 1:1 assistance with meals and supplements.  Relevant Medications: folic acid 1 mg, MVI with minerals, Protonix, thiamine 100 mg  injection Labs: reviewed; CBG 126  NUTRITION - FOCUSED PHYSICAL EXAM: Flowsheet Row Most Recent Value  Orbital Region Moderate depletion  Upper Arm Region Mild depletion  Thoracic and Lumbar Region No depletion  Buccal Region Mild depletion  Temple Region Moderate depletion  Clavicle Bone Region Mild depletion  Clavicle and Acromion Bone Region Moderate depletion  Scapular Bone Region Unable to assess  Dorsal Hand Moderate depletion  Patellar Region Moderate depletion  Anterior Thigh Region Mild depletion  Posterior Calf Region Mild depletion  Edema (RD Assessment) None  Hair Reviewed  Eyes Reviewed  Mouth Reviewed  Skin Reviewed  Nails Reviewed     Diet Order:   Diet Order            DIET FINGER FOODS Room service appropriate? No; Fluid consistency: Thin  Diet effective now                EDUCATION NEEDS:  Not appropriate for education at this time  Skin:  Skin Assessment: Reviewed RN Assessment (Abrasions/Rash on R leg and abdomen)  Last BM:  unknown/PTA  Height:  Ht Readings from Last 1 Encounters:  02/18/20 5\' 7"  (1.702 m)   Weight:  Wt Readings from Last 1 Encounters:  08/23/20 56.6 kg   Ideal Body Weight:  61.4 kg  BMI:  Body mass index is 19.54 kg/m.  Estimated Nutritional Needs:  Kcal:  1850-2050 Protein:  77-90 grams Fluid:  >1.85 L  10/23/20, RD, LDN Registered Dietitian After Hours/Weekend Pager # in [place]

## 2020-08-25 DIAGNOSIS — Z7189 Other specified counseling: Secondary | ICD-10-CM

## 2020-08-25 DIAGNOSIS — Z515 Encounter for palliative care: Secondary | ICD-10-CM

## 2020-08-25 DIAGNOSIS — G9341 Metabolic encephalopathy: Secondary | ICD-10-CM

## 2020-08-25 LAB — CBC WITH DIFFERENTIAL/PLATELET
Abs Immature Granulocytes: 0.02 10*3/uL (ref 0.00–0.07)
Basophils Absolute: 0 10*3/uL (ref 0.0–0.1)
Basophils Relative: 0 %
Eosinophils Absolute: 0.2 10*3/uL (ref 0.0–0.5)
Eosinophils Relative: 2 %
HCT: 38.9 % (ref 36.0–46.0)
Hemoglobin: 13.2 g/dL (ref 12.0–15.0)
Immature Granulocytes: 0 %
Lymphocytes Relative: 8 %
Lymphs Abs: 0.8 10*3/uL (ref 0.7–4.0)
MCH: 31.8 pg (ref 26.0–34.0)
MCHC: 33.9 g/dL (ref 30.0–36.0)
MCV: 93.7 fL (ref 80.0–100.0)
Monocytes Absolute: 1 10*3/uL (ref 0.1–1.0)
Monocytes Relative: 11 %
Neutro Abs: 7.2 10*3/uL (ref 1.7–7.7)
Neutrophils Relative %: 79 %
Platelets: 304 10*3/uL (ref 150–400)
RBC: 4.15 MIL/uL (ref 3.87–5.11)
RDW: 11.7 % (ref 11.5–15.5)
WBC: 9.2 10*3/uL (ref 4.0–10.5)
nRBC: 0 % (ref 0.0–0.2)

## 2020-08-25 LAB — BASIC METABOLIC PANEL
Anion gap: 8 (ref 5–15)
BUN: 8 mg/dL (ref 8–23)
CO2: 21 mmol/L — ABNORMAL LOW (ref 22–32)
Calcium: 8.7 mg/dL — ABNORMAL LOW (ref 8.9–10.3)
Chloride: 106 mmol/L (ref 98–111)
Creatinine, Ser: 0.87 mg/dL (ref 0.44–1.00)
GFR, Estimated: >60 mL/min (ref >60)
Glucose, Bld: 105 mg/dL — ABNORMAL HIGH (ref 70–99)
Potassium: 3.1 mmol/L — ABNORMAL LOW (ref 3.5–5.1)
Sodium: 135 mmol/L (ref 135–145)

## 2020-08-25 MED ORDER — ACETAMINOPHEN 325 MG PO TABS
650.0000 mg | ORAL_TABLET | Freq: Four times a day (QID) | ORAL | Status: DC | PRN
  Administered 2020-08-26: 650 mg via ORAL
  Filled 2020-08-25 (×2): qty 2

## 2020-08-25 MED ORDER — POTASSIUM CHLORIDE 10 MEQ/100ML IV SOLN
10.0000 meq | INTRAVENOUS | Status: AC
  Administered 2020-08-25 (×2): 10 meq via INTRAVENOUS
  Filled 2020-08-25 (×2): qty 100

## 2020-08-25 NOTE — Progress Notes (Signed)
Neurology Progress Note Subjective: LTM EEG with some improvement overnight but continues to show some left-sided epileptogenicity without frank seizures. No overnight acute events reported by the bedside RN.  Objective:  Vitals:   08/24/20 2359 08/25/20 0335  BP: 114/69 120/75  Pulse: 92 81  Resp: 20 20  Temp: 97.9 F (36.6 C) 97.6 F (36.4 C)  SpO2: 99% 98%  Examination: Gen: In bed, in no acute distress Resp: non-labored breathing, no respiratory distress Abd: soft, non-distended  Neurological examination: Mental Status:  Comfortably sleeping in bed, does not open eyes to voice. To noxious stimulation open eyes but does not consistently following commands. Cranial Nerves: Pupils 2 mm equal round reactive light bilaterally, no gaze preference or deviation, does not blink to threat from either side, face appears grossly symmetric. Motor: No spontaneous movement noted.  To noxious stimulation, forceful localization and withdrawal of the left upper extremity.  Weaker on the right upper extremity.  Similarly on the lower extremity, very brisk withdrawal in the left lower extremity but slower withdrawal in the right lower extremity. Sensory: As above DTR: 3+ and symmetric patellae and biceps.  Plantars downgoing bilaterally. Coordination: Cannot be assessed due to her mentation  Pertinent Labs: CBC    Component Value Date/Time   WBC 11.8 (H) 08/23/2020 1330   RBC 4.29 08/23/2020 1330   HGB 14.3 08/23/2020 1335   HGB 14.8 03/26/2015 0921   HCT 42.0 08/23/2020 1335   HCT 42.3 03/26/2015 0921   PLT 382 08/23/2020 1330   PLT 363 03/26/2015 0921   MCV 94.6 08/23/2020 1330   MCV 96 03/26/2015 0921   MCH 31.9 08/23/2020 1330   MCHC 33.7 08/23/2020 1330   RDW 11.9 08/23/2020 1330   RDW 12.5 03/26/2015 0921   LYMPHSABS 1.2 08/23/2020 1330   MONOABS 1.3 (H) 08/23/2020 1330   EOSABS 0.0 08/23/2020 1330   BASOSABS 0.0 08/23/2020 1330   CMP     Component Value Date/Time   NA  136 08/23/2020 1335   NA 135 (L) 03/26/2015 0921   K 4.2 08/23/2020 1335   CL 104 08/23/2020 1335   CO2 21 (L) 08/23/2020 1330   GLUCOSE 127 (H) 08/23/2020 1335   BUN 17 08/23/2020 1335   BUN 7 03/26/2015 0921   CREATININE 1.10 (H) 08/23/2020 1335   CALCIUM 9.4 08/23/2020 1330   PROT 7.5 08/23/2020 1330   PROT 7.7 03/26/2015 0921   ALBUMIN 3.7 08/23/2020 1330   ALBUMIN 4.4 03/26/2015 0921   AST 56 (H) 08/23/2020 1330   ALT 18 08/23/2020 1330   ALKPHOS 73 08/23/2020 1330   BILITOT 1.0 08/23/2020 1330   BILITOT 0.6 03/26/2015 0921   GFRNONAA 51 (L) 08/23/2020 1330   GFRAA >60 02/16/2020 0319   Lab Results  Component Value Date   TSH 1.955 08/23/2020   Lab Results  Component Value Date   CKTOTAL 1,002 (H) 08/23/2020   Drugs of Abuse     Component Value Date/Time   LABOPIA NONE DETECTED 08/23/2020 1729   COCAINSCRNUR POSITIVE (A) 08/23/2020 1729   LABBENZ NONE DETECTED 08/23/2020 1729   AMPHETMU NONE DETECTED 08/23/2020 1729   THCU NONE DETECTED 08/23/2020 1729   LABBARB NONE DETECTED 08/23/2020 1729    Urinalysis    Component Value Date/Time   COLORURINE YELLOW 08/23/2020 1729   APPEARANCEUR CLEAR 08/23/2020 1729   LABSPEC 1.042 (H) 08/23/2020 1729   PHURINE 7.0 08/23/2020 1729   GLUCOSEU NEGATIVE 08/23/2020 1729   HGBUR NEGATIVE 08/23/2020 1729   BILIRUBINUR  NEGATIVE 08/23/2020 1729   KETONESUR 5 (A) 08/23/2020 1729   PROTEINUR NEGATIVE 08/23/2020 1729   NITRITE NEGATIVE 08/23/2020 1729   LEUKOCYTESUR NEGATIVE 08/23/2020 1729   Imaging:  CT C-Spine: IMPRESSION: 1. Marked severity multilevel degenerative changes, most prominent at the levels of C4-C5, C5-C6 and C6-C7. 2. No acute cervical spine fracture.  MRI brain:  1. No acute intracranial abnormality. 2. Remote lacunar infarct in the bilateral centrum semiovale, corona radiata and right thalamus. 3. Advanced chronic microvascular ischemic changes. Mild parenchymal volume loss. 4. Small right mastoid  effusion.  CT/CT angio head and neck/CT cerebral perfusion: No acute intracranial hemorrhage or evidence of acute infarction. ASPECT score is 10. No large vessel occlusion or hemodynamically significant stenosis. Mild left mid M1 MCA stenosis. Perfusion imaging demonstrates no evidence of core infarction or penumbra. 8 mL of calculated penumbra in the bilateral anterior frontal lobes is likely artifactual.  EEG Overnight:  IMPRESSION: This study is suggestive of cortical dysfunction arising from lefthemispherelikely secondary to underlying structural abnormality, post-ictal state.No seizures ordefiniteepileptiform discharges were seen throughout the recording.  EEG appears to be improving compared to previous day..   Impression: 60 year old female with a history of status epilepticus, multiple remore infarcts, cocaine abuse, and alcohol abuse found unconscious by husband with shaking, jerking, and urinary incontinence multiple times from 4/6 - 4/7.  - Initial concern for stroke due to possible right facial droop, left gaze preference, and right-sided weakness. MRI and CT code stroke without evidence of acute infarct. Etiology most likely seizure versus status epilepticus.  - September 2021 EEG showed epileptogenicityand cortical dysfunctionarising from left hemisphere, maximal left posterior quadrant suggestive of underlying structural abnormality. This pattern is on the ictal-interictal continuum with higher potential for seizure recurrence.  She was diagnosed with status epilepticus, treated with keppra and vimpat and continued on discharge.  - She was given 2 mg Ativan, 2 g Keppra load, and 200 mg Vimpat in the ED and resumed on her home dosing of AEDs.  - Seizures likely provoked due to substance abuse versus medication noncompliance versus possible alcohol withdrawal seizure (serum alcohol <10 on admission) or multifactorial. UDS positive on admission for cocaine.   Today, she  continues to be drowsy and lethargic right side compared to the left-this has stayed stable-unclear of the baseline weakness if any.  Her EEG still shows left-sided cortical dysfunction without frank seizure activity and is improving from the prior day.  I will continue the EEG for 1 more day.  Recommendations: - Continue home keppra 1,500 mg BID and Vimpat 200 mg BID - Continue LTM EEG -although EEG from overnight is better than the prior night she is still does not appearing to be at her baseline. - Inpatient seizure precautions - Lifestyle modifications; counsel on cocaine and alcohol abstinence when receptive - PRN Ativan 2 mg IV for any seizure lasting > 5 minutes -Check CBC BMP-ordered Plan relayed to Dr. Blake Divine over secure chart. -- Milon Dikes, MD Neurologist [facility] Pager: [phone]

## 2020-08-25 NOTE — Procedures (Addendum)
Patient Name: Cindy [name]  MRN: [id number]  Epilepsy Attending: Charlsie Quest  Referring Physician/Provider: Dr Marvel Plan Duration: 08/24/2020 0730 to 08/24/2020 0730  Patient history: 60 yo F w possible seizure activity with left gaze deviation and shaking. EEG to evaluate for seizure.  Level of alertness: Awake, asleep  AEDs during EEG study: LEV, LCM  Technical aspects: This EEG study was done with scalp electrodes positioned according to the 10-20 International system of electrode placement. Electrical activity was acquired at a sampling rate of 500Hz  and reviewed with a high frequency filter of 70Hz  and a low frequency filter of 1Hz . EEG data were recorded continuously and digitally stored.   Description: The posterior dominant rhythm consists of 8 Hz activity of moderate voltage (25-35 uV) seen predominantly in posterior head regions, asymmetric ( L<R) and reactive to eye opening and eye closing. Drowsiness was characterized by attenuation of the posterior background rhythm. Sleep was characterized by vertex waves, sleep spindles (12 to 14 Hz), maximal frontocentral region. EEG showed continuous 3 to 6 Hz theta-delta slowing in left hemisphere. Sharp transients were seen in eft frontotemporal region.  Hyperventilation and photic stimulation were not performed.     ABNORMALITY - Continuous slow, Left hemisphere - Background asymmetry, left <right  IMPRESSION: This study is suggestive of cortical dysfunction arising from left hemisphere likely secondary to underlying structural abnormality, post-ictal state. No seizures or definite epileptiform discharges were seen throughout the recording.  EEG appears to be improving compared to previous day.   [name] 

## 2020-08-25 NOTE — Progress Notes (Signed)
Pt encouraged to open mouth for morning medications, patient refused, RN offered water by straw, patient accepted in mouth, refused to swallow. Oral medications not given for safety reasons. MD notified. Will continue to encourage and monitor.

## 2020-08-25 NOTE — Progress Notes (Signed)
PROGRESS NOTE    Cindy [name]  WUJ:[id number] DOB: Jun 26, 1960 DOA: 08/23/2020 PCP: Avon GullyFanta, Tesfaye, MD    Chief Complaint  Patient presents with  . Code Stroke    Brief Narrative:  60 year old lady with prior h/o hypertension, hyperlipidemia, CVA, seizure, with prior h/o status epilepticus, polysubstance abuse ( use of cocaine and alcohol) admitted for altered mental status.   The patient's husband called EMS after finding her acutely altered, unresponsive with urinary incontinence, and reports of shaking/jerking episodes lasting 5 to 10 minutes on the floor since last night around 9 PM.  After each episode patient was reported to be better, but still have slurred speech and was not able to get up. Upon admission to the emergency department patient was seen as a code stroke and evaluated by neurology.  CT scan of the head was negative for any acute abnormalities.  CTA of the head neck showed no large vessel occlusion.  Neurology consulted, recommended continuous EEG and increase in  keppra and continue with vimpat.  Tried to contact family , unable to reach the spouse or the brother.   Assessment & Plan:   Active Problems:   HYPERCHOLESTEROLEMIA   Essential hypertension   Elevated AST (SGOT)   Seizures (HCC)   SIRS (systemic inflammatory response syndrome) (HCC)   Acute metabolic encephalopathy   Polysubstance abuse (HCC)   AKI (acute kidney injury) (HCC)   History of stroke   Malnutrition of moderate degree   Acute Metabolic encephalopathy: pt at baseline is alert and oriented, she continues to remain lethargic, not following commands and confused. - Probably secondary to seizures / status epilepticus. MRI is negative for acute stroke.  CTA of the head and neck is negative for large vessel occlusion.  Continuous EEG monitoring.  Neurology on board and appreciate recommendations.  Lactic acid is wnl.  RPR and HIV screen is negative.  TSH wnl.  MRI shows Remote lacunar  infarct in the bilateral centrum semiovale, corona radiata and right thalamus. Advanced chronic microvascular ischemic changes. Mild parenchymal volume loss, which might explain her right-sided weakness.  Continue with aspirin 325 mg daily. Continue with vimpat and keppra and ativan PRN.  Continue with seizure precautions.  Neurology recommended to continue with continuous EEG for another 24 hours. Palliative care consulted as patient remains lethargic, minimal oral intake.   Hypertension:  Blood pressure parameters are optimal at this time.  Malnutrition of moderate degree:  Nutrition consulted.     Rhabdomyolysis:  Probably secondary to seizures/ status epilepticus.  Continue with IV fluids.  Repeat CK levels in the morning.   UDS is positive for cocaine,  SW consult for resources.   H/o alcohol abuse:  No signs of withdrawal.  Continue with CIWA protocol.   GERD:  Continue with PPI.    Hypokalemia Replaced       DVT prophylaxis: SCD'S Code Status: full code.  Family Communication: none at bedside.  Disposition:   Status is: Inpatient  Remains inpatient appropriate because:Ongoing diagnostic testing needed not appropriate for outpatient work up   Dispo: The patient is from: Home              Anticipated d/c is to: pending.               Patient currently is not medically stable to d/c.   Difficult to place patient No       Consultants:   NEUROLOGY.    Procedures: EEG   Antimicrobials: none.  Subjective: Patient continues to be lethargic not following commands.  Objective: Vitals:   08/24/20 1943 08/24/20 2359 08/25/20 0335 08/25/20 0855  BP: 120/85 114/69 120/75 (!) 147/90  Pulse: 89 92 81 89  Resp: 19 20 20    Temp: 98.2 F (36.8 C) 97.9 F (36.6 C) 97.6 F (36.4 C) 98 F (36.7 C)  TempSrc: Oral Oral Oral Oral  SpO2: 96% 99% 98% 100%  Weight:        Intake/Output Summary (Last 24 hours) at 08/25/2020 1101 Last data filed at  08/25/2020 10/25/2020 Gross per 24 hour  Intake 690.78 ml  Output 500 ml  Net 190.78 ml   Filed Weights   08/23/20 1300  Weight: 56.6 kg    Examination:  General exam: Elderly female, on continuous EEG, does not appear to be in any distress Respiratory system: Air entry fair, no wheezing or rhonchi Cardiovascular system: S1-S2 heard, regular rate rhythm, no JVD, no pedal edema Gastrointestinal system: Abdomen is soft nontender bowel sounds normal Central nervous system: Lethargic, not following commands, Extremities: No pedal edema Skin: No rashes seen Psychiatry: Cannot be assessed    Data Reviewed: I have personally reviewed following labs and imaging studies  CBC: Recent Labs  Lab 08/23/20 1330 08/23/20 1335 08/25/20 0931  WBC 11.8*  --  9.2  NEUTROABS 9.2*  --  7.2  HGB 13.7 14.3 13.2  HCT 40.6 42.0 38.9  MCV 94.6  --  93.7  PLT 382  --  304    Basic Metabolic Panel: Recent Labs  Lab 08/23/20 1330 08/23/20 1335 08/24/20 0330 08/25/20 0931  NA 135 136  --  135  K 4.2 4.2  --  3.1*  CL 105 104  --  106  CO2 21*  --   --  21*  GLUCOSE 127* 127*  --  105*  BUN 13 17  --  8  CREATININE 1.21* 1.10*  --  0.87  CALCIUM 9.4  --   --  8.7*  MG  --   --  1.9  --   PHOS  --   --  3.0  --     GFR: CrCl cannot be calculated (Unknown ideal weight.).  Liver Function Tests: Recent Labs  Lab 08/23/20 1330  AST 56*  ALT 18  ALKPHOS 73  BILITOT 1.0  PROT 7.5  ALBUMIN 3.7    CBG: Recent Labs  Lab 08/23/20 1329  GLUCAP 126*     Recent Results (from the past 240 hour(s))  Resp Panel by RT-PCR (Flu A&B, Covid) Nasopharyngeal Swab     Status: None   Collection Time: 08/23/20  5:09 PM   Specimen: Nasopharyngeal Swab; Nasopharyngeal(NP) swabs in vial transport medium  Result Value Ref Range Status   SARS Coronavirus 2 by RT PCR NEGATIVE NEGATIVE Final    Comment: (NOTE) SARS-CoV-2 target nucleic acids are NOT DETECTED.  The SARS-CoV-2 RNA is generally  detectable in upper respiratory specimens during the acute phase of infection. The lowest concentration of SARS-CoV-2 viral copies this assay can detect is 138 copies/mL. A negative result does not preclude SARS-Cov-2 infection and should not be used as the sole basis for treatment or other patient management decisions. A negative result may occur with  improper specimen collection/handling, submission of specimen other than nasopharyngeal swab, presence of viral mutation(s) within the areas targeted by this assay, and inadequate number of viral copies(<138 copies/mL). A negative result must be combined with clinical observations, patient history, and epidemiological information. The expected  result is Negative.  Fact Sheet for Patients:  BloggerCourse.com  Fact Sheet for Healthcare Providers:  SeriousBroker.it  This test is no t yet approved or cleared by the [place] FDA and  has been authorized for detection and/or diagnosis of SARS-CoV-2 by FDA under an Emergency Use Authorization (EUA). This EUA will remain  in effect (meaning this test can be used) for the duration of the COVID-19 declaration under Section 564(b)(1) of the Act, 21 U.S.C.section 360bbb-3(b)(1), unless the authorization is terminated  or revoked sooner.       Influenza A by PCR NEGATIVE NEGATIVE Final   Influenza B by PCR NEGATIVE NEGATIVE Final    Comment: (NOTE) The Xpert Xpress SARS-CoV-2/FLU/RSV plus assay is intended as an aid in the diagnosis of influenza from Nasopharyngeal swab specimens and should not be used as a sole basis for treatment. Nasal washings and aspirates are unacceptable for Xpert Xpress SARS-CoV-2/FLU/RSV testing.  Fact Sheet for Patients: BloggerCourse.com  Fact Sheet for Healthcare Providers: SeriousBroker.it  This test is not yet approved or cleared by the [place] FDA  and has been authorized for detection and/or diagnosis of SARS-CoV-2 by FDA under an Emergency Use Authorization (EUA). This EUA will remain in effect (meaning this test can be used) for the duration of the COVID-19 declaration under Section 564(b)(1) of the Act, 21 U.S.C. section 360bbb-3(b)(1), unless the authorization is terminated or revoked.  Performed at [facility], [address], [place], [place] [address]          Radiology Studies: MR BRAIN W WO CONTRAST  Result Date: 08/23/2020 CLINICAL DATA:  Stroke follow-up. EXAM: MRI HEAD WITHOUT AND WITH CONTRAST TECHNIQUE: Multiplanar, multiecho pulse sequences of the brain and surrounding structures were obtained without and with intravenous contrast. CONTRAST:  6mL GADAVIST GADOBUTROL 1 MMOL/ML IV SOLN COMPARISON:  Head CT August 23, 2020. FINDINGS: Brain: No acute infarction, hemorrhage, hydrocephalus, extra-axial collection or mass lesion. Remote lacunar infarct in the bilateral centrum semiovale, corona radiata and right thalamus. Scattered confluent foci of T2 hyperintensity are seen within the white matter of the cerebral hemispheres, nonspecific. Mild parenchymal volume loss. No focus of abnormal contrast enhancement. Vascular: Normal flow voids. Skull and upper cervical spine: Normal marrow signal. Sinuses/Orbits: Negative. Other: Small right mastoid effusion. IMPRESSION: 1. No acute intracranial abnormality. 2. Remote lacunar infarct in the bilateral centrum semiovale, corona radiata and right thalamus. 3. Advanced chronic microvascular ischemic changes. Mild parenchymal volume loss. 4. Small right mastoid effusion. Electronically Signed   By: Baldemar Lenis M.D.   On: 08/23/2020 15:18   CT C-SPINE NO CHARGE  Result Date: 08/23/2020 CLINICAL DATA:  Status post fall. EXAM: CT CERVICAL SPINE WITHOUT CONTRAST TECHNIQUE: Multidetector CT imaging of the cervical spine was performed without intravenous contrast.  Multiplanar CT image reconstructions were also generated. COMPARISON:  August 02, 2008 FINDINGS: Alignment: Approximately 1 mm to 2 mm retrolisthesis of the C4 vertebral body is seen on C5 and C5 vertebral body on C6. Skull base and vertebrae: No acute fracture. No primary bone lesion or focal pathologic process. Soft tissues and spinal canal: No prevertebral fluid or swelling. No visible canal hematoma. Disc levels: Marked severity endplate sclerosis and osteophyte formation is seen at the levels of C4-C5, C5-C6 and C6-C7. Marked severity intervertebral disc space narrowing is also seen at the levels of C4-C5, C5-C6 and C6-C7. Moderate to marked severity bilateral multilevel facet joint hypertrophy is noted. Upper chest: Negative. Other: None. IMPRESSION: 1. Marked severity  multilevel degenerative changes, most prominent at the levels of C4-C5, C5-C6 and C6-C7. 2. No acute cervical spine fracture. Electronically Signed   By: Aram Candela M.D.   On: 08/23/2020 16:30   DG CHEST PORT 1 VIEW  Result Date: 08/23/2020 CLINICAL DATA:  Seizure EXAM: PORTABLE CHEST 1 VIEW COMPARISON:  02/09/2020 FINDINGS: Heart is borderline in size. Lungs are clear. No effusions or acute bony abnormality. IMPRESSION: No active disease. Electronically Signed   By: Charlett Nose M.D.   On: 08/23/2020 18:28   Overnight EEG with video  Result Date: 08/24/2020 Charlsie Quest, MD     08/25/2020  3:55 AM Patient Name: Cindy [name] MRN: [id number] Epilepsy Attending: Charlsie Quest Referring Physician/Provider: Dr Marvel Plan Duration: 08/23/2020 1601 to 08/24/2020 0730 Patient history: 60 yo F w possible seizure activity with left gaze deviation and shaking. EEG to evaluate for seizure. Level of alertness: Awake, asleep AEDs during EEG study: LEV, LCM Technical aspects: This EEG study was done with scalp electrodes positioned according to the 10-20 International system of electrode placement. Electrical activity was acquired at a sampling  rate of 500Hz  and reviewed with a high frequency filter of 70Hz  and a low frequency filter of 1Hz . EEG data were recorded continuously and digitally stored. Description: The posterior dominant rhythm consists of 8 Hz activity of moderate voltage (25-35 uV) seen predominantly in posterior head regions, asymmetric ( L<R) and reactive to eye opening and eye closing. Drowsiness was characterized by attenuation of the posterior background rhythm. Sleep was characterized by vertex waves, sleep spindles (12 to 14 Hz), maximal right frontocentral region. EEG showed continuous 3 to 6 Hz theta-delta slowing in left heisphere. Sharp transients were seen in eft occipital region.  Hyperventilation and photic stimulation were not performed.   ABNORMALITY - Continuous slow, Left hemisphere - Background asymmetry, left <right - Sleep asymmetry, Left<right IMPRESSION: This study is suggestive of cortical dysfunction arising from left hemisphere likely secondary to underlying structural abnormality, post-ictal state. No seizures or definite epileptiform discharges were seen throughout the recording.   CT HEAD CODE STROKE WO CONTRAST  Result Date: 08/23/2020 CLINICAL DATA:  Code stroke.  Right-sided weakness EXAM: CT HEAD WITHOUT CONTRAST TECHNIQUE: Contiguous axial images were obtained from the base of the skull through the vertex without intravenous contrast. COMPARISON:  02/09/2020 FINDINGS: Brain: No acute intracranial hemorrhage, mass effect, or edema. No definite new loss of gray-white differentiation. A small area hypoattenuation along the anterior left insula appears to be through an area of artifact. There are chronic infarcts of the right putamen and thalamus. Additional patchy and confluent areas of hypoattenuation in the supratentorial white matter are nonspecific but probably reflect similar moderate chronic microvascular ischemic changes. Prominence of the ventricles and sulci reflects similar  parenchymal volume loss. Vascular: No hyperdense vessel. There is intracranial atherosclerotic calcification at the skull base. Skull: Unremarkable. Sinuses/Orbits: No acute abnormality. Other: Mastoid air cells are clear. ASPECTS ([place] Stroke Program Early CT Score) - Ganglionic level infarction (caudate, lentiform nuclei, internal capsule, insula, M1-M3 cortex): 7 - Supraganglionic infarction (M4-M6 cortex): 3 Total score (0-10 with 10 being normal): 10 IMPRESSION: There is no acute intracranial hemorrhage or evidence of acute infarction. ASPECT score is 10. These results were communicated to Dr. Charlsie Quest at 1:45 pm on 08/23/2020 by text page via the [facility] messaging system. Electronically Signed   By: Roda Shutters M.D.   On: 08/23/2020 13:47   CT ANGIO HEAD NECK W WO CM  W PERF (CODE STROKE)  Result Date: 08/23/2020 CLINICAL DATA:  Right-sided weakness EXAM: CT HEAD WITHOUT CONTRAST CT ANGIOGRAPHY HEAD AND NECK CT PERFUSION BRAIN TECHNIQUE: Multidetector CT imaging of the head and neck was performed using the standard protocol during bolus administration of intravenous contrast. Multiplanar CT image reconstructions and MIPs were obtained to evaluate the vascular anatomy. Carotid stenosis measurements (when applicable) are obtained utilizing NASCET criteria, using the distal internal carotid diameter as the denominator. Multiphase CT imaging of the brain was performed following IV bolus contrast injection. Subsequent parametric perfusion maps were calculated using RAPID software. CONTRAST:  OMNIPAQUE IOHEXOL 350 MG/ML SOLN COMPARISON:  None. FINDINGS: CTA NECK FINDINGS Aortic arch: Great vessel origins are patent. Right carotid system: Patent. Mild noncalcified plaque at the ICA origin causing minimal stenosis. Left carotid system: Patent. Mild mixed plaque at the ICA origin without stenosis. Vertebral arteries: Patent. Right vertebral artery is dominant. No stenosis. Skeleton: Degenerative changes of the  cervical spine. Other neck: Negative. Upper chest: No apical lung mass. Review of the MIP images confirms the above findings CTA HEAD FINDINGS Anterior circulation: Intracranial internal carotid arteries are patent with mild calcified plaque. Anterior and middle cerebral arteries are patent. Mild stenosis of the mid left M1 MCA. Posterior circulation: Intracranial vertebral arteries are patent with mild calcified plaque. Basilar artery is patent. Major cerebellar artery origins are patent. Posterior cerebral arteries are patent. A right posterior communicating artery is present. Venous sinuses: As permitted by contrast timing, patent. Review of the MIP images confirms the above findings CT Brain Perfusion Findings: CBF (<30%) Volume: 67mL Perfusion (Tmax>6.0s) volume: 51mL Mismatch Volume: 3mL, likely artifactual. Infarction Location:  None. IMPRESSION: No large vessel occlusion or hemodynamically significant stenosis. Mild left mid M1 MCA stenosis. Perfusion imaging demonstrates no evidence of core infarction or penumbra. 8 mL of calculated penumbra in the bilateral anterior frontal lobes is likely artifactual. Electronically Signed   By: Guadlupe Spanish M.D.   On: 08/23/2020 14:09        Scheduled Meds: . aspirin  325 mg Oral Daily  . enoxaparin (LOVENOX) injection  40 mg Subcutaneous Q24H  . feeding supplement  237 mL Oral TID with meals  . folic acid  1 mg Oral Daily  . multivitamin with minerals  1 tablet Oral Daily  . pantoprazole (PROTONIX) IV  40 mg Intravenous Q24H  . thiamine  100 mg Oral Daily   Or  . thiamine  100 mg Intravenous Daily   Continuous Infusions: . sodium chloride 75 mL/hr (08/25/20 0100)  . sodium chloride Stopped (08/24/20 2252)  . lacosamide (VIMPAT) IV Stopped (08/24/20 2350)  . levETIRAcetam Stopped (08/24/20 2212)  . potassium chloride       LOS: 2 days       Kathlen Mody, MD [facility]   To contact the attending provider between 7A-7P or the  covering provider during after hours 7P-7A, please log into the web site [email or web address] and access using universal [place] password for that web site. If you do not have the password, please call the hospital operator.  08/25/2020, 11:01 AM

## 2020-08-26 DIAGNOSIS — Z66 Do not resuscitate: Secondary | ICD-10-CM

## 2020-08-26 LAB — BASIC METABOLIC PANEL
Anion gap: 9 (ref 5–15)
BUN: 6 mg/dL — ABNORMAL LOW (ref 8–23)
CO2: 22 mmol/L (ref 22–32)
Calcium: 8.7 mg/dL — ABNORMAL LOW (ref 8.9–10.3)
Chloride: 104 mmol/L (ref 98–111)
Creatinine, Ser: 0.92 mg/dL (ref 0.44–1.00)
GFR, Estimated: >60 mL/min (ref >60)
Glucose, Bld: 106 mg/dL — ABNORMAL HIGH (ref 70–99)
Potassium: 3.1 mmol/L — ABNORMAL LOW (ref 3.5–5.1)
Sodium: 135 mmol/L (ref 135–145)

## 2020-08-26 LAB — CK: Total CK: 415 U/L — ABNORMAL HIGH (ref 38–234)

## 2020-08-26 LAB — GLUCOSE, CAPILLARY: Glucose-Capillary: 97 mg/dL (ref 70–99)

## 2020-08-26 MED ORDER — ASPIRIN 300 MG RE SUPP
300.0000 mg | Freq: Every day | RECTAL | Status: DC
  Administered 2020-08-27: 300 mg via RECTAL
  Filled 2020-08-26: qty 1

## 2020-08-26 MED ORDER — POTASSIUM CHLORIDE 10 MEQ/100ML IV SOLN
INTRAVENOUS | Status: AC
  Administered 2020-08-26: 10 meq via INTRAVENOUS
  Filled 2020-08-26: qty 100

## 2020-08-26 MED ORDER — POTASSIUM CHLORIDE 10 MEQ/100ML IV SOLN
10.0000 meq | INTRAVENOUS | Status: AC
  Administered 2020-08-26: 10 meq via INTRAVENOUS
  Filled 2020-08-26: qty 100

## 2020-08-26 MED ORDER — HALOPERIDOL LACTATE 5 MG/ML IJ SOLN
2.0000 mg | Freq: Four times a day (QID) | INTRAMUSCULAR | Status: DC | PRN
  Administered 2020-08-27 – 2020-08-30 (×5): 2 mg via INTRAVENOUS
  Filled 2020-08-26 (×5): qty 1

## 2020-08-26 MED ORDER — HALOPERIDOL LACTATE 5 MG/ML IJ SOLN: 2.0000 mg | Freq: Four times a day (QID) | INTRAMUSCULAR | Status: DC | PRN

## 2020-08-26 MED ORDER — DEXTROSE-NACL 5-0.9 % IV SOLN: INTRAVENOUS | Status: DC

## 2020-08-26 MED ORDER — POTASSIUM CHLORIDE CRYS ER 20 MEQ PO TBCR
40.0000 meq | EXTENDED_RELEASE_TABLET | Freq: Once | ORAL | Status: DC
  Filled 2020-08-26: qty 2

## 2020-08-26 NOTE — Progress Notes (Signed)
Inpatient Rehab Admissions Coordinator Note:   Per therapy recommendations, pt was screened for CIR candidacy by Kylan Veach, MS CCC-SLP. At this time, Pt. Appears to have functional decline and is a potential candidate for CIR. Will place order for rehab consult per protocol. I will review case with rehab MD tomorrow morning to confirm that  Pt. Is appropriate for CIR.    Please contact me with questions.   Bart Ashford, MS, CCC-SLP Rehab Admissions Coordinator  [phone] (celll) [phone] (office)   

## 2020-08-26 NOTE — Progress Notes (Signed)
   08/26/20 2010  Assess: MEWS Score  Temp 100.3 F (37.9 C)  BP (!) 146/96  Pulse Rate 100  ECG Heart Rate (!) 119  Resp 20  Level of Consciousness Alert  SpO2 100 %  O2 Device Room Air  Assess: MEWS Score  MEWS Temp 0  MEWS Systolic 0  MEWS Pulse 2  MEWS RR 0  MEWS LOC 0  MEWS Score 2  MEWS Score Color Yellow  Assess: if the MEWS score is Yellow or Red  Were vital signs taken at a resting state? Yes  Focused Assessment Change from prior assessment (see assessment flowsheet)  Early Detection of Sepsis Score *See Row Information* Medium  MEWS guidelines implemented *See Row Information* Yes  Treat  MEWS Interventions Administered prn meds/treatments  Pain Scale FLACC  Pain Intervention(s) Medication (See eMAR)  Breathing 0  Negative Vocalization 1  Facial Expression 0  Body Language 0  Consolability 0  PAINAD Score 1  Facial Expression 0  Body Movements 0  Muscle Tension 0  Take Vital Signs  Increase Vital Sign Frequency  Yellow: Q 2hr X 2 then Q 4hr X 2, if remains yellow, continue Q 4hrs  Escalate  MEWS: Escalate Yellow: discuss with charge nurse/RN and consider discussing with provider and RRT  Notify: Charge Nurse/RN  Name of Charge Nurse/RN Notified Marcy Salvo  Date Charge Nurse/RN Notified 08/26/20  Time Charge Nurse/RN Notified 2030  Document  Patient Outcome Stabilized after interventions  Progress note created (see row info) Yes

## 2020-08-26 NOTE — Evaluation (Signed)
Physical Therapy Evaluation Patient Details Name: Cindy [name] MRN: [id number] DOB: Oct 31, 1960 Today's Date: 08/26/2020   History of Present Illness  60 yo female admitted with AMS .Pt with HTN and polysubstance abuse on arrival.  Current workup with L hemisphere seizure PMH HTN HLD CVA seizures Polysubstance  Clinical Impression  Pt admitted with above complications. Very lethargic, a bit agitated when more aroused later in therapy session, poor ability to follow commands. Mod assist+2 at best with bed mobility and sit<>stand. Poor postural control, leaning left and anteriorly when seated; intermittently impulsive. Pt currently with functional limitations due to the deficits listed below (see PT Problem List). Pt will benefit from skilled PT to increase their independence and safety with mobility to allow discharge to the venue listed below.       Follow Up Recommendations CIR    Equipment Recommendations   (TBD next venue of care)    Recommendations for Other Services Rehab consult     Precautions / Restrictions Precautions Precautions: Fall Precaution Comments: elbow block splint over IV to protect it in R UE Restrictions Weight Bearing Restrictions: No      Mobility  Bed Mobility Overal bed mobility: Needs Assistance Bed Mobility: Rolling;Supine to Sit;Sit to Supine Rolling: Mod assist;+2 for physical assistance   Supine to sit: Mod assist;+2 for physical assistance Sit to supine: Mod assist;+2 for physical assistance   General bed mobility comments: Mod assist rolling and with trunk support to rise, LE and trunk support to return to bed with +2 assist upon sitting due to impaired postural control, impulsiveness leaning forward heavily at times. Assist managing multiple lines/leads. Cues for initating mobility with minimal response.    Transfers Overall transfer level: Needs assistance Equipment used: 2 person hand held assist Transfers: Sit to/from Stand Sit to  Stand: Mod assist;+2 physical assistance         General transfer comment: Mod assist +2 for boost and balance to stand x2 from bed. Initially with Rt knee block, showing some improved control on Lt, pt able to tolerate standing short period of time however issues raising her head and extending hips into full upright posture. Leaning towards left, sits abruptly with assist for control.  Ambulation/Gait                Stairs            Wheelchair Mobility    Modified Rankin (Stroke Patients Only) Modified Rankin (Stroke Patients Only) Pre-Morbid Rankin Score: Slight disability (Estimated based on prior notes) Modified Rankin: Severe disability     Balance Overall balance assessment: Needs assistance Sitting-balance support: Single extremity supported;Feet supported Sitting balance-Leahy Scale: Zero Sitting balance - Comments: impulsive at times. Falling forward, grabbing at gown and leads.     Standing balance-Leahy Scale: Zero Standing balance comment: Progressed to poor  when more arousable.                             Pertinent Vitals/Pain Pain Assessment: Faces Faces Pain Scale: Hurts little more Pain Location: unable to verbalize Pain Descriptors / Indicators: Grimacing Pain Intervention(s): Limited activity within patient's tolerance;Monitored during session;Repositioned    Home Living Family/patient expects to be discharged to:: Unsure                 Additional Comments: Pt poor historian - family not immediately available for dispo planning    Prior Function Level of Independence: Independent with assistive device(s);Needs  assistance (History obtained from prior admission)   Gait / Transfers Assistance Needed: household ambulator using RW (Hx obtained from prior admission)  ADL's / Homemaking Assistance Needed: assisted by family (Hx obtained from prior admission)  Comments: Hx obtained from prior admission - pt unable to report  at this time     Hand Dominance   Dominant Hand: Right    Extremity/Trunk Assessment   Upper Extremity Assessment Upper Extremity Assessment: RUE deficits/detail;LUE deficits/detail RUE Deficits / Details: active movement noted but unable to hold against gravity. pt with decrease sensation and awareness of arm in space RUE Sensation: decreased proprioception;decreased light touch RUE Coordination: decreased gross motor;decreased fine motor LUE Deficits / Details: pushing with L UE, not following commands with hand, pt does reach for rail with hand over hand placement, pt able to reach against gravity LUE Coordination: decreased fine motor;decreased gross motor    Lower Extremity Assessment Lower Extremity Assessment: Defer to PT evaluation;RLE deficits/detail RLE Deficits / Details: decreased sensation compared to painful stimuli withdrawal to LLE    Cervical / Trunk Assessment Cervical / Trunk Assessment: Kyphotic;Other exceptions (unable to hold head against gravity into extension)  Communication   Communication: Expressive difficulties;Other (comment) (limited assessment due to AMS)  Cognition Arousal/Alertness: Lethargic Behavior During Therapy: Flat affect;Impulsive (becomes a bit agitated and restless as her arousal level increases) Overall Cognitive Status: No family/caregiver present to determine baseline cognitive functioning                                 General Comments: Minimally verbal, occasionally repeating questions asked by therapists.      General Comments General comments (skin integrity, edema, etc.): VSS, BP obtained with minimal movement in R UE, Pt noted on initial arrival to have IV out of hand. RN addressed prior to session    Exercises     Assessment/Plan    PT Assessment Patient needs continued PT services  PT Problem List Decreased strength;Decreased range of motion;Decreased activity tolerance;Decreased balance;Decreased  coordination;Decreased mobility;Decreased cognition;Decreased knowledge of use of DME;Decreased safety awareness;Decreased knowledge of precautions       PT Treatment Interventions      PT Goals (Current goals can be found in the Care Plan section)  Acute Rehab PT Goals Patient Stated Goal: none stated PT Goal Formulation: Patient unable to participate in goal setting Time For Goal Achievement: 09/09/20 Potential to Achieve Goals: Fair    Frequency Min 4X/week   Barriers to discharge        Co-evaluation PT/OT/SLP Co-Evaluation/Treatment: Yes Reason for Co-Treatment: Complexity of the patient's impairments (multi-system involvement);Necessary to address cognition/behavior during functional activity;For patient/therapist safety;To address functional/ADL transfers PT goals addressed during session: Mobility/safety with mobility;Balance;Proper use of DME;Strengthening/ROM OT goals addressed during session: ADL's and self-care;Proper use of Adaptive equipment and DME;Strengthening/ROM       AM-PAC PT "6 Clicks" Mobility  Outcome Measure Help needed turning from your back to your side while in a flat bed without using bedrails?: A Lot Help needed moving from lying on your back to sitting on the side of a flat bed without using bedrails?: A Lot Help needed moving to and from a bed to a chair (including a wheelchair)?: A Lot Help needed standing up from a chair using your arms (e.g., wheelchair or bedside chair)?: A Lot Help needed to walk in hospital room?: Total Help needed climbing 3-5 steps with a railing? : Total 6 Click  Score: 10    End of Session Equipment Utilized During Treatment: Gait belt Activity Tolerance: Treatment limited secondary to agitation (and low arousal levels) Patient left: in bed;with call bell/phone within reach;with bed alarm set   PT Visit Diagnosis: Muscle weakness (generalized) (M62.81);Unsteadiness on feet (R26.81);History of falling  (Z91.81);Difficulty in walking, not elsewhere classified (R26.2);Other symptoms and signs involving the nervous system (R29.898)    Time: 9735-3299 PT Time Calculation (min) (ACUTE ONLY): 17 min   Charges:   PT Evaluation $PT Eval High Complexity: 1 High          [address] [place], PT   [place] Mount 08/26/2020, 11:10 AM

## 2020-08-26 NOTE — Progress Notes (Signed)
PROGRESS NOTE    Cindy [name]  STM:[id number] DOB: [birth year] DOA: 08/23/2020 PCP: Avon Gully, MD    Chief Complaint  Patient presents with  . Code Stroke    Brief Narrative:  60 year old lady with prior h/o hypertension, hyperlipidemia, CVA, seizure, with prior h/o status epilepticus, polysubstance abuse ( use of cocaine and alcohol) admitted for altered mental status.   The patient's husband called EMS after finding her acutely altered, unresponsive with urinary incontinence, and reports of shaking/jerking episodes lasting 5 to 10 minutes on the floor since last night around 9 PM.  After each episode patient was reported to be better, but still have slurred speech and was not able to get up. Upon admission to the emergency department patient was seen as a code stroke and evaluated by neurology.  CT scan of the head was negative for any acute abnormalities.  CTA of the head neck showed no large vessel occlusion. Pt is on continous EEG for 48 hours, . She remains lethargic, barely opening eyes on verbal cues. As per RN, pt will wake briefly , get agitated and try to remove the leads.  No oral intake int he last 48 hours.     Assessment & Plan:   Active Problems:   HYPERCHOLESTEROLEMIA   Essential hypertension   Elevated AST (SGOT)   Seizures (HCC)   SIRS (systemic inflammatory response syndrome) (HCC)   Acute metabolic encephalopathy   Polysubstance abuse (HCC)   AKI (acute kidney injury) (HCC)   History of stroke   Malnutrition of moderate degree   Acute Metabolic encephalopathy: pt at baseline is alert and oriented, she continues to remain lethargic, not following commands and confused. - Probably secondary to seizures / status epilepticus. MRI is negative for acute stroke.  CTA of the head and neck is negative for large vessel occlusion.  Continuous EEG monitoring for 48 hours as per neurology.  Lactic acid is wnl.  RPR and HIV screen is negative.  TSH wnl.  MRI shows  Remote lacunar infarct in the bilateral centrum semiovale, corona radiata and right thalamus. Advanced chronic microvascular ischemic changes. Mild parenchymal volume loss, which might explain her right-sided weakness.  Continue with aspirin 325 mg daily. Continue with vimpat and keppra and ativan PRN.  Continue with seizure precautions.   Palliative care consulted as patient remains lethargic, minimal oral intake. On exam today, she remains lethargic, minimally responsive.  Therapy evaluations ordered.   Hypertension:  BP parameters are optimal.   Malnutrition of moderate degree:  Nutrition consulted.     Rhabdomyolysis:  Probably secondary to seizures/ status epilepticus.  Continue with IV fluids.  Repeat CK levels in the morning show improvement.    UDS is positive for cocaine,  SW consult for resources.   H/o alcohol abuse:  No signs of withdrawal.  Continue with CIWA protocol.   GERD:  Continue with PPI.    Hypokalemia Replaced, repeat level low.    DVT prophylaxis: SCD'S Code Status: full code.  Family Communication: spoke with husband at bedside.  Disposition:   Status is: Inpatient  Remains inpatient appropriate because:Ongoing diagnostic testing needed not appropriate for outpatient work up   Dispo: The patient is from: Home              Anticipated d/c is to: pending.               Patient currently is not medically stable to d/c.   Difficult to place patient No  Consultants:   NEUROLOGY.    Procedures: EEG   Antimicrobials: none.    Subjective: Not following commands, lethargic.   Objective: Vitals:   08/25/20 1949 08/25/20 2346 08/26/20 0403 08/26/20 0801  BP:  138/89 129/83 122/81  Pulse:  (!) 115 88 84  Resp:  20 20 16   Temp:  98.9 F (37.2 C) 98.5 F (36.9 C) 99 F (37.2 C)  TempSrc:  Oral Oral Oral  SpO2: 98% 100% 100% 100%  Weight:        Intake/Output Summary (Last 24 hours) at 08/26/2020 1554 Last data  filed at 08/26/2020 0555 Gross per 24 hour  Intake 999.31 ml  Output 1150 ml  Net -150.69 ml   Filed Weights   08/23/20 1300  Weight: 56.6 kg    Examination:  General exam: Elderly lady, disheveled in appearance, lethargic,  Respiratory system:air entry fair, no wheezing heard.  Cardiovascular system:S1S2, RRR, no JVD,  Gastrointestinal system: Abdomen is soft, nt nd bs+ Central nervous system:somnolent, not following commands. Lethargic.  Extremities: no pedal edema.  Skin: No rashes seen Psychiatry: Cannot be assessed    Data Reviewed: I have personally reviewed following labs and imaging studies  CBC: Recent Labs  Lab 08/23/20 1330 08/23/20 1335 08/25/20 0931  WBC 11.8*  --  9.2  NEUTROABS 9.2*  --  7.2  HGB 13.7 14.3 13.2  HCT 40.6 42.0 38.9  MCV 94.6  --  93.7  PLT 382  --  304    Basic Metabolic Panel: Recent Labs  Lab 08/23/20 1330 08/23/20 1335 08/24/20 0330 08/25/20 0931 08/26/20 0247  NA 135 136  --  135 135  K 4.2 4.2  --  3.1* 3.1*  CL 105 104  --  106 104  CO2 21*  --   --  21* 22  GLUCOSE 127* 127*  --  105* 106*  BUN 13 17  --  8 6*  CREATININE 1.21* 1.10*  --  0.87 0.92  CALCIUM 9.4  --   --  8.7* 8.7*  MG  --   --  1.9  --   --   PHOS  --   --  3.0  --   --     GFR: CrCl cannot be calculated (Unknown ideal weight.).  Liver Function Tests: Recent Labs  Lab 08/23/20 1330  AST 56*  ALT 18  ALKPHOS 73  BILITOT 1.0  PROT 7.5  ALBUMIN 3.7    CBG: Recent Labs  Lab 08/23/20 1329  GLUCAP 126*     Recent Results (from the past 240 hour(s))  Resp Panel by RT-PCR (Flu A&B, Covid) Nasopharyngeal Swab     Status: None   Collection Time: 08/23/20  5:09 PM   Specimen: Nasopharyngeal Swab; Nasopharyngeal(NP) swabs in vial transport medium  Result Value Ref Range Status   SARS Coronavirus 2 by RT PCR NEGATIVE NEGATIVE Final    Comment: (NOTE) SARS-CoV-2 target nucleic acids are NOT DETECTED.  The SARS-CoV-2 RNA is generally  detectable in upper respiratory specimens during the acute phase of infection. The lowest concentration of SARS-CoV-2 viral copies this assay can detect is 138 copies/mL. A negative result does not preclude SARS-Cov-2 infection and should not be used as the sole basis for treatment or other patient management decisions. A negative result may occur with  improper specimen collection/handling, submission of specimen other than nasopharyngeal swab, presence of viral mutation(s) within the areas targeted by this assay, and inadequate number of viral copies(<138 copies/mL). A negative  result must be combined with clinical observations, patient history, and epidemiological information. The expected result is Negative.  Fact Sheet for Patients:  BloggerCourse.com  Fact Sheet for Healthcare Providers:  SeriousBroker.it  This test is no t yet approved or cleared by the [place] FDA and  has been authorized for detection and/or diagnosis of SARS-CoV-2 by FDA under an Emergency Use Authorization (EUA). This EUA will remain  in effect (meaning this test can be used) for the duration of the COVID-19 declaration under Section 564(b)(1) of the Act, 21 U.S.C.section 360bbb-3(b)(1), unless the authorization is terminated  or revoked sooner.       Influenza A by PCR NEGATIVE NEGATIVE Final   Influenza B by PCR NEGATIVE NEGATIVE Final    Comment: (NOTE) The Xpert Xpress SARS-CoV-2/FLU/RSV plus assay is intended as an aid in the diagnosis of influenza from Nasopharyngeal swab specimens and should not be used as a sole basis for treatment. Nasal washings and aspirates are unacceptable for Xpert Xpress SARS-CoV-2/FLU/RSV testing.  Fact Sheet for Patients: BloggerCourse.com  Fact Sheet for Healthcare Providers: SeriousBroker.it  This test is not yet approved or cleared by the [place] FDA  and has been authorized for detection and/or diagnosis of SARS-CoV-2 by FDA under an Emergency Use Authorization (EUA). This EUA will remain in effect (meaning this test can be used) for the duration of the COVID-19 declaration under Section 564(b)(1) of the Act, 21 U.S.C. section 360bbb-3(b)(1), unless the authorization is terminated or revoked.  Performed at [facility], [address] [place], [place] [address]          Radiology Studies: No results found.      Scheduled Meds: . aspirin  325 mg Oral Daily  . enoxaparin (LOVENOX) injection  40 mg Subcutaneous Q24H  . feeding supplement  237 mL Oral TID with meals  . folic acid  1 mg Oral Daily  . multivitamin with minerals  1 tablet Oral Daily  . pantoprazole (PROTONIX) IV  40 mg Intravenous Q24H  . potassium chloride  40 mEq Oral Once  . thiamine  100 mg Oral Daily   Or  . thiamine  100 mg Intravenous Daily   Continuous Infusions: . sodium chloride 75 mL/hr (08/26/20 0555)  . sodium chloride Stopped (08/26/20 0439)  . lacosamide (VIMPAT) IV 200 mg (08/26/20 1136)  . levETIRAcetam 1,500 mg (08/26/20 1002)     LOS: 3 days       Kathlen Mody, MD [facility]   To contact the attending provider between 7A-7P or the covering provider during after hours 7P-7A, please log into the web site [email or web address] and access using universal [place] password for that web site. If you do not have the password, please call the hospital operator.  08/26/2020, 3:54 PM

## 2020-08-26 NOTE — Plan of Care (Signed)

## 2020-08-26 NOTE — Progress Notes (Signed)
Palliative Medicine Inpatient Follow Up Note  Reason for consult:  Goals of Care "goals of care, status epilepticus"  HPI:  Per intake H&P --> Cindy [name] a 60 y.o.femalewith medical history significant ofhypertension, hyperlipidemia, CVA, seizure with prior history of status epilepticus, and polysubstance abuse(alcohol and cocaine)who presents after being found to be acutely altered. The patient's husband called EMS after finding her acutely altered, unresponsive with urinary incontinence, and reports of shaking/jerking episodes lasting 5 to 10 minutes on the floor since last night around 9 PM. After each episode patient was reported to be better, but still have slurred speech and was not able to get up. Husband was apparently able to get her back in bed this morning, but when he came to check on her around lunch time she was in the bed with shaking and jerking any call EMS. Upon EMS arrival patient was noted to initially be able to open eyes and communicate, but acutely had episode of not responding or talking anymore with right-sided facial droop. Patient had been admitted last year to the hospital for status epilepticus found to have multiple infarcts in 07/2019 and subsequently in 01/2020 with seizures. During both admissions patient was found to be positive for cocaine has a prior history of alcohol abuse.  Palliative care was asked to get involved to further address goals of care.  Today's Discussion (08/26/2020):  *Please note that this is a verbal dictation therefore any spelling or grammatical errors are due to the "[place]" system interpretation.  Chart reviewed.  I met with patient's husband Cindy [name] at bedside.  We discussed Cindy [name]'s active medical conditions.  Discussed that she has recurrent seizures and the neurology team is working on thorough control of those.  Reviewed that her level of alertness has been fluctuant and that this may likely continue.  Introduced  a MOST form.  Care and I carefully reviewed each section and completed it as below:  Cardiopulmonary Resuscitation: Do Not Attempt Resuscitation (DNR/No CPR)  Medical Interventions: Limited Additional Interventions: Use medical treatment, IV fluids and cardiac monitoring as indicated, DO NOT USE intubation or mechanical ventilation. May consider use of less invasive airway support such as BiPAP or CPAP. Also provide comfort measures. Transfer to the hospital if indicated. Avoid intensive care.   Antibiotics: Antibiotics if indicated  IV Fluids: IV fluids if indicated  Feeding Tube: No feeding tube   We reviewed the hope for Cindy [name] to continue improving and become more alert and aware.  We discussed the best case and worst-case scenarios.  The best case is that Cindy [name] improves and gets to a point where she is functional as she was prior the will likely have a new decreased baseline.  The worst case scenario is that Cindy [name] continues to be altered impulsive and deteriorates further at which point in time there was conversation about considering hospice care.  Cindy [name] is remaining positive and hoping for the best possible outcome.  Questions and concerns addressed   Objective Assessment: Vital Signs Vitals:   08/26/20 0403 08/26/20 0801  BP: 129/83 122/81  Pulse: 88 84  Resp: 20 16  Temp: 98.5 F (36.9 C) 99 F (37.2 C)  SpO2: 100% 100%    Intake/Output Summary (Last 24 hours) at 08/26/2020 1325 Last data filed at 08/26/2020 0555 Gross per 24 hour  Intake 999.31 ml  Output 1150 ml  Net -150.69 ml   Last Weight  Most recent update: 08/23/2020  3:24 PM   Weight  56.6  kg (124 lb 12.5 oz)           Gen:  Caucasian F appeared older than stated age 89: Dry mucous membranes, electrodes in place CV: Irregular rate and rhythm  PULM: clear to auscultation bilaterally  ABD: soft/nontender  EXT: No edema  Neuro: Somnolent, able to arouse with deep stimulation  SUMMARY OF RECOMMENDATIONS    DNAR/DNI  MOST Completed, paper copy placed onto the chart electric copy can be found in [place]  DNR Form Completed, paper copy placed onto the chart electric copy can be found in [facility]  Patient husband would like to continue to see if Cindy [name] can make improvements in the oncoming days  Ongoing incremental PMT support  Discussed with Dr. Karleen Hampshire  Time Spent: 35 Greater than 50% of the time was spent in counseling and coordination of care ______________________________________________________________________________________ [place] Team Team Cell Phone: [phone] Please utilize secure chat with additional questions, if there is no response within 30 minutes please call the above phone number  Palliative Medicine Team providers are available by phone from 7am to 7pm daily and can be reached through the team cell phone.  Should this patient require assistance outside of these hours, please call the patient's attending physician.

## 2020-08-26 NOTE — Evaluation (Signed)
Occupational Therapy Evaluation Patient Details Name: Cindy [name] MRN: [id number] DOB: [birth year] Today's Date: 08/26/2020    History of Present Illness 60 yo female admitted with AMS .Pt with HTN and polysubstance abuse on arrival.  Current workup with L hemisphere seizure PMH HTN HLD CVA seizures Polysubstance   Clinical Impression   PT admitted with AMS current workup for L hemisphere seizure on continuous EEG. Pt currently with functional limitiations due to the deficits listed below (see OT problem list). Pt currently requires (A) for all at this time. Pt poor historian at this time to know PTA information. Pt requires change of position for arousal and repeating what therapist stay to her at times. Pt with no response and impulsive throughout session.  Pt will benefit from skilled OT to increase their independence and safety with adls and balance to allow discharge SNF.     Follow Up Recommendations  SNF    Equipment Recommendations  Other (comment) (TBA)    Recommendations for Other Services Speech consult     Precautions / Restrictions Precautions Precautions: Fall Precaution Comments: elbow block splint over IV to protect it in R UE Restrictions Weight Bearing Restrictions: No      Mobility Bed Mobility Overal bed mobility: Needs Assistance Bed Mobility: Rolling;Supine to Sit;Sit to Supine Rolling: Mod assist;+2 for physical assistance   Supine to sit: Mod assist;+2 for physical assistance Sit to supine: Mod assist;+2 for physical assistance   General bed mobility comments: Mod assist rolling and with trunk support to rise, LE and trunk support to return to bed with +2 assist upon sitting due to impaired postural control, impulsiveness leaning forward heavily at times. Assist managing multiple lines/leads. Cues for initating mobility with minimal response.    Transfers Overall transfer level: Needs assistance Equipment used: 2 person hand held  assist Transfers: Sit to/from Stand Sit to Stand: Mod assist;+2 physical assistance         General transfer comment: Mod assist +2 for boost and balance to stand x2 from bed. Initially with Rt knee block, showing some improved control on Lt, pt able to tolerate standing short period of time however issues raising her head and extending hips into full upright posture. Leaning towards left, sits abruptly with assist for control.    Balance Overall balance assessment: Needs assistance Sitting-balance support: Single extremity supported;Feet supported Sitting balance-Leahy Scale: Zero                                     ADL either performed or assessed with clinical judgement   ADL Overall ADL's : Needs assistance/impaired                                       General ADL Comments: requires (A) for all adls.     Vision Baseline Vision/History: Wears glasses (seen in photo but not in room)       Perception     Praxis      Pertinent Vitals/Pain Pain Assessment: Faces Faces Pain Scale: Hurts little more Pain Location: unable to verbalize Pain Descriptors / Indicators: Grimacing Pain Intervention(s): Limited activity within patient's tolerance;Monitored during session;Repositioned     Hand Dominance Right   Extremity/Trunk Assessment Upper Extremity Assessment Upper Extremity Assessment: RUE deficits/detail;LUE deficits/detail RUE Deficits / Details: active movement noted but unable to hold  against gravity. pt with decrease sensation and awareness of arm in space RUE Sensation: decreased proprioception;decreased light touch RUE Coordination: decreased gross motor;decreased fine motor LUE Deficits / Details: pushing with L UE, not following commands with hand, pt does reach for rail with hand over hand placement, pt able to reach against gravity LUE Coordination: decreased fine motor;decreased gross motor   Lower Extremity Assessment Lower  Extremity Assessment: Defer to PT evaluation;RLE deficits/detail RLE Deficits / Details: decreased sensation compared to painful stimuli withdrawal to LLE   Cervical / Trunk Assessment Cervical / Trunk Assessment: Kyphotic;Other exceptions (unable to hold head against gravity into extension)   Communication Communication Communication: Expressive difficulties;Other (comment) (limited assessment due to AMS)   Cognition Arousal/Alertness: Lethargic Behavior During Therapy: Flat affect;Impulsive (becomes a bit agitated and restless as her arousal level increases) Overall Cognitive Status: No family/caregiver present to determine baseline cognitive functioning                                 General Comments: Minimally verbal, occasionally repeating questions asked by therapists.   General Comments  VSS, BP obtained with minimal movement in R UE, Pt noted on initial arrival to have IV out of hand. RN addressed prior to session    Exercises     Shoulder Instructions      Home Living Family/patient expects to be discharged to:: Unsure                                 Additional Comments: Pt poor historian - family not immediately available for dispo planning      Prior Functioning/Environment Level of Independence: Independent with assistive device(s);Needs assistance (History obtained from prior admission)  Gait / Transfers Assistance Needed: household ambulator using RW (Hx obtained from prior admission) ADL's / Homemaking Assistance Needed: assisted by family (Hx obtained from prior admission)   Comments: Hx obtained from prior admission - pt unable to report at this time        OT Problem List: Decreased strength;Decreased activity tolerance;Impaired balance (sitting and/or standing);Impaired vision/perception;Decreased coordination;Decreased cognition;Decreased safety awareness;Decreased knowledge of use of DME or AE;Decreased knowledge of  precautions;Impaired sensation;Cardiopulmonary status limiting activity;Impaired UE functional use;Pain;Decreased range of motion      OT Treatment/Interventions: Self-care/ADL training;Therapeutic exercise;Neuromuscular education;Energy conservation;DME and/or AE instruction;Manual therapy;Modalities;Therapeutic activities;Cognitive remediation/compensation;Visual/perceptual remediation/compensation;Patient/family education;Balance training    OT Goals(Current goals can be found in the care plan section) Acute Rehab OT Goals Patient Stated Goal: none stated OT Goal Formulation: Patient unable to participate in goal setting Time For Goal Achievement: 09/09/20 Potential to Achieve Goals: Good  OT Frequency: Min 2X/week   Barriers to D/C: Decreased caregiver support  unknown (A)       Co-evaluation PT/OT/SLP Co-Evaluation/Treatment: Yes Reason for Co-Treatment: Complexity of the patient's impairments (multi-system involvement);Necessary to address cognition/behavior during functional activity;For patient/therapist safety;To address functional/ADL transfers PT goals addressed during session: Mobility/safety with mobility;Balance;Proper use of DME;Strengthening/ROM OT goals addressed during session: ADL's and self-care;Proper use of Adaptive equipment and DME;Strengthening/ROM      AM-PAC OT "6 Clicks" Daily Activity     Outcome Measure Help from another person eating meals?: Total Help from another person taking care of personal grooming?: Total Help from another person toileting, which includes using toliet, bedpan, or urinal?: Total Help from another person bathing (including washing, rinsing, drying)?: Total Help from another person to  put on and taking off regular upper body clothing?: Total Help from another person to put on and taking off regular lower body clothing?: Total 6 Click Score: 6   End of Session Equipment Utilized During Treatment: Gait belt Nurse Communication:  Mobility status;Precautions  Activity Tolerance: Patient tolerated treatment well Patient left: in bed;with call bell/phone within reach;with bed alarm set  OT Visit Diagnosis: Unsteadiness on feet (R26.81);Muscle weakness (generalized) (M62.81)                Time: 7353-2992 ([id number]) OT Time Calculation (min): 17 min Charges:  OT General Charges $OT Visit: 1 Visit OT Evaluation $OT Eval Moderate Complexity: 1 Mod   Brynn, OTR/L  Acute Rehabilitation Services Pager: [phone] Office: [phone] .   Mateo Flow 08/26/2020, 11:06 AM

## 2020-08-26 NOTE — Progress Notes (Signed)
Inpatient Rehab Admissions Coordinator Note:   Per therapy recommendations, pt was screened for CIR candidacy by Megan Salon, MS CCC-SLP. At this time, Pt. Appears to have functional decline and is a potential candidate for CIR. Will place order for rehab consult per protocol. I will review case with rehab MD tomorrow morning to confirm that  Pt. Is appropriate for CIR.    Please contact me with questions.   Megan Salon, MS, CCC-SLP Rehab Admissions Coordinator  [phone] (celll) [phone] (office)

## 2020-08-26 NOTE — Progress Notes (Signed)
Neurology Progress Note Subjective: No acute overnight events reported Overnight EEG with improvement from yesterday- without seizures or epileptiform discharges seen; continues to show nonspecific cortical dysfunction arising from the left hemisphere  Exam: Vitals:   08/26/20 0403 08/26/20 0801  BP: 129/83 122/81  Pulse: 88 84  Resp: 20 16  Temp: 98.5 F (36.9 C) 99 F (37.2 C)  SpO2: 100% 100%   Gen: In bed, in no acute distress Resp: non-labored breathing, no respiratory distress Abd: soft, non-distended  Neurological examination: Mental Status:  Comfortably sleeping in bed, somnolent, does not open eyes to voice. Opens eyes very briefly to noxious stimulation but quickly closes them. Does not follow commands. Does not vocalize or attempt to answer orientation questions.  Cranial Nerves: Pupils 2 mm equal round reactive light bilaterally, no gaze preference or deviation, does not blink to threat from either side, face appears grossly symmetric. Motor: No spontaneous movement noted.  To noxious stimulation, forceful localization and withdrawal of the left upper extremity.  Weaker on the right upper extremity.  Similarly on the lower extremity, very brisk withdrawal in the left lower extremity but slower withdrawal in the right lower extremity. Tone is increased in the right upper and lower extremity, tone is normal in the left upper and lower extremities. Sensory: As above L > R localization / withdrawal to noxious stimuli. Grimaces to noxious stimuli application throughout.  DTR: 3+ and symmetric patellae and biceps.  Plantars downgoing bilaterally. Coordination/gait: Cannot be assessed due to her mentation  Pertinent Labs: CBC    Component Value Date/Time   WBC 9.2 08/25/2020 0931   RBC 4.15 08/25/2020 0931   HGB 13.2 08/25/2020 0931   HGB 14.8 03/26/2015 0921   HCT 38.9 08/25/2020 0931   HCT 42.3 03/26/2015 0921   PLT 304 08/25/2020 0931   PLT 363 03/26/2015 0921   MCV  93.7 08/25/2020 0931   MCV 96 03/26/2015 0921   MCH 31.8 08/25/2020 0931   MCHC 33.9 08/25/2020 0931   RDW 11.7 08/25/2020 0931   RDW 12.5 03/26/2015 0921   LYMPHSABS 0.8 08/25/2020 0931   MONOABS 1.0 08/25/2020 0931   EOSABS 0.2 08/25/2020 0931   BASOSABS 0.0 08/25/2020 0931   CMP     Component Value Date/Time   NA 135 08/26/2020 0247   NA 135 (L) 03/26/2015 0921   K 3.1 (L) 08/26/2020 0247   CL 104 08/26/2020 0247   CO2 22 08/26/2020 0247   GLUCOSE 106 (H) 08/26/2020 0247   BUN 6 (L) 08/26/2020 0247   BUN 7 03/26/2015 0921   CREATININE 0.92 08/26/2020 0247   CALCIUM 8.7 (L) 08/26/2020 0247   PROT 7.5 08/23/2020 1330   PROT 7.7 03/26/2015 0921   ALBUMIN 3.7 08/23/2020 1330   ALBUMIN 4.4 03/26/2015 0921   AST 56 (H) 08/23/2020 1330   ALT 18 08/23/2020 1330   ALKPHOS 73 08/23/2020 1330   BILITOT 1.0 08/23/2020 1330   BILITOT 0.6 03/26/2015 0921   GFRNONAA >60 08/26/2020 0247   GFRAA >60 02/16/2020 0319   Lab Results  Component Value Date   TSH 1.955 08/23/2020   Drugs of Abuse     Component Value Date/Time   LABOPIA NONE DETECTED 08/23/2020 1729   COCAINSCRNUR POSITIVE (A) 08/23/2020 1729   LABBENZ NONE DETECTED 08/23/2020 1729   AMPHETMU NONE DETECTED 08/23/2020 1729   THCU NONE DETECTED 08/23/2020 1729   LABBARB NONE DETECTED 08/23/2020 1729    Urinalysis    Component Value Date/Time   COLORURINE  YELLOW 08/23/2020 1729   APPEARANCEUR CLEAR 08/23/2020 1729   LABSPEC 1.042 (H) 08/23/2020 1729   PHURINE 7.0 08/23/2020 1729   GLUCOSEU NEGATIVE 08/23/2020 1729   HGBUR NEGATIVE 08/23/2020 1729   BILIRUBINUR NEGATIVE 08/23/2020 1729   KETONESUR 5 (A) 08/23/2020 1729   PROTEINUR NEGATIVE 08/23/2020 1729   NITRITE NEGATIVE 08/23/2020 1729   LEUKOCYTESUR NEGATIVE 08/23/2020 1729   Imaging:  CT C-Spine: IMPRESSION: 1. Marked severity multilevel degenerative changes, most prominent at the levels of C4-C5, C5-C6 and C6-C7. 2. No acute cervical spine  fracture.  MRI brain:  1. No acute intracranial abnormality. 2. Remote lacunar infarct in the bilateral centrum semiovale, corona radiata and right thalamus. 3. Advanced chronic microvascular ischemic changes. Mild parenchymal volume loss. 4. Small right mastoid effusion.  CT/CT angio head and neck/CT cerebral perfusion: No acute intracranial hemorrhage or evidence of acute infarction. ASPECT score is 10. No large vessel occlusion or hemodynamically significant stenosis. Mild left mid M1 MCA stenosis. Perfusion imaging demonstrates no evidence of core infarction or penumbra. 8 mL of calculated penumbra in the bilateral anterior frontal lobes is likely artifactual.  EEG overnight:  "This study is suggestive of non specific cortical dysfunction arising from lefthemisphere.No seizures ordefiniteepileptiform discharges were seen throughout the recording. EEG continues to be improving compared to previous day."  Impression: 60 year old female with a history of status epilepticus, multiple remote infarcts, cocaine abuse, and alcohol abuse found unconscious by husband with shaking, jerking, and urinary incontinence multiple times from 4/6 - 4/7.  - Initial concern for stroke due to possible right facial droop, left gaze preference, and right-sided weakness. MRI and CT without evidence of acute infarct. Etiology most likely seizure versus status epilepticus.  - September 2021 EEG showedepileptogenicityand cortical dysfunctionarising from left hemisphere, maximal left posterior quadrant suggestive of underlying structural abnormality. This pattern is on the ictal-interictal continuum with higher potential for seizure recurrence. She was diagnosed with status epilepticus, treated with keppra and vimpat and continued on discharge. Current EEGs with continuing evidence of non specific cortical dysfunction from the left hemisphere but without seizures or epileptiform discharges.  - She was given 2  mg Ativan, 2 g Keppra load, and 200 mg Vimpat in the ED and resumed on her home dosing of AEDs.  - Seizures likely provoked due to substance abuse versus medication noncompliance versus possible alcohol withdrawal seizure (serum alcohol <10 on admission) or multifactorial. UDS positive on admission for cocaine. - She continues to be drowsy and lethargic right side compared to the left-this has stayed stable-unclear of the baseline weakness if any (attempted to contact family regarding baseline without success).  EEG still shows left-sided cortical dysfunction without frank seizure activity and is improving each day of monitoring. Plan to discontinue LTM EEG.   Recommendations:  - Continue home keppra 1,500 mg BID and Vimpat 200 mg BID - Discontinue LTM EEG  - Consider repeating MRI brain with persistent depressed mental state and no active seizures / epileptiform discharges for > 48 hours - Inpatient seizure precautions - Lifestyle modifications; counsel on cocaine and alcohol abstinence when receptive - PRN Ativan 2 mg IV for any seizure lasting > 5 minutes  Lanae Boast, AGACNP-BC [facility] [phone]   Attending addendum Agree with history and physical above On my examination, the patient was able to open eyes to loud voice and noxious simulation She has increased tone on the right upper and lower extremity and has weaker withdrawal on dose due to noxious simulation. Left side she withdraws  swiftly. She has no gaze preference or deviation. Last neurology note from 2021 does not mention any focal weakness although EEGs have always shown left-sided cortical abnormality Right-sided weakness could be Todd's paralysis-but the duration has been too long.  Would like to get more history from the family to know if she has had right-sided weakness that has been persistent given the increased tone.  Attempted to reach family on multiple occasions over the past couple of days  without success. Agree with above that MRI brain should be repeated if continues to be encephalopathic and right-sided weak. See assessment and recommendations above which I helped formulate. Neurology will follow. -- Milon Dikes, MD Neurologist [facility] Pager: [phone]

## 2020-08-26 NOTE — Procedures (Addendum)
Patient Name:Cindy [name] EYC:[id number] Epilepsy Attending:Raymie Giammarco [name] Referring Physician/Provider:Dr Marvel Plan Duration:08/25/2020 0730 to 4/10/20220730  Patient history:59 yo F w possible seizure activity with left gaze deviation and shaking.EEG to evaluate for seizure.  Level of alertness:Awake, asleep  AEDs during EEG study:LEV, LCM  Technical aspects: This EEG study was done with scalp electrodes positioned according to the 10-20 International system of electrode placement. Electrical activity was acquired at a sampling rate of 500Hz  and reviewed with a high frequency filter of 70Hz  and a low frequency filter of 1Hz . EEG data were recorded continuously and digitally stored.   Description: The posterior dominant rhythm consists of8Hz  activity of moderate voltage (25-35 uV) seen predominantly in posterior head regions, symmetricand reactive to eye opening and eye closing.  Sleep was characterized by vertex waves, sleep spindles (12 to 14 Hz), maximal frontocentral region. EEG showed intermittent 3 to 6 Hz theta-delta slowingin left hemisphere.Hyperventilation and photic stimulation were not performed.   Of note, parts of study were difficult to interpret due to F7 electrode artifact, movement artifact.  ABNORMALITY - Intermittent slow,Left hemisphere  IMPRESSION: This study is suggestive of nin specific cortical dysfunction arising from lefthemisphere.No seizures ordefiniteepileptiform discharges were seen throughout the recording.  EEG continues to be improving compared to previous day.   Aspen Deterding 

## 2020-08-26 NOTE — Consult Note (Signed)
Palliative Medicine Inpatient Consult Note  Reason for consult:  Goals of Care "goals of care, status epilepticus"  HPI:  Per intake H&P --> Cindy [name] is a 60 y.o. female with medical history significant of hypertension, hyperlipidemia, CVA, seizure with prior history of status epilepticus, and polysubstance abuse(alcohol and cocaine) who presents after being found to be acutely altered. The patient's husband called EMS after finding her acutely altered, unresponsive with urinary incontinence, and reports of shaking/jerking episodes lasting 5 to 10 minutes on the floor since last night around 9 PM.  After each episode patient was reported to be better, but still have slurred speech and was not able to get up.  Husband was apparently able to get her back in bed this morning, but when he came to check on her around lunch time she was in the bed with shaking and jerking any call EMS.  Upon EMS arrival patient was noted to initially be able to open eyes and communicate, but acutely had episode of not responding or talking anymore with right-sided facial droop.  Patient had been admitted last year to the hospital for status epilepticus found to have multiple infarcts in 07/2019 and subsequently in 01/2020 with seizures.  During both admissions patient was found to be positive for cocaine has a prior history of alcohol abuse.  Palliative care was asked to get involved to further address goals of care.  Clinical Assessment/Goals of Care:  *Please note that this is a verbal dictation therefore any spelling or grammatical errors are due to the "Dragon Medical One" system interpretation.  I have reviewed medical records including EPIC notes, labs and imaging, received report from bedside RN, assessed the patient who is somnolent, has EEG monitoring in place.    I called patients husband, Cindy [name] three times with not answer therefore I called her brother, Cindy [name] to further discuss diagnosis prognosis, GOC,  EOL wishes, disposition and options.   I introduced Palliative Medicine as specialized medical care for people living with serious illness. It focuses on providing relief from the symptoms and stress of a serious illness. The goal is to improve quality of life for both the patient and the family.  Cindy [name] shares with me that Cindy [name] is from [place], [place]. She has been married for fifteen years. She has not held a job in quite sometime though when she was working she had a job in the [employer]. 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 She is not overly religious.  She lives in a home with her husband, Cindy [name]. From what Cindy [name] understand prior to hospitalization she was able to perform bADL's and iADL's. We reviewed her history of drug and alcohol abuse. Cindy [name] shares that he had been under the assumption that Cindy [name] had "stopped using crack" and attributes her continued use to the "the people she is around and her husband". He feels that Cindy [name]'s use influences Cindy [name] greatly.   A detailed discussion was had today regarding advanced directives - there are non on file in Cindy [name].    Concepts specific to code status, artifical feeding and hydration, continued IV antibiotics and rehospitalization was had. Cindy [name] was unable to discuss this as he never had such conversations with his sister.     The difference between a aggressive medical intervention path  and a palliative comfort care path for this patient at this time was had. Reviewed the serious nature of cocaine and alcohol abuse in terms of the bigger picture  and how these habits can affect patients lives. Reviewed status epilepticus complications and the reality that if unable to control this condition, it can be fatal.   Discussed the importance of continued conversation with family and their  medical providers regarding overall plan of care and treatment options, ensuring decisions are within the context of the patients values and  GOCs.  Provided "Hard Choices for Cindy [name]" booklet.   Decision Maker: Cindy [name] (Spouse) [phone]  SUMMARY OF RECOMMENDATIONS   Full Code / Full Scope of care for time being  Continue to try to get in touch with patients husband  Ongoing conversations in the oncoming days  Code Status/Advance Care Planning: FULL CODE   Palliative Prophylaxis:   Oral Care, Mobility   Additional Recommendations (Limitations, Scope, Preferences):  Continue current scope of care   Psycho-social/Spiritual:   Desire for further Chaplaincy support: No  Additional Recommendations: Education on recurrent siezures   Prognosis: Poor in the setting of recurrent drug abuse and poor medical compliance  Discharge Planning: Unclear  Vitals:   08/25/20 2346 08/26/20 0403  BP: 138/89 129/83  Pulse: (!) 115 88  Resp: 20 20  Temp: 98.9 F (37.2 C) 98.5 F (36.9 C)  SpO2: 100% 100%    Intake/Output Summary (Last 24 hours) at 08/26/2020 4383 Last data filed at 08/26/2020 0555 Gross per 24 hour  Intake 999.31 ml  Output 1150 ml  Net -150.69 ml   Last Weight  Most recent update: 08/23/2020  3:24 PM   Weight  56.6 kg (124 lb 12.5 oz)           Gen:  Caucasian F appeared older than stated age HEENT: Dry mucous membranes, electrodes in place CV: Irregular rate and rhythm  PULM: clear to auscultation bilaterally  ABD: soft/nontender  EXT: No edema  Neuro: Somnolent  PPS: 20%   This conversation/these recommendations were discussed with patient primary care team, Dr. Blake Divine  Time In: 1700 Time Out: 1810 Total Time: 70 Greater than 50%  of this time was spent counseling and coordinating care related to the above assessment and plan.  Lamarr Lulas [place] Palliative [facility] Team Team Cell Phone: [phone] Please utilize secure chat with additional questions, if there is no response within 30 minutes please call the above phone number  Palliative Medicine Team  providers are available by phone from 7am to 7pm daily and can be reached through the team cell phone.  Should this patient require assistance outside of these hours, please call the patient's attending physician.

## 2020-08-27 ENCOUNTER — Encounter (HOSPITAL_COMMUNITY): Payer: Self-pay | Admitting: Radiology

## 2020-08-27 ENCOUNTER — Inpatient Hospital Stay (HOSPITAL_COMMUNITY): Payer: Medicaid Other

## 2020-08-27 LAB — URINALYSIS, ROUTINE W REFLEX MICROSCOPIC
Bilirubin Urine: NEGATIVE
Glucose, UA: NEGATIVE mg/dL
Ketones, ur: NEGATIVE mg/dL
Leukocytes,Ua: NEGATIVE
Nitrite: POSITIVE — AB
Protein, ur: NEGATIVE mg/dL
Specific Gravity, Urine: 1.012 (ref 1.005–1.030)
pH: 7 (ref 5.0–8.0)

## 2020-08-27 LAB — CBC WITH DIFFERENTIAL/PLATELET
Abs Immature Granulocytes: 0.05 10*3/uL (ref 0.00–0.07)
Basophils Absolute: 0 10*3/uL (ref 0.0–0.1)
Basophils Relative: 0 %
Eosinophils Absolute: 0.1 10*3/uL (ref 0.0–0.5)
Eosinophils Relative: 1 %
HCT: 36.5 % (ref 36.0–46.0)
Hemoglobin: 12.6 g/dL (ref 12.0–15.0)
Immature Granulocytes: 0 %
Lymphocytes Relative: 9 %
Lymphs Abs: 1 10*3/uL (ref 0.7–4.0)
MCH: 31 pg (ref 26.0–34.0)
MCHC: 34.5 g/dL (ref 30.0–36.0)
MCV: 89.9 fL (ref 80.0–100.0)
Monocytes Absolute: 1.2 10*3/uL — ABNORMAL HIGH (ref 0.1–1.0)
Monocytes Relative: 11 %
Neutro Abs: 8.9 10*3/uL — ABNORMAL HIGH (ref 1.7–7.7)
Neutrophils Relative %: 79 %
Platelets: 312 10*3/uL (ref 150–400)
RBC: 4.06 MIL/uL (ref 3.87–5.11)
RDW: 11.5 % (ref 11.5–15.5)
WBC: 11.3 10*3/uL — ABNORMAL HIGH (ref 4.0–10.5)
nRBC: 0 % (ref 0.0–0.2)

## 2020-08-27 LAB — BASIC METABOLIC PANEL
Anion gap: 9 (ref 5–15)
BUN: 8 mg/dL (ref 8–23)
CO2: 20 mmol/L — ABNORMAL LOW (ref 22–32)
Calcium: 8.6 mg/dL — ABNORMAL LOW (ref 8.9–10.3)
Chloride: 103 mmol/L (ref 98–111)
Creatinine, Ser: 0.84 mg/dL (ref 0.44–1.00)
GFR, Estimated: >60 mL/min (ref >60)
Glucose, Bld: 131 mg/dL — ABNORMAL HIGH (ref 70–99)
Potassium: 2.7 mmol/L — CL (ref 3.5–5.1)
Sodium: 132 mmol/L — ABNORMAL LOW (ref 135–145)

## 2020-08-27 LAB — PHOSPHORUS: Phosphorus: 2.9 mg/dL (ref 2.5–4.6)

## 2020-08-27 LAB — MAGNESIUM: Magnesium: 1.6 mg/dL — ABNORMAL LOW (ref 1.7–2.4)

## 2020-08-27 LAB — CK: Total CK: 534 U/L — ABNORMAL HIGH (ref 38–234)

## 2020-08-27 IMAGING — CR DG CHEST 2V
2 series · 2 of 2 positions shown · non-contrast
Comparison: [DATE]

CLINICAL DATA: Fever.

EXAM:
CHEST - 2 VIEW

[chest lat]
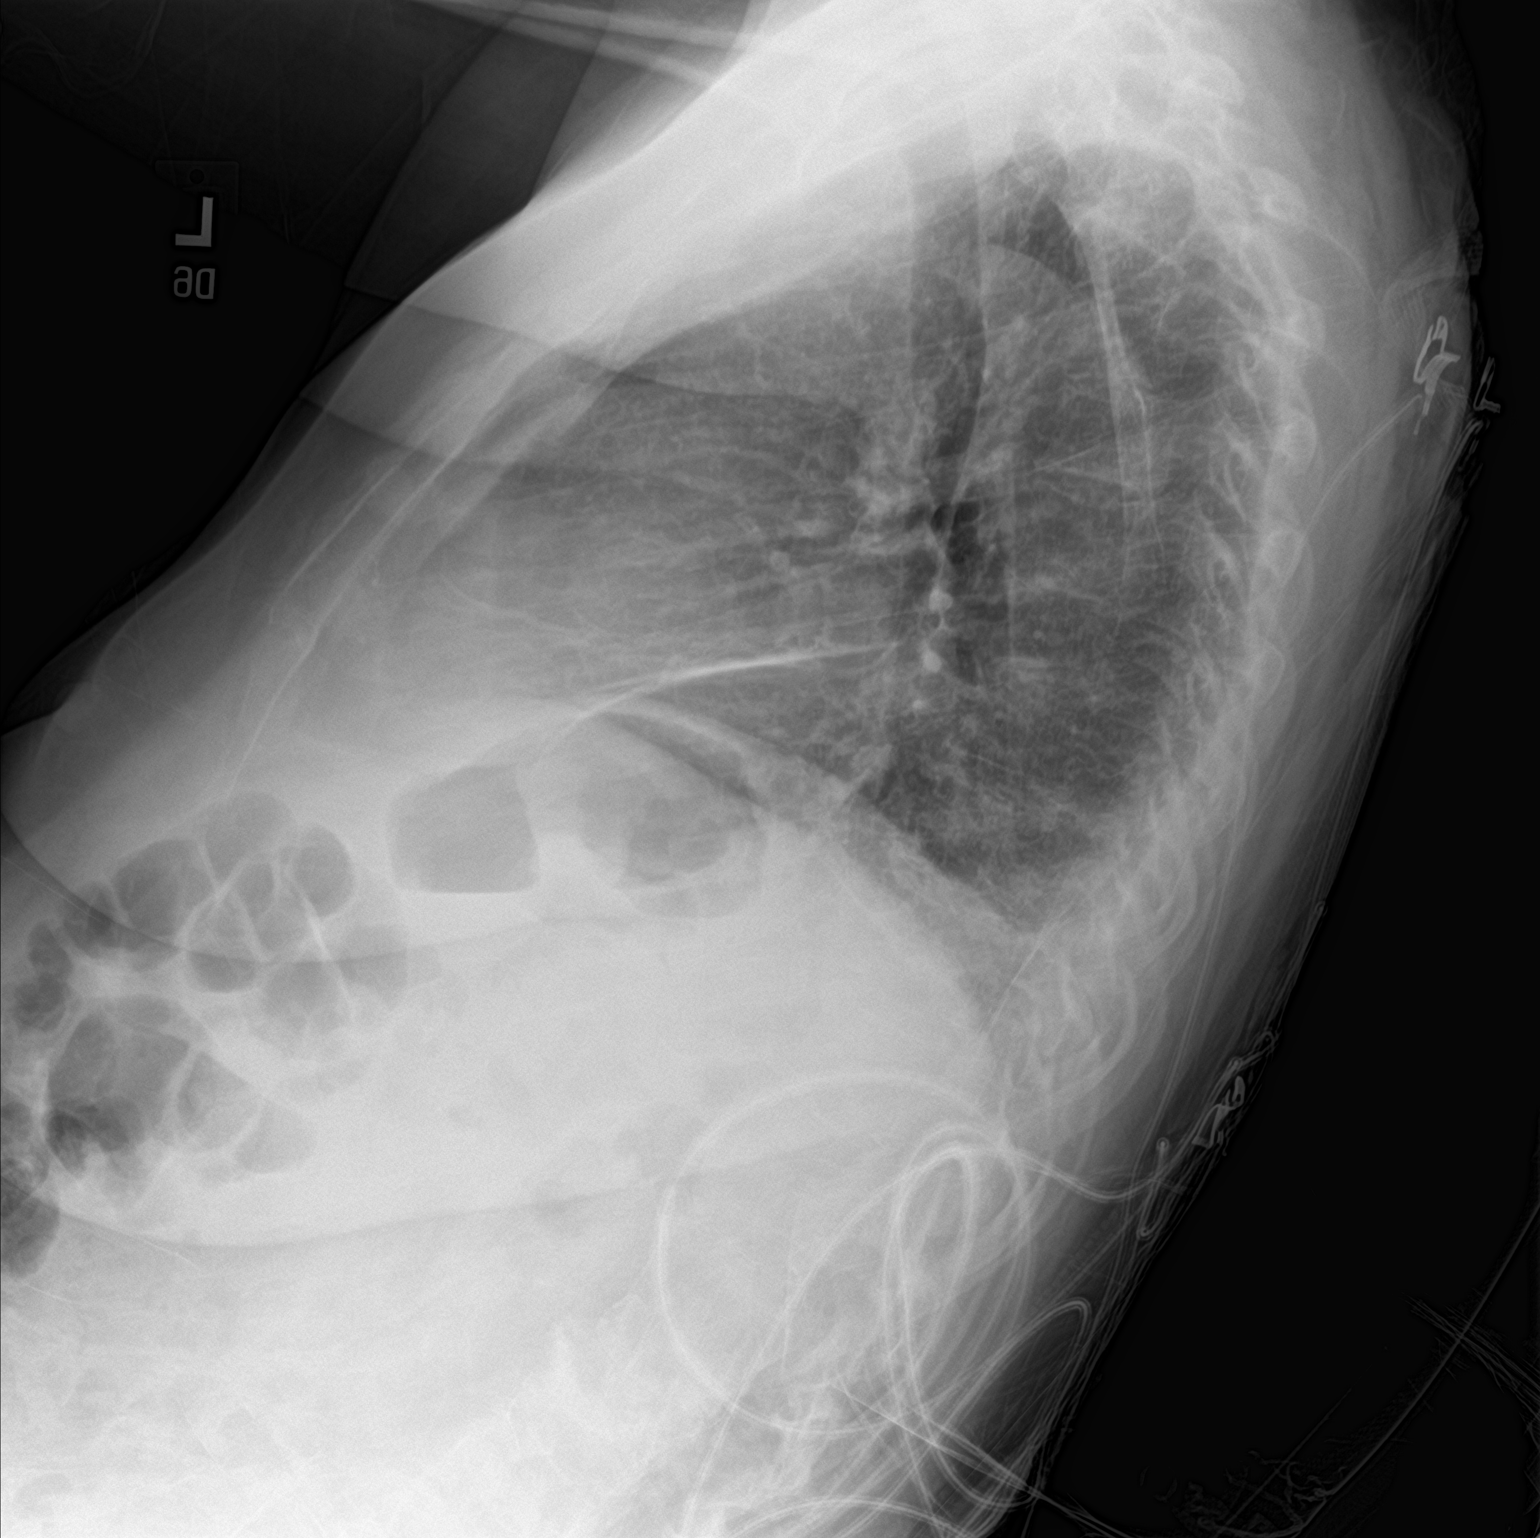

[chest ap]
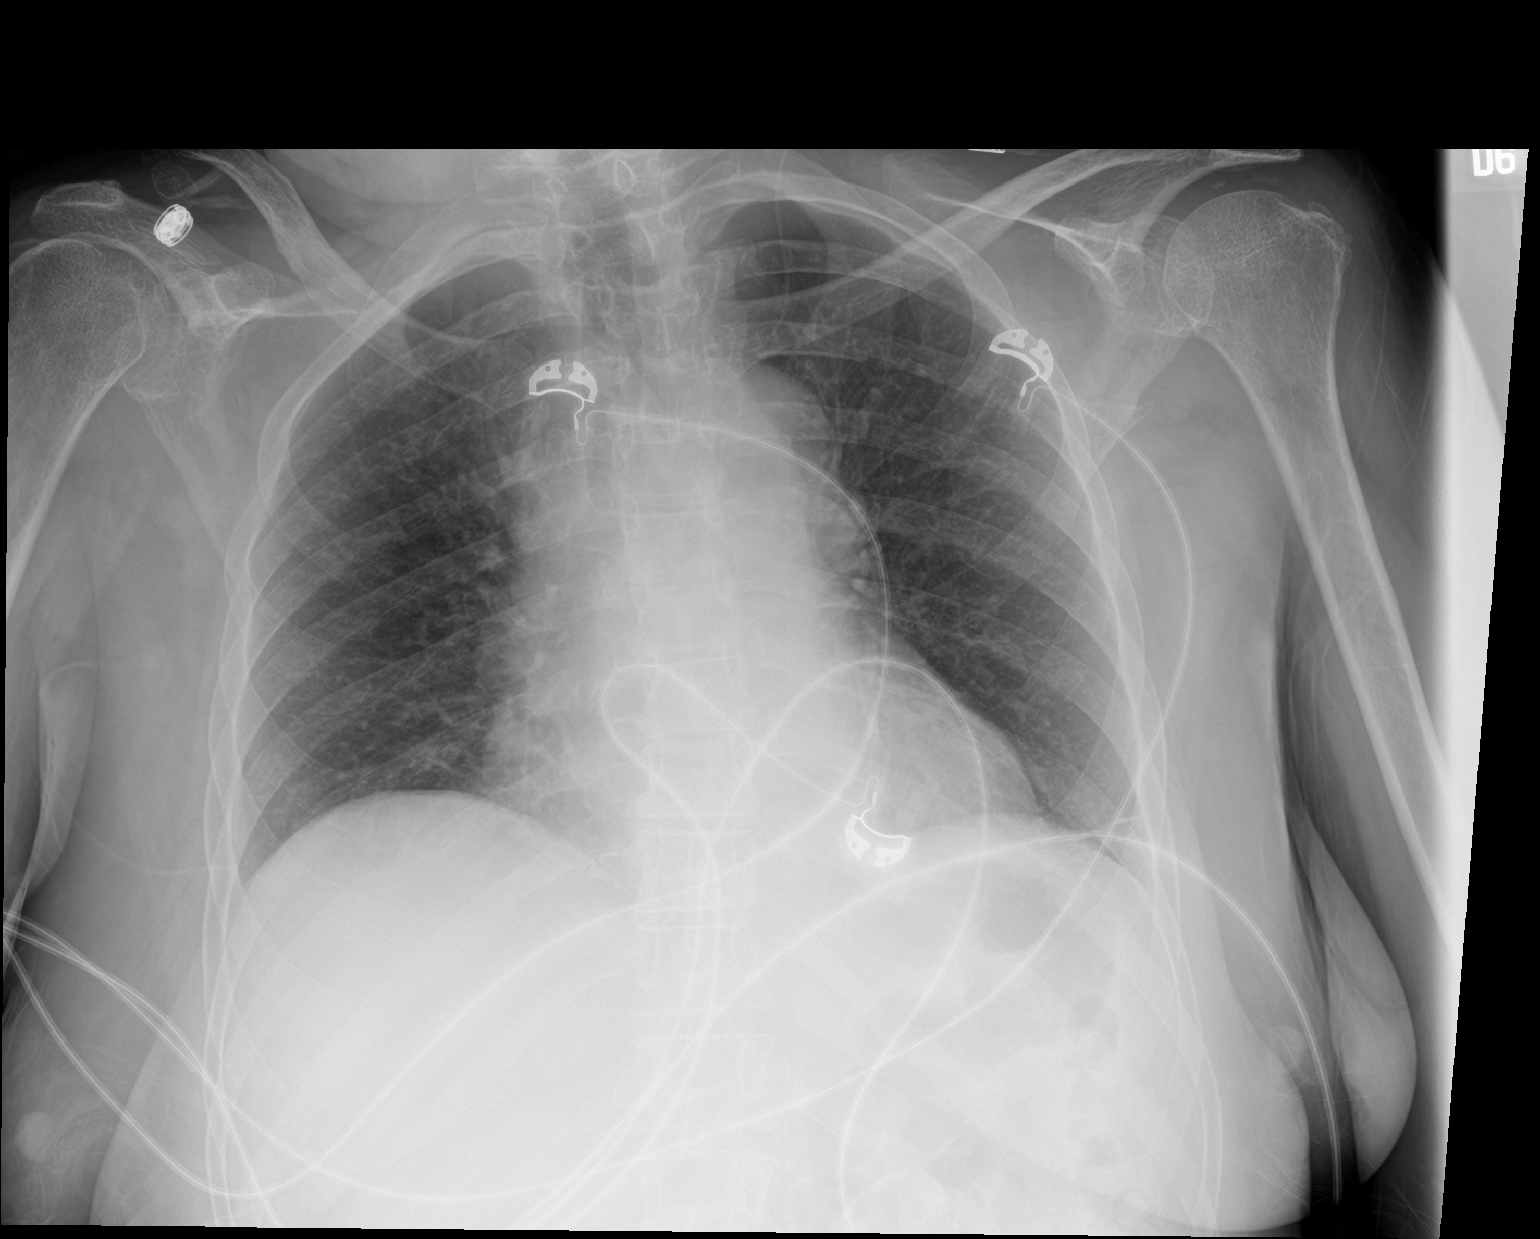

[2 of 2 positions shown; findings below may reference images not displayed]

FINDINGS: Persistent low lung volumes. Stable heart size and mediastinal
contours. Streaky left lung base atelectasis. There is a small left
pleural effusion. No pneumothorax. Small bore catheter projects in
the left supraclavicular region. The bones are under mineralized.
Chronic right AC joint separation.
IMPRESSION: Low lung volumes with streaky left lung base atelectasis and small
left pleural effusion.

## 2020-08-27 MED ORDER — POTASSIUM CHLORIDE 10 MEQ/100ML IV SOLN
10.0000 meq | INTRAVENOUS | Status: AC
  Administered 2020-08-27 (×4): 10 meq via INTRAVENOUS
  Filled 2020-08-27 (×4): qty 100

## 2020-08-27 MED ORDER — MAGNESIUM SULFATE 2 GM/50ML IV SOLN
2.0000 g | Freq: Once | INTRAVENOUS | Status: AC
  Administered 2020-08-27: 2 g via INTRAVENOUS
  Filled 2020-08-27: qty 50

## 2020-08-27 MED ORDER — POTASSIUM CHLORIDE CRYS ER 20 MEQ PO TBCR
40.0000 meq | EXTENDED_RELEASE_TABLET | Freq: Two times a day (BID) | ORAL | Status: DC
  Administered 2020-08-27 – 2020-08-31 (×5): 40 meq via ORAL
  Filled 2020-08-27 (×5): qty 2

## 2020-08-27 NOTE — Progress Notes (Signed)
Nutrition Follow-up  DOCUMENTATION CODES:  Non-severe (moderate) malnutrition in context of chronic illness  INTERVENTION:  Continue 1:1 feeding assistance with meals and supplements.  Continue finger foods diet - RD to order meals.  Continue Ensure Enlive po TID, each supplement provides 350 kcal and 20 grams of protein.  Continue MVI with minerals, folic acid, and thiamine daily.  NUTRITION DIAGNOSIS:  Moderate Malnutrition related to chronic illness (CVA, seizures, HTN) as evidenced by mild fat depletion,moderate fat depletion,mild muscle depletion,moderate muscle depletion. - ongoing  GOAL:  Patient will meet greater than or equal to 90% of their needs - not meeting  MONITOR:  PO intake,Supplement acceptance,Weight trends,Skin  REASON FOR ASSESSMENT:  Consult Assessment of nutrition requirement/status  ASSESSMENT:  60 yo female with a PMH of HTN, HLD, CVA, seizures w/ prior hx of status epilepticus, and polysubstance abuse (EtOH and cocaine) who presents with acute metabolic encephalopathy. UDS positive for cocaine on admission. 4/7 - EEG without seizures 4/8 - brain MRI clear, head CT/angio clear 4/10 - met with palliative care, no feeding tube  Per MD notes, pt continues to be lethargic and confused.   Spoke with RN. RN reports that weekend RN reported that pt had no appetite. She would sometimes just pick up the food with her fingers and hold it, not eat it. Other times, she would take bites. RN to try to do 1:1 feeding assistance with pt today at lunch. RN unsure if pt is receiving Ensures, RN to try to give pt one today with lunch and see how it goes.  Recommend continuing finger foods diet and Ensure TID. RD to order meals through [employer] through Friday AM.  Medications: folic acid 1 mg, MVI with minerals, Protonix, thiamine 100 mg injection Labs: reviewed; K 3.1, CBG 97-126  Diet Order:   Diet Order            DIET FINGER FOODS Room service appropriate? No;  Fluid consistency: Thin  Diet effective now                EDUCATION NEEDS:  Not appropriate for education at this time  Skin:  Skin Assessment: Reviewed RN Assessment (Abrasions/Rash on R leg and abdomen)  Last BM:  08/27/20 - Type 5, medium amount  Height:  Ht Readings from Last 1 Encounters:  02/18/20 5' 7" (1.702 m)   Weight:  Wt Readings from Last 1 Encounters:  08/23/20 56.6 kg   Ideal Body Weight:  61.4 kg  BMI:  Body mass index is 19.54 kg/m.  Estimated Nutritional Needs:  Kcal:  1850-2050 Protein:  77-90 grams Fluid:  >1.85 L   , RD, LDN Registered Dietitian After Hours/Weekend Pager # in Amion 

## 2020-08-27 NOTE — Progress Notes (Signed)
Had been calm for most of the day with CIWA only as high as 3-4.  Now is attempting to climb out of the bed and yelling and agitated.  HR now in the upper 140s.  She had been incont of urine and was cleaned up and changed.  Denies pain but is difficult to tell, as she is so agitated.  Became very upset because mittens had to be placed on her hands, as she attempted to pull out IV access and to remove tele.  Paged Dr Blake Divine and waiting for return call with possible orders.

## 2020-08-27 NOTE — Plan of Care (Signed)
  Problem: Clinical Measurements: Goal: Will remain free from infection Outcome: Progressing Goal: Diagnostic test results will improve Outcome: Progressing Goal: Respiratory complications will improve Outcome: Progressing Goal: Cardiovascular complication will be avoided Outcome: Progressing   Problem: Activity: Goal: Risk for activity intolerance will decrease Outcome: Progressing   

## 2020-08-27 NOTE — Procedures (Signed)
Patient Name:Cindy [name] AYT:[id number] Epilepsy Attending:Magaret Justo [name] Referring Physician/Provider:Dr Marvel Plan Duration:4/10/20220730 to 4/10/20221521  Patient history:59 yo F w possible seizure activity with left gaze deviation and shaking.EEG to evaluate for seizure.  Level of alertness:Awake, asleep  AEDs during EEG study:LEV, LCM  Technical aspects: This EEG study was done with scalp electrodes positioned according to the 10-20 International system of electrode placement. Electrical activity was acquired at a sampling rate of 500Hz  and reviewed with a high frequency filter of 70Hz  and a low frequency filter of 1Hz . EEG data were recorded continuously and digitally stored.   Description: The posterior dominant rhythm consists of8Hz  activity of moderate voltage (25-35 uV) seen predominantly in posterior head regions, symmetricand reactive to eye opening and eye closing.  Sleep was characterized by vertex waves, sleep spindles (12 to 14 Hz), maximal frontocentral region. EEG showed intermittent 3 to 6 Hz theta-delta slowingin left hemisphere.Hyperventilation and photic stimulation were not performed.   Of note, parts of study were difficult to interpret due to electrode artifact, movement artifact.  ABNORMALITY - Intermittent slow,Left hemisphere  IMPRESSION: This study is suggestive of non specific cortical dysfunction arising from lefthemisphere.No seizures ordefiniteepileptiform discharges were seen throughout the recording.   [name] 

## 2020-08-27 NOTE — Progress Notes (Signed)
Inpatient Rehab Admissions Coordinator:    I visited with Pt. And spouse in her room. Pt. Currently somnolent, not responding to voice or touch. She is not currently appropriate for CIR admission, but I will follow and monitor for progress in Pt.'s mentation and functional ability. Pt. Was in CIR last year following CVA and was discharged at min guard-min A level. He husband states that he would like her to return to CIR and then go home and affirms he can care for her at home.   Megan Salon, MS, CCC-SLP Rehab Admissions Coordinator  [phone] (celll) [phone] (office)

## 2020-08-27 NOTE — Progress Notes (Signed)
Sent Dr Blake Divine a message that K+ is 2.7, according to the lab, who called me with a critical value.  Dr did say she will put in order to replace.

## 2020-08-27 NOTE — Progress Notes (Signed)
PROGRESS NOTE    Cindy [name]  QPR:[id number] DOB: Dec 15, 1960 DOA: 08/23/2020 PCP: Avon Gully, MD    Chief Complaint  Patient presents with  . Code Stroke    Brief Narrative:  60 year old lady with prior h/o hypertension, hyperlipidemia, CVA, seizure, with prior h/o status epilepticus, polysubstance abuse ( use of cocaine and alcohol) admitted for altered mental status.   The patient's husband called EMS after finding her acutely altered, unresponsive with urinary incontinence, and reports of shaking/jerking episodes lasting 5 to 10 minutes on the floor since last night around 9 PM.  After each episode patient was reported to be better, but still have slurred speech and was not able to get up. Upon admission to the emergency department patient was seen as a code stroke and evaluated by neurology.  CT scan of the head was negative for any acute abnormalities.  CTA of the head neck showed no large vessel occlusion. She completed 48 hours of continuous EEG. This study is suggestive ofnon specificcortical dysfunction arising from lefthemisphere.No seizures ordefiniteepileptiform discharges were seen throughout the recording. Pt remains somnolent, opening eyes briefly with sternal rub but goes to sleep.     Assessment & Plan:   Active Problems:   HYPERCHOLESTEROLEMIA   Essential hypertension   Elevated AST (SGOT)   Seizures (HCC)   SIRS (systemic inflammatory response syndrome) (HCC)   Acute metabolic encephalopathy   Polysubstance abuse (HCC)   AKI (acute kidney injury) (HCC)   History of stroke   Malnutrition of moderate degree   Acute Metabolic encephalopathy:  As per the husband at baseline,  patient had right sided weakness since many weeks but she is alert and oriented.  She has been somnolent  - Probably secondary to seizures / status epilepticus. MRI is negative for acute stroke.  CTA of the head and neck is negative for large vessel occlusion.  Continuous EEG  monitoring for 48 hours as per neurology.  This study is suggestive ofnon specificcortical dysfunction arising from lefthemisphere.No seizures ordefiniteepileptiform discharges were seen throughout the recording Lactic acid is wnl.  RPR and HIV screen is negative.  TSH wnl.  MRI shows Remote lacunar infarct in the bilateral centrum semiovale, corona radiata and right thalamus. Advanced chronic microvascular ischemic changes. Mild parenchymal volume loss, which might explain her right-sided weakness.   Continue with aspirin 325 rectally.  Continue with vimpat and keppra and ativan PRN.  Continue with seizure precautions.   Palliative care consulted as patient remains lethargic, minimal oral intake. Since she remains somnolent, neurology recommends. Repeat MRI brain for further evaluation.   Hypertension:  BP parameters well controlled.   Malnutrition of moderate degree:  Nutrition consulted.     Rhabdomyolysis:  Probably secondary to seizures/ status epilepticus.  Continue with IV fluids.  Repeat CK levels in the morning show improvement.    UDS is positive for cocaine,  SW consult for resources.   H/o alcohol abuse:  No signs of withdrawal.  Continue with CIWA protocol.   GERD:  Continue with PPI.    Hypokalemia Replaced, repeat level low.   Hypomagnesemia Replaced   Fever Elevated WBC count, and absolute neutrophil  Count, get UA and CXR,  Blood cultures ordered and pending.     DVT prophylaxis: SCD'S Code Status: full code.  Family Communication: none at bedside. Disposition:   Status is: Inpatient  Remains inpatient appropriate because:Ongoing diagnostic testing needed not appropriate for outpatient work up   Dispo: The patient is from: Home  Anticipated d/c is to: SNF              Patient currently is not medically stable to d/c.   Difficult to place patient No       Consultants:   NEUROLOGY.    Procedures:  EEG   Antimicrobials: none.    Subjective: Pt somnolent, but intermittently wakes up, gets agitated.   Objective: Vitals:   08/27/20 0405 08/27/20 0719 08/27/20 0836 08/27/20 1000  BP: 137/90 (!) 151/96 137/90   Pulse: 81 (!) 109 94   Resp: (!) 22 (!) 26 20   Temp: 97.9 F (36.6 C) 98 F (36.7 C) 98.1 F (36.7 C)   TempSrc: Axillary Axillary Axillary   SpO2: 100% 97% 98%   Weight:      Height:    5\' 7"  (1.702 m)    Intake/Output Summary (Last 24 hours) at 08/27/2020 1230 Last data filed at 08/27/2020 0448 Gross per 24 hour  Intake --  Output 1000 ml  Net -1000 ml   Filed Weights   08/23/20 1300  Weight: 56.6 kg    Examination:  General exam: elderly lady, disheveled appearance. Not in distress.  Respiratory system:  Air entry fair , no wheezing heard.  Cardiovascular system: S1s2 RRR, no JVD, no pedal edema.  Gastrointestinal system: Abdomen is soft, non tender non distended bowel sounds wnl.  Central nervous system:  Extremities: no pedal edema.  Skin: No rashes seen Psychiatry: Cannot be assessed    Data Reviewed: I have personally reviewed following labs and imaging studies  CBC: Recent Labs  Lab 08/23/20 1330 08/23/20 1335 08/25/20 0931 08/27/20 1102  WBC 11.8*  --  9.2 11.3*  NEUTROABS 9.2*  --  7.2 8.9*  HGB 13.7 14.3 13.2 12.6  HCT 40.6 42.0 38.9 36.5  MCV 94.6  --  93.7 89.9  PLT 382  --  304 312    Basic Metabolic Panel: Recent Labs  Lab 08/23/20 1330 08/23/20 1335 08/24/20 0330 08/25/20 0931 08/26/20 0247 08/27/20 1102  NA 135 136  --  135 135  --   K 4.2 4.2  --  3.1* 3.1*  --   CL 105 104  --  106 104  --   CO2 21*  --   --  21* 22  --   GLUCOSE 127* 127*  --  105* 106*  --   BUN 13 17  --  8 6*  --   CREATININE 1.21* 1.10*  --  0.87 0.92  --   CALCIUM 9.4  --   --  8.7* 8.7*  --   MG  --   --  1.9  --   --  1.6*  PHOS  --   --  3.0  --   --  2.9    GFR: Estimated Creatinine Clearance: 57.4 mL/min (by C-G formula based  on SCr of 0.92 mg/dL).  Liver Function Tests: Recent Labs  Lab 08/23/20 1330  AST 56*  ALT 18  ALKPHOS 73  BILITOT 1.0  PROT 7.5  ALBUMIN 3.7    CBG: Recent Labs  Lab 08/23/20 1329 08/26/20 2025  GLUCAP 126* 97     Recent Results (from the past 240 hour(s))  Resp Panel by RT-PCR (Flu A&B, Covid) Nasopharyngeal Swab     Status: None   Collection Time: 08/23/20  5:09 PM   Specimen: Nasopharyngeal Swab; Nasopharyngeal(NP) swabs in vial transport medium  Result Value Ref Range Status   SARS Coronavirus 2 by  RT PCR NEGATIVE NEGATIVE Final    Comment: (NOTE) SARS-CoV-2 target nucleic acids are NOT DETECTED.  The SARS-CoV-2 RNA is generally detectable in upper respiratory specimens during the acute phase of infection. The lowest concentration of SARS-CoV-2 viral copies this assay can detect is 138 copies/mL. A negative result does not preclude SARS-Cov-2 infection and should not be used as the sole basis for treatment or other patient management decisions. A negative result may occur with  improper specimen collection/handling, submission of specimen other than nasopharyngeal swab, presence of viral mutation(s) within the areas targeted by this assay, and inadequate number of viral copies(<138 copies/mL). A negative result must be combined with clinical observations, patient history, and epidemiological information. The expected result is Negative.  Fact Sheet for Patients:  BloggerCourse.com  Fact Sheet for Healthcare Providers:  SeriousBroker.it  This test is no t yet approved or cleared by the [place] FDA and  has been authorized for detection and/or diagnosis of SARS-CoV-2 by FDA under an Emergency Use Authorization (EUA). This EUA will remain  in effect (meaning this test can be used) for the duration of the COVID-19 declaration under Section 564(b)(1) of the Act, 21 U.S.C.section 360bbb-3(b)(1), unless the  authorization is terminated  or revoked sooner.       Influenza A by PCR NEGATIVE NEGATIVE Final   Influenza B by PCR NEGATIVE NEGATIVE Final    Comment: (NOTE) The Xpert Xpress SARS-CoV-2/FLU/RSV plus assay is intended as an aid in the diagnosis of influenza from Nasopharyngeal swab specimens and should not be used as a sole basis for treatment. Nasal washings and aspirates are unacceptable for Xpert Xpress SARS-CoV-2/FLU/RSV testing.  Fact Sheet for Patients: BloggerCourse.com  Fact Sheet for Healthcare Providers: SeriousBroker.it  This test is not yet approved or cleared by the [place] FDA and has been authorized for detection and/or diagnosis of SARS-CoV-2 by FDA under an Emergency Use Authorization (EUA). This EUA will remain in effect (meaning this test can be used) for the duration of the COVID-19 declaration under Section 564(b)(1) of the Act, 21 U.S.C. section 360bbb-3(b)(1), unless the authorization is terminated or revoked.  Performed at [facility], [address], [place], [place] [address]          Radiology Studies: No results found.      Scheduled Meds: . aspirin  300 mg Rectal Daily  . enoxaparin (LOVENOX) injection  40 mg Subcutaneous Q24H  . feeding supplement  237 mL Oral TID with meals  . folic acid  1 mg Oral Daily  . multivitamin with minerals  1 tablet Oral Daily  . pantoprazole (PROTONIX) IV  40 mg Intravenous Q24H  . thiamine  100 mg Oral Daily   Or  . thiamine  100 mg Intravenous Daily   Continuous Infusions: . sodium chloride Stopped (08/26/20 0439)  . dextrose 5 % and 0.9% NaCl 75 mL/hr at 08/27/20 1027  . lacosamide (VIMPAT) IV 200 mg (08/27/20 1100)  . levETIRAcetam 1,500 mg (08/27/20 1027)  . magnesium sulfate bolus IVPB       LOS: 4 days       Kathlen Mody, MD [facility]   To contact the attending provider between 7A-7P or the covering  provider during after hours 7P-7A, please log into the web site [email or web address] and access using universal [place] password for that web site. If you do not have the password, please call the hospital operator.  08/27/2020, 12:30 PM

## 2020-08-27 NOTE — Progress Notes (Signed)
Subjective: More awake today  Exam: Vitals:   08/27/20 0719 08/27/20 0836  BP: (!) 151/96 137/90  Pulse: (!) 109 94  Resp: (!) 26 20  Temp: 98 F (36.7 C) 98.1 F (36.7 C)  SpO2: 97% 98%   Gen: In bed, NAD Resp: non-labored breathing, no acute distress Abd: soft, nt  Neuro: MS: Awake, some incoherent mumbling, does follow some simple commands(squeeze hands) QI:WLNLGXQ and tracks Motor: She appears to have some right sided weakness, but with lack of any cooperation, unclear to what extent. She withdraws bilaterally.   Pertinent Labs: Na 132 Ammonia 32 TSH 1.95  Impression: 60 year old female who is likely improved status epilepticus with persistent deficits after cessation of status epilepticus.  With prolonged EEG with no ongoing seizures, I suspect that her current exam represents cerebral insult due to prolonged status epilepticus.  I would expect her exam to continue to gradually improve, possibly incompletely.  Recommendations: 1) continue Vimpat 200 twice daily 2) continue Keppra 1500 twice daily 3) neurology will follow   Ritta Slot, MD [facility] [phone]  If 7pm- 7am, please page neurology on call as listed in AMION.

## 2020-08-27 NOTE — PMR Pre-admission (Signed)
PMR Admission Coordinator Pre-Admission Assessment  Patient: Cindy [name] is an 60 y.o., female MRN: [id number] DOB: [birth year] Height: $RemoveBeforeDE'5\' 7"'[id number]$  (170.2 cm) Weight: 56.6 kg  Insurance Information HMO:     PPO:      PCP:      IPA:      80/20:      OTHER:  PRIMARY: Medicaid of [place]    Policy#: [id number]      Subscriber: Pt CM Name:            Phone#:     Fax#:  Pre-Cert#:       Employer: Not employed Benefits:  Phone #:  [phone]     Name: Verified in [employer]. Date: Eligible 08/30/20 with coverage code MADMN     Deduct:        Out of Pocket Max:        Life Max:   CIR:        SNF:   Outpatient:       Co-Pay:   Home Health:        Co-Pay:   DME:       Co-Pay:   Providers:    SECONDARY:      Policy#:      Phone#:   Development worker, community:       Phone#:   The Engineer, [employer]" for patients in Inpatient Rehabilitation Facilities with attached "Privacy Act [place] Records" was provided and verbally reviewed with: Family  Emergency Contact Information Contact Information    Name Relation Home Work Mobile   [place] Spouse [phone]  [phone]   Roselind, Klus [phone]  [phone]      Current Medical History  Patient Admitting Diagnosis: Acute metabolic encephalopathy 2/2 seizure  History of Present Illness: A 60 year old right-handed female history of hypertension, hyperlipidemia, bilateral cerebral convexity infarct frontal region receiving CIR 08/19/2019-08/27/2019 and maintained on Plavix, 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  Patient did have admissions 07/2019 for seizure as well as 01/2020.  Per chart review independent with assistive device prior to admission living with her spouse.  She does have supportive family in the area.  Presented 08/23/2020 with altered mental status/slurred speech, right-sided weakness as well as reports of shaking and jerking  episodes lasting 5 to 10 minutes.  Cranial CT/MRI scan negative for acute changes but did show remote lacunar infarct in the bilateral centrum semiovale, corona radiata and right thalamus.  CT angiogram head and neck no large vessel occlusion or significant stenosis.  CT cervical spine no acute cervical spine fracture.  Admission chemistries alcohol negative, WBC 11,800, glucose 127, creatinine 1.21, lactic acid 2.0, urine drug screen positive cocaine, CK 1002, ammonia 13.  EEG suggestive of cortical dysfunction arising from left hemisphere likely secondary to underlying structural abnormality post ictal state no seizure or definite epileptiform discharges.  Neurology follow-up currently maintained on Vimpat 200 mg twice daily as well as valproic acid 1000 mg twice daily.  Patient with intermittent bouts of agitation restlessness maintained on Seroquel.  Subcutaneous Lovenox for DVT prophylaxis.  Urine culture 40,000 multiple species completing course of empiric Keflex.  Follow-up CK improved 534.  Tolerating mechanical soft diet.  Therapy evaluations completed due to patient decreased functional mobility was admitted for a comprehensive rehab program  Complete NIHSS TOTAL: 5  Patient's medical record [facility] has been reviewed by the rehabilitation admission coordinator and physician.  Past Medical  History  Past Medical History:  Diagnosis Date  . Abnormal LFTs    Hx of  . CVA (cerebral vascular accident) (HCC) 2005  . Hyperlipidemia   . Hypertension   . Polysubstance dependence including opioid type drug, episodic abuse (HCC)    cocaine/etoh     Family History   family history includes Asthma in her mother; Bronchitis in her mother; Cancer in her father; Hypertension in her mother.  Prior Rehab/Hospitalizations Has the patient had prior rehab or hospitalizations prior to admission? Yes  Has the patient had major surgery during 100 days prior to admission?  No   Current Medications  Current Facility-Administered Medications:  .  acetaminophen (TYLENOL) tablet 650 mg, 650 mg, Oral, Q6H PRN, Ernie Avena, NP, 650 mg at 08/26/20 2018 .  amLODipine (NORVASC) tablet 5 mg, 5 mg, Oral, Daily, Danford, Earl Lites, MD, 5 mg at 08/31/20 1132 .  aspirin tablet 325 mg, 325 mg, Oral, Daily, Kathlen Mody, MD, 325 mg at 08/31/20 1132 .  atorvastatin (LIPITOR) tablet 40 mg, 40 mg, Oral, QHS, Danford, Christopher P, MD .  cephALEXin (KEFLEX) capsule 500 mg, 500 mg, Oral, Q8H, Danford, Earl Lites, MD, 500 mg at 08/31/20 0556 .  enoxaparin (LOVENOX) injection 40 mg, 40 mg, Subcutaneous, Q24H, Smith, Rondell A, MD, 40 mg at 08/30/20 1740 .  feeding supplement (ENSURE ENLIVE / ENSURE PLUS) liquid 237 mL, 237 mL, Oral, TID with meals, Kathlen Mody, MD, 237 mL at 08/31/20 1134 .  folic acid (FOLVITE) tablet 1 mg, 1 mg, Oral, Daily, Smith, Rondell A, MD, 1 mg at 08/31/20 1133 .  hydrALAZINE (APRESOLINE) injection 10 mg, 10 mg, Intravenous, Q4H PRN, Smith, Rondell A, MD .  lacosamide (VIMPAT) tablet 200 mg, 200 mg, Oral, BID, Charlsie Quest, MD, 200 mg at 08/31/20 1133 .  metoprolol succinate (TOPROL-XL) 24 hr tablet 50 mg, 50 mg, Oral, Daily, Danford, Earl Lites, MD, 50 mg at 08/31/20 1133 .  multivitamin with minerals tablet 1 tablet, 1 tablet, Oral, Daily, Madelyn Flavors A, MD, 1 tablet at 08/31/20 1132 .  pantoprazole (PROTONIX) EC tablet 40 mg, 40 mg, Oral, Daily, Danford, Earl Lites, MD, 40 mg at 08/31/20 1133 .  potassium chloride SA (KLOR-CON) CR tablet 40 mEq, 40 mEq, Oral, BID, Kathlen Mody, MD, 40 mEq at 08/31/20 1133 .  QUEtiapine (SEROQUEL) tablet 50 mg, 50 mg, Oral, QHS, Danford, Earl Lites, MD, 50 mg at 08/30/20 2100 .  QUEtiapine (SEROQUEL) tablet 50 mg, 50 mg, Oral, BID PRN, Alberteen Sam, MD, 50 mg at 08/31/20 1132 .  thiamine tablet 100 mg, 100 mg, Oral, Daily, 100 mg at 08/31/20 1132 **OR** [DISCONTINUED] thiamine  (B-1) injection 100 mg, 100 mg, Intravenous, Daily, Katrinka Blazing, Rondell A, MD, 100 mg at 08/29/20 0909 .  valproic acid (DEPAKENE) 250 MG/5ML solution 1,000 mg, 1,000 mg, Oral, BID, Charlsie Quest, MD, 1,000 mg at 08/31/20 1131  Patients Current Diet:  Diet Order            Diet - low sodium heart healthy           DIET SOFT Room service appropriate? Yes; Fluid consistency: Thin  Diet effective now                 Precautions / Restrictions Precautions Precautions: Fall Precaution Comments: Mittens Restrictions Weight Bearing Restrictions: No   Has the patient had 2 or more falls or a fall with injury in the past year? Yes  Prior Activity Level Limited  Community (1-2x/wk): Went out mostly for appointments since CVA in 2021  Prior Functional Level Self Care: Did the patient need help bathing, dressing, using the toilet or eating? Needed some help  Indoor Mobility: Did the patient need assistance with walking from room to room (with or without device)? Needed some help  Stairs: Did the patient need assistance with internal or external stairs (with or without device)? Needed some help  Functional Cognition: Did the patient need help planning regular tasks such as shopping or remembering to take medications? Needed some help  Home Assistive Devices / Equipment    Prior Device Use: Indicate devices/aids used by the patient prior to current illness, exacerbation or injury? Walker  Current Functional Level Cognition  Overall Cognitive Status: Difficult to assess Difficult to assess due to: Impaired communication Current Attention Level: Sustained Orientation Level: Oriented to person Following Commands: Follows one step commands inconsistently Safety/Judgement: Decreased awareness of deficits,Decreased awareness of safety General Comments: No verbalizations this session. Difficult to assess cognition due to impaired communication and lethargy    Extremity  Assessment (includes Sensation/Coordination)  Upper Extremity Assessment: RUE deficits/detail,LUE deficits/detail RUE Deficits / Details: active movement noted but unable to hold against gravity. pt with decrease sensation and awareness of arm in space RUE Sensation: decreased proprioception,decreased light touch RUE Coordination: decreased gross motor,decreased fine motor LUE Deficits / Details: pushing with L UE, not following commands with hand, pt does reach for rail with hand over hand placement, pt able to reach against gravity LUE Coordination: decreased fine motor,decreased gross motor  Lower Extremity Assessment: Defer to PT evaluation,RLE deficits/detail RLE Deficits / Details: decreased sensation compared to painful stimuli withdrawal to LLE    ADLs  Overall ADL's : Needs assistance/impaired Eating/Feeding: Set up,Bed level Eating/Feeding Details (indicate cue type and reason): Patient able to drink from a cup with a straw. Initially therapist controlled the bup but on second trial patient able to hold cup and bring straw to mouth. Patient able to eat half a cookie. Grooming: Wash/dry face,Bed level,Set up Grooming Details (indicate cue type and reason): Patient able to wash face with setup with fairly good quality. General ADL Comments: requires (A) for all adls.    Mobility  Overal bed mobility: Needs Assistance Bed Mobility: Rolling,Supine to Sit,Sit to Supine Rolling: Max assist Supine to sit: Max assist,+2 for safety/equipment,+2 for physical assistance Sit to supine: Max assist,+2 for physical assistance,+2 for safety/equipment General bed mobility comments: Patient able to initiate trunk elevation but overall maxA+2 to complete bed mobility. Upon sitting EOB, able to maintain sitting balance with min guard, however severe trunk and neck flexion and inability to keep upright posture without maxA    Transfers  Overall transfer level: Needs assistance Equipment used:  Rolling walker (2 wheeled) Transfers: Sit to/from [employer] Sit to Stand: Mod assist,+2 physical assistance,+2 safety/equipment Stand pivot transfers: Mod assist,+2 physical assistance,+2 safety/equipment General transfer comment: deferred due to lethargy    Ambulation / Gait / Stairs / Office manager / Balance Dynamic Sitting Balance Sitting balance - Comments: close min guard to sit edge of bed Balance Overall balance assessment: Needs assistance Sitting-balance support: Feet supported Sitting balance-Leahy Scale: Fair Sitting balance - Comments: close min guard to sit edge of bed Standing balance support: During functional activity Standing balance-Leahy Scale: Poor Standing balance comment: needed external support    Special needs/care consideration N/A   Previous Home Environment (from acute therapy documentation) Additional Comments: Pt  poor historian - family not immediately available for dispo planning  Discharge Living Setting Plans for Discharge Living Setting: Patient's home Type of Home at Discharge: House Discharge Home Layout: One level Discharge Home Access: Ramped entrance Entrance Stairs-Rails: None Discharge Bathroom Shower/Tub: Walk-in shower Discharge Bathroom Toilet: Standard Discharge Lewis Accessibility: Yes How Accessible: Accessible via walker  Social/Family/Support Systems Patient Roles: Spouse Contact Information: [phone] Anticipated Caregiver: Ricky Doan (husband) Anticipated Caregiver's Contact Information: [phone] Ability/Limitations of Caregiver: Can provide mod A Caregiver Availability: 24/7 Discharge Plan Discussed with Primary Caregiver: Yes Is Caregiver In Agreement with Plan?: Yes  Goals Patient/Family Goal for Rehab: PT/OT Min A, SLP mod A Expected length of stay: 14-18 days Pt/Family Agrees to Admission and willing to participate: Yes Program Orientation Provided & Reviewed with  Pt/Caregiver Including Roles  & Responsibilities: Yes  Decrease burden of Care through IP rehab admission: N/A  Possible need for SNF placement upon discharge: Not anticipated  Patient Condition: I have reviewed medical records from [facility], spoken with CM, and patient and spouse. I met with patient at the bedside for inpatient rehabilitation assessment.  Patient will benefit from ongoing PT, OT and SLP, can actively participate in 3 hours of therapy a day 5 days of the week, and can make measurable gains during the admission.  Patient will also benefit from the coordinated team approach during an Inpatient Acute Rehabilitation admission.  The patient will receive intensive therapy as well as Rehabilitation physician, nursing, social worker, and care management interventions.  Due to bladder management, bowel management, safety, skin/wound care, disease management, medication administration, pain management and patient education the patient requires 24 hour a day rehabilitation nursing.  The patient is currently mod assist with mobility and basic ADLs.  Discharge setting and therapy post discharge at home with home health is anticipated.  Patient has agreed to participate in the Acute Inpatient Rehabilitation Program and will admit today.  Preadmission Screen Completed By:  Retta Diones, 08/31/2020 11:55 AM ______________________________________________________________________   Discussed status with Dr. Ranell Patrick and Dr. Naaman Plummer  on 08/31/20 at 0930 and received approval for admission today.  Admission Coordinator:  Retta Diones, RN, time 1154/Date 08/31/20   Assessment/Plan: Diagnosis: Enceophalopathy 1. Does the need for close, 24 hr/day Medical supervision in concert with the patient's rehab needs make it unreasonable for this patient to be served in a less intensive setting? Yes 2. Co-Morbidities requiring supervision/potential complications:  1. Seizures 2. Polysubstance  abuse 3. Hypercholesterolemia 4. AKI 5. History of prior stroke 3. Due to bladder management, bowel management, safety, skin/wound care, disease management, medication administration, pain management and patient education, does the patient require 24 hr/day rehab nursing? Yes 4. Does the patient require coordinated care of a physician, rehab nurse, PT, OT, and SLP to address physical and functional deficits in the context of the above medical diagnosis(es)? Yes Addressing deficits in the following areas: balance, endurance, locomotion, strength, transferring, bowel/bladder control, bathing, dressing, feeding, grooming, toileting, cognition, speech, swallowing and psychosocial support 5. Can the patient actively participate in an intensive therapy program of at least 3 hrs of therapy 5 days a week? Yes 6. The potential for patient to make measurable gains while on inpatient rehab is good 7. Anticipated functional outcomes upon discharge from inpatient rehab: mod assist PT, mod assist OT, mod assist SLP 8. Estimated rehab length of stay to reach the above functional goals is: 2-3 weeks 9. Anticipated discharge destination: Home 10. Overall Rehab/Functional Prognosis: good  MD Signature: Leeroy Cha, MD

## 2020-08-27 NOTE — Progress Notes (Signed)
Late Entry  08/24/20 1000  SLP Visit Information  SLP Received On 08/24/20  General Information  HPI Pt presents with acute metabolic encephalopathy secondary to suspected seizure with possible status epilepticus: Patient found to be acutely altered with intermittent shaking, urinary incontinence, and left gaze preference at home.  CTA of the head and neck negative for any large vessel occlusion.  MRI did not show any acute signs of a infarct, chronic infarct consistent with 2021 finding. In 2021 pt fully verbal, primarily working on cognitive goals. progresed from dys1/nectar to mech soft thin that admission.  Patient has been placed on continuous EEG monitoring, but no initial seizure activity appreciated.  Type of Study Bedside Swallow Evaluation  Previous Swallow Assessment see HPI  Diet Prior to this Study NPO  Temperature Spikes Noted No  Respiratory Status Room air  History of Recent Intubation No  Behavior/Cognition Alert;Requires cueing;Distractible  Oral Care Completed by SLP No  Oral Cavity - Dentition Dentures, top;Edentulous  Self-Feeding Abilities Needs assist  Patient Positioning Upright in bed  Baseline Vocal Quality Normal  Volitional Cough Cognitively unable to elicit  Oral Motor/Sensory Function  Overall Oral Motor/Sensory Function Other (comment) (could not follow commands)  Ice Chips  Ice chips NT  Thin Liquid  Thin Liquid WFL  Presentation Straw;Self Fed  Nectar Thick Liquid  Nectar Thick Liquid NT  Honey Thick Liquid  Honey Thick Liquid NT  Puree  Puree WFL  Solid  Solid WFL  SLP Assessment  Clinical Impression Statement (ACUTE ONLY) Pt demonstrates severely impaired language, right hemiparesis. Severe aphasia was not documented to be present in CIR notes from April 2021, so suspect pt is acutely impaired though yesterdays MRI was negative. Regardless pt is able to take consectuive swallows of water from a straw and self feed and masticate soft finger foods.  She is unable to use utensils due to right sided paresis and right neglect. Will f/u for toelrance of finger foods diet and thin liquids and cognitive linguistic eval as needed.  SLP Visit Diagnosis Dysphagia, unspecified (R13.10)  Impact on safety and function Mild aspiration risk  Swallow Evaluation Recommendations  SLP Diet Recommendations Other (Comment);Thin liquid (finger foods)  Liquid Administration via Cup;Straw  Medication Administration Whole meds with puree  Supervision Staff to assist with self feeding  Compensations Minimize environmental distractions  Postural Changes Seated upright at 90 degrees  Treatment Plan  Oral Care Recommendations Oral care BID  Treatment Recommendations Therapy as outlined in treatment plan below  Follow up Recommendations Inpatient Rehab  Speech Therapy Frequency (ACUTE ONLY) min 2x/week  Treatment Duration 2 weeks  Prognosis  Prognosis for Safe Diet Advancement Good  Individuals Consulted  Consulted and Agree with Results and Recommendations Patient unable/family or caregiver not available;RN  Progression Toward Goals  Progression toward goals Progressing toward goals  SLP Time Calculation  SLP Start Time (ACUTE ONLY) 1050  SLP Stop Time (ACUTE ONLY) 1106  SLP Time Calculation (min) (ACUTE ONLY) 16 min  SLP Evaluations  $ SLP Speech Visit 1 Visit  SLP Evaluations  $BSS Swallow 1 Procedure

## 2020-08-28 ENCOUNTER — Inpatient Hospital Stay (HOSPITAL_COMMUNITY): Payer: Medicaid Other

## 2020-08-28 LAB — PHOSPHORUS: Phosphorus: 2.7 mg/dL (ref 2.5–4.6)

## 2020-08-28 LAB — MAGNESIUM: Magnesium: 1.9 mg/dL (ref 1.7–2.4)

## 2020-08-28 LAB — CBC
HCT: 33.1 % — ABNORMAL LOW (ref 36.0–46.0)
Hemoglobin: 11.5 g/dL — ABNORMAL LOW (ref 12.0–15.0)
MCH: 31.7 pg (ref 26.0–34.0)
MCHC: 34.7 g/dL (ref 30.0–36.0)
MCV: 91.2 fL (ref 80.0–100.0)
Platelets: 316 10*3/uL (ref 150–400)
RBC: 3.63 MIL/uL — ABNORMAL LOW (ref 3.87–5.11)
RDW: 11.5 % (ref 11.5–15.5)
WBC: 9 10*3/uL (ref 4.0–10.5)
nRBC: 0 % (ref 0.0–0.2)

## 2020-08-28 LAB — BASIC METABOLIC PANEL
Anion gap: 6 (ref 5–15)
BUN: <5 mg/dL — ABNORMAL LOW (ref 8–23)
CO2: 22 mmol/L (ref 22–32)
Calcium: 8.8 mg/dL — ABNORMAL LOW (ref 8.9–10.3)
Chloride: 106 mmol/L (ref 98–111)
Creatinine, Ser: 0.81 mg/dL (ref 0.44–1.00)
GFR, Estimated: >60 mL/min (ref >60)
Glucose, Bld: 135 mg/dL — ABNORMAL HIGH (ref 70–99)
Potassium: 3.9 mmol/L (ref 3.5–5.1)
Sodium: 134 mmol/L — ABNORMAL LOW (ref 135–145)

## 2020-08-28 IMAGING — MR MR HEAD W/O CM
11 series · 48 of 48 positions shown · non-contrast
Comparison: Brain MRI [DATE] and earlier.

CLINICAL DATA: 59-year-old female code stroke presentation on
[DATE] with no acute intracranial abnormality by MRI at that
time. Unexplained altered mental status.

EXAM:
MRI HEAD WITHOUT CONTRAST
TECHNIQUE: Multiplanar, multiecho pulse sequences of the brain and surrounding
structures were obtained without intravenous contrast.

[Series 5: DWI · axial · 3.0mm · 0.88mm/px · z∈[-114,+33]mm · 9 of 100 slices shown (1 of 4)]
[im 1/100]
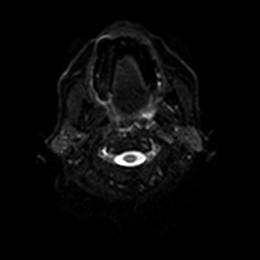
[im 13/100]
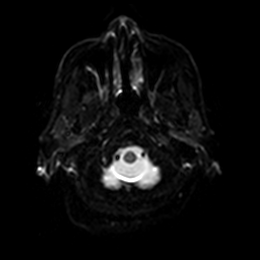
[im 25/100]
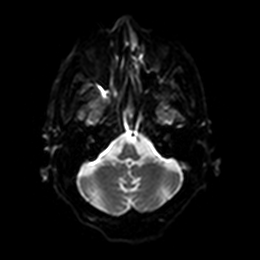
[im 38/100]
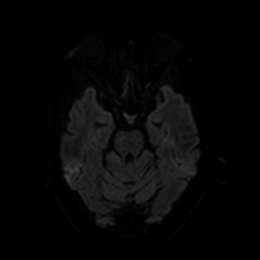
[im 50/100]
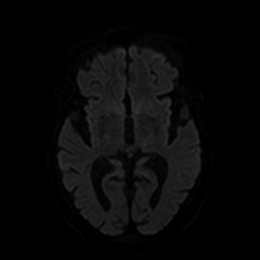
[im 62/100]
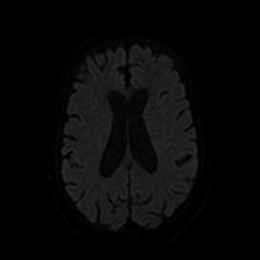
[im 75/100]
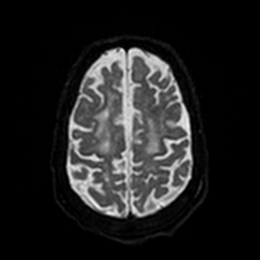
[im 87/100]
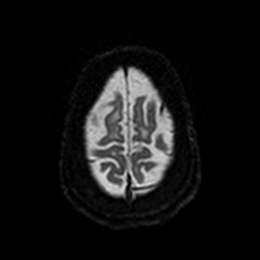
[im 100/100]
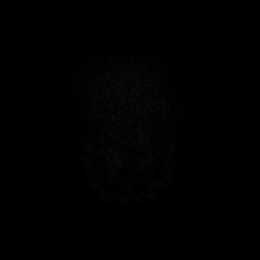

[Series 6: DWI · axial · 3.0mm · 0.88mm/px · z∈[-114,+33]mm · 5 of 49 slices shown (2 of 4)]
[im 1/49]
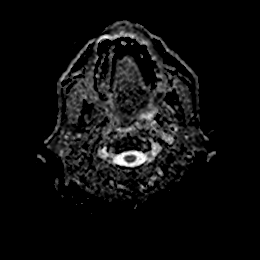
[im 13/49]
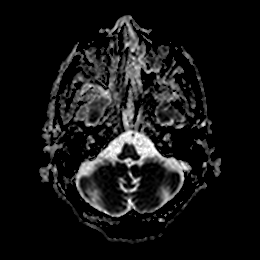
[im 25/49]
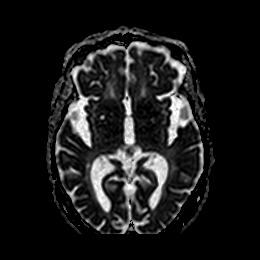
[im 37/49]
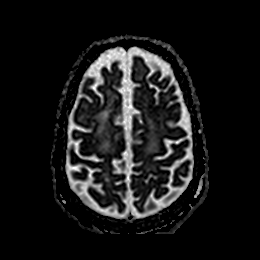
[im 49/49]
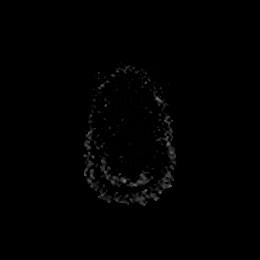

[Series 7: DWI · coronal · 4.0mm · 0.88mm/px · 7 of 74 slices shown (3 of 4)]
[im 1/74]
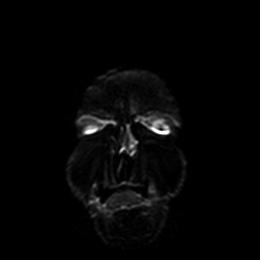
[im 13/74]
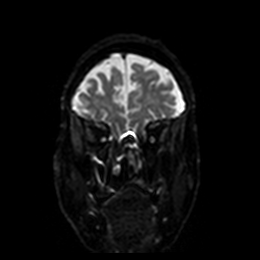
[im 25/74]
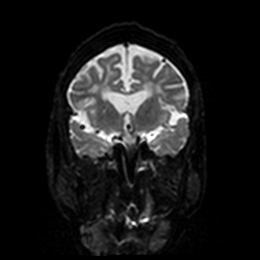
[im 37/74]
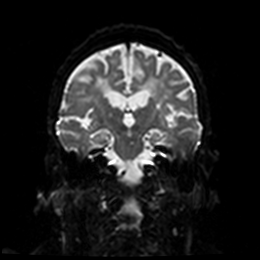
[im 49/74]
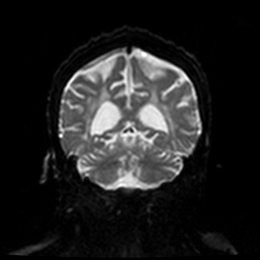
[im 61/74]
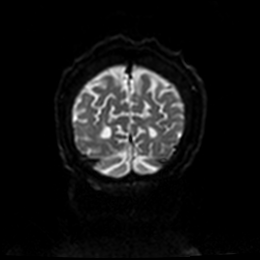
[im 74/74]
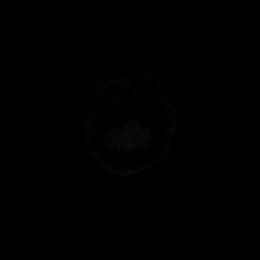

[Series 8: DWI · coronal · 4.0mm · 0.88mm/px · 3 of 37 slices shown (4 of 4)]
[im 1/37]
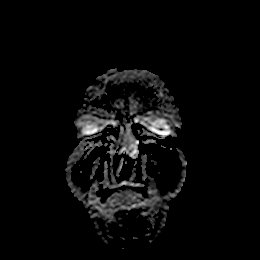
[im 19/37]
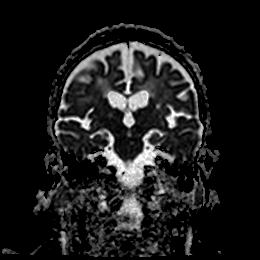
[im 37/37]
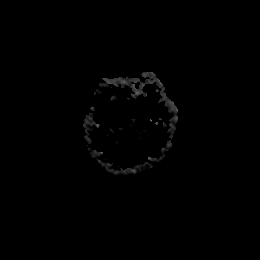

[Series 9: T1 · sagittal · 5.0mm · 0.75mm/px · 2 of 23 slices shown]
[im 1/23]
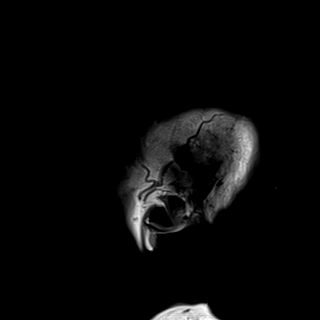
[im 23/23]
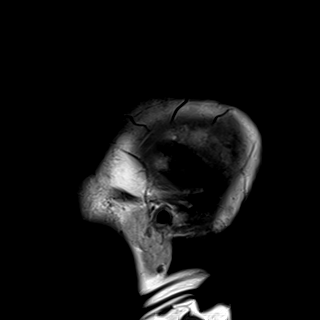

[Series 10: T2 · axial · 5.0mm · 0.72mm/px · z∈[-112,+31]mm · 2 of 25 slices shown]
[im 1/25]
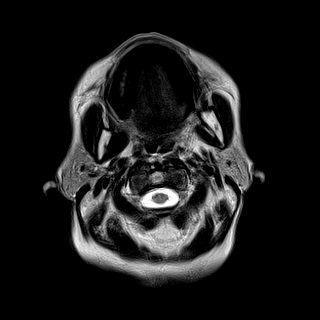
[im 25/25]
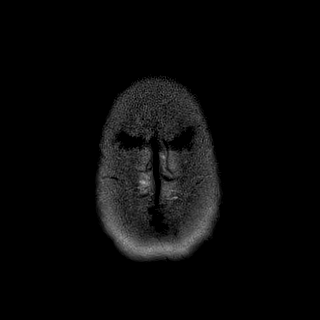

[Series 11: FLAIR · axial · 5.0mm · 0.45mm/px · z∈[-113,+31]mm · 2 of 25 slices shown]
[im 1/25]
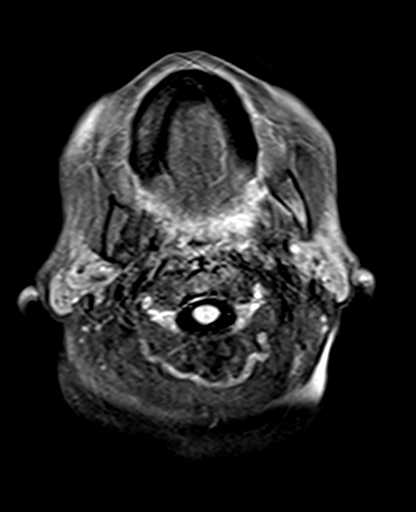
[im 25/25]
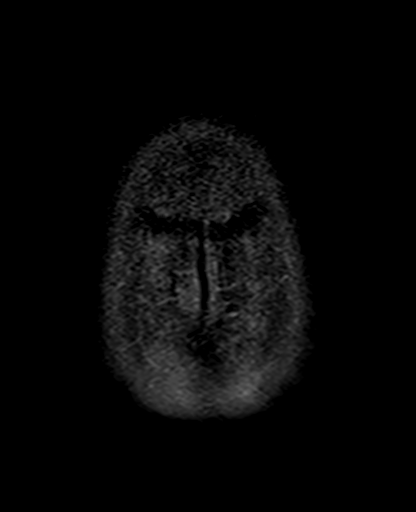

[Series 12: mag_images · axial · 3.0mm · 0.90mm/px · z∈[-117,+35]mm · 5 of 52 slices shown]
[im 1/52]
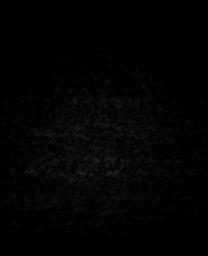
[im 13/52]
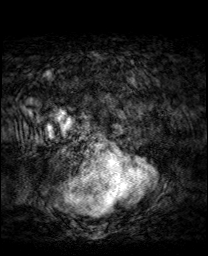
[im 26/52]
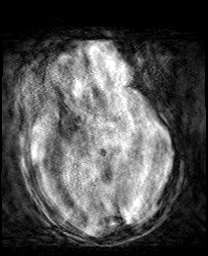
[im 39/52]
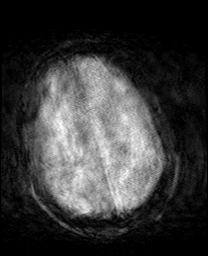
[im 52/52]
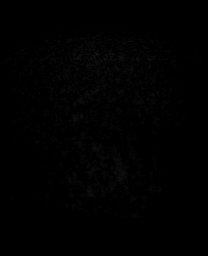

[Series 13: pha_images · axial · 3.0mm · 0.90mm/px · z∈[-102,+29]mm · 4 of 39 slices shown]
[im 1/39]
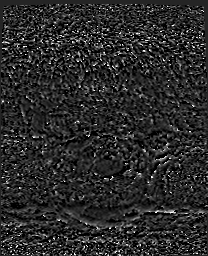
[im 13/39]
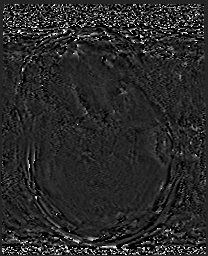
[im 26/39]
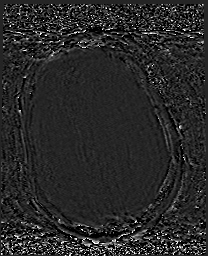
[im 39/39]
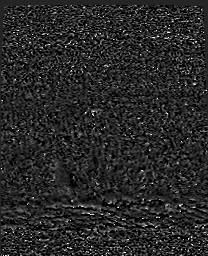

[Series 14: swi_images · axial · 3.0mm · 0.90mm/px · z∈[-117,+35]mm · 5 of 52 slices shown]
[im 1/52]
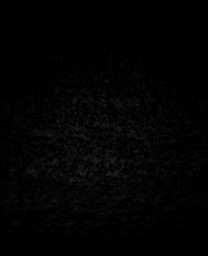
[im 13/52]
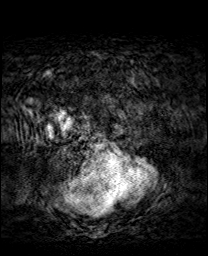
[im 26/52]
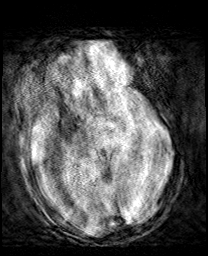
[im 39/52]
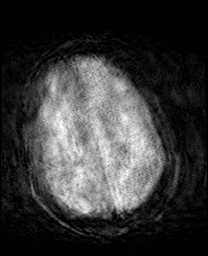
[im 52/52]
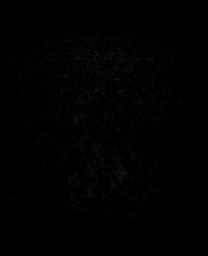

[Series 15: mip_images(sw) · axial · 24.0mm · 0.90mm/px · z∈[-107,+25]mm · 4 of 45 slices shown]
[im 1/45]
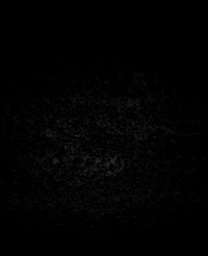
[im 15/45]
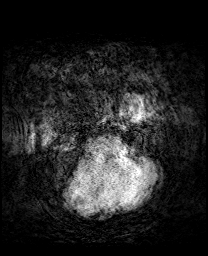
[im 30/45]
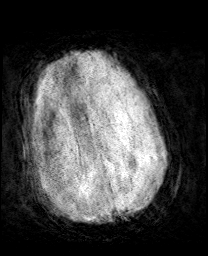
[im 45/45]
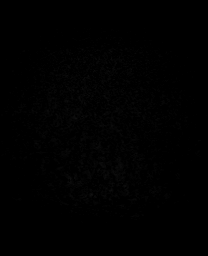

[48 of 48 positions shown; findings below may reference images not displayed]

FINDINGS: The examination had to be discontinued prior to completion due to
patient agitation. No axial T1 or coronal T2 weighted imaging could
be obtained. The remaining study is mildly degraded by motion.

Brain:

No restricted diffusion to suggest acute infarction. No midline
shift, mass effect, evidence of mass lesion, ventriculomegaly,
extra-axial collection or acute intracranial hemorrhage.
Cervicomedullary junction and pituitary are within normal limits.

Cerebral volume loss seems advanced for age, and perhaps
disproportionately involving the brainstem and cerebellum. There is
confluent bilateral cerebral white matter T2 and FLAIR
hyperintensity in a nonspecific configuration. Temporal lobes are
relatively spared. T2 heterogeneity in the bilateral deep gray
matter nuclei and left corona radiata most resembles chronic lacunar
infarcts. Comparatively mild T2 heterogeneity in the pons. Possible
small chronic lacunar infarct in the left superior cerebellum on
series 10, image 9.

No chronic cerebral blood products are evident. No cortical
encephalomalacia identified.

Vascular: *INSERT* flow void are stable. Mild generalized
intracranial artery tortuosity.

Skull and upper cervical spine: Partially visible cervical spine
degeneration including mild anterolisthesis of C3 on C4. Visualized
bone marrow signal is within normal limits.

Sinuses/Orbits: Increased mild bilateral paranasal sinus mucosal
thickening since last week. Orbits appear stable and negative.

Other: Stable mild mastoid effusions. Grossly normal visible
internal auditory structures.
IMPRESSION: 1. No acute intracranial abnormality. The examination had to be
discontinued prior to completion due to patient agitation.
2. Advanced but nonspecific cerebral white matter signal changes.
Favor small vessel disease related given evidence of chronic lacunar
infarcts in the left corona radiata, the bilateral deep gray matter
nuclei, and probably also the left cerebellum.
3. Mild new bilateral paranasal sinus inflammation.

## 2020-08-28 MED ORDER — ASPIRIN 325 MG PO TABS
325.0000 mg | ORAL_TABLET | Freq: Every day | ORAL | Status: DC
  Administered 2020-08-29 – 2020-08-31 (×3): 325 mg via ORAL
  Filled 2020-08-28 (×3): qty 1

## 2020-08-28 MED ORDER — ASPIRIN 300 MG RE SUPP
300.0000 mg | Freq: Once | RECTAL | Status: AC
  Administered 2020-08-28: 300 mg via RECTAL
  Filled 2020-08-28 (×2): qty 1

## 2020-08-28 MED ORDER — LORAZEPAM 2 MG/ML IJ SOLN
1.0000 mg | INTRAMUSCULAR | Status: DC | PRN
Start: 2020-08-28 — End: 2020-08-30
  Administered 2020-08-28: 1 mg via INTRAVENOUS
  Administered 2020-08-28: 2 mg via INTRAVENOUS
  Administered 2020-08-29: 4 mg via INTRAVENOUS
  Administered 2020-08-30: 2 mg via INTRAVENOUS
  Filled 2020-08-28: qty 1
  Filled 2020-08-28: qty 2
  Filled 2020-08-28 (×2): qty 1

## 2020-08-28 MED ORDER — LORAZEPAM 1 MG PO TABS
1.0000 mg | ORAL_TABLET | ORAL | Status: DC | PRN
Start: 2020-08-28 — End: 2020-08-30
  Administered 2020-08-29: 2 mg via ORAL
  Filled 2020-08-28: qty 2

## 2020-08-28 MED ORDER — SODIUM CHLORIDE 0.9 % IV SOLN
1.0000 g | INTRAVENOUS | Status: DC
  Administered 2020-08-28 – 2020-08-30 (×3): 1 g via INTRAVENOUS
  Filled 2020-08-28 (×3): qty 10

## 2020-08-28 MED ORDER — LORAZEPAM 2 MG/ML IJ SOLN: 1.0000 mg | INTRAMUSCULAR | Status: DC | PRN

## 2020-08-28 NOTE — Progress Notes (Signed)
Inpatient Rehab Admissions Coordinator:     Pt. Alert with her eyes open, but confused in mitten restraints. She is not appropriate for CIR at this time but I will continue to follow for potential admit if mentation improves.   Megan Salon, MS, CCC-SLP Rehab Admissions Coordinator  [phone] (celll) [phone] (office)

## 2020-08-28 NOTE — Progress Notes (Signed)
  Speech Language Pathology Treatment: Dysphagia  Patient Details Name: Cindy [name] MRN: [id number] DOB: 1960/06/11 Today's Date: 08/28/2020 Time: 4098-1191 SLP Time Calculation (min) (ACUTE ONLY): 23 min  Assessment / Plan / Recommendation Clinical Impression  SLP received order for repeat swallow evaluation.  Pt in room moving about the bed and verbalizing incomprehensible speech.  She did nod her head yes to questioning if desired food or drink.  After positioning, pt accepted only a few boluses of thin soda -  However - even after pt opens to accept intake, she repeats "hello" as if she is talking on the phone.  Inability to produce adequate pressure on straw for suctioning noted and SLP thus used tip of straw to provide intake.  At this time, [name] is disoriented with diminished capacity to safely consume po intake.  Recommend limit po to clears via tsp only and SlP will follow up in am.  Posted signs with precautions, educated pt and RN to recommendations.  Turned on music on computer to provide pt with some increased comfort/relaxation due to her agitation.  Pt verbalized agreement to music.     HPI HPI: Pt presents with acute metabolic encephalopathy secondary to suspected seizure with possible status epilepticus: Patient found to be acutely altered with intermittent shaking, urinary incontinence, and left gaze preference at home.  CTA of the head and neck negative for any large vessel occlusion.  MRI did not show any acute signs of a infarct, chronic infarct consistent with 2021 finding. In 2021 pt fully verbal, primarily working on cognitive goals. progresed from dys1/nectar to mech soft thin that admission.  Patient has been placed on continuous EEG monitoring, but no initial seizure activity appreciated.  Pt underwent BSE and was placed on a finger food/soft/thin diet.  Evaluation was reordered today - presumed due to AMS.      SLP Plan  Continue with current plan of care        Recommendations  Diet recommendations: Other(comment) (clears via tsp, necessary medication attempt with puree or ice cream) Liquids provided via: Teaspoon Medication Administration: Crushed with puree Supervision: Full supervision/cueing for compensatory strategies Compensations: Minimize environmental distractions;Other (Comment) (provide po intake only when pt is fully alert/accepting and able to elicit swallow) Postural Changes and/or Swallow Maneuvers: Seated upright 90 degrees                Oral Care Recommendations: Oral care BID Follow up Recommendations: Skilled Nursing facility SLP Visit Diagnosis: Dysphagia, oral phase (R13.11) Plan: Continue with current plan of care       GO             Rolena Infante, MS [facility] SLP Acute Rehab Services Office [phone] Pager [phone]     Chales Abrahams 08/28/2020, 10:37 PM

## 2020-08-28 NOTE — Progress Notes (Signed)
Adjusting orders and will recheck CIWA score.

## 2020-08-28 NOTE — Progress Notes (Signed)
Pt going down for MRI. 

## 2020-08-28 NOTE — Plan of Care (Signed)

## 2020-08-28 NOTE — Progress Notes (Signed)
PROGRESS NOTE    Cindy [name]  SHF:[id number] DOB: 09-05-1960 DOA: 08/23/2020 PCP: Avon Gully, MD    Chief Complaint  Patient presents with  . Code Stroke    Brief Narrative:  60 year old lady with prior h/o hypertension, hyperlipidemia, CVA, seizure, with prior h/o status epilepticus, polysubstance abuse ( use of cocaine and alcohol) admitted for altered mental status.   The patient's husband called EMS after finding her acutely altered, unresponsive with urinary incontinence, and reports of shaking/jerking episodes lasting 5 to 10 minutes on the floor since last night around 9 PM.  After each episode patient was reported to be better, but still have slurred speech and was not able to get up. Upon admission to the emergency department patient was seen as a code stroke and evaluated by neurology.  CT scan of the head was negative for any acute abnormalities.  CTA of the head neck showed no large vessel occlusion. She completed 48 hours of continuous EEG. This study is suggestive ofnon specificcortical dysfunction arising from lefthemisphere.No seizures ordefiniteepileptiform discharges were seen throughout the recording. Pt remains somnolent, opening eyes briefly with sternal rub but goes to sleep. Neurology consulted on admission, suggested she probably has cerebral insult  in view of her status epilepticus, with persistent deficits,. This morning she remains somnolent and so far has received 4 mg of IV ativan so far. A repeat MRI of the brain does not show any new acute strokes.  Palliative care consulted for goals of care.     Assessment & Plan:   Active Problems:   HYPERCHOLESTEROLEMIA   Essential hypertension   Elevated AST (SGOT)   Seizures (HCC)   SIRS (systemic inflammatory response syndrome) (HCC)   Acute metabolic encephalopathy   Polysubstance abuse (HCC)   AKI (acute kidney injury) (HCC)   History of stroke   Malnutrition of moderate degree   Acute Metabolic  encephalopathy:  As per the husband at baseline,  patient had right sided weakness since many weeks but she is alert and oriented.  - Probably secondary to  Cerebral insult from seizures / status epilepticus. MRI is negative for acute stroke , done twice.  CTA of the head and neck is negative for large vessel occlusion.   she completed Continuous EEG monitoring for 48 hours  Without any evidence of ongoing seizures. The study is  suggestive ofnon specificcortical dysfunction arising from lefthemisphere.No seizures ordefiniteepileptiform discharges were seen throughout the recording Lactic acid is wnl.  RPR and HIV screen is negative.  TSH wnl.  MRI shows Remote lacunar infarct in the bilateral centrum semiovale, corona radiata and right thalamus. Advanced chronic microvascular ischemic changes. Mild parenchymal volume loss, which might explain her right-sided weakness.   Continue with aspirin 325 orally if able to take.  Continue with vimpat 200 mg BID and keppra 1500 mg BID and ativan PRN.  Continue with seizure precautions.   Palliative care consulted as patient remains somnolent, not participating in therapy, minimal oral intake.    Hypertension:  BP parameters are optimal.   Malnutrition of moderate degree:  Nutrition consulted.     Rhabdomyolysis:  Probably secondary to seizures/ status epilepticus.  Continue with IV fluids.  Repeat CK levels in the morning show improvement.    UDS is positive for cocaine,  SW consult for resources.   H/o alcohol abuse:  Continue with CIWA protocol.   GERD:  Continue with PPI.    Hypokalemia Replaced, repeat level low.   Hypomagnesemia Replaced  UTI: Elevated WBC count, and absolute neutrophil  Count,. CXR showed basilar atelectasis.  Blood cultures ordered and pending. UA is abnormal, get urine cultures and start her on IV rocephin pending cultures.    DVT prophylaxis: SCD'S Code Status: full code.  Family  Communication: none at bedside. Disposition:   Status is: Inpatient  Remains inpatient appropriate because:Ongoing diagnostic testing needed not appropriate for outpatient work up   Dispo: The patient is from: Home              Anticipated d/c is to: SNF              Patient currently is not medically stable to d/c.   Difficult to place patient No       Consultants:   NEUROLOGY.    Procedures: EEG   Antimicrobials: none.    Subjective: Somnolent, but wakes up intermittently, and remains agitated.   Objective: Vitals:   08/27/20 2035 08/28/20 0047 08/28/20 0416 08/28/20 0833  BP: (!) 175/71 (!) 142/94 125/89 (!) 150/91  Pulse: 99 100 88 89  Resp: 20 20 19 20   Temp: 98.3 F (36.8 C) 98.3 F (36.8 C) 98 F (36.7 C) 98.4 F (36.9 C)  TempSrc: Axillary Oral Oral Axillary  SpO2: 100% 100% 99% 99%  Weight:      Height:        Intake/Output Summary (Last 24 hours) at 08/28/2020 1110 Last data filed at 08/28/2020 1024 Gross per 24 hour  Intake 3187.69 ml  Output 1000 ml  Net 2187.69 ml   Filed Weights   08/23/20 1300  Weight: 56.6 kg    Examination:  General exam: Elderly lady, disheveled in appearance, confused on waking up.  Respiratory system:  Diminished at bases. No wheezing heard. On RA Cardiovascular system: S1S2, RRR, no JVD,  Gastrointestinal system: Abdomen is soft, NT ND BS+ Central nervous system: confused, able to move all extremities spontaneously.  Extremities: No pedal edema.  Skin: No rashes seen.  Psychiatry: Cannot be assessed    Data Reviewed: I have personally reviewed following labs and imaging studies  CBC: Recent Labs  Lab 08/23/20 1330 08/23/20 1335 08/25/20 0931 08/27/20 1102 08/28/20 0444  WBC 11.8*  --  9.2 11.3* 9.0  NEUTROABS 9.2*  --  7.2 8.9*  --   HGB 13.7 14.3 13.2 12.6 11.5*  HCT 40.6 42.0 38.9 36.5 33.1*  MCV 94.6  --  93.7 89.9 91.2  PLT 382  --  304 312 316    Basic Metabolic Panel: Recent Labs   Lab 08/23/20 1330 08/23/20 1335 08/24/20 0330 08/25/20 0931 08/26/20 0247 08/27/20 1102 08/27/20 1426 08/28/20 0444  NA 135 136  --  135 135  --  132* 134*  K 4.2 4.2  --  3.1* 3.1*  --  2.7* 3.9  CL 105 104  --  106 104  --  103 106  CO2 21*  --   --  21* 22  --  20* 22  GLUCOSE 127* 127*  --  105* 106*  --  131* 135*  BUN 13 17  --  8 6*  --  8 <5*  CREATININE 1.21* 1.10*  --  0.87 0.92  --  0.84 0.81  CALCIUM 9.4  --   --  8.7* 8.7*  --  8.6* 8.8*  MG  --   --  1.9  --   --  1.6*  --  1.9  PHOS  --   --  3.0  --   --  2.9  --  2.7    GFR: Estimated Creatinine Clearance: 65.2 mL/min (by C-G formula based on SCr of 0.81 mg/dL).  Liver Function Tests: Recent Labs  Lab 08/23/20 1330  AST 56*  ALT 18  ALKPHOS 73  BILITOT 1.0  PROT 7.5  ALBUMIN 3.7    CBG: Recent Labs  Lab 08/23/20 1329 08/26/20 2025  GLUCAP 126* 97     Recent Results (from the past 240 hour(s))  Resp Panel by RT-PCR (Flu A&B, Covid) Nasopharyngeal Swab     Status: None   Collection Time: 08/23/20  5:09 PM   Specimen: Nasopharyngeal Swab; Nasopharyngeal(NP) swabs in vial transport medium  Result Value Ref Range Status   SARS Coronavirus 2 by RT PCR NEGATIVE NEGATIVE Final    Comment: (NOTE) SARS-CoV-2 target nucleic acids are NOT DETECTED.  The SARS-CoV-2 RNA is generally detectable in upper respiratory specimens during the acute phase of infection. The lowest concentration of SARS-CoV-2 viral copies this assay can detect is 138 copies/mL. A negative result does not preclude SARS-Cov-2 infection and should not be used as the sole basis for treatment or other patient management decisions. A negative result may occur with  improper specimen collection/handling, submission of specimen other than nasopharyngeal swab, presence of viral mutation(s) within the areas targeted by this assay, and inadequate number of viral copies(<138 copies/mL). A negative result must be combined with clinical  observations, patient history, and epidemiological information. The expected result is Negative.  Fact Sheet for Patients:  BloggerCourse.com  Fact Sheet for Healthcare Providers:  SeriousBroker.it  This test is no t yet approved or cleared by the [place] FDA and  has been authorized for detection and/or diagnosis of SARS-CoV-2 by FDA under an Emergency Use Authorization (EUA). This EUA will remain  in effect (meaning this test can be used) for the duration of the COVID-19 declaration under Section 564(b)(1) of the Act, 21 U.S.C.section 360bbb-3(b)(1), unless the authorization is terminated  or revoked sooner.       Influenza A by PCR NEGATIVE NEGATIVE Final   Influenza B by PCR NEGATIVE NEGATIVE Final    Comment: (NOTE) The Xpert Xpress SARS-CoV-2/FLU/RSV plus assay is intended as an aid in the diagnosis of influenza from Nasopharyngeal swab specimens and should not be used as a sole basis for treatment. Nasal washings and aspirates are unacceptable for Xpert Xpress SARS-CoV-2/FLU/RSV testing.  Fact Sheet for Patients: BloggerCourse.com  Fact Sheet for Healthcare Providers: SeriousBroker.it  This test is not yet approved or cleared by the [place] FDA and has been authorized for detection and/or diagnosis of SARS-CoV-2 by FDA under an Emergency Use Authorization (EUA). This EUA will remain in effect (meaning this test can be used) for the duration of the COVID-19 declaration under Section 564(b)(1) of the Act, 21 U.S.C. section 360bbb-3(b)(1), unless the authorization is terminated or revoked.  Performed at [facility], [address], [place], [place] [address]   Culture, blood (Routine X 2) w Reflex to ID Panel     Status: None (Preliminary result)   Collection Time: 08/27/20 11:02 AM   Specimen: BLOOD  Result Value Ref Range Status   Specimen  Description BLOOD BLOOD RIGHT HAND  Final   Special Requests   Final    BOTTLES DRAWN AEROBIC AND ANAEROBIC Blood Culture results may not be optimal due to an inadequate volume of blood received in culture bottles   Culture   Final    NO GROWTH < 24 HOURS Performed at  [facility], [address], [place], [place] [address]    Report Status PENDING  Incomplete  Culture, blood (Routine X 2) w Reflex to ID Panel     Status: None (Preliminary result)   Collection Time: 08/27/20 11:02 AM   Specimen: BLOOD  Result Value Ref Range Status   Specimen Description BLOOD LEFT ANTECUBITAL  Final   Special Requests   Final    BOTTLES DRAWN AEROBIC AND ANAEROBIC Blood Culture adequate volume   Culture   Final    NO GROWTH < 24 HOURS Performed at [facility], [address], [place], [place] [address]    Report Status PENDING  Incomplete         Radiology Studies: DG Chest 2 View  Result Date: 08/27/2020 CLINICAL DATA:  Fever. EXAM: CHEST - 2 VIEW COMPARISON:  08/23/2020 FINDINGS: Persistent low lung volumes. Stable heart size and mediastinal contours. Streaky left lung base atelectasis. There is a small left pleural effusion. No pneumothorax. Small bore catheter projects in the left supraclavicular region. The bones are under mineralized. Chronic right AC joint separation. IMPRESSION: Low lung volumes with streaky left lung base atelectasis and small left pleural effusion. Electronically Signed   By: Narda Rutherford M.D.   On: 08/27/2020 18:57   MR BRAIN WO CONTRAST  Result Date: 08/28/2020 CLINICAL DATA:  60 year old female code stroke presentation on 08/23/2020 with no acute intracranial abnormality by MRI at that time. Unexplained altered mental status. EXAM: MRI HEAD WITHOUT CONTRAST TECHNIQUE: Multiplanar, multiecho pulse sequences of the brain and surrounding structures were obtained without intravenous contrast. COMPARISON:  Brain MRI 08/23/2020 and earlier. FINDINGS: The  examination had to be discontinued prior to completion due to patient agitation. No axial T1 or coronal T2 weighted imaging could be obtained. The remaining study is mildly degraded by motion. Brain: No restricted diffusion to suggest acute infarction. No midline shift, mass effect, evidence of mass lesion, ventriculomegaly, extra-axial collection or acute intracranial hemorrhage. Cervicomedullary junction and pituitary are within normal limits. Cerebral volume loss seems advanced for age, and perhaps disproportionately involving the brainstem and cerebellum. There is confluent bilateral cerebral white matter T2 and FLAIR hyperintensity in a nonspecific configuration. Temporal lobes are relatively spared. T2 heterogeneity in the bilateral deep gray matter nuclei and left corona radiata most resembles chronic lacunar infarcts. Comparatively mild T2 heterogeneity in the pons. Possible small chronic lacunar infarct in the left superior cerebellum on series 10, image 9. No chronic cerebral blood products are evident. No cortical encephalomalacia identified. Vascular: *INSERT* flow void are stable. Mild generalized intracranial artery tortuosity. Skull and upper cervical spine: Partially visible cervical spine degeneration including mild anterolisthesis of C3 on C4. Visualized bone marrow signal is within normal limits. Sinuses/Orbits: Increased mild bilateral paranasal sinus mucosal thickening since last week. Orbits appear stable and negative. Other: Stable mild mastoid effusions. Grossly normal visible internal auditory structures. IMPRESSION: 1. No acute intracranial abnormality. The examination had to be discontinued prior to completion due to patient agitation. 2. Advanced but nonspecific cerebral white matter signal changes. Favor small vessel disease related given evidence of chronic lacunar infarcts in the left corona radiata, the bilateral deep gray matter nuclei, and probably also the left cerebellum. 3. Mild  new bilateral paranasal sinus inflammation. Electronically Signed   By: Odessa Fleming M.D.   On: 08/28/2020 08:08        Scheduled Meds: . aspirin  300 mg Rectal Daily  . enoxaparin (LOVENOX) injection  40 mg  Subcutaneous Q24H  . feeding supplement  237 mL Oral TID with meals  . folic acid  1 mg Oral Daily  . multivitamin with minerals  1 tablet Oral Daily  . pantoprazole (PROTONIX) IV  40 mg Intravenous Q24H  . potassium chloride  40 mEq Oral BID  . thiamine  100 mg Oral Daily   Or  . thiamine  100 mg Intravenous Daily   Continuous Infusions: . sodium chloride Stopped (08/26/20 0439)  . cefTRIAXone (ROCEPHIN)  IV 1 g (08/28/20 1027)  . dextrose 5 % and 0.9% NaCl 75 mL/hr at 08/28/20 0122  . lacosamide (VIMPAT) IV Stopped (08/28/20 1015)  . levETIRAcetam 1,500 mg (08/27/20 2146)     LOS: 5 days       Kathlen Mody, MD [facility]   To contact the attending provider between 7A-7P or the covering provider during after hours 7P-7A, please log into the web site [email or web address] and access using universal [place] password for that web site. If you do not have the password, please call the hospital operator.  08/28/2020, 11:10 AM

## 2020-08-28 NOTE — Progress Notes (Signed)
PT Cancellation Note  Patient Details Name: Cindy [name] MRN: [id number] DOB: 04/02/1961   Cancelled Treatment:    Reason Eval/Treat Not Completed: (P) Medical issues which prohibited therapy (Pt increasingly agitated with high resting HR.  RN deferred tx at this time.)   Theodore Rahrig Artis Delay 08/28/2020, 1:34 PM  Bonney Leitz , PTA Acute Rehabilitation Services Pager [phone] Office [phone]

## 2020-08-28 NOTE — Progress Notes (Signed)
Subjective: Still confused  Exam: Vitals:   08/28/20 1218 08/28/20 1538  BP: (!) 153/111 124/78  Pulse: 97 86  Resp: 18 18  Temp: 98.7 F (37.1 C) 98.3 F (36.8 C)  SpO2: 100% 98%   Gen: In bed, NAD Resp: non-labored breathing, no acute distress Abd: soft, nt  Neuro: MS: Awake, some incoherent mumbling, does follow some simple commands(squeeze hands) EY:CXKGYJE and tracks  Motor: She still appears to have some right sided weakness, but with lack of any cooperation, unclear to what extent. She withdraws bilaterally.   Pertinent Labs: Ammonia 32 TSH 1.95  Impression: 60 year old female who is likely improved status epilepticus with persistent deficits after cessation of status epilepticus.  With prolonged EEG with no ongoing seizures, I suspect that her current exam represents cerebral insult due to prolonged status epilepticus.  I would expect her exam to continue to gradually improve, possibly incompletely.  Recommendations: 1) continue Vimpat 200 twice daily 2) continue Keppra 1500 twice daily 3) neurology will follow   Ritta Slot, MD [facility] [phone]  If 7pm- 7am, please page neurology on call as listed in AMION.

## 2020-08-29 ENCOUNTER — Inpatient Hospital Stay (HOSPITAL_COMMUNITY): Payer: Medicaid Other

## 2020-08-29 NOTE — Progress Notes (Signed)
Inpatient Rehab Admissions Coordinator:   Pt. Seen at bedside this AM. She appears less confused than yesterday, though opening her eyes only briefly and mumbling incomprehensible speech. Pt.'s husband was present and confirms that he is able to provide 24/7 assist at discharge and is hoping that Pt. Will be able to come to CIR. PT to see pt. Today. I will follow to see how she does with therapies before moving forward with CIR admission.   Megan Salon, MS, CCC-SLP Rehab Admissions Coordinator  [phone] (celll) [phone] (office)

## 2020-08-29 NOTE — Progress Notes (Signed)
Physical Therapy Treatment Patient Details Name: Cindy [name] MRN: [id number] DOB: [birth year] Today's Date: 08/29/2020    History of Present Illness 60 yo female admitted with AMS .Pt with HTN and polysubstance abuse on arrival.  Current workup with L hemisphere seizure PMH HTN HLD CVA seizures Polysubstance    PT Comments    Pt resting upon arrival to room, wakes easily with PT and OT speaking to pt. Pt following repeated, multimodal commands but demonstrates significant difficulty sequencing tasks and processing cues quickly. Pt requiring mod +2 assist for bed mobility and repeated transfers, pt able to take a few steps laterally today but very limited by sequencing difficulty and weakness. PT continuing to recommend CIR post-acutely.    Follow Up Recommendations  CIR     Equipment Recommendations   (TBD next venue of care)    Recommendations for Other Services Rehab consult     Precautions / Restrictions Precautions Precautions: Fall Precaution Comments: elbow block splint over RUE IV, handmitts Restrictions Weight Bearing Restrictions: No    Mobility  Bed Mobility Overal bed mobility: Needs Assistance Bed Mobility: Supine to Sit Rolling: Mod assist   Supine to sit: Mod assist;+2 for physical assistance;+2 for safety/equipment Sit to supine: Mod assist;+2 for physical assistance;+2 for safety/equipment   General bed mobility comments: mod +2 for trunk and LE management, step-wise scooting to EOB, step-by-step cuing as pt with significant sequencing difficulty    Transfers Overall transfer level: Needs assistance Equipment used: Rolling walker (2 wheeled) Transfers: Sit to/from [employer] Sit to Stand: Mod assist;+2 physical assistance;+2 safety/equipment Stand pivot transfers: Mod assist;+2 physical assistance;+2 safety/equipment       General transfer comment: Mod +2 for power up, rise, steadying. cues for correct hand placement on RW, upright  posture, not followed well. STS x2, with lateral steps towards HOB x3 requiring mod-max +2 for weight shifting, RLE translation.  Ambulation/Gait                 Stairs             Wheelchair Mobility    Modified Rankin (Stroke Patients Only) Modified Rankin (Stroke Patients Only) Pre-Morbid Rankin Score: Slight disability Modified Rankin: Severe disability     Balance Overall balance assessment: Needs assistance Sitting-balance support: Feet supported Sitting balance-Leahy Scale: Fair Sitting balance - Comments: very close min guard to sit edge of bed   Standing balance support: During functional activity Standing balance-Leahy Scale: Poor Standing balance comment: needed external support                            Cognition Arousal/Alertness: Awake/alert (Initially drowsy but became alert with stimulation.) Behavior During Therapy: Restless;Impulsive Overall Cognitive Status: Impaired/Different from baseline Area of Impairment: Awareness;Safety/judgement;Following commands;Memory;Attention;Orientation                 Orientation Level: Person;Place Current Attention Level: Sustained Memory: Decreased short-term memory Following Commands: Follows one step commands inconsistently Safety/Judgement: Decreased awareness of deficits;Decreased awareness of safety Awareness: Emergent   General Comments: pt demonstrates difficulty verbalizing, requires very increased time and repeated attempts to communicate clearly with PT/OT. Pt perseverative on conversation topics and tasks ("my husband should be coming up", face washing). Requires short, one-step cues      Exercises      General Comments General comments (skin integrity, edema, etc.): HRmax 122 bpm during mobility      Pertinent Vitals/Pain Pain Assessment: No/denies pain Pain  Intervention(s): Limited activity within patient's tolerance;Monitored during session    Home Living                       Prior Function            PT Goals (current goals can now be found in the care plan section) Acute Rehab PT Goals Patient Stated Goal: none stated PT Goal Formulation: With patient Time For Goal Achievement: 09/09/20 Potential to Achieve Goals: Fair Progress towards PT goals: Progressing toward goals    Frequency    Min 4X/week      PT Plan Current plan remains appropriate    Co-evaluation PT/OT/SLP Co-Evaluation/Treatment: Yes Reason for Co-Treatment: For patient/therapist safety;To address functional/ADL transfers PT goals addressed during session: Mobility/safety with mobility;Balance;Strengthening/ROM OT goals addressed during session: ADL's and self-care      AM-PAC PT "6 Clicks" Mobility   Outcome Measure  Help needed turning from your back to your side while in a flat bed without using bedrails?: A Lot Help needed moving from lying on your back to sitting on the side of a flat bed without using bedrails?: A Lot Help needed moving to and from a bed to a chair (including a wheelchair)?: A Lot Help needed standing up from a chair using your arms (e.g., wheelchair or bedside chair)?: A Lot Help needed to walk in hospital room?: Total Help needed climbing 3-5 steps with a railing? : Total 6 Click Score: 10    End of Session Equipment Utilized During Treatment: Gait belt Activity Tolerance: Patient tolerated treatment well;Patient limited by fatigue Patient left: in bed;with call bell/phone within reach;with bed alarm set Nurse Communication: Mobility status PT Visit Diagnosis: Muscle weakness (generalized) (M62.81);Unsteadiness on feet (R26.81);History of falling (Z91.81);Difficulty in walking, not elsewhere classified (R26.2);Other symptoms and signs involving the nervous system (R29.898)     Time: 1342-1410 PT Time Calculation (min) (ACUTE ONLY): 28 min  Charges:  $Therapeutic Activity: 8-22 mins                    Marye Round, PT Acute  Rehabilitation Services Pager [phone]  Office [phone]   Truddie Coco 08/29/2020, 4:47 PM

## 2020-08-29 NOTE — Progress Notes (Signed)
[facility] PROGRESS NOTE    [name]  XVQ:[id number] DOB: 01-Jun-1960 DOA: 08/23/2020 PCP: Avon Gully, MD      Brief Narrative:  Cindy [name] is a 60 y.o. F with HTN, seizures, history of status epilepticus, history of polysubstance abuse of cocaine and alcohol, and cerebrovascular disease who presented with altered mental status.  Head unremarkable, no obvious source of infection.  Neurology were consulted and she was admitted for status epilepticus.        Assessment & Plan:  Status epilepticus -Continue Vimpat and Keppra  Acute metabolic encephalopathy due to status epilepticus Slowly resolving -Consult Neurology appreicate cares  Hypertension Cerebrovascular disease BP normal -Continue aspirin -Hold amlodipine, metoprolol  Severe protein calorie malnutrition As evidenced by BMI 19, loss of 8% body weight since last year when her weight was 61 kg, as well as severe decreased muscle mass and fat, encephalopathy leading up less than 50% oral intake  UTI -Continue Rocephin  Hypomagnesemia Hypomagnesemia Resovled with treatment  History of polysubstance abuse No evidence of withdrawal at this time -Continue thiamine and folate           Disposition: Status is: Inpatient  Remains inpatient appropriate because:Altered mental status   Dispo: The patient is from: Home              Anticipated d/c is to: SNF              Patient currently is not medically stable to d/c.   Difficult to place patient No       Level of care: Med-Surg       MDM: The below labs and imaging reports were reviewed and summarized above.  Medication management as above.  The patient has persistent neurological change.    DVT prophylaxis: enoxaparin (LOVENOX) injection 40 mg Start: 08/23/20 1715  Code Status: DNR Family Communication:          Subjective: Patient is delirious and agitated, but slowly improving.  No fever, vomiting,  respiratory distress.  No obvious pain complaints.  Objective: Vitals:   08/29/20 0600 08/29/20 0754 08/29/20 1134 08/29/20 1653  BP:  (!) 128/107 138/90 (!) 155/98  Pulse:  100 90 (!) 104  Resp: (!) 21 (!) 24 13 20   Temp:  98.1 F (36.7 C) 98.7 F (37.1 C) 98 F (36.7 C)  TempSrc:  Axillary Axillary Oral  SpO2:  99% 99%   Weight:      Height:        Intake/Output Summary (Last 24 hours) at 08/29/2020 1742 Last data filed at 08/29/2020 0251 Gross per 24 hour  Intake 849.35 ml  Output 650 ml  Net 199.35 ml   Filed Weights   08/23/20 1300  Weight: 56.6 kg    Examination: General appearance: Thin adult female, alert and in mild distress from delirium.   HEENT: Anicteric, conjunctiva pink, lids and lashes normal. No nasal deformity, discharge, epistaxis.  Lips moist, dentures falling out, oropharynx moist, no oral lesions.   Skin: Warm and dry.  No jaundice.  No suspicious rashes or lesions. Cardiac: Tachycardic, regular, nl S1-S2, no murmurs appreciated.  Capillary refill is brisk.  JVP normal  No LE edema.  Radial pulses 2+ and symmetric. Respiratory: Normal respiratory rate and rhythm.  CTAB without rales or wheezes. Abdomen: Abdomen soft.  No TTP or guarding. No ascites, distension, hepatosplenomegaly.   MSK: No deformities or effusions.  Diffuse loss of subcutaneous muscle mass and fat Neuro: Awake but agitated  and restless.  Lifts arms symmetrically, appears globally weak, does not follow commands consistently, speech is fluent but confused Psych: Sensorium intact appears inattentive, affect confused, judgment insight appear impaired     Data Reviewed: I have personally reviewed following labs and imaging studies:  CBC: Recent Labs  Lab 08/23/20 1330 08/23/20 1335 08/25/20 0931 08/27/20 1102 08/28/20 0444  WBC 11.8*  --  9.2 11.3* 9.0  NEUTROABS 9.2*  --  7.2 8.9*  --   HGB 13.7 14.3 13.2 12.6 11.5*  HCT 40.6 42.0 38.9 36.5 33.1*  MCV 94.6  --  93.7 89.9 91.2   PLT 382  --  304 312 316   Basic Metabolic Panel: Recent Labs  Lab 08/23/20 1330 08/23/20 1335 08/24/20 0330 08/25/20 0931 08/26/20 0247 08/27/20 1102 08/27/20 1426 08/28/20 0444  NA 135 136  --  135 135  --  132* 134*  K 4.2 4.2  --  3.1* 3.1*  --  2.7* 3.9  CL 105 104  --  106 104  --  103 106  CO2 21*  --   --  21* 22  --  20* 22  GLUCOSE 127* 127*  --  105* 106*  --  131* 135*  BUN 13 17  --  8 6*  --  8 <5*  CREATININE 1.21* 1.10*  --  0.87 0.92  --  0.84 0.81  CALCIUM 9.4  --   --  8.7* 8.7*  --  8.6* 8.8*  MG  --   --  1.9  --   --  1.6*  --  1.9  PHOS  --   --  3.0  --   --  2.9  --  2.7   GFR: Estimated Creatinine Clearance: 65.2 mL/min (by C-G formula based on SCr of 0.81 mg/dL). Liver Function Tests: Recent Labs  Lab 08/23/20 1330  AST 56*  ALT 18  ALKPHOS 73  BILITOT 1.0  PROT 7.5  ALBUMIN 3.7   No results for input(s): LIPASE, AMYLASE in the last 168 hours. Recent Labs  Lab 08/24/20 0330  AMMONIA 13   Coagulation Profile: Recent Labs  Lab 08/23/20 1330  INR 1.0   Cardiac Enzymes: Recent Labs  Lab 08/23/20 1958 08/26/20 0247 08/27/20 1102  CKTOTAL 1,002* 415* 534*   BNP (last 3 results) No results for input(s): PROBNP in the last 8760 hours. HbA1C: No results for input(s): HGBA1C in the last 72 hours. CBG: Recent Labs  Lab 08/23/20 1329 08/26/20 2025  GLUCAP 126* 97   Lipid Profile: No results for input(s): CHOL, HDL, LDLCALC, TRIG, CHOLHDL, LDLDIRECT in the last 72 hours. Thyroid Function Tests: No results for input(s): TSH, T4TOTAL, FREET4, T3FREE, THYROIDAB in the last 72 hours. Anemia Panel: No results for input(s): VITAMINB12, FOLATE, FERRITIN, TIBC, IRON, RETICCTPCT in the last 72 hours. Urine analysis:    Component Value Date/Time   COLORURINE YELLOW 08/27/2020 1620   APPEARANCEUR CLEAR 08/27/2020 1620   LABSPEC 1.012 08/27/2020 1620   PHURINE 7.0 08/27/2020 1620   GLUCOSEU NEGATIVE 08/27/2020 1620   HGBUR SMALL  (A) 08/27/2020 1620   BILIRUBINUR NEGATIVE 08/27/2020 1620   KETONESUR NEGATIVE 08/27/2020 1620   PROTEINUR NEGATIVE 08/27/2020 1620   NITRITE POSITIVE (A) 08/27/2020 1620   LEUKOCYTESUR NEGATIVE 08/27/2020 1620   Sepsis Labs: @LABRCNTIP (procalcitonin:4,lacticacidven:4)  ) Recent Results (from the past 240 hour(s))  Resp Panel by RT-PCR (Flu A&B, Covid) Nasopharyngeal Swab     Status: None   Collection Time: 08/23/20  5:09  PM   Specimen: Nasopharyngeal Swab; Nasopharyngeal(NP) swabs in vial transport medium  Result Value Ref Range Status   SARS Coronavirus 2 by RT PCR NEGATIVE NEGATIVE Final    Comment: (NOTE) SARS-CoV-2 target nucleic acids are NOT DETECTED.  The SARS-CoV-2 RNA is generally detectable in upper respiratory specimens during the acute phase of infection. The lowest concentration of SARS-CoV-2 viral copies this assay can detect is 138 copies/mL. A negative result does not preclude SARS-Cov-2 infection and should not be used as the sole basis for treatment or other patient management decisions. A negative result may occur with  improper specimen collection/handling, submission of specimen other than nasopharyngeal swab, presence of viral mutation(s) within the areas targeted by this assay, and inadequate number of viral copies(<138 copies/mL). A negative result must be combined with clinical observations, patient history, and epidemiological information. The expected result is Negative.  Fact Sheet for Patients:  BloggerCourse.com  Fact Sheet for Healthcare Providers:  SeriousBroker.it  This test is no t yet approved or cleared by the [place] FDA and  has been authorized for detection and/or diagnosis of SARS-CoV-2 by FDA under an Emergency Use Authorization (EUA). This EUA will remain  in effect (meaning this test can be used) for the duration of the COVID-19 declaration under Section 564(b)(1) of the Act,  21 U.S.C.section 360bbb-3(b)(1), unless the authorization is terminated  or revoked sooner.       Influenza A by PCR NEGATIVE NEGATIVE Final   Influenza B by PCR NEGATIVE NEGATIVE Final    Comment: (NOTE) The Xpert Xpress SARS-CoV-2/FLU/RSV plus assay is intended as an aid in the diagnosis of influenza from Nasopharyngeal swab specimens and should not be used as a sole basis for treatment. Nasal washings and aspirates are unacceptable for Xpert Xpress SARS-CoV-2/FLU/RSV testing.  Fact Sheet for Patients: BloggerCourse.com  Fact Sheet for Healthcare Providers: SeriousBroker.it  This test is not yet approved or cleared by the [place] FDA and has been authorized for detection and/or diagnosis of SARS-CoV-2 by FDA under an Emergency Use Authorization (EUA). This EUA will remain in effect (meaning this test can be used) for the duration of the COVID-19 declaration under Section 564(b)(1) of the Act, 21 U.S.C. section 360bbb-3(b)(1), unless the authorization is terminated or revoked.  Performed at [facility], [address], [place], [place] [address]   Culture, blood (Routine X 2) w Reflex to ID Panel     Status: None (Preliminary result)   Collection Time: 08/27/20 11:02 AM   Specimen: BLOOD  Result Value Ref Range Status   Specimen Description BLOOD BLOOD RIGHT HAND  Final   Special Requests   Final    BOTTLES DRAWN AEROBIC AND ANAEROBIC Blood Culture results may not be optimal due to an inadequate volume of blood received in culture bottles   Culture   Final    NO GROWTH 2 DAYS Performed at [facility], [address], [place], [place] [address]    Report Status PENDING  Incomplete  Culture, blood (Routine X 2) w Reflex to ID Panel     Status: None (Preliminary result)   Collection Time: 08/27/20 11:02 AM   Specimen: BLOOD  Result Value Ref Range Status   Specimen Description BLOOD LEFT ANTECUBITAL   Final   Special Requests   Final    BOTTLES DRAWN AEROBIC AND ANAEROBIC Blood Culture adequate volume   Culture   Final    NO GROWTH 2 DAYS Performed at [facility],  [address] [place], [place] [address]    Report Status PENDING  Incomplete         Radiology Studies: MR BRAIN WO CONTRAST  Result Date: 08/28/2020 CLINICAL DATA:  60 year old female code stroke presentation on 08/23/2020 with no acute intracranial abnormality by MRI at that time. Unexplained altered mental status. EXAM: MRI HEAD WITHOUT CONTRAST TECHNIQUE: Multiplanar, multiecho pulse sequences of the brain and surrounding structures were obtained without intravenous contrast. COMPARISON:  Brain MRI 08/23/2020 and earlier. FINDINGS: The examination had to be discontinued prior to completion due to patient agitation. No axial T1 or coronal T2 weighted imaging could be obtained. The remaining study is mildly degraded by motion. Brain: No restricted diffusion to suggest acute infarction. No midline shift, mass effect, evidence of mass lesion, ventriculomegaly, extra-axial collection or acute intracranial hemorrhage. Cervicomedullary junction and pituitary are within normal limits. Cerebral volume loss seems advanced for age, and perhaps disproportionately involving the brainstem and cerebellum. There is confluent bilateral cerebral white matter T2 and FLAIR hyperintensity in a nonspecific configuration. Temporal lobes are relatively spared. T2 heterogeneity in the bilateral deep gray matter nuclei and left corona radiata most resembles chronic lacunar infarcts. Comparatively mild T2 heterogeneity in the pons. Possible small chronic lacunar infarct in the left superior cerebellum on series 10, image 9. No chronic cerebral blood products are evident. No cortical encephalomalacia identified. Vascular: *INSERT* flow void are stable. Mild generalized intracranial artery tortuosity. Skull and upper cervical spine: Partially visible  cervical spine degeneration including mild anterolisthesis of C3 on C4. Visualized bone marrow signal is within normal limits. Sinuses/Orbits: Increased mild bilateral paranasal sinus mucosal thickening since last week. Orbits appear stable and negative. Other: Stable mild mastoid effusions. Grossly normal visible internal auditory structures. IMPRESSION: 1. No acute intracranial abnormality. The examination had to be discontinued prior to completion due to patient agitation. 2. Advanced but nonspecific cerebral white matter signal changes. Favor small vessel disease related given evidence of chronic lacunar infarcts in the left corona radiata, the bilateral deep gray matter nuclei, and probably also the left cerebellum. 3. Mild new bilateral paranasal sinus inflammation. Electronically Signed   By: Odessa Fleming M.D.   On: 08/28/2020 08:08        Scheduled Meds: . aspirin  325 mg Oral Daily  . enoxaparin (LOVENOX) injection  40 mg Subcutaneous Q24H  . feeding supplement  237 mL Oral TID with meals  . folic acid  1 mg Oral Daily  . multivitamin with minerals  1 tablet Oral Daily  . pantoprazole (PROTONIX) IV  40 mg Intravenous Q24H  . potassium chloride  40 mEq Oral BID  . thiamine  100 mg Oral Daily   Or  . thiamine  100 mg Intravenous Daily   Continuous Infusions: . sodium chloride Stopped (08/26/20 0439)  . cefTRIAXone (ROCEPHIN)  IV 1 g (08/29/20 0922)  . lacosamide (VIMPAT) IV 200 mg (08/29/20 1143)  . levETIRAcetam 1,500 mg (08/29/20 1022)     LOS: 6 days    Time spent: 35-minute    Alberteen Sam, MD [facility] 08/29/2020, 5:42 PM     Please page though AMION or Epic secure chat:  For [employer], Higher education careers adviser

## 2020-08-29 NOTE — Progress Notes (Signed)
vLTM EEG started. Very difficult set up. Pt was agitated at times and very matted hair making it difficult to hook up eeg leads. Tested event button

## 2020-08-29 NOTE — Progress Notes (Signed)
Occupational Therapy Treatment Patient Details Name: Cindy [name] MRN: [id number] DOB: 1961-01-09 Today's Date: 08/29/2020    History of present illness 60 yo female admitted with AMS .Pt with HTN and polysubstance abuse on arrival.  Current workup with L hemisphere seizure PMH HTN HLD CVA seizures Polysubstance   OT comments  Patient supine in bed with safety mittens on. Patient initially drowsy so treatment initiated at bed level. Patient able to wash her face well with setup, drink from a cup with a straw twice and feed herself half a cookie. Patient performing tasks with her left hand though reports she is right handed. Somewhat limited by IV/lines on RUE but throughout treatment exhibits decreased attention to the RUE, decreased ROM and decreased motor control during treatment. Patient required +2 assistance to transfer to side of bed and stand with RW. Patient still impulsive, improved communication though initially words garbled and hard to understand.  Patient demonstrated ability to follow at least half of commands today but needed verbal and tactile cues to right side. Patient perseverating on the whereabouts of her husband. Patient has significantly improved since evaluation in regards to alertness and participation. Due to patient's multiple impairments - right sided deficits, decreased cognition, impaired balance, strength and activity tolerance patient would benefit from aggressive short term rehab to return to baseline.     Follow Up Recommendations  CIR    Equipment Recommendations  Other (comment) (Defer to next venue)    Recommendations for Other Services      Precautions / Restrictions Precautions Precautions: Fall Precaution Comments: elbow block splint over IV to protect it in R UE, Restrictions Weight Bearing Restrictions: No       Mobility Bed Mobility Overal bed mobility: Needs Assistance Bed Mobility: Supine to Sit Rolling: Mod assist   Supine to sit: Mod  assist;+2 for physical assistance;+2 for safety/equipment Sit to supine: Mod assist;+2 for physical assistance;+2 for safety/equipment   General bed mobility comments: Needs verbal and tactile cues to assist with motor planning as well as physical assistance.    Transfers Overall transfer level: Needs assistance Equipment used: Rolling walker (2 wheeled) Transfers: Sit to/from [employer] Sit to Stand: Mod assist;+2 physical assistance;+2 safety/equipment         General transfer comment: Mod x 2 to stand from bed height with a right lateral lean. Does not use RUE effectively on walker. Needed physical assistance to advance RLE to side step x 2 to head of bed.    Balance Overall balance assessment: Needs assistance Sitting-balance support: Feet supported Sitting balance-Leahy Scale: Fair Sitting balance - Comments: very close min guard to sit edge of bed   Standing balance support: During functional activity Standing balance-Leahy Scale: Poor Standing balance comment: needed external support                           ADL either performed or assessed with clinical judgement   ADL Overall ADL's : Needs assistance/impaired Eating/Feeding: Set up;Bed level Eating/Feeding Details (indicate cue type and reason): Patient able to drink from a cup with a straw. Initially therapist controlled the bup but on second trial patient able to hold cup and bring straw to mouth. Patient able to eat half a cookie. Grooming: Therapist, nutritional;Bed level;Set up Grooming Details (indicate cue type and reason): Patient able to wash face with setup with fairly good quality.  Vision Baseline Vision/History: Wears glasses Wears Glasses: At all times Vision Assessment?: No apparent visual deficits   Perception     Praxis      Cognition Arousal/Alertness: Awake/alert (Initially drowsy but became alert with  stimulation.) Behavior During Therapy: Restless;Impulsive Overall Cognitive Status: Impaired/Different from baseline Area of Impairment: Awareness;Safety/judgement;Following commands;Memory;Attention;Orientation                 Orientation Level: Person;Place Current Attention Level: Sustained Memory: Decreased short-term memory Following Commands: Follows one step commands inconsistently Safety/Judgement: Decreased awareness of deficits;Decreased awareness of safety Awareness: Emergent            Exercises     Shoulder Instructions       General Comments      Pertinent Vitals/ Pain       Pain Assessment: No/denies pain Faces Pain Scale: No hurt  Home Living                                          Prior Functioning/Environment              Frequency  Min 2X/week        Progress Toward Goals  OT Goals(current goals can now be found in the care plan section)  Progress towards OT goals: Progressing toward goals  Acute Rehab OT Goals Patient Stated Goal: none stated OT Goal Formulation: Patient unable to participate in goal setting Time For Goal Achievement: 09/09/20 Potential to Achieve Goals: Good  Plan      Co-evaluation    PT/OT/SLP Co-Evaluation/Treatment: Yes Reason for Co-Treatment: For patient/therapist safety;To address functional/ADL transfers PT goals addressed during session: Mobility/safety with mobility OT goals addressed during session: ADL's and self-care      AM-PAC OT "6 Clicks" Daily Activity     Outcome Measure   Help from another person eating meals?: A Little Help from another person taking care of personal grooming?: A Little Help from another person toileting, which includes using toliet, bedpan, or urinal?: Total Help from another person bathing (including washing, rinsing, drying)?: A Lot Help from another person to put on and taking off regular upper body clothing?: A Lot   6 Click Score:  11    End of Session Equipment Utilized During Treatment: Rolling walker  OT Visit Diagnosis: Unsteadiness on feet (R26.81);Muscle weakness (generalized) (M62.81);Other symptoms and signs involving the nervous system (R29.898);Other symptoms and signs involving cognitive function   Activity Tolerance Patient tolerated treatment well   Patient Left in bed;with call bell/phone within reach;with bed alarm set   Nurse Communication Mobility status        Time: 1341-1410 OT Time Calculation (min): 29 min  Charges: OT General Charges $OT Visit: 1 Visit OT Treatments $Self Care/Home Management : 8-22 mins  Waldron Session, OTR/L Acute Care Rehab Services  Office [phone] Pager: [phone]    Kelli Churn 08/29/2020, 2:31 PM

## 2020-08-29 NOTE — Plan of Care (Signed)

## 2020-08-29 NOTE — Progress Notes (Signed)
Subjective: She is significantly improved today, nurse reports significant waxing/waning.  Exam: Vitals:   08/29/20 0754 08/29/20 1134  BP: (!) 128/107 138/90  Pulse: 100 90  Resp: (!) 24 13  Temp: 98.1 F (36.7 C) 98.7 F (37.1 C)  SpO2: 99% 99%   Gen: In bed, NAD Resp: non-labored breathing, no acute distress Abd: soft, nt  Neuro: MS: Awake, tells me her name, she is not oriented but she does follow commands DI:YMEBRAX and tracks, pupils are reactive bilaterally Motor: She is able to hold both arms against gravity, though she does drift on the right more than the left.  Able to lift both legs as well.  Pertinent Labs: Ammonia 32 TSH 1.95  Impression: 60 year old female who is status epilepticus with persistent deficits after cessation of status epilepticus. I would expect her exam to continue to gradually improve, possibly incompletely.  Her exam today is actually very reassuring.  The report of continued waxing/waning and the degree of waxing/waning that we are seeing does make me want to rule out ongoing seizures.  I noted that she has had prolonged EEG previously, but with her current improvement in exam, we may get more information if we see her continue to wax/wane.  Recommendations: 1) continue Vimpat 200 twice daily 2) continue Keppra 1500 twice daily 3) repeat overnight EEG 4) neurology will follow   Ritta Slot, MD [facility] [phone]  If 7pm- 7am, please page neurology on call as listed in AMION.

## 2020-08-29 NOTE — Progress Notes (Signed)
  Speech Language Pathology Treatment: Dysphagia  Patient Details Name: Cindy [name] MRN: [id number] DOB: 1960/09/01 Today's Date: 08/29/2020 Time: 0932-3557 SLP Time Calculation (min) (ACUTE ONLY): 27 min  Assessment / Plan / Recommendation Clinical Impression  Skilled SLP to determine po tolerance, readiness for dietary advancement and effectiveness of compensation strategies.  Pt helped to self feed today with her right hand *SLP removed glove for po*.  She was observed with approx 20 trials of po including graham cracker, ice cream and gingerale.  She readily opened her mouth to accept intake - needed max assist to help bring cup/straw fully to mouth and to swallow with tsp thin and ice cream boluses.  Pt responses to moderate verbal cues to swallow - which was effective for airway protection.  Immediate cough x1 of 12 THIN boluses due to pt self feeding without adequate modulation. Throat clear x1 with icecream - suspect secretion retention.    Pt with much improved mentation today - thus improvement in po tolerance and swallowing.   Advise to continue to focus on finger foods for pt -soft sandwiches, etc - items pt can help self feed to improve swallow safety.   Pt will require oral suctioning or cues to expectorate if she demonstrates excessive oral holding. Today she remains with aphasia but was able to say "choke" when asked why needed to swallow - demonstrating emergent comprehension.  Feeder will have to observe pt swallow- ie laryngeal elevation before giving pt more po for swallow safety.  Will follow up briefly.    HPI HPI: Pt presents with acute metabolic encephalopathy secondary to suspected seizure with possible status epilepticus: Patient found to be acutely altered with intermittent shaking, urinary incontinence, and left gaze preference at home.  CTA of the head and neck negative for any large vessel occlusion.  MRI did not show any acute signs of a infarct, chronic infarct  consistent with 2021 finding. In 2021 pt fully verbal, primarily working on cognitive goals. progresed from dys1/nectar to mech soft thin that admission.  Patient has been placed on continuous EEG monitoring, but no initial seizure activity appreciated.  Pt underwent BSE and was placed on a finger food/soft/thin diet.  Evaluation was reordered today - presumed due to AMS.  Pt with decreased mentation and increased agitation last evening 4/12 pm - was advised to have clears via tsp only.  Follow up today indicated to determine readiness for advancement/po tolerance.  Marland Kitchen      SLP Plan  Continue with current plan of care       Recommendations  Diet recommendations: Dysphagia 3 (mechanical soft);Thin liquid Liquids provided via: Cup;Straw Medication Administration: Whole meds with puree Supervision: Full supervision/cueing for compensatory strategies Compensations: Minimize environmental distractions;Other (Comment);Slow rate;Small sips/bites (hand over hand assist for self feeding, assure swallows or cue to swallow or spit) Postural Changes and/or Swallow Maneuvers: Seated upright 90 degrees;Upright 30-60 min after meal                Oral Care Recommendations: Oral care BID Follow up Recommendations: Skilled Nursing facility SLP Visit Diagnosis: Dysphagia, oral phase (R13.11) Plan: Continue with current plan of care       GO                Chales Abrahams 08/29/2020, 11:01 AM   Rolena Infante, MS [facility] SLP Acute Rehab Services Office [phone] Pager [phone]

## 2020-08-30 LAB — CBC
HCT: 32.8 % — ABNORMAL LOW (ref 36.0–46.0)
Hemoglobin: 11.2 g/dL — ABNORMAL LOW (ref 12.0–15.0)
MCH: 31.2 pg (ref 26.0–34.0)
MCHC: 34.1 g/dL (ref 30.0–36.0)
MCV: 91.4 fL (ref 80.0–100.0)
Platelets: 381 10*3/uL (ref 150–400)
RBC: 3.59 MIL/uL — ABNORMAL LOW (ref 3.87–5.11)
RDW: 11.5 % (ref 11.5–15.5)
WBC: 4.5 10*3/uL (ref 4.0–10.5)
nRBC: 0 % (ref 0.0–0.2)

## 2020-08-30 LAB — COMPREHENSIVE METABOLIC PANEL
ALT: 13 U/L (ref 0–44)
AST: 21 U/L (ref 15–41)
Albumin: 2.7 g/dL — ABNORMAL LOW (ref 3.5–5.0)
Alkaline Phosphatase: 51 U/L (ref 38–126)
Anion gap: 7 (ref 5–15)
BUN: <5 mg/dL — ABNORMAL LOW (ref 8–23)
CO2: 23 mmol/L (ref 22–32)
Calcium: 9.2 mg/dL (ref 8.9–10.3)
Chloride: 105 mmol/L (ref 98–111)
Creatinine, Ser: 0.94 mg/dL (ref 0.44–1.00)
GFR, Estimated: >60 mL/min (ref >60)
Glucose, Bld: 105 mg/dL — ABNORMAL HIGH (ref 70–99)
Potassium: 3.5 mmol/L (ref 3.5–5.1)
Sodium: 135 mmol/L (ref 135–145)
Total Bilirubin: 0.5 mg/dL (ref 0.3–1.2)
Total Protein: 6.5 g/dL (ref 6.5–8.1)

## 2020-08-30 LAB — URINE CULTURE: Culture: 40000 — AB

## 2020-08-30 MED ORDER — ATORVASTATIN CALCIUM 40 MG PO TABS: 40.0000 mg | ORAL_TABLET | Freq: Every day | ORAL | Status: DC

## 2020-08-30 MED ORDER — LACOSAMIDE 200 MG PO TABS
200.0000 mg | ORAL_TABLET | Freq: Two times a day (BID) | ORAL | Status: DC
  Administered 2020-08-30 – 2020-08-31 (×2): 200 mg via ORAL
  Filled 2020-08-30 (×2): qty 1

## 2020-08-30 MED ORDER — QUETIAPINE FUMARATE 50 MG PO TABS
50.0000 mg | ORAL_TABLET | Freq: Two times a day (BID) | ORAL | Status: DC | PRN
  Administered 2020-08-31: 50 mg via ORAL
  Filled 2020-08-30: qty 1

## 2020-08-30 MED ORDER — AMLODIPINE BESYLATE 5 MG PO TABS
5.0000 mg | ORAL_TABLET | Freq: Every day | ORAL | Status: DC
  Administered 2020-08-30 – 2020-08-31 (×2): 5 mg via ORAL
  Filled 2020-08-30: qty 2
  Filled 2020-08-30: qty 1

## 2020-08-30 MED ORDER — CEPHALEXIN 500 MG PO CAPS
500.0000 mg | ORAL_CAPSULE | Freq: Three times a day (TID) | ORAL | Status: DC
  Administered 2020-08-31 (×2): 500 mg via ORAL
  Filled 2020-08-30 (×2): qty 1

## 2020-08-30 MED ORDER — PANTOPRAZOLE SODIUM 40 MG PO TBEC
40.0000 mg | DELAYED_RELEASE_TABLET | Freq: Every day | ORAL | Status: DC
  Administered 2020-08-30 – 2020-08-31 (×2): 40 mg via ORAL
  Filled 2020-08-30 (×2): qty 1

## 2020-08-30 MED ORDER — METOPROLOL SUCCINATE ER 50 MG PO TB24
50.0000 mg | ORAL_TABLET | Freq: Every day | ORAL | Status: DC
  Administered 2020-08-30 – 2020-08-31 (×2): 50 mg via ORAL
  Filled 2020-08-30 (×2): qty 1

## 2020-08-30 MED ORDER — VALPROIC ACID 250 MG/5ML PO SOLN
1000.0000 mg | Freq: Two times a day (BID) | ORAL | Status: DC
  Administered 2020-08-30 – 2020-08-31 (×2): 1000 mg via ORAL
  Filled 2020-08-30 (×3): qty 20

## 2020-08-30 MED ORDER — QUETIAPINE FUMARATE 50 MG PO TABS
50.0000 mg | ORAL_TABLET | Freq: Every day | ORAL | Status: DC
  Administered 2020-08-30: 50 mg via ORAL
  Filled 2020-08-30: qty 1

## 2020-08-30 NOTE — Procedures (Addendum)
Patient Name:Cindy [name] TDV:[id number] Epilepsy Attending:Keiland Pickering [name] Referring Physician/Provider:Dr Ritta Slot Duration:4/13/20221531 to 4/14/20221531  Patient history:59 yo F w possible seizure activity with left gaze deviation and shaking.EEG to evaluate for seizure.  Level of alertness:Awake, asleep  AEDs during EEG study:LEV, LCM  Technical aspects: This EEG study was done with scalp electrodes positioned according to the 10-20 International system of electrode placement. Electrical activity was acquired at a sampling rate of 500Hz  and reviewed with a high frequency filter of 70Hz  and a low frequency filter of 1Hz . EEG data were recorded continuously and digitally stored.   Description: The posterior dominant rhythm consists of8Hz  activity of moderate voltage (25-35 uV) seen predominantly in posterior head regions, symmetricand reactive to eye opening and eye closing. Sleep was characterized by vertex waves, sleep spindles (12 to 14 Hz), maximal frontocentral region. EEG showed continuousgeneralized 3 to 6 Hz theta-delta slowing.Hyperventilation and photic stimulation were not performed.   ABNORMALITY - Continuous slow, generalized  IMPRESSION: This study is suggestive of mild to moderate diffuse encephalopathy, nonspecific etiology.No seizures ordefiniteepileptiform discharges were seen throughout the recording.   [name] 

## 2020-08-30 NOTE — Plan of Care (Signed)

## 2020-08-30 NOTE — Progress Notes (Signed)
Subjective: Received IV Ativan for agitation overnight.  Per RN, patient does okay during day but gets agitated during evening after around 4 PM.  Sometimes, if the team can figure out why she is upset (for example if her clothes are wet) she will come doubtful some time but then get agitated again.  ROS: Unable to obtain due to poor mental status  Examination  Vital signs in last 24 hours: Temp:  [97.4 F (36.3 C)-98.7 F (37.1 C)] 97.6 F (36.4 C) (04/14 0756) Pulse Rate:  [88-104] 88 (04/14 0730) Resp:  [13-20] 20 (04/14 0730) BP: (138-180)/(83-100) 152/100 (04/14 0730) SpO2:  [96 %-100 %] 100 % (04/14 0730)  General: lying in bed, not in apparent distress CVS: pulse-normal rate and rhythm RS: breathing comfortably Extremities: normal   Neuro: MS: Awake, alert, oriented to self, place, thought it was March.  Able to name objects, able to follow simple one-step commands, able to tell me 5+2= 7 CN: 2-12 grossly intact  Motor: Antigravity strength in all 4 extremities but right is weaker than left  Basic Metabolic Panel: Recent Labs  Lab 08/24/20 0330 08/25/20 0931 08/26/20 0247 08/27/20 1102 08/27/20 1426 08/28/20 0444 08/30/20 0245  NA  --  135 135  --  132* 134* 135  K  --  3.1* 3.1*  --  2.7* 3.9 3.5  CL  --  106 104  --  103 106 105  CO2  --  21* 22  --  20* 22 23  GLUCOSE  --  105* 106*  --  131* 135* 105*  BUN  --  8 6*  --  8 <5* <5*  CREATININE  --  0.87 0.92  --  0.84 0.81 0.94  CALCIUM  --  8.7* 8.7*  --  8.6* 8.8* 9.2  MG 1.9  --   --  1.6*  --  1.9  --   PHOS 3.0  --   --  2.9  --  2.7  --     CBC: Recent Labs  Lab 08/23/20 1330 08/23/20 1335 08/25/20 0931 08/27/20 1102 08/28/20 0444 08/30/20 0245  WBC 11.8*  --  9.2 11.3* 9.0 4.5  NEUTROABS 9.2*  --  7.2 8.9*  --   --   HGB 13.7 14.3 13.2 12.6 11.5* 11.2*  HCT 40.6 42.0 38.9 36.5 33.1* 32.8*  MCV 94.6  --  93.7 89.9 91.2 91.4  PLT 382  --  304 312 316 381     Coagulation Studies: No  results for input(s): LABPROT, INR in the last 72 hours.  Imaging MRI brain without contrast 08/28/2020:  1. No acute intracranial abnormality. The examination had to be discontinued prior to completion due to patient agitation. 2. Advanced but nonspecific cerebral white matter signal changes. Favor small vessel disease related given evidence of chronic lacunar infarcts in the left corona radiata, the bilateral deep gray matter nuclei, and probably also the left cerebellum. 3. Mild new bilateral paranasal sinus inflammation.  ASSESSMENT AND PLAN: 60 year old female who initially presented with status epilepticus in the setting of cocaine use but has continued to have right-sided weakness which is gradually improved but patient continues to have waxing and waning mental status.  Acute encephalopathy Seizure Right hemiparesis (improving) - LTM EEG: No seizures overnight   -Waxing and waning encephalopathy is likely multifactorial due to sundowning, iatrogenic secondary to medication use overnight (patient has been given Haldol, Ativan), substance use disorder, prolonged postictal state  Recommendations - Continue Vimpat 200 mg  twice daily (will switch to p.o.) -We will switch Keppra to Depakote to help with mood stabilization - Discussed with Dr. Maryfrances Bunnell to start her on Seroquel for sundowning/agitation overnight instead of using as needed Ativan and Haldol - Continue seizure precautions - IV ativan 2mg  for clinical seizures lasting over 5 mins   I have spent a total of 35  minutes with the patient reviewing hospital notes,  test results, labs and examining the patient as well as establishing an assessment and plan that was discussed personally with Dr. .  > 50% of time was spent in direct patient care.   Maryfrances Bunnell Epilepsy [facility] For questions after 5pm please refer to AMION to reach the Neurologist on call

## 2020-08-30 NOTE — Progress Notes (Signed)
Physical Therapy Treatment Patient Details Name: Cindy [name] MRN: [id number] DOB: July 23, 1960 Today's Date: 08/30/2020    History of Present Illness 60 yo female admitted with AMS .Pt with HTN and polysubstance abuse on arrival.  Current workup with L hemisphere seizure PMH HTN HLD CVA seizures Polysubstance    PT Comments    Session limited by lethargy, per RN patient received Haldol 30 minutes prior to arrival. Patient required maxA+2 to complete bed mobility with patient able to initiate trunk elevation. Required min guard for sitting EOB but demonstrate severe trunk and neck flexion and required maxA to maintain upright posture. Continue to recommend comprehensive inpatient rehab (CIR) for post-acute therapy needs.     Follow Up Recommendations  CIR     Equipment Recommendations  Other (comment) (defer to post acute rehab)    Recommendations for Other Services       Precautions / Restrictions Precautions Precautions: Fall Precaution Comments: Mittens Restrictions Weight Bearing Restrictions: No    Mobility  Bed Mobility Overal bed mobility: Needs Assistance Bed Mobility: Rolling;Supine to Sit;Sit to Supine Rolling: Max assist   Supine to sit: Max assist;+2 for safety/equipment;+2 for physical assistance Sit to supine: Max assist;+2 for physical assistance;+2 for safety/equipment   General bed mobility comments: Patient able to initiate trunk elevation but overall maxA+2 to complete bed mobility. Upon sitting EOB, able to maintain sitting balance with min guard, however severe trunk and neck flexion and inability to keep upright posture without maxA    Transfers                 General transfer comment: deferred due to lethargy  Ambulation/Gait                 Stairs             Wheelchair Mobility    Modified Rankin (Stroke Patients Only) Modified Rankin (Stroke Patients Only) Pre-Morbid Rankin Score: Slight disability Modified Rankin:  Severe disability     Balance Overall balance assessment: Needs assistance Sitting-balance support: Feet supported Sitting balance-Leahy Scale: Fair Sitting balance - Comments: close min guard to sit edge of bed                                    Cognition Arousal/Alertness: Lethargic;Suspect due to medications (awake at end of session) Behavior During Therapy: Restless;Impulsive Overall Cognitive Status: Difficult to assess                                 General Comments: No verbalizations this session. Difficult to assess cognition due to impaired communication and lethargy      Exercises      General Comments        Pertinent Vitals/Pain Pain Assessment: Faces Faces Pain Scale: No hurt Pain Intervention(s): Monitored during session    Home Living                      Prior Function            PT Goals (current goals can now be found in the care plan section) Acute Rehab PT Goals Patient Stated Goal: none stated PT Goal Formulation: With patient Time For Goal Achievement: 09/09/20 Potential to Achieve Goals: Fair Progress towards PT goals: Not progressing toward goals - comment (limited by lethargy)    Frequency  Min 4X/week      PT Plan Current plan remains appropriate    Co-evaluation              AM-PAC PT "6 Clicks" Mobility   Outcome Measure  Help needed turning from your back to your side while in a flat bed without using bedrails?: A Lot Help needed moving from lying on your back to sitting on the side of a flat bed without using bedrails?: A Lot Help needed moving to and from a bed to a chair (including a wheelchair)?: A Lot Help needed standing up from a chair using your arms (e.g., wheelchair or bedside chair)?: A Lot Help needed to walk in hospital room?: Total Help needed climbing 3-5 steps with a railing? : Total 6 Click Score: 10    End of Session   Activity Tolerance: Patient limited  by lethargy Patient left: in bed;with call bell/phone within reach;with bed alarm set;with restraints reapplied Nurse Communication: Mobility status PT Visit Diagnosis: Muscle weakness (generalized) (M62.81);Unsteadiness on feet (R26.81);History of falling (Z91.81);Difficulty in walking, not elsewhere classified (R26.2);Other symptoms and signs involving the nervous system (B01.751)     Time: 0258-5277 PT Time Calculation (min) (ACUTE ONLY): 16 min  Charges:  $Therapeutic Activity: 8-22 mins                     Gwynn Crossley A. Dan Humphreys PT, DPT Acute Rehabilitation Services Pager [phone] Office [phone]    Viviann Spare 08/30/2020, 4:58 PM

## 2020-08-30 NOTE — Progress Notes (Signed)
LTM maint complete 

## 2020-08-30 NOTE — Progress Notes (Signed)
IP rehab admissions - awaiting medical readiness prior to acute inpatient rehab admission.  I spoke with patient's brother today and updated him.  Will follow up tomorrow to see if patient is ready for CIR.  Call me for questions.

## 2020-08-30 NOTE — Plan of Care (Signed)

## 2020-08-30 NOTE — Progress Notes (Signed)
[facility] PROGRESS NOTE    Cindy [name]  NUU:[id number] DOB: 04-01-1961 DOA: 08/23/2020 PCP: Avon Gully, MD      Brief Narrative:  Cindy [name] is a 60 y.o. F with HTN, seizures, history of status epilepticus, history of polysubstance abuse of cocaine and alcohol, and cerebrovascular disease who presented with altered mental status.  Evidently the patient began having seizures the night of admission.  This recurred off and on for several hours until EMS were activated.  In the ER, the aptietn was post-ictal and confused.  CT head unremarkable.  She was loaded with Keppra and Vimpat and admitted for continuous EEG>     4/8-4/11: on LTM EEG with cortical dysfunction in left hemisphere, no frank seizures but mental status remaining somnolent and confused        Assessment & Plan:  Status epilepticus Patient was admitted and loaded with Vimpat and Keppra.  Placed on LTM EEG that showed persistent left-sided cortical dysfunction without frank seizures.    Her mentation has started to improve somewhat.  Likely her current decreased mental status represents cerebral insult due to prolonged status, hopefully this will improve, unclear if it will completely improve. - Consult neurology, appreciate cares - Continue Vimpat and Keppra     Delirium and acute metabolic encephalopathy due to status epilepticus At baseline since her admission for seizures in early 2021, the patient has been oriented to self, and independent for self cares, although very weak, and using a cane and somewhat unsteady on her feet for the last year and a half, but mentally alert.  Delirium precautions:   -Lights and TV off, minimize interruptions at night  -Blinds open and lights on during day  -Glasses/hearing aid with patient  -Frequent reorientation  -PT/OT when able  -Avoid sedation medications/Beers list medications   -Stop benzodiazepines -Start scheduled Seroquel at night -PRN  Seroquel for agitation   Hypertension Cerebrovascular disease BP elevated -Continue aspirin and resume atorvastatin -Resume home amlodipine and metoprolol  Severe protein calorie malnutrition As evidenced by BMI 19, loss of 8% body weight since last year when her weight was 61 kg, as well as severe decreased muscle mass and fat, encephalopathy leading up less than 50% oral intake  UTI Fever on 4/12, urine culture today with multiple species - Repeat urine culture - Transition Rocephin to Keflex for total 5 days   Hypomagnesemia Hypomagnesemia Resolved with treatment  History of polysubstance abuse At this point outside the window for withdrawal, although I suspect this is part of the reason she has persistent encephalopathy due to her decreased neurological reserve -Continue thiamine and folate           Disposition: Status is: Inpatient  Remains inpatient appropriate because:Altered mental status   Dispo: The patient is from: Home              Anticipated d/c is to: SNF              Patient currently is not medically stable to d/c.   Difficult to place patient Yes       Level of care: Med-Surg       MDM: The below labs and imaging reports were reviewed and summarized above.  Medication management as above.  The patient has persistent neurological change.    DVT prophylaxis: enoxaparin (LOVENOX) injection 40 mg Start: 08/23/20 1715  Code Status: DNR Family Communication: Attempted call to husband Cindy [name], no answer, no voicemail set up.  This appears  to be a consistent pattern.        Subjective: No fever, no respiratory distress, no obvious pain per nursing.  Patient has no cough or vomiting.  She remains mostly encephalopathic most of the time.     Objective: Vitals:   08/30/20 0437 08/30/20 0730 08/30/20 0756 08/30/20 1100  BP: (!) 165/84 (!) 152/100  (!) 156/96  Pulse: (!) 102 88  90  Resp: 18 20  20   Temp: 97.6 F (36.4 C) (!) 97.4  F (36.3 C) 97.6 F (36.4 C) 97.9 F (36.6 C)  TempSrc: Oral Oral Axillary Axillary  SpO2: 96% 100%  99%  Weight:      Height:        Intake/Output Summary (Last 24 hours) at 08/30/2020 1813 Last data filed at 08/30/2020 1130 Gross per 24 hour  Intake 180 ml  Output 1340 ml  Net -1160 ml   Filed Weights   08/23/20 1300  Weight: 56.6 kg    Examination: General appearance: Thin elderly female, lying in bed, somnolent, unresponsive to stimuli     HEENT: Anicteric, conjunctival pink, lids and lashes normal.  No nasal deformity, discharge, or epistaxis. Skin: Skin warm and dry without jaundice Cardiac: Heart rate normal, regular, no murmurs appreciated, no lower extremity edema Respiratory: Normal respiratory effort, no rales or wheezes Abdomen: Abdomen soft, no grimace to palpation no ascites MSK: Diffuse loss of subcutaneous muscle mass and fat. Neuro: Awake, but sleepy, falls asleep and does not respond to commands, does not follow commands consistently.  No spontaneous verbalizations today Psych: Sleepy, confused, judgment and insight impaired        Data Reviewed: I have personally reviewed following labs and imaging studies:  CBC: Recent Labs  Lab 08/25/20 0931 08/27/20 1102 08/28/20 0444 08/30/20 0245  WBC 9.2 11.3* 9.0 4.5  NEUTROABS 7.2 8.9*  --   --   HGB 13.2 12.6 11.5* 11.2*  HCT 38.9 36.5 33.1* 32.8*  MCV 93.7 89.9 91.2 91.4  PLT 304 312 316 381   Basic Metabolic Panel: Recent Labs  Lab 08/24/20 0330 08/25/20 0931 08/26/20 0247 08/27/20 1102 08/27/20 1426 08/28/20 0444 08/30/20 0245  NA  --  135 135  --  132* 134* 135  K  --  3.1* 3.1*  --  2.7* 3.9 3.5  CL  --  106 104  --  103 106 105  CO2  --  21* 22  --  20* 22 23  GLUCOSE  --  105* 106*  --  131* 135* 105*  BUN  --  8 6*  --  8 <5* <5*  CREATININE  --  0.87 0.92  --  0.84 0.81 0.94  CALCIUM  --  8.7* 8.7*  --  8.6* 8.8* 9.2  MG 1.9  --   --  1.6*  --  1.9  --   PHOS 3.0  --   --   2.9  --  2.7  --    GFR: Estimated Creatinine Clearance: 56.2 mL/min (by C-G formula based on SCr of 0.94 mg/dL). Liver Function Tests: Recent Labs  Lab 08/30/20 0245  AST 21  ALT 13  ALKPHOS 51  BILITOT 0.5  PROT 6.5  ALBUMIN 2.7*   No results for input(s): LIPASE, AMYLASE in the last 168 hours. Recent Labs  Lab 08/24/20 0330  AMMONIA 13   Coagulation Profile: No results for input(s): INR, PROTIME in the last 168 hours. Cardiac Enzymes: Recent Labs  Lab 08/23/20 1958 08/26/20 0247  08/27/20 1102  CKTOTAL 1,002* 415* 534*   BNP (last 3 results) No results for input(s): PROBNP in the last 8760 hours. HbA1C: No results for input(s): HGBA1C in the last 72 hours. CBG: Recent Labs  Lab 08/26/20 2025  GLUCAP 97   Lipid Profile: No results for input(s): CHOL, HDL, LDLCALC, TRIG, CHOLHDL, LDLDIRECT in the last 72 hours. Thyroid Function Tests: No results for input(s): TSH, T4TOTAL, FREET4, T3FREE, THYROIDAB in the last 72 hours. Anemia Panel: No results for input(s): VITAMINB12, FOLATE, FERRITIN, TIBC, IRON, RETICCTPCT in the last 72 hours. Urine analysis:    Component Value Date/Time   COLORURINE YELLOW 08/27/2020 1620   APPEARANCEUR CLEAR 08/27/2020 1620   LABSPEC 1.012 08/27/2020 1620   PHURINE 7.0 08/27/2020 1620   GLUCOSEU NEGATIVE 08/27/2020 1620   HGBUR SMALL (A) 08/27/2020 1620   BILIRUBINUR NEGATIVE 08/27/2020 1620   KETONESUR NEGATIVE 08/27/2020 1620   PROTEINUR NEGATIVE 08/27/2020 1620   NITRITE POSITIVE (A) 08/27/2020 1620   LEUKOCYTESUR NEGATIVE 08/27/2020 1620   Sepsis Labs: (procalcitonin:4,lacticacidven:4)  ) Recent Results (from the past 240 hour(s))  Resp Panel by RT-PCR (Flu A&B, Covid) Nasopharyngeal Swab     Status: None   Collection Time: 08/23/20  5:09 PM   Specimen: Nasopharyngeal Swab; Nasopharyngeal(NP) swabs in vial transport medium  Result Value Ref Range Status   SARS Coronavirus 2 by RT PCR NEGATIVE NEGATIVE Final     Comment: (NOTE) SARS-CoV-2 target nucleic acids are NOT DETECTED.  The SARS-CoV-2 RNA is generally detectable in upper respiratory specimens during the acute phase of infection. The lowest concentration of SARS-CoV-2 viral copies this assay can detect is 138 copies/mL. A negative result does not preclude SARS-Cov-2 infection and should not be used as the sole basis for treatment or other patient management decisions. A negative result may occur with  improper specimen collection/handling, submission of specimen other than nasopharyngeal swab, presence of viral mutation(s) within the areas targeted by this assay, and inadequate number of viral copies(<138 copies/mL). A negative result must be combined with clinical observations, patient history, and epidemiological information. The expected result is Negative.  Fact Sheet for Patients:  BloggerCourse.com  Fact Sheet for Healthcare Providers:  SeriousBroker.it  This test is no t yet approved or cleared by the [place] FDA and  has been authorized for detection and/or diagnosis of SARS-CoV-2 by FDA under an Emergency Use Authorization (EUA). This EUA will remain  in effect (meaning this test can be used) for the duration of the COVID-19 declaration under Section 564(b)(1) of the Act, 21 U.S.C.section 360bbb-3(b)(1), unless the authorization is terminated  or revoked sooner.       Influenza A by PCR NEGATIVE NEGATIVE Final   Influenza B by PCR NEGATIVE NEGATIVE Final    Comment: (NOTE) The Xpert Xpress SARS-CoV-2/FLU/RSV plus assay is intended as an aid in the diagnosis of influenza from Nasopharyngeal swab specimens and should not be used as a sole basis for treatment. Nasal washings and aspirates are unacceptable for Xpert Xpress SARS-CoV-2/FLU/RSV testing.  Fact Sheet for Patients: BloggerCourse.com  Fact Sheet for Healthcare  Providers: SeriousBroker.it  This test is not yet approved or cleared by the [place] FDA and has been authorized for detection and/or diagnosis of SARS-CoV-2 by FDA under an Emergency Use Authorization (EUA). This EUA will remain in effect (meaning this test can be used) for the duration of the COVID-19 declaration under Section 564(b)(1) of the Act, 21 U.S.C. section 360bbb-3(b)(1), unless the authorization is terminated or revoked.  Performed at [facility], [address] [place], [place] [address]   Culture, blood (Routine X 2) w Reflex to ID Panel     Status: None (Preliminary result)   Collection Time: 08/27/20 11:02 AM   Specimen: BLOOD  Result Value Ref Range Status   Specimen Description BLOOD BLOOD RIGHT HAND  Final   Special Requests   Final    BOTTLES DRAWN AEROBIC AND ANAEROBIC Blood Culture results may not be optimal due to an inadequate volume of blood received in culture bottles   Culture   Final    NO GROWTH 3 DAYS Performed at [facility], [address] [place], [place] [address]    Report Status PENDING  Incomplete  Culture, blood (Routine X 2) w Reflex to ID Panel     Status: None (Preliminary result)   Collection Time: 08/27/20 11:02 AM   Specimen: BLOOD  Result Value Ref Range Status   Specimen Description BLOOD LEFT ANTECUBITAL  Final   Special Requests   Final    BOTTLES DRAWN AEROBIC AND ANAEROBIC Blood Culture adequate volume   Culture   Final    NO GROWTH 3 DAYS Performed at [facility], [address] [place], [place] [address]    Report Status PENDING  Incomplete  Culture, Urine     Status: Abnormal   Collection Time: 08/28/20  9:00 PM   Specimen: Urine, Random  Result Value Ref Range Status   Specimen Description URINE, RANDOM  Final   Special Requests   Final    NONE Performed at [facility], [address] [place], [place] [address]    Culture (A)  Final    40,000  COLONIES/mL MULTIPLE SPECIES PRESENT, SUGGEST RECOLLECTION   Report Status 08/30/2020 FINAL  Final         Radiology Studies: Overnight EEG with video  Result Date: 08/30/2020 Charlsie QuestYadav, Priyanka O, MD     08/30/2020  8:59 AM Patient Name:Cindy Darrin LuisS [name] MVH:[id number] Epilepsy Attending:Priyanka [name] Yadav Referring Physician/Provider:Dr Ritta SlotMcNeill Kirkpatrick Duration:4/13/20221531 to 4/14/20220900  Patient history:59 yo F w possible seizure activity with left gaze deviation and shaking.EEG to evaluate for seizure.  Level of alertness:Awake, asleep  AEDs during EEG study:LEV, LCM  Technical aspects: This EEG study was done with scalp electrodes positioned according to the 10-20 International system of electrode placement. Electrical activity was acquired at a sampling rate of 500Hz  and reviewed with a high frequency filter of 70Hz  and a low frequency filter of 1Hz . EEG data were recorded continuously and digitally stored.  Description: The posterior dominant rhythm consists of8Hz  activity of moderate voltage (25-35 uV) seen predominantly in posterior head regions, symmetricand reactive to eye opening and eye closing. Sleep was characterized by vertex waves, sleep spindles (12 to 14 Hz), maximal frontocentral region. EEG showed intermittentgeneralized 3 to 6 Hz theta-delta slowing.Hyperventilation and photic stimulation were not performed.   ABNORMALITY -Intermittentslow, generalized IMPRESSION: This study is suggestive of mild to moderate diffuse encephalopathy, nonspecific etiology.No seizures ordefiniteepileptiform discharges were seen throughout the recording.   Priyanka [name] Yadav       Scheduled Meds: . aspirin  325 mg Oral Daily  . enoxaparin (LOVENOX) injection  40 mg Subcutaneous Q24H  . feeding supplement  237 mL Oral TID with meals  . folic acid  1 mg Oral Daily  . lacosamide  200 mg Oral BID  . multivitamin with minerals  1 tablet Oral Daily  . pantoprazole  40  mg  Oral Daily  . potassium chloride  40 mEq Oral BID  . QUEtiapine  50 mg Oral QHS  . thiamine  100 mg Oral Daily  . valproic acid  1,000 mg Oral BID   Continuous Infusions: . sodium chloride Stopped (08/26/20 0439)  . cefTRIAXone (ROCEPHIN)  IV 1 g (08/30/20 0930)     LOS: 7 days    Time spent: 25 -minute    Alberteen Sam, MD [facility] 08/30/2020, 6:13 PM     Please page though AMION or Epic secure chat:  For [employer], Higher education careers adviser

## 2020-08-30 NOTE — Progress Notes (Signed)
Received phone call from pt brother expressing concerns about her discharge. RN advised to discuss with family, brother stated he understood.

## 2020-08-31 ENCOUNTER — Inpatient Hospital Stay (HOSPITAL_COMMUNITY)
Admission: RE | Admit: 2020-08-31 | Discharge: 2020-10-27 | DRG: 070 | Disposition: A | Discharge status: Home / Self Care | Payer: Medicaid Other | Source: Intra-hospital | Attending: Physical Medicine & Rehabilitation | Admitting: Physical Medicine & Rehabilitation

## 2020-08-31 DIAGNOSIS — E785 Hyperlipidemia, unspecified: Secondary | ICD-10-CM | POA: Diagnosis present

## 2020-08-31 DIAGNOSIS — N179 Acute kidney failure, unspecified: Secondary | ICD-10-CM | POA: Diagnosis not present

## 2020-08-31 DIAGNOSIS — G9340 Encephalopathy, unspecified: Secondary | ICD-10-CM

## 2020-08-31 DIAGNOSIS — R1310 Dysphagia, unspecified: Secondary | ICD-10-CM | POA: Diagnosis not present

## 2020-08-31 DIAGNOSIS — Z7151 Drug abuse counseling and surveillance of drug abuser: Secondary | ICD-10-CM

## 2020-08-31 DIAGNOSIS — R7989 Other specified abnormal findings of blood chemistry: Secondary | ICD-10-CM | POA: Diagnosis not present

## 2020-08-31 DIAGNOSIS — Z7902 Long term (current) use of antithrombotics/antiplatelets: Secondary | ICD-10-CM

## 2020-08-31 DIAGNOSIS — F05 Delirium due to known physiological condition: Secondary | ICD-10-CM | POA: Diagnosis present

## 2020-08-31 DIAGNOSIS — I10 Essential (primary) hypertension: Secondary | ICD-10-CM | POA: Diagnosis present

## 2020-08-31 DIAGNOSIS — G939 Disorder of brain, unspecified: Secondary | ICD-10-CM | POA: Diagnosis present

## 2020-08-31 DIAGNOSIS — E440 Moderate protein-calorie malnutrition: Secondary | ICD-10-CM

## 2020-08-31 DIAGNOSIS — Z681 Body mass index (BMI) 19 or less, adult: Secondary | ICD-10-CM | POA: Diagnosis not present

## 2020-08-31 DIAGNOSIS — E43 Unspecified severe protein-calorie malnutrition: Secondary | ICD-10-CM | POA: Diagnosis present

## 2020-08-31 DIAGNOSIS — R059 Cough, unspecified: Secondary | ICD-10-CM

## 2020-08-31 DIAGNOSIS — Z8249 Family history of ischemic heart disease and other diseases of the circulatory system: Secondary | ICD-10-CM

## 2020-08-31 DIAGNOSIS — Z79899 Other long term (current) drug therapy: Secondary | ICD-10-CM

## 2020-08-31 DIAGNOSIS — Z8673 Personal history of transient ischemic attack (TIA), and cerebral infarction without residual deficits: Secondary | ICD-10-CM | POA: Diagnosis not present

## 2020-08-31 DIAGNOSIS — G40901 Epilepsy, unspecified, not intractable, with status epilepticus: Secondary | ICD-10-CM | POA: Diagnosis not present

## 2020-08-31 DIAGNOSIS — G8191 Hemiplegia, unspecified affecting right dominant side: Secondary | ICD-10-CM | POA: Diagnosis present

## 2020-08-31 DIAGNOSIS — R1311 Dysphagia, oral phase: Secondary | ICD-10-CM | POA: Diagnosis present

## 2020-08-31 DIAGNOSIS — G9341 Metabolic encephalopathy: Secondary | ICD-10-CM | POA: Diagnosis present

## 2020-08-31 DIAGNOSIS — R471 Dysarthria and anarthria: Secondary | ICD-10-CM | POA: Diagnosis present

## 2020-08-31 DIAGNOSIS — R1312 Dysphagia, oropharyngeal phase: Secondary | ICD-10-CM | POA: Diagnosis not present

## 2020-08-31 DIAGNOSIS — R451 Restlessness and agitation: Secondary | ICD-10-CM

## 2020-08-31 DIAGNOSIS — G40909 Epilepsy, unspecified, not intractable, without status epilepticus: Secondary | ICD-10-CM | POA: Diagnosis not present

## 2020-08-31 MED ORDER — ATORVASTATIN CALCIUM 40 MG PO TABS
40.0000 mg | ORAL_TABLET | Freq: Every day | ORAL | Status: DC
  Administered 2020-08-31 – 2020-10-26 (×57): 40 mg via ORAL
  Filled 2020-08-31 (×57): qty 1

## 2020-08-31 MED ORDER — ENOXAPARIN SODIUM 40 MG/0.4ML ~~LOC~~ SOLN: 40.0000 mg | SUBCUTANEOUS | Status: DC

## 2020-08-31 MED ORDER — ENSURE ENLIVE PO LIQD
237.0000 mL | Freq: Three times a day (TID) | ORAL | Status: DC
  Administered 2020-08-31 – 2020-10-27 (×141): 237 mL via ORAL

## 2020-08-31 MED ORDER — DIVALPROEX SODIUM 250 MG PO DR TAB: 250.0000 mg | DELAYED_RELEASE_TABLET | Freq: Two times a day (BID) | ORAL | 0 refills | Status: DC

## 2020-08-31 MED ORDER — FOLIC ACID 1 MG PO TABS
1.0000 mg | ORAL_TABLET | Freq: Every day | ORAL | Status: DC
  Administered 2020-09-01 – 2020-10-27 (×57): 1 mg via ORAL
  Filled 2020-08-31 (×57): qty 1

## 2020-08-31 MED ORDER — QUETIAPINE FUMARATE 50 MG PO TABS
50.0000 mg | ORAL_TABLET | Freq: Every day | ORAL | Status: DC
  Administered 2020-08-31: 50 mg via ORAL
  Filled 2020-08-31: qty 1

## 2020-08-31 MED ORDER — METOPROLOL SUCCINATE ER 50 MG PO TB24
50.0000 mg | ORAL_TABLET | Freq: Every day | ORAL | Status: DC
  Administered 2020-09-01 – 2020-09-04 (×4): 50 mg via ORAL
  Filled 2020-08-31 (×4): qty 1

## 2020-08-31 MED ORDER — PANTOPRAZOLE SODIUM 40 MG PO TBEC: 40.0000 mg | DELAYED_RELEASE_TABLET | Freq: Every day | ORAL | 1 refills | Status: DC

## 2020-08-31 MED ORDER — ENOXAPARIN SODIUM 40 MG/0.4ML ~~LOC~~ SOLN
40.0000 mg | SUBCUTANEOUS | Status: DC
  Administered 2020-09-01 – 2020-10-26 (×55): 40 mg via SUBCUTANEOUS
  Filled 2020-08-31 (×56): qty 0.4

## 2020-08-31 MED ORDER — QUETIAPINE FUMARATE 50 MG PO TABS: 50.0000 mg | ORAL_TABLET | Freq: Every day | ORAL | 0 refills | Status: DC

## 2020-08-31 MED ORDER — CEPHALEXIN 250 MG PO CAPS
500.0000 mg | ORAL_CAPSULE | Freq: Three times a day (TID) | ORAL | Status: AC
  Administered 2020-08-31 – 2020-09-02 (×5): 500 mg via ORAL
  Filled 2020-08-31 (×5): qty 2

## 2020-08-31 MED ORDER — ADULT MULTIVITAMIN W/MINERALS CH
1.0000 | ORAL_TABLET | Freq: Every day | ORAL | Status: DC
  Administered 2020-09-01 – 2020-10-27 (×57): 1 via ORAL
  Filled 2020-08-31 (×57): qty 1

## 2020-08-31 MED ORDER — AMLODIPINE BESYLATE 5 MG PO TABS
5.0000 mg | ORAL_TABLET | Freq: Every day | ORAL | Status: DC
  Administered 2020-09-01: 5 mg via ORAL
  Filled 2020-08-31: qty 1

## 2020-08-31 MED ORDER — ACETAMINOPHEN 325 MG PO TABS
650.0000 mg | ORAL_TABLET | Freq: Four times a day (QID) | ORAL | Status: DC | PRN
  Administered 2020-09-07 – 2020-10-25 (×7): 650 mg via ORAL
  Filled 2020-08-31 (×7): qty 2

## 2020-08-31 MED ORDER — VALPROIC ACID 250 MG/5ML PO SOLN
1000.0000 mg | Freq: Two times a day (BID) | ORAL | Status: DC
  Administered 2020-08-31 – 2020-09-08 (×16): 1000 mg via ORAL
  Filled 2020-08-31 (×16): qty 20

## 2020-08-31 MED ORDER — THIAMINE HCL 100 MG PO TABS
100.0000 mg | ORAL_TABLET | Freq: Every day | ORAL | Status: DC
  Administered 2020-09-01 – 2020-09-18 (×18): 100 mg via ORAL
  Filled 2020-08-31 (×18): qty 1

## 2020-08-31 MED ORDER — LACOSAMIDE 50 MG PO TABS
200.0000 mg | ORAL_TABLET | Freq: Two times a day (BID) | ORAL | Status: DC
  Administered 2020-08-31 – 2020-10-27 (×113): 200 mg via ORAL
  Filled 2020-08-31 (×112): qty 4

## 2020-08-31 MED ORDER — POTASSIUM CHLORIDE CRYS ER 20 MEQ PO TBCR: 40.0000 meq | EXTENDED_RELEASE_TABLET | Freq: Two times a day (BID) | ORAL | 0 refills | Status: DC

## 2020-08-31 MED ORDER — CEPHALEXIN 500 MG PO CAPS: 500.0000 mg | ORAL_CAPSULE | Freq: Three times a day (TID) | ORAL | 0 refills | Status: DC

## 2020-08-31 MED ORDER — QUETIAPINE FUMARATE 50 MG PO TABS
50.0000 mg | ORAL_TABLET | Freq: Two times a day (BID) | ORAL | Status: DC | PRN
  Administered 2020-09-01: 50 mg via ORAL
  Filled 2020-08-31: qty 1

## 2020-08-31 MED ORDER — ATORVASTATIN CALCIUM 40 MG PO TABS: 40.0000 mg | ORAL_TABLET | Freq: Every day | ORAL | 1 refills | Status: DC

## 2020-08-31 MED ORDER — ASPIRIN 325 MG PO TABS
325.0000 mg | ORAL_TABLET | Freq: Every day | ORAL | Status: DC
  Administered 2020-09-01 – 2020-10-27 (×57): 325 mg via ORAL
  Filled 2020-08-31 (×57): qty 1

## 2020-08-31 MED ORDER — PANTOPRAZOLE SODIUM 40 MG PO TBEC
40.0000 mg | DELAYED_RELEASE_TABLET | Freq: Every day | ORAL | Status: DC
  Administered 2020-09-01 – 2020-09-27 (×27): 40 mg via ORAL
  Filled 2020-08-31 (×26): qty 1

## 2020-08-31 NOTE — Discharge Summary (Signed)
Physician Discharge Summary  LYRA ALAIMO ZOX:[id number] DOB: [birth year] DOA: 08/23/2020  PCP: Avon Gully, MD  Admit date: 08/23/2020 Discharge date: 08/31/2020  Admitted From: Home Disposition:  CIR  Recommendations for Outpatient Follow-up:  1. Follow up with PCP in 1-2 weeks 2. Please obtain BMP/CBC in one week 3. Please follow up on the following pending results:  Home Health: No Equipment/Devices:n/a   Discharge Condition: Stable CODE STATUS: DNR Diet recommendation: heart healthy  Brief/Interim Summary: Mrs. Hellmann is a 60 y.o. F with HTN, seizures, history of status epilepticus, history of polysubstance abuse of cocaine and alcohol, and cerebrovascular disease who presented with altered mental status.  Evidently the patient began having seizures the night of admission.  This recurred off and on for several hours until EMS were activated.  In the ER, the aptietn was post-ictal and confused.  CT head unremarkable.  She was loaded with Keppra and Vimpat and admitted for continuous EEG>     4/8-4/11: on LTM EEG with cortical dysfunction in left hemisphere, no frank seizures but mental status remaining somnolent and confused  Discharge Diagnoses:  Active Problems:   HYPERCHOLESTEROLEMIA   Essential hypertension   Elevated AST (SGOT)   Seizures (HCC)   SIRS (systemic inflammatory response syndrome) (HCC)   Acute metabolic encephalopathy   Polysubstance abuse (HCC)   AKI (acute kidney injury) (HCC)   History of stroke   Malnutrition of moderate degree  Status epilepticus Patient was admitted and loaded with Vimpat and Keppra.  Placed on LTM EEG that showed persistent left-sided cortical dysfunction without frank seizures.    Her mentation has started to improve somewhat.  Likely her current decreased mental status represents cerebral insult due to prolonged status, hopefully this will improve, unclear if it will completely improve. - Consult neurology, appreciate  cares - Continue Vimpat and changed from Keppra to Depakote     Delirium and acute metabolic encephalopathy due to status epilepticus At baseline since her admission for seizures in early 2021, the patient has been oriented to self, and independent for self cares, although very weak, and using a cane and somewhat unsteady on her feet for the last year and a half, but mentally alert.  Delirium precautions:              -Lights and TV off, minimize interruptions at night             -Blinds open and lights on during day             -Glasses/hearing aid with patient             -Frequent reorientation             -PT/OT when able             -Avoid sedation medications/Beers list medications   -Stop benzodiazepines -Start scheduled Seroquel at night -Changed to Depakote for seizures and mood stabiliztion -PRN Seroquel for agitation   Hypertension Cerebrovascular disease BP elevated -Continue aspirin and resume atorvastatin -Resume home amlodipine and metoprolol  Severe protein calorie malnutrition As evidenced by BMI 19, loss of 8% body weight since last year when her weight was 61 kg, as well as severe decreased muscle mass and fat, encephalopathy leading up less than 50% oral intake  UTI Fever on 4/12, urine culture today with multiple species - Repeat urine culture - Transition Rocephin to Keflex for total 5 days   Hypomagnesemia Resolved with treatment  History of polysubstance abuse At this point  outside the window for withdrawal, although I suspect this is part of the reason she has persistent encephalopathy due to her decreased neurological reserve -Continue thiamine and folate  Discharge Instructions:  Discharge Instructions    Call MD for:  persistant nausea and vomiting   Complete by: As directed    Call MD for:  severe uncontrolled pain   Complete by: As directed    Call MD for:  temperature >100.4   Complete by: As directed    Diet - low sodium  heart healthy   Complete by: As directed    Increase activity slowly   Complete by: As directed      Allergies as of 08/31/2020   No Known Allergies     Medication List    STOP taking these medications   levETIRAcetam 750 MG tablet Commonly known as: KEPPRA   simvastatin 20 MG tablet Commonly known as: ZOCOR     TAKE these medications   amitriptyline 25 MG tablet Commonly known as: ELAVIL Take 25 mg by mouth at bedtime.   amLODipine 10 MG tablet Commonly known as: NORVASC TAKE 1 TABLET BY MOUTH EVERY DAY (NEEDS TO MAKE AN APPOINTMENT) What changed:   how much to take  how to take this  when to take this  additional instructions   atorvastatin 40 MG tablet Commonly known as: LIPITOR Take 1 tablet (40 mg total) by mouth at bedtime. What changed:   medication strength  how much to take   cephALEXin 500 MG capsule Commonly known as: KEFLEX Take 1 capsule (500 mg total) by mouth every 8 (eight) hours for 2 days.   clopidogrel 75 MG tablet Commonly known as: PLAVIX Take 75 mg by mouth daily.   divalproex 250 MG DR tablet Commonly known as: Depakote Take 1 tablet (250 mg total) by mouth 2 (two) times daily.   folic acid 1 MG tablet Commonly known as: FOLVITE Take 1 tablet (1 mg total) by mouth daily.   lacosamide 200 MG Tabs tablet Commonly known as: VIMPAT Take 1 tablet (200 mg total) by mouth 2 (two) times daily.   metoprolol tartrate 25 MG tablet Commonly known as: LOPRESSOR Take 1 tablet (25 mg total) by mouth 2 (two) times daily.   multivitamin with minerals Tabs tablet Take 1 tablet by mouth daily.   pantoprazole 40 MG tablet Commonly known as: PROTONIX Take 1 tablet (40 mg total) by mouth daily. What changed: how much to take   potassium chloride SA 20 MEQ tablet Commonly known as: KLOR-CON Take 2 tablets (40 mEq total) by mouth 2 (two) times daily for 2 days.   QUEtiapine 50 MG tablet Commonly known as: SEROQUEL Take 1 tablet (50 mg  total) by mouth at bedtime.   thiamine 100 MG tablet Take 1 tablet (100 mg total) by mouth daily.       Follow-up Information    Avon GullyFanta, Tesfaye, MD Follow up.   Specialty: Internal Medicine Contact information: [address] [place] [id number] [phone]              No Known Allergies  Consultations:  Neurology   Procedures/Studies: DG Chest 2 View  Result Date: 08/27/2020 CLINICAL DATA:  Fever. EXAM: CHEST - 2 VIEW COMPARISON:  08/23/2020 FINDINGS: Persistent low lung volumes. Stable heart size and mediastinal contours. Streaky left lung base atelectasis. There is a small left pleural effusion. No pneumothorax. Small bore catheter projects in the left supraclavicular region. The bones are under mineralized. Chronic  right AC joint separation. IMPRESSION: Low lung volumes with streaky left lung base atelectasis and small left pleural effusion. Electronically Signed   By: Narda Rutherford M.D.   On: 08/27/2020 18:57   MR BRAIN WO CONTRAST  Result Date: 08/28/2020 CLINICAL DATA:  60 year old female code stroke presentation on 08/23/2020 with no acute intracranial abnormality by MRI at that time. Unexplained altered mental status. EXAM: MRI HEAD WITHOUT CONTRAST TECHNIQUE: Multiplanar, multiecho pulse sequences of the brain and surrounding structures were obtained without intravenous contrast. COMPARISON:  Brain MRI 08/23/2020 and earlier. FINDINGS: The examination had to be discontinued prior to completion due to patient agitation. No axial T1 or coronal T2 weighted imaging could be obtained. The remaining study is mildly degraded by motion. Brain: No restricted diffusion to suggest acute infarction. No midline shift, mass effect, evidence of mass lesion, ventriculomegaly, extra-axial collection or acute intracranial hemorrhage. Cervicomedullary junction and pituitary are within normal limits. Cerebral volume loss seems advanced for age, and perhaps disproportionately  involving the brainstem and cerebellum. There is confluent bilateral cerebral white matter T2 and FLAIR hyperintensity in a nonspecific configuration. Temporal lobes are relatively spared. T2 heterogeneity in the bilateral deep gray matter nuclei and left corona radiata most resembles chronic lacunar infarcts. Comparatively mild T2 heterogeneity in the pons. Possible small chronic lacunar infarct in the left superior cerebellum on series 10, image 9. No chronic cerebral blood products are evident. No cortical encephalomalacia identified. Vascular: *INSERT* flow void are stable. Mild generalized intracranial artery tortuosity. Skull and upper cervical spine: Partially visible cervical spine degeneration including mild anterolisthesis of C3 on C4. Visualized bone marrow signal is within normal limits. Sinuses/Orbits: Increased mild bilateral paranasal sinus mucosal thickening since last week. Orbits appear stable and negative. Other: Stable mild mastoid effusions. Grossly normal visible internal auditory structures. IMPRESSION: 1. No acute intracranial abnormality. The examination had to be discontinued prior to completion due to patient agitation. 2. Advanced but nonspecific cerebral white matter signal changes. Favor small vessel disease related given evidence of chronic lacunar infarcts in the left corona radiata, the bilateral deep gray matter nuclei, and probably also the left cerebellum. 3. Mild new bilateral paranasal sinus inflammation. Electronically Signed   By: Odessa Fleming M.D.   On: 08/28/2020 08:08   MR BRAIN W WO CONTRAST  Result Date: 08/23/2020 CLINICAL DATA:  Stroke follow-up. EXAM: MRI HEAD WITHOUT AND WITH CONTRAST TECHNIQUE: Multiplanar, multiecho pulse sequences of the brain and surrounding structures were obtained without and with intravenous contrast. CONTRAST:  6mL GADAVIST GADOBUTROL 1 MMOL/ML IV SOLN COMPARISON:  Head CT August 23, 2020. FINDINGS: Brain: No acute infarction, hemorrhage,  hydrocephalus, extra-axial collection or mass lesion. Remote lacunar infarct in the bilateral centrum semiovale, corona radiata and right thalamus. Scattered confluent foci of T2 hyperintensity are seen within the white matter of the cerebral hemispheres, nonspecific. Mild parenchymal volume loss. No focus of abnormal contrast enhancement. Vascular: Normal flow voids. Skull and upper cervical spine: Normal marrow signal. Sinuses/Orbits: Negative. Other: Small right mastoid effusion. IMPRESSION: 1. No acute intracranial abnormality. 2. Remote lacunar infarct in the bilateral centrum semiovale, corona radiata and right thalamus. 3. Advanced chronic microvascular ischemic changes. Mild parenchymal volume loss. 4. Small right mastoid effusion. Electronically Signed   By: Baldemar Lenis M.D.   On: 08/23/2020 15:18   CT C-SPINE NO CHARGE  Result Date: 08/23/2020 CLINICAL DATA:  Status post fall. EXAM: CT CERVICAL SPINE WITHOUT CONTRAST TECHNIQUE: Multidetector CT imaging of the cervical spine was performed without  intravenous contrast. Multiplanar CT image reconstructions were also generated. COMPARISON:  August 02, 2008 FINDINGS: Alignment: Approximately 1 mm to 2 mm retrolisthesis of the C4 vertebral body is seen on C5 and C5 vertebral body on C6. Skull base and vertebrae: No acute fracture. No primary bone lesion or focal pathologic process. Soft tissues and spinal canal: No prevertebral fluid or swelling. No visible canal hematoma. Disc levels: Marked severity endplate sclerosis and osteophyte formation is seen at the levels of C4-C5, C5-C6 and C6-C7. Marked severity intervertebral disc space narrowing is also seen at the levels of C4-C5, C5-C6 and C6-C7. Moderate to marked severity bilateral multilevel facet joint hypertrophy is noted. Upper chest: Negative. Other: None. IMPRESSION: 1. Marked severity multilevel degenerative changes, most prominent at the levels of C4-C5, C5-C6 and C6-C7. 2. No acute  cervical spine fracture. Electronically Signed   By: Aram Candela M.D.   On: 08/23/2020 16:30   DG CHEST PORT 1 VIEW  Result Date: 08/23/2020 CLINICAL DATA:  Seizure EXAM: PORTABLE CHEST 1 VIEW COMPARISON:  02/09/2020 FINDINGS: Heart is borderline in size. Lungs are clear. No effusions or acute bony abnormality. IMPRESSION: No active disease. Electronically Signed   By: Charlett Nose M.D.   On: 08/23/2020 18:28   Overnight EEG with video  Result Date: 08/30/2020 Charlsie Quest, MD     08/31/2020  8:55 AM Patient Name:Ashleyanne SUANN KLIER UXL:[id number] Epilepsy Attending:Priyanka [name] Referring Physician/Provider:Dr Ritta Slot Duration:4/13/20221531 to 4/14/20221531  Patient history:59 yo F w possible seizure activity with left gaze deviation and shaking.EEG to evaluate for seizure.  Level of alertness:Awake, asleep  AEDs during EEG study:LEV, LCM  Technical aspects: This EEG study was done with scalp electrodes positioned according to the 10-20 International system of electrode placement. Electrical activity was acquired at a sampling rate of 500Hz  and reviewed with a high frequency filter of 70Hz  and a low frequency filter of 1Hz . EEG data were recorded continuously and digitally stored.  Description: The posterior dominant rhythm consists of8Hz  activity of moderate voltage (25-35 uV) seen predominantly in posterior head regions, symmetricand reactive to eye opening and eye closing. Sleep was characterized by vertex waves, sleep spindles (12 to 14 Hz), maximal frontocentral region. EEG showed continuousgeneralized 3 to 6 Hz theta-delta slowing.Hyperventilation and photic stimulation were not performed.   ABNORMALITY - Continuous slow, generalized IMPRESSION: This study is suggestive of mild to moderate diffuse encephalopathy, nonspecific etiology.No seizures ordefiniteepileptiform discharges were seen throughout the recording.   Priyanka  Overnight EEG  with video  Result Date: 08/24/2020 , MD     08/25/2020  3:55 AM Patient Name: JENNILYN ESTEVE MRN: Charlsie Quest Epilepsy Attending: 10/25/2020 Referring Physician/Provider: Dr Treasa School Duration: 08/23/2020 1601 to 08/24/2020 0730 Patient history: 60 yo F w possible seizure activity with left gaze deviation and shaking. EEG to evaluate for seizure. Level of alertness: Awake, asleep AEDs during EEG study: LEV, LCM Technical aspects: This EEG study was done with scalp electrodes positioned according to the 10-20 International system of electrode placement. Electrical activity was acquired at a sampling rate of 500Hz  and reviewed with a high frequency filter of 70Hz  and a low frequency filter of 1Hz . EEG data were recorded continuously and digitally stored. Description: The posterior dominant rhythm consists of 8 Hz activity of moderate voltage (25-35 uV) seen predominantly in posterior head regions, asymmetric ( L<R) and reactive to eye opening and eye closing. Drowsiness was characterized by attenuation of the posterior background rhythm. Sleep was  characterized by vertex waves, sleep spindles (12 to 14 Hz), maximal right frontocentral region. EEG showed continuous 3 to 6 Hz theta-delta slowing in left heisphere. Sharp transients were seen in eft occipital region.  Hyperventilation and photic stimulation were not performed.   ABNORMALITY - Continuous slow, Left hemisphere - Background asymmetry, left <right - Sleep asymmetry, Left<right IMPRESSION: This study is suggestive of cortical dysfunction arising from left hemisphere likely secondary to underlying structural abnormality, post-ictal state. No seizures or definite epileptiform discharges were seen throughout the recording. Charlsie Quest   CT HEAD CODE STROKE WO CONTRAST  Result Date: 08/23/2020 CLINICAL DATA:  Code stroke.  Right-sided weakness EXAM: CT HEAD WITHOUT CONTRAST TECHNIQUE: Contiguous axial images were obtained from the base of  the skull through the vertex without intravenous contrast. COMPARISON:  02/09/2020 FINDINGS: Brain: No acute intracranial hemorrhage, mass effect, or edema. No definite new loss of gray-white differentiation. A small area hypoattenuation along the anterior left insula appears to be through an area of artifact. There are chronic infarcts of the right putamen and thalamus. Additional patchy and confluent areas of hypoattenuation in the supratentorial white matter are nonspecific but probably reflect similar moderate chronic microvascular ischemic changes. Prominence of the ventricles and sulci reflects similar parenchymal volume loss. Vascular: No hyperdense vessel. There is intracranial atherosclerotic calcification at the skull base. Skull: Unremarkable. Sinuses/Orbits: No acute abnormality. Other: Mastoid air cells are clear. ASPECTS (Alberta Stroke Program Early CT Score) - Ganglionic level infarction (caudate, lentiform nuclei, internal capsule, insula, M1-M3 cortex): 7 - Supraganglionic infarction (M4-M6 cortex): 3 Total score (0-10 with 10 being normal): 10 IMPRESSION: There is no acute intracranial hemorrhage or evidence of acute infarction. ASPECT score is 10. These results were communicated to Dr. Roda Shutters at 1:45 pm on 08/23/2020 by text page via the [facility] messaging system. Electronically Signed   By: Guadlupe Spanish M.D.   On: 08/23/2020 13:47   CT ANGIO HEAD NECK W WO CM W PERF (CODE STROKE)  Result Date: 08/23/2020 CLINICAL DATA:  Right-sided weakness EXAM: CT HEAD WITHOUT CONTRAST CT ANGIOGRAPHY HEAD AND NECK CT PERFUSION BRAIN TECHNIQUE: Multidetector CT imaging of the head and neck was performed using the standard protocol during bolus administration of intravenous contrast. Multiplanar CT image reconstructions and MIPs were obtained to evaluate the vascular anatomy. Carotid stenosis measurements (when applicable) are obtained utilizing NASCET criteria, using the distal internal carotid diameter as the  denominator. Multiphase CT imaging of the brain was performed following IV bolus contrast injection. Subsequent parametric perfusion maps were calculated using RAPID software. CONTRAST:  OMNIPAQUE IOHEXOL 350 MG/ML SOLN COMPARISON:  None. FINDINGS: CTA NECK FINDINGS Aortic arch: Great vessel origins are patent. Right carotid system: Patent. Mild noncalcified plaque at the ICA origin causing minimal stenosis. Left carotid system: Patent. Mild mixed plaque at the ICA origin without stenosis. Vertebral arteries: Patent. Right vertebral artery is dominant. No stenosis. Skeleton: Degenerative changes of the cervical spine. Other neck: Negative. Upper chest: No apical lung mass. Review of the MIP images confirms the above findings CTA HEAD FINDINGS Anterior circulation: Intracranial internal carotid arteries are patent with mild calcified plaque. Anterior and middle cerebral arteries are patent. Mild stenosis of the mid left M1 MCA. Posterior circulation: Intracranial vertebral arteries are patent with mild calcified plaque. Basilar artery is patent. Major cerebellar artery origins are patent. Posterior cerebral arteries are patent. A right posterior communicating artery is present. Venous sinuses: As permitted by contrast timing, patent. Review of the MIP images confirms the  above findings CT Brain Perfusion Findings: CBF (<30%) Volume: 0mL Perfusion (Tmax>6.0s) volume: 8mL Mismatch Volume: 8mL, likely artifactual. Infarction Location:  None. IMPRESSION: No large vessel occlusion or hemodynamically significant stenosis. Mild left mid M1 MCA stenosis. Perfusion imaging demonstrates no evidence of core infarction or penumbra. 8 mL of calculated penumbra in the bilateral anterior frontal lobes is likely artifactual. Electronically Signed   By: Guadlupe Spanish M.D.   On: 08/23/2020 14:09       Subjective: Feels well today. She has no complaints.  Discharge Exam: Vitals:   08/31/20 0339 08/31/20 0700  BP: (!)  148/96 (!) 147/93  Pulse: 88 90  Resp: 18 18  Temp: 98.1 F (36.7 C) 97.9 F (36.6 C)  SpO2:  98%   Vitals:   08/30/20 2114 08/30/20 2321 08/31/20 0339 08/31/20 0700  BP: 122/80 (!) 151/100 (!) 148/96 (!) 147/93  Pulse: 92 (!) 106 88 90  Resp: 16  18 18   Temp:  98.8 F (37.1 C) 98.1 F (36.7 C) 97.9 F (36.6 C)  TempSrc:  Oral Oral Axillary  SpO2:    98%  Weight:      Height:        General: Pt is alert, awake, not in acute distress, oriented to self Cardiovascular: RRR, S1/S2 +, no rubs, no gallops Respiratory: CTA bilaterally, no wheezing, no rhonchi Abdominal: Soft, NT, ND, bowel sounds + Extremities: no edema, no cyanosis, moves all, right weaker than left    The results of significant diagnostics from this hospitalization (including imaging, microbiology, ancillary and laboratory) are listed below for reference.     Microbiology: Recent Results (from the past 240 hour(s))  Resp Panel by RT-PCR (Flu A&B, Covid) Nasopharyngeal Swab     Status: None   Collection Time: 08/23/20  5:09 PM   Specimen: Nasopharyngeal Swab; Nasopharyngeal(NP) swabs in vial transport medium  Result Value Ref Range Status   SARS Coronavirus 2 by RT PCR NEGATIVE NEGATIVE Final    Comment: (NOTE) SARS-CoV-2 target nucleic acids are NOT DETECTED.  The SARS-CoV-2 RNA is generally detectable in upper respiratory specimens during the acute phase of infection. The lowest concentration of SARS-CoV-2 viral copies this assay can detect is 138 copies/mL. A negative result does not preclude SARS-Cov-2 infection and should not be used as the sole basis for treatment or other patient management decisions. A negative result may occur with  improper specimen collection/handling, submission of specimen other than nasopharyngeal swab, presence of viral mutation(s) within the areas targeted by this assay, and inadequate number of viral copies(<138 copies/mL). A negative result must be combined  with clinical observations, patient history, and epidemiological information. The expected result is Negative.  Fact Sheet for Patients:  BloggerCourse.com  Fact Sheet for Healthcare Providers:  SeriousBroker.it  This test is no t yet approved or cleared by the [place] FDA and  has been authorized for detection and/or diagnosis of SARS-CoV-2 by FDA under an Emergency Use Authorization (EUA). This EUA will remain  in effect (meaning this test can be used) for the duration of the COVID-19 declaration under Section 564(b)(1) of the Act, 21 U.S.C.section 360bbb-3(b)(1), unless the authorization is terminated  or revoked sooner.       Influenza A by PCR NEGATIVE NEGATIVE Final   Influenza B by PCR NEGATIVE NEGATIVE Final    Comment: (NOTE) The Xpert Xpress SARS-CoV-2/FLU/RSV plus assay is intended as an aid in the diagnosis of influenza from Nasopharyngeal swab specimens and should not be used as a  sole basis for treatment. Nasal washings and aspirates are unacceptable for Xpert Xpress SARS-CoV-2/FLU/RSV testing.  Fact Sheet for Patients: BloggerCourse.com  Fact Sheet for Healthcare Providers: SeriousBroker.it  This test is not yet approved or cleared by the [place] FDA and has been authorized for detection and/or diagnosis of SARS-CoV-2 by FDA under an Emergency Use Authorization (EUA). This EUA will remain in effect (meaning this test can be used) for the duration of the COVID-19 declaration under Section 564(b)(1) of the Act, 21 U.S.C. section 360bbb-3(b)(1), unless the authorization is terminated or revoked.  Performed at [facility], [address] [place], [place] [address]   Culture, blood (Routine X 2) w Reflex to ID Panel     Status: None (Preliminary result)   Collection Time: 08/27/20 11:02 AM   Specimen: BLOOD  Result Value Ref Range Status    Specimen Description BLOOD BLOOD RIGHT HAND  Final   Special Requests   Final    BOTTLES DRAWN AEROBIC AND ANAEROBIC Blood Culture results may not be optimal due to an inadequate volume of blood received in culture bottles   Culture   Final    NO GROWTH 4 DAYS Performed at [facility], [address], [place], [place] [address]    Report Status PENDING  Incomplete  Culture, blood (Routine X 2) w Reflex to ID Panel     Status: None (Preliminary result)   Collection Time: 08/27/20 11:02 AM   Specimen: BLOOD  Result Value Ref Range Status   Specimen Description BLOOD LEFT ANTECUBITAL  Final   Special Requests   Final    BOTTLES DRAWN AEROBIC AND ANAEROBIC Blood Culture adequate volume   Culture   Final    NO GROWTH 4 DAYS Performed at [facility], [address], [place], [place] [address]    Report Status PENDING  Incomplete  Culture, Urine     Status: Abnormal   Collection Time: 08/28/20  9:00 PM   Specimen: Urine, Random  Result Value Ref Range Status   Specimen Description URINE, RANDOM  Final   Special Requests   Final    NONE Performed at [facility], [address] [place], [place] [address]    Culture (A)  Final    40,000 COLONIES/mL MULTIPLE SPECIES PRESENT, SUGGEST RECOLLECTION   Report Status 08/30/2020 FINAL  Final     Labs: BNP (last 3 results) No results for input(s): BNP in the last 8760 hours. Basic Metabolic Panel: Recent Labs  Lab 08/25/20 0931 08/26/20 0247 08/27/20 1102 08/27/20 1426 08/28/20 0444 08/30/20 0245  NA 135 135  --  132* 134* 135  K 3.1* 3.1*  --  2.7* 3.9 3.5  CL 106 104  --  103 106 105  CO2 21* 22  --  20* 22 23  GLUCOSE 105* 106*  --  131* 135* 105*  BUN 8 6*  --  8 <5* <5*  CREATININE 0.87 0.92  --  0.84 0.81 0.94  CALCIUM 8.7* 8.7*  --  8.6* 8.8* 9.2  MG  --   --  1.6*  --  1.9  --   PHOS  --   --  2.9  --  2.7  --    Liver Function Tests: Recent Labs  Lab 08/30/20 0245  AST 21  ALT 13  ALKPHOS 51   BILITOT 0.5  PROT 6.5  ALBUMIN 2.7*   CBC: Recent Labs  Lab 08/25/20 0931 08/27/20 1102 08/28/20  0444 08/30/20 0245  WBC 9.2 11.3* 9.0 4.5  NEUTROABS 7.2 8.9*  --   --   HGB 13.2 12.6 11.5* 11.2*  HCT 38.9 36.5 33.1* 32.8*  MCV 93.7 89.9 91.2 91.4  PLT 304 312 316 381   Cardiac Enzymes: Recent Labs  Lab 08/26/20 0247 08/27/20 1102  CKTOTAL 415* 534*   CBG: Recent Labs  Lab 08/26/20 2025  GLUCAP 97   Urinalysis    Component Value Date/Time   COLORURINE YELLOW 08/27/2020 1620   APPEARANCEUR CLEAR 08/27/2020 1620   LABSPEC 1.012 08/27/2020 1620   PHURINE 7.0 08/27/2020 1620   GLUCOSEU NEGATIVE 08/27/2020 1620   HGBUR SMALL (A) 08/27/2020 1620   BILIRUBINUR NEGATIVE 08/27/2020 1620   KETONESUR NEGATIVE 08/27/2020 1620   PROTEINUR NEGATIVE 08/27/2020 1620   NITRITE POSITIVE (A) 08/27/2020 1620   LEUKOCYTESUR NEGATIVE 08/27/2020 1620   Sepsis Labs Microbiology Recent Results (from the past 240 hour(s))  Resp Panel by RT-PCR (Flu A&B, Covid) Nasopharyngeal Swab     Status: None   Collection Time: 08/23/20  5:09 PM   Specimen: Nasopharyngeal Swab; Nasopharyngeal(NP) swabs in vial transport medium  Result Value Ref Range Status   SARS Coronavirus 2 by RT PCR NEGATIVE NEGATIVE Final    Comment: (NOTE) SARS-CoV-2 target nucleic acids are NOT DETECTED.  The SARS-CoV-2 RNA is generally detectable in upper respiratory specimens during the acute phase of infection. The lowest concentration of SARS-CoV-2 viral copies this assay can detect is 138 copies/mL. A negative result does not preclude SARS-Cov-2 infection and should not be used as the sole basis for treatment or other patient management decisions. A negative result may occur with  improper specimen collection/handling, submission of specimen other than nasopharyngeal swab, presence of viral mutation(s) within the areas targeted by this assay, and inadequate number of viral copies(<138 copies/mL). A negative  result must be combined with clinical observations, patient history, and epidemiological information. The expected result is Negative.  Fact Sheet for Patients:  BloggerCourse.com  Fact Sheet for Healthcare Providers:  SeriousBroker.it  This test is no t yet approved or cleared by the [place] FDA and  has been authorized for detection and/or diagnosis of SARS-CoV-2 by FDA under an Emergency Use Authorization (EUA). This EUA will remain  in effect (meaning this test can be used) for the duration of the COVID-19 declaration under Section 564(b)(1) of the Act, 21 U.S.C.section 360bbb-3(b)(1), unless the authorization is terminated  or revoked sooner.       Influenza A by PCR NEGATIVE NEGATIVE Final   Influenza B by PCR NEGATIVE NEGATIVE Final    Comment: (NOTE) The Xpert Xpress SARS-CoV-2/FLU/RSV plus assay is intended as an aid in the diagnosis of influenza from Nasopharyngeal swab specimens and should not be used as a sole basis for treatment. Nasal washings and aspirates are unacceptable for Xpert Xpress SARS-CoV-2/FLU/RSV testing.  Fact Sheet for Patients: BloggerCourse.com  Fact Sheet for Healthcare Providers: SeriousBroker.it  This test is not yet approved or cleared by the [place] FDA and has been authorized for detection and/or diagnosis of SARS-CoV-2 by FDA under an Emergency Use Authorization (EUA). This EUA will remain in effect (meaning this test can be used) for the duration of the COVID-19 declaration under Section 564(b)(1) of the Act, 21 U.S.C. section 360bbb-3(b)(1), unless the authorization is terminated or revoked.  Performed at [facility], [address] [place], [place] [address]   Culture, blood (Routine X 2) w Reflex to ID Panel  Status: None (Preliminary result)   Collection Time: 08/27/20 11:02 AM   Specimen: BLOOD  Result  Value Ref Range Status   Specimen Description BLOOD BLOOD RIGHT HAND  Final   Special Requests   Final    BOTTLES DRAWN AEROBIC AND ANAEROBIC Blood Culture results may not be optimal due to an inadequate volume of blood received in culture bottles   Culture   Final    NO GROWTH 4 DAYS Performed at [facility], [address] [place], [place] [address]    Report Status PENDING  Incomplete  Culture, blood (Routine X 2) w Reflex to ID Panel     Status: None (Preliminary result)   Collection Time: 08/27/20 11:02 AM   Specimen: BLOOD  Result Value Ref Range Status   Specimen Description BLOOD LEFT ANTECUBITAL  Final   Special Requests   Final    BOTTLES DRAWN AEROBIC AND ANAEROBIC Blood Culture adequate volume   Culture   Final    NO GROWTH 4 DAYS Performed at [facility], [address] [place], [place] [address]    Report Status PENDING  Incomplete  Culture, Urine     Status: Abnormal   Collection Time: 08/28/20  9:00 PM   Specimen: Urine, Random  Result Value Ref Range Status   Specimen Description URINE, RANDOM  Final   Special Requests   Final    NONE Performed at [facility], [address], [place], [place] [address]    Culture (A)  Final    40,000 COLONIES/mL MULTIPLE SPECIES PRESENT, SUGGEST RECOLLECTION   Report Status 08/30/2020 FINAL  Final     Time coordinating discharge: Over 30 minutes  SIGNED:   Reva Bores, MD  [facility] 08/31/2020, 11:46 AM  If 7PM-7AM, please contact night-coverage

## 2020-08-31 NOTE — Progress Notes (Signed)
Inpatient Rehabilitation Medication Review by a Pharmacist  A complete drug regimen review was completed for this patient to identify any potential clinically significant medication issues.  Clinically significant medication issues were identified:  no  Check AMION for pharmacist assigned to patient if future medication questions/issues arise during this admission.  Pharmacist comments: CIR meds consistent with meds on acute admit at discharge  Time spent performing this drug regimen review (minutes):  10   Lemar Bakos A. Jeanella Craze, PharmD, BCPS, FNKF Clinical Pharmacist [place] Please utilize Amion for appropriate phone number to reach the unit pharmacist [facility])  08/31/2020 7:39 PM

## 2020-08-31 NOTE — TOC Transition Note (Signed)
Transition of Care [facility]) - CM/SW Discharge Note   Patient Details  Name: Cindy [name] MRN: [id number] Date of Birth: [birth year]  [facility] CM/SW Contact:  Kermit Balo, RN Phone Number: 08/31/2020, 12:10 PM   Clinical Narrative:    Patient is discharging to CIR today. CM signing off.   Final next level of care: IP Rehab Facility Barriers to Discharge: No Barriers Identified   Patient Goals and CMS Choice        Discharge Placement                       Discharge Plan and Services                                     Social Determinants of Health (SDOH) Interventions     Readmission Risk Interventions No flowsheet data found.

## 2020-08-31 NOTE — H&P (Signed)
Physical Medicine and Rehabilitation Admission H&P    Chief Complaint  Patient presents with  . Code Stroke  : HPI: Cindy [name]. Meth is a 60 year old right-handed female history of hypertension, hyperlipidemia, bilateral cerebral convexity infarct frontal region receiving CIR 08/19/2019-08/27/2019 and maintained on Plavix, 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  Patient did have admissions 07/2019 for seizure as well as 01/2020.  Per chart review independent with assistive device prior to admission living with her spouse.  She does have supportive family in the area.  Presented 08/23/2020 with altered mental status/slurred speech, right-sided weakness as well as reports of shaking and jerking episodes lasting 5 to 10 minutes.  Cranial CT/MRI scan negative for acute changes but did show remote lacunar infarct in the bilateral centrum semiovale, corona radiata and right thalamus.  CT angiogram head and neck no large vessel occlusion or significant stenosis.  CT cervical spine no acute cervical spine fracture.  Admission chemistries alcohol negative, WBC 11,800, glucose 127, creatinine 1.21, lactic acid 2.0, urine drug screen positive cocaine, CK 1002, ammonia 13.  EEG suggestive of cortical dysfunction arising from left hemisphere likely secondary to underlying structural abnormality post ictal state no seizure or definite epileptiform discharges.  Neurology follow-up currently maintained on Vimpat 200 mg twice daily as well as valproic acid 1000 mg twice daily.  Patient with intermittent bouts of agitation restlessness maintained on Seroquel.  Subcutaneous Lovenox for DVT prophylaxis.  Urine culture 40,000 multiple species completing course of empiric Keflex.  Follow-up CK improved 534.  Tolerating mechanical soft diet.  Therapy evaluations completed due to patient decreased functional mobility was admitted for a comprehensive rehab program.  She is currently in bilateral mittens.   Review of Systems  Constitutional: Negative for chills and fever.  HENT: Negative for hearing loss.   Eyes: Negative for blurred vision and double vision.  Respiratory: Negative for shortness of breath.   Cardiovascular: Negative for chest pain, palpitations and leg swelling.  Gastrointestinal: Positive for constipation. Negative for heartburn, nausea and vomiting.  Genitourinary: Negative for dysuria, flank pain and hematuria.  Musculoskeletal: Positive for joint pain and myalgias.  Skin: Negative for rash.  Neurological: Positive for seizures and headaches.  All other systems reviewed and are negative.  Past Medical History:  Diagnosis Date  . Abnormal LFTs    Hx of  . CVA (cerebral vascular accident) (HCC) 2005  . Hyperlipidemia   . Hypertension   . Polysubstance dependence including opioid type drug, episodic abuse (HCC)    cocaine/etoh    Past Surgical History:  Procedure Laterality Date  . ESOPHAGOGASTRODUODENOSCOPY N/A 08/16/2019   Procedure: ESOPHAGOGASTRODUODENOSCOPY (EGD);  Surgeon: Vida Rigger, MD;  Location: [facility] ENDOSCOPY;  Service: Endoscopy;  Laterality: N/A;   Family History  Problem Relation Age of Onset  . Cancer Father        prostate   . Hypertension Mother   . Asthma Mother   . Bronchitis Mother    Social History:  reports that she has never smoked. She has never used smokeless tobacco. She reports current alcohol use. She reports that she does not use drugs. Allergies: No Known Allergies Medications Prior to Admission  Medication Sig Dispense Refill  . amitriptyline (ELAVIL) 25 MG tablet Take 25 mg by mouth at bedtime.    Marland Kitchen amLODipine (NORVASC) 10 MG tablet TAKE 1 TABLET BY MOUTH EVERY DAY (NEEDS TO MAKE AN APPOINTMENT) (Patient taking differently: Take 10 mg  by mouth daily. (NEEDS TO MAKE AN APPOINTMENT)) 30 tablet 1  . atorvastatin (LIPITOR) 10 MG tablet Take 10 mg by mouth at bedtime.    . clopidogrel (PLAVIX)  75 MG tablet Take 75 mg by mouth daily.    . folic acid (FOLVITE) 1 MG tablet Take 1 tablet (1 mg total) by mouth daily. 30 tablet 1  . levETIRAcetam (KEPPRA) 750 MG tablet Take 2 tablets (1,500 mg total) by mouth 2 (two) times daily. 120 tablet 1  . metoprolol tartrate (LOPRESSOR) 25 MG tablet Take 1 tablet (25 mg total) by mouth 2 (two) times daily. 60 tablet 1  . Multiple Vitamin (MULTIVITAMIN WITH MINERALS) TABS tablet Take 1 tablet by mouth daily.    . pantoprazole (PROTONIX) 40 MG tablet Take 40 tablets by mouth daily.    Marland Kitchen thiamine 100 MG tablet Take 1 tablet (100 mg total) by mouth daily. 30 tablet 1  . lacosamide (VIMPAT) 200 MG TABS tablet Take 1 tablet (200 mg total) by mouth 2 (two) times daily. (Patient not taking: Reported on 08/23/2020) 60 tablet 1  . simvastatin (ZOCOR) 20 MG tablet Take 1 tablet (20 mg total) by mouth daily at 6 PM. (Patient not taking: Reported on 08/23/2020) 30 tablet 1    Drug Regimen Review Drug regimen was reviewed and remains appropriate with no significant issues identified  Home: Home Living Family/patient expects to be discharged to:: Unsure Additional Comments: Pt poor historian - family not immediately available for dispo planning   Functional History: Prior Function Level of Independence: Independent with assistive device(s),Needs assistance (History obtained from prior admission) Gait / Transfers Assistance Needed: household ambulator using RW (Hx obtained from prior admission) ADL's / Homemaking Assistance Needed: assisted by family (Hx obtained from prior admission) Comments: Hx obtained from prior admission - pt unable to report at this time  Functional Status:  Mobility: Bed Mobility Overal bed mobility: Needs Assistance Bed Mobility: Rolling,Supine to Sit,Sit to Supine Rolling: Max assist Supine to sit: Max assist,+2 for safety/equipment,+2 for physical assistance Sit to supine: Max assist,+2 for physical assistance,+2 for  safety/equipment General bed mobility comments: Patient able to initiate trunk elevation but overall maxA+2 to complete bed mobility. Upon sitting EOB, able to maintain sitting balance with min guard, however severe trunk and neck flexion and inability to keep upright posture without maxA Transfers Overall transfer level: Needs assistance Equipment used: Rolling walker (2 wheeled) Transfers: Sit to/from [employer] Sit to Stand: Mod assist,+2 physical assistance,+2 safety/equipment Stand pivot transfers: Mod assist,+2 physical assistance,+2 safety/equipment General transfer comment: deferred due to lethargy      ADL: ADL Overall ADL's : Needs assistance/impaired Eating/Feeding: Set up,Bed level Eating/Feeding Details (indicate cue type and reason): Patient able to drink from a cup with a straw. Initially therapist controlled the bup but on second trial patient able to hold cup and bring straw to mouth. Patient able to eat half a cookie. Grooming: Wash/dry face,Bed level,Set up Grooming Details (indicate cue type and reason): Patient able to wash face with setup with fairly good quality. General ADL Comments: requires (A) for all adls.  Cognition: Cognition Overall Cognitive Status: Difficult to assess Orientation Level: Oriented to person Cognition Arousal/Alertness: Lethargic,Suspect due to medications (awake at end of session) Behavior During Therapy: Restless,Impulsive Overall Cognitive Status: Difficult to assess Area of Impairment: Awareness,Safety/judgement,Following commands,Memory,Attention,Orientation Orientation Level: Person,Place Current Attention Level: Sustained Memory: Decreased short-term memory Following Commands: Follows one step commands inconsistently Safety/Judgement: Decreased awareness of deficits,Decreased awareness of safety Awareness:  Emergent General Comments: No verbalizations this session. Difficult to assess cognition due to impaired  communication and lethargy Difficult to assess due to: Impaired communication  Physical Exam: Blood pressure (!) 148/96, pulse 88, temperature 98.1 F (36.7 C), temperature source Oral, resp. rate 18, height 5\' 7"  (1.702 m), weight 56.6 kg, SpO2 99 %. Physical Exam Gen: no distress, normal appearing HEENT: oral mucosa pink and moist, without teeth Cardio: Reg rate Chest: normal effort, normal rate of breathing Abd: soft, non-distended Ext: no edema Psych: pleasant, bilateral mittens in place Skin: intact Neurological:     Comments: Patient is alert.  Bilateral mittens in place.  She does make eye contact with examiner.  She is easily distracted.  Speech is of low tone but does provide her name and place. Appears to have 4/5 strength throughout; inconsistent in following commands.    Results for orders placed or performed during the hospital encounter of 08/23/20 (from the past 48 hour(s))  CBC     Status: Abnormal   Collection Time: 08/30/20  2:45 AM  Result Value Ref Range   WBC 4.5 4.0 - 10.5 K/uL   RBC 3.59 (L) 3.87 - 5.11 MIL/uL   Hemoglobin 11.2 (L) 12.0 - 15.0 g/dL   HCT 09/01/20 (L) 51.7 - 61.6 %   MCV 91.4 80.0 - 100.0 fL   MCH 31.2 26.0 - 34.0 pg   MCHC 34.1 30.0 - 36.0 g/dL   RDW 07.3 71.0 - 62.6 %   Platelets 381 150 - 400 K/uL   nRBC 0.0 0.0 - 0.2 %    Comment: Performed at [facility], [address] [place], [place]  Comprehensive metabolic panel     Status: Abnormal   Collection Time: 08/30/20  2:45 AM  Result Value Ref Range   Sodium 135 135 - 145 mmol/L   Potassium 3.5 3.5 - 5.1 mmol/L   Chloride 105 98 - 111 mmol/L   CO2 23 22 - 32 mmol/L   Glucose, Bld 105 (H) 70 - 99 mg/dL    Comment: Glucose reference range applies only to samples taken after fasting for at least 8 hours.   BUN <5 (L) 8 - 23 mg/dL   Creatinine, Ser 09/01/20 0.44 - 1.00 mg/dL   Calcium 9.2 8.9 - 0.35 mg/dL   Total Protein 6.5 6.5 - 8.1 g/dL   Albumin 2.7 (L) 3.5 - 5.0 g/dL    AST 21 15 - 41 U/L   ALT 13 0 - 44 U/L   Alkaline Phosphatase 51 38 - 126 U/L   Total Bilirubin 0.5 0.3 - 1.2 mg/dL   GFR, Estimated 00.9 >38 mL/min    Comment: (NOTE) Calculated using the CKD-EPI Creatinine Equation (2021)    Anion gap 7 5 - 15    Comment: Performed at [facility], [address], [place], [place]   Overnight EEG with video  Result Date: 08/30/2020 09/01/2020, MD     08/30/2020  8:59 AM Patient Name:Ambriel LINDELL TUSSEY Darrin Luis Epilepsy Attending:Priyanka JIR:[id number] Referring Physician/Provider:Dr [name] Duration:4/13/20221531 to 4/14/20220900  Patient history:59 yo F w possible seizure activity with left gaze deviation and shaking.EEG to evaluate for seizure.  Level of alertness:Awake, asleep  AEDs during EEG study:LEV, LCM  Technical aspects: This EEG study was done with scalp electrodes positioned according to the 10-20 International system of electrode placement. Electrical activity was acquired at a sampling rate of 500Hz  and reviewed with a high frequency filter of  70Hz  and a low frequency filter of 1Hz . EEG data were recorded continuously and digitally stored.  Description: The posterior dominant rhythm consists of8Hz  activity of moderate voltage (25-35 uV) seen predominantly in posterior head regions, symmetricand reactive to eye opening and eye closing. Sleep was characterized by vertex waves, sleep spindles (12 to 14 Hz), maximal frontocentral region. EEG showed intermittentgeneralized 3 to 6 Hz theta-delta slowing.Hyperventilation and photic stimulation were not performed.   ABNORMALITY -Intermittentslow, generalized IMPRESSION: This study is suggestive of mild to moderate diffuse encephalopathy, nonspecific etiology.No seizures ordefiniteepileptiform discharges were seen throughout the recording.   Charlsie QuestPriyanka O Yadav      Medical Problem List and Plan: 1.  Right side weakness with decreased functional  mobility secondary to delirium and acute metabolic encephalopathy due to status epilepticus  -patient may shower  -ELOS/Goals: modA 2-3 weeks  -Admit to CIR 2.  Impaired mobility -DVT/anticoagulation: Continue Lovenox  -antiplatelet therapy: Aspirin 325 mg 3. Pain Management: Tylenol as needed 4. Mood: Provide emotional support  -antipsychotic agents: Seroquel 50 mg nightly as well as 50 mg twice daily as needed agitation. Continue bilateral mittens as needed 5. Neuropsych: This patient is capable of making decisions on her own behalf. 6. Skin/Wound Care: Routine skin checks 7. Fluids/Electrolytes/Nutrition: Routine in and outs with follow-up chemistries 8.  Seizure disorder.  Vimpat 200 mg twice daily, valproic acid 1000 mg twice daily.  EEG negative 9.  Hypertension.  Norvasc 5 mg daily, Toprol-XL 50 mg daily.  Monitor with increased mobility 10.  History of bilateral cerebral convexity infarction frontal region.  CIR 08/19/2019-08/27/2019 11.  Hyperlipidemia.  Continue Lipitor 12.  History of polysubstance abuse.  Urine drug screen positive cocaine.  Counseling 13. AKI: creatinine normal on 4/14, monitor as needed.  14. UTI: UA positive preadmission 4/11 with UC with multiple species- will order repeat.   I have personally performed a face to face diagnostic evaluation, including, but not limited to relevant history and physical exam findings, of this patient and developed relevant assessment and plan.  Additionally, I have reviewed and concur with the physician assistant's documentation above.  Sula SodaKrutika Maximina Pirozzi, MD  Mcarthur Rossettianiel J Angiulli, PA-C 08/31/2020

## 2020-08-31 NOTE — Progress Notes (Signed)
IP rehab admissions - I have medical clearance for acute inpatient rehab admission for today.  Bed available and will admit to CIR today.  Call for questions.  [phone]

## 2020-08-31 NOTE — Progress Notes (Signed)
Subjective: No acute events overnight.  Per RN, patient has continued to be intermittently agitated and not consistently following commands.  ROS: Unable to obtain due to poor mental status  Examination  Vital signs in last 24 hours: Temp:  [97.9 F (36.6 C)-98.8 F (37.1 C)] 98.6 F (37 C) (04/15 1150) Pulse Rate:  [88-106] 97 (04/15 1150) Resp:  [16-19] 19 (04/15 1150) BP: (122-151)/(80-103) 145/103 (04/15 1150) SpO2:  [97 %-98 %] 97 % (04/15 1150)  General: lying in bed, not in apparent distress CVS: pulse-normal rate and rhythm RS: breathing comfortably Extremities: normal, warm Neuro: Awake, alert, looks at examiner but does not consistently follow commands, mumbles something but difficult to understand, cranial nerves appear intact, right-sided hemiparesis appears to be improving, constantly moving in bed  Basic Metabolic Panel: Recent Labs  Lab 08/25/20 0931 08/26/20 0247 08/27/20 1102 08/27/20 1426 08/28/20 0444 08/30/20 0245  NA 135 135  --  132* 134* 135  K 3.1* 3.1*  --  2.7* 3.9 3.5  CL 106 104  --  103 106 105  CO2 21* 22  --  20* 22 23  GLUCOSE 105* 106*  --  131* 135* 105*  BUN 8 6*  --  8 <5* <5*  CREATININE 0.87 0.92  --  0.84 0.81 0.94  CALCIUM 8.7* 8.7*  --  8.6* 8.8* 9.2  MG  --   --  1.6*  --  1.9  --   PHOS  --   --  2.9  --  2.7  --     CBC: Recent Labs  Lab 08/25/20 0931 08/27/20 1102 08/28/20 0444 08/30/20 0245  WBC 9.2 11.3* 9.0 4.5  NEUTROABS 7.2 8.9*  --   --   HGB 13.2 12.6 11.5* 11.2*  HCT 38.9 36.5 33.1* 32.8*  MCV 93.7 89.9 91.2 91.4  PLT 304 312 316 381     Coagulation Studies: No results for input(s): LABPROT, INR in the last 72 hours.  Imaging No new imaging overnight  ASSESSMENT AND PLAN: 60 year old female who initially presented with status epilepticus in the setting of cocaine use but has continued to have right-sided weakness which is gradually improved but patient continues to have waxing and waning mental  status.  Acute encephalopathy Seizure Right hemiparesis (improving) - LTM EEG: No seizures overnight   -Waxing and waning encephalopathy is likely multifactorial due to sundowning, iatrogenic secondary to medication use overnight (patient has been given Haldol, Ativan), substance use disorder, prolonged postictal state  Recommendations - Continue Vimpat 200 mg twice daily and Depakote 1000mg  BID -Patient likely had poor neurological reserve and will have prolonged recovery due to status epilepticus, substance use. -Continue rehab including PT/OT/speech therapy - Continue seizure precautions - IV ativan 2mg  for clinical seizures lasting over 5 mins -Management of rest of comorbidities per primary team -Follow-up with neurology in 8 to 10 weeks after discharge  I have spent a total of25 minuteswith the patient reviewing hospitalnotes,  test results, labs and examining the patient as well as establishing an assessment and plan.>50% of time was spent in direct patient care.   Epilepsy [facility] For questions after 5pm please refer to AMION to reach the Neurologist on call

## 2020-08-31 NOTE — Progress Notes (Signed)
Occupational Therapy Treatment Patient Details Name: Cindy [name] MRN: [id number] DOB: 1961-04-12 Today's Date: 08/31/2020    History of present illness 60 yo female admitted with AMS .Pt with HTN and polysubstance abuse on arrival.  Current workup with L hemisphere seizure PMH HTN HLD CVA seizures Polysubstance   OT comments  Pt see in conjunction with PT to maximize pts activity tolerance and optimize pts participation. Pt continue to present with with impaired balance, cognitive deficits, decreased activity tolerance, generalized deconditioning and generalized weakness. Pt currently requries total A +2 for all aspects of bed mobiltiy, MAX A +2 for sit<>stand from EOB, and MOD- MAX A for static sitting balance with pt present with poor trunk and cerival control. Pt making no efforts to verbalize during session and follows one step commands intermittently. Current plan is to DC to CIR although based on todays session unsure if pt can tolerate intensity of CIR level therapy, may consider letting pt stay through weekend and re-evaluating Monday? Will follow acutely per POC and update DC recs as pt progresses.   Follow Up Recommendations  CIR    Equipment Recommendations  Other (comment) (Defer to next venue)    Recommendations for Other Services      Precautions / Restrictions Precautions Precautions: Fall Precaution Comments: bilateral mitts Restrictions Weight Bearing Restrictions: No       Mobility Bed Mobility Overal bed mobility: Needs Assistance Bed Mobility: Supine to Sit;Sit to Supine     Supine to sit: Total assist;+2 for physical assistance;HOB elevated Sit to supine: Total assist;+2 for physical assistance;HOB elevated   General bed mobility comments: pt making minimal efforts to assist with bed mobility, pt reaching across to therapists with LUE when transitioning into sitting but otherwise required total A +2 to transfer from supine<>sitting    Transfers Overall  transfer level: Needs assistance Equipment used: None Transfers: Sit to/from Stand Sit to Stand: Max assist;+2 physical assistance         General transfer comment: pt completed x2 sit<>stands from EOB with MAX A +2, pt needing assist to block BLEs and cues to elevate trunk into upright position, poor posture and trunk control    Balance Overall balance assessment: Needs assistance Sitting-balance support: Feet supported;Bilateral upper extremity supported Sitting balance-Leahy Scale: Poor Sitting balance - Comments: MOD- MAX A for static sitting balance with pt losing balance in all directions   Standing balance support: Bilateral upper extremity supported Standing balance-Leahy Scale: Poor Standing balance comment: needed external support                           ADL either performed or assessed with clinical judgement   ADL Overall ADL's : Needs assistance/impaired                         Toilet Transfer: Maximal assistance;+2 for physical assistance Toilet Transfer Details (indicate cue type and reason): sit<>stand only from EOB         Functional mobility during ADLs: Maximal assistance;+2 for physical assistance (sit<>stand only) General ADL Comments: pt continues to present with impaired balance, cognitive deficits, decreased activity tolerance, generalized deconditioning and generalized weakness     Vision Baseline Vision/History: Wears glasses Wears Glasses: At all times     Perception     Praxis      Cognition Arousal/Alertness: Awake/alert;Lethargic (pt alert during session but once pt returned to supine pt immediately lethargic only responding to  pain) Behavior During Therapy: Flat affect Overall Cognitive Status: Difficult to assess Area of Impairment: Attention;Following commands;Safety/judgement;Awareness                   Current Attention Level: Focused   Following Commands: Follows one step commands  inconsistently Safety/Judgement: Decreased awareness of deficits;Decreased awareness of safety Awareness: Intellectual   General Comments: pt making no efforts to verbalize this session, pt following one step commands inconsistently and noted to become lethargic once returend to supine        Exercises Other Exercises Other Exercises: pt requried assist to keep neck in neutral positon while sitting EOB, poor cervical control   Shoulder Instructions       General Comments VSS on RA    Pertinent Vitals/ Pain       Pain Assessment: Faces Faces Pain Scale: No hurt  Home Living                                          Prior Functioning/Environment              Frequency  Min 2X/week        Progress Toward Goals  OT Goals(current goals can now be found in the care plan section)  Progress towards OT goals: Progressing toward goals  Acute Rehab OT Goals Patient Stated Goal: none stated OT Goal Formulation: Patient unable to participate in goal setting Time For Goal Achievement: 09/09/20 Potential to Achieve Goals: Fair  Plan Discharge plan remains appropriate;Frequency remains appropriate    Co-evaluation      Reason for Co-Treatment: For patient/therapist safety;To address functional/ADL transfers   OT goals addressed during session: ADL's and self-care      AM-PAC OT "6 Clicks" Daily Activity     Outcome Measure   Help from another person eating meals?: A Lot Help from another person taking care of personal grooming?: A Lot Help from another person toileting, which includes using toliet, bedpan, or urinal?: Total Help from another person bathing (including washing, rinsing, drying)?: Total Help from another person to put on and taking off regular upper body clothing?: Total Help from another person to put on and taking off regular lower body clothing?: Total 6 Click Score: 8    End of Session Equipment Utilized During Treatment: Gait  belt  OT Visit Diagnosis: Unsteadiness on feet (R26.81);Muscle weakness (generalized) (M62.81);Other symptoms and signs involving the nervous system (R29.898);Other symptoms and signs involving cognitive function   Activity Tolerance Patient tolerated treatment well   Patient Left in bed;with call bell/phone within reach;with bed alarm set   Nurse Communication Mobility status        Time: 1253-1316 OT Time Calculation (min): 23 min  Charges: OT General Charges $OT Visit: 1 Visit OT Treatments $Therapeutic Activity: 8-22 mins  Lenor Derrick., COTA/L Acute Rehabilitation Services [phone]    Barron Schmid 08/31/2020, 2:55 PM

## 2020-08-31 NOTE — Procedures (Signed)
Patient Name:Cindy [name] GBE:[id number] Epilepsy Attending:Bergen Melle [name] Referring Physician/Provider:Dr Ritta Slot Duration:4/14/20221531 to 4/15/20220919  Patient history:59 yo F w possible seizure activity with left gaze deviation and shaking.EEG to evaluate for seizure.  Level of alertness:Awake, asleep  AEDs during EEG study:LEV, LCM  Technical aspects: This EEG study was done with scalp electrodes positioned according to the 10-20 International system of electrode placement. Electrical activity was acquired at a sampling rate of 500Hz  and reviewed with a high frequency filter of 70Hz  and a low frequency filter of 1Hz . EEG data were recorded continuously and digitally stored.   Description: The posterior dominant rhythm consists of8Hz  activity of moderate voltage (25-35 uV) seen predominantly in posterior head regions, symmetricand reactive to eye opening and eye closing. Sleep was characterized by vertex waves, sleep spindles (12 to 14 Hz), maximal frontocentral region. EEG showed continuousgeneralized 3 to 6 Hz theta-delta slowing.Hyperventilation and photic stimulation were not performed.   ABNORMALITY -Continuousslow, generalized  IMPRESSION: This study is suggestive of mild to moderate diffuse encephalopathy, nonspecific etiology.No seizures ordefiniteepileptiform discharges were seen throughout the recording.   [name] 

## 2020-08-31 NOTE — Progress Notes (Signed)
SLP Cancellation Note  Patient Details Name: Cindy [name] MRN: [id number] DOB: [birth year]   Cancelled treatment:       Reason Eval/Treat Not Completed: Other (comment) (pt needing to be cleaned up, informed Diplomatic Services operational officer, will continue efforts) Rolena Infante, MS [facility] SLP Acute Rehab Services Office [phone] Pager [phone]    Chales Abrahams 08/31/2020, 9:25 AM

## 2020-08-31 NOTE — H&P (Signed)
Physical Medicine and Rehabilitation Admission H&P  CC: Metabolic encephalopathy  HPI: Cindy [name] is a 60 year old right-handed female history of hypertension, hyperlipidemia, bilateral cerebral convexity infarct frontal region receiving CIR 08/19/2019-08/27/2019 and maintained on Plavix, 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  Patient did have admissions 07/2019 for seizure as well as 01/2020.  Per chart review independent with assistive device prior to admission living with her spouse.  She does have supportive family in the area.  Presented 08/23/2020 with altered mental status/slurred speech, right-sided weakness as well as reports of shaking and jerking episodes lasting 5 to 10 minutes.  Cranial CT/MRI scan negative for acute changes but did show remote lacunar infarct in the bilateral centrum semiovale, corona radiata and right thalamus.  CT angiogram head and neck no large vessel occlusion or significant stenosis.  CT cervical spine no acute cervical spine fracture.  Admission chemistries alcohol negative, WBC 11,800, glucose 127, creatinine 1.21, lactic acid 2.0, urine drug screen positive cocaine, CK 1002, ammonia 13.  EEG suggestive of cortical dysfunction arising from left hemisphere likely secondary to underlying structural abnormality post ictal state no seizure or definite epileptiform discharges.  Neurology follow-up currently maintained on Vimpat 200 mg twice daily as well as valproic acid 1000 mg twice daily.  Patient with intermittent bouts of agitation restlessness maintained on Seroquel.  Subcutaneous Lovenox for DVT prophylaxis.  Urine culture 40,000 multiple species completing course of empiric Keflex.  Follow-up CK improved 534.  Tolerating mechanical soft diet.  Therapy evaluations completed due to patient decreased functional mobility was admitted for a comprehensive rehab program. She is currently in bilateral  mittens.   Review of Systems  Constitutional: Negative for chills and fever.  HENT: Negative for hearing loss.   Eyes: Negative for blurred vision and double vision.  Respiratory: Negative for shortness of breath.   Cardiovascular: Negative for chest pain, palpitations and leg swelling.  Gastrointestinal: Positive for constipation. Negative for heartburn, nausea and vomiting.  Genitourinary: Negative for dysuria, flank pain and hematuria.  Musculoskeletal: Positive for joint pain and myalgias.  Skin: Negative for rash.  Neurological: Positive for seizures and headaches.  All other systems reviewed and are negative.  Past Medical History:  Diagnosis Date  . Abnormal LFTs    Hx of  . CVA (cerebral vascular accident) (HCC) 2005  . Hyperlipidemia   . Hypertension   . Polysubstance dependence including opioid type drug, episodic abuse (HCC)    cocaine/etoh    Past Surgical History:  Procedure Laterality Date  . ESOPHAGOGASTRODUODENOSCOPY N/A 08/16/2019   Procedure: ESOPHAGOGASTRODUODENOSCOPY (EGD);  Surgeon: Vida RiggerMagod, Marc, MD;  Location: [facility] ENDOSCOPY;  Service: Endoscopy;  Laterality: N/A;   Family History  Problem Relation Age of Onset  . Cancer Father        prostate   . Hypertension Mother   . Asthma Mother   . Bronchitis Mother    Social History:  reports that she has never smoked. She has never used smokeless tobacco. She reports current alcohol use. She reports that she does not use drugs. Allergies: No Known Allergies Medications Prior to Admission  Medication Sig Dispense Refill  . amitriptyline (ELAVIL) 25 MG tablet Take 25 mg by mouth at bedtime.    Marland Kitchen. amLODipine (NORVASC) 10 MG tablet TAKE 1 TABLET BY MOUTH EVERY DAY (NEEDS TO MAKE AN APPOINTMENT) (Patient taking differently: Take 10 mg by mouth daily. (NEEDS TO MAKE AN APPOINTMENT)) 30 tablet  1  . atorvastatin (LIPITOR) 40 MG tablet Take 1 tablet (40 mg total) by mouth at bedtime. 30 tablet 1  . cephALEXin (KEFLEX) 500  MG capsule Take 1 capsule (500 mg total) by mouth every 8 (eight) hours for 2 days. 6 capsule 0  . clopidogrel (PLAVIX) 75 MG tablet Take 75 mg by mouth daily.    . divalproex (DEPAKOTE) 250 MG DR tablet Take 1 tablet (250 mg total) by mouth 2 (two) times daily. 60 tablet 0  . folic acid (FOLVITE) 1 MG tablet Take 1 tablet (1 mg total) by mouth daily. 30 tablet 1  . lacosamide (VIMPAT) 200 MG TABS tablet Take 1 tablet (200 mg total) by mouth 2 (two) times daily. (Patient not taking: Reported on 08/23/2020) 60 tablet 1  . metoprolol tartrate (LOPRESSOR) 25 MG tablet Take 1 tablet (25 mg total) by mouth 2 (two) times daily. 60 tablet 1  . Multiple Vitamin (MULTIVITAMIN WITH MINERALS) TABS tablet Take 1 tablet by mouth daily.    . pantoprazole (PROTONIX) 40 MG tablet Take 1 tablet (40 mg total) by mouth daily. 30 tablet 1  . potassium chloride SA (KLOR-CON) 20 MEQ tablet Take 2 tablets (40 mEq total) by mouth 2 (two) times daily for 2 days. 8 tablet 0  . QUEtiapine (SEROQUEL) 50 MG tablet Take 1 tablet (50 mg total) by mouth at bedtime. 30 tablet 0  . thiamine 100 MG tablet Take 1 tablet (100 mg total) by mouth daily. 30 tablet 1    Drug Regimen Review Drug regimen was reviewed and remains appropriate with no significant issues identified  Home: Home Living Family/patient expects to be discharged to:: Unsure Additional Comments: Pt poor historian - family not immediately available for dispo planning   Functional History: Prior Function Level of Independence: Independent with assistive device(s),Needs assistance (History obtained from prior admission) Gait / Transfers Assistance Needed: household ambulator using RW (Hx obtained from prior admission) ADL's / Homemaking Assistance Needed: assisted by family (Hx obtained from prior admission) Comments: Hx obtained from prior admission - pt unable to report at this time  Functional Status:  Mobility: Bed Mobility Overal bed mobility: Needs  Assistance Bed Mobility: Rolling,Supine to Sit,Sit to Supine Rolling: Max assist Supine to sit: Max assist,+2 for safety/equipment,+2 for physical assistance Sit to supine: Max assist,+2 for physical assistance,+2 for safety/equipment General bed mobility comments: Patient able to initiate trunk elevation but overall maxA+2 to complete bed mobility. Upon sitting EOB, able to maintain sitting balance with min guard, however severe trunk and neck flexion and inability to keep upright posture without maxA Transfers Overall transfer level: Needs assistance Equipment used: Rolling walker (2 wheeled) Transfers: Sit to/from [employer] Sit to Stand: Mod assist,+2 physical assistance,+2 safety/equipment Stand pivot transfers: Mod assist,+2 physical assistance,+2 safety/equipment General transfer comment: deferred due to lethargy  ADL Overall ADL's : Needs assistance/impaired Eating/Feeding: Set up,Bed level Eating/Feeding Details (indicate cue type and reason): Patient able to drink from a cup with a straw. Initially therapist controlled the bup but on second trial patient able to hold cup and bring straw to mouth. Patient able to eat half a cookie. Grooming: Wash/dry face,Bed level,Set up Grooming Details (indicate cue type and reason): Patient able to wash face with setup with fairly good quality. General ADL Comments: requires (A) for all adls.  Cognition: Cognition Overall Cognitive Status: Difficult to assess Orientation Level: Oriented to person Cognition Arousal/Alertness: Lethargic,Suspect due to medications (awake at end of session) Behavior During Therapy: Restless,Impulsive  Overall Cognitive Status: Difficult to assess Area of Impairment: Awareness,Safety/judgement,Following commands,Memory,Attention,Orientation Orientation Level: Person,Place Current Attention Level: Sustained Memory: Decreased short-term memory Following Commands: Follows one step commands  inconsistently Safety/Judgement: Decreased awareness of deficits,Decreased awareness of safety Awareness: Emergent General Comments: No verbalizations this session. Difficult to assess cognition due to impaired communication and lethargy Difficult to assess due to: Impaired communication   Physical Exam: There were no vitals taken for this visit. Physical Exam Gen: no distress, normal appearing HEENT: oral mucosa pink and moist, without teeth Cardio: Reg rate Chest: normal effort, normal rate of breathing Abd: soft, non-distended Ext: no edema Psych: pleasant, bilateral mittens in place Skin: intact Neurological:     Comments: Patient is alert.  Bilateral mittens in place.  She does make eye contact with examiner.  She is easily distracted.  Speech is of low tone but does provide her name and place. Appears to have 4/5 strength throughout; inconsistent in following commands.    Results for orders placed or performed during the hospital encounter of 08/23/20 (from the past 48 hour(s))  CBC     Status: Abnormal   Collection Time: 08/30/20  2:45 AM  Result Value Ref Range   WBC 4.5 4.0 - 10.5 K/uL   RBC 3.59 (L) 3.87 - 5.11 MIL/uL   Hemoglobin 11.2 (L) 12.0 - 15.0 g/dL   HCT 81.1 (L) 91.4 - 78.2 %   MCV 91.4 80.0 - 100.0 fL   MCH 31.2 26.0 - 34.0 pg   MCHC 34.1 30.0 - 36.0 g/dL   RDW 95.6 21.3 - 08.6 %   Platelets 381 150 - 400 K/uL   nRBC 0.0 0.0 - 0.2 %    Comment: Performed at [facility], [address], [place], [place] [address]  Comprehensive metabolic panel     Status: Abnormal   Collection Time: 08/30/20  2:45 AM  Result Value Ref Range   Sodium 135 135 - 145 mmol/L   Potassium 3.5 3.5 - 5.1 mmol/L   Chloride 105 98 - 111 mmol/L   CO2 23 22 - 32 mmol/L   Glucose, Bld 105 (H) 70 - 99 mg/dL    Comment: Glucose reference range applies only to samples taken after fasting for at least 8 hours.   BUN <5 (L) 8 - 23 mg/dL   Creatinine, Ser 9.62 0.44 - 1.00 mg/dL    Calcium 9.2 8.9 - 95.2 mg/dL   Total Protein 6.5 6.5 - 8.1 g/dL   Albumin 2.7 (L) 3.5 - 5.0 g/dL   AST 21 15 - 41 U/L   ALT 13 0 - 44 U/L   Alkaline Phosphatase 51 38 - 126 U/L   Total Bilirubin 0.5 0.3 - 1.2 mg/dL   GFR, Estimated >84 >13 mL/min    Comment: (NOTE) Calculated using the CKD-EPI Creatinine Equation (2021)    Anion gap 7 5 - 15    Comment: Performed at [facility], [address], [place], [place] [address]   Overnight EEG with video  Result Date: 08/30/2020 Charlsie Quest, MD     08/31/2020  8:55 AM Patient Name:Cindy [name] Epilepsy Attending:Priyanka [name] Referring Physician/Provider:Dr Ritta Slot Duration:4/13/20221531 to 4/14/20221531  Patient history:59 yo F w possible seizure activity with left gaze deviation and shaking.EEG to evaluate for seizure.  Level of alertness:Awake, asleep  AEDs during EEG study:LEV, LCM  Technical aspects: This EEG study was done with scalp electrodes positioned according to the 10-20 International system of electrode  placement. Electrical activity was acquired at a sampling rate of 500Hz  and reviewed with a high frequency filter of 70Hz  and a low frequency filter of 1Hz . EEG data were recorded continuously and digitally stored.  Description: The posterior dominant rhythm consists of8Hz  activity of moderate voltage (25-35 uV) seen predominantly in posterior head regions, symmetricand reactive to eye opening and eye closing. Sleep was characterized by vertex waves, sleep spindles (12 to 14 Hz), maximal frontocentral region. EEG showed continuousgeneralized 3 to 6 Hz theta-delta slowing.Hyperventilation and photic stimulation were not performed.   ABNORMALITY - Continuous slow, generalized IMPRESSION: This study is suggestive of mild to moderate diffuse encephalopathy, nonspecific etiology.No seizures ordefiniteepileptiform discharges were seen throughout the recording.    Medical Problem List and Plan: 1.  Right side weakness with decreased functional mobility secondary to delirium and acute metabolic encephalopathy due to status epilepticus  -patient may shower  -ELOS/Goals: modA 2-3 weeks  -Admit to CIR 2.  Impaired mobility -DVT/anticoagulation: Continue Lovenox  -antiplatelet therapy: Aspirin 325 mg 3. Pain Management: Tylenol as needed 4. Mood: Provide emotional support  -antipsychotic agents: Seroquel 50 mg nightly as well as 50 mg twice daily as needed agitation. Continue bilateral mittens as needed 5. Neuropsych: This patient is capable of making decisions on her own behalf. 6. Skin/Wound Care: Routine skin checks 7. Fluids/Electrolytes/Nutrition: Routine in and outs with follow-up chemistries 8.  Seizure disorder.  Vimpat 200 mg twice daily, valproic acid 1000 mg twice daily.  EEG negative 9.  Hypertension.  Norvasc 5 mg daily, Toprol-XL 50 mg daily.  Monitor with increased mobility 10.  History of bilateral cerebral convexity infarction frontal region.  CIR 08/19/2019-08/27/2019 11.  Hyperlipidemia.  Continue Lipitor 12.  History of polysubstance abuse.  Urine drug screen positive cocaine.  Counseling 13. AKI: creatinine normal on 4/14, monitor as needed.  14. UTI: UA positive preadmission 4/11 with UC with multiple species- will order repeat.   I have personally performed a face to face diagnostic evaluation, including, but not limited to relevant history and physical exam findings, of this patient and developed relevant assessment and plan.  Additionally, I have reviewed and concur with the physician assistant's documentation above.  10/27/2019, MD 5/14 [place], PA-C 08/31/2020

## 2020-08-31 NOTE — Progress Notes (Signed)
Izora Ribas, MD  Physician  Physical Medicine and Rehabilitation  PMR Pre-admission     Signed  Date of Service:  08/27/2020 1:30 PM      Related encounter: ED to Hosp-Admission (Current) from 08/23/2020 in [place] [facility]       Signed          Show:Clear all [x] Manual[x] Template[x] Copied  Added by: [x] Karl Bales, Evalee Mutton, RN[x] Raulkar, Clide Deutscher, MD[x] Genella Mech, CCC-SLP   [] Hover for details  PMR Admission Coordinator Pre-Admission Assessment  Patient: Cindy [name] is an 60 y.o., female MRN: [id number] DOB: March 06, 1961 Height: 5' 7"  (170.2 cm) Weight: 56.6 kg  Insurance Information HMO:     PPO:      PCP:      IPA:      80/20:      OTHER:  PRIMARY: Medicaid of [place]    Policy#: [id number]      Subscriber: Pt CM Name:            Phone#:     Fax#:  Pre-Cert#:       Employer: Not employed Benefits:  Phone #:  [phone]     Name: Verified in [employer]. Date: Eligible 08/30/20 with coverage code MADMN     Deduct:        Out of Pocket Max:        Life Max:   CIR:        SNF:   Outpatient:       Co-Pay:   Home Health:        Co-Pay:   DME:       Co-Pay:   Providers:    SECONDARY:      Policy#:      Phone#:   Development worker, community:       Phone#:   [employer], [employer]" for patients in Inpatient Rehabilitation Facilities with attached "Privacy Act [place] Records" was provided and verbally reviewed with: Family  Emergency Contact Information         Contact Information    Name Relation Home Work Mobile   [place] Spouse [phone]  [phone]   Chrisann, Melaragno [phone]  [phone]      Current Medical History  Patient Admitting Diagnosis: Acute metabolic encephalopathy 2/2 seizure  History of Present Illness: A 60 year old right-handed female history of hypertension, hyperlipidemia, bilateral cerebral convexity infarct frontal region receiving CIR 08/19/2019-08/27/2019 and  maintained on Plavix, 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 Patient did have admissions 07/2019 for seizure as well as 01/2020. Per chart review independent with assistive device prior to admission living with her spouse. She does have supportive family in the area. Presented 08/23/2020 with altered mental status/slurred speech, right-sided weakness as well as reports of shaking and jerking episodes lasting 5 to 10 minutes. Cranial CT/MRI scan negative for acute changes but did show remote lacunar infarct in the bilateral centrum semiovale, corona radiata and right thalamus. CT angiogram head and neck no large vessel occlusion or significant stenosis. CT cervical spine no acute cervical spine fracture. Admission chemistries alcohol negative, WBC 11,800, glucose 127, creatinine 1.21, lactic acid 2.0, urine drug screen positive cocaine, CK 1002, ammonia 13. EEG suggestive of cortical dysfunction arising from left hemisphere likely secondary to underlying structural abnormality post ictal state no seizure or definite epileptiform discharges. Neurology follow-up currently maintained on Vimpat 200 mg twice daily as well as valproic acid 1000 mg twice daily.  Patient with intermittent bouts of agitation restlessness maintained on Seroquel. Subcutaneous Lovenox for DVT prophylaxis. Urine culture 40,000 multiple species completing course of empiric Keflex. Follow-up CK improved 534. Tolerating mechanical soft diet. Therapy evaluations completed due to patient decreased functional mobility was admitted for a comprehensive rehab program  Complete NIHSS TOTAL: 5  Patient's medical record from [facility] has been reviewed by the rehabilitation admission coordinator and physician.  Past Medical History      Past Medical History:  Diagnosis Date  . Abnormal LFTs    Hx of  . CVA (cerebral vascular accident)  ([place]) 2005  . Hyperlipidemia   . Hypertension   . Polysubstance dependence including opioid type drug, episodic abuse ([place])    cocaine/etoh     Family History   family history includes Asthma in her mother; Bronchitis in her mother; Cancer in her father; Hypertension in her mother.  Prior Rehab/Hospitalizations Has the patient had prior rehab or hospitalizations prior to admission? Yes  Has the patient had major surgery during 100 days prior to admission? No              Current Medications  Current Facility-Administered Medications:  .  acetaminophen (TYLENOL) tablet 650 mg, 650 mg, Oral, Q6H PRN, Rosezella Rumpf, NP, 650 mg at 08/26/20 2018 .  amLODipine (NORVASC) tablet 5 mg, 5 mg, Oral, Daily, Danford, Suann Larry, MD, 5 mg at 08/31/20 1132 .  aspirin tablet 325 mg, 325 mg, Oral, Daily, Hosie Poisson, MD, 325 mg at 08/31/20 1132 .  atorvastatin (LIPITOR) tablet 40 mg, 40 mg, Oral, QHS, Danford, Christopher P, MD .  cephALEXin (KEFLEX) capsule 500 mg, 500 mg, Oral, Q8H, Danford, Suann Larry, MD, 500 mg at 08/31/20 0556 .  enoxaparin (LOVENOX) injection 40 mg, 40 mg, Subcutaneous, Q24H, Smith, Rondell A, MD, 40 mg at 08/30/20 1740 .  feeding supplement (ENSURE ENLIVE / ENSURE PLUS) liquid 237 mL, 237 mL, Oral, TID with meals, Hosie Poisson, MD, 237 mL at 08/31/20 1134 .  folic acid (FOLVITE) tablet 1 mg, 1 mg, Oral, Daily, Smith, Rondell A, MD, 1 mg at 08/31/20 1133 .  hydrALAZINE (APRESOLINE) injection 10 mg, 10 mg, Intravenous, Q4H PRN, Smith, Rondell A, MD .  lacosamide (VIMPAT) tablet 200 mg, 200 mg, Oral, BID, Lora Havens, MD, 200 mg at 08/31/20 1133 .  metoprolol succinate (TOPROL-XL) 24 hr tablet 50 mg, 50 mg, Oral, Daily, Danford, Suann Larry, MD, 50 mg at 08/31/20 1133 .  multivitamin with minerals tablet 1 tablet, 1 tablet, Oral, Daily, Fuller Plan A, MD, 1 tablet at 08/31/20 1132 .  pantoprazole (PROTONIX) EC tablet 40 mg, 40 mg, Oral, Daily,  Danford, Suann Larry, MD, 40 mg at 08/31/20 1133 .  potassium chloride SA (KLOR-CON) CR tablet 40 mEq, 40 mEq, Oral, BID, Hosie Poisson, MD, 40 mEq at 08/31/20 1133 .  QUEtiapine (SEROQUEL) tablet 50 mg, 50 mg, Oral, QHS, Danford, Suann Larry, MD, 50 mg at 08/30/20 2100 .  QUEtiapine (SEROQUEL) tablet 50 mg, 50 mg, Oral, BID PRN, Edwin Dada, MD, 50 mg at 08/31/20 1132 .  thiamine tablet 100 mg, 100 mg, Oral, Daily, 100 mg at 08/31/20 1132 **OR** [DISCONTINUED] thiamine (B-1) injection 100 mg, 100 mg, Intravenous, Daily, Tamala Julian, Rondell A, MD, 100 mg at 08/29/20 0909 .  valproic acid (DEPAKENE) 250 MG/5ML solution 1,000 mg, 1,000 mg, Oral, BID, Lora Havens, MD, 1,000 mg at 08/31/20 1131  Patients Current Diet:     Diet Order  Diet - low sodium heart healthy            DIET SOFT Room service appropriate? Yes; Fluid consistency: Thin  Diet effective now                  Precautions / Restrictions Precautions Precautions: Fall Precaution Comments: [place]ens Restrictions Weight Bearing Restrictions: No   Has the patient had 2 or more falls or a fall with injury in the past year? Yes  Prior Activity Level Limited Community (1-2x/wk): Went out mostly for appointments since CVA in 2021  Prior Functional Level Self Care: Did the patient need help bathing, dressing, using the toilet or eating? Needed some help  Indoor Mobility: Did the patient need assistance with walking from room to room (with or without device)? Needed some help  Stairs: Did the patient need assistance with internal or external stairs (with or without device)? Needed some help  Functional Cognition: Did the patient need help planning regular tasks such as shopping or remembering to take medications? Needed some help  Home Assistive Devices / Equipment  Prior Device Use: Indicate devices/aids used by the patient prior to current illness, exacerbation or injury?  Walker  Current Functional Level Cognition  Overall Cognitive Status: Difficult to assess Difficult to assess due to: Impaired communication Current Attention Level: Sustained Orientation Level: Oriented to person Following Commands: Follows one step commands inconsistently Safety/Judgement: Decreased awareness of deficits,Decreased awareness of safety General Comments: No verbalizations this session. Difficult to assess cognition due to impaired communication and lethargy    Extremity Assessment (includes Sensation/Coordination)  Upper Extremity Assessment: RUE deficits/detail,LUE deficits/detail RUE Deficits / Details: active movement noted but unable to hold against gravity. pt with decrease sensation and awareness of arm in space RUE Sensation: decreased proprioception,decreased light touch RUE Coordination: decreased gross motor,decreased fine motor LUE Deficits / Details: pushing with L UE, not following commands with hand, pt does reach for rail with hand over hand placement, pt able to reach against gravity LUE Coordination: decreased fine motor,decreased gross motor  Lower Extremity Assessment: Defer to PT evaluation,RLE deficits/detail RLE Deficits / Details: decreased sensation compared to painful stimuli withdrawal to LLE    ADLs  Overall ADL's : Needs assistance/impaired Eating/Feeding: Set up,Bed level Eating/Feeding Details (indicate cue type and reason): Patient able to drink from a cup with a straw. Initially therapist controlled the bup but on second trial patient able to hold cup and bring straw to mouth. Patient able to eat half a cookie. Grooming: Wash/dry face,Bed level,Set up Grooming Details (indicate cue type and reason): Patient able to wash face with setup with fairly good quality. General ADL Comments: requires (A) for all adls.    Mobility  Overal bed mobility: Needs Assistance Bed Mobility: Rolling,Supine to Sit,Sit to Supine Rolling: Max  assist Supine to sit: Max assist,+2 for safety/equipment,+2 for physical assistance Sit to supine: Max assist,+2 for physical assistance,+2 for safety/equipment General bed mobility comments: Patient able to initiate trunk elevation but overall maxA+2 to complete bed mobility. Upon sitting EOB, able to maintain sitting balance with min guard, however severe trunk and neck flexion and inability to keep upright posture without maxA    Transfers  Overall transfer level: Needs assistance Equipment used: Rolling walker (2 wheeled) Transfers: Sit to/from [employer] Sit to Stand: Mod assist,+2 physical assistance,+2 safety/equipment Stand pivot transfers: Mod assist,+2 physical assistance,+2 safety/equipment General transfer comment: deferred due to lethargy    Ambulation / Gait / Stairs / Emergency planning/management officer  Posture / Balance Dynamic Sitting Balance Sitting balance - Comments: close min guard to sit edge of bed Balance Overall balance assessment: Needs assistance Sitting-balance support: Feet supported Sitting balance-Leahy Scale: Fair Sitting balance - Comments: close min guard to sit edge of bed Standing balance support: During functional activity Standing balance-Leahy Scale: Poor Standing balance comment: needed external support    Special needs/care consideration N/A   Previous Home Environment (from acute therapy documentation) Additional Comments: Pt poor historian - family not immediately available for dispo planning  Discharge Living Setting Plans for Discharge Living Setting: Patient's home Type of Home at Discharge: House Discharge Home Layout: One level Discharge Home Access: Ramped entrance Entrance Stairs-Rails: None Discharge Bathroom Shower/Tub: Walk-in shower Discharge Bathroom Toilet: Standard Discharge Bathroom Accessibility: Yes How Accessible: Accessible via walker  Social/Family/Support Systems Patient Roles: Spouse Contact  Information: [phone] Anticipated Caregiver: Norah Devin (husband) Anticipated Caregiver's Contact Information: [phone] Ability/Limitations of Caregiver: Can provide mod A Caregiver Availability: 24/7 Discharge Plan Discussed with Primary Caregiver: Yes Is Caregiver In Agreement with Plan?: Yes  Goals Patient/Family Goal for Rehab: PT/OT Min A, SLP mod A Expected length of stay: 14-18 days Pt/Family Agrees to Admission and willing to participate: Yes Program Orientation Provided & Reviewed with Pt/Caregiver Including Roles  & Responsibilities: Yes  Decrease burden of Care through IP rehab admission: N/A  Possible need for SNF placement upon discharge: Not anticipated  Patient Condition: I have reviewed medical records from [facility], spoken with CM, and patient and spouse. I met with patient at the bedside for inpatient rehabilitation assessment.  Patient will benefit from ongoing PT, OT and SLP, can actively participate in 3 hours of therapy a day 5 days of the week, and can make measurable gains during the admission.  Patient will also benefit from the coordinated team approach during an Inpatient Acute Rehabilitation admission.  The patient will receive intensive therapy as well as Rehabilitation physician, nursing, social worker, and care management interventions.  Due to bladder management, bowel management, safety, skin/wound care, disease management, medication administration, pain management and patient education the patient requires 24 hour a day rehabilitation nursing.  The patient is currently mod assist with mobility and basic ADLs.  Discharge setting and therapy post discharge at home with home health is anticipated.  Patient has agreed to participate in the Acute Inpatient Rehabilitation Program and will admit today.  Preadmission Screen Completed By:  Retta Diones, 08/31/2020 11:55  AM ______________________________________________________________________   Discussed status with Dr. Ranell Patrick and Dr. Naaman Plummer  on 08/31/20 at 0930 and received approval for admission today.  Admission Coordinator:  Retta Diones, RN, time 1154/Date 08/31/20   Assessment/Plan: Diagnosis: Enceophalopathy 1. Does the need for close, 24 hr/day Medical supervision in concert with the patient's rehab needs make it unreasonable for this patient to be served in a less intensive setting? Yes 2. Co-Morbidities requiring supervision/potential complications:  1. Seizures 2. Polysubstance abuse 3. Hypercholesterolemia 4. AKI 5. History of prior stroke 3. Due to bladder management, bowel management, safety, skin/wound care, disease management, medication administration, pain management and patient education, does the patient require 24 hr/day rehab nursing? Yes 4. Does the patient require coordinated care of a physician, rehab nurse, PT, OT, and SLP to address physical and functional deficits in the context of the above medical diagnosis(es)? Yes Addressing deficits in the following areas: balance, endurance, locomotion, strength, transferring, bowel/bladder control, bathing, dressing, feeding, grooming, toileting, cognition, speech, swallowing and psychosocial support 5. Can the patient  actively participate in an intensive therapy program of at least 3 hrs of therapy 5 days a week? Yes 6. The potential for patient to make measurable gains while on inpatient rehab is good 7. Anticipated functional outcomes upon discharge from inpatient rehab: mod assist PT, mod assist OT, mod assist SLP 8. Estimated rehab length of stay to reach the above functional goals is: 2-3 weeks 9. Anticipated discharge destination: Home 10. Overall Rehab/Functional Prognosis: good   MD Signature: Leeroy Cha, MD          Revision History                                            Note Details  Author  Izora Ribas, MD File Time 08/31/2020 12:00 PM  Author Type Physician Status Signed  Last Editor Izora Ribas, MD Service Physical Medicine and Rehabilitation

## 2020-08-31 NOTE — Progress Notes (Signed)
  Speech Language Pathology Treatment: Dysphagia  Patient Details Name: Cindy [name] MRN: [id number] DOB: 06/04/1960 Today's Date: 08/31/2020 Time: 1000-1013 SLP Time Calculation (min) (ACUTE ONLY): 13 min  Assessment / Plan / Recommendation Clinical Impression  Pt seen for a brief swallowing tx/diet check with soft consistency, puree and thin via straw with min verbal cues required for redirection d/t pt agitation/confusion during session as she was only oriented to self and exhibited questionable paranoid behavior stating "shut the back door and turn on the light" when SLP entered room and was attempting to take off her gown and crying.  SLP assisted with gown replacement and reassured her with success.  Pt able to consume above consistencies with decreased mastication noted only with soft solid requiring min cues to continue consuming, but no overt s/s of aspiration noted throughout session.  Pt's attention/impaired cognition affecting intake min rather than swallow function. Continue current diet of soft/thin liquids with intermittent supervision during meals/assistance with self-feeding.  Question inpatient admission d/t pt confusion/agitation observed this session.  ST will s/o for dysphagia goals at this time.     HPI HPI: Pt presents with acute metabolic encephalopathy secondary to suspected seizure with possible status epilepticus: Patient found to be acutely altered with intermittent shaking, urinary incontinence, and left gaze preference at home.  MRI did not show any acute signs of a infarct, chronic infarct consistent with 2021 finding. In 2021 pt fully verbal, primarily working on cognitive goals. progressed from dys1/nectar to mech soft thin that admission.  Patient placed on continuous EEG monitoring, but no seizure activity appreciated during testing.  Pt underwent BSE x2 this admission d/t dysphagia and placed on soft diet/thin liquids (finger foods).  Follow up today indicated to  determine po tolerance with current diet.  Marland Kitchen      SLP Plan  Discharge SLP treatment due to (comment) (goals met for dysphagia)       Recommendations  Diet recommendations: Thin liquid;Regular (soft) Liquids provided via: Cup;Straw Medication Administration: Whole meds with puree Supervision: Patient able to self feed;Staff to assist with self feeding;Intermittent supervision to cue for compensatory strategies Compensations: Slow rate;Small sips/bites                General recommendations: Other(comment) (question inpatient referral due to confusion/agitation this session; TBD) Oral Care Recommendations: Oral care BID Follow up Recommendations: Other (comment) (TBD d/t behavior this session) SLP Visit Diagnosis: Dysphagia, unspecified (R13.10) Plan: Discharge SLP treatment due to (comment) (goals met for dysphagia)                       Elvina Sidle, M.S., CCC-SLP 08/31/2020, 11:01 AM

## 2020-08-31 NOTE — Progress Notes (Signed)
Physical Therapy Treatment Patient Details Name: Cindy [name] MRN: [id number] DOB: 05-27-1960 Today's Date: 08/31/2020    History of Present Illness 60 yo female admitted with AMS .Pt with HTN and polysubstance abuse on arrival.  Current workup with L hemisphere seizure PMH HTN HLD CVA seizures Polysubstance    PT Comments    Patient alert upon arrival. Patient requires heavy assistance for bed mobility, sitting balance, and sit to stand transfer. Patient with no attempts at verbalizations throughout session. Upon return to supine, patient became lethargic with response to pain only. Unsure rehab potential at this time, however continue to recommend comprehensive inpatient rehab (CIR) for post-acute therapy needs when appropriate.    Follow Up Recommendations  CIR     Equipment Recommendations  Other (comment) (defer to post acute rehab)    Recommendations for Other Services       Precautions / Restrictions Precautions Precautions: Fall Precaution Comments: bilateral mitts Restrictions Weight Bearing Restrictions: No    Mobility  Bed Mobility Overal bed mobility: Needs Assistance Bed Mobility: Supine to Sit;Sit to Supine     Supine to sit: Total assist;+2 for physical assistance;HOB elevated Sit to supine: Total assist;+2 for physical assistance;HOB elevated   General bed mobility comments: pt making minimal efforts to assist with bed mobility, pt reaching across to therapists with LUE when transitioning into sitting but otherwise required total A +2 to transfer from supine<>sitting    Transfers Overall transfer level: Needs assistance Equipment used: None Transfers: Sit to/from Stand Sit to Stand: Max assist;+2 physical assistance         General transfer comment: pt completed x2 sit<>stands from EOB with MAX A +2, pt needing assist to block BLEs and cues to elevate trunk into upright position, poor posture and trunk control  Ambulation/Gait              General Gait Details: Unable to take steps in standing   Stairs             Wheelchair Mobility    Modified Rankin (Stroke Patients Only) Modified Rankin (Stroke Patients Only) Pre-Morbid Rankin Score: Slight disability Modified Rankin: Severe disability     Balance Overall balance assessment: Needs assistance Sitting-balance support: Feet supported;Bilateral upper extremity supported Sitting balance-Leahy Scale: Poor Sitting balance - Comments: MOD- MAX A for static sitting balance with pt losing balance in all directions   Standing balance support: Bilateral upper extremity supported Standing balance-Leahy Scale: Zero Standing balance comment: needed external support                            Cognition Arousal/Alertness: Awake/alert;Lethargic (pt alert during session but once pt returned to supine pt immediately lethargic only responding to pain) Behavior During Therapy: Flat affect Overall Cognitive Status: Difficult to assess Area of Impairment: Attention;Following commands;Safety/judgement;Awareness                   Current Attention Level: Focused   Following Commands: Follows one step commands inconsistently Safety/Judgement: Decreased awareness of deficits;Decreased awareness of safety Awareness: Intellectual   General Comments: pt making no efforts to verbalize this session, pt following one step commands inconsistently and noted to become lethargic once returend to supine      Exercises Other Exercises Other Exercises: pt requried assist to keep neck in neutral positon while sitting EOB, poor cervical control    General Comments General comments (skin integrity, edema, etc.): VSS on RA  Pertinent Vitals/Pain Pain Assessment: Faces Faces Pain Scale: No hurt Pain Intervention(s): Monitored during session    Home Living                      Prior Function            PT Goals (current goals can now be found in  the care plan section) Acute Rehab PT Goals Patient Stated Goal: none stated PT Goal Formulation: With patient Time For Goal Achievement: 09/09/20 Potential to Achieve Goals: Fair Progress towards PT goals: Not progressing toward goals - comment (limited by lethargy)    Frequency    Min 4X/week      PT Plan Current plan remains appropriate    Co-evaluation PT/OT/SLP Co-Evaluation/Treatment: Yes Reason for Co-Treatment: Necessary to address cognition/behavior during functional activity;For patient/therapist safety;To address functional/ADL transfers PT goals addressed during session: Mobility/safety with mobility;Balance OT goals addressed during session: ADL's and self-care      AM-PAC PT "6 Clicks" Mobility   Outcome Measure  Help needed turning from your back to your side while in a flat bed without using bedrails?: Total Help needed moving from lying on your back to sitting on the side of a flat bed without using bedrails?: Total Help needed moving to and from a bed to a chair (including a wheelchair)?: Total Help needed standing up from a chair using your arms (e.g., wheelchair or bedside chair)?: A Lot Help needed to walk in hospital room?: Total Help needed climbing 3-5 steps with a railing? : Total 6 Click Score: 7    End of Session Equipment Utilized During Treatment: Gait belt Activity Tolerance: Patient limited by lethargy Patient left: in bed;with call bell/phone within reach;with bed alarm set;with restraints reapplied Nurse Communication: Mobility status PT Visit Diagnosis: Muscle weakness (generalized) (M62.81);Unsteadiness on feet (R26.81);History of falling (Z91.81);Difficulty in walking, not elsewhere classified (R26.2);Other symptoms and signs involving the nervous system (Z60.109)     Time: 3235-5732 PT Time Calculation (min) (ACUTE ONLY): 23 min  Charges:  $Therapeutic Activity: 8-22 mins                     Anjelina Dung A. Dan Humphreys PT, DPT Acute  Rehabilitation Services Pager [phone] Office [phone]   Viviann Spare 08/31/2020, 3:10 PM

## 2020-08-31 NOTE — Progress Notes (Signed)
Patient arrived on unit. Rehab schedule, given. Patient is Ax1 and unable to answer admission questions. All other needs met, call light within reach.   Dayna Ramus

## 2020-08-31 NOTE — Discharge Instructions (Signed)
Delirium °Delirium is a state of mental confusion. It comes on quickly and causes significant changes in a person's thinking and behavior. °People with delirium usually have trouble paying attention to what is going on or knowing where they are. They may become very withdrawn or very emotional and unable to sit still. They may even see or feel things that are not there (hallucinations). Delirium is a sign of a serious underlying medical condition. °What are the causes? °Delirium occurs when something suddenly affects the signals that the brain sends out. Brain signals can be affected by anything that puts severe stress on the body and brain and causes brain chemicals to be out of balance. The most common causes of delirium include: °· Infections. These may be bacterial, viral, fungal, or protozoal. °· Medicines. These include many over-the-counter and prescription medicines. °· Recreational drugs. °· Substance withdrawal. This occurs with sudden discontinuation of alcohol, certain medicines, or recreational drugs. °· Surgery and anesthesia. °· Sudden vascular events, such as stroke and brain hemorrhage. °· Other brain disorders, such as migraines, tumors, seizures, and physical head trauma. °· Metabolic disorders, such as kidney or liver failure. °· Low blood oxygen (anoxia). This may occur with lung disease, cardiac arrest, or carbon monoxide poisoning. °· Hormone imbalances (endocrinopathies), such as an overactive thyroid (hyperthyroidism) or underactive thyroid (hypothyroidism). °· Vitamin deficiencies. °What increases the risk? °The following factors may make someone more likely to develop this condition: °· Being a child. °· Being an older person. °· Living alone. °· Having vision loss or hearing loss. °· Having an existing brain disease, such as dementia. °· Having long-lasting (chronic) medical conditions, such as heart disease. °· Being hospitalized for long periods of time. °What are the signs or  symptoms? °Delirium starts with a sudden change in a person's thinking or behavior. Symptoms include: °· Not being able to stay awake (drowsiness) or pay attention. °· Being confused about places, time, and people. °· Forgetfulness. °· Having extreme energy levels. These may be low or high. °· Changes in sleep patterns. °· Extreme mood swings, such as sudden anger or anxiety. °· Focusing on things or ideas that are not important. °· Rambling and senseless talking. °· Difficulty speaking, understanding speech, or both. °· Hallucinations. °· Tremor or unsteady gait. °Symptoms come and go throughout the day and are often worse at the end of the day. °How is this diagnosed? °People with delirium may not realize that they have the condition. Often, a family member or health care provider is the first person to notice the changes. This condition may be diagnosed based on a physical exam, health history, and tests. °· The health care provider will obtain a detailed history. This may include questions about: °? Current symptoms. °? Medical conditions that you have. °? Medicines. °? Drug use. °· The health care provider will perform a mental status test by: °? Asking questions to check for confusion. °? Watching for abnormal behavior. °· The health care provider may also order lab tests or additional studies to determine the cause of the delirium. °How is this treated? °Treatment of delirium depends on the cause and severity. Delirium usually goes away within days or weeks of treating the underlying cause. In the meantime, do not leave the person alone because he or she may accidentally cause self-harm. This condition may be treated with supportive care, such as: °· Increased light during the day and decreased light at night. °· Low noise level. °· Uninterrupted sleep. °· A regular daily   schedule. °· Clocks and calendars to help with orientation. °· Familiar objects, including the person's pictures and clothing. °· Frequent  visits from familiar family and friends. °· A healthy diet. °· Gentle exercise. °In more severe cases of delirium, medicine may be prescribed to help the person keep calm and think more clearly.   °Follow these instructions at home: °· Continue supportive care as told by a health care provider. °· Take over-the-counter and prescription medicines only as told by your health care provider. °· Ask a health care provider before using herbs or supplements. °· Do not use alcohol or illegal drugs. °· Keep all follow-up visits. This is important.   °Contact a health care provider if: °· Symptoms do not get better or they become worse. °· New symptoms of delirium develop. °· Caring for the person at home does not seem safe. °· Eating, drinking, or communicating stops. °· There are side effects of medicines, such as changes in sleep patterns, dizziness, weight gain, restlessness, movement changes, or tremors. °Get help right away if: °· The person has thoughts of harming self or harming others. °· There are serious side effects of medicine, such as: °? Swelling of the face, lips, tongue, or throat. °? Fever, confusion, muscle spasms, or seizures. °If you ever feel like a loved one may hurt himself or herself or others, or shares thoughts about taking his or her own life, get help right away. You can go to your nearest emergency department or: °· Call your local emergency services (911 in the [place] °· Call a suicide crisis helpline, such as the National Suicide Prevention Lifeline at 1-800-273-8255. This is open 24 hours a day in the [place] °· Text the Crisis Text Line at 741741 (in the [place] °Summary °· Delirium is a state of mental confusion. It comes on quickly and causes significant changes in a person's thinking and behavior. °· Delirium is a sign of a serious underlying medical condition. °· Certain medical conditions or a long hospital stay may increase the risk of developing delirium. °· Treatment of delirium involves  treating the underlying cause and providing supportive treatments, such as a calm and familiar environment. °This information is not intended to replace advice given to you by your health care provider. Make sure you discuss any questions you have with your health care provider. °Document Revised: 08/12/2019 Document Reviewed: 08/12/2019 °[employer] Patient Education © 2021 [employer] ° °

## 2020-08-31 NOTE — Progress Notes (Signed)
vLTM EEG complete. No skin breakdown 

## 2020-09-01 DIAGNOSIS — G9341 Metabolic encephalopathy: Principal | ICD-10-CM

## 2020-09-01 LAB — CULTURE, BLOOD (ROUTINE X 2)
Culture: NO GROWTH
Culture: NO GROWTH
Special Requests: ADEQUATE

## 2020-09-01 MED ORDER — QUETIAPINE FUMARATE 50 MG PO TABS
50.0000 mg | ORAL_TABLET | Freq: Three times a day (TID) | ORAL | Status: DC
  Administered 2020-09-01 – 2020-09-04 (×8): 50 mg via ORAL
  Filled 2020-09-01 (×9): qty 1

## 2020-09-01 MED ORDER — AMLODIPINE BESYLATE 10 MG PO TABS
10.0000 mg | ORAL_TABLET | Freq: Every day | ORAL | Status: DC
  Administered 2020-09-02 – 2020-10-27 (×56): 10 mg via ORAL
  Filled 2020-09-01 (×15): qty 1
  Filled 2020-09-01: qty 2
  Filled 2020-09-01 (×40): qty 1

## 2020-09-01 NOTE — Progress Notes (Signed)
Inpatient Rehabilitation  Patient information reviewed and entered into eRehab system by Nychelle Cassata M. Charlott Calvario, M.A., CCC/SLP, PPS Coordinator.  Information including medical coding, functional ability and quality indicators will be reviewed and updated through discharge.    

## 2020-09-01 NOTE — Evaluation (Signed)
Occupational Therapy Assessment and Plan  Patient Details  Name: Cindy [name] MRN: [id number] Date of Birth: 03-31-1961  OT Diagnosis: altered mental status, cognitive deficits, muscle weakness (generalized) and lethargy Rehab Potential: Rehab Potential (ACUTE ONLY): Fair ELOS: 3 to 3.5 weeks   Today's Date: 09/01/2020 OT Individual Time: 9735-3299 OT Individual Time Calculation (min): 19 min     Hospital Problem: Principal Problem:   Acute metabolic encephalopathy   Past Medical History:  Past Medical History:  Diagnosis Date  . Abnormal LFTs    Hx of  . CVA (cerebral vascular accident) ([place]) 2005  . Hyperlipidemia   . Hypertension   . Polysubstance dependence including opioid type drug, episodic abuse ([place])    cocaine/etoh    Past Surgical History:  Past Surgical History:  Procedure Laterality Date  . ESOPHAGOGASTRODUODENOSCOPY N/A 08/16/2019   Procedure: ESOPHAGOGASTRODUODENOSCOPY (EGD);  Surgeon: Clarene Essex, MD;  Location: [facility];  Service: Endoscopy;  Laterality: N/A;    Assessment & Plan Clinical Impression: Patient is a 60 y.o. 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  Patient did have admissions 07/2019 for seizure as well as 01/2020.  Per chart review independent with assistive device prior to admission living with her spouse.  She does have supportive family in the area.  Presented 08/23/2020 with altered mental status/slurred speech, right-sided weakness as well as reports of shaking and jerking episodes lasting 5 to 10 minutes.  Cranial CT/MRI scan negative for acute changes but did show remote lacunar infarct in the bilateral centrum semiovale, corona radiata and right thalamus.  CT angiogram head and neck no large vessel  occlusion or significant stenosis.  CT cervical spine no acute cervical spine fracture.  Admission chemistries alcohol negative, WBC 11,800, glucose 127, creatinine 1.21, lactic acid 2.0, urine drug screen positive cocaine, CK 1002, ammonia 13.  EEG suggestive of cortical dysfunction arising from left hemisphere likely secondary to underlying structural abnormality post ictal state no seizure or definite epileptiform discharges.  Neurology follow-up currently maintained on Vimpat 200 mg twice daily as well as valproic acid 1000 mg twice daily.  Patient with intermittent bouts of agitation restlessness maintained on Seroquel.  Subcutaneous Lovenox for DVT prophylaxis.  Urine culture 40,000 multiple species completing course of empiric Keflex.  Follow-up CK improved 534.  Tolerating mechanical soft diet.  Therapy evaluations completed due to patient decreased functional mobility was admitted for a comprehensive rehab program. She is currently in bilateral mittens.   Patient transferred to CIR on 08/31/2020 .    Patient currently requires total with basic self-care skills secondary to muscle weakness, decreased cardiorespiratoy endurance, impaired timing and sequencing, unbalanced muscle activation, motor apraxia, decreased coordination and decreased motor planning, decreased initiation, decreased attention, decreased awareness, decreased problem solving, decreased safety awareness, decreased memory and delayed processing and lethargy.  Prior to hospitalization, patient could complete BADL with min.  Patient will benefit from skilled intervention to decrease level of assist with basic self-care skills prior to discharge home with care partner.  Anticipate patient will require 24 hour supervision and moderate physical assestance and follow up home health.  OT - End of Session Endurance Deficit: Yes   OT Evaluation Precautions/Restrictions  Precautions Precautions: Fall Precaution Comments: bilateral mitts  and bedrails padded Restrictions Weight Bearing Restrictions: No General Chart Reviewed: Yes OT Amount of  Missed Time: 60 Minutes PT Missed Treatment Reason: Patient fatigue;Other (Comment) (lethargic) Family/Caregiver Present: Yes Pain Pain Assessment Pain Scale: Faces Faces Pain Scale: No hurt Home Living/Prior Functioning Home Living Available Help at Discharge: Family,Available 24 hours/day Type of Home: House Home Access: Ramped entrance Home Layout: Other (Comment) (pt unable to provide any PLOF or home setup during eval. chart review also unclear of these with notes of family not immediately available for DC planning) Bathroom Shower/Tub: Multimedia programmer: Standard Bathroom Accessibility: Yes Additional Comments: Confirmed with husband  Lives With: Spouse (lives with husband, husband avail for 24/7, additionally intermittant support from husband's family) IADL History Homemaking Responsibilities: No Current License: No Occupation: Unemployed Leisure and Hobbies: TV, walking outside on ramp Prior Function Level of Independence: Needs assistance with ADLs,Needs assistance with gait,Needs assistance with tranfers (husband provides CGA for toilet/shower transfers; pt uses RW at baseline)  Able to Take Stairs?: No Driving: No Vocation Requirements: per chart review, pt's brother reports pt has not worked in "some time" Comments: Hx obtained from prior admission - pt unable to report at this time Vision Baseline Vision/History: Wears glasses Wears Glasses: At all times Patient Visual Report: Other (comment) (unable to report 2/2 extreme lethargy, unable to rouse) Additional Comments: unable to be tested 2/2 pt's inability to follow commands Perception  Perception: Impaired Praxis Praxis: Impaired Cognition Overall Cognitive Status: Impaired/Different from baseline Arousal/Alertness: Suspect due to medications Orientation Level: Nonverbal/unable to  assess Memory: Impaired Attention: Focused Focused Attention: Impaired Awareness: Impaired Problem Solving: Impaired Problem Solving Impairment: Verbal basic Safety/Judgment: Impaired Comments: unable to assess 2/2 lethargy/unable to rouse Sensation Sensation Light Touch: Impaired by gross assessment Additional Comments: Pt unable to participate in formal assessment, no repsonse to noxious stimuli and touch to BUE/BLE extremities Motor  Motor Motor: Other (comment) Motor - Skilled Clinical Observations: diffcult to formally asses as pt unable to follow commands and lethargic  Trunk/Postural Assessment  Cervical Assessment Cervical Assessment: Exceptions to [facility] (pt unable to maintain head control 2/2 lethargy) Thoracic Assessment Thoracic Assessment: Exceptions to [facility] (unable to formally assess) Lumbar Assessment Lumbar Assessment: Exceptions to [facility] (unable to formally assess) Postural Control Postural Control: Deficits on evaluation (limited 2/2 lethargy)  Balance Balance Balance Assessed: No Dynamic Sitting Balance Sitting balance - Comments: unable to transfer pt fully into sitting EOB, pt unable to follow commands at bed level and unable to maintain trunk positioning with raised aware from [facility] Extremity/Trunk Assessment RUE Assessment RUE Assessment: Not tested General Strength Comments: unable to assess formally 2/2 lethargy, no tone noted LUE Assessment LUE Assessment: Not tested General Strength Comments: unable to assess formally 2/2 lethargy  Care Tool Care Tool Self Care Eating Eating activity did not occur: Safety/medical concerns      Oral Care    Oral Care Assist Level: Dependent - Patient 0%)    Bathing Bathing activity did not occur: Safety/medical concerns            Upper Body Dressing(including orthotics) Upper body dressing/undressing activity did not occur (including orthotics): Safety/medical concerns          Lower Body Dressing  (excluding footwear)   What is the patient wearing?: Pants Assist for lower body dressing: Dependent - Patient 0%    Putting on/Taking off footwear   What is the patient wearing?: Non-skid slipper socks Assist for footwear: Dependent - Patient 0%       Care Tool Toileting Toileting activity Toileting Activity did not occur Landscape architect and hygiene  only): N/A (no void or bm)       Care Tool Bed Mobility Roll left and right activity   Roll left and right assist level: Dependent - Patient 0%    Sit to lying activity Sit to lying activity did not occur: Safety/medical concerns      Lying to sitting edge of bed activity Lying to sitting edge of bed activity did not occur: Safety/medical concerns (attempted however pt unable to follow any commands and limited alertness in dependent long sitting, determined unsafe to attempt sitting EOB for eval)       Care Tool Transfers Sit to stand transfer Sit to stand activity did not occur: Safety/medical concerns      Chair/bed transfer Chair/bed transfer activity did not occur: Safety/medical concerns       Toilet transfer Toilet transfer activity did not occur: Safety/medical concerns       Care Tool Cognition Expression of Ideas and Wants Expression of Ideas and Wants: Rarely/Never expressess or very difficult - rarely/never expresses self or speech is very difficult to understand   Understanding Verbal and Non-Verbal Content Understanding Verbal and Non-Verbal Content: Sometimes understands - understands only basic conversations or simple, direct phrases. Frequently requires cues to understand   Memory/Recall Ability *first 3 days only Memory/Recall Ability *first 3 days only: None of the above were recalled    Refer to Care Plan for Long Term Goals  SHORT TERM GOAL WEEK 1 OT Short Term Goal 1 (Week 1): Pt will demonstrate improved attention to grooming task for >1 min with mod A. OT Short Term Goal 2 (Week 1): Pt will  tolerate sitting EOB with mod A for >2 min. OT Short Term Goal 3 (Week 1): Pt will don shirt max A. OT Short Term Goal 4 (Week 1): Pt will self-feed 25% of meal with mod A.  Recommendations for other services: Neuropsych   Skilled Therapeutic Intervention ADL ADL Eating: Unable to assess Grooming: Unable to assess Upper Body Bathing: Unable to assess Lower Body Bathing: Unable to assess Upper Body Dressing: Unable to assess Lower Body Dressing: Dependent Where Assessed-Lower Body Dressing: Bed level Toileting: Unable to assess Toilet Transfer: Unable to assess Tub/Shower Transfer: Unable to assess Social research officer, government: Unable to assess Mobility  Bed Mobility Bed Mobility: Rolling Right;Rolling Left;Supine to Sit;Scooting to [facility] Rolling Right: Dependent - Patient equal 0% Rolling Left: Dependent - Patient equal 0% Supine to Sit: 2 Helpers Scooting to [facility]: 2 Helpers  Session Note: Pt received asleep in bed with husband present, husband agreeable to OT eval. Reviewed role of CIR OT, evaluation process, ADL/func mobility retraining, goals for therapy, and safety plan. Evaluation completed as documented above with session focus on confirming PLOF and discussing purpose/goals of OT with husband. Unable to rouse pt despite max multimodal cuing including cold wash cloth to face, nailbed pressure, elevating HOB, rolling R/L to don pants, and attempting to transition to long sit. Will attempt to see again as able if pt more alert/appropriate. Pt left with HOB at 30, 4 bed rails up, call bell in reach, and all immediate needs met.    Discharge Criteria: Patient will be discharged from OT if patient refuses treatment 3 consecutive times without medical reason, if treatment goals not met, if there is a change in medical status, if patient makes no progress towards goals or if patient is discharged from hospital.  The above assessment, treatment plan, treatment alternatives and goals were  discussed and mutually agreed upon: by  family  Volanda Napoleon MS, OTR/L  09/01/2020, 12:27 PM

## 2020-09-01 NOTE — Evaluation (Addendum)
Physical Therapy Assessment and Plan  Patient Details  Name: Cindy [name] MRN: [id number] Date of Birth: 1961-01-14  PT Diagnosis: Abnormal posture, Abnormality of gait, Cognitive deficits, Coordination disorder, Difficulty walking, Impaired cognition, Impaired sensation and Muscle weakness Rehab Potential: Fair ELOS: 3 weeks   Today's Date: 09/01/2020 PT Individual Time: 1700-1749 PT Individual Time Calculation (min): 45 min    Hospital Problem: Principal Problem:   Acute metabolic encephalopathy   Past Medical History:  Past Medical History:  Diagnosis Date  . Abnormal LFTs    Hx of  . CVA (cerebral vascular accident) ([place]) 2005  . Hyperlipidemia   . Hypertension   . Polysubstance dependence including opioid type drug, episodic abuse ([place])    cocaine/etoh    Past Surgical History:  Past Surgical History:  Procedure Laterality Date  . ESOPHAGOGASTRODUODENOSCOPY N/A 08/16/2019   Procedure: ESOPHAGOGASTRODUODENOSCOPY (EGD);  Surgeon: Clarene Essex, MD;  Location: [place];  Service: Endoscopy;  Laterality: N/A;    Assessment & Plan Clinical Impression: Patient is a 60 y.o. year old female withhistory of hypertension, hyperlipidemia, bilateral cerebral convexity infarct frontal region receiving CIR 08/19/2019-08/27/2019 and maintained on Plavix, 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  Patient did have admissions 07/2019 for seizure as well as 01/2020.  Per chart review independent with assistive device prior to admission living with her spouse.  She does have supportive family in the area.  Presented 08/23/2020 with altered mental status/slurred speech, right-sided weakness as well as reports of shaking and jerking episodes lasting 5 to 10 minutes.  Cranial CT/MRI scan negative for acute changes but did show remote lacunar infarct in the bilateral centrum semiovale, corona radiata and right thalamus.  CT angiogram  head and neck no large vessel occlusion or significant stenosis.  CT cervical spine no acute cervical spine fracture.  Admission chemistries alcohol negative, WBC 11,800, glucose 127, creatinine 1.21, lactic acid 2.0, urine drug screen positive cocaine, CK 1002, ammonia 13.  EEG suggestive of cortical dysfunction arising from left hemisphere likely secondary to underlying structural abnormality post ictal state no seizure or definite epileptiform discharges.  Neurology follow-up currently maintained on Vimpat 200 mg twice daily as well as valproic acid 1000 mg twice daily.  Patient with intermittent bouts of agitation restlessness maintained on Seroquel.  Subcutaneous Lovenox for DVT prophylaxis.  Urine culture 40,000 multiple species completing course of empiric Keflex.  Follow-up CK improved 534.  Tolerating mechanical soft diet.  Therapy evaluations completed due to patient decreased functional mobility was admitted for a comprehensive rehab program. She is currently in bilateral mittens.  Patient transferred to CIR on 08/31/2020 .   Patient currently requires total with mobility secondary to muscle weakness, decreased cardiorespiratoy endurance, impaired timing and sequencing, abnormal tone, decreased coordination and decreased motor planning and decreased sitting balance, decreased standing balance, decreased postural control and decreased balance strategies.  Prior to hospitalization, patient was modified independent  with mobility and lived with Spouse (per chart review) in a House (per chart review) home.  Home access is  Other (comment) (pt unable to provide any PLOF or home setup during eval. chart review also unclear of these with notes of family not immediately available for DC planning).  Patient will benefit from skilled PT intervention to maximize safe functional mobility, minimize fall risk and decrease caregiver burden for planned discharge home with 24 hour assist.  Anticipate patient will  benefit from follow up [facility] at discharge.  PT - End of Session Activity Tolerance: Tolerates 10 - 20 min activity with multiple rests Endurance Deficit: Yes PT Assessment Rehab Potential (ACUTE/IP ONLY): Fair PT Barriers to Discharge: Medication compliance;Behavior;Nutrition means;Insurance for SNF coverage;Incontinence;Lack of/limited family support PT Barriers to Discharge Comments: pt's current lethargy and command following limiting assessment and function PT Patient demonstrates impairments in the following area(s): Balance;Safety;Sensory;Behavior;Nutrition;Pain;Skin Integrity;Endurance;Motor PT Transfers Functional Problem(s): Bed Mobility;Bed to Chair;Car PT Locomotion Functional Problem(s): Ambulation;Wheelchair Mobility;Stairs PT Plan PT Intensity: Minimum of 1-2 x/day ,45 to 90 minutes PT Frequency: 5 out of 7 days PT Duration Estimated Length of Stay: 3 weeks PT Treatment/Interventions: Ambulation/gait training;Balance/vestibular training;Cognitive remediation/compensation;Community reintegration;Discharge planning;Disease management/prevention;Functional electrical stimulation;Functional mobility training;DME/adaptive equipment instruction;Neuromuscular re-education;Pain management;Patient/family education;Psychosocial support;Skin care/wound management;Splinting/orthotics;Stair training;Therapeutic Activities;UE/LE Strength taining/ROM;Therapeutic Exercise;UE/LE Coordination activities;Visual/perceptual remediation/compensation;Wheelchair propulsion/positioning PT Transfers Anticipated Outcome(s): min A with LRAD pending progress PT Locomotion Anticipated Outcome(s): min A with LRAD pening progress PT Recommendation Recommendations for Other Services: Speech consult;Therapeutic Recreation consult;Neuropsych consult Therapeutic Recreation Interventions: Stress management Follow Up Recommendations: Home health PT Patient destination: Home Equipment Recommended: To be  determined Equipment Details: unknown if pt has DME at home   PT Evaluation Precautions/Restrictions Precautions Precautions: Fall Precaution Comments: bilateral mitts and bedrails padded Restrictions Weight Bearing Restrictions: No General PT Amount of Missed Time (min): 15 Minutes PT Missed Treatment Reason: Other (Comment) (pt unable to participate in remaining scheduled time 2/2 poor command following and poor alertness) Vital Signs Pain Pain Assessment Pain Scale: Faces Pain Score: 0-No pain Faces Pain Scale: No hurt Home Living/Prior Functioning Home Living Available Help at Discharge: Other (Comment) (pt unable to provide any PLOF or home setup during eval. chart review also unclear of these with notes of family not immediately available for DC planning) Type of Home: House (per chart review) Home Access: Other (comment) (pt unable to provide any PLOF or home setup during eval. chart review also unclear of these with notes of family not immediately available for DC planning) Home Layout: Other (Comment) (pt unable to provide any PLOF or home setup during eval. chart review also unclear of these with notes of family not immediately available for DC planning) Bathroom Shower/Tub: Other (comment) (pt unable to provide any PLOF or home setup during eval. chart review also unclear of these with notes of family not immediately available for DC planning) Additional Comments: pt unable to provide any PLOF or home setup during eval. chart review also unclear of these with notes of family not immediately available for DC planning  Lives With: Spouse (per chart review) Prior Function Level of Independence: Independent with basic ADLs;Other (comment) (per chart review. and unclear if pt required DME)  Able to Take Stairs?:  (unknown at time of eval) Driving:  (unknown at time of eval) Vocation Requirements: per chart review, pt's brother reports pt has not worked in "some time" Comments:  Hx obtained from prior admission - pt unable to report at this time Vision/Perception  Vision - Assessment Additional Comments: unable to be tested 2/2 pt's inability to follow commands Perception Perception: Impaired Praxis Praxis: Impaired  Cognition Overall Cognitive Status: No family/caregiver present to determine baseline cognitive functioning Arousal/Alertness: Suspect due to medications Orientation Level: Oriented to person Attention: Focused Focused Attention: Impaired Memory: Impaired Awareness: Impaired Problem Solving: Impaired Problem Solving Impairment: Verbal basic Safety/Judgment: Impaired Sensation Sensation Light Touch: Impaired by gross assessment (pt had no pain withdraw on LLE or LUE. mild pain withdraw at RLE and zero at RUE at time of eval) Motor  Motor Motor: Other (comment) Motor - Skilled Clinical Observations: diffcult to formally asses as pt unable to follow commands and lethargic   Trunk/Postural Assessment  Cervical Assessment Cervical Assessment: Exceptions to [facility] (pt unable to maintain head control 2/2 lethargy) Thoracic Assessment Thoracic Assessment: Exceptions to [facility] (unable to formally assess) Lumbar Assessment Lumbar Assessment: Exceptions to [facility] (unable to formally assess) Postural Control Postural Control: Deficits on evaluation (limited 2/2 lethargy)  Balance Balance Balance Assessed: No Dynamic Sitting Balance Sitting balance - Comments: unable to transfer pt fully into sitting EOB, pt unable to follow commands at bed level and unable to maintain trunk positioning with raised aware from [facility] Extremity Assessment      RLE Assessment RLE Assessment: Exceptions to [facility] Passive Range of Motion (PROM) Comments: [facility] General Strength Comments: unable to formally assess as pt too lethargic LLE Assessment LLE Assessment: Exceptions to [facility] Passive Range of Motion (PROM) Comments: [facility] General Strength Comments: unable to formally assess as  pt too lethargic  Care Tool Care Tool Bed Mobility Roll left and right activity   Roll left and right assist level: Dependent - Patient 0%    Sit to lying activity Sit to lying activity did not occur: Safety/medical concerns      Lying to sitting edge of bed activity Lying to sitting edge of bed activity did not occur: Safety/medical concerns (attempted however pt unable to follow any commands and limited alertness in dependent long sitting, determined unsafe to attempt sitting EOB for eval)       Care Tool Transfers Sit to stand transfer Sit to stand activity did not occur: Safety/medical concerns      Chair/bed transfer Chair/bed transfer activity did not occur: Safety/medical concerns       Toilet transfer Toilet transfer activity did not occur: Safety/medical concerns      Scientist, product/process development transfer activity did not occur: Safety/medical concerns        Care Tool Locomotion Ambulation Ambulation activity did not occur: Safety/medical concerns        Walk 10 feet activity Walk 10 feet activity did not occur: Safety/medical concerns       Walk 50 feet with 2 turns activity Walk 50 feet with 2 turns activity did not occur: Safety/medical concerns      Walk 150 feet activity Walk 150 feet activity did not occur: Safety/medical concerns      Walk 10 feet on uneven surfaces activity Walk 10 feet on uneven surfaces activity did not occur: Safety/medical concerns      Stairs Stair activity did not occur: Safety/medical concerns        Walk up/down 1 step activity Walk up/down 1 step or curb (drop down) activity did not occur: Safety/medical concerns     Walk up/down 4 steps activity did not occuR: Safety/medical concerns  Walk up/down 4 steps activity      Walk up/down 12 steps activity Walk up/down 12 steps activity did not occur: Safety/medical concerns      Pick up small objects from floor Pick up small object from the floor (from standing position) activity  did not occur: Safety/medical concerns      Wheelchair Will patient use wheelchair at discharge?:  (pt may benefit from [facility] assessment however unsafe to attempt this AM)   Wheelchair activity did not occur: Safety/medical concerns      Wheel 50 feet with 2 turns activity Wheelchair 50 feet with 2 turns activity did not occur: Safety/medical concerns    Wheel  150 feet activity Wheelchair 150 feet activity did not occur: Safety/medical concerns      Refer to Care Plan for Long Term Goals  SHORT TERM GOAL WEEK 1 PT Short Term Goal 1 (Week 1): pt to demonstrated supine<>sit max A x1 PT Short Term Goal 2 (Week 1): pt to demonstrate sitting balance 5 min at mod A x1 PT Short Term Goal 3 (Week 1): pt to demonstrate bed<>chair transfers max A x1 PT Short Term Goal 4 (Week 1): pt to tolerate sitting OOB 1 hour for improved participation with therapy PT Short Term Goal 5 (Week 1): Initiate gait training  Recommendations for other services: Neuropsych and Therapeutic Recreation  Stress management  Skilled Therapeutic Intervention  Evaluation completed (see details above and below) with education on PT POC and goals and individual treatment initiated with focus on  Bed mobility, safety awareness, call light use. pt received in bed extremely lethargic, not following any commands, unable to to track with either eye and unable to increase lethargy to name. Heavy sternal rub given with pt slightly increasing alertness with B eyes very slightly opening however no noted mobility. Nursing brought to bedside and reported she had medications this AM and seemingly have made her more lethargic. Per nursing, pt having hallucinations, agitated, confused, and yelling this AM. PT attempted to utilize hospital bed functions for increased HOB elevation and simulate chair function to improve alertness however unsuccessful, warm wash cloth used to gently wash pt's face with pt opening one eye during this but then quickly  closed and only slight head turn. Pt unable to perform any verbal responses during evaluation. Pt did not answer to name at all. PT attempted to bring trunk forward away from hospital bed for support to attempt to increase alertness and sitting static sitting however pt unable to maintain position for any amount of time and made no functional attempt to complete task. PT assessed sensation with BUE and BLE with pt having 1/4 pain withdraw only at RLE with light eye twitch and mild withdraw. Pt unsafe to attempt any transfers or sitting EOB during this eval 2/2 poor alertness and very limited with lethargy and poor command following. Pt would benefit from additional further testing once pt is more attentive, following more commands, and more alert. Pt left in bed, All needs in reach and in good condition. Call light in hand.  And alarm set. B mittens in place as found. PT also retrieved manual WC and cushion from DME room for pt when appropriate to attempt transfer and WC mobility.     Mobility Bed Mobility Bed Mobility: Rolling Right;Rolling Left;Supine to Sit;Scooting to [facility] Rolling Right: Dependent - Patient equal 0% Rolling Left: Dependent - Patient equal 0% Supine to Sit: 2 Helpers Scooting to [facility]: 2 Helpers Transfers Transfer via Lift Equipment: Maximove (recommended maximove to transfer pt at this time for safety.) Locomotion  Gait Ambulation: No Gait Gait: No Stairs / Additional Locomotion Stairs: No Wheelchair Mobility Wheelchair Mobility:  (pt would likely benefit from [facility] assessment however pt unable to safely complete this at time of eval 2/2 lethargy and inability to follow commands)   Discharge Criteria: Patient will be discharged from PT if patient refuses treatment 3 consecutive times without medical reason, if treatment goals not met, if there is a change in medical status, if patient makes no progress towards goals or if patient is discharged from hospital.  The above  assessment, treatment plan, treatment alternatives and goals were discussed and  mutually agreed upon: No family available/patient unable  Junie Panning 09/01/2020, 11:57 AM

## 2020-09-01 NOTE — Progress Notes (Signed)
Patient observed at rest upon assessment and monitoring, non verbal but staring, respiration unlabored on room air. No noted seizure episodes or activities noted,wave her arms occasionally in the air, without agitation or noted attempts  to get OOB. bladder Incontinecet episodes x1 large amount,ADL care provided and reposition with 2 staff members, consumed po liquids w/o difficult. Call bell and bed alarms in place.Continue medical regime.

## 2020-09-01 NOTE — Progress Notes (Signed)
Pt woke up agitated, upset, attempt to give 50mg  of seroquel but pt not wanting to swallow medication, spitting medication back out, and spitting out applesauce and fluids. A/Ox1 unable to asses pain due to being nonverbal, responding to nurses voice when speaking to patient with staring toward nurse. Call light in place, bed alarms on.   .

## 2020-09-01 NOTE — Progress Notes (Signed)
Yelling out and moving hand in the air saying stop Cindy [name], unaware of anyone else in the room with her, agitated when attempting to give her mediation swinging her fist at writing, unsuccessful in reoriented patient,light dimmed, environment calmer, activities provided and able to provided medication once patient calmed down. Monitor closely. No active seizure noted,Side railing up, floor mats in place and hand mittens on .

## 2020-09-01 NOTE — Progress Notes (Signed)
PROGRESS NOTE   Subjective/Complaints: Sleeping Has dentures in room Agitated and confused during SLP session  ROS: +agitation as per SLP  Objective:   No results found. Recent Labs    08/30/20 0245  WBC 4.5  HGB 11.2*  HCT 32.8*  PLT 381   Recent Labs    08/30/20 0245  NA 135  K 3.5  CL 105  CO2 23  GLUCOSE 105*  BUN <5*  CREATININE 0.94  CALCIUM 9.2    Intake/Output Summary (Last 24 hours) at 09/01/2020 1421 Last data filed at 09/01/2020 1415 Gross per 24 hour  Intake 220 ml  Output --  Net 220 ml        Physical Exam: Vital Signs Blood pressure (!) 127/96, pulse 73, temperature 97.7 F (36.5 C), temperature source Oral, resp. rate 16, SpO2 97 %. Gen: no distress, normal appearing HEENT: oral mucosa pink and moist, NCAT, without teeth Cardio: Reg rate Chest: normal effort, normal rate of breathing Abd: soft, non-distended Ext: no edema Psych: pleasant, bilateral mittens in place Skin: intact Neurological:     Comments: Patient is alert.  Bilateral mittens in place.  She does make eye contact with examiner.  She is easily distracted.  Speech is of low tone but does provide her name and place. Appears to have 4/5 strength throughout; inconsistent in following commands.     Assessment/Plan: 1. Functional deficits which require 3+ hours per day of interdisciplinary therapy in a comprehensive inpatient rehab setting.  Physiatrist is providing close team supervision and 24 hour management of active medical problems listed below.  Physiatrist and rehab team continue to assess barriers to discharge/monitor patient progress toward functional and medical goals  Care Tool:  Bathing  Bathing activity did not occur: Safety/medical concerns           Bathing assist       Upper Body Dressing/Undressing Upper body dressing Upper body dressing/undressing activity did not occur (including  orthotics): Safety/medical concerns      Upper body assist Assist Level: Total Assistance - Patient < 25%    Lower Body Dressing/Undressing Lower body dressing      What is the patient wearing?: Pants     Lower body assist Assist for lower body dressing: Dependent - Patient 0%     Toileting Toileting Toileting Activity did not occur (Clothing management and hygiene only): N/A (no void or bm)  Toileting assist Assist for toileting: Total Assistance - Patient < 25%     Transfers Chair/bed transfer  Transfers assist  Chair/bed transfer activity did not occur: Safety/medical concerns        Locomotion Ambulation   Ambulation assist   Ambulation activity did not occur: Safety/medical concerns          Walk 10 feet activity   Assist  Walk 10 feet activity did not occur: Safety/medical concerns        Walk 50 feet activity   Assist Walk 50 feet with 2 turns activity did not occur: Safety/medical concerns         Walk 150 feet activity   Assist Walk 150 feet activity did not occur: Safety/medical concerns  Walk 10 feet on uneven surface  activity   Assist Walk 10 feet on uneven surfaces activity did not occur: Safety/medical concerns         Wheelchair     Assist Will patient use wheelchair at discharge?:  (pt may benefit from [facility] assessment however unsafe to attempt this AM)   Wheelchair activity did not occur: Safety/medical concerns         Wheelchair 50 feet with 2 turns activity    Assist    Wheelchair 50 feet with 2 turns activity did not occur: Safety/medical concerns       Wheelchair 150 feet activity     Assist  Wheelchair 150 feet activity did not occur: Safety/medical concerns       Blood pressure (!) 127/96, pulse 73, temperature 97.7 F (36.5 C), temperature source Oral, resp. rate 16, SpO2 97 %.  Medical Problem List and Plan: 1.  Right side weakness with decreased functional mobility  secondary to delirium and acute metabolic encephalopathy due to status epilepticus             -patient may shower             -ELOS/Goals: modA 2-3 weeks             -Initial CIR evals today 2.  Impaired mobility -DVT/anticoagulation: Continue Lovenox             -antiplatelet therapy: Aspirin 325 mg 3. Pain Management: Tylenol as needed 4. Agitation: Seroquel 50 mg TID ordered as agitation impacted ability to participate in therapy after receiving BID. Continue bilateral mittens as needed 5. Neuropsych: This patient is capable of making decisions on her own behalf. 6. Skin/Wound Care: Routine skin checks 7. Fluids/Electrolytes/Nutrition: Routine in and outs with follow-up chemistries 8.  Seizure disorder.  Vimpat 200 mg twice daily, valproic acid 1000 mg twice daily.  EEG negative 9.  Hypertension.  Norvasc 5 mg daily, Toprol-XL 50 mg daily.  Monitor with increased mobility  4/16: diastolic BP elevated, increase Norvasc to 10mg .  10.  History of bilateral cerebral convexity infarction frontal region.  CIR 08/19/2019-08/27/2019 11.  Hyperlipidemia.  Continue Lipitor 12.  History of polysubstance abuse.  Urine drug screen positive cocaine.  Counseling 13. AKI: creatinine normal on 4/14, monitor as needed.  14. UTI: UA positive, Keflex started, repeat UC ordered since with multiple species.     LOS: 1 days A FACE TO FACE EVALUATION WAS PERFORMED  5/14 P Ishitha Roper 09/01/2020, 2:21 PM

## 2020-09-01 NOTE — Plan of Care (Signed)
Problem: RH Balance Goal: LTG: Patient will maintain dynamic sitting balance (OT) Description: LTG:  Patient will maintain dynamic sitting balance with assistance during activities of daily living (OT) Flowsheets (Taken 09/01/2020 1251) LTG: Pt will maintain dynamic sitting balance during ADLs with: Contact Guard/Touching assist Goal: LTG Patient will maintain dynamic standing with ADLs (OT) Description: LTG:  Patient will maintain dynamic standing balance with assist during activities of daily living (OT)  Flowsheets (Taken 09/01/2020 1251) LTG: Pt will maintain dynamic standing balance during ADLs with: Moderate Assistance - Patient 50 - 74%   Problem: Sit to Stand Goal: LTG:  Patient will perform sit to stand in prep for activites of daily living with assistance level (OT) Description: LTG:  Patient will perform sit to stand in prep for activites of daily living with assistance level (OT) Flowsheets (Taken 09/01/2020 1251) LTG: PT will perform sit to stand in prep for activites of daily living with assistance level: Moderate Assistance - Patient 50 - 74%   Problem: RH Eating Goal: LTG Patient will perform eating w/assist, cues/equip (OT) Description: LTG: Patient will perform eating with assist, with/without cues using equipment (OT) Flowsheets (Taken 09/01/2020 1251) LTG: Pt will perform eating with assistance level of: Minimal Assistance - Patient > 75%   Problem: RH Grooming Goal: LTG Patient will perform grooming w/assist,cues/equip (OT) Description: LTG: Patient will perform grooming with assist, with/without cues using equipment (OT) Flowsheets (Taken 09/01/2020 1251) LTG: Pt will perform grooming with assistance level of: Minimal Assistance - Patient > 75%   Problem: RH Bathing Goal: LTG Patient will bathe all body parts with assist levels (OT) Description: LTG: Patient will bathe all body parts with assist levels (OT) Flowsheets (Taken 09/01/2020 1251) LTG: Pt will perform  bathing with assistance level/cueing: Moderate Assistance - Patient 50 - 74%   Problem: RH Dressing Goal: LTG Patient will perform upper body dressing (OT) Description: LTG Patient will perform upper body dressing with assist, with/without cues (OT). Flowsheets (Taken 09/01/2020 1251) LTG: Pt will perform upper body dressing with assistance level of: Minimal Assistance - Patient > 75% Goal: LTG Patient will perform lower body dressing w/assist (OT) Description: LTG: Patient will perform lower body dressing with assist, with/without cues in positioning using equipment (OT) Flowsheets (Taken 09/01/2020 1251) LTG: Pt will perform lower body dressing with assistance level of: Moderate Assistance - Patient 50 - 74%   Problem: RH Toileting Goal: LTG Patient will perform toileting task (3/3 steps) with assistance level (OT) Description: LTG: Patient will perform toileting task (3/3 steps) with assistance level (OT)  Flowsheets (Taken 09/01/2020 1251) LTG: Pt will perform toileting task (3/3 steps) with assistance level: Minimal Assistance - Patient > 75%   Problem: RH Toilet Transfers Goal: LTG Patient will perform toilet transfers w/assist (OT) Description: LTG: Patient will perform toilet transfers with assist, with/without cues using equipment (OT) Flowsheets (Taken 09/01/2020 1251) LTG: Pt will perform toilet transfers with assistance level of: Moderate Assistance - Patient 50 - 74%   Problem: RH Tub/Shower Transfers Goal: LTG Patient will perform tub/shower transfers w/assist (OT) Description: LTG: Patient will perform tub/shower transfers with assist, with/without cues using equipment (OT) Flowsheets (Taken 09/01/2020 1251) LTG: Pt will perform tub/shower stall transfers with assistance level of: Moderate Assistance - Patient 50 - 74%   Problem: RH Attention Goal: LTG Patient will demonstrate this level of attention during functional activites (OT) Description: LTG:  Patient will  demonstrate this level of attention during functional activites  (OT) Flowsheets (Taken 09/01/2020 1251) Patient will  demonstrate this level of attention during functional activites: Sustained Patient will demonstrate above attention level in the following environment: Controlled LTG: Patient will demonstrate this level of attention during functional activites (OT): Minimal Assistance - Patient > 75%   Problem: RH Awareness Goal: LTG: Patient will demonstrate awareness during functional activites type of (OT) Description: LTG: Patient will demonstrate awareness during functional activites type of (OT) Flowsheets (Taken 09/01/2020 1251) Patient will demonstrate awareness during functional activites type of: Intellectual LTG: Patient will demonstrate awareness during functional activites type of (OT): Minimal Assistance - Patient > 75%

## 2020-09-01 NOTE — Evaluation (Signed)
Speech Language Pathology Assessment and Plan  Patient Details  Name: Cindy [name] MRN: [id number] Date of Birth: December 17, 1960  SLP Diagnosis: Cognitive Impairments;Dysphagia;Speech and Language deficits  Rehab Potential: Good ELOS: 12-14 days    Today's Date: 09/01/2020 SLP Individual Time: 3300-7622 SLP Individual Time Calculation (min): 45 min   Hospital Problem: Principal Problem:   Acute metabolic encephalopathy  Past Medical History:  Past Medical History:  Diagnosis Date  . Abnormal LFTs    Hx of  . CVA (cerebral vascular accident) ([place]) 2005  . Hyperlipidemia   . Hypertension   . Polysubstance dependence including opioid type drug, episodic abuse ([place])    cocaine/etoh    Past Surgical History:  Past Surgical History:  Procedure Laterality Date  . ESOPHAGOGASTRODUODENOSCOPY N/A 08/16/2019   Procedure: ESOPHAGOGASTRODUODENOSCOPY (EGD);  Surgeon: Clarene Essex, MD;  Location: [facility];  Service: Endoscopy;  Laterality: N/A;    Assessment / Plan / Recommendation Clinical Impression   Cindy [name] is a 60 year old right-handed female history of hypertension, hyperlipidemia, bilateral cerebral convexity infarct frontal region receiving CIR 08/19/2019-08/27/2019 and maintained on Plavix, 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  Patient did have admissions 07/2019 for seizure as well as 01/2020.  Per chart review independent with assistive device prior to admission living with her spouse.  She does have supportive family in the area.  Presented 08/23/2020 with altered mental status/slurred speech, right-sided weakness as well as reports of shaking and jerking episodes lasting 5 to 10 minutes.  Cranial CT/MRI scan negative for acute changes but did show remote lacunar infarct in the bilateral centrum semiovale, corona radiata and right thalamus.  CT angiogram head and neck no large vessel occlusion or significant  stenosis.  CT cervical spine no acute cervical spine fracture.  Admission chemistries alcohol negative, WBC 11,800, glucose 127, creatinine 1.21, lactic acid 2.0, urine drug screen positive cocaine, CK 1002, ammonia 13.  EEG suggestive of cortical dysfunction arising from left hemisphere likely secondary to underlying structural abnormality post ictal state no seizure or definite epileptiform discharges.  Neurology follow-up currently maintained on Vimpat 200 mg twice daily as well as valproic acid 1000 mg twice daily.  Patient with intermittent bouts of agitation restlessness maintained on Seroquel.  Subcutaneous Lovenox for DVT prophylaxis.  Urine culture 40,000 multiple species completing course of empiric Keflex.  Follow-up CK improved 534.  Tolerating mechanical soft diet.  Therapy evaluations completed due to patient decreased functional mobility was admitted for a comprehensive rehab program. She is currently in bilateral mittens.   Pt presents with mild oral dysphagia. She is currently on a soft diet and thin liquids. She is edentulous but has upper dentures in room. She required max A for self feeding due to attention/cognition. She demonstrated decreased mastication with french toast on tray but eventually able to clear. Coughing noted x1 before swallow with oatmeal likely due to patient talking during po intake. She tolerated controlled sips of thin liquid via straw with no overt s/sx of aspiration or penetration. Recommend pt continue with soft diet and thin liquids with full supervision. SLP will continue to assess tolerance and safety with this consistency.   Pt presents with severe cognitive linguistic impairment. Pt was very agitated, confused, and having hallucinations during evaluation. Suspected that these factors impacted patient performance on evaluation. She remained alert throughout entire session. Max A with redirection to SLP and cognitive testing required. She was very fearful most of  session and pointing in corner of room asking SLP to "get the ____." Speech was 60% intelligible due to pt being very upset. She answered 1/5 yes/no questions and was able to follow simple commands in context (during meal and to wipe her face with washcloth). She required choice of 2 options to respond to orientation questions and stated she was at home and it was January. Additional formal testing needed when patient is more attentive and less agitated. Pt will benefit from skilled SLP intervention.       Skilled Therapeutic Interventions          SLP evaluation completed. Plan of care reviewed with patient. Additional formal testing needed when patient is less agitated.   SLP Assessment  Patient will need skilled Speech Lanaguage Pathology Services during CIR admission    Recommendations  SLP Diet Recommendations: Other (Comment) (soft diet) Liquid Administration via: Cup Medication Administration: Whole meds with puree Supervision: Staff to assist with self feeding Compensations: Slow rate;Small sips/bites Postural Changes and/or Swallow Maneuvers: Seated upright 90 degrees;Upright 30-60 min after meal Oral Care Recommendations: Oral care BID Patient destination: Home Follow up Recommendations: Home Health SLP;Outpatient SLP Equipment Recommended: None recommended by SLP    SLP Frequency 3 to 5 out of 7 days   SLP Duration  SLP Intensity  SLP Treatment/Interventions 12-14 days  Minumum of 1-2 x/day, 30 to 90 minutes  Cognitive remediation/compensation;Speech/Language facilitation;Therapeutic Activities;Therapeutic Exercise;Dysphagia/aspiration precaution training;Patient/family education    Pain Pain Assessment Pain Scale: Faces Pain Score: 0-No pain Faces Pain Scale: No hurt  Prior Functioning Cognitive/Linguistic Baseline: Within functional limits Type of Home: House  SLP Evaluation Cognition Overall Cognitive Status: Difficult to assess Arousal/Alertness:  Awake/alert Orientation Level: Oriented to person Memory: Impaired Awareness: Impaired Problem Solving: Impaired Problem Solving Impairment: Verbal basic Safety/Judgment: Impaired  Comprehension Auditory Comprehension Overall Auditory Comprehension: Impaired Yes/No Questions: Impaired (difficult to assess due to agitation, peseverations and hallucinations) Basic Biographical Questions: 0-25% accurate Basic Immediate Environment Questions: 0-24% accurate Interfering Components: Attention;Processing speed;Working Marine scientist;Anxiety EffectiveTechniques: Repetition;Extra processing time Visual Recognition/Discrimination Discrimination: Not tested Reading Comprehension Reading Status: Not tested Expression Expression Primary Mode of Expression: Verbal Verbal Expression Overall Verbal Expression: Impaired Initiation: Impaired Level of Generative/Spontaneous Verbalization: Phrase;Sentence Repetition: Impaired Interfering Components: Attention Oral Motor Oral Motor/Sensory Function Overall Oral Motor/Sensory Function: Other (comment) (Pt unable to follow commands for OME) Motor Speech Overall Motor Speech: Impaired  Care Tool Care Tool Cognition Expression of Ideas and Wants Expression of Ideas and Wants: Rarely/Never expressess or very difficult - rarely/never expresses self or speech is very difficult to understand   Understanding Verbal and Non-Verbal Content Understanding Verbal and Non-Verbal Content: Sometimes understands - understands only basic conversations or simple, direct phrases. Frequently requires cues to understand   Memory/Recall Ability *first 3 days only Memory/Recall Ability *first 3 days only: None of the above were recalled     Bedside Swallowing Assessment General Respiratory Status: Room air History of Recent Intubation: No Behavior/Cognition: Confused;Agitated;Requires cueing;Distractible Oral Cavity - Dentition: Missing dentition;Poor  condition Self-Feeding Abilities: Needs assist Patient Positioning: Upright in bed Baseline Vocal Quality: Normal Volitional Cough: Cognitively unable to elicit  Oral Care Assessment   Ice Chips Ice chips: Within functional limits Thin Liquid Thin Liquid: Within functional limits Presentation: Straw;Spoon;Cup   Solid Solid: Within functional limits BSE Assessment Risk for Aspiration Impact on safety and function: Mild aspiration risk (due to attention and talking during mastication)  Short Term Goals: Week 1: SLP Short Term Goal 1 (Week 1): Pt  will attend to basic and familiar tasks for 3-4 minutes intervals with mod A verbal cues for redirection. SLP Short Term Goal 2 (Week 1): Pt will tolerate dental soft diet and thin liquids with no overt s/sx of aspiration or penetration on 9/10 trials. SLP Short Term Goal 3 (Week 1): Pt will verbally respond to basic questions regarding wants/needs in 75% of opportunities with mod A verbal cues. SLP Short Term Goal 4 (Week 1): Pt will follow basic one step commands on 75% of opportunities with mod A verbal and visual cues. SLP Short Term Goal 5 (Week 1): Pt will orient to location and medical situation with mod A verbal cues.  Refer to Care Plan for Long Term Goals  Recommendations for other services: None   Discharge Criteria: Patient will be discharged from SLP if patient refuses treatment 3 consecutive times without medical reason, if treatment goals not met, if there is a change in medical status, if patient makes no progress towards goals or if patient is discharged from hospital.  The above assessment, treatment plan, treatment alternatives and goals were discussed and mutually agreed upon: by patient  [place] 09/01/2020, 8:20 AM

## 2020-09-02 ENCOUNTER — Inpatient Hospital Stay (HOSPITAL_COMMUNITY): Payer: Medicaid Other

## 2020-09-02 LAB — CBC WITH DIFFERENTIAL/PLATELET
Abs Immature Granulocytes: 0.02 10*3/uL (ref 0.00–0.07)
Basophils Absolute: 0.1 10*3/uL (ref 0.0–0.1)
Basophils Relative: 1 %
Eosinophils Absolute: 0.1 10*3/uL (ref 0.0–0.5)
Eosinophils Relative: 1 %
HCT: 35.8 % — ABNORMAL LOW (ref 36.0–46.0)
Hemoglobin: 12 g/dL (ref 12.0–15.0)
Immature Granulocytes: 0 %
Lymphocytes Relative: 13 %
Lymphs Abs: 0.8 10*3/uL (ref 0.7–4.0)
MCH: 31 pg (ref 26.0–34.0)
MCHC: 33.5 g/dL (ref 30.0–36.0)
MCV: 92.5 fL (ref 80.0–100.0)
Monocytes Absolute: 0.6 10*3/uL (ref 0.1–1.0)
Monocytes Relative: 9 %
Neutro Abs: 4.7 10*3/uL (ref 1.7–7.7)
Neutrophils Relative %: 76 %
Platelets: 404 10*3/uL — ABNORMAL HIGH (ref 150–400)
RBC: 3.87 MIL/uL (ref 3.87–5.11)
RDW: 11.9 % (ref 11.5–15.5)
WBC: 6.3 10*3/uL (ref 4.0–10.5)
nRBC: 0 % (ref 0.0–0.2)

## 2020-09-02 IMAGING — CR DG CHEST 2V
2 series · 2 of 2 positions shown · non-contrast
Comparison: [DATE]

CLINICAL DATA: Cough, chest pain, shortness of breath

EXAM:
CHEST - 2 VIEW

[chest lat]
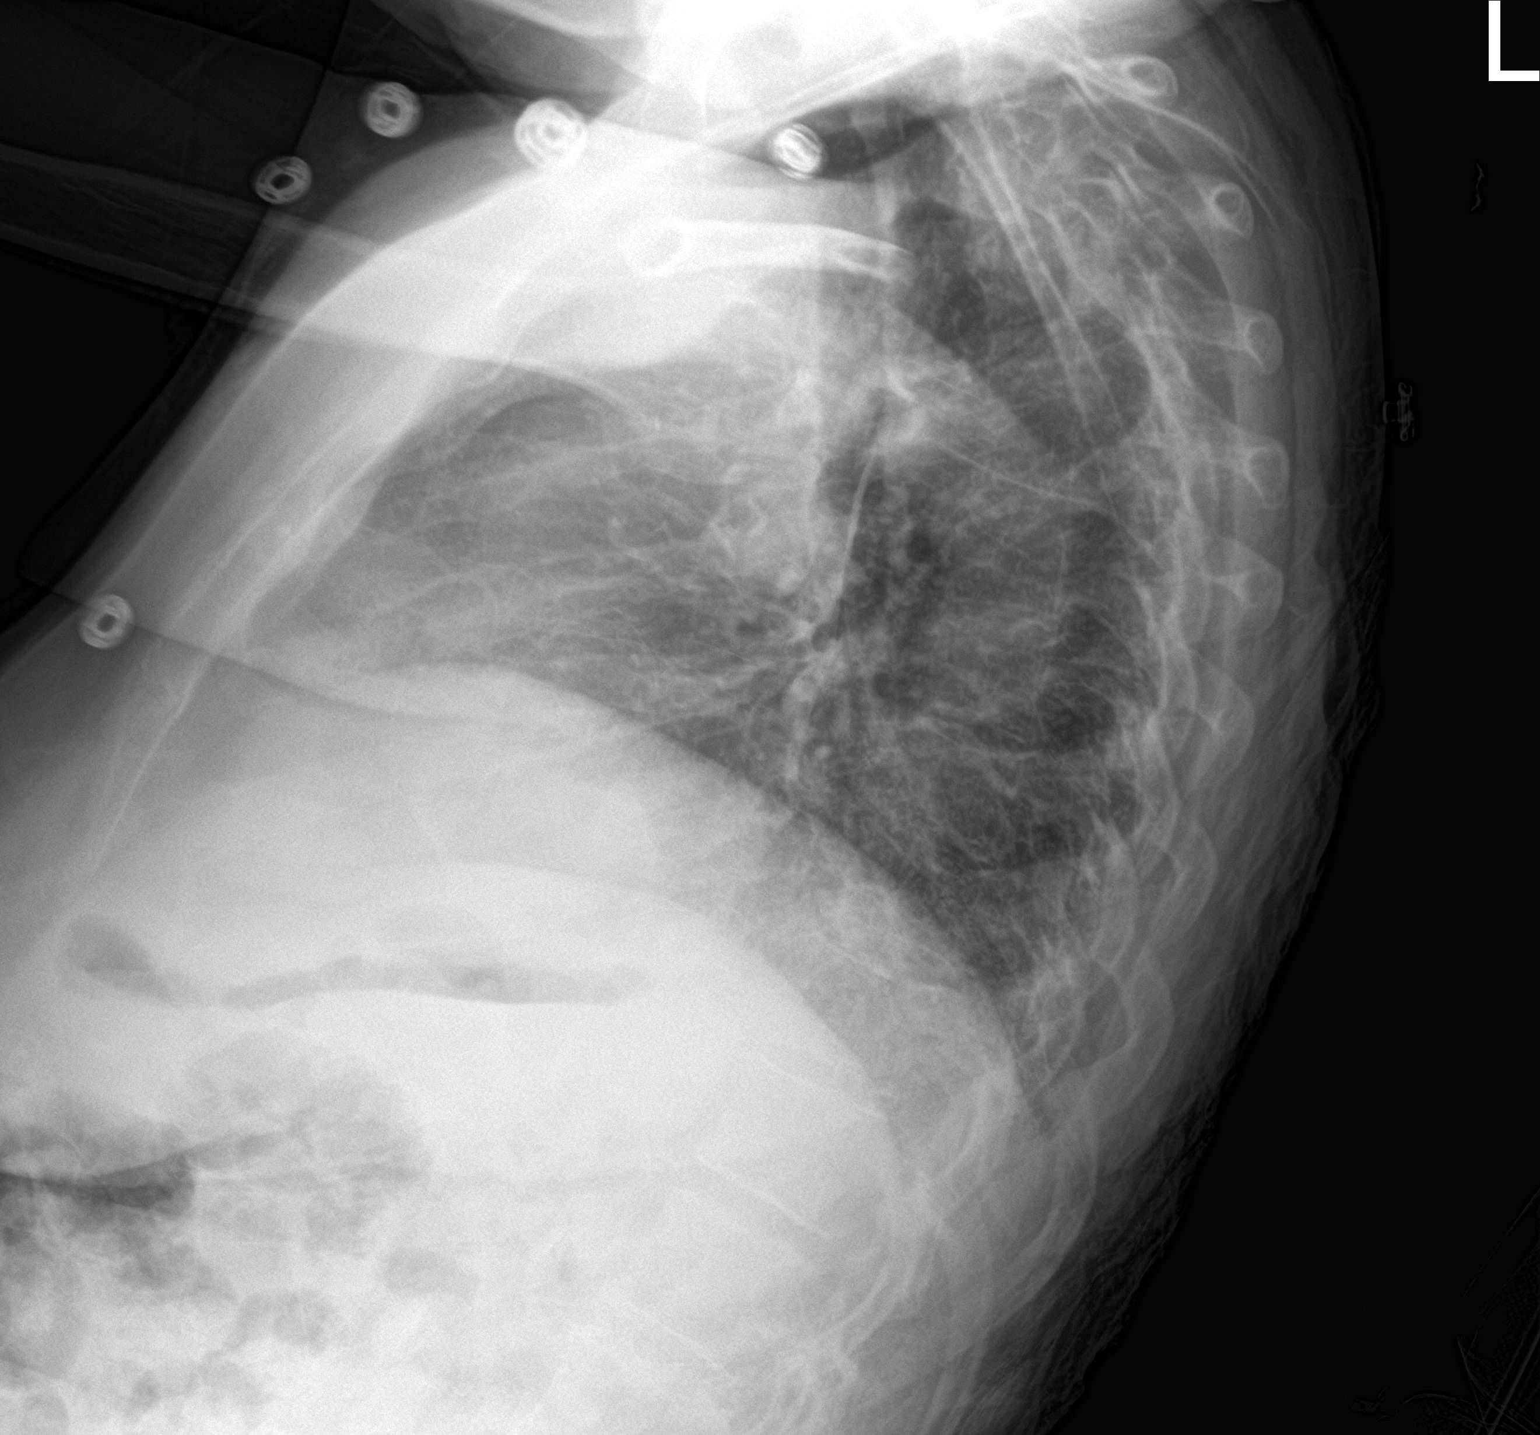

[chest ap]
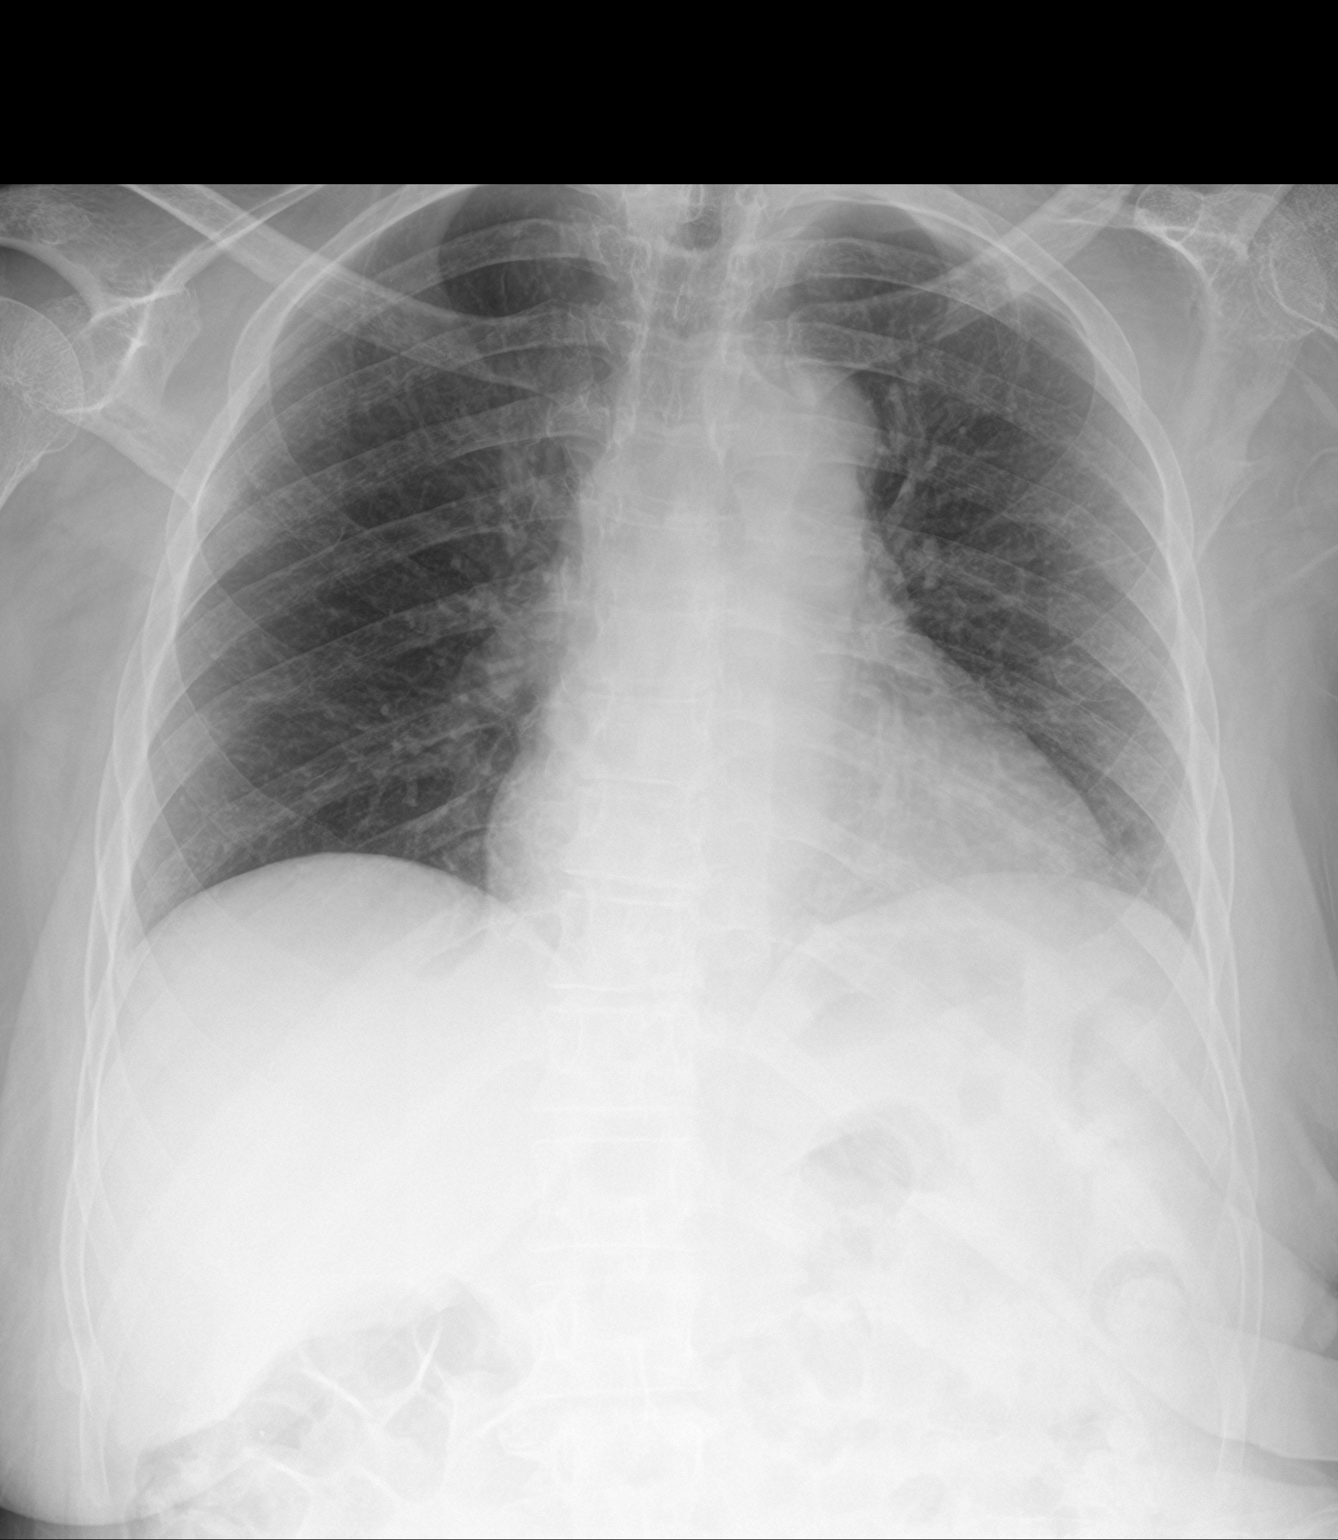

[2 of 2 positions shown; findings below may reference images not displayed]

FINDINGS: The heart size and mediastinal contours are within normal limits.
Both lungs are clear. The visualized skeletal structures are
unremarkable.
IMPRESSION: No active cardiopulmonary disease.

## 2020-09-02 MED ORDER — PROPRANOLOL HCL 10 MG PO TABS
10.0000 mg | ORAL_TABLET | Freq: Every day | ORAL | Status: DC
  Administered 2020-09-03 – 2020-09-04 (×2): 10 mg via ORAL
  Filled 2020-09-02 (×2): qty 1

## 2020-09-02 NOTE — Progress Notes (Signed)
Pt woke up this afternoon upset and agitated pt was given food that consisted of a DYS2 consistency per new diet order and, nectar thick liquid, and afternoon medication tolerated all three well no episodes of coughing.   Cindy [name]

## 2020-09-02 NOTE — Discharge Instructions (Addendum)
Inpatient Rehab Discharge Instructions  [name] Discharge date and time: No discharge date for patient encounter.   Activities/Precautions/ Functional Status: Activity: As tolerated Diet:  Wound Care: Routine skin checks Functional status:  ___ No restrictions     ___ Walk up steps independently ___ 24/7 supervision/assistance   ___ Walk up steps with assistance ___ Intermittent supervision/assistance  ___ Bathe/dress independently ___ Walk with walker     _x__ Bathe/dress with assistance ___ Walk Independently    ___ Shower independently ___ Walk with assistance    ___ Shower with assistance ___ No alcohol     ___ Return to work/school ________   COMMUNITY REFERRALS UPON DISCHARGE:    Home Health:   PT     RN     SNA  SW                [facility]    Phone: [phone] *Please expect start of care on Monday (6/13) for your home visit. If you do not receive follow-up, be sure to contact the branch directly.*   Medical Equipment/Items Ordered: hospital bed and slide board                                                 Agency/Supplier: [employer] [phone]  GENERAL COMMUNITY RESOURCES FOR PATIENT/FAMILY: Approved for 60hrs of PCS services per month. Process takes 30-45 days until a homecare agency has been established to provide care. Please be sure to contact [employer] [phone] to discuss status of referral. Homecare agencies that will be contacted in order: 1) [facility], 2) [employer], and [employer]. *please understand hours may be adjusted once nursing completes their assessment.*  Special Instructions: No driving smoking or alcohol   My questions have been answered and I understand these instructions. I will adhere to these goals and the provided educational materials after my discharge from the hospital.  Patient/Caregiver Signature _______________________________ Date  __________  Clinician Signature _______________________________________ Date __________  Please bring this form and your medication list with you to all your follow-up doctor's appointments.

## 2020-09-02 NOTE — Progress Notes (Signed)
PROGRESS NOTE   Subjective/Complaints: [place] called me regarding patient coughing after breakfast. CXR obtained without sides of aspiration. WBC normal. Downgraded to D2 diet with thick liquids until further SLP session tomorrow.  While awake, she continues to be agitated.   ROS: +agitation as per SLP, +cough after eating breakfast  Objective:   DG Chest 2 View  Result Date: 09/02/2020 CLINICAL DATA:  Cough, chest pain, shortness of breath EXAM: CHEST - 2 VIEW COMPARISON:  08/27/2020 FINDINGS: The heart size and mediastinal contours are within normal limits. Both lungs are clear. The visualized skeletal structures are unremarkable. IMPRESSION: No active cardiopulmonary disease. Electronically Signed   By: Norva Pavlov M.D.   On: 09/02/2020 10:07   Recent Labs    09/02/20 1120  WBC 6.3  HGB 12.0  HCT 35.8*  PLT 404*   No results for input(s): NA, K, CL, CO2, GLUCOSE, BUN, CREATININE, CALCIUM in the last 72 hours.  Intake/Output Summary (Last 24 hours) at 09/02/2020 1427 Last data filed at 09/01/2020 1808 Gross per 24 hour  Intake 320 ml  Output --  Net 320 ml        Physical Exam: Vital Signs Blood pressure (!) 128/95, pulse 90, temperature 98 F (36.7 C), temperature source Oral, resp. rate 19, SpO2 99 %.  Gen: no distress, normal appearing HEENT: oral mucosa pink and moist, without teeth Cardio: Reg rate Chest: normal effort, normal rate of breathing Abd: soft, non-distended Ext: no edema Psych: pleasant, bilateral mittens in place (but noted to be quite agitated at times as per both RN and therapy) Neurological:     Comments: Patient is alert.  Bilateral mittens in place.  She does make eye contact with examiner.  She is easily distracted.  Speech is of low tone but does provide her name and place. Appears to have 4/5 strength throughout; inconsistent in following commands.     Assessment/Plan: 1.  Functional deficits which require 3+ hours per day of interdisciplinary therapy in a comprehensive inpatient rehab setting.  Physiatrist is providing close team supervision and 24 hour management of active medical problems listed below.  Physiatrist and rehab team continue to assess barriers to discharge/monitor patient progress toward functional and medical goals  Care Tool:  Bathing  Bathing activity did not occur: Safety/medical concerns           Bathing assist       Upper Body Dressing/Undressing Upper body dressing Upper body dressing/undressing activity did not occur (including orthotics): Safety/medical concerns What is the patient wearing?: Hospital gown only    Upper body assist Assist Level: Total Assistance - Patient < 25%    Lower Body Dressing/Undressing Lower body dressing      What is the patient wearing?: Incontinence brief     Lower body assist Assist for lower body dressing: Dependent - Patient 0%     Toileting Toileting Toileting Activity did not occur (Clothing management and hygiene only): N/A (no void or bm)  Toileting assist Assist for toileting: Total Assistance - Patient < 25%     Transfers Chair/bed transfer  Transfers assist  Chair/bed transfer activity did not occur: Safety/medical concerns  Locomotion Ambulation   Ambulation assist   Ambulation activity did not occur: Safety/medical concerns          Walk 10 feet activity   Assist  Walk 10 feet activity did not occur: Safety/medical concerns        Walk 50 feet activity   Assist Walk 50 feet with 2 turns activity did not occur: Safety/medical concerns         Walk 150 feet activity   Assist Walk 150 feet activity did not occur: Safety/medical concerns         Walk 10 feet on uneven surface  activity   Assist Walk 10 feet on uneven surfaces activity did not occur: Safety/medical concerns         Wheelchair     Assist Will patient  use wheelchair at discharge?:  (pt may benefit from [facility] assessment however unsafe to attempt this AM)   Wheelchair activity did not occur: Safety/medical concerns         Wheelchair 50 feet with 2 turns activity    Assist    Wheelchair 50 feet with 2 turns activity did not occur: Safety/medical concerns       Wheelchair 150 feet activity     Assist  Wheelchair 150 feet activity did not occur: Safety/medical concerns       Blood pressure (!) 128/95, pulse 90, temperature 98 F (36.7 C), temperature source Oral, resp. rate 19, SpO2 99 %.  Medical Problem List and Plan: 1.  Right side weakness with decreased functional mobility secondary to delirium and acute metabolic encephalopathy due to status epilepticus             -patient may shower             -ELOS/Goals: modA 2-3 weeks             -Continue CIR 2.  Impaired mobility -DVT/anticoagulation: Continue Lovenox             -antiplatelet therapy: Aspirin 325 mg 3. Pain Management: Tylenol as needed 4. Agitation: Seroquel 50 mg TID ordered as agitation impacted ability to participate in therapy after receiving BID. Continue bilateral mittens as needed 5. Neuropsych: This patient is capable of making decisions on her own behalf. 6. Skin/Wound Care: Routine skin checks 7. Fluids/Electrolytes/Nutrition: Routine in and outs with follow-up chemistries 8.  Seizure disorder.  Vimpat 200 mg twice daily, valproic acid 1000 mg twice daily.  EEG negative 9.  Hypertension.  Norvasc 10 mg daily, Toprol-XL 50 mg daily.  Monitor with increased mobility  4/17: BP continues to be elevated. Add propanolol which can also help with agitation.  10.  History of bilateral cerebral convexity infarction frontal region.  CIR 08/19/2019-08/27/2019 11.  Hyperlipidemia.  Continue Lipitor 12.  History of polysubstance abuse.  Urine drug screen positive cocaine.  Counseling 13. AKI: creatinine normal on 4/14, monitor as needed.  14. UTI: UA  positive, Keflex started, repeat UC ordered since with multiple species.  15. Mild oral dysphagia: given coughing after breakfast, downgraded diet to D2 with thickened liquids until continues SLP session tomorrow. CXR negative for aspiration. WBC normal.     LOS: 2 days A FACE TO FACE EVALUATION WAS PERFORMED  Drema Pry Yanil Dawe 09/02/2020, 2:27 PM

## 2020-09-02 NOTE — Progress Notes (Signed)
Pt began having episodes of coughing after having breakfast, nurse followed dietary orders for soft diet, small bites, and full supervision. Pt was able to take medication whole with applesauce but then began coughing after taking small bite of her eggs. Charge nurse made aware of coughing, on call MD made aware. New orders of new diet, and chest xray put in.   Cindy [name]

## 2020-09-03 ENCOUNTER — Other Ambulatory Visit: Payer: Self-pay

## 2020-09-03 ENCOUNTER — Encounter (HOSPITAL_COMMUNITY): Payer: Self-pay | Admitting: Physical Medicine & Rehabilitation

## 2020-09-03 DIAGNOSIS — G40909 Epilepsy, unspecified, not intractable, without status epilepticus: Secondary | ICD-10-CM

## 2020-09-03 DIAGNOSIS — I10 Essential (primary) hypertension: Secondary | ICD-10-CM

## 2020-09-03 DIAGNOSIS — R1311 Dysphagia, oral phase: Secondary | ICD-10-CM

## 2020-09-03 LAB — CBC WITH DIFFERENTIAL/PLATELET
Abs Immature Granulocytes: 0.03 10*3/uL (ref 0.00–0.07)
Basophils Absolute: 0 10*3/uL (ref 0.0–0.1)
Basophils Relative: 0 %
Eosinophils Absolute: 0 10*3/uL (ref 0.0–0.5)
Eosinophils Relative: 1 %
HCT: 35.6 % — ABNORMAL LOW (ref 36.0–46.0)
Hemoglobin: 12.2 g/dL (ref 12.0–15.0)
Immature Granulocytes: 0 %
Lymphocytes Relative: 12 %
Lymphs Abs: 0.9 10*3/uL (ref 0.7–4.0)
MCH: 30.7 pg (ref 26.0–34.0)
MCHC: 34.3 g/dL (ref 30.0–36.0)
MCV: 89.7 fL (ref 80.0–100.0)
Monocytes Absolute: 0.7 10*3/uL (ref 0.1–1.0)
Monocytes Relative: 9 %
Neutro Abs: 6.2 10*3/uL (ref 1.7–7.7)
Neutrophils Relative %: 78 %
Platelets: 436 10*3/uL — ABNORMAL HIGH (ref 150–400)
RBC: 3.97 MIL/uL (ref 3.87–5.11)
RDW: 12 % (ref 11.5–15.5)
WBC: 7.9 10*3/uL (ref 4.0–10.5)
nRBC: 0 % (ref 0.0–0.2)

## 2020-09-03 LAB — COMPREHENSIVE METABOLIC PANEL
ALT: 15 U/L (ref 0–44)
AST: 22 U/L (ref 15–41)
Albumin: 3 g/dL — ABNORMAL LOW (ref 3.5–5.0)
Alkaline Phosphatase: 58 U/L (ref 38–126)
Anion gap: 9 (ref 5–15)
BUN: 21 mg/dL (ref 8–23)
CO2: 23 mmol/L (ref 22–32)
Calcium: 9.7 mg/dL (ref 8.9–10.3)
Chloride: 103 mmol/L (ref 98–111)
Creatinine, Ser: 1.42 mg/dL — ABNORMAL HIGH (ref 0.44–1.00)
GFR, Estimated: 42 mL/min — ABNORMAL LOW (ref >60)
Glucose, Bld: 120 mg/dL — ABNORMAL HIGH (ref 70–99)
Potassium: 3.6 mmol/L (ref 3.5–5.1)
Sodium: 135 mmol/L (ref 135–145)
Total Bilirubin: 0.5 mg/dL (ref 0.3–1.2)
Total Protein: 7.2 g/dL (ref 6.5–8.1)

## 2020-09-03 LAB — VALPROIC ACID LEVEL: Valproic Acid Lvl: 48 ug/mL — ABNORMAL LOW (ref 50.0–100.0)

## 2020-09-03 MED ORDER — ENSURE MAX PROTEIN PO LIQD
11.0000 [oz_av] | Freq: Every day | ORAL | Status: DC
  Administered 2020-09-03: 11 [oz_av] via ORAL

## 2020-09-03 MED ORDER — METHYLPHENIDATE HCL 5 MG PO TABS
5.0000 mg | ORAL_TABLET | Freq: Two times a day (BID) | ORAL | Status: DC
  Administered 2020-09-03 – 2020-09-07 (×8): 5 mg via ORAL
  Filled 2020-09-03 (×8): qty 1

## 2020-09-03 MED ORDER — SORBITOL 70 % SOLN
60.0000 mL | Freq: Every day | Status: DC | PRN
  Administered 2020-09-03 – 2020-10-07 (×7): 60 mL via ORAL
  Filled 2020-09-03 (×10): qty 60

## 2020-09-03 NOTE — Progress Notes (Signed)
Physical Therapy Session Note  Patient Details  Name: Cindy [name] MRN: [id number] Date of Birth: 1960-06-07  Today's Date: 09/03/2020 PT Individual Time: 1230-1325 PT Individual Time Calculation (min): 55 min   Short Term Goals: Week 1:  PT Short Term Goal 1 (Week 1): pt to demonstrated supine<>sit max A x1 PT Short Term Goal 2 (Week 1): pt to demonstrate sitting balance 5 min at mod A x1 PT Short Term Goal 3 (Week 1): pt to demonstrate bed<>chair transfers max A x1 PT Short Term Goal 4 (Week 1): pt to tolerate sitting OOB 1 hour for improved participation with therapy PT Short Term Goal 5 (Week 1): Initiate gait training  Skilled Therapeutic Interventions/Progress Updates:     Patient in bed with NT completing peri-care bed level upon PT arrival. Patient did not indicate or show signs of pain throughout session. Patient remained alert and restless with 1 episode of agitation due to bed level toileting during session. Responded well to calm voice and explanation of what was happening with increased time to process and regulate emotional response. Removed B mitts during session, patient required frequent redirection to keep hands from grabbing at sheets and clothes and to reduced swinging in the air and at staff during agitation.  Therapeutic Activity: Bed Mobility: Patient stated that she needed to void during session. Opted for bed pan due to poor trunk control and standing balance at this time, see below. She was continent of bowl and bladder during session performed rolling R/L x4 with mod-max A of 1-2, able to use R hand to hold bed rail with hand-over-hand assist on second and third trial. Patient was confused by the bed pan and very upset that she was going to void and have a BM "in the bed" reassured the patient that she was on the bed pan several times and educated on goals to progress to [facility]. During agitation patient did grab and remove the bed pan x1, then she allowed PT with a second  assist from [place], [place] who arrived for support with patient's agitation/restlessness, to place the bed pan again at which time she was continent using the bed pan. Required total A +2 for peri-care and donned an incontinence brief, and paper scrub pants bed level with total A. She performed supine to/from sit x1 with max A of 1 person to the L and x1 with max A +2 to the R. Provided verbal cues for sequencing and initiation. Transfers: Patient performed sit to/from stand x1 with max A of 1 person, patient with strong posterior lean in standing required PT to return patient to sitting quickly for patient safety. Provided verbal cues for forward weight shift, blocked B feet to prevent anterior sliding, and provided cues for initiating sitting.  Neuromuscular Re-ed: Patient performed the following activities: -Sitting in chair position in the bed performed feeding with hand-over-hand assist using R hand and a weighted spoon, patient required total A to participate in activity, switched to L hand with the same results, progressed to total A feeding on patients R and L side to assess visual scanning, fixation, and sided attention. Patient with R trunk lean and head turn, gaze remained to the R or at midline, but able to attend to bites provided on the L and scan, as long as they were close to midline. Has significant cervical flexion in sitting, provided facilitation for head to neutral for improved posture with swallowing, patient unable to sustain position on her own. Provided small bites and  increased time and cues for chewing and swallowing each bite before providing the next. -Sitting balance EOB focused on midline orientation to correct strong R lean and forward flexed posture using visual target and manual facilitation, patient did not follow cues provided and demonstrated little participation in activity -donned shirt with max-total A sitting EOB with max-total A for dynamic sitting balance, patient  attempted to place her L arm through the shirt with poor attention and success with this task, required hand over hand assist for B upper extremities to thread through the sleeves  Patient in bed with B mitts donned at end of session with breaks locked, bed alarm set, and all needs within reach.    Therapy Documentation Precautions:  Precautions Precautions: Fall Precaution Comments: bilateral mitts and bedrails padded Restrictions Weight Bearing Restrictions: No    Therapy/Group: Individual Therapy  Reynalda Canny L Charonda Hefter PT, DPT  09/03/2020, 4:57 PM

## 2020-09-03 NOTE — Care Management (Signed)
[facility]patient [facility] Individual Statement of Services  Patient Name:  Cindy [name]  Date:  09/03/2020  Welcome to the [facility]patient [facility].  Our goal is to provide you with an individualized program based on your diagnosis and situation, designed to meet your specific needs.  With this comprehensive rehabilitation program, you will be expected to participate in at least 3 hours of rehabilitation therapies Monday-Friday, with modified therapy programming on the weekends.  Your rehabilitation program will include the following services:  Physical Therapy (PT), Occupational Therapy (OT), Speech Therapy (ST), 24 hour per day rehabilitation nursing, Therapeutic Recreaction (TR), Psychology, Neuropsychology, Care Coordinator, Rehabilitation Medicine, Nutrition Services, Pharmacy Services and Other  Weekly team conferences will be held on Tuesdays to discuss your progress.  Your Inpatient Rehabilitation Care Coordinator will talk with you frequently to get your input and to update you on team discussions.  Team conferences with you and your family in attendance may also be held.  Expected length of stay: 3-3.5 weeks   Overall anticipated outcome: Minimal Assistance  Depending on your progress and recovery, your program may change. Your Inpatient Rehabilitation Care Coordinator will coordinate services and will keep you informed of any changes. Your Inpatient Rehabilitation Care Coordinator's name and contact numbers are listed  below.  The following services may also be recommended but are not provided by the Inpatient Rehabilitation Center:   Driving Evaluations  Home Health Rehabiltiation Services  Outpatient Rehabilitation Services  Vocational Rehabilitation   Arrangements will be made to provide these services after discharge if needed.  Arrangements include referral to agencies that provide these services.  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  Inpatient Rehabilitation Care Coordinator:  Susie Cassette [phone] or ([phone]  Information discussed with and copy given to patient by: Gretchen Short, 09/03/2020, 10:11 AM

## 2020-09-03 NOTE — Progress Notes (Signed)
Occupational Therapy Session Note  Patient Details  Name: Cindy [name] MRN: [id number] Date of Birth: [birth year]  Today's Date: 09/03/2020 OT Individual Time: 1430-1445 OT Individual Time Calculation (min): 15 min  and Today's Date: 09/03/2020 OT Missed Time: 15 Minutes Missed Time Reason: Other (comment) (pt agitated)   Short Term Goals: Week 1:  OT Short Term Goal 1 (Week 1): Pt will demonstrate improved attention to grooming task for >1 min with mod A. OT Short Term Goal 2 (Week 1): Pt will tolerate sitting EOB with mod A for >2 min. OT Short Term Goal 3 (Week 1): Pt will don shirt max A. OT Short Term Goal 4 (Week 1): Pt will self-feed 25% of meal with mod A.  Skilled Therapeutic Interventions/Progress Updates:    Pt supine upon arrival watching TV. Pt turned to look at OT during introduction and nodded appropriately. Attempted initiating bed mobility and pt stating "NO" loudly. Therapeutic use of self throughout session to reduce agitation and promote maximal self regulation. Max HOH face washing completed and tolerated briefly. Pt possibly hallucinating, turning her head to empty spots in room and beginning to converse (intelligible) and randomly shouting "what?" throughout session. Pt began to scream "leave me alone" to any further attempt OT made at conversation. Session terminated to reduce any escalation of agitation. Pt was left supine with B mitts on and all 4 bed rails up, bed alarm on.   Therapy Documentation Precautions:  Precautions Precautions: Fall Precaution Comments: bilateral mitts and bedrails padded Restrictions Weight Bearing Restrictions: No  Therapy/Group: Individual Therapy  Crissie Reese 09/03/2020, 6:40 AM

## 2020-09-03 NOTE — IPOC Note (Signed)
Overall Plan of Care (IPOC) Patient Details Name: Cindy [name] MRN: [id number] DOB: [birth year]  Admitting Diagnosis: Acute metabolic encephalopathy  Hospital Problems: Principal Problem:   Acute metabolic encephalopathy     Functional Problem List: Nursing Behavior,Bladder,Bowel,Endurance,Medication Management,Pain,Safety,Skin Integrity  PT Balance,Safety,Sensory,Behavior,Nutrition,Pain,Skin Integrity,Endurance,Motor  OT Balance,Behavior,Motor,Sensory,Skin Integrity,Cognition,Edema,Endurance,Safety,Perception  SLP Cognition,Linguistic,Safety  TR         Basic ADL's: OT Eating,Grooming,Bathing,Dressing,Toileting     Advanced  ADL's: OT       Transfers: PT Bed Mobility,Bed to Chair,Car  OT Toilet,Tub/Shower     Locomotion: PT Ambulation,Wheelchair Mobility,Stairs     Additional Impairments: OT    SLP Swallowing,Communication,Social Cognition expression,comprehension Social Interaction,Problem Solving,Memory,Attention,Awareness  TR      Anticipated Outcomes Item Anticipated Outcome  Self Feeding min  Swallowing      Basic self-care  mod  Toileting  mod   Bathroom Transfers mod  Bowel/Bladder  min assist  Transfers  min A with LRAD pending progress  Locomotion  min A with LRAD pening progress  Communication     Cognition     Pain  <3  Safety/Judgment  min assist with no falls   Therapy Plan: PT Intensity: Minimum of 1-2 x/day ,45 to 90 minutes PT Frequency: 5 out of 7 days PT Duration Estimated Length of Stay: 3 weeks OT Intensity: Minimum of 1-2 x/day, 45 to 90 minutes OT Frequency: 5 out of 7 days OT Duration/Estimated Length of Stay: 3 to 3.5 weeks SLP Intensity: Minumum of 1-2 x/day, 30 to 90 minutes SLP Frequency: 3 to 5 out of 7 days SLP Duration/Estimated Length of Stay: 12-14 days   Due to the current state of emergency, patients may not be receiving their 3-hours of Medicare-mandated therapy.   Team Interventions: Nursing  Interventions Patient/Family Education,Bladder Management,Bowel Management,Disease Management/Prevention,Pain Management,Medication Management,Skin Care/Wound Management,Discharge Planning,Psychosocial Support  PT interventions Ambulation/gait training,Balance/vestibular training,Cognitive remediation/compensation,Community reintegration,Discharge planning,Disease management/prevention,Functional electrical stimulation,Functional mobility training,DME/adaptive equipment instruction,Neuromuscular re-education,Pain management,Patient/family education,Psychosocial support,Skin care/wound management,Splinting/orthotics,Stair training,Therapeutic Activities,UE/LE Strength taining/ROM,Therapeutic Exercise,UE/LE Coordination activities,Visual/perceptual remediation/compensation,Wheelchair propulsion/positioning  OT Interventions Balance/vestibular training,Community reintegration,Disease mangement/prevention,Functional electrical stimulation,Neuromuscular re-education,Patient/family education,Self Care/advanced ADL retraining,Splinting/orthotics,Therapeutic Exercise,UE/LE Coordination activities,Wheelchair propulsion/positioning,Visual/perceptual remediation/compensation,UE/LE Strength taining/ROM,Therapeutic Activities,Skin care/wound managment,Psychosocial support,Pain management,DME/adaptive equipment instruction,Functional mobility training,Discharge planning,Cognitive remediation/compensation  SLP Interventions Cognitive remediation/compensation,Speech/Language facilitation,Therapeutic Activities,Therapeutic Exercise,Dysphagia/aspiration precaution training,Patient/family education  TR Interventions    SW/CM Interventions Discharge Planning,Psychosocial Support,Patient/Family Education   Barriers to Discharge MD  Medical stability  Nursing Decreased caregiver support,Home environment access/layout,Incontinence,Wound Care,Lack of/limited family support,Medication compliance,Behavior    PT Medication  compliance,Behavior,Nutrition means,Insurance for SNF coverage,Incontinence,Lack of/limited family support pt's current lethargy and command following limiting assessment and function  OT      SLP      SW       Team Discharge Planning: Destination: PT-Home ,OT- Home , SLP-Home Projected Follow-up: PT-Home health PT, OT-  Home health OT,Skilled nursing facility,24 hour supervision/assistance, SLP-Home Health SLP,Outpatient SLP Projected Equipment Needs: PT-To be determined, OT- None recommended by OT, SLP-None recommended by SLP Equipment Details: PT-unknown if pt has DME at home, OT-  Patient/family involved in discharge planning: PT- Patient unable/family or caregiver not available,  OT-Family member/caregiver, SLP-Patient  MD ELOS: 20-24 days Medical Rehab Prognosis:  Fair Assessment: The patient has been admitted for CIR therapies with the diagnosis of encephalopathy. The team will be addressing functional mobility, strength, stamina, balance, safety, adaptive techniques and equipment, self-care, bowel and bladder mgt, patient and caregiver education, NMR, language, communication, community reentry. Goals have been set at min to mod assist with mobility and self-care  and mod assist for cognition and communication.   Due to the current state of emergency, patients may not be receiving their 3 hours per day of Medicare-mandated therapy.    Ranelle Oyster, MD, FAAPMR      See Team Conference Notes for weekly updates to the plan of care

## 2020-09-03 NOTE — Progress Notes (Signed)
Patient ID: Cindy [name], female   DOB: [birth year], 60 y.o.   MRN: [id number]  SW made efforts to complete assessment with pt, however, pt having A/VH in which she was having a conversation with someone who was not in the room.   SW made efforts to contact pt husband Cindy [name] [phone]) to complete assessment, but no answer, and unable to leave a message as voicemail not set up. SW will continue to make efforts to complete, and will continue to assess pt for d/c needs.   [name], MSW, LCSWA Office: [phone] Cell: [phone] Fax: [phone]

## 2020-09-03 NOTE — Progress Notes (Signed)
Occupational Therapy Session Note  Patient Details  Name: Cindy [name] MRN: [id number] Date of Birth: 03-May-1961  Today's Date: 09/03/2020 OT Individual Time: 0800-0910 OT Individual Time Calculation (min): 70 min    Short Term Goals: Week 1:  OT Short Term Goal 1 (Week 1): Pt will demonstrate improved attention to grooming task for >1 min with mod A. OT Short Term Goal 2 (Week 1): Pt will tolerate sitting EOB with mod A for >2 min. OT Short Term Goal 3 (Week 1): Pt will don shirt max A. OT Short Term Goal 4 (Week 1): Pt will self-feed 25% of meal with mod A.  Skilled Therapeutic Interventions/Progress Updates:    1:1. Pt presented in bed with NT providing total A for self feeding. OT takes over and initially pt refuses to self feed requiring total A for scooping food and bringing to mouth. Pt with BUE tremors and pt resistive to [facility] A with RUE for self feeding but eventually with LUE will complete hand to mouth excursion. Worked on backwards chaining of self feeding with LUE to place spoon back on plate after a bite, to pull spoon from mouth and eventually intiate grabbing spoon from plate with food loaded and bring to mouth all with  Guiding A. Pt very restless, internally/externally distracted and perseverative on "a job." Pt only oriented to self and very dysarthric throughout session. OT required to turn off tx as well as remove towel from over gown and blanket off of pt as this was distracting from self feeding. Attempted UB dressing but pt becoming agitated with A to undress therefore terminated attempts and pt calms. OT retrieves weighted spoon and red foam handle for alternative trials of feeding. Exited session with pt seated in bed, exit alarm on and call light in reach   Therapy Documentation Precautions:  Precautions Precautions: Fall Precaution Comments: bilateral mitts and bedrails padded Restrictions Weight Bearing Restrictions: No General:   Vital Signs: Therapy  Vitals Temp: 98 F (36.7 C) Pulse Rate: 97 Resp: 18 BP: (!) 158/77 Patient Position (if appropriate): Lying Oxygen Therapy SpO2: 100 % Pain:   ADL: ADL Eating: Unable to assess Grooming: Unable to assess Upper Body Bathing: Unable to assess Lower Body Bathing: Unable to assess Upper Body Dressing: Unable to assess Lower Body Dressing: Dependent Where Assessed-Lower Body Dressing: Bed level Toileting: Unable to assess Toilet Transfer: Unable to assess Tub/Shower Transfer: Unable to assess Psychologist, counselling Transfer: Unable to assess Vision   Perception    Praxis   Exercises:   Other Treatments:     Therapy/Group: Individual Therapy  Shon Hale 09/03/2020, 7:24 AM

## 2020-09-03 NOTE — Progress Notes (Signed)
PROGRESS NOTE   Subjective/Complaints: Pt with OT eating breakfast. Very distracted. Non agitated this morning but agitated over weekend  ROS: Limited due to cognitive/behavioral    Objective:   DG Chest 2 View  Result Date: 09/02/2020 CLINICAL DATA:  Cough, chest pain, shortness of breath EXAM: CHEST - 2 VIEW COMPARISON:  08/27/2020 FINDINGS: The heart size and mediastinal contours are within normal limits. Both lungs are clear. The visualized skeletal structures are unremarkable. IMPRESSION: No active cardiopulmonary disease. Electronically Signed   By: Norva Pavlov M.D.   On: 09/02/2020 10:07   Recent Labs    09/02/20 1120 09/03/20 0631  WBC 6.3 7.9  HGB 12.0 12.2  HCT 35.8* 35.6*  PLT 404* 436*   Recent Labs    09/03/20 0631  NA 135  K 3.6  CL 103  CO2 23  GLUCOSE 120*  BUN 21  CREATININE 1.42*  CALCIUM 9.7    Intake/Output Summary (Last 24 hours) at 09/03/2020 1012 Last data filed at 09/03/2020 0900 Gross per 24 hour  Intake 520 ml  Output --  Net 520 ml        Physical Exam: Vital Signs Blood pressure (!) 158/77, pulse 97, temperature 98 F (36.7 C), resp. rate 18, SpO2 100 %.  Constitutional: No distress . Vital signs reviewed. HEENT: EOMI, oral membranes moist Neck: supple Cardiovascular: RRR without murmur. No JVD    Respiratory/Chest: CTA Bilaterally without wheezes or rales. Normal effort    GI/Abdomen: BS +, non-tender, non-distended Ext: no clubbing, cyanosis, or edema Psych: agitated and restless.  Neurological:     Comments: Patient is alert. Distracted, dysarthric, ?aphasic also  frequentloy mumbles. Would not follow basic commands for me. Attempted to hold fork for feeding with OT. Appears to have 4/5 strength throughout.     Assessment/Plan: 1. Functional deficits which require 3+ hours per day of interdisciplinary therapy in a comprehensive inpatient rehab  setting.  Physiatrist is providing close team supervision and 24 hour management of active medical problems listed below.  Physiatrist and rehab team continue to assess barriers to discharge/monitor patient progress toward functional and medical goals  Care Tool:  Bathing  Bathing activity did not occur: Safety/medical concerns     Body parts bathed by helper: Right arm,Left arm,Chest,Abdomen,Front perineal area,Buttocks,Right upper leg,Left upper leg,Left lower leg,Right lower leg,Face     Bathing assist Assist Level: Total Assistance - Patient < 25%     Upper Body Dressing/Undressing Upper body dressing Upper body dressing/undressing activity did not occur (including orthotics): Safety/medical concerns What is the patient wearing?: Hospital gown only    Upper body assist Assist Level: Total Assistance - Patient < 25%    Lower Body Dressing/Undressing Lower body dressing      What is the patient wearing?: Incontinence brief     Lower body assist Assist for lower body dressing: Dependent - Patient 0%     Toileting Toileting Toileting Activity did not occur (Clothing management and hygiene only): N/A (no void or bm)  Toileting assist Assist for toileting: Dependent - Patient 0% (incontinent)     Transfers Chair/bed transfer  Transfers assist  Chair/bed transfer activity did not occur: Safety/medical concerns  Locomotion Ambulation   Ambulation assist   Ambulation activity did not occur: Safety/medical concerns          Walk 10 feet activity   Assist  Walk 10 feet activity did not occur: Safety/medical concerns        Walk 50 feet activity   Assist Walk 50 feet with 2 turns activity did not occur: Safety/medical concerns         Walk 150 feet activity   Assist Walk 150 feet activity did not occur: Safety/medical concerns         Walk 10 feet on uneven surface  activity   Assist Walk 10 feet on uneven surfaces activity did  not occur: Safety/medical concerns         Wheelchair     Assist Will patient use wheelchair at discharge?: Yes (pt may benefit from [facility] assessment however unsafe to attempt this AM; per PT long term goals)   Wheelchair activity did not occur: Safety/medical concerns         Wheelchair 50 feet with 2 turns activity    Assist    Wheelchair 50 feet with 2 turns activity did not occur: Safety/medical concerns       Wheelchair 150 feet activity     Assist  Wheelchair 150 feet activity did not occur: Safety/medical concerns       Blood pressure (!) 158/77, pulse 97, temperature 98 F (36.7 C), resp. rate 18, SpO2 100 %.  Medical Problem List and Plan: 1.  Right side weakness with decreased functional mobility secondary to delirium and acute metabolic encephalopathy due to status epilepticus             -patient may shower             -ELOS/Goals: mod A 2-3 weeks+             -Continue CIR PT, OT, SLP  -suspect there were some significant baseline deficits--d/w family 2.  Impaired mobility -DVT/anticoagulation: Continue Lovenox             -antiplatelet therapy: Aspirin 325 mg 3. Pain Management: Tylenol as needed 4. Agitation/severely distracted: Antipsychotic: Seroquel 50 mg TID ordered as agitation impacted ability to participate in therapy.  -continue propranolol  - Continue bilateral mittens as needed  -will try ritalin to see if it improves above 5. Neuropsych: This patient is capable of making decisions on her own behalf. 6. Skin/Wound Care: Routine skin checks 7. Fluids/Electrolytes/Nutrition:    -I personally reviewed the patient's labs today.    4/18 bun/cr trending up-encourage fluids  -protein supps for low albumin 8.  Seizure disorder.  Vimpat 200 mg twice daily, valproic acid 1000 mg twice daily.  EEG negative 9.  Hypertension.  Norvasc 10 mg daily, Toprol-XL 50 mg daily.  Monitor with increased mobility  4/17: BP continues to be elevated.  Added propanolol which can also help with agitation.  10.  History of bilateral cerebral convexity infarction frontal region.  CIR 08/19/2019-08/27/2019 11.  Hyperlipidemia.  Continue Lipitor 12.  History of polysubstance abuse.  Urine drug screen positive cocaine.  Counseling 13. AKI: creatinine normal on 4/14. Some elevation d/t prerenal  14. UTI: UA positive, Keflex started, repeat UC ordered since with multiple species.  15. Mild oral dysphagia:  downgraded diet to D2 with thickened liquids give coughing spell this weekend  -cxr ok, afebrile   LOS: 3 days A FACE TO FACE EVALUATION WAS PERFORMED  Ranelle Oyster 09/03/2020, 10:12 AM

## 2020-09-04 LAB — CBC
HCT: 38.4 % (ref 36.0–46.0)
Hemoglobin: 12.6 g/dL (ref 12.0–15.0)
MCH: 30.2 pg (ref 26.0–34.0)
MCHC: 32.8 g/dL (ref 30.0–36.0)
MCV: 92.1 fL (ref 80.0–100.0)
Platelets: 507 10*3/uL — ABNORMAL HIGH (ref 150–400)
RBC: 4.17 MIL/uL (ref 3.87–5.11)
RDW: 12.1 % (ref 11.5–15.5)
WBC: 7.8 10*3/uL (ref 4.0–10.5)
nRBC: 0 % (ref 0.0–0.2)

## 2020-09-04 LAB — URINALYSIS, COMPLETE (UACMP) WITH MICROSCOPIC
Bacteria, UA: NONE SEEN
Bilirubin Urine: NEGATIVE
Glucose, UA: NEGATIVE mg/dL
Hgb urine dipstick: NEGATIVE
Ketones, ur: NEGATIVE mg/dL
Leukocytes,Ua: NEGATIVE
Nitrite: NEGATIVE
Protein, ur: 30 mg/dL — AB
Specific Gravity, Urine: 1.029 (ref 1.005–1.030)
pH: 5 (ref 5.0–8.0)

## 2020-09-04 LAB — BASIC METABOLIC PANEL
Anion gap: 9 (ref 5–15)
BUN: 20 mg/dL (ref 8–23)
CO2: 24 mmol/L (ref 22–32)
Calcium: 9.7 mg/dL (ref 8.9–10.3)
Chloride: 103 mmol/L (ref 98–111)
Creatinine, Ser: 1.29 mg/dL — ABNORMAL HIGH (ref 0.44–1.00)
GFR, Estimated: 47 mL/min — ABNORMAL LOW (ref >60)
Glucose, Bld: 110 mg/dL — ABNORMAL HIGH (ref 70–99)
Potassium: 3.9 mmol/L (ref 3.5–5.1)
Sodium: 136 mmol/L (ref 135–145)

## 2020-09-04 MED ORDER — SODIUM CHLORIDE 0.45 % IV SOLN: INTRAVENOUS | Status: DC

## 2020-09-04 MED ORDER — QUETIAPINE FUMARATE 25 MG PO TABS
25.0000 mg | ORAL_TABLET | Freq: Three times a day (TID) | ORAL | Status: DC
  Administered 2020-09-04 – 2020-09-07 (×8): 25 mg via ORAL
  Filled 2020-09-04 (×8): qty 1

## 2020-09-04 MED ORDER — METOPROLOL TARTRATE 25 MG PO TABS
25.0000 mg | ORAL_TABLET | Freq: Two times a day (BID) | ORAL | Status: DC
  Administered 2020-09-04 – 2020-10-27 (×107): 25 mg via ORAL
  Filled 2020-09-04 (×107): qty 1

## 2020-09-04 MED ORDER — LORAZEPAM 1 MG PO TABS
0.5000 mg | ORAL_TABLET | Freq: Once | ORAL | Status: AC
  Administered 2020-09-04: 0.5 mg via ORAL
  Filled 2020-09-04: qty 1

## 2020-09-04 NOTE — Progress Notes (Signed)
PROGRESS NOTE   Subjective/Complaints: Pt with SLP. Very lethargic, eyes closed and curled up in bed.   ROS: Limited due to cognitive/behavioral   Objective:   No results found. Recent Labs    09/02/20 1120 09/03/20 0631  WBC 6.3 7.9  HGB 12.0 12.2  HCT 35.8* 35.6*  PLT 404* 436*   Recent Labs    09/03/20 0631  NA 135  K 3.6  CL 103  CO2 23  GLUCOSE 120*  BUN 21  CREATININE 1.42*  CALCIUM 9.7    Intake/Output Summary (Last 24 hours) at 09/04/2020 1219 Last data filed at 09/04/2020 0845 Gross per 24 hour  Intake 340 ml  Output --  Net 340 ml        Physical Exam: Vital Signs Blood pressure 139/86, pulse 82, temperature 98.1 F (36.7 C), temperature source Oral, resp. rate 15, height 5\' 7"  (1.702 m), SpO2 98 %.  Constitutional: No distress . Vital signs reviewed. HEENT: EOMI, oral membranes moist Neck: supple Cardiovascular: RRR without murmur. No JVD    Respiratory/Chest: CTA Bilaterally without wheezes or rales. Normal effort    GI/Abdomen: BS +, non-tender, non-distended Ext: no clubbing, cyanosis, or edema Psych: lethargic Neurological:     lethargic, dysarthric, ?aphasic. Would not follow basic commands for me. Attempted to hold fork for feeding with OT. moved all 4's.     Assessment/Plan: 1. Functional deficits which require 3+ hours per day of interdisciplinary therapy in a comprehensive inpatient rehab setting.  Physiatrist is providing close team supervision and 24 hour management of active medical problems listed below.  Physiatrist and rehab team continue to assess barriers to discharge/monitor patient progress toward functional and medical goals  Care Tool:  Bathing  Bathing activity did not occur: Safety/medical concerns     Body parts bathed by helper: Right arm,Left arm,Chest,Abdomen,Front perineal area,Buttocks,Right upper leg,Left upper leg,Left lower leg,Right lower  leg,Face     Bathing assist Assist Level: Total Assistance - Patient < 25%     Upper Body Dressing/Undressing Upper body dressing Upper body dressing/undressing activity did not occur (including orthotics): Safety/medical concerns What is the patient wearing?: Hospital gown only    Upper body assist Assist Level: Total Assistance - Patient < 25%    Lower Body Dressing/Undressing Lower body dressing      What is the patient wearing?: Incontinence brief     Lower body assist Assist for lower body dressing: Dependent - Patient 0%     Toileting Toileting Toileting Activity did not occur (Clothing management and hygiene only): N/A (no void or bm)  Toileting assist Assist for toileting: Dependent - Patient 0% (incontinent)     Transfers Chair/bed transfer  Transfers assist  Chair/bed transfer activity did not occur: Safety/medical concerns        Locomotion Ambulation   Ambulation assist   Ambulation activity did not occur: Safety/medical concerns          Walk 10 feet activity   Assist  Walk 10 feet activity did not occur: Safety/medical concerns        Walk 50 feet activity   Assist Walk 50 feet with 2 turns activity did not occur: Safety/medical concerns  Walk 150 feet activity   Assist Walk 150 feet activity did not occur: Safety/medical concerns         Walk 10 feet on uneven surface  activity   Assist Walk 10 feet on uneven surfaces activity did not occur: Safety/medical concerns         Wheelchair     Assist Will patient use wheelchair at discharge?: Yes (pt may benefit from [facility] assessment however unsafe to attempt this AM; per PT long term goals)   Wheelchair activity did not occur: Safety/medical concerns         Wheelchair 50 feet with 2 turns activity    Assist    Wheelchair 50 feet with 2 turns activity did not occur: Safety/medical concerns       Wheelchair 150 feet activity     Assist   Wheelchair 150 feet activity did not occur: Safety/medical concerns       Blood pressure 139/86, pulse 82, temperature 98.1 F (36.7 C), temperature source Oral, resp. rate 15, height 5\' 7"  (1.702 m), SpO2 98 %.  Medical Problem List and Plan: 1.  Right side weakness with decreased functional mobility secondary to delirium and acute metabolic encephalopathy due to status epilepticus             -patient may shower             -ELOS/Goals: mod A 2-3 weeks+             -Continue CIR PT, OT, SLP  -suspect there were some significant baseline deficits. Advanced WM changes on MRI 4/12 likely related to SVD. Pt + for cocaine on admit. 2.  Impaired mobility -DVT/anticoagulation: Continue Lovenox             -antiplatelet therapy: Aspirin 325 mg 3. Pain Management: Tylenol as needed 4. Agitation/severely distracted: Antipsychotic: Seroquel 50 mg TID ordered as agitation impacted ability to participate in therapy.  -continue propranolol  - Continue bilateral mittens as needed  -4/19 very lethargic   -reduce seroquel, stop inderal   -check urine again   -check labs today 5. Neuropsych: This patient is capable of making decisions on her own behalf. 6. Skin/Wound Care: Routine skin checks 7. Fluids/Electrolytes/Nutrition:    -I personally reviewed the patient's labs today.    4/18 bun/cr trending up-recheck today, encourage fluids   -begin IV NS today  -protein supps for low albumin 8.  Seizure disorder.  Vimpat 200 mg twice daily, valproic acid 1000 mg twice daily.  EEG negative 9.  Hypertension.  Norvasc 10 mg daily, Toprol-XL 50 mg daily.  Monitor with increased mobility  4/19. Change toprol xl to lopressor 25mg  bid, starting today, titrate further as needed 10.  History of bilateral cerebral convexity infarction frontal region.  CIR 08/19/2019-08/27/2019 11.  Hyperlipidemia.  Continue Lipitor 12.  History of polysubstance abuse.  Urine drug screen positive cocaine.  Counseling 13. AKI:  creatinine normal on 4/14. Some elevation d/t prerenal  14. UTI: UCX never performed. Continue keflex for now   -recollect urine sample  15. Dysphagia:  downgraded diet to D2 with thickened liquids give coughing spell this weekend  -cxr ok, afebrile   -4/19 diet downgraded to D1, thins  LOS: 4 days A FACE TO FACE EVALUATION WAS PERFORMED  5/14 09/04/2020, 12:19 PM

## 2020-09-04 NOTE — Progress Notes (Signed)
Patient ID: STINA GANE, female   DOB: 1960-07-24, 60 y.o.   MRN: [id number]  SW met with pt and pt husband [name] in room. Pt continues to have hallucinations in which she is talking to people who are not in the room. SW provided pt husband with updates from team conference, and ELOS 3 weeks. SW discused with husband if he is able to provide care to pt. He states he will do the best he can. When discussing support. Through conversation, SW aware husband is a Actor. Goes to [place]. With his permission, he has given SW the ability to explore if any options available. He states he will be the primary caregiver. He does not drive, so he gets rides from his son when he is able to bring him before he goes to work. SW informed will continue to provide updates. SW completed assessment with husband. See assessment for details.   SW spoke with Brianna/[facility] [phone] ext [phone]) who reported pt husband is not service connected. Encourages him to call her if questions, and also make contact with [facility] [phone]) to discuss filing a compensation claim for disability.   Loralee Pacas, MSW, [place] Office: [phone] Cell: [phone] Fax: [phone]

## 2020-09-04 NOTE — Progress Notes (Signed)
Speech Language Pathology Daily Session Note  Patient Details  Name: Cindy [name] MRN: [id number] Date of Birth: 1960/09/27  Today's Date: 09/04/2020 SLP Individual Time: 8588-5027 SLP Individual Time Calculation (min): 30 min and Today's Date: 09/04/2020 SLP Missed Time: 15 Minutes Missed Time Reason: Patient fatigue  Short Term Goals: Week 1: SLP Short Term Goal 1 (Week 1): Pt will attend to basic and familiar tasks for 3-4 minutes intervals with mod A verbal cues for redirection. SLP Short Term Goal 2 (Week 1): Pt will tolerate dental soft diet and thin liquids with no overt s/sx of aspiration or penetration on 9/10 trials. SLP Short Term Goal 3 (Week 1): Pt will verbally respond to basic questions regarding wants/needs in 75% of opportunities with mod A verbal cues. SLP Short Term Goal 4 (Week 1): Pt will follow basic one step commands on 75% of opportunities with mod A verbal and visual cues. SLP Short Term Goal 5 (Week 1): Pt will orient to location and medical situation with mod A verbal cues.  Skilled Therapeutic Interventions: Skilled treatment session focused on cognitive and dysphagia goals. Upon arrival, patient appeared lethargic. Patient with minimal responses to questions regarding wants/needs for tray set-up and when patient did respond, responses were unintelligible. SLP attempted to donn the patient's dentures without success due to severe cognitive deficits and confusion. Patient consumed several sips of thin liquids via straw without overt s/s of aspiration. Patient also consumed pureed textures with prolonged AP transit and Max verbal cues for awareness of bolus. No overt s/s of aspiration noted. Patient with minimal PO intake due to fatigue and lethargy with patient becoming minimally responsive and falling asleep. Despite Max A multimodal cues, patient unable to focus attention to her name. Due to this, session ended early. Recommend patient downgrade to Dys. 1 textures and  continue thin liquids. Patient left reclined in bed with alarm on and all needs within reach. Continue with current plan of care.      Pain Pain Assessment Pain Scale: 0-10 Pain Score: 0-No pain  Therapy/Group: Individual Therapy  Yukiko Minnich 09/04/2020, 11:05 AM

## 2020-09-04 NOTE — Patient Care Conference (Signed)
Inpatient RehabilitationTeam Conference and Plan of Care Update Date: 09/04/2020   Time: 10:16 AM    Patient Name: Cindy [name]      Medical Record Number: [id number]  Date of Birth: [birth year] Sex: Female         Room/Bed: 4W08C/4W08C-01 Payor Info: Payor: MEDICAID [place] / Plan: MEDICAID OF [place] / Product Type: *No Product type* /    Admit Date/Time:  08/31/2020  5:12 PM  Primary Diagnosis:  Acute metabolic encephalopathy  Hospital Problems: Principal Problem:   Acute metabolic encephalopathy    Expected Discharge Date: Expected Discharge Date:  (2-3 weeks)  Team Members Present: Physician leading conference: Dr. Faith Rogue Care Coodinator Present: [name], LCSWA;Aljean Horiuchi Marlyne Beards, RN, BSN, CRRN Nurse Present: Kennyth Arnold, RN PT Present: Serina Cowper, PT OT Present: Jake Shark, OT SLP Present: Feliberto Gottron, SLP PPS Coordinator present : Fae Pippin, SLP     Current Status/Progress Goal Weekly Team Focus  Bowel/Bladder   Patient is incontinent of bladder/bowel, LBM 09/03/20  Restore continence of bladder/bowel  Iniate Time toileting Q 2 hrs and prn, assist in regulating bladder/bower patterns   Swallow/Nutrition/ Hydration   Dys. 1 textures with thin liquids, Max A  Supervision  tolerance of current diet, use of swallow strategies   ADL's   Total A overall, poor initiation, agitation, hallucinating  min-mod A overall  initiation, ADLs, functional engagement/participation in any task   Mobility   Max A of 1-2 overall  Min A, gait 75 ft, will likely downgrade to mod A pending progress  Functional mobility, activity tolerance, behavior managment, attention, safety awareness, midline orientation, balance, sitting tolerance   Communication   Max A  Min A  use of speech intelligibility strategies   Safety/Cognition/ Behavioral Observations  Total A  Mod A  attention, orientation, awareness, problem solving   Pain   No c/o pain or dicomfort  <=3  QS/PRN  assessment   Skin   Skin intact, ecchymosis  to arms and legs           Discharge Planning:  To be assessed; unable to complete assessment with pt due to A/VH, and unable to make contact with family. Per EMR, pt to discharge to home with her husband who will provide 24/7 care.   Team Discussion: Patient has the appearance of a highly impaired, highly cognitively impaired person. Working on weaning some medications to determine if medication related or behavioral. Will try stimulant for arousal. Nursing reports incontinent B/B, unable to determine if patient is in pain or discomfort. Patient is reported to be hallucinating, but no sleep issues or skin issues reported. No education started at this time. Social work reports unable to reach spouse at this time.   Patient on target to meet rehab goals: no, total assist, poor initiation, definitely having auditory hallucinations. Min to mod assist goals. Max/total assist, min assist goals, walking 75 ft. Downgrading goals. SLP downgraded diet to Dys 1, thin liquids. Patient can't stay awake long enough to consume a meal and she is max assist.   *See Care Plan and progress notes for long and short-term goals.   Revisions to Treatment Plan:  Adjusting medications.  Teaching Needs: Family education, medication management, pain management, anxiety management, dietary management, skin/wound care, incontinence management, transfer training, gait training, balance training, endurance training, stair training, safety awareness, sitting tolerance.  Current Barriers to Discharge: Decreased caregiver support, Medical stability, Home enviroment access/layout, Incontinence, Lack of/limited family support, Insurance for SNF coverage, Medication compliance, Behavior  and Nutritional means  Possible Resolutions to Barriers: Continue current medications, offer nutritional supplements, provide emotional support.     Medical Summary Current Status: sz d/o,  encephalopathy. very confused, lethargic, distracted. old cva, now dysphagic d/t arousal level  Barriers to Discharge: Medical stability   Possible Resolutions to [employer] Focus: adjusting medication regimen, stimulant for arousal. assess for metabolic/infectious causes of presentation   Continued Need for Acute Rehabilitation Level of Care: The patient requires daily medical management by a physician with specialized training in physical medicine and rehabilitation for the following reasons: Direction of a multidisciplinary physical rehabilitation program to maximize functional independence : Yes Medical management of patient stability for increased activity during participation in an intensive rehabilitation regime.: Yes Analysis of laboratory values and/or radiology reports with any subsequent need for medication adjustment and/or medical intervention. : Yes   I attest that I was present, lead the team conference, and concur with the assessment and plan of the team.   Tennis Must 09/04/2020, 2:24 PM

## 2020-09-04 NOTE — Progress Notes (Signed)
Initial Nutrition Assessment  DOCUMENTATION CODES:   Not applicable  INTERVENTION:   Obtain new weight  Ensure Enlive po TID, each supplement provides 350 kcal and 20 grams of protein  Magic cup TID with meals, each supplement provides 290 kcal and 9 grams of protein  MVI with minerals daily  NUTRITION DIAGNOSIS:   Increased nutrient needs related to other (see comment) (therapies) as evidenced by estimated needs.    GOAL:   Patient will meet greater than or equal to 90% of their needs    MONITOR:   PO intake,Supplement acceptance,Weight trends,Labs,I & O's,Diet advancement  REASON FOR ASSESSMENT:   Malnutrition Screening Tool    ASSESSMENT:    Pt with a PMH significant for HTN, HLD, bilateral cerebral convexity infarct frontal region receiving CIR 08/19/2019-08/27/2019, seizure with prior history of status epilepticus, and polysubstance abuse presented 08/23/2020 with AMS/slurred speech, R-sided weakness as well as reports of shaking and jerking episodes lasting 5-10 minutes.  Cranial CT/MRI scan was performed and was negative for acute changes but did show remote lacunar infarct. EEG suggestive of cortical dysfunction arising from left hemisphere likely 2/2 underlying structural abnormality post ictal state no seizure or definite epileptiform discharges. Pt with intermittent bouts of agitation/restlessness, currently in bilateral mittens. Admitted to CIR 4/15.   4/15 pt on soft diet 4/17 diet downgraded to dysphagia 2 with thin liquids 4/19 diet downgraded to dysphagia 1 with thin liquids  MD notes pt was lethargic, dysarthric, and questionably aphasic and would not follow commands.   Pt unavailable at time of RD visit; provider in room (not a scheduled therapy). Discussed pt with RN who reports pt has been very agitated.   Pt has -- for the most part -- had a good appetite since being admitted to CIR. Last 8 meal completions charted as 0-100% (56% average meal intake).  Pt with orders for Ensure TID and is consuming well per RN.   Pt has not had weight obtained since 08/23/20 (56.6 kg). Previous weight from 02/18/20 (61.2 kg). This indicates a 7.5% wt loss x 6 months, which is insignificant for timeframe. However, pt needs new wt to truly assess wt history.   No UOP documented x24 hours  I/O: + 1663ml since admit  Medications: folvite, mvi, protonix, thiamine IVF: 1/2 NS @ 26ml/hr Labs: Cr 1.29 (H, lower than yesterday)  NUTRITION - FOCUSED PHYSICAL EXAM: Prior to CIR admission, pt met criteria for malnutrition and was found to have both mild and moderate fat and muscle depletions. Pt likely still meets criteria for malnutrition; however, unable to diagnose at this time without follow-up NFPE. Will attempt at follow-up.   Diet Order:   Diet Order            DIET - DYS 1 Room service appropriate? Yes; Fluid consistency: Thin  Diet effective now                 EDUCATION NEEDS:   No education needs have been identified at this time  Skin:  Skin Assessment: Reviewed RN Assessment  Last BM:  4/18 type 6  Height:   Ht Readings from Last 1 Encounters:  09/03/20 $RemoveB'5\' 7"'FOnUtTvp$  (1.702 m)    Weight:   Wt Readings from Last 1 Encounters:  08/23/20 56.6 kg    BMI:  Body mass index is 19.54 kg/m.  Estimated Nutritional Needs:   Kcal:  1800-2000  Protein:  90-100 grams  Fluid:  >1.8L    Larkin Ina, MS, RD, LDN RD  pager number and weekend/on-call pager number located in [place].

## 2020-09-04 NOTE — Progress Notes (Signed)
Occupational Therapy Session Note  Patient Details  Name: Cindy [name] MRN: [id number] Date of Birth: 03/24/1961  Today's Date: 09/04/2020 OT Individual Time: 6063-0160 OT Individual Time Calculation (min): 60 min    Short Term Goals: Week 1:  OT Short Term Goal 1 (Week 1): Pt will demonstrate improved attention to grooming task for >1 min with mod A. OT Short Term Goal 2 (Week 1): Pt will tolerate sitting EOB with mod A for >2 min. OT Short Term Goal 3 (Week 1): Pt will don shirt max A. OT Short Term Goal 4 (Week 1): Pt will self-feed 25% of meal with mod A.  Skilled Therapeutic Interventions/Progress Updates:    Pt received supine with NT assisting with lunch consumption. Pt no longer attending to feeding and oral holding. Attempted to get pt to clear oral cavity herself, she was unable so suction was used to dependently clean mouth. Pt incredibly restless and internally distracted the first 25 min of session. She was moving every limb and her trunk/head constantly. Allowed pt to continue moving safely and checked for any possible irritants. Brief was clean and no obvious s/s of injury/pain. Pt completed UB bathing and dressing with total A. +2 present and required for safety of pt and therapist. Pt eventually did calm down and her restlessness reduced dramatically. She was transferred to EOB with total A. Pt in forward flexed posture and unable to maintain sitting balance without max A to maintain. Head all the way flexed forward and pt unable to lift into extension without max facilitation. Pt was returned to supine d/t lack of +2 making any EOB task difficult. Her R mitt was removed for participation in simple grooming tasks. Pt unable to visually identify basic self care items, I.e comb or toothbrush. Total HOH completed to initiate motor plan of brushing hair. After a minute of rest pt initiated this motor plan again herself (without holding comb) and was unable to terminate without  facilitation. After assist with termination pt laughed and said "beautiful!" Pt smiling and laughing for remainder of session. Attempted to have pt attend to/initiate any type of motor plan, including oral care, coloring, reaching, with no success. Pt intelligible but laughing and muttering words/phrases to herself. Pt was left supine with all 4 bedrails up, mitts on, and bed alarm set.   Therapy Documentation Precautions:  Precautions Precautions: Fall Precaution Comments: bilateral mitts and bedrails padded Restrictions Weight Bearing Restrictions: No   Therapy/Group: Individual Therapy  Crissie Reese 09/04/2020, 6:30 AM

## 2020-09-04 NOTE — Progress Notes (Signed)
Received a call from [place] RN reporting Ms. Roehr is hallucinating " stating someone is chasing her" she also reports she's moving in the bed from side  to side and arms flailing. Dr Riley Kill note was reviewed. Dondra Spry reports she and the CNA tried to provide emotional support and sit with the patient today, Ms. Covin remains agitated.  We will give ativan 0.5 mg po X1 now and continue to monitor. She verbalizes understanding.

## 2020-09-04 NOTE — Progress Notes (Signed)
Physical Therapy Session Note  Patient Details  Name: Cindy [name] MRN: [id number] Date of Birth: 05-May-1961  Today's Date: 09/04/2020 PT Individual Time: 1107-1150 PT Individual Time Calculation (min): 43 min   and  Today's Date: 09/04/2020 PT Missed Time: 17 Minutes Missed Time Reason: Increased agitation  Short Term Goals: Week 1:  PT Short Term Goal 1 (Week 1): pt to demonstrated supine<>sit max A x1 PT Short Term Goal 2 (Week 1): pt to demonstrate sitting balance 5 min at mod A x1 PT Short Term Goal 3 (Week 1): pt to demonstrate bed<>chair transfers max A x1 PT Short Term Goal 4 (Week 1): pt to tolerate sitting OOB 1 hour for improved participation with therapy PT Short Term Goal 5 (Week 1): Initiate gait training  Skilled Therapeutic Interventions/Progress Updates:    Pt received supine in bed, asleep and despite verbal and tactile stimulus, pt difficult to arouse keeping eyes closed. Pt noted to keep eyes closed majority of session, not opening eyelids fully despite cuing or attempting to engage pt in activities. In supine, donned pants total assist for threading B LEs and total assist for rolling while +2 pulled pants over hips. Supine>sitting L EOB +2 total assist for B LE management and trunk upright. Sitting EOB pt demos significant trunk and neck flexion with pt folded over in her lap - therapist repositioned self behind patient to promote upright trunk and head posturing with max/total facilitation while +2 assist in front of pt for safety and to provide gentle verbal stimulus to increase pt arousal. Sitting EOB with total assist for trunk and head upright positioning while attempting to engage pt in 1-step command reaching task to grasp at familiar items (pt's comb and toboggan) - removed mittens for this task and pt immediately had increased fidgeting/restlessness/perseverative movements by rubbing her hands together repeatedly without stopping and when attempting to provide  facilitation and gentle redirection to stop this movement pt began to have some increased agitation - during reaching task pt was able to successfully grasp 1 item though unable to follow 2nd command to place it in designated place. Throughout sitting task pt demos L gaze preference, likely due to cervical tightness in that direction along with significant cervical flexion with pt unable to activate cervical extensors to hold head upright without manual facilitation. Attempted sit>stand 2x from EOB with +2 assist; however, unsuccessful at lifting hips from bed on 1st attempt and during 2nd attempt able to lift hips but lacked ability to perform trunk extension to safely come upright therefore discontinued. Pt's husband arriving during this and pt starting to have increased agitation at this time. Assisted pt sit>supine with +2 total assist; however, due to increased agitation/restlessness pt starts initiating OOB mobility to L EOB stating "I'm fine" but otherwise when pt attempting to communicate with therapist it was unintelligible (pt's husband also unable to decipher what pt was saying). During this, pt able to perform supine>sitting L EOB, HOB partially elevated with max assist of 1 as pt using UEs to assist with bringing trunk upright. Attempted to calm pt while sitting EOB but she continued to be restless/agitated moving arms around with no intentional movements noted except possibly trying to gesture therapist away using L UE. Assisted pt to return to supine with +2 total assist and replaced mittens on bilateral hands - despite attempts at calming patient she remained restless/agitated with increased vocalization though unintelligible - appeared to start to calm as therapist exiting room. RN notified of pt's position  and present to address any additional needs. Pt left supine in bed with needs in reach and bed alarm on.  Therapy Documentation Precautions:  Precautions Precautions: Fall Precaution  Comments: bilateral mitts and bedrails padded Restrictions Weight Bearing Restrictions: No  Pain: Does not appear in pain.    Therapy/Group: Individual Therapy  Ginny Forth , PT, DPT, CSRS  09/04/2020, 9:38 AM

## 2020-09-05 LAB — URINE CULTURE: Culture: NO GROWTH

## 2020-09-05 NOTE — Progress Notes (Signed)
Inpatient Rehabilitation Care Coordinator Assessment and Plan Patient Details  Name: Cindy [name] MRN: [id number] Date of Birth: September 11, 1960  Today's Date: 09/05/2020  Hospital Problems: Principal Problem:   Acute metabolic encephalopathy  Past Medical History:  Past Medical History:  Diagnosis Date  . Abnormal LFTs    Hx of  . CVA (cerebral vascular accident) ([place]) 2005  . Hyperlipidemia   . Hypertension   . Polysubstance dependence including opioid type drug, episodic abuse ([place])    cocaine/etoh    Past Surgical History:  Past Surgical History:  Procedure Laterality Date  . ESOPHAGOGASTRODUODENOSCOPY N/A 08/16/2019   Procedure: ESOPHAGOGASTRODUODENOSCOPY (EGD);  Surgeon: Clarene Essex, MD;  Location: [place];  Service: Endoscopy;  Laterality: N/A;   Social History:  reports that she has never smoked. She has never used smokeless tobacco. She reports current alcohol use. She reports current drug use. Drug: Cocaine.  Family / Support Systems Marital Status: Married How Long?: 30 years Patient Roles: Spouse Spouse/Significant Other: Biochemist, clinical (husband) Children: No children Other Supports: None reported Anticipated Caregiver: Husband Ability/Limitations of Caregiver: Husband does not drive Caregiver Availability: 24/7 Family Dynamics: Pt lives with her husband  Social History Preferred language: English Religion:  Cultural Background: Pt husband reports pt stays at home all the time Education: high school Read: Yes Write: Yes Employment Status: Unemployed Date Retired/Disabled/Unemployed: Pt husband reported 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 Denies substance abuse. SW inquire about pt current cocaine use. He stated he was not aware she used. He states she could have possibly received from sister/cousin that visit.  Patient / Family Perceptions, Expectations & Goals Pt/Family understanding of illness & functional limitations: Husband has a general understanding of care needs. Premorbid pt/family roles/activities: Assistance with ADLs/IADLs Anticipated changes in roles/activities/participation: no changes  Recruitment consultant: None Premorbid Home Care/DME Agencies: None Transportation available at discharge: Husband's son or daughter Resource referrals recommended: Neuropsychology  Discharge Planning Living Arrangements: Spouse/significant other Support Systems: Spouse/significant other Type of Residence: Private residence Insurance Resources: [employer] (specify county) [facility]) Museum/gallery curator Resources: Family Support Financial Screen Referred: No Living Expenses: Medical laboratory scientific officer Management: Spouse Does the patient have any problems obtaining your medications?: No Home Management: Husband managed all homecare needs Patient/Family Preliminary Plans: No changes Care Coordinator Barriers to Discharge: Decreased caregiver support,Lack of/limited family support,Insurance for SNF coverage Care Coordinator Anticipated Follow Up Needs: HH/OP Expected length of stay: 3 weeks  Clinical Impression SW met with pt husband to complete assessment. Pt is no a veteran. No HCPOA. DME: RW, 3in1 BSC. She mainly used RW.   Delara Shepheard A Mitzie Marlar 09/05/2020, 2:13 PM

## 2020-09-05 NOTE — Progress Notes (Signed)
Occupational Therapy Session Note  Patient Details  Name: Cindy [name] MRN: [id number] Date of Birth: 1961/05/19  Today's Date: 09/05/2020 OT Individual Time: 1130-1200 OT Individual Time Calculation (min): 30 min    Short Term Goals: Week 1:  OT Short Term Goal 1 (Week 1): Pt will demonstrate improved attention to grooming task for >1 min with mod A. OT Short Term Goal 2 (Week 1): Pt will tolerate sitting EOB with mod A for >2 min. OT Short Term Goal 3 (Week 1): Pt will don shirt max A. OT Short Term Goal 4 (Week 1): Pt will self-feed 25% of meal with mod A.  Skilled Therapeutic Interventions/Progress Updates:    Pt received supine in bed, eyes open and mumbling. Pt attempting to communicate with OT but she was 100% unintelligible. Max +2 assist to transfer pt to EOB. She had better EOB sitting balance this session, requiring max A but with better upright posture. Pt completed sit > stand with 3 musketeers method and max+2 A. Stand pivot transfer to the w/c with max +2 A. Heavy cueing required for initiation of movement, facilitation of automatic motor plans, and manual facilitation of weight shift. Pt completed toilet transfer to the [facility] over toilet with heavy R lean. She was unable to void and after several minutes. Pt completed transfer back to w/c. Pt then completed hand washing at the sink with total A+2 to attempt and initiate automatic familiar motor plan. Backward chaining attempted and pt unable. Pt completed 5 ft of functional mobility to the bed with max-total A+2. Pt was able to initiate LLE stepping and required total A for RLE. Pt was transferred to supine and left with all needs met, bed alarm set.   Therapy Documentation Precautions:  Precautions Precautions: Fall Precaution Comments: bilateral mitts and bedrails padded Restrictions Weight Bearing Restrictions: No   Therapy/Group: Individual Therapy  Curtis Sites 09/05/2020, 6:36 AM

## 2020-09-05 NOTE — Progress Notes (Signed)
Patient ID: Cindy [name], female   DOB: 12-Oct-1960, 60 y.o.   MRN: [id number]  SW made efforts to contact pt husband to provide updates from [facility] with [facility]. Voicemail not set up. SW will continue to make efforts to contact.   [name], MSW, LCSWA Office: [phone] Cell: [phone] Fax: [phone]

## 2020-09-05 NOTE — Progress Notes (Addendum)
PROGRESS NOTE   Subjective/Complaints: Pt agitated last night. Was up moving this morning. Now asleep. Ativan given last night for agitation  ROS: Limited due to cognitive/behavioral   Objective:   No results found. Recent Labs    09/03/20 0631 09/04/20 1233  WBC 7.9 7.8  HGB 12.2 12.6  HCT 35.6* 38.4  PLT 436* 507*   Recent Labs    09/03/20 0631 09/04/20 1233  NA 135 136  K 3.6 3.9  CL 103 103  CO2 23 24  GLUCOSE 120* 110*  BUN 21 20  CREATININE 1.42* 1.29*  CALCIUM 9.7 9.7    Intake/Output Summary (Last 24 hours) at 09/05/2020 0848 Last data filed at 09/04/2020 1754 Gross per 24 hour  Intake 240 ml  Output --  Net 240 ml        Physical Exam: Vital Signs Blood pressure 136/73, pulse 72, temperature 98 F (36.7 C), resp. rate 16, height 5\' 7"  (1.702 m), weight 55.1 kg, SpO2 97 %.  Constitutional: No distress . Vital signs reviewed. HEENT: EOMI, oral membranes moist Neck: supple Cardiovascular: RRR without murmur. No JVD    Respiratory/Chest: CTA Bilaterally without wheezes or rales. Normal effort    GI/Abdomen: BS +, non-tender, non-distended Ext: no clubbing, cyanosis, or edema Psych: lethargic Neurological:     lethargic, moves all 4's but does not follow commands this morning. Wearing mittens  Assessment/Plan: 1. Functional deficits which require 3+ hours per day of interdisciplinary therapy in a comprehensive inpatient rehab setting.  Physiatrist is providing close team supervision and 24 hour management of active medical problems listed below.  Physiatrist and rehab team continue to assess barriers to discharge/monitor patient progress toward functional and medical goals  Care Tool:  Bathing  Bathing activity did not occur: Safety/medical concerns     Body parts bathed by helper: Right arm,Left arm,Chest,Abdomen,Front perineal area,Buttocks,Right upper leg,Left upper leg,Left lower  leg,Right lower leg,Face     Bathing assist Assist Level: Total Assistance - Patient < 25%     Upper Body Dressing/Undressing Upper body dressing Upper body dressing/undressing activity did not occur (including orthotics): Safety/medical concerns What is the patient wearing?: Hospital gown only    Upper body assist Assist Level: Total Assistance - Patient < 25%    Lower Body Dressing/Undressing Lower body dressing      What is the patient wearing?: Incontinence brief     Lower body assist Assist for lower body dressing: Dependent - Patient 0%     Toileting Toileting Toileting Activity did not occur (Clothing management and hygiene only): N/A (no void or bm)  Toileting assist Assist for toileting: Dependent - Patient 0% (incontinent)     Transfers Chair/bed transfer  Transfers assist  Chair/bed transfer activity did not occur: Safety/medical concerns        Locomotion Ambulation   Ambulation assist   Ambulation activity did not occur: Safety/medical concerns          Walk 10 feet activity   Assist  Walk 10 feet activity did not occur: Safety/medical concerns        Walk 50 feet activity   Assist Walk 50 feet with 2 turns activity did not  occur: Safety/medical concerns         Walk 150 feet activity   Assist Walk 150 feet activity did not occur: Safety/medical concerns         Walk 10 feet on uneven surface  activity   Assist Walk 10 feet on uneven surfaces activity did not occur: Safety/medical concerns         Wheelchair     Assist Will patient use wheelchair at discharge?: Yes (pt may benefit from [facility] assessment however unsafe to attempt this AM; per PT long term goals)   Wheelchair activity did not occur: Safety/medical concerns         Wheelchair 50 feet with 2 turns activity    Assist    Wheelchair 50 feet with 2 turns activity did not occur: Safety/medical concerns       Wheelchair 150 feet activity      Assist  Wheelchair 150 feet activity did not occur: Safety/medical concerns       Blood pressure 136/73, pulse 72, temperature 98 F (36.7 C), resp. rate 16, height 5\' 7"  (1.702 m), weight 55.1 kg, SpO2 97 %.  Medical Problem List and Plan: 1.  Right side weakness with decreased functional mobility secondary to delirium and acute metabolic encephalopathy due to status epilepticus             -patient may shower             -ELOS/Goals: mod A 2-3 weeks+             -Continue CIR PT, OT, SLP  - Advanced WM changes on MRI 4/12 likely related to SVD. Pt + for cocaine on admit. Suspect limited goals at this point. Need to engage family regarding their ability to help 2.  Impaired mobility -DVT/anticoagulation: Continue Lovenox             -antiplatelet therapy: Aspirin 325 mg 3. Pain Management: Tylenol as needed 4. Agitation/severely distracted: Antipsychotic: Seroquel 50 mg TID ordered as agitation impacted ability to participate in therapy.  -continue propranolol  - Continue bilateral mittens as needed  -4/20   -reduced seroquel, off inderal   -urine, labs really unremarkable yesterday other than BUN sl up   -limit neurosedating meds as possible 5. Neuropsych: This patient is capable of making decisions on her own behalf. 6. Skin/Wound Care: Routine skin checks 7. Fluids/Electrolytes/Nutrition:    -I personally reviewed the patient's labs today.    4/20 continue IVF supplementation until she's more alert   -protein supps for low albumin 8.  Seizure disorder.  Vimpat 200 mg twice daily, valproic acid 1000 mg twice daily.  EEG negative 9.  Hypertension.  Norvasc 10 mg daily     4/20 bp controlled, now on lopressor 25mg  bid in place of toprol 10.  History of bilateral cerebral convexity infarction frontal region.  CIR 08/19/2019-08/27/2019 11.  Hyperlipidemia.  Continue Lipitor 12.  History of polysubstance abuse.  Urine drug screen positive cocaine.  Counseling 13. AKI:  creatinine normal on 4/14. Some elevation d/t prerenal  14. UTI: UCX never performed. Continue keflex for now   -recollect urine sample  15. Dysphagia:  downgraded diet to D2 with thickened liquids give coughing spell this weekend  -cxr ok, afebrile   -4/19 diet downgraded to D1, thins  -advance as clinically appropriate LOS: 5 days A FACE TO FACE EVALUATION WAS PERFORMED  5/14 09/05/2020, 8:48 AM

## 2020-09-05 NOTE — Progress Notes (Signed)
Occupational Therapy Session Note  Patient Details  Name: Cindy [name] MRN: [id number] Date of Birth: 19-Feb-1961  Today's Date: 09/05/2020 OT Individual Time: 1445-1500 and 1400-1420 OT Individual Time Calculation (min): 15 min + 20 min   Short Term Goals: Week 1:  OT Short Term Goal 1 (Week 1): Pt will demonstrate improved attention to grooming task for >1 min with mod A. OT Short Term Goal 2 (Week 1): Pt will tolerate sitting EOB with mod A for >2 min. OT Short Term Goal 3 (Week 1): Pt will don shirt max A. OT Short Term Goal 4 (Week 1): Pt will self-feed 25% of meal with mod A.  Skilled Therapeutic Interventions/Progress Updates:    Before OT enters room OT can here pt yelling mostly unintelligibly but also able to make out, "fight me." OT asks pt who she was talking to but pt unable to understand. Pt restless and pulling hands away from OT when attempting to remove mitts. Total A sup>sit with +2 completed with total A for sitting balance and pt swatting/hitting OT lightly at hip. Pt returned to supine and very restless. Pt able to motor plan 2 scoots to [facility], but turns perpendicular in bed requiring +2 A to reposition. Pt let rest for 25 min as OT found pill in bed and alerted RN who then provided "seroquil." Upon return pt more settled but still muttering to herself, "Itll be my fault" throughout as well as "uncle" and "christmas." Pt initiates bringing BLE to EOB and sits EOB with MOD-MAX A initially for sitting balance and eventually mi-CGA with repositioning of bed for pt to attend to window on L side of visual field. Pt unable to intiaite giving RT high 5 without HOH A. Returned to supine total A +2. Exited session with pt seated in bed, exit alarm on and call light in reach.   Therapy Documentation Precautions:  Precautions Precautions: Fall Precaution Comments: bilateral mitts and bedrails padded Restrictions Weight Bearing Restrictions: No General:   Vital Signs: Therapy  Vitals Temp: 98 F (36.7 C) Pulse Rate: 72 Resp: 16 BP: 136/73 Patient Position (if appropriate): Lying Oxygen Therapy SpO2: 97 % O2 Device: Room Air Pain:   ADL: ADL Eating: Unable to assess Grooming: Unable to assess Upper Body Bathing: Unable to assess Lower Body Bathing: Unable to assess Upper Body Dressing: Unable to assess Lower Body Dressing: Dependent Where Assessed-Lower Body Dressing: Bed level Toileting: Unable to assess Toilet Transfer: Unable to assess Tub/Shower Transfer: Unable to assess Psychologist, counselling Transfer: Unable to assess Vision   Perception    Praxis   Exercises:   Other Treatments:     Therapy/Group: Individual Therapy  Shon Hale 09/05/2020, 6:57 AM

## 2020-09-05 NOTE — Progress Notes (Addendum)
Physical Therapy Session Note  Patient Details  Name: Cindy [name] MRN: [id number] Date of Birth: 1961/01/13  Today's Date: 09/05/2020 PT Individual Time: 0900-0945 PT Individual Time Calculation (min): 45 min   Short Term Goals: Week 1:  PT Short Term Goal 1 (Week 1): pt to demonstrated supine<>sit max A x1 PT Short Term Goal 2 (Week 1): pt to demonstrate sitting balance 5 min at mod A x1 PT Short Term Goal 3 (Week 1): pt to demonstrate bed<>chair transfers max A x1 PT Short Term Goal 4 (Week 1): pt to tolerate sitting OOB 1 hour for improved participation with therapy PT Short Term Goal 5 (Week 1): Initiate gait training  Skilled Therapeutic Interventions/Progress Updates:     Patient in bed asleep upon PT arrival. Patient opened her eyes x1 to tactile stimulation, otherwise she did not arouse to verbal, tactile, or painful stimulation, nor vertical sitting >5 min. RN aware and in the room with patient in sitting. Patient moved toward the pain stimulus with muscle roll to upper traps x2 after returning to lying. Vitals: BP 144/79, HR 72, SPO2 98%. Per chart, patient received Ativan last night, RN in agreement that she may be lethargic from this medication and reports that the patient had not received her morning medications due to lethargy.   Performed rolling in the bed, supine to/from sit, and scooting up in the bed with total A +2 in attempts to arouse patient with upright sitting to activate RAS without success. Performed passive cervical ROM in sitting for improved extension with shoulder retraction and passive upper and lower extremity range in sitting and lying.   Provided stimulating environment with lights on, blinds up, and door open throughout session to improve arousal.   Patient in bed supported by a pillow on her R side at end of session with breaks locked, bed alarm set, B floor mats in place, and all needs within reach. Patient missed 15 min of skilled PT due to lethargy, RN  made aware. Will attempt to make-up missed time as able.     Therapy Documentation Precautions:  Precautions Precautions: Fall Precaution Comments: bilateral mitts and bedrails padded Restrictions Weight Bearing Restrictions: No   Therapy/Group: Individual Therapy  [name] L Kortne All PT, DPT  09/05/2020, 4:34 PM

## 2020-09-05 NOTE — Progress Notes (Signed)
Speech Language Pathology Daily Session Note  Patient Details  Name: Cindy [name] MRN: [id number] Date of Birth: Oct 21, 1960  Today's Date: 09/05/2020 SLP Individual Time: 1448-1856 SLP Individual Time Calculation (min): 40 min  Short Term Goals: Week 1: SLP Short Term Goal 1 (Week 1): Pt will attend to basic and familiar tasks for 3-4 minutes intervals with mod A verbal cues for redirection. SLP Short Term Goal 2 (Week 1): Pt will tolerate dental soft diet and thin liquids with no overt s/sx of aspiration or penetration on 9/10 trials. SLP Short Term Goal 3 (Week 1): Pt will verbally respond to basic questions regarding wants/needs in 75% of opportunities with mod A verbal cues. SLP Short Term Goal 4 (Week 1): Pt will follow basic one step commands on 75% of opportunities with mod A verbal and visual cues. SLP Short Term Goal 5 (Week 1): Pt will orient to location and medical situation with mod A verbal cues.  Skilled Therapeutic Interventions: Skilled treatment session focused on dysphagia and cognitive goals. Upon arrival, patient was awake in bed. Patient was verbose and was only ~25% intelligible. Patient unable to focus attention to her name and could not answer any basic yes/no question appropriately. Patient also unable to follow commands and required total A to consume snack of Dys. 1 textures. Patient consumed snack without overt s/s of aspiration but required intermittent moderate verbal cues for awareness of bolus due to verbosity. Patient left upright in bed with alarm on and all needs within reach. Continue with current plan of care.      Pain No/Denies Pain   Therapy/Group: Individual Therapy  Karolynn Infantino 09/05/2020, 3:43 PM

## 2020-09-06 NOTE — Progress Notes (Signed)
Speech Language Pathology Weekly Progress and Session Note  Patient Details  Name: Cindy [name] MRN: [id number] Date of Birth: [birth year]  Beginning of progress report period: August 31, 2020 End of progress report period: September 06, 2020  Today's Date: 09/06/2020  Session 1: SLP Individual Time: 6808-8110 SLP Individual Time Calculation (min): 15 min and   Session 2: SLP Co-Treatment Time: 1445-1500 ((co-tx with PT (CG) 1430-1500)) SLP Co-Treatment Time Calculation (min): 15 min  Short Term Goals: Week 1: SLP Short Term Goal 1 (Week 1): Pt will attend to basic and familiar tasks for 3-4 minutes intervals with mod A verbal cues for redirection. SLP Short Term Goal 1 - Progress (Week 1): Not met SLP Short Term Goal 2 (Week 1): Pt will tolerate dental soft diet and thin liquids with no overt s/sx of aspiration or penetration on 9/10 trials. SLP Short Term Goal 2 - Progress (Week 1): Not met SLP Short Term Goal 3 (Week 1): Pt will verbally respond to basic questions regarding wants/needs in 75% of opportunities with mod A verbal cues. SLP Short Term Goal 3 - Progress (Week 1): Not met SLP Short Term Goal 4 (Week 1): Pt will follow basic one step commands on 75% of opportunities with mod A verbal and visual cues. SLP Short Term Goal 4 - Progress (Week 1): Not met SLP Short Term Goal 5 (Week 1): Pt will orient to location and medical situation with mod A verbal cues. SLP Short Term Goal 5 - Progress (Week 1): Not met    New Short Term Goals: Week 2: SLP Short Term Goal 1 (Week 2): Patient will focus attention to verbal or tactile stimuli in 25% of opportunities with Max A multimodal cues. SLP Short Term Goal 2 (Week 2): Patient will respond to basic yes/no questions in 25% of opportunities with Max A multimodal cues. SLP Short Term Goal 3 (Week 2): Patient will follow 1-step commands in 25% of opportunities with Max A multimodal cues. SLP Short Term Goal 4 (Week 2): Patient will be ~50%  intelligibile at the phrase level with Max A multimodal cues. SLP Short Term Goal 5 (Week 2): Patient will consume current diet of Dys. 1 textures with thin liquids with minimal overt s/s of aspiration and Max A multimodal cues for use of swallowing compensatory strategies.  Weekly Progress Updates: Patient has made very minimal progress and has not met any STGs this reporting period with progress limited by fatigue, restlessness and intermittent agitation. Currently, patient requires overall total A to complete basic and familiar tasks safely with severe impairments in attention and initiation. Patient is verbose with intermittent hallucinations and generalized confusion. Patient also has severely impaired speech intelligibility and is ~25% intelligible at the word and phrase level. Patient's diet recommendations have been downgraded to Dys. 1 textures due to significant coughing with soft solids, suspect due to poor awareness of bolus. However, patient appears to be tolerating thin liquids with minimal overt s/s of aspiration. Family has not been present during sessions to initiate education. Patient would benefit from continued skilled SLP intervention to maximize her cognitive, swallowing and speech function prior to discharge.      Intensity: Minumum of 1-2 x/day, 30 to 90 minutes Frequency: 3 to 5 out of 7 days Duration/Length of Stay: 3 weeks Treatment/Interventions: Cognitive remediation/compensation;Speech/Language facilitation;Therapeutic Activities;Therapeutic Exercise;Dysphagia/aspiration precaution training;Patient/family education;Environmental controls;Cueing hierarchy;Functional tasks   Daily Session  Skilled Therapeutic Interventions:    Session 1: Skilled treatment session focused on cognitive goals. Upon arrival,  patient was asleep while supine in bed. Patient remained lethargic despite Max verbal and tactile cues. SLP also provided oral care via the suction toothbrush with  minimal responsiveness. Therefore, patient missed remaining 30 minutes of session. Will re-attempt as able. Continue with current plan of care.   Session 2: Skilled co-treatment session with PT focused on arousal and proper positioning for PO intake. Patient was awake upon arrival and agreeable to participate in treatment session. PT transferred to tilt-in-space wheelchair to maximize ease of PO intake. Patient attempted to feed herself with hand over hand assist with a built up grip. Patient consumed Dys. 1 textures with minimal overt s/s of aspiration. Recommend patient continue current diet. Patient initially responsive to question in 10% of opportunities and was ~25% intelligible at the phrase level. As session continued, patient with minimal verbal expression and appeared lethargic. Patient transferred back to bed. Patient left in bed with alarm on and all needs within reach. Continue with current plan of care.    Pain No indications of pain  Therapy/Group: Individual Therapy  Allsion Nogales 09/06/2020, 6:37 AM

## 2020-09-06 NOTE — Progress Notes (Signed)
Physical Therapy Session Note  Patient Details  Name: Cindy [name] MRN: [id number] Date of Birth: [birth year]  Today's Date: 09/06/2020 PT Individual Time: 1500-1526 and 1550-1600 PT Individual Time Calculation (min): 26 min and 10 min  PT Co-Treatment Time: 1430-1445 (co-tx with SLP (CP) 1430-1500) PT Co-Treatment Time Calculation (min): 15 min  Short Term Goals: Week 1:  PT Short Term Goal 1 (Week 1): pt to demonstrated supine<>sit max A x1 PT Short Term Goal 2 (Week 1): pt to demonstrate sitting balance 5 min at mod A x1 PT Short Term Goal 3 (Week 1): pt to demonstrate bed<>chair transfers max A x1 PT Short Term Goal 4 (Week 1): pt to tolerate sitting OOB 1 hour for improved participation with therapy PT Short Term Goal 5 (Week 1): Initiate gait training  Skilled Therapeutic Interventions/Progress Updates:     Patient in bed upon PT and SLP arrival for co-tx focused on functional mobility and TIS w/c assessment and adjustment for improved OOB sitting tolerance and positioning during swallowing. Patient alert and participative throughout session. Patient did not indicate pain during session.  Therapeutic Activity: Bed Mobility: Patient performed supine to/from sit with max A. Provided verbal cues for rolling to side-lying, setting bottom elbow, and pushing to sit-up, and reverse to return to lying. She performed scooting up in the bed with dependent assist. Transfers: Patient performed sit to/from stand with mod-max A and stand pivot with max A and +2 SBA for safety. Provided verbal cues for forward weight shift to stand, stepping, and reciprocal scooting with total A once seated in TIS w/c. She performed squat pivot back to bed due to increased fatigue and L lean with total A +2.   Provided lateral support on R with pillow and foam wedge and adjusted head rest and leg rests for improved sitting posture in TIS.  SLP exited session following feeding trials. Continued session with padding  foot plates on leg rests for pressure relief and skin integrity. Repositioned patient in the bed x2 with total A due to R lean. Placed a pillow and wedge under R trunk to reduce R lean in semi-reclined position. Elevated R upper extremity on a pillow as well.   Patient in bed with Posey waist belt and B mitts in place at end of session with breaks locked, bed and belt alarm set, and all needs within reach.   1550-1600: Returned to patient's room due to [facility] alarming and 4 NTs running into the room and speaking loudly to the patient escalating the patient's agitation. PT calmly asked NTs to step out of the room upon arrival, as patient was in the bed with her feet over the edge with posey belt partially doffed and B mitts doffed. Provided calming tone and comforted and re-oriented patient. Repositioned patient in the bed with total A, as above. Patient initially restless and speaking incoherently, but calmed by blanket being placed over her. Informed patient of what I was going to do, then applied B mitts and posey belt without resistance from patient. Closed the blinds and turned on meditation music on the computer for improved relaxation. Patient lying calmly in the bed with her eyes closed, Posey belt in place, B mitts donned, breaks locked, Posey belt and bed alarmed, and all needs in reach.      Therapy Documentation Precautions:  Precautions Precautions: Fall Precaution Comments: bilateral mitts and bedrails padded Restrictions Weight Bearing Restrictions: No   Therapy/Group: Individual Therapy  Noal Abshier L Angelynn Lemus PT, DPT  09/06/2020, 6:30 PM

## 2020-09-06 NOTE — Progress Notes (Signed)
PROGRESS NOTE   Subjective/Complaints: Sitting up in bed, alert. Confused. Eating breakfast fed by NT.   ROS: Limited due to cognitive/behavioral    Objective:   No results found. Recent Labs    09/04/20 1233  WBC 7.8  HGB 12.6  HCT 38.4  PLT 507*   Recent Labs    09/04/20 1233  NA 136  K 3.9  CL 103  CO2 24  GLUCOSE 110*  BUN 20  CREATININE 1.29*  CALCIUM 9.7    Intake/Output Summary (Last 24 hours) at 09/06/2020 1331 Last data filed at 09/06/2020 0844 Gross per 24 hour  Intake 118 ml  Output --  Net 118 ml        Physical Exam: Vital Signs Blood pressure (!) 180/88, pulse 88, temperature 98.6 F (37 C), temperature source Axillary, resp. rate 19, height 5\' 7"  (1.702 m), weight 55 kg, SpO2 100 %.  Constitutional: No distress . Vital signs reviewed. HEENT: EOMI, oral membranes moist Neck: supple Cardiovascular: RRR without murmur. No JVD    Respiratory/Chest: CTA Bilaterally without wheezes or rales. Normal effort    GI/Abdomen: BS +, non-tender, non-distended Ext: no clubbing, cyanosis, or edema Psych: restless, agitated   Neuro:  moves all 4's, feeds with cueing.  Confused, aphasic  Assessment/Plan: 1. Functional deficits which require 3+ hours per day of interdisciplinary therapy in a comprehensive inpatient rehab setting.  Physiatrist is providing close team supervision and 24 hour management of active medical problems listed below.  Physiatrist and rehab team continue to assess barriers to discharge/monitor patient progress toward functional and medical goals  Care Tool:  Bathing  Bathing activity did not occur: Safety/medical concerns     Body parts bathed by helper: Right arm,Left arm,Chest,Abdomen,Front perineal area,Buttocks,Right upper leg,Left upper leg,Left lower leg,Right lower leg,Face     Bathing assist Assist Level: Total Assistance - Patient < 25%     Upper Body  Dressing/Undressing Upper body dressing Upper body dressing/undressing activity did not occur (including orthotics): Safety/medical concerns What is the patient wearing?: Hospital gown only    Upper body assist Assist Level: Total Assistance - Patient < 25%    Lower Body Dressing/Undressing Lower body dressing      What is the patient wearing?: Incontinence brief     Lower body assist Assist for lower body dressing: Dependent - Patient 0%     Toileting Toileting Toileting Activity did not occur (Clothing management and hygiene only): N/A (no void or bm)  Toileting assist Assist for toileting: Dependent - Patient 0% (incontinent)     Transfers Chair/bed transfer  Transfers assist  Chair/bed transfer activity did not occur: Safety/medical concerns        Locomotion Ambulation   Ambulation assist   Ambulation activity did not occur: Safety/medical concerns          Walk 10 feet activity   Assist  Walk 10 feet activity did not occur: Safety/medical concerns        Walk 50 feet activity   Assist Walk 50 feet with 2 turns activity did not occur: Safety/medical concerns         Walk 150 feet activity   Assist Walk 150  feet activity did not occur: Safety/medical concerns         Walk 10 feet on uneven surface  activity   Assist Walk 10 feet on uneven surfaces activity did not occur: Safety/medical concerns         Wheelchair     Assist Will patient use wheelchair at discharge?: Yes (pt may benefit from [facility] assessment however unsafe to attempt this AM; per PT long term goals)   Wheelchair activity did not occur: Safety/medical concerns         Wheelchair 50 feet with 2 turns activity    Assist    Wheelchair 50 feet with 2 turns activity did not occur: Safety/medical concerns       Wheelchair 150 feet activity     Assist  Wheelchair 150 feet activity did not occur: Safety/medical concerns       Blood pressure (!)  180/88, pulse 88, temperature 98.6 F (37 C), temperature source Axillary, resp. rate 19, height 5\' 7"  (1.702 m), weight 55 kg, SpO2 100 %.  Medical Problem List and Plan: 1.  Right side weakness with decreased functional mobility secondary to delirium and acute metabolic encephalopathy due to status epilepticus             -patient may shower             -ELOS/Goals: mod A 2-3 weeks+             -Continue CIR PT, OT, SLP  - Advanced WM changes on MRI 4/12 likely related to SVD. Pt + for cocaine on admit.    -slow progess at this point d/t significant cognitive-linguistic deficis 2.  Impaired mobility -DVT/anticoagulation: Continue Lovenox             -antiplatelet therapy: Aspirin 325 mg 3. Pain Management: Tylenol as needed 4. Agitation/severely distracted: Antipsychotic: Seroquel 50 mg TID ordered as agitation impacted ability to participate in therapy.  -continue propranolol  - Continue bilateral mittens as needed  -4/20   -reduced seroquel, off inderal   -urine, labs really unremarkable other than BUN sl up   -limit neurosedating meds as possible 5. Neuropsych: This patient is capable of making decisions on her own behalf. 6. Skin/Wound Care: Routine skin checks 7. Fluids/Electrolytes/Nutrition:    -I personally reviewed the patient's labs today.    4/21 eating more. Will hold IVF   -protein supps for low albumin 8.  Seizure disorder.  Vimpat 200 mg twice daily, valproic acid 1000 mg twice daily.  EEG negative 9.  Hypertension.  Norvasc 10 mg daily     4/20 bp borderline to elevated today, now on lopressor 25mg  bid in place of toprol---no changes today 10.  History of bilateral cerebral convexity infarction frontal region.  CIR 08/19/2019-08/27/2019 11.  Hyperlipidemia.  Continue Lipitor 12.  History of polysubstance abuse.  Urine drug screen positive cocaine.  Counseling 13. AKI: creatinine normal on 4/14. Some elevation d/t prerenal  14. UTI: UCX never performed.     -f/u urine  cx negative 15. Dysphagia:  downgraded diet to D2 with thickened liquids give coughing spell this weekend  -cxr ok, afebrile   -4/19 diet downgraded to D1, thins  -advance as clinically appropriate LOS: 6 days A FACE TO FACE EVALUATION WAS PERFORMED  5/14 09/06/2020, 1:31 PM

## 2020-09-06 NOTE — Progress Notes (Signed)
Patient awaken and yelling and screaming out incomprehensible words with actively swinging her arms and legs,She is uncontrollable and has visual and auditory hallucinations  incontinent care provided with the assistance of 2 staff member at bedside Continue to monitor and assist,

## 2020-09-06 NOTE — Progress Notes (Signed)
Occupational Therapy Session Note  Patient Details  Name: Cindy [name] MRN: [id number] Date of Birth: [birth year]  Today's Date: 09/06/2020 OT Individual Time: 0915-1000 OT Individual Time Calculation (min): 45 min  and Today's Date: 09/06/2020 OT Missed Time: 15 Minutes Missed Time Reason: Other (comment) (agitation/unarousable)   Short Term Goals: Week 1:  OT Short Term Goal 1 (Week 1): Pt will demonstrate improved attention to grooming task for >1 min with mod A. OT Short Term Goal 2 (Week 1): Pt will tolerate sitting EOB with mod A for >2 min. OT Short Term Goal 3 (Week 1): Pt will don shirt max A. OT Short Term Goal 4 (Week 1): Pt will self-feed 25% of meal with mod A.  Skilled Therapeutic Interventions/Progress Updates:    1:1. Pt received in bed agreeable verbalizing to self and beginning to initiate sitting up on EOB. Pt requires total A of 1 to assume sitting at EOB and mod-max A for sitting balance at EOB with +2 threading BLE into pants. Pt cued to sit to stand but becoming to agitated stating, "no" and "off." Pt lays trunk back into bed and OT assists BLE into bed. Pt screaming unidentifiable things, but eventually begins crying/yelling, "somethings wrong with me" and "I dont sleep at night." this goes on for ~5 min and pt does not respond well to therapeutic touch/soothing voice, therefore OT plays Calming music providing audible slow inhales/exhales to cadence of music and pt calms in about 10 min. When going to readjust pt in bed sound asleep and does not respond to any stimuli, but opening eyes~2 sec. Pt missed 15 min skilled OT d/t unarousable. RN and NT alerted to playlist still playing and pt positioning with exit alarm set in bed/aiste restraint in place.   Therapy Documentation Precautions:  Precautions Precautions: Fall Precaution Comments: bilateral mitts and bedrails padded Restrictions Weight Bearing Restrictions: No General:   Vital Signs: Therapy Vitals Temp:  98.6 F (37 C) Temp Source: Axillary Pulse Rate: 88 Resp: 19 BP: (!) 180/88 Patient Position (if appropriate): Lying Oxygen Therapy SpO2: 100 % O2 Device: Room Air Pain:    ADL: ADL Eating: Unable to assess Grooming: Unable to assess Upper Body Bathing: Unable to assess Lower Body Bathing: Unable to assess Upper Body Dressing: Unable to assess Lower Body Dressing: Dependent Where Assessed-Lower Body Dressing: Bed level Toileting: Unable to assess Toilet Transfer: Unable to assess Tub/Shower Transfer: Unable to assess Psychologist, counselling Transfer: Unable to assess Vision   Perception    Praxis   Exercises:   Other Treatments:     Therapy/Group: Individual Therapy  Shon Hale 09/06/2020, 6:49 AM

## 2020-09-06 NOTE — Progress Notes (Signed)
Patient ID: Cindy [name], female   DOB: October 27, 1960, 61 y.o.   MRN: [id number]   SW made efforts to contact pt husband Cindy [name] [phone]) to provide updates from speaking with [facility]. Voicemail not set up. SW will continue to make efforts to contact.   [name], MSW, LCSWA Office: [phone] Cell: [phone] Fax: [phone]

## 2020-09-07 LAB — BASIC METABOLIC PANEL
Anion gap: 8 (ref 5–15)
BUN: 21 mg/dL (ref 8–23)
CO2: 26 mmol/L (ref 22–32)
Calcium: 9.3 mg/dL (ref 8.9–10.3)
Chloride: 104 mmol/L (ref 98–111)
Creatinine, Ser: 1.24 mg/dL — ABNORMAL HIGH (ref 0.44–1.00)
GFR, Estimated: 50 mL/min — ABNORMAL LOW (ref >60)
Glucose, Bld: 103 mg/dL — ABNORMAL HIGH (ref 70–99)
Potassium: 3 mmol/L — ABNORMAL LOW (ref 3.5–5.1)
Sodium: 138 mmol/L (ref 135–145)

## 2020-09-07 MED ORDER — METHYLPHENIDATE HCL 5 MG PO TABS
5.0000 mg | ORAL_TABLET | Freq: Two times a day (BID) | ORAL | Status: DC
  Administered 2020-09-08: 5 mg via ORAL
  Filled 2020-09-07: qty 1

## 2020-09-07 MED ORDER — METHYLPHENIDATE HCL 5 MG PO TABS
10.0000 mg | ORAL_TABLET | Freq: Two times a day (BID) | ORAL | Status: DC
  Administered 2020-09-07: 10 mg via ORAL
  Filled 2020-09-07: qty 2

## 2020-09-07 MED ORDER — HALOPERIDOL LACTATE 5 MG/ML IJ SOLN
1.0000 mg | Freq: Two times a day (BID) | INTRAMUSCULAR | Status: DC | PRN
  Administered 2020-09-07 – 2020-09-18 (×4): 1 mg via INTRAMUSCULAR
  Filled 2020-09-07 (×5): qty 0.2

## 2020-09-07 MED ORDER — QUETIAPINE FUMARATE 25 MG PO TABS
25.0000 mg | ORAL_TABLET | Freq: Three times a day (TID) | ORAL | Status: DC
  Administered 2020-09-07 – 2020-09-11 (×11): 25 mg via ORAL
  Filled 2020-09-07 (×11): qty 1

## 2020-09-07 MED ORDER — QUETIAPINE FUMARATE 25 MG PO TABS: 25.0000 mg | ORAL_TABLET | Freq: Two times a day (BID) | ORAL | Status: DC

## 2020-09-07 MED ORDER — HALOPERIDOL 1 MG PO TABS
1.0000 mg | ORAL_TABLET | Freq: Two times a day (BID) | ORAL | Status: DC | PRN
  Administered 2020-09-20 – 2020-09-24 (×3): 1 mg via ORAL
  Filled 2020-09-07 (×5): qty 1

## 2020-09-07 NOTE — Progress Notes (Signed)
Patient ID: Cindy [name], female   DOB: [birth year], 60 y.o.   MRN: [id number]  SW left message for [facility] [phone] cell/[phone] office) to discuss patient care needs, and in which capacity she assists. Per husband reports, she visits once twice a week for medications.   [name], MSW, LCSWA Office: [phone] Cell: [phone] Fax: [phone]

## 2020-09-07 NOTE — Progress Notes (Addendum)
Physical Therapy Session Note  Patient Details  Name: Cindy [name] MRN: [id number] Date of Birth: 12-11-1960  Today's Date: 09/07/2020 PT Individual Time: 2979-8921 and 1300-1315 PT Individual Time Calculation (min): 32 min and 15 min.  Short Term Goals: Week 1:  PT Short Term Goal 1 (Week 1): pt to demonstrated supine<>sit max A x1 PT Short Term Goal 2 (Week 1): pt to demonstrate sitting balance 5 min at mod A x1 PT Short Term Goal 3 (Week 1): pt to demonstrate bed<>chair transfers max A x1 PT Short Term Goal 4 (Week 1): pt to tolerate sitting OOB 1 hour for improved participation with therapy PT Short Term Goal 5 (Week 1): Initiate gait training  Skilled Therapeutic Interventions/Progress Updates:   First session:  Pt presents supine in bed, just finishing w/ patient care from NT and RN.  Pt somewhat agitated and allowed to calm as prepares bed and pads, hip restraints and bed alarm for mobility.  Pt ignoring Left side, holding L hand alongside head.  Pt required max A and increased time w/ calm voice to transition to sitting EOB.  Pt requires mod A to maintain w/ retropulsion noted .  Pt resists Left am placement away from ear/head.  Pt sat EOB x 5' but increased agitation and assisted back to bed.  Pt speaking but unintelligible and continuing to push up onto R elbow, yelling at window.  Hip belt placed w/ alarm on and all pads on siderails, bed alarm on after scooting to [facility] w/ NT assist.    Second session: PT entered room and NT attempting to feed pt.  Pt rolling head side to side and yelling "NOo No No!", crying.  Pt handed off to PT for attempted mobility.  PT still refusing to eat.  PT decided to stop all rx and allow pt to calm w/ use of music in room.  Pt eventually stopped yelling and calmed, but falling asleep.  PT attempted to initiate arousal rubbing face to attempt feeding but again increased agitation.  No further attempts.  Bed alarm and [facility] on and all siderails up.   Mitts remain on BUEs.         Therapy Documentation Precautions:  Precautions Precautions: Fall Precaution Comments: bilateral mitts and bedrails padded Restrictions Weight Bearing Restrictions: No General: PT Amount of Missed Time (min): 13 Minutes PT Missed Treatment Reason: Increased agitation Vital Signs:   Pain: does not appear to have pain. Pain Assessment Pain Scale: 0-10 Pain Score: 0-No pain Mobility:      Therapy/Group: Individual Therapy  Lucio Edward 09/07/2020, 10:35 AM

## 2020-09-07 NOTE — Plan of Care (Signed)
  Problem: Consults Goal: RH STROKE PATIENT EDUCATION Description: See Patient Education module for education specifics  Outcome: Progressing   Problem: RH BOWEL ELIMINATION Goal: RH STG MANAGE BOWEL WITH ASSISTANCE Description: STG Manage Bowel with min Assistance. Outcome: Progressing Goal: RH STG MANAGE BOWEL W/MEDICATION W/ASSISTANCE Description: STG Manage Bowel with Medication with min Assistance. Outcome: Progressing   Problem: RH BLADDER ELIMINATION Goal: RH STG MANAGE BLADDER WITH ASSISTANCE Description: STG Manage Bladder With min Assistance Outcome: Progressing   Problem: RH SKIN INTEGRITY Goal: RH STG MAINTAIN SKIN INTEGRITY WITH ASSISTANCE Description: STG Maintain Skin Integrity With min Assistance. Outcome: Progressing Goal: RH STG ABLE TO PERFORM INCISION/WOUND CARE W/ASSISTANCE Description: STG Able To Perform Incision/Wound Care With min Assistance. Outcome: Progressing   Problem: RH SAFETY Goal: RH STG ADHERE TO SAFETY PRECAUTIONS W/ASSISTANCE/DEVICE Description: STG Adhere to Safety Precautions With min Assistance/Device. Outcome: Progressing Goal: RH STG DECREASED RISK OF FALL WITH ASSISTANCE Description: STG Decreased Risk of Fall With min Assistance. Outcome: Progressing   Problem: RH PAIN MANAGEMENT Goal: RH STG PAIN MANAGED AT OR BELOW PT'S PAIN GOAL Description: <3 on a 0-10 pain scale. Outcome: Progressing   

## 2020-09-07 NOTE — Progress Notes (Signed)
Speech Language Pathology Daily Session Note  Patient Details  Name: Cindy [name] MRN: [id number] Date of Birth: 08-Aug-1960  Today's Date: 09/07/2020 SLP Individual Time: 1400-1445 SLP Individual Time Calculation (min): 45 min and Today's Date: 09/07/2020 SLP Missed Time: 15 Minutes Missed Time Reason: Increased agitation  Short Term Goals: Week 2: SLP Short Term Goal 1 (Week 2): Patient will focus attention to verbal or tactile stimuli in 25% of opportunities with Max A multimodal cues. SLP Short Term Goal 2 (Week 2): Patient will respond to basic yes/no questions in 25% of opportunities with Max A multimodal cues. SLP Short Term Goal 3 (Week 2): Patient will follow 1-step commands in 25% of opportunities with Max A multimodal cues. SLP Short Term Goal 4 (Week 2): Patient will be ~50% intelligibile at the phrase level with Max A multimodal cues. SLP Short Term Goal 5 (Week 2): Patient will consume current diet of Dys. 1 textures with thin liquids with minimal overt s/s of aspiration and Max A multimodal cues for use of swallowing compensatory strategies.  Skilled Therapeutic Interventions: Skilled treatment session focused on cognitive goals. Upon arrival, patient appeared extremely restless. SLP attempted to calm patient by addressing comfort and basic needs. Patient was incontinent of bowel X 2 and required total +2 assist for safety during peri care and while donning a new brief. Throughout session, patient was essentially unintelligible and was unable to focus her attention to her name being called and was unable to answer any yes/no questions. Patient remained agitated despite attempting to meet all needs and maximize comfort, therefore, session ended early. Patient left supine in bed with posey belt in place and all needs within reach. Continue with current plan of care.      Pain No indications of pain   Therapy/Group: Individual Therapy  Crecencio Kwiatek 09/07/2020, 2:52 PM

## 2020-09-07 NOTE — Progress Notes (Signed)
Nurse presented to room to round on patient. Patient presented hallucinating, yelling and presented with increased breathing. Patient recently had a medication adjustment earlier today. Patient was able to be calmed x 3 staff. MD called to report change. MD placed new order for Haldol IM or PO PRN. Haldol was given IM due to patient would not take anything PO. Patient continues in bed creaming from time to time and presenting at a 180 degree turn in bed. All safety measures exercised. Next shift nurse informed. Cletis Media, LPN

## 2020-09-07 NOTE — Progress Notes (Addendum)
PROGRESS NOTE   Subjective/Complaints: Pt in bed. Restless. Mumbling  ROS: Limited due to cognitive/behavioral    Objective:   No results found. Recent Labs    09/04/20 1233  WBC 7.8  HGB 12.6  HCT 38.4  PLT 507*   Recent Labs    09/04/20 1233 09/07/20 0704  NA 136 138  K 3.9 3.0*  CL 103 104  CO2 24 26  GLUCOSE 110* 103*  BUN 20 21  CREATININE 1.29* 1.24*  CALCIUM 9.7 9.3    Intake/Output Summary (Last 24 hours) at 09/07/2020 1022 Last data filed at 09/07/2020 0600 Gross per 24 hour  Intake 140 ml  Output --  Net 140 ml        Physical Exam: Vital Signs Blood pressure 112/76, pulse 78, temperature 97.8 F (36.6 C), resp. rate 18, height 5\' 7"  (1.702 m), weight 57.3 kg, SpO2 96 %.  Constitutional: No distress . Vital signs reviewed. HEENT: EOMI, oral membranes moist Neck: supple Cardiovascular: RRR without murmur. No JVD    Respiratory/Chest: CTA Bilaterally without wheezes or rales. Normal effort    GI/Abdomen: BS +, non-tender, non-distended Ext: no clubbing, cyanosis, or edema Psych: restless and distracted  Neuro:  moves all 4's, feeds with cueing.  Confused, dysarthric, ?aphasic  Assessment/Plan: 1. Functional deficits which require 3+ hours per day of interdisciplinary therapy in a comprehensive inpatient rehab setting.  Physiatrist is providing close team supervision and 24 hour management of active medical problems listed below.  Physiatrist and rehab team continue to assess barriers to discharge/monitor patient progress toward functional and medical goals  Care Tool:  Bathing  Bathing activity did not occur: Safety/medical concerns     Body parts bathed by helper: Right arm,Left arm,Chest,Abdomen,Front perineal area,Buttocks,Right upper leg,Left upper leg,Left lower leg,Right lower leg,Face     Bathing assist Assist Level: Total Assistance - Patient < 25%     Upper Body  Dressing/Undressing Upper body dressing Upper body dressing/undressing activity did not occur (including orthotics): Safety/medical concerns What is the patient wearing?: Hospital gown only    Upper body assist Assist Level: Total Assistance - Patient < 25%    Lower Body Dressing/Undressing Lower body dressing      What is the patient wearing?: Incontinence brief     Lower body assist Assist for lower body dressing: Dependent - Patient 0%     Toileting Toileting Toileting Activity did not occur (Clothing management and hygiene only): N/A (no void or bm)  Toileting assist Assist for toileting: Dependent - Patient 0% (incontinent)     Transfers Chair/bed transfer  Transfers assist  Chair/bed transfer activity did not occur: Safety/medical concerns        Locomotion Ambulation   Ambulation assist   Ambulation activity did not occur: Safety/medical concerns          Walk 10 feet activity   Assist  Walk 10 feet activity did not occur: Safety/medical concerns        Walk 50 feet activity   Assist Walk 50 feet with 2 turns activity did not occur: Safety/medical concerns         Walk 150 feet activity   Assist Walk  150 feet activity did not occur: Safety/medical concerns         Walk 10 feet on uneven surface  activity   Assist Walk 10 feet on uneven surfaces activity did not occur: Safety/medical concerns         Wheelchair     Assist Will patient use wheelchair at discharge?: Yes (pt may benefit from [facility] assessment however unsafe to attempt this AM; per PT long term goals)   Wheelchair activity did not occur: Safety/medical concerns         Wheelchair 50 feet with 2 turns activity    Assist    Wheelchair 50 feet with 2 turns activity did not occur: Safety/medical concerns       Wheelchair 150 feet activity     Assist  Wheelchair 150 feet activity did not occur: Safety/medical concerns       Blood pressure  112/76, pulse 78, temperature 97.8 F (36.6 C), resp. rate 18, height 5\' 7"  (1.702 m), weight 57.3 kg, SpO2 96 %.  Medical Problem List and Plan: 1.  Right side weakness with decreased functional mobility secondary to delirium and acute metabolic encephalopathy due to status epilepticus             -patient may shower             -ELOS/Goals: mod A 2-3 weeks+             -Continue CIR PT, OT, SLP  - Advanced WM changes on MRI 4/12 likely related to SVD. Pt + for cocaine on admit.    -slow progess at this point d/t significant cognitive-linguistic deficis 2.  Impaired mobility -DVT/anticoagulation: Continue Lovenox             -antiplatelet therapy: Aspirin 325 mg 3. Pain Management: Tylenol as needed 4. Agitation/severely distracted: Antipsychotic: Seroquel 50 mg TID ordered as agitation impacted ability to participate in therapy.  -continue propranolol  - Continue bilateral mittens as needed  -4/22   -continue seroquel---dc mid day dose   -urine, labs really unremarkable     -limit neurosedating meds as possible   -increase ritalin to 10mg  bid 5. Neuropsych: This patient is capable of making decisions on her own behalf. 6. Skin/Wound Care: Routine skin checks 7. Fluids/Electrolytes/Nutrition:    -I personally reviewed the patient's labs today.    4/22 intake sporadic   -needs full supervision with feeding   -protein supps for low albumin 8.  Seizure disorder.  Vimpat 200 mg twice daily, valproic acid 1000 mg twice daily.  EEG negative 9.  Hypertension.  Norvasc 10 mg daily     4/22 normotensive, continue lopressor 25mg  bid 10.  History of bilateral cerebral convexity infarction frontal region.  CIR 08/19/2019-08/27/2019 11.  Hyperlipidemia.  Continue Lipitor 12.  History of polysubstance abuse.  Urine drug screen positive cocaine.  Counseling 13. AKI: creatinine normal on 4/14. Some elevation d/t prerenal  14. UTI: UCX never performed.     -f/u urine cx negative 15. Dysphagia:      -4/19 diet downgraded to D1, thins  -continue and advance as clinically appropriate LOS: 7 days A FACE TO FACE EVALUATION WAS PERFORMED  10/27/2019 09/07/2020, 10:22 AM

## 2020-09-07 NOTE — Progress Notes (Signed)
Occupational Therapy Weekly Progress Note  Patient Details  Name: Cindy [name] MRN: [id number] Date of Birth: 1961/05/06  Beginning of progress report period: August 31, 2020 End of progress report period: September 07, 2020  Today's Date: 09/07/2020 OT Individual Time: 1761-6073 OT Individual Time Calculation (min): 42 min    Patient has met 1 of 4 short term goals.  Pt has made little progress this reporting period. Pt continues to require total care with +2 A d/t impaired low level cognition, internal/external distractions, hallucinations, restlessness, and poor motor planning. Pt becomes severely agitated with physical assistance/overstimulation and with ADLs d/t modesty (?). Pt agitation has been improved with calming music/sounds. Unsure if family will be able to provide this level of care at home and pt is not able to tolerate current schedule. May consider changing to 15/7 as needed in the following reporting period  Patient continues to demonstrate the following deficits: muscle weakness, decreased cardiorespiratoy endurance, impaired timing and sequencing, abnormal tone, unbalanced muscle activation, motor apraxia, ataxia, decreased coordination and decreased motor planning, decreased midline orientation, decreased attention to left and decreased attention to right, decreased initiation, decreased attention, decreased awareness, decreased problem solving, decreased safety awareness, decreased memory and delayed processing and decreased sitting balance, decreased standing balance, decreased postural control, hemiplegia and decreased balance strategies and therefore will continue to benefit from skilled OT intervention to enhance overall performance with BADL and iADL.  Patient progressing toward long term goals..  Continue plan of care.  OT Short Term Goals Week 1:  OT Short Term Goal 1 (Week 1): Pt will demonstrate improved attention to grooming task for >1 min with mod A. OT Short Term  Goal 1 - Progress (Week 1): Not met OT Short Term Goal 2 (Week 1): Pt will tolerate sitting EOB with mod A for >2 min. OT Short Term Goal 2 - Progress (Week 1): Met OT Short Term Goal 3 (Week 1): Pt will don shirt max A. OT Short Term Goal 3 - Progress (Week 1): Not met OT Short Term Goal 4 (Week 1): Pt will self-feed 25% of meal with mod A. OT Short Term Goal 4 - Progress (Week 1): Not met Week 2:  OT Short Term Goal 1 (Week 2): Pt will self feed 25% of meals wiht MOD HOH A OT Short Term Goal 2 (Week 2): Pt will focus attention to clinician when name is called 25% of trials OT Short Term Goal 3 (Week 2): Pt will complete 1 step of UB dressing wiht MOD HOH A OT Short Term Goal 4 (Week 2): Pt will tolerate OOB activity 1x daily  Skilled Therapeutic Interventions/Progress Updates:    1:1. Pt greeted with NT setting up breakfast. OT initially providing total A for self feeding d/t internal and external distractions. Ot removes L mit and provides HOH A to mouth 1 successful trial with red foam handle on spoon despite multiple attempts with pt puhing back from OT. Pt seemingly agitated by silence and increasing oral holding therefore terminated feeding and played calming music. Pt VERY restless in bed and pulls covers off pt. Pt kicking at OT and yelling at OT who distanced proximity to pt as presence is agitating. OT exited room across hallway where still able to see pt is safe. With increased time pt calms. OT provides pillow behind head and rasies knees of bed to decrease risk of pt scooting to foot of bed. Posey belt in place. NT aware of positioning.  Therapy Documentation Precautions:  Precautions Precautions: Fall Precaution Comments: bilateral mitts and bedrails padded Restrictions Weight Bearing Restrictions: No General:   Vital Signs: Therapy Vitals Temp: 97.8 F (36.6 C) Pulse Rate: 78 Resp: 18 BP: 112/76 Patient Position (if appropriate): Lying Oxygen Therapy SpO2: 96 % O2  Device: Room Air Pain:   ADL: ADL Eating: Unable to assess Grooming: Unable to assess Upper Body Bathing: Unable to assess Lower Body Bathing: Unable to assess Upper Body Dressing: Unable to assess Lower Body Dressing: Dependent Where Assessed-Lower Body Dressing: Bed level Toileting: Unable to assess Toilet Transfer: Unable to assess Tub/Shower Transfer: Unable to assess Gaffer Transfer: Unable to assess Vision   Perception    Praxis   Exercises:   Other Treatments:     Therapy/Group: Individual Therapy  Tonny Branch 09/07/2020, 6:40 AM

## 2020-09-08 MED ORDER — DIVALPROEX SODIUM 125 MG PO CSDR
750.0000 mg | DELAYED_RELEASE_CAPSULE | Freq: Three times a day (TID) | ORAL | Status: DC
  Administered 2020-09-08 – 2020-09-18 (×30): 750 mg via ORAL
  Filled 2020-09-08 (×31): qty 6

## 2020-09-08 MED ORDER — POTASSIUM CHLORIDE CRYS ER 20 MEQ PO TBCR: 20.0000 meq | EXTENDED_RELEASE_TABLET | Freq: Every day | ORAL | Status: DC

## 2020-09-08 MED ORDER — POTASSIUM CHLORIDE CRYS ER 20 MEQ PO TBCR
20.0000 meq | EXTENDED_RELEASE_TABLET | Freq: Two times a day (BID) | ORAL | Status: DC
  Administered 2020-09-08 – 2020-10-04 (×53): 20 meq via ORAL
  Filled 2020-09-08 (×54): qty 1

## 2020-09-08 NOTE — Progress Notes (Signed)
SLP Cancellation Note  Patient Details Name: Cindy [name] MRN: [id number] DOB: 1961/04/04   Cancelled treatment:       Patient missed 30 minutes of skilled SLP intervention due to fatigue and unable to maintain arousal for participation. Will re-attempt as able.                                                                                                 Eluzer Howdeshell 09/08/2020, 12:50 PM

## 2020-09-08 NOTE — Progress Notes (Signed)
PROGRESS NOTE   Subjective/Complaints: Became very agitated yesterday afternoon into the evening. Responded to 1mg  haldol given. Restless, agitated, hallucinating this morning (not to extent of yesterday)  ROS: Limited due to cognitive/behavioral     Objective:   No results found. No results for input(s): WBC, HGB, HCT, PLT in the last 72 hours. Recent Labs    09/07/20 0704  NA 138  K 3.0*  CL 104  CO2 26  GLUCOSE 103*  BUN 21  CREATININE 1.24*  CALCIUM 9.3    Intake/Output Summary (Last 24 hours) at 09/08/2020 1018 Last data filed at 09/07/2020 1254 Gross per 24 hour  Intake --  Output 1 ml  Net -1 ml        Physical Exam: Vital Signs Blood pressure 120/64, pulse 64, temperature 99.6 F (37.6 C), temperature source Oral, resp. rate 16, height 5\' 7"  (1.702 m), weight 54.6 kg, SpO2 100 %.  Constitutional: No distress . Vital signs reviewed. HEENT: EOMI, oral membranes moist Neck: supple Cardiovascular: RRR without murmur. No JVD    Respiratory/Chest: CTA Bilaterally without wheezes or rales. Normal effort    GI/Abdomen: BS +, non-tender, non-distended Ext: no clubbing, cyanosis, or edema Psych: resltess, dilusional, hallucinating.   Neuro:  moves all 4's, feeds with cueing.  Confused, dysarthric, ?aphasic  Assessment/Plan: 1. Functional deficits which require 3+ hours per day of interdisciplinary therapy in a comprehensive inpatient rehab setting.  Physiatrist is providing close team supervision and 24 hour management of active medical problems listed below.  Physiatrist and rehab team continue to assess barriers to discharge/monitor patient progress toward functional and medical goals  Care Tool:  Bathing  Bathing activity did not occur: Safety/medical concerns     Body parts bathed by helper: Right arm,Left arm,Chest,Abdomen,Front perineal area,Buttocks,Right upper leg,Left upper leg,Left lower  leg,Right lower leg,Face     Bathing assist Assist Level: Total Assistance - Patient < 25%     Upper Body Dressing/Undressing Upper body dressing Upper body dressing/undressing activity did not occur (including orthotics): Safety/medical concerns What is the patient wearing?: Hospital gown only    Upper body assist Assist Level: Total Assistance - Patient < 25%    Lower Body Dressing/Undressing Lower body dressing      What is the patient wearing?: Incontinence brief     Lower body assist Assist for lower body dressing: Dependent - Patient 0%     Toileting Toileting Toileting Activity did not occur (Clothing management and hygiene only): N/A (no void or bm)  Toileting assist Assist for toileting: Dependent - Patient 0% (incontinent)     Transfers Chair/bed transfer  Transfers assist  Chair/bed transfer activity did not occur: Safety/medical concerns        Locomotion Ambulation   Ambulation assist   Ambulation activity did not occur: Safety/medical concerns          Walk 10 feet activity   Assist  Walk 10 feet activity did not occur: Safety/medical concerns        Walk 50 feet activity   Assist Walk 50 feet with 2 turns activity did not occur: Safety/medical concerns         Walk 150 feet  activity   Assist Walk 150 feet activity did not occur: Safety/medical concerns         Walk 10 feet on uneven surface  activity   Assist Walk 10 feet on uneven surfaces activity did not occur: Safety/medical concerns         Wheelchair     Assist Will patient use wheelchair at discharge?: Yes (pt may benefit from [facility] assessment however unsafe to attempt this AM; per PT long term goals)   Wheelchair activity did not occur: Safety/medical concerns         Wheelchair 50 feet with 2 turns activity    Assist    Wheelchair 50 feet with 2 turns activity did not occur: Safety/medical concerns       Wheelchair 150 feet activity      Assist  Wheelchair 150 feet activity did not occur: Safety/medical concerns       Blood pressure 120/64, pulse 64, temperature 99.6 F (37.6 C), temperature source Oral, resp. rate 16, height 5\' 7"  (1.702 m), weight 54.6 kg, SpO2 100 %.  Medical Problem List and Plan: 1.  Right side weakness with decreased functional mobility secondary to delirium and acute metabolic encephalopathy due to status epilepticus             -patient may shower             -ELOS/Goals: mod A 2-3 weeks+             -Continue CIR therapies including PT, OT, and SLP   - Advanced WM changes on MRI 4/12 likely related to SVD. Pt + for cocaine on admit.    -slow progess at this point d/t significant cognitive-linguistic deficis 2.  Impaired mobility -DVT/anticoagulation: Continue Lovenox             -antiplatelet therapy: Aspirin 325 mg 3. Pain Management: Tylenol as needed 4. Agitation/severely distracted: Antipsychotic: Seroquel 50 mg TID ordered as agitation impacted ability to participate in therapy.  -continue propranolol  - Continue bilateral mittens as needed  -4/23   -resumed seroquel 25mg  tid   -dc ritalin, may have increased agitation   -room to increase depakote--change to sprinkles 750mg  tid 5. Neuropsych: This patient is capable of making decisions on her own behalf. 6. Skin/Wound Care: Routine skin checks 7. Fluids/Electrolytes/Nutrition:    --needs full supervision with feeding   -protein supps for low albumin   -supplement potassium 8.  Seizure disorder.  Vimpat 200 mg twice daily, valproic acid 1000 mg twice daily.  EEG negative 9.  Hypertension.  Norvasc 10 mg daily     4/22 normotensive, continue lopressor 25mg  bid 10.  History of bilateral cerebral convexity infarction frontal region.  CIR 08/19/2019-08/27/2019 11.  Hyperlipidemia.  Continue Lipitor 12.  History of polysubstance abuse.  Urine drug screen positive cocaine.  Counseling 13. AKI: creatinine normal on 4/14. Some  elevation d/t prerenal  14. UTI: UCX never performed.     -f/u urine cx negative 15. Dysphagia:     -4/19 diet downgraded to D1, thins  -continue and advance as clinically appropriate LOS: 8 days A FACE TO FACE EVALUATION WAS PERFORMED  10/19/2019 09/08/2020, 10:18 AM

## 2020-09-08 NOTE — Plan of Care (Signed)
  Problem: RH BOWEL ELIMINATION Goal: RH STG MANAGE BOWEL W/MEDICATION W/ASSISTANCE Description: STG Manage Bowel with Medication with min Assistance. Outcome: Progressing   Problem: RH SKIN INTEGRITY Goal: RH STG MAINTAIN SKIN INTEGRITY WITH ASSISTANCE Description: STG Maintain Skin Integrity With min Assistance. Outcome: Progressing Goal: RH STG ABLE TO PERFORM INCISION/WOUND CARE W/ASSISTANCE Description: STG Able To Perform Incision/Wound Care With min Assistance. Outcome: Progressing   Problem: RH PAIN MANAGEMENT Goal: RH STG PAIN MANAGED AT OR BELOW PT'S PAIN GOAL Description: <3 on a 0-10 pain scale. Outcome: Progressing   Problem: Consults Goal: RH STROKE PATIENT EDUCATION Description: See Patient Education module for education specifics  Outcome: Not Progressing   Problem: RH BOWEL ELIMINATION Goal: RH STG MANAGE BOWEL WITH ASSISTANCE Description: STG Manage Bowel with min Assistance. Outcome: Not Progressing   Problem: RH BLADDER ELIMINATION Goal: RH STG MANAGE BLADDER WITH ASSISTANCE Description: STG Manage Bladder With min Assistance Outcome: Not Progressing   Problem: RH SAFETY Goal: RH STG ADHERE TO SAFETY PRECAUTIONS W/ASSISTANCE/DEVICE Description: STG Adhere to Safety Precautions With min Assistance/Device. Outcome: Not Progressing Goal: RH STG DECREASED RISK OF FALL WITH ASSISTANCE Description: STG Decreased Risk of Fall With min Assistance. Outcome: Not Progressing

## 2020-09-09 NOTE — Plan of Care (Signed)
  Problem: RH Balance Goal: LTG Patient will maintain dynamic standing balance (PT) Description: LTG:  Patient will maintain dynamic standing balance with assistance during mobility activities (PT) Flowsheets (Taken 09/09/2020 1423) LTG: Pt will maintain dynamic standing balance during mobility activities with:: (Downgraded goal due ot slow progress, limited by lethargy and agitated behaviors/decreased awareness.) Moderate Assistance - Patient 50 - 74% Note: Downgraded goal due ot slow progress, limited by lethargy and agitated behaviors/decreased awareness.   Problem: Sit to Stand Goal: LTG:  Patient will perform sit to stand with assistance level (PT) Description: LTG:  Patient will perform sit to stand with assistance level (PT) Flowsheets (Taken 09/09/2020 1423) LTG: PT will perform sit to stand in preparation for functional mobility with assistance level: (Downgraded goal due ot slow progress, limited by lethargy and agitated behaviors/decreased awareness.) Moderate Assistance - Patient 50 - 74% Note: Downgraded goal due ot slow progress, limited by lethargy and agitated behaviors/decreased awareness.   Problem: RH Bed Mobility Goal: LTG Patient will perform bed mobility with assist (PT) Description: LTG: Patient will perform bed mobility with assistance, with/without cues (PT). Flowsheets (Taken 09/09/2020 1423) LTG: Pt will perform bed mobility with assistance level of: (Downgraded goal due ot slow progress, limited by lethargy and agitated behaviors/decreased awareness.) Minimal Assistance - Patient > 75% Note: Downgraded goal due ot slow progress, limited by lethargy and agitated behaviors/decreased awareness.   Problem: RH Bed to Chair Transfers Goal: LTG Patient will perform bed/chair transfers w/assist (PT) Description: LTG: Patient will perform bed to chair transfers with assistance (PT). Flowsheets (Taken 09/09/2020 1423) LTG: Pt will perform Bed to Chair Transfers with assistance  level: (Downgraded goal due ot slow progress, limited by lethargy and agitated behaviors/decreased awareness.) Moderate Assistance - Patient 50 - 74% Note: Downgraded goal due ot slow progress, limited by lethargy and agitated behaviors/decreased awareness.   Problem: RH Ambulation Goal: LTG Patient will ambulate in controlled environment (PT) Description: LTG: Patient will ambulate in a controlled environment, # of feet with assistance (PT). Flowsheets (Taken 09/09/2020 1423) LTG: Pt will ambulate in controlled environ  assist needed:: (Downgraded goal due ot slow progress, limited by lethargy and agitated behaviors/decreased awareness.) Moderate Assistance - Patient 50 - 74% LTG: Ambulation distance in controlled environment: 50 ft using LRAD Note: Downgraded goal due ot slow progress, limited by lethargy and agitated behaviors/decreased awareness.

## 2020-09-09 NOTE — Progress Notes (Signed)
Physical Therapy Weekly Progress Note  Patient Details  Name: Cindy [name] MRN: [id number] Date of Birth: 1960-07-03  Beginning of progress report period: September 01, 2020 End of progress report period: September 09, 2020  Today's Date: 09/09/2020 PT Individual Time: 0800-0939 PT Individual Time Calculation (min): 99 min   Patient has met 3 of 5 short term goals.  Patient with slow and intermittent progress this week, limited by lethargy, agitation, and decreased awareness/participation. Patient currently requires max A for bed mobility and transfers, max A for gait 5 feet, and requires full supervision when sitting in TIS w/c due to restlessness and poor trunk control.   Patient continues to demonstrate the following deficits muscle weakness, decreased cardiorespiratoy endurance, motor apraxia, decreased coordination and decreased motor planning, decreased midline orientation, decreased attention to left and left side neglect, decreased initiation, decreased attention, decreased awareness, decreased problem solving, decreased safety awareness, decreased memory and delayed processing and decreased sitting balance, decreased standing balance, decreased postural control, hemiplegia, decreased balance strategies and difficulty maintaining precautions and therefore will continue to benefit from skilled PT intervention to increase functional independence with mobility.  Patient not progressing toward long term goals.  See goal revision..  Plan of care revisions: Downgraded goals to mod A overall due ot slow progress, limited by lethargy and agitated behaviors/decreased awareness.  PT Short Term Goals Week 1:  PT Short Term Goal 1 (Week 1): pt to demonstrated supine<>sit max A x1 PT Short Term Goal 1 - Progress (Week 1): Met PT Short Term Goal 2 (Week 1): pt to demonstrate sitting balance 5 min at mod A x1 PT Short Term Goal 2 - Progress (Week 1): Met PT Short Term Goal 3 (Week 1): pt to demonstrate  bed<>chair transfers max A x1 PT Short Term Goal 3 - Progress (Week 1): Partly met (inconsistent) PT Short Term Goal 4 (Week 1): pt to tolerate sitting OOB 1 hour for improved participation with therapy PT Short Term Goal 4 - Progress (Week 1): Progressing toward goal PT Short Term Goal 5 (Week 1): Initiate gait training PT Short Term Goal 5 - Progress (Week 1): Met Week 2:  PT Short Term Goal 1 (Week 2): Patient will perform bed mobility with mod A >50% of the time. PT Short Term Goal 2 (Week 2): Patient will perform bed>chair transfers with max A consitently. PT Short Term Goal 3 (Week 2): Patient will perform standing balance >1 min with mod-max A. PT Short Term Goal 4 (Week 2): Patient will ambulate >25 feet with +2 assist.  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 Demonstrated improved activity tolerance, staff tolerance, and sitting tolerance during session this morning.  Patient in bed asleep upon PT arrival. Patient did not arouse to verbal or tactile stimulation and minimal arousal with initial mobility to the TIS w/c.   Transported patient to the Day room in front of the windows and blocked both side with barriers to limit stimulation. Played music by Cindy [name] and patient aroused, smiling. Allowed time for patient to maintain arousal with focus on music. Patient began to talk about breakfast and perseverated >5 min on stating that she does not eat breakfast at home and that she waits to eat until 1-2 o'clock. Patient participated in 1 activity seated in the w/c before becoming restless and requiring to  return to the room. Tolerated sitting out of the room 21 min.   Therapeutic Activity: Bed Mobility: Patient performed supine to/from sit with x2 with total A to come to sitting  initially due to lethargy, and mod-max A once patient was aroused for trunk and L lower extremity managment. Provided verbal cues for initiation and sequencing. Transfers: Patient performed squat pivot with total A bed>TIS w/c and sit to/from stand x1 with max A. Provided verbal cues for initiation with 1-2-3 count and forward weight shift, blocked L knee throughout.  Gait Training:  Patient ambulated 5 feet from TIS to the bed for increased motivation for mobility with max A with B UE support on therapist's arms. Ambulated with poor B foot clearance L>R requiring max facilitation for weight shifting. Provided verbal cues for initiation of stepping and sequencing.  Neuromuscular Re-ed: Patient performed the following activities: -seated in w/c passing medium beach ball back and forth to therapist 2x4-5, noted undershooting and poor awareness of L upper extremity with task -sitting balance x3, first trial with total A for increased arousal with facilitation of erect posture and cervical extension to neutral due to significant forward flexed posture, second and third trial with min-mod A for sitting balance, continues forward flexed posture with improved head control and slight R lean, able to self correct with L visula target x3 between the two trials -sit to/from stand x12 with max progressing to mod/max A using 1-2-3 count to initiate, patient very motivated by this task following short ambulation, see above, stating "I can do it"  Patient stated that she felt tired and agreeable to return to lying in the bed. Once Posey belt was in place the patient became very restless and progressed to agitated pulling at the belt and crying out. Noted poor frustration tolerance with her inability to remove the belt due to upper extremity apraxia. Patient fluctuated between mild agitation and tearful >15 min. PT provided safe guarding of patient's head and limbs due to poor motor control with attempts to get up or  remove the belt, also provided emotional support and education/orientation during bouts of tearfulness. Patient eventually returned to lying with mild restless movements. Donned B hand mitts, put on meditation music in the room and patient eventually lying calm in the bed and muttering to someone who was not in the room at end of session  Patient in bed at end of session with breaks locked, bed and posey belt alarm set, and all needs within reach.    Therapy Documentation Precautions:  Precautions Precautions: Fall Precaution Comments: bilateral mitts and bedrails padded Restrictions Weight Bearing Restrictions: No  Therapy/Group: Individual Therapy  Roxanne Orner L Halsey Hammen PT, DPT  09/09/2020, 12:17 PM

## 2020-09-09 NOTE — Progress Notes (Signed)
PROGRESS NOTE   Subjective/Complaints: Pt had a quieter night but still cycles between agitation and somnolence, largely d/t medications.   ROS: Limited due to cognitive/behavioral   Objective:   No results found. No results for input(s): WBC, HGB, HCT, PLT in the last 72 hours. Recent Labs    09/07/20 0704  NA 138  K 3.0*  CL 104  CO2 26  GLUCOSE 103*  BUN 21  CREATININE 1.24*  CALCIUM 9.3    Intake/Output Summary (Last 24 hours) at 09/09/2020 0820 Last data filed at 09/08/2020 1700 Gross per 24 hour  Intake 236 ml  Output --  Net 236 ml        Physical Exam: Vital Signs Blood pressure (!) 150/84, pulse 74, temperature 98.4 F (36.9 C), temperature source Axillary, resp. rate 16, height 5\' 7"  (1.702 m), weight 54.6 kg, SpO2 96 %.  Constitutional: No distress . Vital signs reviewed. HEENT: EOMI, oral membranes moist Neck: supple Cardiovascular: RRR without murmur. No JVD    Respiratory/Chest: CTA Bilaterally without wheezes or rales. Normal effort    GI/Abdomen: BS +, non-tender, non-distended Ext: no clubbing, cyanosis, or edema Psych: somnolent this morning.   Neuro:  moves all 4's, confused  Assessment/Plan: 1. Functional deficits which require 3+ hours per day of interdisciplinary therapy in a comprehensive inpatient rehab setting.  Physiatrist is providing close team supervision and 24 hour management of active medical problems listed below.  Physiatrist and rehab team continue to assess barriers to discharge/monitor patient progress toward functional and medical goals  Care Tool:  Bathing  Bathing activity did not occur: Safety/medical concerns     Body parts bathed by helper: Right arm,Left arm,Chest,Abdomen,Front perineal area,Buttocks,Right upper leg,Left upper leg,Left lower leg,Right lower leg,Face     Bathing assist Assist Level: Total Assistance - Patient < 25%     Upper Body  Dressing/Undressing Upper body dressing Upper body dressing/undressing activity did not occur (including orthotics): Safety/medical concerns What is the patient wearing?: Hospital gown only    Upper body assist Assist Level: Total Assistance - Patient < 25%    Lower Body Dressing/Undressing Lower body dressing      What is the patient wearing?: Incontinence brief     Lower body assist Assist for lower body dressing: Dependent - Patient 0%     Toileting Toileting Toileting Activity did not occur (Clothing management and hygiene only): N/A (no void or bm)  Toileting assist Assist for toileting: Dependent - Patient 0% (incontinent)     Transfers Chair/bed transfer  Transfers assist  Chair/bed transfer activity did not occur: Safety/medical concerns        Locomotion Ambulation   Ambulation assist   Ambulation activity did not occur: Safety/medical concerns          Walk 10 feet activity   Assist  Walk 10 feet activity did not occur: Safety/medical concerns        Walk 50 feet activity   Assist Walk 50 feet with 2 turns activity did not occur: Safety/medical concerns         Walk 150 feet activity   Assist Walk 150 feet activity did not occur: Safety/medical concerns  Walk 10 feet on uneven surface  activity   Assist Walk 10 feet on uneven surfaces activity did not occur: Safety/medical concerns         Wheelchair     Assist Will patient use wheelchair at discharge?: Yes (pt may benefit from [facility] assessment however unsafe to attempt this AM; per PT long term goals)   Wheelchair activity did not occur: Safety/medical concerns         Wheelchair 50 feet with 2 turns activity    Assist    Wheelchair 50 feet with 2 turns activity did not occur: Safety/medical concerns       Wheelchair 150 feet activity     Assist  Wheelchair 150 feet activity did not occur: Safety/medical concerns       Blood pressure (!)  150/84, pulse 74, temperature 98.4 F (36.9 C), temperature source Axillary, resp. rate 16, height 5\' 7"  (1.702 m), weight 54.6 kg, SpO2 96 %.  Medical Problem List and Plan: 1.  Right side weakness with decreased functional mobility secondary to delirium and acute metabolic encephalopathy due to status epilepticus             -patient may shower             -ELOS/Goals: mod A 2-3 weeks+             -Continue CIR therapies including PT, OT, and SLP   - Advanced WM changes on MRI 4/12 likely related to SVD. Pt + for cocaine on admit.    -likely will need to involve palliative care in this case. I will reach out to family this week 2.  Impaired mobility -DVT/anticoagulation: Continue Lovenox             -antiplatelet therapy: Aspirin 325 mg 3. Pain Management: Tylenol as needed 4. Agitation/severely distracted: Antipsychotic: Seroquel 50 mg TID ordered as agitation impacted ability to participate in therapy.  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 Consider zyprexa trial 5. Neuropsych: This patient is capable of making decisions on her own behalf. 6. Skin/Wound Care: Routine skin checks 7. Fluids/Electrolytes/Nutrition:    --needs full supervision with feeding   -protein supps for low albumin   -supplement potassium  4/24 recheck labs tomorrow 8.  Seizure disorder.  Vimpat 200 mg twice daily, valproic acid 1000 mg twice daily.  EEG negative 9.  Hypertension.  Norvasc 10 mg daily     4/24 normotensive, continue lopressor 25mg  bid 10.  History of bilateral cerebral convexity infarction frontal region.  CIR 08/19/2019-08/27/2019 11.  Hyperlipidemia.  Continue Lipitor 12.  History of  polysubstance abuse.  Urine drug screen positive cocaine.  Counseling 13. AKI: creatinine normal on 4/14. Some elevation d/t prerenal  14. UTI: UCX never performed.     -f/u urine cx negative 15. Dysphagia:     -4/19 diet downgraded to D1, thins  -continue and advance as clinically appropriate LOS: 9 days A FACE TO FACE EVALUATION WAS PERFORMED  10/19/2019 09/09/2020, 8:20 AM

## 2020-09-09 NOTE — Progress Notes (Signed)
SLP Cancellation Note  Patient Details Name: MONICA CODD MRN: [id number] DOB: [birth year]   Cancelled treatment:        Attempted to see pt for scheduled treatment session but pt was somnolent and could not awaken for more than a few seconds at a time despite environmental modifications and SLP's therapeutic use of self.  Will continue efforts at next available appointment.                                                                                                 Aimee Heldman, Melanee Spry 09/09/2020, 10:32 AM

## 2020-09-10 LAB — COMPREHENSIVE METABOLIC PANEL
ALT: 15 U/L (ref 0–44)
AST: 26 U/L (ref 15–41)
Albumin: 3.2 g/dL — ABNORMAL LOW (ref 3.5–5.0)
Alkaline Phosphatase: 57 U/L (ref 38–126)
Anion gap: 7 (ref 5–15)
BUN: 18 mg/dL (ref 8–23)
CO2: 23 mmol/L (ref 22–32)
Calcium: 9.2 mg/dL (ref 8.9–10.3)
Chloride: 108 mmol/L (ref 98–111)
Creatinine, Ser: 1.18 mg/dL — ABNORMAL HIGH (ref 0.44–1.00)
GFR, Estimated: 53 mL/min — ABNORMAL LOW (ref >60)
Glucose, Bld: 100 mg/dL — ABNORMAL HIGH (ref 70–99)
Potassium: 3.9 mmol/L (ref 3.5–5.1)
Sodium: 138 mmol/L (ref 135–145)
Total Bilirubin: 0.4 mg/dL (ref 0.3–1.2)
Total Protein: 7.1 g/dL (ref 6.5–8.1)

## 2020-09-10 LAB — CBC
HCT: 36.4 % (ref 36.0–46.0)
Hemoglobin: 12.1 g/dL (ref 12.0–15.0)
MCH: 30.8 pg (ref 26.0–34.0)
MCHC: 33.2 g/dL (ref 30.0–36.0)
MCV: 92.6 fL (ref 80.0–100.0)
Platelets: 442 10*3/uL — ABNORMAL HIGH (ref 150–400)
RBC: 3.93 MIL/uL (ref 3.87–5.11)
RDW: 12.3 % (ref 11.5–15.5)
WBC: 4.5 10*3/uL (ref 4.0–10.5)
nRBC: 0 % (ref 0.0–0.2)

## 2020-09-10 MED ORDER — PROPRANOLOL HCL 10 MG PO TABS
10.0000 mg | ORAL_TABLET | Freq: Every day | ORAL | Status: DC
  Administered 2020-09-10 – 2020-09-11 (×2): 10 mg via ORAL
  Filled 2020-09-10 (×2): qty 1

## 2020-09-10 NOTE — Progress Notes (Addendum)
Patient ID: TAMICKA SHIMON, female   DOB: [birth year], 60 y.o.   MRN: [id number]  SW spoke with [facility] [phone]) who reported she was the primary case manager working on the case and Rhae Lerner was covering for her while she was out on maternity leave. Reported pt was initially referred to the program for medication management and assistance since pt was uninsured. Reports that they were able to assist pt in getting Medicaid. PCP is Dr. Marlon Pel at [facility] in [place]. Did report concerns related to substance abuse, and pt and husband frequently not having enough money for medications.  Reported concerns on if pt were to return home, limited support as believes husband has good intentions, but unsure on his level of consistency. SW encouraged an APS referral as this SW has no supportive evidence to indicate there would be neglect. SW to call to inform on when pt discharges from hospital.   SW received call from [facility]  [phone] cell/[phone] office) who gave reports since she was covering for Maci. Supports the same information above. Reports inconsistent with medication compliance in which they do not pick up medications. Recognize there is a substance abuse issue for both parties. She was not knowledgeable of him having any issues with driving.   SW asked both individuals if they speak or visit pt husband at home to ask him to call this SW since unable to make contact.   *SW met with pt husband Cary in room while pt in OT session. SW provided hsuabnd with HHA list, and PCS list. SW discussed with husband amount of care pt will require, and he states that he can provide the care. OT mentioned SNF placement for pt. SW informed him that if SNF, pt may not receive therapy. Pt husband encouraged to come in for family education tomorrow. Reports he will be here around 1pm. SW encouraged him to  call nurses station to get pt therapy schedule.   Loralee Pacas, MSW, [place] Office: [phone] Cell: [phone] Fax: [phone]

## 2020-09-10 NOTE — Progress Notes (Signed)
Speech Language Pathology Daily Session Note  Patient Details  Name: Cindy [name] MRN: [id number] Date of Birth: [birth year]  Today's Date: 09/10/2020 SLP Individual Time: 7622-6333 SLP Individual Time Calculation (min): 15 min  Missed Time: 45 minutes, fatigue   Short Term Goals: Week 2: SLP Short Term Goal 1 (Week 2): Patient will focus attention to verbal or tactile stimuli in 25% of opportunities with Max A multimodal cues. SLP Short Term Goal 2 (Week 2): Patient will respond to basic yes/no questions in 25% of opportunities with Max A multimodal cues. SLP Short Term Goal 3 (Week 2): Patient will follow 1-step commands in 25% of opportunities with Max A multimodal cues. SLP Short Term Goal 4 (Week 2): Patient will be ~50% intelligibile at the phrase level with Max A multimodal cues. SLP Short Term Goal 5 (Week 2): Patient will consume current diet of Dys. 1 textures with thin liquids with minimal overt s/s of aspiration and Max A multimodal cues for use of swallowing compensatory strategies.  Skilled Therapeutic Interventions: Skilled treatment session focused on cognitive goals. Upon arrival, patient was awake while supine in bed. Patient initially responsive to questions although responses were essentially unintelligible. Although patient was sitting upright in bed and repositioned for arousal, patient was falling asleep and was unable to maintain arousal, therefore, patient missed 45 minutes of skilled SLP intervention. Continue with current plan of care.      Pain No/Denies Pain   Therapy/Group: Individual Therapy  Kayven Aldaco 09/10/2020, 12:45 PM

## 2020-09-10 NOTE — Progress Notes (Signed)
PROGRESS NOTE   Subjective/Complaints: Very agitated this morning. Discussed with Cindy [name]- may be due to wet diaper Cr 1.18, trending downward Low albumin  ROS: Limited due to cognitive/behavioral   Objective:   No results found. Recent Labs    09/10/20 0454  WBC 4.5  HGB 12.1  HCT 36.4  PLT 442*   Recent Labs    09/10/20 0454  NA 138  K 3.9  CL 108  CO2 23  GLUCOSE 100*  BUN 18  CREATININE 1.18*  CALCIUM 9.2    Intake/Output Summary (Last 24 hours) at 09/10/2020 1225 Last data filed at 09/10/2020 0700 Gross per 24 hour  Intake 118 ml  Output --  Net 118 ml        Physical Exam: Vital Signs Blood pressure (!) 145/84, pulse 73, temperature 98 F (36.7 C), temperature source Axillary, resp. rate 19, height 5\' 7"  (1.702 m), weight 55.4 kg, SpO2 100 %.  Gen: no distress, normal appearing HEENT: oral mucosa pink and moist, NCAT Cardio: Reg rate Chest: normal effort, normal rate of breathing Abd: soft, non-distended Ext: no edema Psych: agitated this morning.   Neuro:  moves all 4's, confused  Assessment/Plan: 1. Functional deficits which require 3+ hours per day of interdisciplinary therapy in a comprehensive inpatient rehab setting.  Physiatrist is providing close team supervision and 24 hour management of active medical problems listed below.  Physiatrist and rehab team continue to assess barriers to discharge/monitor patient progress toward functional and medical goals  Care Tool:  Bathing  Bathing activity did not occur: Safety/medical concerns     Body parts bathed by helper: Right arm,Left arm,Chest,Abdomen,Front perineal area,Buttocks,Right upper leg,Left upper leg,Left lower leg,Right lower leg,Face     Bathing assist Assist Level: Total Assistance - Patient < 25%     Upper Body Dressing/Undressing Upper body dressing Upper body dressing/undressing activity did not occur (including  orthotics): Safety/medical concerns What is the patient wearing?: Hospital gown only    Upper body assist Assist Level: Total Assistance - Patient < 25%    Lower Body Dressing/Undressing Lower body dressing      What is the patient wearing?: Incontinence brief     Lower body assist Assist for lower body dressing: Dependent - Patient 0%     Toileting Toileting Toileting Activity did not occur (Clothing management and hygiene only): N/A (no void or bm)  Toileting assist Assist for toileting: Dependent - Patient 0% (incontinent)     Transfers Chair/bed transfer  Transfers assist  Chair/bed transfer activity did not occur: Safety/medical concerns  Chair/bed transfer assist level: Total Assistance - Patient < 25%     Locomotion Ambulation   Ambulation assist   Ambulation activity did not occur: Safety/medical concerns  Assist level: Maximal Assistance - Patient 25 - 49%   Max distance: 5 ft   Walk 10 feet activity   Assist  Walk 10 feet activity did not occur: Safety/medical concerns        Walk 50 feet activity   Assist Walk 50 feet with 2 turns activity did not occur: Safety/medical concerns         Walk 150 feet activity   Assist  Walk 150 feet activity did not occur: Safety/medical concerns         Walk 10 feet on uneven surface  activity   Assist Walk 10 feet on uneven surfaces activity did not occur: Safety/medical concerns         Wheelchair     Assist Will patient use wheelchair at discharge?: Yes (pt may benefit from [facility] assessment however unsafe to attempt this AM; per PT long term goals)   Wheelchair activity did not occur: Safety/medical concerns         Wheelchair 50 feet with 2 turns activity    Assist    Wheelchair 50 feet with 2 turns activity did not occur: Safety/medical concerns       Wheelchair 150 feet activity     Assist  Wheelchair 150 feet activity did not occur: Safety/medical  concerns       Blood pressure (!) 145/84, pulse 73, temperature 98 F (36.7 C), temperature source Axillary, resp. rate 19, height 5\' 7"  (1.702 m), weight 55.4 kg, SpO2 100 %.  Medical Problem List and Plan: 1.  Right side weakness with decreased functional mobility secondary to delirium and acute metabolic encephalopathy due to status epilepticus             -patient may shower             -ELOS/Goals: mod A 2-3 weeks+             -Continue CIR therapies including PT, OT, and SLP   - Advanced WM changes on MRI 4/12 likely related to SVD. Pt + for cocaine on admit.    -likely will need to involve palliative care in this case. I will reach out to family this week 2.  Impaired mobility -DVT/anticoagulation: Continue Lovenox             -antiplatelet therapy: Aspirin 325 mg 3. Pain Management: Tylenol as needed 4. Agitation/severely distracted: Antipsychotic: Seroquel 50 mg TID ordered as agitation impacted ability to participate in therapy.  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 Consider zyprexa trial 5. Neuropsych: This patient is capable of making decisions on her own behalf. 6. Skin/Wound Care: Routine skin checks 7. Fluids/Electrolytes/Nutrition:    --needs full supervision with feeding   -protein supps for low albumin   -continue supplement potassium  4/25: potassium normalized.  8.  Seizure disorder.  Vimpat 200 mg twice daily, valproic acid 1000 mg twice daily.  EEG negative 9.  Hypertension.  Norvasc 10 mg daily     4/24 normotensive, continue lopressor 25mg  bid 10.  History of bilateral cerebral convexity infarction frontal region.  CIR  08/19/2019-08/27/2019 11.  Hyperlipidemia.  Continue Lipitor 12.  History of polysubstance abuse.  Urine drug screen positive cocaine.  Counseling 13. AKI: creatinine normal on 4/14. Some elevation d/t prerenal  14. UTI: UCX never performed.     -f/u urine cx negative 15. Dysphagia:     -4/19 diet downgraded to D1, thins  -continue and advance as clinically appropriate LOS: 10 days A FACE TO FACE EVALUATION WAS PERFORMED  Elester Apodaca 09/10/2020, 12:25 PM

## 2020-09-10 NOTE — Progress Notes (Signed)
Occupational Therapy Session Note  Patient Details  Name: Cindy [name] MRN: [id number] Date of Birth: 10-20-1960  Today's Date: 09/10/2020 OT Individual Time: 1400-1500 OT Individual Time Calculation (min): 60 min    Short Term Goals: Week 2:  OT Short Term Goal 1 (Week 2): Pt will self feed 25% of meals wiht MOD HOH A OT Short Term Goal 2 (Week 2): Pt will focus attention to clinician when name is called 25% of trials OT Short Term Goal 3 (Week 2): Pt will complete 1 step of UB dressing wiht MOD HOH A OT Short Term Goal 4 (Week 2): Pt will tolerate OOB activity 1x daily  Skilled Therapeutic Interventions/Progress Updates:    Pt received supine, hallucinating, muttering to herself, and very restless. Shortly after OT arrival pt's fiance arrived. He spoke with pt briefly and after 5 min reported he would leave. RN asked him to stay for CSW to arrive and discuss d/c. Pt was transferred to EOB with total A. She required max A to maintain sitting balance. Demonstrated this to pt's fiance who stated "she is getting better". Had very frank conversation with CSW present about pt's lack of progress and likely SNF recommendation d/t very high burden of care. He would not make eye contact with OT and was looking out the window as OT demonstrated max-total A stand pivot transfer, stating "I could do that at home". It would absolutely be unsafe for anyone to transfer pt at this level of care. In the TIS w/c pt was unable to maintain sitting balance, leaning forward unless chair was tilted backward. Pt minimally responsive at this time but resting more comfortably. She was taken to large windows off unit to provide orientation and to look outside. Pt became more alert gradually. Ended session with 55 ft of functional mobiltiy with max +2 assist but with pt initiating all steps. She required max cueing and facilitation for scissoring and posterior/R lean. Pt was returned to room. She was left supine- posey belt  on but NOT alarmed- nursing staff aware. Bed alarm set. B mitts donned.   Therapy Documentation Precautions:  Precautions Precautions: Fall Precaution Comments: bilateral mitts and bedrails padded Restrictions Weight Bearing Restrictions: No Therapy/Group: Individual Therapy  Crissie Reese 09/10/2020, 6:28 AM

## 2020-09-10 NOTE — Progress Notes (Signed)
Physical Therapy Note  Patient Details  Name: Cindy [name] MRN: [id number] Date of Birth: August 20, 1960 Today's Date: 09/10/2020    Patient in bed with NT in the room. Patient crying out "leave me alone!" NT left the room, lights were turned off, blinds closed, TV turned off, and meditation music put on computer to promote a low stimulation environment. Patient perseverative of yelling "leave me alone." PT unable to redirect use of calm tone. Will attempt to see patient at later time when behaviors are more redirectable for therapeutic interventions. Patient in bed with breaks locke, posey belt and bed alarm turned on, B hand mitts in place, floor mats down, and call bell in reach. Patient missed 60 min of skilled PT due to agitation/confusion and unable to redirect to therapeutic interventions, RN made aware. Will attempt to make-up missed time as able.     Cindy [name] L Kadin Bera PT, DPT  09/10/2020, 8:24 AM

## 2020-09-11 ENCOUNTER — Other Ambulatory Visit: Payer: Self-pay

## 2020-09-11 MED ORDER — OLANZAPINE 5 MG PO TBDP
2.5000 mg | ORAL_TABLET | Freq: Every day | ORAL | Status: DC
  Administered 2020-09-11: 2.5 mg via ORAL
  Filled 2020-09-11: qty 0.5

## 2020-09-11 MED ORDER — QUETIAPINE FUMARATE 25 MG PO TABS
25.0000 mg | ORAL_TABLET | Freq: Two times a day (BID) | ORAL | Status: DC
  Administered 2020-09-11 – 2020-09-13 (×4): 25 mg via ORAL
  Filled 2020-09-11 (×4): qty 1

## 2020-09-11 NOTE — Progress Notes (Signed)
Nutrition Follow-up  DOCUMENTATION CODES:   Not applicable  INTERVENTION:   If poor PO intake continues, recommend Cortrak NG tube placement and initiation of enteral nutrition. - Osmolite 1.5 @ 55 ml/hr (1320 ml/day) - ProSource TF 45 ml daily  Recommended tube feeding regimen would provide 2020 kcal, 94 grams of protein, and 1006 ml of H2O.   - Continue Ensure Enlive po TID, each supplement provides 350 kcal and 20 grams of protein  - Continue Magic Cup TID with meals, each supplement provides 290 kcal and 9 grams of protein  - Continue MVI with minerals daily  - Continue feeding assistance with all meals  NUTRITION DIAGNOSIS:   Increased nutrient needs related to other (therapies) as evidenced by estimated needs.  Ongoing, being addressed via supplements  GOAL:   Patient will meet greater than or equal to 90% of their needs  Progressing  MONITOR:   PO intake,Supplement acceptance,Weight trends,Labs,I & O's,Diet advancement  REASON FOR ASSESSMENT:   Malnutrition Screening Tool    ASSESSMENT:   Pt with a PMH significant for HTN, HLD, bilateral cerebral convexity infarct frontal region receiving CIR 08/19/19-08/27/19, seizure with prior history of status epilepticus, and polysubstance abuse presented 08/23/2020 with AMS/slurred speech, R-sided weakness as well as reports of shaking and jerking episodes lasting 5-10 minutes. Cranial CT/MRI scan was performed and was negative for acute changes but did show remote lacunar infarct. EEG suggestive of cortical dysfunction arising from left hemisphere likely 2/2 underlying structural abnormality post ictal state no seizure or definite epileptiform discharges. Admitted to CIR 4/15.   Pt's PO intake has been variable, suspect this is secondary to agitation and lethargy per review of notes. Pt's weight has fluctuated between 119-126 lbs since 4/20. Current weight is 119 lbs.  Pt has experienced a 4.6% weight loss since 08/23/20 which  is severe and significant for timeframe.  Discussed pt with RN who reports pt is not taking supplements and that it is very hard to just get pt to eat at mealtimes due to ongoing hallucinations.  If poor PO intake continues, recommend Cortrak NG tube placement and intiation of enteral nutrition. RD to leave tube feeding recommendations. Communicated recommendation with MD.  Meal Completion: 0-90% x last 8 documented meals (averaging 37%)  Medications reviewed and include: Ensure Enlive TID, folic acid, MVI with minerals, protonix, klor-con, thiamine  Labs reviewed: creatinine 1.18  NUTRITION - FOCUSED PHYSICAL EXAM:  Unable to complete at this time.  Diet Order:   Diet Order            DIET - DYS 1 Room service appropriate? Yes; Fluid consistency: Thin  Diet effective now                 EDUCATION NEEDS:   No education needs have been identified at this time  Skin:  Skin Assessment: Reviewed RN Assessment  Last BM:  09/08/20  Height:   Ht Readings from Last 1 Encounters:  09/03/20 5\' 7"  (1.702 m)    Weight:   Wt Readings from Last 1 Encounters:  09/11/20 54 kg    BMI:  Body mass index is 18.65 kg/m.  Estimated Nutritional Needs:   Kcal:  1800-2000  Protein:  90-100 grams  Fluid:  >1.8L    09/13/20, MS, RD, LDN Inpatient Clinical Dietitian Please see [facility] for contact information.

## 2020-09-11 NOTE — Patient Care Conference (Signed)
Inpatient RehabilitationTeam Conference and Plan of Care Update Date: 09/11/2020   Time: 10:20 AM    Patient Name: Cindy [name]      Medical Record Number: [id number]  Date of Birth: 10/13/1960 Sex: Female         Room/Bed: 4W08C/4W08C-01 Payor Info: Payor: MEDICAID [place] / Plan: MEDICAID [place] / Product Type: *No Product type* /    Admit Date/Time:  08/31/2020  5:12 PM  Primary Diagnosis:  Acute metabolic encephalopathy  Hospital Problems: Principal Problem:   Acute metabolic encephalopathy    Expected Discharge Date: Expected Discharge Date: 09/22/20  Team Members Present: Physician leading conference: Dr. Faith Rogue Care Coodinator Present: [name], LCSWA;Malisa Ruggiero Marlyne Beards, RN, BSN, CRRN Nurse Present: Kennyth Arnold, RN PT Present: Serina Cowper, PT OT Present: Jake Shark, OT SLP Present: Feliberto Gottron, SLP PPS Coordinator present : Edson Snowball, PT     Current Status/Progress Goal Weekly Team Focus  Bowel/Bladder   Patient is incontinent of bladderbowel LBM recorded 09/08/2020, sorbitol daily prn ordered  Restore continence of bladder/bowel  Continue time toileting Q 2 hrs   Swallow/Nutrition/ Hydration   Dys. 1 textures with thin liquids, Max A  Mod A  Safety and tolerance with current diet   ADL's   Has moments where she perks up but max A at best, overall max-total +2  min-mod A overall  Initiating, ADL retraining, automatic motor plans   Mobility   Max A of 1-2 overall, gait 5 ft max A  Mod A overall  Functional mobility, activity tolerance, behavior managment, attention, safety awareness, midline orientation, balance, sitting tolerance, caregiver education   Communication   Max A  Mod A  increased speech intelligibility   Safety/Cognition/ Behavioral Observations  Total A  Max A  focused attention, following commands   Pain   No c/o  <=3  QS/PRN assessment   Skin   Intact skin  Assess QS/PRN, notify if new areas in skin occurrance         Discharge Planning:  Pt to discharge to home with her husband who will provide 24/7 care.   Team Discussion: Poor prognosis, major cognitive deficits, frequently agitated then somnolent at times. Incontinent of B/B, unable to assess if patient has pain, patient frequently screams out at night and doesn't sleep. No skin issues reported.  Patient on target to meet rehab goals: no, she is a total assist for all transfers. She has ambulated 5 ft with max assist. Overall with ADL's she is a max-total +2 assist. She is on Dys 1 with thin liquids and max assist.   *See Care Plan and progress notes for long and short-term goals.   Revisions to Treatment Plan:  Not at this time.  Teaching Needs: Family education, medication management, pain management, behavior management, skin/wound care, transfer training, gait training, balance training, endurance training, safety awareness.  Current Barriers to Discharge: Decreased caregiver support, Medical stability, Home enviroment access/layout, Incontinence, Lack of/limited family support, Insurance for SNF coverage, Medication compliance, Behavior and Nutritional means  Possible Resolutions to Barriers: Continue current medications, continue behavior management, offer nutritional supplements, provide emotional support.     Medical Summary Current Status: profound cognitive deficits. frequently agitated but then somnolent at times. poor prognosis  Barriers to Discharge: Medical stability;Behavior   Possible Resolutions to [name]: continue mgt of behavior, sleep, nutrition   Continued Need for Acute Rehabilitation Level of Care: The patient requires daily medical management by a physician with specialized training in physical medicine  and rehabilitation for the following reasons: Direction of a multidisciplinary physical rehabilitation program to maximize functional independence : Yes Medical management of patient stability for  increased activity during participation in an intensive rehabilitation regime.: Yes Analysis of laboratory values and/or radiology reports with any subsequent need for medication adjustment and/or medical intervention. : Yes   I attest that I was present, lead the team conference, and concur with the assessment and plan of the team.   Tennis Must 09/11/2020, 3:32 PM

## 2020-09-11 NOTE — Progress Notes (Signed)
PROGRESS NOTE   Subjective/Complaints: Very agitated this morning. Discussed with [name]- may be due to wet diaper Cr 1.18, trending downward Low albumin  ROS: Limited due to cognitive/behavioral   Objective:   No results found. Recent Labs    09/10/20 0454  WBC 4.5  HGB 12.1  HCT 36.4  PLT 442*   Recent Labs    09/10/20 0454  NA 138  K 3.9  CL 108  CO2 23  GLUCOSE 100*  BUN 18  CREATININE 1.18*  CALCIUM 9.2    Intake/Output Summary (Last 24 hours) at 09/11/2020 1158 Last data filed at 09/11/2020 0800 Gross per 24 hour  Intake 240 ml  Output --  Net 240 ml        Physical Exam: Vital Signs Blood pressure (!) 177/88, pulse 83, temperature 97.9 F (36.6 C), temperature source Axillary, resp. rate 19, height 5\' 7"  (1.702 m), weight 54 kg, SpO2 98 %.  Gen: no distress, normal appearing HEENT: oral mucosa pink and moist, NCAT Cardio: Reg rate Chest: normal effort, normal rate of breathing Abd: soft, non-distended Ext: no edema Psych: agitated this morning.   Neuro:  moves all 4's, confused  Assessment/Plan: 1. Functional deficits which require 3+ hours per day of interdisciplinary therapy in a comprehensive inpatient rehab setting.  Physiatrist is providing close team supervision and 24 hour management of active medical problems listed below.  Physiatrist and rehab team continue to assess barriers to discharge/monitor patient progress toward functional and medical goals  Care Tool:  Bathing  Bathing activity did not occur: Safety/medical concerns     Body parts bathed by helper: Right arm,Left arm,Chest,Abdomen,Front perineal area,Buttocks,Right upper leg,Left upper leg,Left lower leg,Right lower leg,Face     Bathing assist Assist Level: Total Assistance - Patient < 25%     Upper Body Dressing/Undressing Upper body dressing Upper body dressing/undressing activity did not occur (including  orthotics): Safety/medical concerns What is the patient wearing?: Hospital gown only    Upper body assist Assist Level: Total Assistance - Patient < 25%    Lower Body Dressing/Undressing Lower body dressing      What is the patient wearing?: Incontinence brief     Lower body assist Assist for lower body dressing: Dependent - Patient 0%     Toileting Toileting Toileting Activity did not occur (Clothing management and hygiene only): N/A (no void or bm)  Toileting assist Assist for toileting: Dependent - Patient 0% (incontinent)     Transfers Chair/bed transfer  Transfers assist  Chair/bed transfer activity did not occur: Safety/medical concerns  Chair/bed transfer assist level: Total Assistance - Patient < 25%     Locomotion Ambulation   Ambulation assist   Ambulation activity did not occur: Safety/medical concerns  Assist level: Maximal Assistance - Patient 25 - 49%   Max distance: 5 ft   Walk 10 feet activity   Assist  Walk 10 feet activity did not occur: Safety/medical concerns        Walk 50 feet activity   Assist Walk 50 feet with 2 turns activity did not occur: Safety/medical concerns         Walk 150 feet activity   Assist  Walk 150 feet activity did not occur: Safety/medical concerns         Walk 10 feet on uneven surface  activity   Assist Walk 10 feet on uneven surfaces activity did not occur: Safety/medical concerns         Wheelchair     Assist Will patient use wheelchair at discharge?: Yes (pt may benefit from [facility] assessment however unsafe to attempt this AM; per PT long term goals)   Wheelchair activity did not occur: Safety/medical concerns         Wheelchair 50 feet with 2 turns activity    Assist    Wheelchair 50 feet with 2 turns activity did not occur: Safety/medical concerns       Wheelchair 150 feet activity     Assist  Wheelchair 150 feet activity did not occur: Safety/medical  concerns       Blood pressure (!) 177/88, pulse 83, temperature 97.9 F (36.6 C), temperature source Axillary, resp. rate 19, height 5\' 7"  (1.702 m), weight 54 kg, SpO2 98 %.  Medical Problem List and Plan: 1.  Right side weakness with decreased functional mobility secondary to delirium and acute metabolic encephalopathy due to status epilepticus             -patient may shower             -ELOS/Goals: mod A 2-3 weeks+             -Continue CIR therapies including PT, OT, and SLP --team conference today  - Advanced WM changes on MRI 4/12 likely related to SVD. Pt + for cocaine on admit.    -team has engaged husband who wants to bring her home. There are questions about safety in the home however. 2.  Impaired mobility -DVT/anticoagulation: Continue Lovenox             -antiplatelet therapy: Aspirin 325 mg 3. Pain Management: Tylenol as needed 4. Agitation/severely distracted: Antipsychotic: Seroquel 50 mg TID ordered as agitation impacted ability to participate in therapy.  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 Not indicated for this type of psychosis   -add low dose zyprexa at bedtime to start.    -reduce seroquel to 25mg  bid 5. Neuropsych: This patient is capable of making decisions on her own behalf. 6. Skin/Wound Care: Routine skin checks 7. Fluids/Electrolytes/Nutrition:    --needs full supervision with feeding   -protein supps for low albumin   -continue supplement potassium  4/25: potassium normalized.  8.  Seizure disorder.  Vimpat 200 mg twice daily, valproic acid 1000 mg twice daily.  EEG negative 9.  Hypertension.  Norvasc 10 mg daily     4/26 improved bp for the most part. continue lopressor  25mg  bid 10.  History of bilateral cerebral convexity infarction frontal region.  CIR 08/19/2019-08/27/2019 11.  Hyperlipidemia.  Continue Lipitor 12.  History of polysubstance abuse.  Urine drug screen positive cocaine.   13. AKI: creatinine normal on 4/14. Some elevation d/t prerenal  14. UTI: UCX never performed.     -f/u urine cx negative 15. Dysphagia:     -4/19 diet downgraded to D1, thins  -continue and advance as clinically appropriate LOS: 11 days A FACE  TO FACE EVALUATION WAS PERFORMED  Ranelle Oyster 09/11/2020, 11:58 AM

## 2020-09-11 NOTE — Progress Notes (Signed)
Patient ID: Cindy [name], female   DOB: Feb 19, 1961, 60 y.o.   MRN: [id number]  [id number]- SW left message for pt husband Cindy [name] [phone]) to provide updates from team conference, and waiting on follow-up.   [name], MSW, LCSWA Office: [phone] Cell: [phone] Fax: [phone]

## 2020-09-11 NOTE — Progress Notes (Addendum)
Physical Therapy Session Note  Patient Details  Name: Cindy [name] MRN: [id number] Date of Birth: 11-Mar-1961  Today's Date: 09/11/2020 PT Individual Time: 0800-0900, 1035-1050, and 1415-1430 PT Individual Time Calculation (min): 60 min, 15 min, and 15 min   Short Term Goals: Week 2:  PT Short Term Goal 1 (Week 2): Patient will perform bed mobility with mod A >50% of the time. PT Short Term Goal 2 (Week 2): Patient will perform bed>chair transfers with max A consitently. PT Short Term Goal 3 (Week 2): Patient will perform standing balance >1 min with mod-max A. PT Short Term Goal 4 (Week 2): Patient will ambulate >25 feet with +2 assist.  Skilled Therapeutic Interventions/Progress Updates:     Session 1: Patient in bed twisting and pulling at the Posey belt upon PT arrival. Patient alert throughout session. Responded to therapist well using calm voice and informing patient of what to do next or what was happening. Patient was hyperverbal and restless at beginning of session. Verbalized in 3-5 word phrases that were intermittently unintelligible.   RN provided patient with medications while patient sat in the chair. Patient with one bout of choking trying to get pills down despite them being crushed in puree. PT applied cushion to B leg rest of TIS w/c for improved sitting tolerance and skin integrity when patient is in the chair while the patient took her medications.  Therapeutic Activity: Bed Mobility: Donned paper scrub pants and non-skid socks with total A bed level. Performed bridging using B lower extremities x3 automatically for PT to bring pants up over her hips. Patient performed supine to sit with mod A and sitting balance with min A EOB. She performed sit to supine with max a +2 due to fatigue at end of session. Provided verbal cues for initiation with direct cues to complete task. Transfers: Patient performed stand pivot bed>TIS w/c with min A. Patient remained standing and  performed weight shifts back and forth before sitting. She performed sit to/from stand x2 with min-mod a +2. Provided verbal cues for initiation and hip/knee/trunk extension.  Gait Training:  Patient ambulated 116 feet and 12 feet using B HHA with mod progressing to max A +2. Ambulated with decreased step length, step height, and gait speed, narrow BOS progressing to scissoring gait, needed facilitation from therapists leg/foot to advance and abduct R leg with fatigue, maintained flexed trunk throughout with mild posterior bias. Provided verbal cues for sequencing and visual target to encourage longer distances, patient even stated, "just a little more" when reaching the target demonstrating engagement and motivation with this activity.  Patient in bed at end of session with breaks locked, bed and posey belt alarm set, B hand mitts donned, lights on with meditation music playing on the computer and all needs within reach.   Session 2: Patient in bed asleep upon PT arrival. Patient aroused to verbal stimulation, intermittently closing her eyes throughout session.  Therapeutic Activity: Bed Mobility: Patient performed rolling R/L with mod A to allow therapist to change her brief and perform peri-care with total A due to bladder incontinence. Performed supine to/from sit with max-total A of 1 person. Provided verbal cues for initiation with reduced engagement in mobility from patient this session due to fatigue.  Neuromuscular Re-ed: Patient performed the following sitting balance activities >8 min: -midline orientation correcting R lean and forward flexion with visual target on the L and manual facilitation for scapular retraction, anterior pelvic tilt and thoracic extension progressed form mod A-CGA  for sitting balance -lateral leans with facilitation and elbow and hip R/L for improved lateral trunk elongation on the L and R upper extremity weight bearing on the R  Patient in bed at end of session  with breaks locked, bed and posey belt alarm set, B hand mitts donned, lights on with meditation music playing on the computer and all needs within reach.   Session 3: Patient restless in the bed pulling at Posey belt with LPN in the room upon PT arrival. Patient alert and pleasant with therapist throughout session. LPN reported that the patient had just finished getting cleaned up from a BM, however, her upper body had not been cleaned and may have been soiled.   Focused session on sitting balance while total a upper body bathing and dressing were performed with +2 assist.  Therapeutic Activity: Bed Mobility: Patient performed supine to/from sit with max A +2. Provided verbal cues for initiation. Patient sat EOB with mod-max a due to fatigue. Attempted to engage patient in bathing dressing, but patient did not respond to therapists attempts at this time. Did follow cues for trunk control, as in previous session intermittently.  Patient in bed at end of session with breaks locked, bed and posey belt alarm set, B hand mitts donned, lights off, blinds down with meditation music playing on the computer and all needs within reach.   Patient missed 15 min of her last 2 sessions due to fatigue/lethargy missing a total of 30 min of skilled PT, RN made aware. Will attempt to make-up missed time as able.    Therapy Documentation Precautions:  Precautions Precautions: Fall Precaution Comments: bilateral mitts and bedrails padded Restrictions Weight Bearing Restrictions: No   Therapy/Group: Individual Therapy  Imanol Bihl L Laureen Frederic PT, DPT  09/11/2020, 4:25 PM

## 2020-09-11 NOTE — Progress Notes (Signed)
Occupational Therapy Session Note  Patient Details  Name: Cindy [name] MRN: [id number] Date of Birth: 1961-04-05  Today's Date: 09/11/2020 OT Individual Time: 3112-1624 OT Individual Time Calculation (min): 20 min  and Today's Date: 09/11/2020 OT Missed Time: 40 Minutes Missed Time Reason: Patient fatigue   Short Term Goals: Week 2:  OT Short Term Goal 1 (Week 2): Pt will self feed 25% of meals wiht MOD HOH A OT Short Term Goal 2 (Week 2): Pt will focus attention to clinician when name is called 25% of trials OT Short Term Goal 3 (Week 2): Pt will complete 1 step of UB dressing wiht MOD HOH A OT Short Term Goal 4 (Week 2): Pt will tolerate OOB activity 1x daily  Skilled Therapeutic Interventions/Progress Updates:    Pt supine, restless and moving around in bed, muttering to herself. Assisted pt up to EOB with total A. She had several lunges forward, attempting to get to w/c so OT performed total A stand pivot to TIS w/c. Pt was becoming increasingly agitated and attempting to get out of her w/c. Assisted pt back to bed and applied mitts and posey belt to safely allow her to rest. Pt did relax and closed her eyes. She was left supine with all needs met. 40 min missed d/t pt fatigue and falling asleep.   Therapy Documentation Precautions:  Precautions Precautions: Fall Precaution Comments: bilateral mitts and bedrails padded Restrictions Weight Bearing Restrictions: No General: General OT Amount of Missed Time: 40 Minutes   Therapy/Group: Individual Therapy  Curtis Sites 09/11/2020, 12:25 PM

## 2020-09-12 ENCOUNTER — Other Ambulatory Visit: Payer: Self-pay

## 2020-09-12 MED ORDER — OLANZAPINE 5 MG PO TBDP
5.0000 mg | ORAL_TABLET | Freq: Every day | ORAL | Status: DC
  Administered 2020-09-12 – 2020-10-26 (×45): 5 mg via ORAL
  Filled 2020-09-12 (×46): qty 1

## 2020-09-12 NOTE — Plan of Care (Signed)
Pt's plan of care adjusted to QD after speaking with care team and discussed with MD in team conference as pt currently unable to tolerate current therapy schedule with OT, PT, and SLP.   

## 2020-09-12 NOTE — Progress Notes (Signed)
Physical Therapy Session Note  Patient Details  Name: Cindy [name] MRN: [id number] Date of Birth: Oct 08, 1960  Today's Date: 09/12/2020 PT Individual Time: 6503-5465 PT Individual Time Calculation (min): 27 min  Short Term Goals: Week 2:  PT Short Term Goal 1 (Week 2): Patient will perform bed mobility with mod A >50% of the time. PT Short Term Goal 2 (Week 2): Patient will perform bed>chair transfers with max A consitently. PT Short Term Goal 3 (Week 2): Patient will perform standing balance >1 min with mod-max A. PT Short Term Goal 4 (Week 2): Patient will ambulate >25 feet with +2 assist.  Skilled Therapeutic Interventions/Progress Updates:     Patient in bed upon PT arrival. Patient awake, but non-verbal throughout session today. She did liven-up when her husband came in, playfully kicking at him with her L foot, otherwise poor engagement throughout session.  Therapeutic Activity: Bed Mobility: Donned pants and non-skid socks with total A bed level, performed rolling with max A +2 due to poor initiation from patient. Patient performed supine to/from sit with max A of 1 person. Provided verbal cues for log roll and use of L arm to push herself up and lower her trunk to lying. Transfers: Patient performed stand pivot bed<>w/c with max A and sit to/from stand with mod-max A +2. Provided verbal cues for initiation, forward weight shift, and hip/knee/trunk extension in standing.  Gait Training:  Patient ambulated 17 feet using B HHA with max A and mod A-total A for R limb advancement. Ambulated with forward trunk flexion, poor initiation for R foot advancement, decreased R foot clearance, and narrow BOS with R foot intermittently crossing midline. Provided verbal cues for sequencing and initiation, facilitated weight shift and R limb advancement. Patient stopped and sat on therapist's thigh do to fatigue/lack of awareness/attention and required total A to wait for a w/c to be brought up behind  her to return to sitting with total A.  Patient in bed with her husband in the room at end of session with breaks locked, bed and posey belt alarm set, and all needs within reach.    Therapy Documentation Precautions:  Precautions Precautions: Fall Precaution Comments: bilateral mitts and bedrails padded Restrictions Weight Bearing Restrictions: No   Therapy/Group: Individual Therapy  [name] L Verdelle Valtierra PT, DPT  09/12/2020, 6:25 PM

## 2020-09-12 NOTE — Progress Notes (Signed)
On arrival to pt's room. Pt is screaming very loudly and speaking incomprehensible words. Pt also is laying on her side very close to bed rail with leg hanging out of bed. Administered PRN for agitation.  2300 Pt is awake, but no longer yelling.

## 2020-09-12 NOTE — Progress Notes (Signed)
SLP Cancellation Note  Patient Details Name: Cindy [name] MRN: [id number] DOB: October 21, 1960   Cancelled treatment:       Patient missed 30 minutes of skilled SLP intervention due to fatigue. Patient remained asleep and was unable to maintain arousal to participate in treatment. Will re-attempt as able.                                                                                                 Drayke Grabel 09/12/2020, 3:09 PM

## 2020-09-12 NOTE — Progress Notes (Signed)
Pt awake and active all night with intermittent agitation until 0500.

## 2020-09-12 NOTE — Plan of Care (Signed)
  Problem: RH Comprehension Communication Goal: LTG Patient will comprehend basic/complex auditory (SLP) Description: LTG: Patient will comprehend basic/complex auditory information with cues (SLP). Flowsheets (Taken 09/12/2020 0650) LTG: Patient will comprehend: ((1-step commands)) Basic auditory information LTG: Patient will comprehend auditory information with cueing (SLP): Maximal Assistance - Patient 25 - 49%   Problem: RH Expression Communication Goal: LTG Patient will increase speech intelligibility (SLP) Description: LTG: Patient will increase speech intelligibility at word/phrase/conversation level with cues, % of the time (SLP) Flowsheets (Taken 09/12/2020 0650) LTG: Patient will increase speech intelligibility (SLP): Maximal Assistance - Patient 25 - 49% Level: Word Percent of time patient will use intelligible speech: 50% Note: Goal Added

## 2020-09-12 NOTE — Progress Notes (Signed)
Occupational Therapy Session Note  Patient Details  Name: Cindy [name] MRN: [id number] Date of Birth: 02-24-1961  Today's Date: 09/12/2020 OT Missed Time: 45 Minutes Missed Time Reason: Patient fatigue  Pt received in bed and is anarousable. Per RN pt did not fall asleep untl 5am and pt is not alert enough to attempt self feeding. tp missed 45 min d/t fatigue. Will follow up per POC.    Elenore Paddy [name] 09/12/2020, 6:46 AM

## 2020-09-12 NOTE — Progress Notes (Signed)
PROGRESS NOTE   Subjective/Complaints: Pt awake and agitated most of night. Now difficult to arouse  ROS: Limited due to cognitive/behavioral    Objective:   No results found. Recent Labs    09/10/20 0454  WBC 4.5  HGB 12.1  HCT 36.4  PLT 442*   Recent Labs    09/10/20 0454  NA 138  K 3.9  CL 108  CO2 23  GLUCOSE 100*  BUN 18  CREATININE 1.18*  CALCIUM 9.2    Intake/Output Summary (Last 24 hours) at 09/12/2020 0826 Last data filed at 09/11/2020 1700 Gross per 24 hour  Intake 120 ml  Output --  Net 120 ml        Physical Exam: Vital Signs Blood pressure 128/85, pulse 70, temperature 97.6 F (36.4 C), temperature source Oral, resp. rate 16, height 5\' 7"  (1.702 m), weight 55.5 kg, SpO2 100 %.  Constitutional: No distress . Vital signs reviewed. HEENT: EOMI, oral membranes moist Neck: supple Cardiovascular: RRR without murmur. No JVD    Respiratory/Chest: CTA Bilaterally without wheezes or rales. Normal effort    GI/Abdomen: BS +, non-tender, non-distended Ext: no clubbing, cyanosis, or edema Psych: somnolent  Neuro:  moves all 4's, confused  Assessment/Plan: 1. Functional deficits which require 3+ hours per day of interdisciplinary therapy in a comprehensive inpatient rehab setting.  Physiatrist is providing close team supervision and 24 hour management of active medical problems listed below.  Physiatrist and rehab team continue to assess barriers to discharge/monitor patient progress toward functional and medical goals  Care Tool:  Bathing  Bathing activity did not occur: Safety/medical concerns     Body parts bathed by helper: Right arm,Left arm,Chest,Abdomen,Front perineal area,Buttocks,Right upper leg,Left upper leg,Left lower leg,Right lower leg,Face     Bathing assist Assist Level: Total Assistance - Patient < 25%     Upper Body Dressing/Undressing Upper body dressing Upper body  dressing/undressing activity did not occur (including orthotics): Safety/medical concerns What is the patient wearing?: Hospital gown only    Upper body assist Assist Level: Total Assistance - Patient < 25%    Lower Body Dressing/Undressing Lower body dressing      What is the patient wearing?: Incontinence brief     Lower body assist Assist for lower body dressing: Dependent - Patient 0%     Toileting Toileting Toileting Activity did not occur (Clothing management and hygiene only): N/A (no void or bm)  Toileting assist Assist for toileting: Dependent - Patient 0% (incontinent)     Transfers Chair/bed transfer  Transfers assist  Chair/bed transfer activity did not occur: Safety/medical concerns  Chair/bed transfer assist level: Minimal Assistance - Patient > 75%     Locomotion Ambulation   Ambulation assist   Ambulation activity did not occur: Safety/medical concerns  Assist level: 2 helpers Assistive device: Hand held assist Max distance: 116 ft   Walk 10 feet activity   Assist  Walk 10 feet activity did not occur: Safety/medical concerns  Assist level: 2 helpers Assistive device: Hand held assist   Walk 50 feet activity   Assist Walk 50 feet with 2 turns activity did not occur: Safety/medical concerns  Assist level: 2 helpers  Assistive device: Hand held assist    Walk 150 feet activity   Assist Walk 150 feet activity did not occur: Safety/medical concerns         Walk 10 feet on uneven surface  activity   Assist Walk 10 feet on uneven surfaces activity did not occur: Safety/medical concerns         Wheelchair     Assist Will patient use wheelchair at discharge?: Yes (pt may benefit from [facility] assessment however unsafe to attempt this AM; per PT long term goals)   Wheelchair activity did not occur: Safety/medical concerns         Wheelchair 50 feet with 2 turns activity    Assist    Wheelchair 50 feet with 2 turns  activity did not occur: Safety/medical concerns       Wheelchair 150 feet activity     Assist  Wheelchair 150 feet activity did not occur: Safety/medical concerns       Blood pressure 128/85, pulse 70, temperature 97.6 F (36.4 C), temperature source Oral, resp. rate 16, height 5\' 7"  (1.702 m), weight 55.5 kg, SpO2 100 %.  Medical Problem List and Plan: 1.  Right side weakness with decreased functional mobility secondary to delirium and acute metabolic encephalopathy due to status epilepticus             -patient may shower             -ELOS/Goals: mod A 2-3 weeks+             -Continue CIR therapies including PT, OT, and SLP --team conference today  - Advanced WM changes on MRI 4/12 likely related to SVD. Pt + for cocaine on admit.    - husband who wants to bring her home. There are questions about safety in the home however. 2.  Impaired mobility -DVT/anticoagulation: Continue Lovenox             -antiplatelet therapy: Aspirin 325 mg 3. Pain Management: Tylenol as needed 4. Agitation/severely distracted: Antipsychotic: Seroquel 50 mg TID ordered as agitation impacted ability to participate in therapy.  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    -reduce seroquel to 25mg  bid  4/27 up most of last night---increase zyprexa to 5mg  at HS 5. Neuropsych: This patient is capable of making decisions on her own behalf. 6. Skin/Wound Care: Routine skin checks 7. Fluids/Electrolytes/Nutrition:    --needs full supervision with feeding   -protein supps for low albumin   -continue supplement potassium  4/25: potassium normalized.   4/27 po intake sporadic, based on behavior/arousal.     -check bmet and prealbumin tomorrow   -weights holding currently Filed Weights   09/10/20 0340 09/11/20 0524 09/12/20 0500  Weight: 55.4 kg 54 kg 55.5 kg    8.  Seizure disorder.  Vimpat 200 mg twice daily, valproic acid 1000 mg twice daily.  EEG negative 9.  Hypertension.  Norvasc 10 mg daily     4/27  continue lopressor 25mg  bid 10.  History of bilateral cerebral convexity infarction frontal region.  CIR 08/19/2019-08/27/2019 11.  Hyperlipidemia.  Continue Lipitor 12.  History of polysubstance abuse.  Urine drug screen positive cocaine.  13. AKI: creatinine normal on 4/14. Some elevation d/t prerenal  14. UTI: UCX never performed.     -f/u urine cx negative 15. Dysphagia:     -4/19 diet downgraded to D1, thins  -continue and advance as clinically appropriate LOS: 12 days A FACE TO FACE EVALUATION WAS PERFORMED  Ranelle Oyster 09/12/2020, 8:26 AM

## 2020-09-12 NOTE — NC FL2 (Signed)
[place] MEDICAID FL2 LEVEL OF CARE SCREENING TOOL     IDENTIFICATION  Patient Name: Cindy [name] Birthdate: Jun 13, 1960 Sex: female Admission Date (Current Location): 08/31/2020  [place] and [place] Number:  Aaron Edelman [id number] [facility] Facility and Address:  [facility], [address], [place], [place] [address]      Provider Number: [id number]  Attending Physician Name and Address:  Ranelle Oyster, MD  Relative Name and Phone Number:  Tracee Mccreery (husband) [phone]    Current Level of Care: Hospital Recommended Level of Care: Skilled Nursing Facility Prior Approval Number:    Date Approved/Denied:   PASRR Number: [id number]  Discharge Plan: SNF    Current Diagnoses: Patient Active Problem List   Diagnosis Date Noted  . Malnutrition of moderate degree 08/24/2020  . Acute metabolic encephalopathy 08/23/2020  . Polysubstance abuse (HCC) 08/23/2020  . AKI (acute kidney injury) (HCC) 08/23/2020  . History of stroke 08/23/2020  . Seizures (HCC) 02/09/2020  . Hyponatremia 02/09/2020  . SIRS (systemic inflammatory response syndrome) (HCC) 02/09/2020  . Cocaine abuse (HCC) 02/09/2020  . 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 Cerebral embolism with cerebral infarction 08/13/2019  . Status epilepticus (HCC) 08/05/2019  . Acute encephalopathy 08/05/2019  . Elevated AST (SGOT) 10/23/2009  . HYPERCHOLESTEROLEMIA 07/03/2008  . ALCOHOL USE 07/03/2008  . Essential hypertension 07/03/2008  . STROKE 07/03/2008  . DYSPHAGIA UNSPECIFIED 07/03/2008  . Glucose intolerance 07/03/2008  . Pain in joint, shoulder region 06/22/2008  . SPONDYLOSIS 06/22/2008  . CERVICALGIA 06/22/2008  . SPINAL STENOSIS 06/22/2008  . CERVICAL SPASM 06/22/2008    Orientation RESPIRATION BLADDER Height & Weight        Normal Incontinent Weight: 122 lb 5.7 oz (55.5 kg) Height:  5\' 7"  (170.2 cm)  BEHAVIORAL  SYMPTOMS/MOOD NEUROLOGICAL BOWEL NUTRITION STATUS    Convulsions/Seizures (Seizure disorder) Incontinent Diet (D1)  AMBULATORY STATUS COMMUNICATION OF NEEDS Skin   Extensive Assist Non-Verbally Normal (standard wound care orders)                       Personal Care Assistance Level of Assistance  Bathing,Feeding,Dressing Bathing Assistance: Limited assistance Feeding assistance: Limited assistance Dressing Assistance: Limited assistance     Functional Limitations Info  Speech     Speech Info: Impaired    SPECIAL CARE FACTORS FREQUENCY  PT (By licensed PT),OT (By licensed OT),Speech therapy     PT Frequency: 5xs per week OT Frequency: 5xs per week     Speech Therapy Frequency: 5xs per week      Contractures Contractures Info: Not present    Additional Factors Info  Code Status Code Status Info: Full             Current Medications (09/12/2020):  This is the current hospital active medication list Current Facility-Administered Medications  Medication Dose Route Frequency Provider Last Rate Last Admin  . acetaminophen (TYLENOL) tablet 650 mg  650 mg Oral Q6H PRN 09/14/2020, PA-C   650 mg at 09/07/20 2006  . amLODipine (NORVASC) tablet 10 mg  10 mg Oral Daily Raulkar, 2007, MD   10 mg at 09/12/20 0820  . aspirin tablet 325 mg  325 mg Oral Daily 09/14/20, PA-C   325 mg at 09/12/20 0820  . atorvastatin (LIPITOR) tablet 40 mg  40 mg Oral QHS 09/14/20, PA-C   40 mg at 09/11/20 2018  . divalproex (DEPAKOTE  SPRINKLE) capsule 750 mg  750 mg Oral Q8H Ranelle Oyster, MD   750 mg at 09/12/20 1412  . enoxaparin (LOVENOX) injection 40 mg  40 mg Subcutaneous Q24H AngiulliMcarthur Rossetti, PA-C   40 mg at 09/11/20 1706  . feeding supplement (ENSURE ENLIVE / ENSURE PLUS) liquid 237 mL  237 mL Oral TID with meals Charlton Amor, PA-C   237 mL at 09/12/20 0827  . folic acid (FOLVITE) tablet 1 mg  1 mg Oral Daily Charlton Amor, PA-C   1 mg at  09/12/20 0820  . haloperidol (HALDOL) tablet 1 mg  1 mg Oral BID PRN Ranelle Oyster, MD       Or  . haloperidol lactate (HALDOL) injection 1 mg  1 mg Intramuscular BID PRN Ranelle Oyster, MD   1 mg at 09/07/20 1801  . lacosamide (VIMPAT) tablet 200 mg  200 mg Oral BID Charlton Amor, PA-C   200 mg at 09/12/20 0827  . metoprolol tartrate (LOPRESSOR) tablet 25 mg  25 mg Oral BID Ranelle Oyster, MD   25 mg at 09/12/20 0820  . multivitamin with minerals tablet 1 tablet  1 tablet Oral Daily Charlton Amor, PA-C   1 tablet at 09/12/20 0820  . OLANZapine zydis (ZYPREXA) disintegrating tablet 5 mg  5 mg Oral QHS Ranelle Oyster, MD      . pantoprazole (PROTONIX) EC tablet 40 mg  40 mg Oral Daily Charlton Amor, PA-C   40 mg at 09/12/20 0820  . potassium chloride SA (KLOR-CON) CR tablet 20 mEq  20 mEq Oral BID Ranelle Oyster, MD   20 mEq at 09/12/20 0820  . QUEtiapine (SEROQUEL) tablet 25 mg  25 mg Oral BID Ranelle Oyster, MD   25 mg at 09/12/20 1412  . sorbitol 70 % solution 60 mL  60 mL Oral Daily PRN Charlton Amor, PA-C   60 mL at 09/11/20 7408  . thiamine tablet 100 mg  100 mg Oral Daily Charlton Amor, PA-C   100 mg at 09/12/20 1448     Discharge Medications: Please see discharge summary for a list of discharge medications.  Relevant Imaging Results:  Relevant Lab Results:   Additional Information [id number]  Gretchen Short, LCSW

## 2020-09-12 NOTE — Progress Notes (Signed)
Patient ID: Cindy [name], female   DOB: 01-Dec-1960, 59 y.o.   MRN: [id number]  SW met with pt husband Cindy [name] to discuss the amount of care his wife requires when she discharges, and asked him if he is able to provide this level of care. He reported he was unsure. SW encouraged him to come in for family education. Fam education scheduled for Monday (5/2) 1pm-3pm.  Existing NCPASRR#: [id number] [place], MSW, [place] Office: [phone] Cell: [phone] Fax: [phone]

## 2020-09-12 NOTE — Plan of Care (Signed)
  Problem: RH Cognition - SLP Goal: RH LTG Patient will demonstrate orientation with cues Description:  LTG:  Patient will demonstrate orientation to person/place/time/situation with cues (SLP)   Outcome: Not Applicable Note: Goal discontinued due to lack of progress   Problem: RH Problem Solving Goal: LTG Patient will demonstrate problem solving for (SLP) Description: LTG:  Patient will demonstrate problem solving for basic/complex daily situations with cues  (SLP) Outcome: Not Applicable Note: Goal discontinued due to lack of progress   Problem: RH Swallowing Goal: LTG Pt will demonstrate functional change in swallow as evidenced by bedside/clinical objective assessment (SLP) Description: LTG: Patient will demonstrate functional change in swallow as evidenced by bedside/clinical objective assessment (SLP) Outcome: Not Applicable Note: Goal discontinued due to lack of progress   Problem: RH Expression Communication Goal: LTG Patient will verbally express basic/complex needs(SLP) Description: LTG:  Patient will verbally express basic/complex needs, wants or ideas with cues  (SLP) Flowsheets (Taken 09/12/2020 0647) LTG: Patient will verbally express basic/complex needs, wants or ideas (SLP): Maximal Assistance - Patient 25 - 49% Note: Goal downgraded due to lack of progress    Problem: RH Swallowing Goal: LTG Patient will consume least restrictive diet using compensatory strategies with assistance (SLP) Description: LTG:  Patient will consume least restrictive diet using compensatory strategies with assistance (SLP) Flowsheets (Taken 09/12/2020 0647) LTG: Pt Patient will consume least restrictive diet using compensatory strategies with assistance of (SLP): Maximal Assistance - Patient 25 - 49% Note: Goal downgraded due to lack of progress Goal: LTG Patient will participate in dysphagia therapy to increase swallow function with assistance (SLP) Description: LTG:  Patient will participate  in dysphagia therapy to increase swallow function with assistance (SLP) Flowsheets (Taken 09/12/2020 0647) LTG: Pt will participate in dysphagia therapy to increase swallow function with assistance of (SLP): Maximal Assistance - Patient 25 - 49% Note: Goal downgraded due to lack of progress

## 2020-09-13 LAB — COMPREHENSIVE METABOLIC PANEL
ALT: 20 U/L (ref 0–44)
AST: 29 U/L (ref 15–41)
Albumin: 3.4 g/dL — ABNORMAL LOW (ref 3.5–5.0)
Alkaline Phosphatase: 71 U/L (ref 38–126)
Anion gap: 9 (ref 5–15)
BUN: 34 mg/dL — ABNORMAL HIGH (ref 8–23)
CO2: 26 mmol/L (ref 22–32)
Calcium: 9.8 mg/dL (ref 8.9–10.3)
Chloride: 109 mmol/L (ref 98–111)
Creatinine, Ser: 1.5 mg/dL — ABNORMAL HIGH (ref 0.44–1.00)
GFR, Estimated: 39 mL/min — ABNORMAL LOW (ref >60)
Glucose, Bld: 115 mg/dL — ABNORMAL HIGH (ref 70–99)
Potassium: 4.8 mmol/L (ref 3.5–5.1)
Sodium: 144 mmol/L (ref 135–145)
Total Bilirubin: 0.3 mg/dL (ref 0.3–1.2)
Total Protein: 7.7 g/dL (ref 6.5–8.1)

## 2020-09-13 LAB — PREALBUMIN: Prealbumin: 26.3 mg/dL (ref 18–38)

## 2020-09-13 MED ORDER — OLANZAPINE 2.5 MG PO TABS
2.5000 mg | ORAL_TABLET | Freq: Every day | ORAL | Status: DC
  Administered 2020-09-13 – 2020-10-27 (×45): 2.5 mg via ORAL
  Filled 2020-09-13 (×47): qty 1

## 2020-09-13 MED ORDER — SODIUM CHLORIDE 0.45 % IV SOLN: INTRAVENOUS | Status: DC

## 2020-09-13 NOTE — Progress Notes (Signed)
Speech Language Pathology Daily Session Note  Patient Details  Name: Cindy [name] MRN: [id number] Date of Birth: [birth year]  Today's Date: 09/13/2020 SLP Individual Time: 0730-0800 SLP Individual Time Calculation (min): 30 min  Short Term Goals: Week 3: SLP Short Term Goal 1 (Week 3): Patient will focus attention to verbal or tactile stimuli in 25% of opportunities with Max A multimodal cues. SLP Short Term Goal 2 (Week 3): Patient will respond to basic yes/no questions in 25% of opportunities with Max A multimodal cues. SLP Short Term Goal 3 (Week 3): Patient will follow 1-step commands in 25% of opportunities with Max A multimodal cues. SLP Short Term Goal 4 (Week 3): Patient will be ~50% intelligibile at the word level with Max A multimodal cues. SLP Short Term Goal 5 (Week 3): Patient will consume current diet of Dys. 1 textures with thin liquids with minimal overt s/s of aspiration and Max A multimodal cues for use of swallowing compensatory strategies.  Skilled Therapeutic Interventions: Skilled SLP intervention focused on cognition and dysphagia. Pt very difficult to arouse at beginning of session but increased alertness once sitting upright. She kept eyes closed entire session. Pt responded to 1/8 simple yes/no questions. Attempted commands in context during pureed snack trials however pt did not follow any commands. She tolerated pureed snack and thin liquids via straw  with no overt s/sx of aspiration or penetration. She continues to require total A for feeding. Cont with therapy per plan of care.       Pain Pain Assessment Pain Scale: Faces Faces Pain Scale: No hurt  Therapy/Group: Individual Therapy  Cindy [name] 09/13/2020, 7:57 AM

## 2020-09-13 NOTE — Progress Notes (Signed)
Pt slept from 0000-0500.

## 2020-09-13 NOTE — Progress Notes (Signed)
Physical Therapy Note  Patient Details  Name: Cindy [name] MRN: [id number] Date of Birth: 1961/05/18 Today's Date: 09/13/2020    Patient asleep in the bed upon PT arrival. Unable to arouse patient at this time. Per RN patient was restless/agitated before lunch and fell asleep and has been resting since lunch. Patient missed 30 min of skilled PT due to fatigue/lethargy, RN made aware. Will attempt to make-up missed time as able.     Enes Rokosz L Valia Wingard PT, DPT  09/13/2020, 1:13 PM

## 2020-09-13 NOTE — Progress Notes (Signed)
PROGRESS NOTE   Subjective/Complaints: Pt in bed restless. Slept a little better ([phone])  ROS: Limited due to cognitive/behavioral     Objective:   No results found. No results for input(s): WBC, HGB, HCT, PLT in the last 72 hours. Recent Labs    09/13/20 0508  NA 144  K 4.8  CL 109  CO2 26  GLUCOSE 115*  BUN 34*  CREATININE 1.50*  CALCIUM 9.8    Intake/Output Summary (Last 24 hours) at 09/13/2020 1009 Last data filed at 09/13/2020 0522 Gross per 24 hour  Intake 270 ml  Output --  Net 270 ml        Physical Exam: Vital Signs Blood pressure (!) 163/91, pulse 77, temperature 97.8 F (36.6 C), resp. rate 20, height 5\' 7"  (1.702 m), weight 56.9 kg, SpO2 97 %.  Constitutional: No distress . Vital signs reviewed. HEENT: EOMI, oral membranes moist Neck: supple Cardiovascular: RRR without murmur. No JVD    Respiratory/Chest: CTA Bilaterally without wheezes or rales. Normal effort    GI/Abdomen: BS +, non-tender, non-distended Ext: no clubbing, cyanosis, or edema Psych: restless and distracted.  Neuro:  moves all 4's, confused  Assessment/Plan: 1. Functional deficits which require 3+ hours per day of interdisciplinary therapy in a comprehensive inpatient rehab setting.  Physiatrist is providing close team supervision and 24 hour management of active medical problems listed below.  Physiatrist and rehab team continue to assess barriers to discharge/monitor patient progress toward functional and medical goals  Care Tool:  Bathing  Bathing activity did not occur: Safety/medical concerns     Body parts bathed by helper: Right arm,Left arm,Chest,Abdomen,Front perineal area,Buttocks,Right upper leg,Left upper leg,Left lower leg,Right lower leg,Face     Bathing assist Assist Level: Total Assistance - Patient < 25%     Upper Body Dressing/Undressing Upper body dressing Upper body dressing/undressing  activity did not occur (including orthotics): Safety/medical concerns What is the patient wearing?: Hospital gown only    Upper body assist Assist Level: Total Assistance - Patient < 25%    Lower Body Dressing/Undressing Lower body dressing      What is the patient wearing?: Incontinence brief     Lower body assist Assist for lower body dressing: Dependent - Patient 0%     Toileting Toileting Toileting Activity did not occur (Clothing management and hygiene only): N/A (no void or bm)  Toileting assist Assist for toileting: Dependent - Patient 0% (incontinent)     Transfers Chair/bed transfer  Transfers assist  Chair/bed transfer activity did not occur: Safety/medical concerns  Chair/bed transfer assist level: Minimal Assistance - Patient > 75%     Locomotion Ambulation   Ambulation assist   Ambulation activity did not occur: Safety/medical concerns  Assist level: 2 helpers Assistive device: Hand held assist Max distance: 116 ft   Walk 10 feet activity   Assist  Walk 10 feet activity did not occur: Safety/medical concerns  Assist level: 2 helpers Assistive device: Hand held assist   Walk 50 feet activity   Assist Walk 50 feet with 2 turns activity did not occur: Safety/medical concerns  Assist level: 2 helpers Assistive device: Hand held assist    Walk  150 feet activity   Assist Walk 150 feet activity did not occur: Safety/medical concerns         Walk 10 feet on uneven surface  activity   Assist Walk 10 feet on uneven surfaces activity did not occur: Safety/medical concerns         Wheelchair     Assist Will patient use wheelchair at discharge?: Yes (pt may benefit from [facility] assessment however unsafe to attempt this AM; per PT long term goals)   Wheelchair activity did not occur: Safety/medical concerns         Wheelchair 50 feet with 2 turns activity    Assist    Wheelchair 50 feet with 2 turns activity did not occur:  Safety/medical concerns       Wheelchair 150 feet activity     Assist  Wheelchair 150 feet activity did not occur: Safety/medical concerns       Blood pressure (!) 163/91, pulse 77, temperature 97.8 F (36.6 C), resp. rate 20, height 5\' 7"  (1.702 m), weight 56.9 kg, SpO2 97 %.  Medical Problem List and Plan: 1.  Right side weakness with decreased functional mobility secondary to delirium and acute metabolic encephalopathy due to status epilepticus             -patient may shower             -ELOS/Goals: mod A 2-3 weeks+             -Continue CIR therapies including PT, OT, and SLP --team conference today  - Advanced WM changes on MRI 4/12 likely related to SVD. Pt + for cocaine on admit.    -husband encouraged to come in for education (Monday) may need SNF 2.  Impaired mobility -DVT/anticoagulation: Continue Lovenox             -antiplatelet therapy: Aspirin 325 mg 3. Pain Management: Tylenol as needed 4. Agitation/severely distracted: Antipsychotic: Seroquel 50 mg TID ordered as agitation impacted ability to participate in therapy.  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 Neuropsych: This patient is capable of making decisions on her own behalf. 6. Skin/Wound Care: Routine skin checks 7. Fluids/Electrolytes/Nutrition:    --needs full supervision with feeding   -protein supps for low albumin   -continue supplement potassium  4/25: potassium normalized.   4/28 BUN/Cr elevated today d/t inadequate intake of fluids   -weights trending up though, eating better   -prealbumin 26.3! Albumin  increased   -encourage fluids   -will try 1/2ns IVF at [facility] Filed Weights   09/11/20 0524 09/12/20 0500 09/13/20 0449  Weight: 54 kg 55.5 kg 56.9 kg    8.  Seizure disorder.  Vimpat 200 mg twice daily, valproic acid 1000 mg twice daily.  EEG negative 9.  Hypertension.  Norvasc 10 mg daily     4/28 borderline control.  continue lopressor 25mg  bid 10.  History of bilateral cerebral convexity infarction frontal region.  CIR 08/19/2019-08/27/2019 11.  Hyperlipidemia.  Continue Lipitor 12.  History of polysubstance abuse.  Urine drug screen positive cocaine.   13. AKI: creatinine  normal on 4/14. Some elevation d/t prerenal  14. UTI: UCX never performed.     -f/u urine cx negative 15. Dysphagia:     -4/19 diet downgraded to D1, thins  -continue and advance as clinically appropriate LOS: 13 days A FACE TO FACE EVALUATION WAS PERFORMED  Ranelle Oyster 09/13/2020, 10:09 AM

## 2020-09-13 NOTE — Progress Notes (Signed)
Physical Therapy Session Note  Patient Details  Name: Cindy [name] MRN: [id number] Date of Birth: [birth year]  Today's Date: 09/13/2020 PT Individual Time: 1420-1445 PT Individual Time Calculation (min): 25 min   Short Term Goals: Week 2:  PT Short Term Goal 1 (Week 2): Patient will perform bed mobility with mod A >50% of the time. PT Short Term Goal 2 (Week 2): Patient will perform bed>chair transfers with max A consitently. PT Short Term Goal 3 (Week 2): Patient will perform standing balance >1 min with mod-max A. PT Short Term Goal 4 (Week 2): Patient will ambulate >25 feet with +2 assist.  Skilled Therapeutic Interventions/Progress Updates:     Patient in bed with her husband at bedside upon PT arrival. Patient restless and demonstrating poor frustration tolerance and perseveration with communication throughout session. RN arrived to provide patient with medications. Adjusted patient in the bed for improved posture in chair position to take medications. Discussed d/c planning and scheduled family education with patient's husband. He provided conflicting reports of physical disability and stating he could provided max-total A for physical assist at home. Reports he will be present for family education on Monday to perform hands on training with therapy. Declined hands on training today and left during session, stating he can only stay in the hospital for short periods due to discomfort being in the hospital based on previous personal experiences.   Attempted to have patient perform self-feeding of remaining lunch tray in chair position. However, patient not responsive to cues and pulling away from hand-over-hand assist. Began yelling an unintelligible phrase repetitively and crying when therapist continued to not be able to understand her. Comforted patient and provided orientation and education on deficits in calm voice, patient relaxed and closing her eyes after. Returned bed to lying position  and scooted patient up in the bed with total A.   Patient in bed with her eyes closed, B mitts donned, lights off and meditation music play for low stim environment at end of session with breaks locked, bed and Posey belt alarm set, and all needs within reach.    Therapy Documentation Precautions:  Precautions Precautions: Fall Precaution Comments: bilateral mitts and bedrails padded Restrictions Weight Bearing Restrictions: No  Therapy/Group: Individual Therapy  Markeeta Scalf L Conlan Miceli PT, DPT  09/13/2020, 6:05 PM

## 2020-09-13 NOTE — Progress Notes (Signed)
Occupational Therapy Session Note  Patient Details  Name: Cindy [name] MRN: [id number] Date of Birth: 13-May-1961  Today's Date: 09/13/2020 OT Individual Time: 9518-8416 OT Individual Time Calculation (min): 10 min    Short Term Goals: Week 2:  OT Short Term Goal 1 (Week 2): Pt will self feed 25% of meals wiht MOD HOH A OT Short Term Goal 2 (Week 2): Pt will focus attention to clinician when name is called 25% of trials OT Short Term Goal 3 (Week 2): Pt will complete 1 step of UB dressing wiht MOD HOH A OT Short Term Goal 4 (Week 2): Pt will tolerate OOB activity 1x daily  Skilled Therapeutic Interventions/Progress Updates:    1:1. Pt received in bed with a wet brief and old shirt on. Pt able to intiate partial bridge of hips to revmove brief likely d/t trying to get away from OT/roll. Pt with no agitation during peri care with OT explaining what was going to happen. Pt requires total A overall to change shirt in bed with no agitation. Exited session with pt seated in bed, exit alarm on and call light in reach   Therapy Documentation Precautions:  Precautions Precautions: Fall Precaution Comments: bilateral mitts and bedrails padded Restrictions Weight Bearing Restrictions: No General:   Vital Signs: Therapy Vitals Temp: 97.8 F (36.6 C) Pulse Rate: 77 Resp: 20 BP: (!) 163/91 Patient Position (if appropriate): Lying Oxygen Therapy SpO2: 97 % O2 Device: Room Air Pain:   ADL: ADL Eating: Unable to assess Grooming: Unable to assess Upper Body Bathing: Unable to assess Lower Body Bathing: Unable to assess Upper Body Dressing: Unable to assess Lower Body Dressing: Dependent Where Assessed-Lower Body Dressing: Bed level Toileting: Unable to assess Toilet Transfer: Unable to assess Tub/Shower Transfer: Unable to assess Psychologist, counselling Transfer: Unable to assess Vision   Perception    Praxis   Exercises:   Other Treatments:     Therapy/Group: Individual  Therapy  Shon Hale 09/13/2020, 6:48 AM

## 2020-09-13 NOTE — Progress Notes (Signed)
Speech Language Pathology Weekly Progress Note  Patient Details  Name: Cindy [name] MRN: [id number] Date of Birth: 1960/11/26  Beginning of progress report period: September 06, 2020 End of progress report period: September 13, 2020  Short Term Goals: Week 2: SLP Short Term Goal 1 (Week 2): Patient will focus attention to verbal or tactile stimuli in 25% of opportunities with Max A multimodal cues. SLP Short Term Goal 1 - Progress (Week 2): Not met SLP Short Term Goal 2 (Week 2): Patient will respond to basic yes/no questions in 25% of opportunities with Max A multimodal cues. SLP Short Term Goal 2 - Progress (Week 2): Not met SLP Short Term Goal 3 (Week 2): Patient will follow 1-step commands in 25% of opportunities with Max A multimodal cues. SLP Short Term Goal 3 - Progress (Week 2): Not met SLP Short Term Goal 4 (Week 2): Patient will be ~50% intelligibile at the phrase level with Max A multimodal cues. SLP Short Term Goal 4 - Progress (Week 2): Not met SLP Short Term Goal 5 (Week 2): Patient will consume current diet of Dys. 1 textures with thin liquids with minimal overt s/s of aspiration and Max A multimodal cues for use of swallowing compensatory strategies. SLP Short Term Goal 5 - Progress (Week 2): Not met    New Short Term Goals: Week 3: SLP Short Term Goal 1 (Week 3): Patient will focus attention to verbal or tactile stimuli in 25% of opportunities with Max A multimodal cues. SLP Short Term Goal 2 (Week 3): Patient will respond to basic yes/no questions in 25% of opportunities with Max A multimodal cues. SLP Short Term Goal 3 (Week 3): Patient will follow 1-step commands in 25% of opportunities with Max A multimodal cues. SLP Short Term Goal 4 (Week 3): Patient will be ~50% intelligibile at the word level with Max A multimodal cues. SLP Short Term Goal 5 (Week 3): Patient will consume current diet of Dys. 1 textures with thin liquids with minimal overt s/s of aspiration and Max A  multimodal cues for use of swallowing compensatory strategies.  Weekly Progress Updates: Patient continues to make minimal gains and has not met any STGs this reporting period. Currently, patient appears to fluctuate between agitated and lethargic with decreased ability to maintain arousal which makes it extremely difficult for patient to participate in therapy effectively. When patient is alert, she is unable to focus her attention to most tasks and requires total A to follow commands. Patient's speech is essentially unintelligible and patient is confused and often hallucinating. Patient is consuming Dys. 1 textures with thin liquids with minimal overt s/s of aspiration but with limited PO intake due to current cognitive functioning. Suspect patient's husband is unable to provide the level of cognitive and physical assistance needed at this time, therefore, discharge recommendations are changed to SNF.  Family education ongoing. Patient would benefit from continued skilled SLP intervention to maximize her cognitive, swallowing and speech function prior to discharge.      Intensity: Minumum of 1-2 x/day, 30 to 90 minutes Frequency: 3 to 5 out of 7 days Duration/Length of Stay: TBD due to likely SNF placement Treatment/Interventions: Cognitive remediation/compensation;Speech/Language facilitation;Therapeutic Activities;Therapeutic Exercise;Dysphagia/aspiration precaution training;Patient/family education;Environmental controls;Cueing hierarchy;Functional tasks     Cuthbert Turton, [place] 09/13/2020, 6:20 AM

## 2020-09-13 NOTE — Plan of Care (Signed)
  Problem: RH Attention Goal: LTG Patient will demonstrate this level of attention during functional activites (SLP) Description: LTG:  Patient will will demonstrate this level of attention during functional activites (SLP) 09/13/2020 0620 by Huston Foley, CCC-SLP Flowsheets (Taken 09/13/2020 [phone]) Patient will demonstrate during cognitive/linguistic activities the attention type of: Focused Patient will demonstrate this level of attention during cognitive/linguistic activities in: Controlled LTG: Patient will demonstrate this level of attention during cognitive/linguistic activities with assistance of (SLP): Maximal Assistance - Patient 25 - 49% Number of minutes patient will demonstrate attention during cognitive/linguistic activities: 25% of opportunities

## 2020-09-14 LAB — BASIC METABOLIC PANEL
Anion gap: 11 (ref 5–15)
BUN: 36 mg/dL — ABNORMAL HIGH (ref 8–23)
CO2: 23 mmol/L (ref 22–32)
Calcium: 9.5 mg/dL (ref 8.9–10.3)
Chloride: 107 mmol/L (ref 98–111)
Creatinine, Ser: 1.36 mg/dL — ABNORMAL HIGH (ref 0.44–1.00)
GFR, Estimated: 44 mL/min — ABNORMAL LOW (ref >60)
Glucose, Bld: 128 mg/dL — ABNORMAL HIGH (ref 70–99)
Potassium: 4.7 mmol/L (ref 3.5–5.1)
Sodium: 141 mmol/L (ref 135–145)

## 2020-09-14 NOTE — Progress Notes (Signed)
Physical Therapy Session Note  Patient Details  Name: Cindy [name] MRN: [id number] Date of Birth: Jul 19, 1960  Today's Date: 09/14/2020 PT Individual Time: 9892-1194 PT Individual Time Calculation (min): 23 min   Short Term Goals: Week 2:  PT Short Term Goal 1 (Week 2): Patient will perform bed mobility with mod A >50% of the time. PT Short Term Goal 2 (Week 2): Patient will perform bed>chair transfers with max A consitently. PT Short Term Goal 3 (Week 2): Patient will perform standing balance >1 min with mod-max A. PT Short Term Goal 4 (Week 2): Patient will ambulate >25 feet with +2 assist.  Skilled Therapeutic Interventions/Progress Updates:     Pt received supine in bed. No indication of pain. Pt performs supine to sit with totalA. Once seated at EOB pt initially leaning to the R and posteriorly. PT provides repositioning and cues, with pt in L sideleaning on L elbow and pt able to maintain static sitting balance with CGA for 10-15 seconds at a time. Pt tends to drift posteriorly and requires frequent repositioning. Pt sits at EOB ~10 minutes with overall modA required for balance. Pt then attempting to lay back down. Sit to supine with maxA. Pt left supine in bed with posey belt intact and bilateral mitten donned.   Therapy Documentation Precautions:  Precautions Precautions: Fall Precaution Comments: bilateral mitts and bedrails padded Restrictions Weight Bearing Restrictions: No   Therapy/Group: Individual Therapy  Beau Fanny, PT, DPT 09/14/2020, 12:46 PM

## 2020-09-14 NOTE — Progress Notes (Addendum)
PROGRESS NOTE   Subjective/Complaints: Restless somewhat last night. Awake this morning. Doesn't engage much  ROS: Limited due to cognitive/behavioral   Objective:   No results found. No results for input(s): WBC, HGB, HCT, PLT in the last 72 hours. Recent Labs    09/13/20 0508 09/14/20 0619  NA 144 141  K 4.8 4.7  CL 109 107  CO2 26 23  GLUCOSE 115* 128*  BUN 34* 36*  CREATININE 1.50* 1.36*  CALCIUM 9.8 9.5    Intake/Output Summary (Last 24 hours) at 09/14/2020 1151 Last data filed at 09/14/2020 0811 Gross per 24 hour  Intake 698 ml  Output --  Net 698 ml        Physical Exam: Vital Signs Blood pressure 136/88, pulse 90, temperature 99.9 F (37.7 C), temperature source Oral, resp. rate 20, height 5\' 7"  (1.702 m), weight 58.7 kg, SpO2 95 %.  Constitutional: No distress . Vital signs reviewed. HEENT: EOMI, oral membranes moist Neck: supple Cardiovascular: RRR without murmur. No JVD    Respiratory/Chest: CTA Bilaterally without wheezes or rales. Normal effort    GI/Abdomen: BS +, non-tender, non-distended Ext: no clubbing, cyanosis, or edema Psych: distracted, non-agitated, difficult to engage  Neuro:  moves all 4's, confused  Assessment/Plan: 1. Functional deficits which require 3+ hours per day of interdisciplinary therapy in a comprehensive inpatient rehab setting.  Physiatrist is providing close team supervision and 24 hour management of active medical problems listed below.  Physiatrist and rehab team continue to assess barriers to discharge/monitor patient progress toward functional and medical goals  Care Tool:  Bathing  Bathing activity did not occur: Safety/medical concerns     Body parts bathed by helper: Right arm,Left arm,Chest,Abdomen,Front perineal area,Buttocks,Right upper leg,Left upper leg,Left lower leg,Right lower leg,Face     Bathing assist Assist Level: Total Assistance -  Patient < 25%     Upper Body Dressing/Undressing Upper body dressing Upper body dressing/undressing activity did not occur (including orthotics): Safety/medical concerns What is the patient wearing?: Hospital gown only    Upper body assist Assist Level: Total Assistance - Patient < 25%    Lower Body Dressing/Undressing Lower body dressing      What is the patient wearing?: Incontinence brief     Lower body assist Assist for lower body dressing: Dependent - Patient 0%     Toileting Toileting Toileting Activity did not occur (Clothing management and hygiene only): N/A (no void or bm)  Toileting assist Assist for toileting: Dependent - Patient 0% (incontinent)     Transfers Chair/bed transfer  Transfers assist  Chair/bed transfer activity did not occur: Safety/medical concerns  Chair/bed transfer assist level: Minimal Assistance - Patient > 75%     Locomotion Ambulation   Ambulation assist   Ambulation activity did not occur: Safety/medical concerns  Assist level: 2 helpers Assistive device: Hand held assist Max distance: 116 ft   Walk 10 feet activity   Assist  Walk 10 feet activity did not occur: Safety/medical concerns  Assist level: 2 helpers Assistive device: Hand held assist   Walk 50 feet activity   Assist Walk 50 feet with 2 turns activity did not occur: Safety/medical concerns  Assist level: 2 helpers Assistive device: Hand held assist    Walk 150 feet activity   Assist Walk 150 feet activity did not occur: Safety/medical concerns         Walk 10 feet on uneven surface  activity   Assist Walk 10 feet on uneven surfaces activity did not occur: Safety/medical concerns         Wheelchair     Assist Will patient use wheelchair at discharge?: Yes (pt may benefit from [facility] assessment however unsafe to attempt this AM; per PT long term goals)   Wheelchair activity did not occur: Safety/medical concerns         Wheelchair 50  feet with 2 turns activity    Assist    Wheelchair 50 feet with 2 turns activity did not occur: Safety/medical concerns       Wheelchair 150 feet activity     Assist  Wheelchair 150 feet activity did not occur: Safety/medical concerns       Blood pressure 136/88, pulse 90, temperature 99.9 F (37.7 C), temperature source Oral, resp. rate 20, height 5\' 7"  (1.702 m), weight 58.7 kg, SpO2 95 %.  Medical Problem List and Plan: 1.  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             -patient may shower             -ELOS/Goals: mod A 2-3 weeks+             -Continue CIR therapies including PT, OT, and SLP --team conference today  - Advanced WM changes on MRI 4/12 likely related to SVD. Pt + for cocaine on admit.    -husband encouraged to come in for education (Monday). States he'll be here and can manage her. she may need SNF 2.  Impaired mobility -DVT/anticoagulation: Continue Lovenox             -antiplatelet therapy: Aspirin 325 mg 3. Pain Management: Tylenol as needed 4. Agitation/psychosis: Antipsychotic: zyprexa 5mg  hs and 2.5mg  dailoy  - Continue bilateral mittens as needed  -depakote 750mg  tid   -check level Monday  -needs haldol IM on occasion (twice in last week)  -seeing improvement 5. Neuropsych: This patient is capable of making decisions on her own behalf. 6. Skin/Wound Care: Routine skin checks 7. Fluids/Electrolytes/Nutrition:    --needs full supervision with feeding   -protein supps for low albumin   -continue supplement potassium  4/25: potassium normalized.   4/28 BUN/Cr elevated today d/t inadequate intake of fluids   -weights trending up, intake can be inconsistent   -prealbumin 26.3! Albumin increased   -push fluids   -continue 1/2ns IVF at [facility] 75cc/hr Filed Weights   09/12/20 0500 09/13/20 0449 09/14/20 0500  Weight: 55.5 kg 56.9 kg 58.7 kg      -check labs Monday 8.  Seizure disorder.  Vimpat 200 mg twice daily, valproic acid 1000 mg twice daily.  EEG negative 9.  Hypertension.  Norvasc 10 mg daily     4/29 borderline control.  continue lopressor 25mg  bid 10.  History of bilateral cerebral convexity infarction frontal region.  CIR 08/19/2019-08/27/2019 11.  Hyperlipidemia.  Continue Lipitor 12.  History of polysubstance abuse.  Urine drug screen positive cocaine.   13. AKI: creatinine normal on 4/14. Some elevation d/t prerenal  14. UTI: UCX never performed.     -f/u urine cx negative 15. Dysphagia:     -  4/19 diet downgraded to D1, thins  -continue and advance as clinically appropriate LOS: 14 days A FACE TO FACE EVALUATION WAS PERFORMED  Ranelle Oyster 09/14/2020, 11:51 AM

## 2020-09-14 NOTE — Progress Notes (Signed)
Patient ID: Cindy [name], female   DOB: 02-Feb-1961, 60 y.o.   MRN: [id number]  SW returnd phone call to [facility] [phone]) who wanted to confirm pt discharge date and d/c plan. SW informed on being unsure at this time, but family edu is planned for Monday 1pm-3pm. She stated she will go by the home that morning to remind patient's husband to be present for family education. SW will update on d/c plan once available.  [name], MSW, LCSWA Office: [phone] Cell: [phone] Fax: [phone]

## 2020-09-14 NOTE — Plan of Care (Signed)
  Problem: Sit to Stand Goal: LTG:  Patient will perform sit to stand in prep for activites of daily living with assistance level (OT) Description: LTG:  Patient will perform sit to stand in prep for activites of daily living with assistance level (OT) Note: Discontinued goal 4/29 d/t progress/cognitive deficits SMS OTR/L   Problem: RH Eating Goal: LTG Patient will perform eating w/assist, cues/equip (OT) Description: LTG: Patient will perform eating with assist, with/without cues using equipment (OT) Flowsheets (Taken 09/14/2020 1628) LTG: Pt will perform eating with assistance level of: Maximal Assistance - Patient 25 - 49% Note: Downgraded d/t progress SMS OT 4/29   Problem: RH Grooming Goal: LTG Patient will perform grooming w/assist,cues/equip (OT) Description: LTG: Patient will perform grooming with assist, with/without cues using equipment (OT) Flowsheets (Taken 09/14/2020 1628) LTG: Pt will perform grooming with assistance level of: Maximal Assistance - Patient 25 - 49% Note: Downgraded d/t progress d/t progress/cogntive deficits SMS OTR/L   Problem: RH Bathing Goal: LTG Patient will bathe all body parts with assist levels (OT) Description: LTG: Patient will bathe all body parts with assist levels (OT) Outcome: Not Applicable Note: Discontinued goal 4/29 d/t progress/cognitive deficits SMS OTR/L   Problem: RH Dressing Goal: LTG Patient will perform upper body dressing (OT) Description: LTG Patient will perform upper body dressing with assist, with/without cues (OT). Outcome: Not Applicable Note: Discontinued goal 4/29 d/t progress/cognitive deficits SMS OTR/L Goal: LTG Patient will perform lower body dressing w/assist (OT) Description: LTG: Patient will perform lower body dressing with assist, with/without cues in positioning using equipment (OT) Outcome: Not Applicable Note: Discontinued goal 4/29 d/t progress/cognitive deficits SMS OTR/L   Problem: RH Toileting Goal: LTG  Patient will perform toileting task (3/3 steps) with assistance level (OT) Description: LTG: Patient will perform toileting task (3/3 steps) with assistance level (OT)  Outcome: Not Applicable Note: Discontinued goal 4/29 d/t progress/cognitive deficits SMS OTR/L   Problem: RH Toilet Transfers Goal: LTG Patient will perform toilet transfers w/assist (OT) Description: LTG: Patient will perform toilet transfers with assist, with/without cues using equipment (OT) Outcome: Not Applicable Note: Discontinued goal 4/29 d/t progress/cognitive deficits SMS OTR/L   Problem: RH Toilet Transfers Goal: LTG Patient will perform toilet transfers w/assist (OT) Description: LTG: Patient will perform toilet transfers with assist, with/without cues using equipment (OT) Outcome: Not Applicable Note: Discontinued goal 4/29 d/t progress/cognitive deficits SMS OTR/L   Problem: RH Tub/Shower Transfers Goal: LTG Patient will perform tub/shower transfers w/assist (OT) Description: LTG: Patient will perform tub/shower transfers with assist, with/without cues using equipment (OT) Outcome: Not Applicable Note: Discontinued goal 4/29 d/t progress/cognitive deficits SMS OTR/L   Problem: RH Attention Goal: LTG Patient will demonstrate this level of attention during functional activites (OT) Description: LTG:  Patient will demonstrate this level of attention during functional activites  (OT) Flowsheets (Taken 09/14/2020 1628) Patient will demonstrate this level of attention during functional activites: Focused LTG: Patient will demonstrate this level of attention during functional activites (OT): Maximal Assistance - Patient 25 - 49% Note: Downgraded d/t progress/cognition   Problem: RH Awareness Goal: LTG: Patient will demonstrate awareness during functional activites type of (OT) Description: LTG: Patient will demonstrate awareness during functional activites type of (OT) Outcome: Not Applicable Note:  Discontinued goal 4/29 d/t progress/cognitive deficits SMS OTR/L

## 2020-09-14 NOTE — Progress Notes (Signed)
Occupational Therapy Weekly Progress Note  Patient Details  Name: Cindy [name] MRN: [id number] Date of Birth: [birth year]  Beginning of progress report period: September 07, 2020 End of progress report period: September 14, 2020'  Today's Date: 09/14/2020 OT Individual Time: 1315-1330 OT Individual Time Calculation (min): 15 min    Patient has met 1 of 4 short term goals.  Pt has made minimal progress towards OT goals. LTGs have been downgraded/removed d/t progress as pt continues to requires total care. Pt has had some improvement in agitation and tolerates peri care with max A overall. Pt was able to intermittently sit with S-CGA.  Patient continues to demonstrate the following deficits: muscle weakness, decreased cardiorespiratoy endurance, impaired timing and sequencing, abnormal tone, unbalanced muscle activation, motor apraxia, ataxia, decreased coordination and decreased motor planning, decreased visual acuity and decreased visual perceptual skills, decreased attention to left, decreased initiation, decreased attention, decreased awareness, decreased problem solving, decreased safety awareness, decreased memory and delayed processing and decreased sitting balance, decreased standing balance, decreased postural control and decreased balance strategies and therefore will continue to benefit from skilled OT intervention to enhance overall performance with BADL and iADL.  Patient not progressing toward long term goals.  See goal revision..  Plan of care revisions: goals discontinued d/t cognition/progress except attention, sitting balance, eating and grooming.  OT Short Term Goals Week 3:  OT Short Term Goal 1 (Week 3): Pt will self feed wiht MOD HOH A 25% of meal OT Short Term Goal 2 (Week 3): Pt will tolerate OOB actiivty 1x per day OT Short Term Goal 3 (Week 3): Pt will roll withMOD A and no agitation in prep for self care  Skilled Therapeutic Interventions/Progress Updates:    1;1. Pt received  in bed with NT changing her. Pt able to tolerate and follow commands and bend knee with tactile cues and roll with MOD A wihle peri care and rief donning provided. Pt able to follow 1/5 commands to come to sitting on EOB with MAX A but after initial 30 seconds pt able to hold sitting statically for 5 min with S-CGA!Marland Kitchen Pt clearly restless and attempting to motor plan something but unable. Pt beginning to get agitated with unsuccessful attempts and returned to supine with HOB elevated. Pt able to state, "bye bye" to OT. Exited session with pt seated in bed, exit alarm on and call light in reach   Therapy Documentation Precautions:  Precautions Precautions: Fall Precaution Comments: bilateral mitts and bedrails padded Restrictions Weight Bearing Restrictions: No General:   Vital Signs: Therapy Vitals Temp: 99.9 F (37.7 C) Temp Source: Oral Pulse Rate: 90 Resp: 20 BP: 136/88 Patient Position (if appropriate): Lying Oxygen Therapy SpO2: 95 % O2 Device: Room Air Pain:   ADL: ADL Eating: Unable to assess Grooming: Unable to assess Upper Body Bathing: Unable to assess Lower Body Bathing: Unable to assess Upper Body Dressing: Unable to assess Lower Body Dressing: Dependent Where Assessed-Lower Body Dressing: Bed level Toileting: Unable to assess Toilet Transfer: Unable to assess Tub/Shower Transfer: Unable to assess Gaffer Transfer: Unable to assess Vision   Perception    Praxis   Exercises:   Other Treatments:     Therapy/Group: Individual Therapy  Tonny Branch 09/14/2020, 7:01 AM

## 2020-09-15 NOTE — Progress Notes (Signed)
Physical Therapy Session Note  Patient Details  Name: Cindy [name] MRN: [id number] Date of Birth: 08/06/1960  Today's Date: 09/15/2020 PT Missed Time: 30 Minutes Missed Time Reason: Increased agitation;Patient unwilling to participate  Short Term Goals: Week 2:  PT Short Term Goal 1 (Week 2): Patient will perform bed mobility with mod A >50% of the time. PT Short Term Goal 2 (Week 2): Patient will perform bed>chair transfers with max A consitently. PT Short Term Goal 3 (Week 2): Patient will perform standing balance >1 min with mod-max A. PT Short Term Goal 4 (Week 2): Patient will ambulate >25 feet with +2 assist.  Skilled Therapeutic Interventions/Progress Updates:    Patient in bed occasionally opening her eyes. PT introduced herself in soft tones. Patient becoming increasing agitated speaking mostly incoherently, but what PT could make out was "I don't want a shot." PT reassuring patient that she was not here to provide a shot or any medication, gently attempting to reorient patient to purpose of PTs visit. Patient becoming increasingly upset- PT unable to gently redirect patient. Patient remaining in bed, 4 rails up, B mitts, Posey belt, bed alarm on, call light within reach.   Therapy Documentation Precautions:  Precautions Precautions: Fall Precaution Comments: bilateral mitts and bedrails padded Restrictions Weight Bearing Restrictions: No    Therapy/Group: Individual Therapy  Elizebeth Koller, PT, DPT, CBIS  09/15/2020, 7:39 AM

## 2020-09-15 NOTE — Progress Notes (Signed)
PROGRESS NOTE   Subjective/Complaints: No complaints this morning.  Sleepy  ROS: Limited due to cognitive/behavioral   Objective:   No results found. No results for input(s): WBC, HGB, HCT, PLT in the last 72 hours. Recent Labs    09/13/20 0508 09/14/20 0619  NA 144 141  K 4.8 4.7  CL 109 107  CO2 26 23  GLUCOSE 115* 128*  BUN 34* 36*  CREATININE 1.50* 1.36*  CALCIUM 9.8 9.5    Intake/Output Summary (Last 24 hours) at 09/15/2020 1519 Last data filed at 09/15/2020 1308 Gross per 24 hour  Intake 1577.15 ml  Output --  Net 1577.15 ml        Physical Exam: Vital Signs Blood pressure 114/80, pulse 89, temperature 98.1 F (36.7 C), resp. rate 20, height 5\' 7"  (1.702 m), weight 57.9 kg, SpO2 100 %.  Gen: no distress, normal appearing HEENT: oral mucosa pink and moist, NCAT Cardio: Reg rate Chest: normal effort, normal rate of breathing Abd: soft, non-distended Ext: no clubbing, cyanosis, or edema Psych: distracted, non-agitated, difficult to engage  Neuro:  moves all 4's, confused  Assessment/Plan: 1. Functional deficits which require 3+ hours per day of interdisciplinary therapy in a comprehensive inpatient rehab setting.  Physiatrist is providing close team supervision and 24 hour management of active medical problems listed below.  Physiatrist and rehab team continue to assess barriers to discharge/monitor patient progress toward functional and medical goals  Care Tool:  Bathing  Bathing activity did not occur: Safety/medical concerns     Body parts bathed by helper: Right arm,Left arm,Chest,Abdomen,Front perineal area,Buttocks,Right upper leg,Left upper leg,Left lower leg,Right lower leg,Face     Bathing assist Assist Level: Total Assistance - Patient < 25%     Upper Body Dressing/Undressing Upper body dressing Upper body dressing/undressing activity did not occur (including orthotics):  Safety/medical concerns What is the patient wearing?: Hospital gown only    Upper body assist Assist Level: Total Assistance - Patient < 25%    Lower Body Dressing/Undressing Lower body dressing      What is the patient wearing?: Incontinence brief     Lower body assist Assist for lower body dressing: Dependent - Patient 0%     Toileting Toileting Toileting Activity did not occur (Clothing management and hygiene only): N/A (no void or bm)  Toileting assist Assist for toileting: Dependent - Patient 0% (incontinent)     Transfers Chair/bed transfer  Transfers assist  Chair/bed transfer activity did not occur: Safety/medical concerns  Chair/bed transfer assist level: Minimal Assistance - Patient > 75%     Locomotion Ambulation   Ambulation assist   Ambulation activity did not occur: Safety/medical concerns  Assist level: 2 helpers Assistive device: Hand held assist Max distance: 116 ft   Walk 10 feet activity   Assist  Walk 10 feet activity did not occur: Safety/medical concerns  Assist level: 2 helpers Assistive device: Hand held assist   Walk 50 feet activity   Assist Walk 50 feet with 2 turns activity did not occur: Safety/medical concerns  Assist level: 2 helpers Assistive device: Hand held assist    Walk 150 feet activity   Assist Walk 150 feet  activity did not occur: Safety/medical concerns         Walk 10 feet on uneven surface  activity   Assist Walk 10 feet on uneven surfaces activity did not occur: Safety/medical concerns         Wheelchair     Assist Will patient use wheelchair at discharge?: Yes (pt may benefit from [facility] assessment however unsafe to attempt this AM; per PT long term goals)   Wheelchair activity did not occur: Safety/medical concerns         Wheelchair 50 feet with 2 turns activity    Assist    Wheelchair 50 feet with 2 turns activity did not occur: Safety/medical concerns       Wheelchair  150 feet activity     Assist  Wheelchair 150 feet activity did not occur: Safety/medical concerns       Blood pressure 114/80, pulse 89, temperature 98.1 F (36.7 C), resp. rate 20, height 5\' 7"  (1.702 m), weight 57.9 kg, SpO2 100 %.  Medical Problem List and Plan: 1.  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             -patient may shower             -ELOS/Goals: mod A 2-3 weeks+             -Continue CIR therapies including PT, OT, and SLP --team conference today  - Advanced WM changes on MRI 4/12 likely related to SVD. Pt + for cocaine on admit.    -husband encouraged to come in for education (Monday). States he'll be here and can manage her. she may need SNF 2.  Impaired mobility -DVT/anticoagulation: Continue Lovenox             -antiplatelet therapy: Aspirin 325 mg 3. Pain Management: Continue Tylenol as needed 4. Agitation/psychosis: Antipsychotic: Continue zyprexa 5mg  hs and 2.5mg  dailoy  - Continue bilateral mittens as needed  -depakote 750mg  tid   -check level Monday  -needs haldol IM on occasion (twice in last week)  -seeing improvement 5. Neuropsych: This patient is capable of making decisions on her own behalf. 6. Skin/Wound Care: Routine skin checks 7. Fluids/Electrolytes/Nutrition:    --needs full supervision with feeding   -protein supps for low albumin   -continue supplement potassium  4/25: potassium normalized.   4/28 BUN/Cr elevated today d/t inadequate intake of fluids   -4/30: weight dow, continue to monitor   -prealbumin 26.3! Albumin increased   -push fluids   -continue 1/2ns [facility] at [facility] 75cc/hr Filed Weights   09/13/20 0449 09/14/20 0500 09/15/20 0450  Weight: 56.9 kg 58.7 kg 57.9 kg     -check labs Monday 8.  Seizure disorder.  Vimpat 200 mg twice daily, valproic acid 1000 mg twice daily.  EEG negative 9.  Hypertension.  Norvasc 10 mg daily      4/29 borderline control.  continue lopressor 25mg  bid 10.  History of bilateral cerebral convexity infarction frontal region.  CIR 08/19/2019-08/27/2019 11.  Hyperlipidemia.  Continue Lipitor 12.  History of polysubstance abuse.  Urine drug screen positive cocaine.   13. AKI: creatinine normal on 4/14. Some elevation d/t prerenal  14. UTI: UCX never performed.     -f/u urine cx negative 15. Dysphagia:     -4/19 diet downgraded to D1, thins  -continue and advance as clinically appropriate LOS: 15 days A FACE TO FACE EVALUATION WAS PERFORMED  Drema Pry Candace Ramus 09/15/2020, 3:19 PM

## 2020-09-16 MED ORDER — MELATONIN 3 MG PO TABS
3.0000 mg | ORAL_TABLET | Freq: Every day | ORAL | Status: DC
  Administered 2020-09-16 – 2020-10-26 (×41): 3 mg via ORAL
  Filled 2020-09-16 (×41): qty 1

## 2020-09-16 NOTE — Progress Notes (Signed)
PROGRESS NOTE   Subjective/Complaints: Restless overnight Sleeping this morning Melatonin 3mg  ordered to help restore circadian rhythm  ROS:  Limited due to cognitive/behavioral   Objective:   No results found. No results for input(s): WBC, HGB, HCT, PLT in the last 72 hours. Recent Labs    09/14/20 0619  NA 141  K 4.7  CL 107  CO2 23  GLUCOSE 128*  BUN 36*  CREATININE 1.36*  CALCIUM 9.5    Intake/Output Summary (Last 24 hours) at 09/16/2020 1217 Last data filed at 09/16/2020 11/16/2020 Gross per 24 hour  Intake 1070.39 ml  Output --  Net 1070.39 ml        Physical Exam: Vital Signs Blood pressure (!) 142/53, pulse 73, temperature 98 F (36.7 C), resp. rate 18, height 5\' 7"  (1.702 m), weight 55.8 kg, SpO2 100 %.  Gen: no distress, normal appearing HEENT: oral mucosa pink and moist, NCAT Cardio: Reg rate Chest: normal effort, normal rate of breathing Abd: soft, non-distended Ext: no clubbing, cyanosis, or edema Psych: distracted, non-agitated, difficult to engage, somnolent  Neuro:  moves all 4's, confused  Assessment/Plan: 1. Functional deficits which require 3+ hours per day of interdisciplinary therapy in a comprehensive inpatient rehab setting.  Physiatrist is providing close team supervision and 24 hour management of active medical problems listed below.  Physiatrist and rehab team continue to assess barriers to discharge/monitor patient progress toward functional and medical goals  Care Tool:  Bathing  Bathing activity did not occur: Safety/medical concerns     Body parts bathed by helper: Right arm,Left arm,Chest,Abdomen,Front perineal area,Buttocks,Right upper leg,Left upper leg,Left lower leg,Right lower leg,Face     Bathing assist Assist Level: Total Assistance - Patient < 25%     Upper Body Dressing/Undressing Upper body dressing Upper body dressing/undressing activity did not occur  (including orthotics): Safety/medical concerns What is the patient wearing?: Hospital gown only    Upper body assist Assist Level: Total Assistance - Patient < 25%    Lower Body Dressing/Undressing Lower body dressing      What is the patient wearing?: Incontinence brief     Lower body assist Assist for lower body dressing: Dependent - Patient 0%     Toileting Toileting Toileting Activity did not occur (Clothing management and hygiene only): N/A (no void or bm)  Toileting assist Assist for toileting: Dependent - Patient 0% (incontinent)     Transfers Chair/bed transfer  Transfers assist  Chair/bed transfer activity did not occur: Safety/medical concerns  Chair/bed transfer assist level: Minimal Assistance - Patient > 75%     Locomotion Ambulation   Ambulation assist   Ambulation activity did not occur: Safety/medical concerns  Assist level: 2 helpers Assistive device: Hand held assist Max distance: 116 ft   Walk 10 feet activity   Assist  Walk 10 feet activity did not occur: Safety/medical concerns  Assist level: 2 helpers Assistive device: Hand held assist   Walk 50 feet activity   Assist Walk 50 feet with 2 turns activity did not occur: Safety/medical concerns  Assist level: 2 helpers Assistive device: Hand held assist    Walk 150 feet activity   Assist Walk 150 feet  activity did not occur: Safety/medical concerns         Walk 10 feet on uneven surface  activity   Assist Walk 10 feet on uneven surfaces activity did not occur: Safety/medical concerns         Wheelchair     Assist Will patient use wheelchair at discharge?: Yes (pt may benefit from [facility] assessment however unsafe to attempt this AM; per PT long term goals)   Wheelchair activity did not occur: Safety/medical concerns         Wheelchair 50 feet with 2 turns activity    Assist    Wheelchair 50 feet with 2 turns activity did not occur: Safety/medical  concerns       Wheelchair 150 feet activity     Assist  Wheelchair 150 feet activity did not occur: Safety/medical concerns       Blood pressure (!) 142/53, pulse 73, temperature 98 F (36.7 C), resp. rate 18, height 5\' 7"  (1.702 m), weight 55.8 kg, SpO2 100 %.  Medical Problem List and Plan: 1.  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             -patient may shower             -ELOS/Goals: mod A 2-3 weeks+             -Continue CIR therapies including PT, OT, and SLP --team conference today  - Advanced WM changes on MRI 4/12 likely related to SVD. Pt + for cocaine on admit.    -husband encouraged to come in for education (Monday). States he'll be here and can manage her. she may need SNF 2.  Impaired mobility -DVT/anticoagulation: Continue Lovenox             -antiplatelet therapy: Aspirin 325 mg 3. Pain Management: Continue Tylenol as needed 4. Agitation/psychosis: Antipsychotic: Continue zyprexa 5mg  hs and 2.5mg  dailoy  - Continue bilateral mittens as needed  -depakote 750mg  tid   -check level Monday  -needs haldol IM on occasion (twice in last week)  -melatonin 3mg  HS  -seeing improvement 5. Neuropsych: This patient is capable of making decisions on her own behalf. 6. Skin/Wound Care: Routine skin checks 7. Fluids/Electrolytes/Nutrition:    --needs full supervision with feeding   -protein supps for low albumin   -continue supplement potassium  4/25: potassium normalized.   4/28 BUN/Cr elevated today d/t inadequate intake of fluids   -5/1: weight down, continue to monitor   -prealbumin 26.3! Albumin increased   -push fluids   -continue 1/2ns IVF at [facility] 75cc/hr Filed Weights   09/14/20 0500 09/15/20 0450 09/16/20 0523  Weight: 58.7 kg 57.9 kg 55.8 kg     -check labs Monday 8.  Seizure disorder.  Vimpat 200 mg twice daily, valproic acid 1000 mg twice daily.  EEG  negative 9.  Hypertension.  Norvasc 10 mg daily     4/29 borderline control.  continue lopressor 25mg  bid 10.  History of bilateral cerebral convexity infarction frontal region.  CIR 08/19/2019-08/27/2019 11.  Hyperlipidemia.  Continue Lipitor 12.  History of polysubstance abuse.  Urine drug screen positive cocaine.   13. AKI: creatinine normal on 4/14. Some elevation d/t prerenal  14. UTI: UCX never performed.     -f/u urine cx negative 15. Dysphagia:     -4/19 diet downgraded to D1, thins  -continue and advance as clinically appropriat LOS: 16 days A FACE  TO FACE EVALUATION WAS PERFORMED  Horton Chin 09/16/2020, 12:17 PM

## 2020-09-16 NOTE — Progress Notes (Signed)
Restless, yelling and agitated from 2200-2345, PRN IM Haldol given at 2346. Slept from 0100-0700. Cindy [name] A

## 2020-09-17 LAB — COMPREHENSIVE METABOLIC PANEL
ALT: 18 U/L (ref 0–44)
AST: 22 U/L (ref 15–41)
Albumin: 2.9 g/dL — ABNORMAL LOW (ref 3.5–5.0)
Alkaline Phosphatase: 58 U/L (ref 38–126)
Anion gap: 9 (ref 5–15)
BUN: 25 mg/dL — ABNORMAL HIGH (ref 8–23)
CO2: 25 mmol/L (ref 22–32)
Calcium: 9.2 mg/dL (ref 8.9–10.3)
Chloride: 102 mmol/L (ref 98–111)
Creatinine, Ser: 1.16 mg/dL — ABNORMAL HIGH (ref 0.44–1.00)
GFR, Estimated: 54 mL/min — ABNORMAL LOW (ref >60)
Glucose, Bld: 117 mg/dL — ABNORMAL HIGH (ref 70–99)
Potassium: 4.6 mmol/L (ref 3.5–5.1)
Sodium: 136 mmol/L (ref 135–145)
Total Bilirubin: 0.4 mg/dL (ref 0.3–1.2)
Total Protein: 6.8 g/dL (ref 6.5–8.1)

## 2020-09-17 LAB — CBC
HCT: 36.3 % (ref 36.0–46.0)
Hemoglobin: 12 g/dL (ref 12.0–15.0)
MCH: 30.3 pg (ref 26.0–34.0)
MCHC: 33.1 g/dL (ref 30.0–36.0)
MCV: 91.7 fL (ref 80.0–100.0)
Platelets: 266 10*3/uL (ref 150–400)
RBC: 3.96 MIL/uL (ref 3.87–5.11)
RDW: 12.5 % (ref 11.5–15.5)
WBC: 8 10*3/uL (ref 4.0–10.5)
nRBC: 0 % (ref 0.0–0.2)

## 2020-09-17 LAB — VALPROIC ACID LEVEL: Valproic Acid Lvl: 94 ug/mL (ref 50.0–100.0)

## 2020-09-17 NOTE — Progress Notes (Signed)
Physical Therapy Session Note  Patient Details  Name: Cindy [name] MRN: [id number] Date of Birth: [birth year]  Today's Date: 09/17/2020 PT Individual Time: 1400-1430 PT Individual Time Calculation (min): 30 min   Short Term Goals: Week 2:  PT Short Term Goal 1 (Week 2): Patient will perform bed mobility with mod A >50% of the time. PT Short Term Goal 2 (Week 2): Patient will perform bed>chair transfers with max A consitently. PT Short Term Goal 3 (Week 2): Patient will perform standing balance >1 min with mod-max A. PT Short Term Goal 4 (Week 2): Patient will ambulate >25 feet with +2 assist.  Skilled Therapeutic Interventions/Progress Updates:     Patient in bed mildly restless and pulling at waist belt with B mitts donned and TV on upon PT arrival. Patient with intermittent lethargy in lying during session and no visual or verbal indicators of pain. Patient's husband not present for scheduled family education this session, CSW made aware.   Therapeutic Activity: Bed Mobility: Patient performed supine to/from sit with max A to initiate and mod A to complete mobility. Provided verbal cues for initiation and sequencing. Donned pants bed level with total A and shirt with max A with patient sitting EOB with CGA-mod A for sitting balance with intermittent R lean. Patient able to automatically initiate placing arms through shirt. Patient also able to locate and grasp PTs hand on command 2x5 trials with L hand and 1x5 trials with R hand while sitting EOB. Transfers: Patient performed sit to/from stand x4 with max A and PT blocking B knees to prevent buckling and facilitating hip extension in standing x10-20 sec each trial. Provided verbal cues for initiation, hip/trunk/knee extension in standing, and forward weight shift with manual facilitation.  Patient required increased time and rest breaks due to slow initiation, intermittent lethargy with reduced attention, and decreased activity tolerance.    Patient lying calmly in bed with meditation music playing on the computer for low stim environment with B mitts and Pose belt donned at end of session with breaks locked, bed alarm set, and all needs within reach. Handed off to OT at end of session.  Therapy Documentation Precautions:  Precautions Precautions: Fall Precaution Comments: bilateral mitts and bedrails padded Restrictions Weight Bearing Restrictions: No   Therapy/Group: Individual Therapy  Cypher Paule L Arkeem Harts PT, DPT  09/17/2020, 4:22 PM

## 2020-09-17 NOTE — Progress Notes (Signed)
Speech Language Pathology Daily Session Note  Patient Details  Name: Cindy [name] MRN: [id number] Date of Birth: 03-16-1961  Today's Date: 09/17/2020 SLP Individual Time: 1300-1330 SLP Individual Time Calculation (min): 30 min  Short Term Goals: Week 3: SLP Short Term Goal 1 (Week 3): Patient will focus attention to verbal or tactile stimuli in 25% of opportunities with Max A multimodal cues. SLP Short Term Goal 2 (Week 3): Patient will respond to basic yes/no questions in 25% of opportunities with Max A multimodal cues. SLP Short Term Goal 3 (Week 3): Patient will follow 1-step commands in 25% of opportunities with Max A multimodal cues. SLP Short Term Goal 4 (Week 3): Patient will be ~50% intelligibile at the word level with Max A multimodal cues. SLP Short Term Goal 5 (Week 3): Patient will consume current diet of Dys. 1 textures with thin liquids with minimal overt s/s of aspiration and Max A multimodal cues for use of swallowing compensatory strategies.  Skilled Therapeutic Interventions: Skilled SLP intervention focused on dysphagia. Pt was max A for feeding with dys 1 lunch tray and thin liquids. She demonstrated adequate mastication and oral clearance with dys 1 textures and thin liquids. Swallow reflex appeared timely. Husband educated on patients current diet and goals for cognition. No overt s/sx of aspiration or penetration noted with meal this session. Cont with therapy per plan of care.      Pain Pain Assessment Pain Scale: Faces Faces Pain Scale: No hurt  Therapy/Group: Individual Therapy  Cindy [name] 09/17/2020, 1:25 PM

## 2020-09-17 NOTE — Progress Notes (Signed)
PROGRESS NOTE   Subjective/Complaints: No new issues. Restless last night. Actually fairly calm this morning. Told me she was alright when I asked today  ROS: Limited due to cognitive/behavioral    Objective:   No results found. Recent Labs    09/17/20 0640  WBC 8.0  HGB 12.0  HCT 36.3  PLT 266   Recent Labs    09/17/20 0640  NA 136  K 4.6  CL 102  CO2 25  GLUCOSE 117*  BUN 25*  CREATININE 1.16*  CALCIUM 9.2    Intake/Output Summary (Last 24 hours) at 09/17/2020 1018 Last data filed at 09/17/2020 0900 Gross per 24 hour  Intake 1414.07 ml  Output --  Net 1414.07 ml        Physical Exam: Vital Signs Blood pressure 116/66, pulse 70, temperature 97.8 F (36.6 C), resp. rate 16, height 5\' 7"  (1.702 m), weight 55.8 kg, SpO2 100 %.  Constitutional: No distress . Vital signs reviewed. HEENT: EOMI, oral membranes moist Neck: supple Cardiovascular: RRR without murmur. No JVD    Respiratory/Chest: CTA Bilaterally without wheezes or rales. Normal effort    GI/Abdomen: BS +, non-tender, non-distended Ext: no clubbing, cyanosis, or edema Psych: distracted, confused,non-agitated  Neuro:  moves all 4's, confused  Assessment/Plan: 1. Functional deficits which require 3+ hours per day of interdisciplinary therapy in a comprehensive inpatient rehab setting.  Physiatrist is providing close team supervision and 24 hour management of active medical problems listed below.  Physiatrist and rehab team continue to assess barriers to discharge/monitor patient progress toward functional and medical goals  Care Tool:  Bathing  Bathing activity did not occur: Safety/medical concerns     Body parts bathed by helper: Right arm,Left arm,Chest,Abdomen,Front perineal area,Buttocks,Right upper leg,Left upper leg,Left lower leg,Right lower leg,Face     Bathing assist Assist Level: Total Assistance - Patient < 25%     Upper  Body Dressing/Undressing Upper body dressing Upper body dressing/undressing activity did not occur (including orthotics): Safety/medical concerns What is the patient wearing?: Hospital gown only    Upper body assist Assist Level: Total Assistance - Patient < 25%    Lower Body Dressing/Undressing Lower body dressing      What is the patient wearing?: Incontinence brief     Lower body assist Assist for lower body dressing: Dependent - Patient 0%     Toileting Toileting Toileting Activity did not occur (Clothing management and hygiene only): N/A (no void or bm)  Toileting assist Assist for toileting: Dependent - Patient 0% (incontinent)     Transfers Chair/bed transfer  Transfers assist  Chair/bed transfer activity did not occur: Safety/medical concerns  Chair/bed transfer assist level: Minimal Assistance - Patient > 75%     Locomotion Ambulation   Ambulation assist   Ambulation activity did not occur: Safety/medical concerns  Assist level: 2 helpers Assistive device: Hand held assist Max distance: 116 ft   Walk 10 feet activity   Assist  Walk 10 feet activity did not occur: Safety/medical concerns  Assist level: 2 helpers Assistive device: Hand held assist   Walk 50 feet activity   Assist Walk 50 feet with 2 turns activity did not occur:  Safety/medical concerns  Assist level: 2 helpers Assistive device: Hand held assist    Walk 150 feet activity   Assist Walk 150 feet activity did not occur: Safety/medical concerns         Walk 10 feet on uneven surface  activity   Assist Walk 10 feet on uneven surfaces activity did not occur: Safety/medical concerns         Wheelchair     Assist Will patient use wheelchair at discharge?: Yes (pt may benefit from [facility] assessment however unsafe to attempt this AM; per PT long term goals)   Wheelchair activity did not occur: Safety/medical concerns         Wheelchair 50 feet with 2 turns  activity    Assist    Wheelchair 50 feet with 2 turns activity did not occur: Safety/medical concerns       Wheelchair 150 feet activity     Assist  Wheelchair 150 feet activity did not occur: Safety/medical concerns       Blood pressure 116/66, pulse 70, temperature 97.8 F (36.6 C), resp. rate 16, height 5\' 7"  (1.702 m), weight 55.8 kg, SpO2 100 %.  Medical Problem List and Plan: 1.  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             -patient may shower             -ELOS/Goals: mod A 2-3 weeks+             -Continue CIR therapies including PT, OT, and SLP --team conference today  - Advanced WM changes on MRI 4/12 likely related to SVD. Pt + for cocaine on admit.    -husband encouraged to come in for education today. States he can manage her. she may need SNF 2.  Impaired mobility -DVT/anticoagulation: Continue Lovenox             -antiplatelet therapy: Aspirin 325 mg 3. Pain Management: Continue Tylenol as needed 4. Agitation/psychosis: Antipsychotic: Continue zyprexa 5mg  hs and 2.5mg  dailoy  - Continue bilateral mittens as needed  -depakote 750mg  tid   -check level Monday  -needs haldol IM on occasion (twice in last week)  -melatonin 3mg  HS  -some improvement 5. Neuropsych: This patient is capable of making decisions on her own behalf. 6. Skin/Wound Care: Routine skin checks 7. Fluids/Electrolytes/Nutrition:    --needs full supervision with feeding   -protein supps for low albumin   -continue supplement potassium  4/25: potassium normalized.   4/28 BUN/Cr elevated today d/t inadequate intake of fluids   -weight trending down Filed Weights   09/14/20 0500 09/15/20 0450 09/16/20 0523  Weight: 58.7 kg 57.9 kg 55.8 kg    5/2 BUN/Cr 25/1.16  -continue IVF at HS  -actually ate fairly well yesterday 8.  Seizure disorder.  Vimpat 200 mg twice daily, valproic  acid 1000 mg twice daily.  EEG negative 9.  Hypertension.  Norvasc 10 mg daily     5/2 borderline control.  continue lopressor 25mg  bid 10.  History of bilateral cerebral convexity infarction frontal region.  CIR 08/19/2019-08/27/2019 11.  Hyperlipidemia.  Continue Lipitor 12.  History of polysubstance abuse.  Urine drug screen positive cocaine.   13. AKI: creatinine normal on 4/14. Some elevation d/t prerenal  14. UTI: UCX never performed.     -f/u urine cx negative 15. Dysphagia:     -4/19 diet downgraded to D1, thins  -continue  and advance as clinically appropriat LOS: 17 days A FACE TO FACE EVALUATION WAS PERFORMED  Ranelle Oyster 09/17/2020, 10:18 AM

## 2020-09-17 NOTE — Progress Notes (Signed)
Patient ID: Cindy [name], female   DOB: [birth year], 60 y.o.   MRN: [id number]  Per PT/OT therapists, pt husband did not show up for family education. Through chart review, husband did show up for speech therapy.   SW called pt husband Cindy [name] to discuss family education and the reason he was not here. He reported that he came for speech therapy session. SW discussed another family therapy session. Fam edu confirmed for Wednesday (5/4) 1pm-3pm. SW reminded him to not leave. He reports being very nervous about being in the hospital.   [name], MSW, LCSWA Office: [phone] Cell: [phone] Fax: [phone]

## 2020-09-17 NOTE — Progress Notes (Signed)
Occupational Therapy Session Note  Patient Details  Name: Cindy [name] MRN: [id number] Date of Birth: [birth year]  Today's Date: 09/17/2020 OT Individual Time: 1430-1500 OT Individual Time Calculation (min): 30 min   Skilled Therapeutic Interventions/Progress Updates:    Pt greeted in bed via PT handoff. Family not present for family education. With bed placed in chair position to work on trunk control, worked on self feeding skills when consuming chocolate pudding. Pt initially required Total A, progressing to tolerating hand over hand assistance with the Lt hand to help OT. Pt cooperative during session, increased initiation noted when dabbing her mouth with the towel over her shirt. Pt very vocal, verbalizing "[employer]" and "husband" during conversation, but unable to discern context due to decreased speech inteligibilty. Total A to sanitize hands with sanitizer and apply lotion to upper arms, pt tolerating minimal hand over hand assistance at this time, becoming more rigid in elbow flexion. Difficult to don her hand mitts due to this as well but they were donned. Pt remained in bed at close of session, all needs within reach, bed alarm set, 4 bedrails up, and fall mat in place.   Therapy Documentation Precautions:  Precautions Precautions: Fall Precaution Comments: bilateral mitts and bedrails padded Restrictions Weight Bearing Restrictions: No Vital Signs: Therapy Vitals Temp: 97.8 F (36.6 C) Temp Source: Oral Pulse Rate: 75 Resp: 17 BP: 120/70 Oxygen Therapy SpO2: 100 % O2 Device: Room Air Pain: no s/s pain during tx Pain Assessment Pain Scale: Faces Faces Pain Scale: No hurt ADL: ADL Eating: Unable to assess Grooming: Unable to assess Upper Body Bathing: Unable to assess Lower Body Bathing: Unable to assess Upper Body Dressing: Unable to assess Lower Body Dressing: Dependent Where Assessed-Lower Body Dressing: Bed level Toileting: Unable to assess Toilet  Transfer: Unable to assess Tub/Shower Transfer: Unable to assess Psychologist, counselling Transfer: Unable to assess     Therapy/Group: Individual Therapy  Toddrick Sanna A Lucky Alverson 09/17/2020, 3:52 PM

## 2020-09-17 NOTE — Progress Notes (Signed)
Nutrition Follow-up  DOCUMENTATION CODES:   Non-severe (moderate) malnutrition in context of chronic illness  INTERVENTION:   - Continue Ensure Enlive po TID, each supplement provides 350 kcal and 20 grams of protein  - Continue Magic Cup TID with meals, each supplement provides 290 kcal and 9 grams of protein  - Continue MVI with minerals daily  - Continue feeding assistance with all meals  NUTRITION DIAGNOSIS:   Moderate Malnutrition related to chronic illness as evidenced by moderate fat depletion,moderate muscle depletion.  New diagnosis after completion of NFPE  GOAL:   Patient will meet greater than or equal to 90% of their needs  Progressing  MONITOR:   PO intake,Supplement acceptance,Diet advancement,Labs,Weight trends  REASON FOR ASSESSMENT:   Malnutrition Screening Tool    ASSESSMENT:   Pt with a PMH significant for HTN, HLD, bilateral cerebral convexity infarct frontal region receiving CIR 08/19/19-08/27/19, seizure with prior history of status epilepticus, and polysubstance abuse presented 08/23/2020 with AMS/slurred speech, R-sided weakness as well as reports of shaking and jerking episodes lasting 5-10 minutes. Cranial CT/MRI scan was performed and was negative for acute changes but did show remote lacunar infarct. EEG suggestive of cortical dysfunction arising from left hemisphere likely 2/2 underlying structural abnormality post ictal state no seizure or definite epileptiform discharges. Admitted to CIR 4/15.  Per SLP note, pt completed 45% of breakfast meal tray this morning and consumed 3 oz of thin liquids. Pt refused further PO trials and became agitated and began hallucinating by end of SLP session.  Pt did not respond to RD during visit. PO intake has been variable but appears to be improving overall.  Admit weight: 55.1 kg Current weight: 55.6 kg  Pt's weight is stable currently compared to CIR admit weight. Pt has experienced a 5.6 kg weight loss  since 02/18/20. This is a 9.2% weight loss in 7 months which is not quite significant for timeframe.  Based on NFPE, pt meets criteria for malnutrition.  Meal Completion: 0-100% x last 8 meals (averaging 66%)  Medications reviewed and include: Ensure Enlive TID, folic acid, melatonin, MVI with minerals, zyprexa, protonix, klor-con, thiamine IVF: 1/2NS @ 75 ml/hr q HS  Labs from 5/02 reviewed: BUN 25, creatinine 1.16  NUTRITION - FOCUSED PHYSICAL EXAM:  Flowsheet Row Most Recent Value  Orbital Region Moderate depletion  Upper Arm Region Moderate depletion  Thoracic and Lumbar Region Moderate depletion  Buccal Region Mild depletion  Temple Region Moderate depletion  Clavicle Bone Region Moderate depletion  Clavicle and Acromion Bone Region Moderate depletion  Scapular Bone Region Moderate depletion  Dorsal Hand Moderate depletion  Patellar Region Moderate depletion  Anterior Thigh Region Moderate depletion  Posterior Calf Region Moderate depletion  Edema (RD Assessment) None  Hair Reviewed  Eyes Reviewed  Mouth Reviewed  Skin Reviewed  Nails Reviewed       Diet Order:   Diet Order            DIET - DYS 1 Room service appropriate? Yes; Fluid consistency: Thin  Diet effective now                 EDUCATION NEEDS:   Not appropriate for education at this time  Skin:  Skin Assessment: Reviewed RN Assessment  Last BM:  09/16/20  Height:   Ht Readings from Last 1 Encounters:  09/03/20 5\' 7"  (1.702 m)    Weight:   Wt Readings from Last 1 Encounters:  09/18/20 55.6 kg    BMI:  Body  mass index is 19.2 kg/m.  Estimated Nutritional Needs:   Kcal:  1800-2000  Protein:  90-100 grams  Fluid:  >1.8L    Mertie Clause, MS, RD, LDN Inpatient Clinical Dietitian Please see [facility] for contact information.

## 2020-09-17 NOTE — Progress Notes (Signed)
Awake most of night, not yelling, but restless. Total care. HS IVF's. Alfredo Martinez A

## 2020-09-17 NOTE — Progress Notes (Signed)
Physical Therapy Weekly Progress Note  Patient Details  Name: Cindy [name] MRN: [id number] Date of Birth: [birth year]  Beginning of progress report period: September 09, 2020 End of progress report period: Sep 18, 2020  Today's Date: 09/18/2020 PT Individual Time: 1350-1415 PT Individual Time Calculation (min): 25 min   Patient has met 2 of 4 short term goals.  Patient with slow and inconsistent progress during stay due to lethargy, agitation, and decreased awareness. She currently requires max A for bed mobility and transfers, and inconsistently ambulates 10-100 feet with max A +2. Family not present for education on patient's progress, recommending SNF placement at this time due to increased caregiver burden with increased level of assist and inconsistent performance from patient.   Patient continues to demonstrate the following deficits muscle weakness and muscle joint tightness, decreased cardiorespiratoy endurance, impaired timing and sequencing, decreased coordination and decreased motor planning, decreased midline orientation and decreased attention to right, decreased initiation, decreased attention, decreased awareness, decreased problem solving, decreased safety awareness, decreased memory and delayed processing and decreased sitting balance, decreased standing balance, decreased postural control, hemiplegia and decreased balance strategies and therefore will continue to benefit from skilled PT intervention to increase functional independence with mobility.  Patient not progressing toward long term goals.  See goal revision. Plan of care revisions: d/c'd w/c goals, downgraded gait and transfer goals to max A..  PT Short Term Goals Week 2:  PT Short Term Goal 1 (Week 2): Patient will perform bed mobility with mod A >50% of the time. PT Short Term Goal 1 - Progress (Week 2): Progressing toward goal PT Short Term Goal 2 (Week 2): Patient will perform bed>chair transfers with max A  consitently. PT Short Term Goal 2 - Progress (Week 2): Progressing toward goal PT Short Term Goal 3 (Week 2): Patient will perform standing balance >1 min with mod-max A. PT Short Term Goal 3 - Progress (Week 2): Met PT Short Term Goal 4 (Week 2): Patient will ambulate >25 feet with +2 assist. PT Short Term Goal 4 - Progress (Week 2): Met Week 3:  PT Short Term Goal 1 (Week 3): STG=LTG due to ELOS.  Skilled Therapeutic Interventions/Progress Updates:     Patient in bed alone talking to herself or someone she perceived to be in the room with her upon PT arrival. Patient calm and followed cues for mobility throughout session with increased time for initiation and motor planning. Indicated no signs of pain throughout session.   Therapeutic Activity: Bed Mobility: Patient performed supine to/from sit with max A of 1-2, intermittent assist from second person for trunk control. Provided verbal cues for initiation. Transfers: Patient performed sit to/from stand x2 with max A of 1-2. Provided verbal cues for initiation and forward weight shift. Pulled up on the sink on second trial with improved forward weight shift.  Neuromuscular Re-ed: Patient performed the following standing balance activities for improved standing tolerance and lower extremity motor contorl: -standing with B HHA with max A +2 focused on midline orientation to reduce posterior bias, patient progressed to stepping forwards for improved motor control in reciprocal pattern, patient reached out to the sink and continued standing balance with B upper extremity support ~2 min with min A and second person SBA for safety -she stood at the sink with min A >5 min engaging in self initiated functional task of washing her hands, able to turn on the sink following PT demonstration, required hand-over-hand to complete reaching for th soap x2, performed  hand washing with B upper extremities on second trial with mod cues, and dried her hands with  set-up assist Required significant time with all activities due to delayed processing and initiation and decreased motor planning. Standing at the sink improved forward weight shift and reduced level of assist for standing balance.  Patient in bed with posey belt and B mitts donned and meditation music playing on the computer at end of session with breaks locked, bed and posey belt alarm set, and all needs within reach.    Therapy Documentation Precautions:  Precautions Precautions: Fall Precaution Comments: bilateral mitts and bedrails padded Restrictions Weight Bearing Restrictions: No   Therapy/Group: Individual Therapy  Alliene Klugh L Eduin Friedel PT, DPT  09/18/2020, 9:30 PM

## 2020-09-18 MED ORDER — DIVALPROEX SODIUM 125 MG PO CSDR
500.0000 mg | DELAYED_RELEASE_CAPSULE | Freq: Three times a day (TID) | ORAL | Status: DC
  Administered 2020-09-18 – 2020-10-27 (×116): 500 mg via ORAL
  Filled 2020-09-18 (×117): qty 4

## 2020-09-18 NOTE — Patient Care Conference (Signed)
Inpatient RehabilitationTeam Conference and Plan of Care Update Date: 09/18/2020   Time: 10:07 AM    Patient Name: Cindy [name]      Medical Record Number: [id number]  Date of Birth: 02/08/1961 Sex: Female         Room/Bed: 4W08C/4W08C-01 Payor Info: Payor: MEDICAID [place] / Plan: MEDICAID [place] / Product Type: *No Product type* /    Admit Date/Time:  08/31/2020  5:12 PM  Primary Diagnosis:  Acute metabolic encephalopathy  Hospital Problems: Principal Problem:   Acute metabolic encephalopathy    Expected Discharge Date: Expected Discharge Date: 09/22/20  Team Members Present: Physician leading conference: Dr. Faith Rogue Care Coodinator Present: [name], LCSWA;Abrianna Sidman Marlyne Beards, RN, BSN, CRRN Nurse Present: Kennyth Arnold, RN PT Present: Serina Cowper, PT OT Present: Jake Shark, OT SLP Present: Feliberto Gottron, SLP PPS Coordinator present : Fae Pippin, SLP     Current Status/Progress Goal Weekly Team Focus  Bowel/Bladder   Incontinent of B/B LBM 04/30  regain function/continence of B/B  timed toileting   Swallow/Nutrition/ Hydration   Dys. 1 textures with thin liquids, Max A  Mod A  safety and tolerance with current diet, family edu   ADL's   No real progress- max-total A  POC adjusted to d/c ADLs and reflect caregiver total A  Initiating, ADL retraining, motor planning, family education   Mobility   Max A overall, intermittent mod A, gait >100 ft max +2 (1 occurance with inconsistent performance/participation)  Modified goals: mod A transfers, hoyer lift transfers for variable performance, d/c w/c goals  Functional mobility, activity tolerance, behavior managment, attention, safety awareness, midline orientation, balance, sitting tolerance, caregiver education   Communication   Max A  Mod A  use of speech intelligibility strategies, family education   Safety/Cognition/ Behavioral Observations  Total A  Max A  focused attention, following commands, family  education   Pain   c/o generalized pain medicated with relief as directed  pain <=2/10  assess pain qshift and prn   Skin   skin intact  skin will remain intact and free of infection  assess skin qshift and prn     Discharge Planning:  Pt to discharge to home with her husband who will provide 24/7 care. Fam edu scheduled for 5/2 and husband only stayed for SLP session. He is nervous about being in the hospital. Fam edu scheduled again for Wednesday (5/4) 1pm-3pm.   Team Discussion: Not a lot of change, labs are improving. Incontinent B/B. Husband needs to plan to stay all day for therapy sessions. Patient on target to meet rehab goals: no, max to total assist for all care. Total assist for all mobility. Dys 1, thin liquids, total assist for cognition.  *See Care Plan and progress notes for long and short-term goals.   Revisions to Treatment Plan:  Not at this time.  Teaching Needs: Family education, medication management, transfer training, bed mobility training, hoyer lift training, balance training, endurance training, safety awareness.  Current Barriers to Discharge: Decreased caregiver support, Medical stability, Home enviroment access/layout, Incontinence, Lack of/limited family support, Insurance for SNF coverage, Medication compliance and Behavior  Possible Resolutions to Barriers: Continue current medications, provide emotional support.     Medical Summary Current Status: still cognitively impaired. behavior a little better. trying to avoid agitation and somnolence extremes.  Barriers to Discharge: Medical stability   Possible Resolutions to Barriers/Weekly Focus: Advertising copywriter. behavioral mgt   Continued Need for Acute Rehabilitation Level of Care: The patient requires  daily medical management by a physician with specialized training in physical medicine and rehabilitation for the following reasons: Direction of a multidisciplinary physical rehabilitation program to  maximize functional independence : Yes Medical management of patient stability for increased activity during participation in an intensive rehabilitation regime.: Yes Analysis of laboratory values and/or radiology reports with any subsequent need for medication adjustment and/or medical intervention. : Yes   I attest that I was present, lead the team conference, and concur with the assessment and plan of the team.   Tennis Must 09/18/2020, 2:15 PM

## 2020-09-18 NOTE — Progress Notes (Signed)
Patient ID: Cindy [name], female   DOB: 03/08/1961, 60 y.o.   MRN: [id number]  Per speech therapist, pt husband Cindy [name] was here and wanted to speak with SW. When SW came out to meet him after 10 minutes, he was gone.   [name], MSW, LCSWA Office: [phone] Cell: [phone] Fax: [phone]

## 2020-09-18 NOTE — Plan of Care (Signed)
  Problem: RH Wheelchair Mobility Goal: LTG Patient will propel w/c in controlled environment (PT) Description: LTG: Patient will propel wheelchair in controlled environment, # of feet with assist (PT) Outcome: Not Progressing Flowsheets (Taken 09/18/2020 2145) LTG: Pt will propel w/c in controlled environ  assist needed:: (d/c goal due to lack of progress) -- Note: d/c goal due to lack progress Goal: LTG Patient will propel w/c in home environment (PT) Description: LTG: Patient will propel wheelchair in home environment, # of feet with assistance (PT). Outcome: Not Progressing Flowsheets (Taken 09/18/2020 2145) LTG: Pt will propel w/c in home environ  assist needed:: (d/c goal due to lack progress) -- Note: d/c goal due to lack progress   Problem: RH Bed Mobility Goal: LTG Patient will perform bed mobility with assist (PT) Description: LTG: Patient will perform bed mobility with assistance, with/without cues (PT). Flowsheets (Taken 09/18/2020 2145) LTG: Pt will perform bed mobility with assistance level of: (downgraded due to limited progress with functional mobility) Maximal Assistance - Patient 25 - 49% Note: downgraded due to limited progress with functional mobility   Problem: RH Bed to Chair Transfers Goal: LTG Patient will perform bed/chair transfers w/assist (PT) Description: LTG: Patient will perform bed to chair transfers with assistance (PT). Flowsheets (Taken 09/18/2020 2145) LTG: Pt will perform Bed to Chair Transfers with assistance level: (downgraded due to limited progress with functional mobility) Maximal Assistance - Patient 25 - 49% Note: downgraded due to limited progress with functional mobility   Problem: RH Ambulation Goal: LTG Patient will ambulate in controlled environment (PT) Description: LTG: Patient will ambulate in a controlled environment, # of feet with assistance (PT). Flowsheets (Taken 09/18/2020 2145) LTG: Pt will ambulate in controlled environ  assist  needed:: (downgraded due to limited progress with functional mobility) Maximal Assistance - Patient 25 - 49% Note: downgraded due to limited progress with functional mobility

## 2020-09-18 NOTE — Progress Notes (Signed)
PROGRESS NOTE   Subjective/Complaints: Pt in bed. Comfortable. Confused. No problems overnight but intermittently restless,agitated  ROS: Limited due to cognitive/behavioral    Objective:   No results found. Recent Labs    09/17/20 0640  WBC 8.0  HGB 12.0  HCT 36.3  PLT 266   Recent Labs    09/17/20 0640  NA 136  K 4.6  CL 102  CO2 25  GLUCOSE 117*  BUN 25*  CREATININE 1.16*  CALCIUM 9.2    Intake/Output Summary (Last 24 hours) at 09/18/2020 1146 Last data filed at 09/18/2020 0802 Gross per 24 hour  Intake 340 ml  Output --  Net 340 ml        Physical Exam: Vital Signs Blood pressure (!) 141/92, pulse 78, temperature 98.4 F (36.9 C), temperature source Oral, resp. rate 18, height 5\' 7"  (1.702 m), weight 55.6 kg, SpO2 100 %.  Constitutional: No distress . Vital signs reviewed. HEENT: EOMI, oral membranes moist Neck: supple Cardiovascular: RRR without murmur. No JVD    Respiratory/Chest: CTA Bilaterally without wheezes or rales. Normal effort    GI/Abdomen: BS +, non-tender, non-distended Ext: no clubbing, cyanosis, or edema Psych: confused, restless  Neuro:  moves all 4's, confused. dysarthric  Assessment/Plan: 1. Functional deficits which require 3+ hours per day of interdisciplinary therapy in a comprehensive inpatient rehab setting.  Physiatrist is providing close team supervision and 24 hour management of active medical problems listed below.  Physiatrist and rehab team continue to assess barriers to discharge/monitor patient progress toward functional and medical goals  Care Tool:  Bathing  Bathing activity did not occur: Safety/medical concerns     Body parts bathed by helper: Right arm,Left arm,Chest,Abdomen,Front perineal area,Buttocks,Right upper leg,Left upper leg,Left lower leg,Right lower leg,Face     Bathing assist Assist Level: Total Assistance - Patient < 25%     Upper  Body Dressing/Undressing Upper body dressing Upper body dressing/undressing activity did not occur (including orthotics): Safety/medical concerns What is the patient wearing?: Hospital gown only    Upper body assist Assist Level: Total Assistance - Patient < 25%    Lower Body Dressing/Undressing Lower body dressing      What is the patient wearing?: Incontinence brief     Lower body assist Assist for lower body dressing: Dependent - Patient 0%     Toileting Toileting Toileting Activity did not occur (Clothing management and hygiene only): N/A (no void or bm)  Toileting assist Assist for toileting: Dependent - Patient 0% (incontinent)     Transfers Chair/bed transfer  Transfers assist  Chair/bed transfer activity did not occur: Safety/medical concerns  Chair/bed transfer assist level: Minimal Assistance - Patient > 75%     Locomotion Ambulation   Ambulation assist   Ambulation activity did not occur: Safety/medical concerns  Assist level: 2 helpers Assistive device: Hand held assist Max distance: 116 ft   Walk 10 feet activity   Assist  Walk 10 feet activity did not occur: Safety/medical concerns  Assist level: 2 helpers Assistive device: Hand held assist   Walk 50 feet activity   Assist Walk 50 feet with 2 turns activity did not occur: Safety/medical concerns  Assist  level: 2 helpers Assistive device: Hand held assist    Walk 150 feet activity   Assist Walk 150 feet activity did not occur: Safety/medical concerns         Walk 10 feet on uneven surface  activity   Assist Walk 10 feet on uneven surfaces activity did not occur: Safety/medical concerns         Wheelchair     Assist Will patient use wheelchair at discharge?: Yes (pt may benefit from [facility] assessment however unsafe to attempt this AM; per PT long term goals)   Wheelchair activity did not occur: Safety/medical concerns         Wheelchair 50 feet with 2 turns  activity    Assist    Wheelchair 50 feet with 2 turns activity did not occur: Safety/medical concerns       Wheelchair 150 feet activity     Assist  Wheelchair 150 feet activity did not occur: Safety/medical concerns       Blood pressure (!) 141/92, pulse 78, temperature 98.4 F (36.9 C), temperature source Oral, resp. rate 18, height 5\' 7"  (1.702 m), weight 55.6 kg, SpO2 100 %.  Medical Problem List and Plan: 1.  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             -patient may shower             -ELOS/Goals: mod A 2-3 weeks+             -Continue CIR therapies including PT, OT, and SLP --team conference today  - Advanced WM changes on MRI 4/12 likely related to SVD. Pt + for cocaine on admit.    -trying again to bring husband in for education 2.  Impaired mobility -DVT/anticoagulation: Continue Lovenox             -antiplatelet therapy: Aspirin 325 mg 3. Pain Management: Continue Tylenol as needed 4. Agitation/psychosis: Antipsychotic: Continue zyprexa 5mg  hs and 2.5mg  daily  - Continue bilateral mittens as needed  -depakote 750mg  tid   -level was 94 on Monday   -reduce to 500mg  bid  -melatonin 3mg  HS    5. Neuropsych: This patient is capable of making decisions on her own behalf. 6. Skin/Wound Care: Routine skin checks 7. Fluids/Electrolytes/Nutrition:    --needs full supervision with feeding   -protein supps for low albumin   -continue supplement potassium  4/25: potassium normalized.   4/28 BUN/Cr elevated today d/t inadequate intake of fluids   -weight trending down Filed Weights   09/15/20 0450 09/16/20 0523 09/18/20 0413  Weight: 57.9 kg 55.8 kg 55.6 kg    5/2 BUN/Cr 25/1.16  -will stop IVF at HS and observe for now  -intake can fluctuate based on cognitive status 8.  Seizure disorder.  Vimpat 200 mg twice daily, valproic acid 1000 mg twice daily.   EEG negative 9.  Hypertension.  Norvasc 10 mg daily     5/3 borderline control.  continue lopressor 25mg  bid 10.  History of bilateral cerebral convexity infarction frontal region.  CIR 08/19/2019-08/27/2019 11.  Hyperlipidemia.  Continue Lipitor 12.  History of polysubstance abuse.  Urine drug screen positive cocaine.   13. AKI: creatinine normal on 4/14. Some elevation d/t prerenal  14. UTI: UCX never performed.     -f/u urine cx negative 15. Dysphagia:     -4/19 diet downgraded to D1, thins  -continue and advance as clinically  appropriat LOS: 18 days A FACE TO FACE EVALUATION WAS PERFORMED  Ranelle Oyster 09/18/2020, 11:46 AM

## 2020-09-18 NOTE — Progress Notes (Signed)
Speech Language Pathology Daily Session Note  Patient Details  Name: Cindy [name] MRN: [id number] Date of Birth: December 05, 1960  Today's Date: 09/18/2020 SLP Individual Time: 0730-0800 SLP Individual Time Calculation (min): 30 min  Short Term Goals: Week 3: SLP Short Term Goal 1 (Week 3): Patient will focus attention to verbal or tactile stimuli in 25% of opportunities with Max A multimodal cues. SLP Short Term Goal 2 (Week 3): Patient will respond to basic yes/no questions in 25% of opportunities with Max A multimodal cues. SLP Short Term Goal 3 (Week 3): Patient will follow 1-step commands in 25% of opportunities with Max A multimodal cues. SLP Short Term Goal 4 (Week 3): Patient will be ~50% intelligibile at the word level with Max A multimodal cues. SLP Short Term Goal 5 (Week 3): Patient will consume current diet of Dys. 1 textures with thin liquids with minimal overt s/s of aspiration and Max A multimodal cues for use of swallowing compensatory strategies.  Skilled Therapeutic Interventions: Skilled SLP intervention focused on dysphagia. Pt total A for feeding with dys 1 texture and thin liquids. She consumed 45% of meal and 3 oz of thin liquids. Se refused further po trials. Pt became very agitated and began hallucinating by end of session. No further trials completed. Pt tolerated with no overt s/sx of aspiration or penetration. Cont with therapy per plan of care.      Pain Pain Assessment Pain Scale: Faces Faces Pain Scale: No hurt  Therapy/Group: Individual Therapy  Carlean Jews Gianina Olinde 09/18/2020, 7:58 AM

## 2020-09-18 NOTE — Progress Notes (Signed)
Occupational Therapy Session Note  Patient Details  Name: Cindy [name] MRN: [id number] Date of Birth: 1960/07/28  Today's Date: 09/18/2020 OT Individual Time: 4580-9983 OT Individual Time Calculation (min): 25 min    Short Term Goals: Week 3:  OT Short Term Goal 1 (Week 3): Pt will self feed wiht MOD HOH A 25% of meal OT Short Term Goal 2 (Week 3): Pt will tolerate OOB actiivty 1x per day OT Short Term Goal 3 (Week 3): Pt will roll withMOD A and no agitation in prep for self care  Skilled Therapeutic Interventions/Progress Updates:    pt supine, restless and muttering to herself. Assisted RN in administering pain medication, pt willing but so restless she has difficulty getting spoon to mouth. Pt began swinging her legs over the side of the bedrail. Bedrail was put down and pt assisted to EOB- max A. Very poor sitting balance, max A required to combat frequent lunging forward, R and L. Assisted pt in donning new brief and pants- max A. Pt stood from EOB to pull up pants, max +2 assist with poor trunk and hip extension. Pt began grabbing in direction of w/c so she was transferred to chair with max +2 assist stand pivot. She settled briefly in the TIS w/c before becoming restless again. Assisted pt in changing shirt- total A. Pt was returned to supine in bed for safety. She was left supine with posey belt on, B mitts on, and bed alarm set.   Therapy Documentation Precautions:  Precautions Precautions: Fall Precaution Comments: bilateral mitts and bedrails padded Restrictions Weight Bearing Restrictions: No  Therapy/Group: Individual Therapy  Crissie Reese 09/18/2020, 6:29 AM

## 2020-09-19 NOTE — Progress Notes (Signed)
Patient ID: Cindy [name], female   DOB: 12-06-1960, 60 y.o.   MRN: [id number]  SW called pt husband multiple times to follow-up about family education today. SW made contact with her husband [name] who stated he thought he needed to be here at 2pm. He stated he will be here at 2pm.  [name], MSW, LCSWA Office: [phone] Cell: [phone] Fax: [phone]

## 2020-09-19 NOTE — Progress Notes (Signed)
Physical Therapy Session Note  Patient Details  Name: Cindy [name] MRN: [id number] Date of Birth: 08-31-1960  Today's Date: 09/19/2020 PT Individual Time: 0904-0920 PT Individual Time Calculation (min): 16 min   Short Term Goals: Week 3:  PT Short Term Goal 1 (Week 3): STG=LTG due to ELOS.  Skilled Therapeutic Interventions/Progress Updates:     Patient with door closed and asleep in the bed with her head hanging off the side, HOB at 50 deg, posey belt and B mitts in place. Repositioned patient to safe position in the middle of the bed, reduced HOB <40 deg, slid patient up in the bed, and placed pillows on B sides of patient with total A and with minimal arousal from patient. Patient asked for another blanket during transition, then promptly returned to sleep. Placed sings in patient's room to maintain the door open when staff is not present to allow staff line-off sight for patient's safety in the bed. Also placed sign to maintain HOB <40 deg at rest to reduce sliding in the bed and play meditation music for low stim environment, without TV on at this time to manage agitation, restlessness, and hallucinations. Discussed signs and safety concerns with RN, RN in agreement. Patient asleep in bed with posey belt and B hand mitts in place and meditation music playing on the computer with breaks locked, bed and posey belt alarm set, and all needs in reach.   Therapy Documentation Precautions:  Precautions Precautions: Fall Precaution Comments: bilateral mitts and bedrails padded Restrictions Weight Bearing Restrictions: No General: PT Amount of Missed Time (min): 14 Minutes PT Missed Treatment Reason: Patient fatigue   Therapy/Group: Individual Therapy  Olga Seyler L Tamer Baughman PT, DPT  09/19/2020, 9:28 AM

## 2020-09-19 NOTE — Plan of Care (Signed)
  Problem: Consults Goal: RH STROKE PATIENT EDUCATION Description: See Patient Education module for education specifics  Outcome: Progressing   Problem: RH BOWEL ELIMINATION Goal: RH STG MANAGE BOWEL WITH ASSISTANCE Description: STG Manage Bowel with min Assistance. Outcome: Progressing Goal: RH STG MANAGE BOWEL W/MEDICATION W/ASSISTANCE Description: STG Manage Bowel with Medication with min Assistance. Outcome: Progressing   Problem: RH BLADDER ELIMINATION Goal: RH STG MANAGE BLADDER WITH ASSISTANCE Description: STG Manage Bladder With min Assistance Outcome: Progressing   Problem: RH SKIN INTEGRITY Goal: RH STG MAINTAIN SKIN INTEGRITY WITH ASSISTANCE Description: STG Maintain Skin Integrity With min Assistance. Outcome: Progressing Goal: RH STG ABLE TO PERFORM INCISION/WOUND CARE W/ASSISTANCE Description: STG Able To Perform Incision/Wound Care With min Assistance. Outcome: Progressing   Problem: RH SAFETY Goal: RH STG ADHERE TO SAFETY PRECAUTIONS W/ASSISTANCE/DEVICE Description: STG Adhere to Safety Precautions With min Assistance/Device. Outcome: Progressing Goal: RH STG DECREASED RISK OF FALL WITH ASSISTANCE Description: STG Decreased Risk of Fall With min Assistance. Outcome: Progressing   Problem: RH PAIN MANAGEMENT Goal: RH STG PAIN MANAGED AT OR BELOW PT'S PAIN GOAL Description: <3 on a 0-10 pain scale. Outcome: Progressing

## 2020-09-19 NOTE — Progress Notes (Signed)
Occupational Therapy Session Note  Patient Details  Name: Cindy [name] MRN: [id number] Date of Birth: 08-13-1960  Today's Date: 09/19/2020 OT Individual Time: 1130-1210 OT Individual Time Calculation (min): 40 min    Short Term Goals: Week 3:  OT Short Term Goal 1 (Week 3): Pt will self feed wiht MOD HOH A 25% of meal OT Short Term Goal 2 (Week 3): Pt will tolerate OOB actiivty 1x per day OT Short Term Goal 3 (Week 3): Pt will roll withMOD A and no agitation in prep for self care  Skilled Therapeutic Interventions/Progress Updates:   Pt supine, restless and quiet. Pt pulling at incontinence brief. Pt was assisted in changing brief at bed level, incontinent of urine. She required total A for peri hygiene. Pt was transferred to EOB with total A. Poor sitting balance EOB, requiring max A to maintain upright with forward flexed posture. Pt completed sit > stand with max A and was able to maintain standing with only min A by wrapping both arms around OT. Stand pivot to the TIS w/c with max A. Pt was able to initiate grooming task, washing her face when handed a washcloth. She was unable to follow commands for UB bathing, perseverating on washing her face only. Max A for UB bathing. Pt did participate in UB dressing, attempting to following sequence and pull shirt overhead. Pt was brought out into the hallway in her TIS w/c for change of environment and to increase orientation. Pt much less restless. Pt returned to her room and was transferred back to bed. She was left supine with posey belt on, B mitts and bed alarm set.   Therapy Documentation Precautions:  Precautions Precautions: Fall Precaution Comments: bilateral mitts and bedrails padded Restrictions Weight Bearing Restrictions: No   Therapy/Group: Individual Therapy  Crissie Reese 09/19/2020, 6:29 AM

## 2020-09-19 NOTE — Progress Notes (Signed)
Speech Language Pathology Daily Session Note  Patient Details  Name: Cindy [name] MRN: [id number] Date of Birth: November 03, 1960  Today's Date: 09/19/2020 SLP Individual Time: 0730-0800 SLP Individual Time Calculation (min): 30 min  Short Term Goals: Week 3: SLP Short Term Goal 1 (Week 3): Patient will focus attention to verbal or tactile stimuli in 25% of opportunities with Max A multimodal cues. SLP Short Term Goal 2 (Week 3): Patient will respond to basic yes/no questions in 25% of opportunities with Max A multimodal cues. SLP Short Term Goal 3 (Week 3): Patient will follow 1-step commands in 25% of opportunities with Max A multimodal cues. SLP Short Term Goal 4 (Week 3): Patient will be ~50% intelligibile at the word level with Max A multimodal cues. SLP Short Term Goal 5 (Week 3): Patient will consume current diet of Dys. 1 textures with thin liquids with minimal overt s/s of aspiration and Max A multimodal cues for use of swallowing compensatory strategies.  Skilled Therapeutic Interventions:   Skilled SLP intervention focused communication and dysphagia. Pt appeared very agitated when SLP entered room and yelling. She initially refused SLP treatment but later agreed to breakfast. Pt responded on 1/10 simple questions in context regarding breakfast and pain. Decreased agitation and improved participation with music played from phone. She consumed 75% of dys 1 meal and tihn liquids and tolerated with  No overt s/sx of aspiration or penetration.  Pt continues to require max a to answer simple yes/no questions and follow simple commands. Cont with therapy per plan of care.  Pain Pain Assessment Pain Scale: Faces Faces Pain Scale: No hurt  Therapy/Group: Individual Therapy  Cindy [name] A Cindy [name] 09/19/2020, 8:02 AM

## 2020-09-20 ENCOUNTER — Other Ambulatory Visit: Payer: Self-pay

## 2020-09-20 NOTE — Progress Notes (Signed)
Occupational Therapy Session Note  Patient Details  Name: Cindy [name] MRN: [id number] Date of Birth: Oct 09, 1960  Today's Date: 09/20/2020 OT Individual Time: 1015-1045 OT Individual Time Calculation (min): 30 min    Short Term Goals: Week 3:  OT Short Term Goal 1 (Week 3): Pt will self feed wiht MOD HOH A 25% of meal OT Short Term Goal 2 (Week 3): Pt will tolerate OOB actiivty 1x per day OT Short Term Goal 3 (Week 3): Pt will roll withMOD A and no agitation in prep for self care  Skilled Therapeutic Interventions/Progress Updates:    Patient greeted semi-reclined in bed asleep. PT as +2 for therapy session. Changes to environment including turning on lights and provided verbal and tactile cues to achieve alertness. Pt able to wake and started advancing LEs towards EOB. Max A to get to sitting EOB. Pt with poor global body awareness and needed verba and tactile cues to get feet on the floor. Pt also with very poor head/neck control. Squat-pivot transfer with improved power up and mod A +2. Pt brought to the sink and tried to get her to achieve more head extension by looking in the mirror but pt would not. OT handed pt wash cloth and required hand over hand A to bring wash cloth to face. Noted pt had been incontinent of urine. Max +2 sit<>stand at the sink for total A peri-care and brief change in standing. It took 2 sit<>standing trials to get clothing management completed as well. Functional ambulation using 3 muskateers technique and sitting balance on therapy mat- See PT note for further details. Pt returned to room and needed max+2 squat-pivot back to bed. Pt left semi-reclined in bed with bed alarm on and needs met.   Therapy Documentation Precautions:  Precautions Precautions: Fall Precaution Comments: bilateral mitts and bedrails padded Restrictions Weight Bearing Restrictions: No Pain: Pain Assessment Pain Scale: 0-10 Pain Score: 0-No pain   Therapy/Group: Individual  Therapy  TEEGAN GUINTHER 09/20/2020, 10:47 AM

## 2020-09-20 NOTE — Discharge Summary (Signed)
Physician Discharge Summary  Patient ID: Cindy [name] MRN: [id number] DOB/AGE: Aug 09, 1960 60 y.o.  Admit date: 08/31/2020 Discharge date: 10/27/2020  Discharge Diagnoses:  Principal Problem:   Acute metabolic encephalopathy Active Problems:   Dysphagia   Agitation DVT prophylaxis Seizure disorder Hypertension History of bilateral cerebral convexity infarction frontal region Hyperlipidemia History of polysubstance abuse AKI Dysphagia  Discharged Condition: Stable  Significant Diagnostic Studies: No results found.  Labs:  Basic Metabolic Panel: Recent Labs  Lab 10/22/20 0745  NA 138  K 4.3  CL 105  CO2 25  GLUCOSE 104*  BUN 37*  CREATININE 1.18*  CALCIUM 9.0    CBC: No results for input(s): WBC, NEUTROABS, HGB, HCT, MCV, PLT in the last 168 hours.  CBG: No results for input(s): GLUCAP in the last 168 hours.  Family history.  Father with prostate cancer.  Mother with hypertension and asthma.  Denies any colon cancer esophageal cancer or rectal cancer  Brief HPI:   Cindy [name] is a 60 y.o. right-handed female with history of hypertension, hyperlipidemia, bilateral cerebral convexity infarction frontal region receiving inpatient rehab services April 2021 and maintained on Plavix, 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  Patient did have admissions 07/2019 for seizure as well as 01/2020.  Per chart review independent with assistive device prior to admission living with spouse.  Presented 08/23/2020 with altered mental status slurred speech right-sided weakness as well as reports of shaking and jerking episodes lasting 5 to 10 minutes.  Cranial CT/MRI scan negative for acute changes but did show remote lacunar infarct in the bilateral centrum semiovale, corona radiata and right thalamus.  CT angiogram head and neck no large vessel occlusion or significant stenosis.  CT cervical spine no acute cervical  spine fracture.  Admission chemistries alcohol negative WBC 11,800 glucose 127 creatinine 1.21 lactic acid 2.0 urine drug screen positive cocaine, CK 1002, ammonia 13.  EEG suggestive of cortical dysfunction arising from the left hemisphere likely secondary to underlying structural abnormality post ictal state no seizure or definitive discharges noted.  Neurology follow-up currently maintained on Vimpat as well as valproic acid.  Patient with intermittent bouts of agitation restlessness maintained on Seroquel.  Subcutaneous Lovenox for DVT prophylaxis.  Urine drug screen 40,000 multiple species completed empiric course of Keflex.  Follow-up CK improved 534.  Therapy evaluations completed due to patient decreased functional mobility was admitted for a comprehensive rehab program.   Hospital Course: Cindy [name] was admitted to rehab 08/31/2020 for inpatient therapies to consist of PT, ST and OT at least three hours five days a week. Past admission physiatrist, therapy team and rehab RN have worked together to provide customized collaborative inpatient rehab.  Pertaining to patient's metabolic encephalopathy status post epilepticus as well as history of bilateral cerebral convexity infarction remained stable slow progressive gains while in therapies she continued on full dose aspirin therapy.  Follow-up neurology services.  Subcutaneous Lovenox for DVT prophylaxis.  Mood stabilization psychoses maintained on Zyprexa via.  Melatonin helped to aid in her sleep.  Seizure prophylaxis with Vimpat as well as Depakote as directed.  Blood pressure control with Lopressor as well as Norvasc's.  Lipitor ongoing for hyperlipidemia.  She did have a history of polysubstance abuse urine drug screen positive cocaine it was discussed adamantly with patient family need for cessation of illicit drug use.  She was maintained on dysphagia #1 thin liquid diet.   Blood pressures were  monitored on TID basis and soft and  monitored     Rehab course: During patient's stay in rehab weekly team conferences were held to monitor patient's progress, set goals and discuss barriers to discharge. At admission, patient required max assist supine to sit and sit to supine +2 physical assist sit to stand she can wash her face with set up.  Physical exam.  Blood pressure 128/60 pulse 80 temperature 98 respirations 18 oxygen saturations 92% room air Constitutional.  Alert anxious bilateral mittens in place HEENT Head.  Normocephalic poor dentition Eyes.  Pupils round and reactive to light no discharge without nystagmus Neck.  Supple nontender no JVD without thyromegaly Cardiac regular rate rhythm without extra sounds or murmur heard Abdomen.  Soft nontender positive bowel sounds without rebound Respiratory effort normal no respiratory distress without wheeze Skin.  Intact Neurologic.  Makes eye contact with examiner easily distracted speech was low tone she does provide her name and place.  Peers to have 4/5 strength throughout inconsistent following commands  He/She  has had improvement in activity tolerance, balance, postural control as well as ability to compensate for deficits. He/She has had improvement in functional use RUE/LUE  and RLE/LLE as well as improvement in awareness.  Patient with overall slow inconsistent progress due to decreased awareness.  Requires max assist for bed mobility and transfers and inconsistently ambulate 10-100 feet with max assist of 2.  Continues demonstrate deficits in muscle weakness decreased cardiorespiratory endurance impaired timing sequencing decreased coordination and decreased motor planning.  Patient was assisted in changing brief at bed level incontinent of urine routine toileting provided required total assist for PERI hygiene.  Transferred to edge of bed total assist.  Again patient did remain inconsistent with her therapies and level of mobility.  Speech therapy continue to work  with patient and family on her dysphagia and risks of aspiration.  Patient continues to require max assist to answer simple yes/no questions and follow commands.  It was discussed at length with husband the need for ongoing family education initial concern needs for skilled nursing facility placement however insurance denied and changed to discharged home 10/27/2020.  Patient would not be able to receive home health therapies again due to her insurance issues.       Disposition: Discharged to home    Diet: Dysphagia #1 thin liquids  Special Instructions: No driving smoking or alcohol  Medications at discharge 1.  Tylenol as needed 2.  Norvasc 10 mg p.o. daily 3.  Aspirin 325 mg p.o. daily 4.  Lipitor 40 mg p.o. nightly 5.  Depakote 500 mg p.o. every 8 hours 6.  Folic acid 1 mg p.o. daily 7.  Vimpat 200 mg p.o. twice daily 8.  Melatonin 3 mg p.o. nightly 9.  Lopressor 25 mg p.o. twice daily 10.  Multivitamin daily 11.  Zyprexa  2.5 mg daily and 5 mg nightly 12.  Protonix 40 mg p.o. daily  30-35 minutes were spent completing discharge summary and discharge planning  Discharge Instructions     Ambulatory referral to Neurology   Complete by: As directed    An appointment is requested in approximately 4 weeks acute metabolic encephalopathy        Follow-up Information     Ranelle Oyster, MD Follow up.   Specialty: Physical Medicine and Rehabilitation Why: Office to call for appointment Contact information: [address] [place] [address] [place] [address] [phone]  Signed: Mcarthur Rossetti Sharonica Kraszewski 10/26/2020, 5:30 AM

## 2020-09-20 NOTE — Progress Notes (Signed)
PROGRESS NOTE   Subjective/Complaints: Pt restless in bed. No reported problems overnight.   ROS: Limited due to cognitive/behavioral    Objective:   No results found. No results for input(s): WBC, HGB, HCT, PLT in the last 72 hours. No results for input(s): NA, K, CL, CO2, GLUCOSE, BUN, CREATININE, CALCIUM in the last 72 hours.  Intake/Output Summary (Last 24 hours) at 09/20/2020 1045 Last data filed at 09/20/2020 0914 Gross per 24 hour  Intake 480 ml  Output --  Net 480 ml        Physical Exam: Vital Signs Blood pressure 92/69, pulse 74, temperature 98.1 F (36.7 C), resp. rate 18, height 5\' 7"  (1.702 m), weight 55.8 kg, SpO2 96 %.  Constitutional: No distress . Vital signs reviewed. HEENT: EOMI, oral membranes moist Neck: supple Cardiovascular: RRR without murmur. No JVD    Respiratory/Chest: CTA Bilaterally without wheezes or rales. Normal effort    GI/Abdomen: BS +, non-tender, non-distended Ext: no clubbing, cyanosis, or edema Psych: confused.  Neuro:  moves all 4's, confused. Dysarthric but sometimes intelligible  Assessment/Plan: 1. Functional deficits which require 3+ hours per day of interdisciplinary therapy in a comprehensive inpatient rehab setting.  Physiatrist is providing close team supervision and 24 hour management of active medical problems listed below.  Physiatrist and rehab team continue to assess barriers to discharge/monitor patient progress toward functional and medical goals  Care Tool:  Bathing  Bathing activity did not occur: Safety/medical concerns     Body parts bathed by helper: Right arm,Left arm,Chest,Abdomen,Front perineal area,Buttocks,Right upper leg,Left upper leg,Left lower leg,Right lower leg,Face     Bathing assist Assist Level: Total Assistance - Patient < 25%     Upper Body Dressing/Undressing Upper body dressing Upper body dressing/undressing activity did not  occur (including orthotics): Safety/medical concerns What is the patient wearing?: Pull over shirt    Upper body assist Assist Level: Total Assistance - Patient < 25%    Lower Body Dressing/Undressing Lower body dressing      What is the patient wearing?: Incontinence brief,Pants     Lower body assist Assist for lower body dressing: Total Assistance - Patient < 25%     Toileting Toileting Toileting Activity did not occur and hygiene only): N/A (no void or bm)  Toileting assist Assist for toileting: 2 Helpers     Transfers Chair/bed transfer  Transfers assist  Chair/bed transfer activity did not occur: Safety/medical concerns  Chair/bed transfer assist level: Total Assistance - Patient < 25%     Locomotion Ambulation   Ambulation assist   Ambulation activity did not occur: Safety/medical concerns  Assist level: 2 helpers Assistive device: Hand held assist Max distance: 116 ft   Walk 10 feet activity   Assist  Walk 10 feet activity did not occur: Safety/medical concerns  Assist level: 2 helpers Assistive device: Hand held assist   Walk 50 feet activity   Assist Walk 50 feet with 2 turns activity did not occur: Safety/medical concerns  Assist level: 2 helpers Assistive device: Hand held assist    Walk 150 feet activity   Assist Walk 150 feet activity did not occur: Safety/medical concerns  Walk 10 feet on uneven surface  activity   Assist Walk 10 feet on uneven surfaces activity did not occur: Safety/medical concerns         Wheelchair     Assist Will patient use wheelchair at discharge?: Yes (pt may benefit from [facility] assessment however unsafe to attempt this AM; per PT long term goals)   Wheelchair activity did not occur: Safety/medical concerns         Wheelchair 50 feet with 2 turns activity    Assist    Wheelchair 50 feet with 2 turns activity did not occur: Safety/medical concerns        Wheelchair 150 feet activity     Assist  Wheelchair 150 feet activity did not occur: Safety/medical concerns       Blood pressure 92/69, pulse 74, temperature 98.1 F (36.7 C), resp. rate 18, height 5\' 7"  (1.702 m), weight 55.8 kg, SpO2 96 %.  Medical Problem List and Plan: 1.  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             -patient may shower             -ELOS/Goals: mod A 2-3 weeks+             -Continue CIR therapies including PT, OT, and SLP --team conference today  - Advanced WM changes on MRI 4/12 likely related to SVD. Pt + for cocaine on admit.    -family ed? 2.  Impaired mobility -DVT/anticoagulation: Continue Lovenox             -antiplatelet therapy: Aspirin 325 mg 3. Pain Management: Continue Tylenol as needed 4. Agitation/psychosis: Antipsychotic: Continue zyprexa 5mg  hs and 2.5mg  daily  - Continue bilateral mittens as needed  -depakote 750mg  tid   -level was 94 on Monday   -reduced to 500mg  bid  -melatonin 3mg  HS  -5/5 still restless, confused. Not acutely agitated    5. Neuropsych: This patient is capable of making decisions on her own behalf. 6. Skin/Wound Care: Routine skin checks 7. Fluids/Electrolytes/Nutrition:     Filed Weights   09/18/20 0413 09/19/20 0320 09/20/20 0305  Weight: 55.6 kg 55.9 kg 55.8 kg    5/2 BUN/Cr 25/1.16  -stopped IVF at [facility]    -recheck bmet 5/6 8.  Seizure disorder.  Vimpat 200 mg twice daily, valproic acid 1000 mg twice daily.  EEG negative 9.  Hypertension.  Norvasc 10 mg daily     5/5 bp soft this morning but otherwise reasonable.  continue lopressor 25mg  bid 10.  History of bilateral cerebral convexity infarction frontal region.  CIR 08/19/2019-08/27/2019 11.  Hyperlipidemia.  Continue Lipitor 12.  History of polysubstance abuse.  Urine drug screen positive cocaine.   13. AKI: creatinine normal on 4/14. Some  elevation d/t prerenal  14. UTI: UCX never performed.     -f/u urine cx negative 15. Dysphagia:     -4/19 diet downgraded to D1, thins   -intake inconsistent, needs full supervision LOS: 20 days A FACE TO FACE EVALUATION WAS PERFORMED  [facility] 09/20/2020, 10:45 AM

## 2020-09-20 NOTE — Progress Notes (Signed)
Patient ID: Cindy [name], female   DOB: 25-Nov-1960, 60 y.o.   MRN: [id number]  Covering for primary SW, Auria.   Sw made attempt to call pt spouse to confirm he will attend family education. No answer, unable to leave VM due to not being set up  [place], [place] [phone]

## 2020-09-20 NOTE — Progress Notes (Signed)
Patient ID: Cindy [name], female   DOB: 1961-01-03, 60 y.o.   MRN: [id number]   Covering for Primary SW, Auria  SW met with pt and spouse in pt's room. SW ensured pt and spouse goal was to return home. Spouse reports he does not want pt to d/c to SNF.  SW informed pt spouse for pt to d/c home safely he would need to be in attendance for family education. Pt spouse stated he understood and agreed to attend pt sessions on tomorrow 5/6. Spouse will arrive to facility at [facility] and join in already scheduled sessions.   No updates on pt DME. Currently pt potentially will require a custom WC (per PT). Pt scheduled for d/c on Saturday. SW will continue to follow for updates.   [place], [place]

## 2020-09-20 NOTE — Progress Notes (Signed)
Occupational Therapy Session Note  Patient Details  Name: Cindy [name] MRN: [id number] Date of Birth: 09-02-1960  Today's Date: 09/20/2020 OT Missed Time: 24 Minutes Missed Time Reason: Patient fatigue   Short Term Goals: Week 1:  OT Short Term Goal 1 (Week 1): Pt will demonstrate improved attention to grooming task for >1 min with mod A. OT Short Term Goal 1 - Progress (Week 1): Not met OT Short Term Goal 2 (Week 1): Pt will tolerate sitting EOB with mod A for >2 min. OT Short Term Goal 2 - Progress (Week 1): Met OT Short Term Goal 3 (Week 1): Pt will don shirt max A. OT Short Term Goal 3 - Progress (Week 1): Not met OT Short Term Goal 4 (Week 1): Pt will self-feed 25% of meal with mod A. OT Short Term Goal 4 - Progress (Week 1): Not met  Skilled Therapeutic Interventions/Progress Updates:    Pt received asleep semi-reclined in bed. Attempted to see for scheduled OT session. Unable to rouse pt despite max VC/TCs. Will follow up as able.     Volanda Napoleon MS, OTR/L  09/20/2020, 6:52 AM

## 2020-09-20 NOTE — Progress Notes (Signed)
Physical Therapy Session Note  Patient Details  Name: Cindy [name] MRN: [id number] Date of Birth: [birth year]  Today's Date: 09/20/2020 PT Co-Treatment Time: 1000-1045 PT Co-Treatment Time Calculation (min): 45 min  Short Term Goals: Week 2:  PT Short Term Goal 1 (Week 2): Patient will perform bed mobility with mod A >50% of the time. PT Short Term Goal 1 - Progress (Week 2): Progressing toward goal PT Short Term Goal 2 (Week 2): Patient will perform bed>chair transfers with max A consitently. PT Short Term Goal 2 - Progress (Week 2): Progressing toward goal PT Short Term Goal 3 (Week 2): Patient will perform standing balance >1 min with mod-max A. PT Short Term Goal 3 - Progress (Week 2): Met PT Short Term Goal 4 (Week 2): Patient will ambulate >25 feet with +2 assist. PT Short Term Goal 4 - Progress (Week 2): Met Week 3:  PT Short Term Goal 1 (Week 3): STG=LTG due to ELOS.  Skilled Therapeutic Interventions/Progress Updates:    Received pt supine in bed asleep, OT as +2 for therapy session. Required max verbal and tactile cues to arouse. Performed bed mobility with max A x 2 trials throughout session. Pt performed x3 squat<>pivot transfers throughout session with mod/max +2. Pt demonstrated poor head/neck control and body awareness reaching out to grab onto anything within reach throughout session. Donned pants and changed brief standing at sink with +2 assist (see OT note for details). Pt transported to dayroom in TIS WC total A and ambulated 67f with 3 musketeer assist with +3 for close WC follow. Pt demonstrated cervical flexion, downward gaze, scissoring/ataxia, and decreased trunk control requiring cues for stepping sequence and to "look up out the windows" however pt unable to follow cues. Worked on dynamic sitting balance sitting EOM with mod A with emphasis on arousal, attention, and trunk control/stabilization. Pt able to reach out and hold onto therapist's hands but unable to make eye  contact with therapist. Returned to room and transferred back to bed. Concluded session with pt supine in bed, needs within reach, and bed alarm on. Posey belt donned and bilateral mitts applied.   Therapy Documentation Precautions:  Precautions Precautions: Fall Precaution Comments: bilateral mitts and bedrails padded Restrictions Weight Bearing Restrictions: No  Therapy/Group: Co-Treatment AAlfonse AlpersPT, DPT   09/20/2020, 12:18 PM

## 2020-09-20 NOTE — Progress Notes (Signed)
Speech Language Pathology Weekly Progress and Session Note  Patient Details  Name: Cindy [name] MRN: [id number] Date of Birth: 1961/03/19  Beginning of progress report period: September 13, 2020 End of progress report period: Sep 20, 2020  Today's Date: 09/20/2020 SLP Individual Time: 3295-1884 SLP Individual Time Calculation (min): 25 min  Short Term Goals: Week 3: SLP Short Term Goal 1 (Week 3): Patient will focus attention to verbal or tactile stimuli in 25% of opportunities with Max A multimodal cues. SLP Short Term Goal 1 - Progress (Week 3): Not met SLP Short Term Goal 2 (Week 3): Patient will respond to basic yes/no questions in 25% of opportunities with Max A multimodal cues. SLP Short Term Goal 2 - Progress (Week 3): Not met SLP Short Term Goal 3 (Week 3): Patient will follow 1-step commands in 25% of opportunities with Max A multimodal cues. SLP Short Term Goal 3 - Progress (Week 3): Not met SLP Short Term Goal 4 (Week 3): Patient will be ~50% intelligibile at the word level with Max A multimodal cues. SLP Short Term Goal 4 - Progress (Week 3): Not met SLP Short Term Goal 5 (Week 3): Patient will consume current diet of Dys. 1 textures with thin liquids with minimal overt s/s of aspiration and Max A multimodal cues for use of swallowing compensatory strategies. SLP Short Term Goal 5 - Progress (Week 3): Met    New Short Term Goals: Week 4: SLP Short Term Goal 1 (Week 4): Patient will focus attention to verbal or tactile stimuli in 25% of opportunities with Max A multimodal cues. SLP Short Term Goal 2 (Week 4): Patient will respond to basic yes/no questions in 25% of opportunities with Max A multimodal cues. SLP Short Term Goal 3 (Week 4): Patient will follow 1-step commands in 25% of opportunities with Max A multimodal cues. SLP Short Term Goal 4 (Week 4): Patient will be ~50% intelligibile at the word level with Max A multimodal cues. SLP Short Term Goal 5 (Week 4): Patient will  consume current diet of Dys. 1 textures with thin liquids with minimal overt s/s of aspiration and Mod A multimodal cues for use of swallowing compensatory strategies.  Weekly Progress Updates: Patient continues to make minimal gains and has met 1 of 5 STGs this reporting period. Currently, patient appears to fluctuate between agitated and lethargic with decreased ability to maintain arousal which makes it extremely difficult for patient to participate in therapy effectively. When patient is alert, she is unable to focus her attention to most tasks and requires total A to follow commands. Patient's speech is essentially unintelligible and patient is confused and often hallucinating. Patient is consuming Dys. 1 textures with thin liquids with minimal overt s/s of aspiration with increased PO intake this reporting period. Suspect patient's husband is unable to provide the level of cognitive and physical assistance needed at this time, therefore, discharge recommendations are changed to SNF.  Family education ongoing. Patient would benefit from continued skilled SLP intervention to maximize her cognitive, swallowing and speech function prior to discharge.          Intensity: Minumum of 1-2 x/day, 30 to 90 minutes Frequency: 3 to 5 out of 7 days Duration/Length of Stay: TBD due to likely SNF placement Treatment/Interventions: Cognitive remediation/compensation;Speech/Language facilitation;Therapeutic Activities;Therapeutic Exercise;Dysphagia/aspiration precaution training;Patient/family education;Environmental controls;Cueing hierarchy;Functional tasks   Daily Session  Skilled Therapeutic Interventions: Skilled treatment session focused on dysphagia and cognitive goals. Upon arrival, patient was asleep in bed but easily awakened.  Patient agreeable to breakfast meal and consumed ~25% of her meal of Dys. 1 textures with thin liquids. Patient demonstrated prolonged AP transit and suspected delayed [name]  initiation with 2 overt coughing episodes, suspect due to attempting to talk with a full oral cavity. Patient appeared more attentive to tasks today and sustained attention to meal for ~15 minutes. However, by end of meal, patient verbalizing she did not want anymore and attempting to bite off mitts. Patient also demonstrated increased responsiveness to questions in about 50% of opportunities with increased intelligibility at the phrase level. Patient left upright in bed with alarm on and all needs within reach. Continue with current plan of care.   Pain No/Denies pain   Therapy/Group: Individual Therapy  Cindy [name] 09/20/2020, 6:25 AM

## 2020-09-21 LAB — BASIC METABOLIC PANEL
Anion gap: 8 (ref 5–15)
BUN: 31 mg/dL — ABNORMAL HIGH (ref 8–23)
CO2: 26 mmol/L (ref 22–32)
Calcium: 9.5 mg/dL (ref 8.9–10.3)
Chloride: 101 mmol/L (ref 98–111)
Creatinine, Ser: 1.23 mg/dL — ABNORMAL HIGH (ref 0.44–1.00)
GFR, Estimated: 50 mL/min — ABNORMAL LOW (ref >60)
Glucose, Bld: 115 mg/dL — ABNORMAL HIGH (ref 70–99)
Potassium: 4.1 mmol/L (ref 3.5–5.1)
Sodium: 135 mmol/L (ref 135–145)

## 2020-09-21 MED ORDER — DIVALPROEX SODIUM 125 MG PO CSDR: 500.0000 mg | DELAYED_RELEASE_CAPSULE | Freq: Three times a day (TID) | ORAL | 0 refills | Status: DC

## 2020-09-21 MED ORDER — FOLIC ACID 1 MG PO TABS: 1.0000 mg | ORAL_TABLET | Freq: Every day | ORAL | 1 refills | Status: DC

## 2020-09-21 MED ORDER — LACOSAMIDE 200 MG PO TABS: 200.0000 mg | ORAL_TABLET | Freq: Two times a day (BID) | ORAL | 1 refills | Status: DC

## 2020-09-21 MED ORDER — MELATONIN 3 MG PO TABS: 3.0000 mg | ORAL_TABLET | Freq: Every day | ORAL | 0 refills | Status: DC

## 2020-09-21 MED ORDER — OLANZAPINE 2.5 MG PO TABS: ORAL_TABLET | ORAL | 0 refills | Status: DC

## 2020-09-21 MED ORDER — AMLODIPINE BESYLATE 10 MG PO TABS: ORAL_TABLET | ORAL | 1 refills | Status: DC

## 2020-09-21 MED ORDER — ACETAMINOPHEN 325 MG PO TABS: 650.0000 mg | ORAL_TABLET | Freq: Four times a day (QID) | ORAL | Status: AC | PRN

## 2020-09-21 MED ORDER — ATORVASTATIN CALCIUM 40 MG PO TABS: 40.0000 mg | ORAL_TABLET | Freq: Every day | ORAL | 1 refills | Status: DC

## 2020-09-21 MED ORDER — ASPIRIN 325 MG PO TABS: 325.0000 mg | ORAL_TABLET | Freq: Every day | ORAL | Status: AC

## 2020-09-21 MED ORDER — SODIUM CHLORIDE 0.45 % IV SOLN: INTRAVENOUS | Status: DC

## 2020-09-21 MED ORDER — METOPROLOL TARTRATE 25 MG PO TABS: 25.0000 mg | ORAL_TABLET | Freq: Two times a day (BID) | ORAL | 1 refills | Status: DC

## 2020-09-21 MED ORDER — PANTOPRAZOLE SODIUM 40 MG PO TBEC: 40.0000 mg | DELAYED_RELEASE_TABLET | Freq: Every day | ORAL | 1 refills | Status: DC

## 2020-09-21 NOTE — Progress Notes (Addendum)
PROGRESS NOTE   Subjective/Complaints: Pt up with PT. Actually fairly alert. Acknowledged me  ROS: Limited due to cognitive/behavioral   Objective:   No results found. No results for input(s): WBC, HGB, HCT, PLT in the last 72 hours. Recent Labs    09/21/20 0526  NA 135  K 4.1  CL 101  CO2 26  GLUCOSE 115*  BUN 31*  CREATININE 1.23*  CALCIUM 9.5    Intake/Output Summary (Last 24 hours) at 09/21/2020 1037 Last data filed at 09/21/2020 0900 Gross per 24 hour  Intake 720 ml  Output 1 ml  Net 719 ml        Physical Exam: Vital Signs Blood pressure 121/85, pulse 79, temperature 98 F (36.7 C), temperature source Oral, resp. rate 20, height 5\' 7"  (1.702 m), weight 54.6 kg, SpO2 100 %.  Constitutional: No distress . Vital signs reviewed. HEENT: EOMI, oral membranes moist Neck: supple Cardiovascular: RRR without murmur. No JVD    Respiratory/Chest: CTA Bilaterally without wheezes or rales. Normal effort    GI/Abdomen: BS +, non-tender, non-distended Ext: no clubbing, cyanosis, or edema Psych: pleasant and cooperative  Neuro:  moves all 4's, confused. Dysarthric but sometimes intelligible  Assessment/Plan: 1. Functional deficits which require 3+ hours per day of interdisciplinary therapy in a comprehensive inpatient rehab setting.  Physiatrist is providing close team supervision and 24 hour management of active medical problems listed below.  Physiatrist and rehab team continue to assess barriers to discharge/monitor patient progress toward functional and medical goals  Care Tool:  Bathing  Bathing activity did not occur: Safety/medical concerns     Body parts bathed by helper: Right arm,Left arm,Chest,Abdomen,Front perineal area,Buttocks,Right upper leg,Left upper leg,Left lower leg,Right lower leg,Face     Bathing assist Assist Level: Total Assistance - Patient < 25%     Upper Body  Dressing/Undressing Upper body dressing Upper body dressing/undressing activity did not occur (including orthotics): Safety/medical concerns What is the patient wearing?: Pull over shirt    Upper body assist Assist Level: Total Assistance - Patient < 25%    Lower Body Dressing/Undressing Lower body dressing      What is the patient wearing?: Incontinence brief,Pants     Lower body assist Assist for lower body dressing: Total Assistance - Patient < 25%     Toileting Toileting Toileting Activity did not occur and hygiene only): N/A (no void or bm)  Toileting assist Assist for toileting: 2 Helpers     Transfers Chair/bed transfer  Transfers assist  Chair/bed transfer activity did not occur: Safety/medical concerns  Chair/bed transfer assist level: Maximal Assistance - Patient 25 - 49%     Locomotion Ambulation   Ambulation assist   Ambulation activity did not occur: Safety/medical concerns  Assist level: 2 helpers Assistive device: Hand held assist Max distance: 116 ft   Walk 10 feet activity   Assist  Walk 10 feet activity did not occur: Safety/medical concerns  Assist level: 2 helpers Assistive device: Hand held assist   Walk 50 feet activity   Assist Walk 50 feet with 2 turns activity did not occur: Safety/medical concerns  Assist level: 2 helpers Assistive device: Hand held  assist    Walk 150 feet activity   Assist Walk 150 feet activity did not occur: Safety/medical concerns         Walk 10 feet on uneven surface  activity   Assist Walk 10 feet on uneven surfaces activity did not occur: Safety/medical concerns         Wheelchair     Assist Will patient use wheelchair at discharge?: Yes (pt may benefit from [facility] assessment however unsafe to attempt this AM; per PT long term goals)   Wheelchair activity did not occur: Safety/medical concerns         Wheelchair 50 feet with 2 turns  activity    Assist    Wheelchair 50 feet with 2 turns activity did not occur: Safety/medical concerns       Wheelchair 150 feet activity     Assist  Wheelchair 150 feet activity did not occur: Safety/medical concerns       Blood pressure 121/85, pulse 79, temperature 98 F (36.7 C), temperature source Oral, resp. rate 20, height 5\' 7"  (1.702 m), weight 54.6 kg, SpO2 100 %.  Medical Problem List and Plan: 1.  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             -patient may shower             -husband has opted for SNF placement.              -Continue CIR therapies including PT, OT, and SLP -   - Advanced WM changes on MRI 4/12 likely related to SVD. Pt + for cocaine on admit.      2.  Impaired mobility -DVT/anticoagulation: Continue Lovenox             -antiplatelet therapy: Aspirin 325 mg 3. Pain Management: Continue Tylenol as needed 4. Agitation/psychosis: Antipsychotic: Continue zyprexa 5mg  hs and 2.5mg  daily  - Continue bilateral mittens as needed  -depakote 750mg  tid   -level was 94 on Monday   -reduced to 500mg  bid  -melatonin 3mg  HS  -5/6 behavior less agitated, more appropriate    5. Neuropsych: This patient is capable of making decisions on her own behalf. 6. Skin/Wound Care: Routine skin checks 7. Fluids/Electrolytes/Nutrition:     Filed Weights   09/19/20 0320 09/20/20 0305 09/21/20 0613  Weight: 55.9 kg 55.8 kg 54.6 kg    5/2 BUN/Cr 25/1.16-->31/1.23 5/6  -husband will need to [facility] FLU[facility] at home   -will resume 1/2ns IVF nightly  8.  Seizure disorder.  Vimpat 200 mg twice daily, valproic acid 1000 mg twice daily.  EEG negative 9.  Hypertension.  Norvasc 10 mg daily     5/6 bp reasonable.  continue lopressor 25mg  bid 10.  History of bilateral cerebral convexity infarction frontal region.  CIR 08/19/2019-08/27/2019 11.  Hyperlipidemia.  Continue  Lipitor 12.  History of polysubstance abuse.  Urine drug screen positive cocaine.   13. AKI: creatinine normal on 4/14. Some elevation d/t prerenal  14. UTI: UCX never performed.     -f/u urine cx negative 15. Dysphagia:     -4/19 diet downgraded to D1, thins   -intake inconsistent, needs full supervision LOS: 21 days A FACE TO FACE EVALUATION WAS PERFORMED  06-15-2001 09/21/2020, 10:37 AM

## 2020-09-21 NOTE — Progress Notes (Signed)
Occupational Therapy Session Note  Patient Details  Name: Cindy [name] MRN: [id number] Date of Birth: 1961-04-05  Today's Date: 09/21/2020 OT Individual Time: 0802-2336 OT Individual Time Calculation (min): 28 min    Short Term Goals: Week 2:  OT Short Term Goal 1 (Week 2): Pt will self feed 25% of meals wiht MOD HOH A OT Short Term Goal 1 - Progress (Week 2): Progressing toward goal OT Short Term Goal 2 (Week 2): Pt will focus attention to clinician when name is called 25% of trials OT Short Term Goal 2 - Progress (Week 2): Progressing toward goal OT Short Term Goal 3 (Week 2): Pt will complete 1 step of UB dressing wiht MOD HOH A OT Short Term Goal 3 - Progress (Week 2): Met OT Short Term Goal 4 (Week 2): Pt will tolerate OOB activity 1x daily OT Short Term Goal 4 - Progress (Week 2): Progressing toward goal  Skilled Therapeutic Interventions/Progress Updates:    1:1. Pt and husband present for session. Pt initially asleep and unarousable. OT spends time to educate husband on CLOF as well as medical complications difficult to manage at home. Pt husband reporting agreeing to sending to to SNF as OT demo level of A needed for mobility and ADLs when pt unarousable as well as when pt agitated. Pt requires total A to assume seated EOB position as well as initially maintain sitting balance at EOB with intermittent bouts of MIN A when pt more aroused. With OT LE behind pt, OT able to facilitate cervical extension as well as face washing with HOH A with cool cloth. Pt more aroused and unintelligibly speaking. Pt completes 2 sit to stand using 3 musketeers method with pt helping initiate seconds stand and step forward with RLE. Pt returned to supine with total A, unable to bring LEs up in bed after set chest onto bed. Exited session with pt seated in bed, exit alarm on and call light in reach   Therapy Documentation Precautions:  Precautions Precautions: Fall Precaution Comments: bilateral mitts  and bedrails padded Restrictions Weight Bearing Restrictions: No General:   Vital Signs: Therapy Vitals Temp: 98 F (36.7 C) Temp Source: Oral Pulse Rate: 79 Resp: 20 BP: 121/85 Patient Position (if appropriate): Lying Oxygen Therapy SpO2: 100 % O2 Device: Room Air Pain:   ADL: ADL Eating: Unable to assess Grooming: Unable to assess Upper Body Bathing: Unable to assess Lower Body Bathing: Unable to assess Upper Body Dressing: Unable to assess Lower Body Dressing: Dependent Where Assessed-Lower Body Dressing: Bed level Toileting: Unable to assess Toilet Transfer: Unable to assess Tub/Shower Transfer: Unable to assess Gaffer Transfer: Unable to assess Vision   Perception    Praxis   Exercises:   Other Treatments:     Therapy/Group: Individual Therapy  Tonny Branch 09/21/2020, 6:56 AM

## 2020-09-21 NOTE — Progress Notes (Addendum)
Patient ID: Cindy [name], female   DOB: 1961/04/24, 60 y.o.   MRN: [id number]  Per EMR, pt husband asked to come in for family edu for scheduled therapies today. Pt husband reporting he does not want to place her.   SW saw pt in therapy with PT and patient's husband [name] [phone]) had not arrived. SW called pt husband several times as he does not have voicemail. SW was able to reach him, and reminded him about family education today. SW discussed the importance of family education so he knows how to care for her. He reports that he is on his way to the hospital and will be here in 30 minutes.   *Pt husband arrived for family education. When SW arrived to pt room, PA speaking with husband. SW discussed the importance of family education and how we have no HHA or DME in place since he has been hard to make contact. Husband does not have a HHA preference.   SW submitted PCS referral to [employer] [phone]). *SW confirmed with Jocelyn/[facility] on pt d/c tomorrow, and waiting to complete expedited assessment with nursing.   SW completed CAP/DA referral with [place], Disability, [facility] [phone]).  SW submitted APS referral with [facility] [phone]) due to concerns related to  Inconsistency with coming in for family education, limited resources due to insurance, and to ensure husband is able to provide adequate care to pt due to high acuity of care needs.  *SW received phone call from [place] supervisor who wanted to confirm new d/c plan is to SNF. SW confirmed. States this information will be added to referral and will be screened out.   SW spoke with Kathy/Intake with [facility] [phone]) who is going to follow-up about referral to see if pt can be accepted.  *referral declined as no PT in area.   SW spoke with Sharon/Administrator with [facility] ([phone]/[phone]) to discuss  referral. States if able to accept, pt will only begin with SN/OT as no SLP, PT, or aide. States once staff is hired, and services become available, nursing will write order for services to begin.   *SW met with pt husband to discuss possible delay in discharge due to putting DME needs in place. SW discussed with pt husband again if he is able to provide care and began to discuss what DME items she will need. He agrees that SNF will be best. He is aware that SW will begin to work on placement, and likely pt may not been in [place].   Declined [facility] [place]- does not travel to [employer]- no [facility] declined [facility]- no staffing in area [place]- declined   SW spoke with [facility] APS to inform on new d/c plan for pt and asked information to be relayed to [place]. SW rescinded referral for review with [facility]. SW spoke with Holly/[facility] referral) to inform on pt new d/c plan to SNF.   SW spoke with admissions/[facility] [phone]) and waiting on follow-up about bed offer.  *SW received follow-up from [place] unable to accept Medicaid or LOG.   SW left a message for [name]/[name] ([phone]) to discuss referral and waiting on follow-up.   Loralee Pacas, MSW, [place] Office: [phone] Cell: [phone] Fax: [phone]

## 2020-09-21 NOTE — Progress Notes (Signed)
Physical Therapy Session Note  Patient Details  Name: Cindy [name] MRN: [id number] Date of Birth: 1960/09/11  Today's Date: 09/21/2020 PT Individual Time: 5462-7035 PT Individual Time Calculation (min): 43 min   Short Term Goals: Week 3:  PT Short Term Goal 1 (Week 3): STG=LTG due to ELOS.  Skilled Therapeutic Interventions/Progress Updates:     Pt received supine in bed asleep. PT rouses pt and pt performs supine to sit with totalA. Pt becomes slightly more alert upon sitting upright but still requiring modA for sitting balance. PT threads pants on each leg and pt performs sit to stand with mod/maxA and PT provides totalA to pull up pants. Stand step transfer to [facility] with maxA. WC transport to gym for time management. Pt performs sit to stand in parallel bars with modA. Pt then able to maintain standing with minA with backs of legs supported by WC. PT attempts to ambulate with pt but pt appears to be resistant (anxious?) to walking. WC transport back to room. MaxA for stand pivot back to bed and totalA for return to supine. Left with alarm intact and all needs within reach.  Therapy Documentation Precautions:  Precautions Precautions: Fall Precaution Comments: bilateral mitts and bedrails padded Restrictions Weight Bearing Restrictions: No   Therapy/Group: Individual Therapy  Beau Fanny, PT, DPT 09/21/2020, 4:22 PM

## 2020-09-22 NOTE — Progress Notes (Signed)
Occupational Therapy Weekly Progress Note  Patient Details  Name: Cindy [name] MRN: [id number] Date of Birth: November 21, 1960  Beginning of progress report period: September 14, 2020 End of progress report period: Sep 22, 2020  Today's Date: 09/22/2020 OT Individual Time: 0700-0730 OT Individual Time Calculation (min): 30 min    Patient has met 1 of 3 short term goals.  Pt continues to make slow progress towards goals. Pts husband finally attended a therapy sesion to see CLOF and agreeable to SNF d/c as pt is total-+2 level of care for all ADLs and mobility. Pt has improved tolerance ot tx and initated more automatic tasks at sink-hand washing/hair combing however profound cognitive/perceptual deficits continue to be limiting performance in ADLs.  Patient continues to demonstrate the following deficits: muscle weakness, decreased cardiorespiratoy endurance, impaired timing and sequencing, abnormal tone, unbalanced muscle activation, motor apraxia, decreased coordination and decreased motor planning, decreased visual acuity and decreased visual perceptual skills, decreased motor planning, decreased initiation, decreased attention, decreased awareness, decreased problem solving, decreased safety awareness, decreased memory and delayed processing and decreased sitting balance, decreased standing balance, decreased postural control and decreased balance strategies and therefore will continue to benefit from skilled OT intervention to enhance overall performance with BADL and iADL.  Patient progressing toward long term goals..  Continue plan of care.  OT Short Term Goals Week 3:  OT Short Term Goal 1 (Week 3): Pt will self feed wiht MOD HOH A 25% of meal OT Short Term Goal 1 - Progress (Week 3): Progressing toward goal OT Short Term Goal 2 (Week 3): Pt will tolerate OOB actiivty 1x per day OT Short Term Goal 2 - Progress (Week 3): Progressing toward goal OT Short Term Goal 3 (Week 3): Pt will roll withMOD A  and no agitation in prep for self care OT Short Term Goal 3 - Progress (Week 3): Met Week 4:  OT Short Term Goal 1 (Week 4): Pt will self feed 25% of meal wiht MOD HOH A OT Short Term Goal 2 (Week 4): Pt will intiate automatic task wiht mod multimodal cuing at sink OT Short Term Goal 3 (Week 4): Pt iwll tolerate OOB activity 1x per day.  Skilled Therapeutic Interventions/Progress Updates:    1:1. Pt iniitally asleep upon arrival. Yesterday husbandverbally consented 2x to allow OT to cut out large mat of hair in back of head. OT cut hair to allow close view of skin on scalp  with no breakdown evident but large dandriff flakes present. Pt aroused during hair cut and informed of husbands consent. Pt stating "goodmorning" and repeating "why hospital." Orientation provided. Shower cap used for washing hair with HOH A. Pt initiated hair combing, but required HOH A d/t poor motor control/shaking of UE. Exited session with pt seated in bed, exit alarm on and call light in reach   Therapy Documentation Precautions:  Precautions Precautions: Fall Precaution Comments: bilateral mitts and bedrails padded Restrictions Weight Bearing Restrictions: No General:   Vital Signs: Therapy Vitals Temp: 97.8 F (36.6 C) Pulse Rate: 77 Resp: (!) 22 BP: (!) 128/96 Patient Position (if appropriate): Lying Oxygen Therapy SpO2: 98 % O2 Device: Room Air Pain:   ADL: ADL Eating: Unable to assess Grooming: Unable to assess Upper Body Bathing: Unable to assess Lower Body Bathing: Unable to assess Upper Body Dressing: Unable to assess Lower Body Dressing: Dependent Where Assessed-Lower Body Dressing: Bed level Toileting: Unable to assess Toilet Transfer: Unable to assess Tub/Shower Transfer: Unable to assess [employer]  Transfer: Unable to assess Vision   Perception    Praxis   Exercises:   Other Treatments:     Therapy/Group: Individual Therapy  Tonny Branch 09/22/2020, 6:44 AM

## 2020-09-22 NOTE — Progress Notes (Signed)
Speech Language Pathology Daily Session Note  Patient Details  Name: NIANG MITCHELTREE MRN: [id number] Date of Birth: 03-05-1961  Today's Date: 09/22/2020 SLP Individual Time: 3428-7681 SLP Individual Time Calculation (min): 40 min  Short Term Goals: Week 4: SLP Short Term Goal 1 (Week 4): Patient will focus attention to verbal or tactile stimuli in 25% of opportunities with Max A multimodal cues. SLP Short Term Goal 2 (Week 4): Patient will respond to basic yes/no questions in 25% of opportunities with Max A multimodal cues. SLP Short Term Goal 3 (Week 4): Patient will follow 1-step commands in 25% of opportunities with Max A multimodal cues. SLP Short Term Goal 4 (Week 4): Patient will be ~50% intelligibile at the word level with Max A multimodal cues. SLP Short Term Goal 5 (Week 4): Patient will consume current diet of Dys. 1 textures with thin liquids with minimal overt s/s of aspiration and Mod A multimodal cues for use of swallowing compensatory strategies.  Skilled Therapeutic Interventions: Pt seen for skilled ST with focus on dysphagia and cognitive goals. Upon arrival, pt asleep and requiring multimodal cues to attempt to wake. Husband present during initial portion of session and assisting as well, pt remaining quite lethargic throughout treatment. Spoke with husband about SNF recommendations as well as ongoing diet recommendation and swallow precautions at this time. Pt consuming few bites of puree solids and sips of thin liquid requiring total A for self feeding, moderately delayed A-P transit and decreased bolus awareness likely due to lethargy. Pt with 1 episode immediate cough with thin liquids, no change in vocal quality noted. SLP facilitating basic yes/no questions related to wants and needs by providing max A cues, pt with response on 25% of opportunities. Pt left in bed upright with alarm set and all needs within reach. Cont ST POC.   Pain Pain Assessment Pain Scale: Faces Faces  Pain Scale: No hurt  Therapy/Group: Individual Therapy  Tacey Ruiz 09/22/2020, 11:02 AM

## 2020-09-22 NOTE — Progress Notes (Signed)
PROGRESS NOTE   Subjective/Complaints:  Pt laying in bed- asking to get mittens off- denies pain- doesn't know when LBM was.  Usually doesn't make sense, but did occasionally.   ROS: limited by behavior/cognition  Objective:   No results found. No results for input(s): WBC, HGB, HCT, PLT in the last 72 hours. Recent Labs    09/21/20 0526  NA 135  K 4.1  CL 101  CO2 26  GLUCOSE 115*  BUN 31*  CREATININE 1.23*  CALCIUM 9.5    Intake/Output Summary (Last 24 hours) at 09/22/2020 1252 Last data filed at 09/22/2020 1235 Gross per 24 hour  Intake 1153.23 ml  Output --  Net 1153.23 ml        Physical Exam: Vital Signs Blood pressure 130/82, pulse 72, temperature 98 F (36.7 C), temperature source Oral, resp. rate 17, height 5\' 7"  (1.702 m), weight 54.8 kg, SpO2 100 %.    General: awake, slightly more alert; in mittens and waist belt, NAD HENT: conjugate gaze; oropharynx dry CV: regular rate; no JVD Pulmonary: CTA B/L; no W/R/R- good air movement GI: soft, NT, ND, (+)BS Psychiatric: confused; asking for mittens off Neurological: more alert  Ext: no clubbing, cyanosis, or edema Psych: pleasant and cooperative  Neuro:  moves all 4's, confused. Dysarthric but sometimes intelligible  Assessment/Plan: 1. Functional deficits which require 3+ hours per day of interdisciplinary therapy in a comprehensive inpatient rehab setting.  Physiatrist is providing close team supervision and 24 hour management of active medical problems listed below.  Physiatrist and rehab team continue to assess barriers to discharge/monitor patient progress toward functional and medical goals  Care Tool:  Bathing  Bathing activity did not occur: Safety/medical concerns     Body parts bathed by helper: Right arm,Left arm,Chest,Abdomen,Front perineal area,Buttocks,Right upper leg,Left upper leg,Left lower leg,Right lower leg,Face      Bathing assist Assist Level: Total Assistance - Patient < 25%     Upper Body Dressing/Undressing Upper body dressing Upper body dressing/undressing activity did not occur (including orthotics): Safety/medical concerns What is the patient wearing?: Pull over shirt    Upper body assist Assist Level: Total Assistance - Patient < 25%    Lower Body Dressing/Undressing Lower body dressing      What is the patient wearing?: Incontinence brief,Pants     Lower body assist Assist for lower body dressing: Total Assistance - Patient < 25%     Toileting Toileting Toileting Activity did not occur and hygiene only): N/A (no void or bm)  Toileting assist Assist for toileting: 2 Helpers     Transfers Chair/bed transfer  Transfers assist  Chair/bed transfer activity did not occur: Safety/medical concerns  Chair/bed transfer assist level: Maximal Assistance - Patient 25 - 49%     Locomotion Ambulation   Ambulation assist   Ambulation activity did not occur: Safety/medical concerns  Assist level: 2 helpers Assistive device: Hand held assist Max distance: 116 ft   Walk 10 feet activity   Assist  Walk 10 feet activity did not occur: Safety/medical concerns  Assist level: 2 helpers Assistive device: Hand held assist   Walk 50 feet activity   Assist Walk  50 feet with 2 turns activity did not occur: Safety/medical concerns  Assist level: 2 helpers Assistive device: Hand held assist    Walk 150 feet activity   Assist Walk 150 feet activity did not occur: Safety/medical concerns         Walk 10 feet on uneven surface  activity   Assist Walk 10 feet on uneven surfaces activity did not occur: Safety/medical concerns         Wheelchair     Assist Will patient use wheelchair at discharge?: Yes (pt may benefit from [facility] assessment however unsafe to attempt this AM; per PT long term goals)   Wheelchair activity did not occur:  Safety/medical concerns         Wheelchair 50 feet with 2 turns activity    Assist    Wheelchair 50 feet with 2 turns activity did not occur: Safety/medical concerns       Wheelchair 150 feet activity     Assist  Wheelchair 150 feet activity did not occur: Safety/medical concerns       Blood pressure 130/82, pulse 72, temperature 98 F (36.7 C), temperature source Oral, resp. rate 17, height 5\' 7"  (1.702 m), weight 54.8 kg, SpO2 100 %.  Medical Problem List and Plan: 1.  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             -patient may shower             -husband has opted for SNF placement.              -con't PT, OT and SLP- need mittens and safety waist belt due to fall risk. -   - Advanced WM changes on MRI 4/12 likely related to SVD. Pt + for cocaine on admit.   2.  Impaired mobility -DVT/anticoagulation: Continue Lovenox             -antiplatelet therapy: Aspirin 325 mg 3. Pain Management: Continue Tylenol as needed 4. Agitation/psychosis: Antipsychotic: Continue zyprexa 5mg  hs and 2.5mg  daily  - Continue bilateral mittens as needed  -depakote 750mg  tid   -level was 94 on Monday   -reduced to 500mg  bid  -melatonin 3mg  HS  -5/6 behavior less agitated, more appropriate  5/7- pt more alert today    5. Neuropsych: This patient is capable of making decisions on her own behalf. 6. Skin/Wound Care: Routine skin checks 7. Fluids/Electrolytes/Nutrition:     Filed Weights   09/20/20 0305 09/21/20 0613 09/22/20 0430  Weight: 55.8 kg 54.6 kg 54.8 kg    5/2 BUN/Cr 25/1.16-->31/1.23 5/6  -husband will need to [facility] FLUIDS at home   -will resume 1/2ns IVF nightly  8.  Seizure disorder.  Vimpat 200 mg twice daily, valproic acid 1000 mg twice daily.  EEG negative 9.  Hypertension.  Norvasc 10 mg daily     5/6 bp reasonable.  continue lopressor 25mg  bid  5/7- BP  controlled- con't regimen 10.  History of bilateral cerebral convexity infarction frontal region.  CIR 08/19/2019-08/27/2019 11.  Hyperlipidemia.  Continue Lipitor 12.  History of polysubstance abuse.  Urine drug screen positive cocaine.   13. AKI: creatinine normal on 4/14. Some elevation d/t prerenal  14. UTI: UCX never performed.     -f/u urine cx negative 15. Dysphagia:     -4/19 diet downgraded to D1, thins   -intake inconsistent, needs full supervision LOS: 22 days A FACE  TO FACE EVALUATION WAS PERFORMED  Vincen Bejar 09/22/2020, 12:52 PM

## 2020-09-23 NOTE — Plan of Care (Signed)
  Problem: RH BOWEL ELIMINATION Goal: RH STG MANAGE BOWEL WITH ASSISTANCE Description: STG Manage Bowel with min Assistance. Outcome: Not Progressing; incontinence   Problem: RH BLADDER ELIMINATION Goal: RH STG MANAGE BLADDER WITH ASSISTANCE Description: STG Manage Bladder With min Assistance Outcome: Not Progressing; incontinence   

## 2020-09-24 NOTE — Progress Notes (Signed)
Patient ID: Cindy [name], female   DOB: 1961-02-09, 60 y.o.   MRN: [id number]  SW saw pt husband [name] who inquired about SNF. SW informed waiting on updates from [facility]. He feels this location would be best. SW indicated will do the best with trying to get her into this location but cannot guarantee.   SW spoke with [name]/ Admisisons with [place] [facility] [phone]) to discuss referral and she reports pt would need to be approved by corporate office. She does not see a reason pt would not be accepted. States pt husband will need to come in to complete forms for long term Medicaid and pt can be accepted as pending for LT MCD. Husband can come in today at 3pm or 8am tomorrow. She intends to follow-up with husband as well.  *SW spoke with pt husband [name] [phone])  to inform him on 3pm appointment with Debbie/[name]. He states he will be present for appointment.   [name], MSW, LCSWA Office: [phone] Cell: [phone] Fax: [phone]

## 2020-09-24 NOTE — Progress Notes (Signed)
Physical Therapy Session Note  Patient Details  Name: JAHAIRA EARNHART MRN: [id number] Date of Birth: 31-Jul-1960  Attempted to initiate PT treatment. Husband at bedside. Pt sound asleep and requires max multimodal cues to awaken. Immediately falls back asleep. Per SLP, pt exhausted from morning shower with OT. Unable to get patient alert enough to adequately participate in PT, even with husband encouragement. Husband asking to speak with CSW regarding DC planning and SNF options. CSW notified via secure chat. Pt missed 30 minutes of skilled therapy due to fatigue.   Ramah Langhans P Chuong Casebeer PT 09/24/2020, 10:25 AM

## 2020-09-24 NOTE — Progress Notes (Signed)
PROGRESS NOTE   Subjective/Complaints: Pt up in chair. OT about to take her into the shower. C/o right leg pain  ROS: Limited due to cognitive/behavioral   Objective:   No results found. No results for input(s): WBC, HGB, HCT, PLT in the last 72 hours. No results for input(s): NA, K, CL, CO2, GLUCOSE, BUN, CREATININE, CALCIUM in the last 72 hours.  Intake/Output Summary (Last 24 hours) at 09/24/2020 1000 Last data filed at 09/24/2020 0850 Gross per 24 hour  Intake 320 ml  Output --  Net 320 ml        Physical Exam: Vital Signs Blood pressure 136/85, pulse 81, temperature 97.9 F (36.6 C), resp. rate 20, height 5\' 7"  (1.702 m), weight 54.2 kg, SpO2 97 %.  Constitutional: No distress . Vital signs reviewed. HEENT: EOMI, oral membranes moist Neck: supple Cardiovascular: RRR without murmur. No JVD    Respiratory/Chest: CTA Bilaterally without wheezes or rales. Normal effort    GI/Abdomen: BS +, non-tender, non-distended Ext: no clubbing, cyanosis, or edema Psych: pleasant and confused  Neuro: more alert. Distracted but speech more intelligible. Able to carry conversation. moves all 4's, confused.     Assessment/Plan: 1. Functional deficits which require 3+ hours per day of interdisciplinary therapy in a comprehensive inpatient rehab setting.  Physiatrist is providing close team supervision and 24 hour management of active medical problems listed below.  Physiatrist and rehab team continue to assess barriers to discharge/monitor patient progress toward functional and medical goals  Care Tool:  Bathing  Bathing activity did not occur: Safety/medical concerns     Body parts bathed by helper: Right arm,Left arm,Chest,Abdomen,Front perineal area,Buttocks,Right upper leg,Left upper leg,Left lower leg,Right lower leg,Face     Bathing assist Assist Level: Total Assistance - Patient < 25%     Upper Body  Dressing/Undressing Upper body dressing Upper body dressing/undressing activity did not occur (including orthotics): Safety/medical concerns What is the patient wearing?: Pull over shirt    Upper body assist Assist Level: Total Assistance - Patient < 25%    Lower Body Dressing/Undressing Lower body dressing      What is the patient wearing?: Incontinence brief,Pants     Lower body assist Assist for lower body dressing: Total Assistance - Patient < 25%     Toileting Toileting Toileting Activity did not occur and hygiene only): N/A (no void or bm)  Toileting assist Assist for toileting: 2 Helpers     Transfers Chair/bed transfer  Transfers assist  Chair/bed transfer activity did not occur: Safety/medical concerns  Chair/bed transfer assist level: Maximal Assistance - Patient 25 - 49%     Locomotion Ambulation   Ambulation assist   Ambulation activity did not occur: Safety/medical concerns  Assist level: 2 helpers Assistive device: Hand held assist Max distance: 116 ft   Walk 10 feet activity   Assist  Walk 10 feet activity did not occur: Safety/medical concerns  Assist level: 2 helpers Assistive device: Hand held assist   Walk 50 feet activity   Assist Walk 50 feet with 2 turns activity did not occur: Safety/medical concerns  Assist level: 2 helpers Assistive device: Hand held assist  Walk 150 feet activity   Assist Walk 150 feet activity did not occur: Safety/medical concerns         Walk 10 feet on uneven surface  activity   Assist Walk 10 feet on uneven surfaces activity did not occur: Safety/medical concerns         Wheelchair     Assist Will patient use wheelchair at discharge?: Yes (pt may benefit from [facility] assessment however unsafe to attempt this AM; per PT long term goals)   Wheelchair activity did not occur: Safety/medical concerns         Wheelchair 50 feet with 2 turns  activity    Assist    Wheelchair 50 feet with 2 turns activity did not occur: Safety/medical concerns       Wheelchair 150 feet activity     Assist  Wheelchair 150 feet activity did not occur: Safety/medical concerns       Blood pressure 136/85, pulse 81, temperature 97.9 F (36.6 C), resp. rate 20, height 5\' 7"  (1.702 m), weight 54.2 kg, SpO2 97 %.  Medical Problem List and Plan: 1.  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             -patient may shower             - SNF placement now pending.              -Continue CIR therapies including PT, OT, and SLP    - Advanced WM changes on MRI 4/12 likely related to SVD. Pt + for cocaine on admit.      2.  Impaired mobility -DVT/anticoagulation: Continue Lovenox             -antiplatelet therapy: Aspirin 325 mg 3. Pain Management: Continue Tylenol as needed 4. Agitation/psychosis: Antipsychotic: Continue zyprexa 5mg  hs and 2.5mg  daily  - Continue bilateral mittens as needed  -depakote 750mg  tid   -level was 94 on Monday   -reduced to 500mg  bid  -melatonin 3mg  HS  -5/9 behavior less agitated and more appropriate    5. Neuropsych: This patient is capable of making decisions on her own behalf. 6. Skin/Wound Care: Routine skin checks 7. Fluids/Electrolytes/Nutrition:     Filed Weights   09/21/20 0613 09/22/20 0430 09/24/20 0416  Weight: 54.6 kg 54.8 kg 54.2 kg    5/6-->31/1.23 5/6  -continue HS IVF 8.  Seizure disorder.  Vimpat 200 mg twice daily, valproic acid 1000 mg twice daily.  EEG negative 9.  Hypertension.  Norvasc 10 mg daily     5/9 bp under reasonable control.  continue lopressor 25mg  bid 10.  History of bilateral cerebral convexity infarction frontal region.  CIR 08/19/2019-08/27/2019 11.  Hyperlipidemia.  Continue Lipitor 12.  History of polysubstance abuse.  Urine drug screen positive cocaine.   13. AKI:  creatinine normal on 4/14. Some elevation d/t prerenal  14. UTI: UCX never performed.     -f/u urine cx negative 15. Dysphagia:     -4/19 diet downgraded to D1, thins   -intake remains inconsistent, needs full supervision LOS: 24 days A FACE TO FACE EVALUATION WAS PERFORMED  7/9 09/24/2020, 10:00 AM

## 2020-09-24 NOTE — Progress Notes (Signed)
Speech Language Pathology Daily Session Note  Patient Details  Name: Cindy [name] MRN: [id number] Date of Birth: [birth year]  Today's Date: 09/24/2020 SLP Individual Time: 0920-0940 SLP Individual Time Calculation (min): 20 min  Short Term Goals: Week 4: SLP Short Term Goal 1 (Week 4): Patient will focus attention to verbal or tactile stimuli in 25% of opportunities with Max A multimodal cues. SLP Short Term Goal 2 (Week 4): Patient will respond to basic yes/no questions in 25% of opportunities with Max A multimodal cues. SLP Short Term Goal 3 (Week 4): Patient will follow 1-step commands in 25% of opportunities with Max A multimodal cues. SLP Short Term Goal 4 (Week 4): Patient will be ~50% intelligibile at the word level with Max A multimodal cues. SLP Short Term Goal 5 (Week 4): Patient will consume current diet of Dys. 1 textures with thin liquids with minimal overt s/s of aspiration and Mod A multimodal cues for use of swallowing compensatory strategies.  Skilled Therapeutic Interventions: Skilled treatment session focused on cognitive goals. Upon arrival, patient was with OT and had just completed a shower. SLP facilitated session by providing extra time and overall Min verbal cues for following commands while attempting to reposition herself in bed and independently initiated bridging to pull up her pants. Patient was initially responsive to questions with 75% intelligibility at the word and phrase level. However, once comfortable, patient stopped responding to questions and fell asleep with decreased ability to awaken. Therefore, patient missed remaining 10 minutes of session. Patient left upright in bed with alarm on and all needs within reach. Continue with current plan of care.      Pain No/Denies Pain   Therapy/Group: Individual Therapy  Zyana Amaro 09/24/2020, 10:36 AM

## 2020-09-24 NOTE — Progress Notes (Signed)
Occupational Therapy Session Note  Patient Details  Name: Cindy [name] MRN: [id number] Date of Birth: June 12, 1960  Today's Date: 09/24/2020 OT Individual Time: 2355-7322 OT Individual Time Calculation (min): 40 min    Short Term Goals: Week 4:  OT Short Term Goal 1 (Week 4): Pt will self feed 25% of meal wiht MOD HOH A OT Short Term Goal 2 (Week 4): Pt will intiate automatic task wiht mod multimodal cuing at sink OT Short Term Goal 3 (Week 4): Pt iwll tolerate OOB activity 1x per day.  Skilled Therapeutic Interventions/Progress Updates:    Pt received supine, restless as NT assisted with breakfast. Pt completed bed mobility to EOB with max A . Pt much more alert and actually intelligible for several words, with appropriate answers. Pt was transferred to the TIS shower chair with total stand pivot. Pt was taken into shower, she did demonstrate improvement in automatic motor patterns with familiar shower environment. She washed hair with min cueing for initiation and max A for termination. She perseverated on washing her hair especially. Pt much more functional with LUE than R. She washed UB with min A for thoroughness. LB completed with max A. Pt was transferred back to bed with max A stand pivot transfer. Pt required total A for bathing. Pt was passed off to SLP in room.   Therapy Documentation Precautions:  Precautions Precautions: Fall Precaution Comments: bilateral mitts and bedrails padded Restrictions Weight Bearing Restrictions: No  Therapy/Group: Individual Therapy  Crissie Reese 09/24/2020, 6:30 AM

## 2020-09-25 MED ORDER — HALOPERIDOL LACTATE 5 MG/ML IJ SOLN
0.5000 mg | Freq: Two times a day (BID) | INTRAMUSCULAR | Status: DC | PRN
  Administered 2020-10-24: 0.5 mg via INTRAMUSCULAR
  Filled 2020-09-25 (×3): qty 0.1

## 2020-09-25 MED ORDER — HALOPERIDOL 0.5 MG PO TABS
0.5000 mg | ORAL_TABLET | Freq: Two times a day (BID) | ORAL | Status: DC | PRN
  Administered 2020-10-03 – 2020-10-09 (×7): 0.5 mg via ORAL
  Filled 2020-09-25 (×8): qty 1

## 2020-09-25 NOTE — Progress Notes (Signed)
PROGRESS NOTE   Subjective/Complaints: No new issues today. Seems to have slept last night. Behavior can wax and wane  ROS: Limited due to cognitive/behavioral   Objective:   No results found. No results for input(s): WBC, HGB, HCT, PLT in the last 72 hours. No results for input(s): NA, K, CL, CO2, GLUCOSE, BUN, CREATININE, CALCIUM in the last 72 hours.  Intake/Output Summary (Last 24 hours) at 09/25/2020 0917 Last data filed at 09/24/2020 1901 Gross per 24 hour  Intake 177 ml  Output --  Net 177 ml        Physical Exam: Vital Signs Blood pressure (!) 145/79, pulse 84, temperature 97.9 F (36.6 C), resp. rate 18, height 5\' 7"  (1.702 m), weight 54.7 kg, SpO2 100 %.  Constitutional: No distress . Vital signs reviewed. HEENT: EOMI, oral membranes moist Neck: supple Cardiovascular: RRR without murmur. No JVD    Respiratory/Chest: CTA Bilaterally without wheezes or rales. Normal effort    GI/Abdomen: BS +, non-tender, non-distended Ext: no clubbing, cyanosis, or edema Psych: distracted, restless.   Neuro: alert, follows simple commands.  moves all 4's, confused.     Assessment/Plan: 1. Functional deficits which require 3+ hours per day of interdisciplinary therapy in a comprehensive inpatient rehab setting.  Physiatrist is providing close team supervision and 24 hour management of active medical problems listed below.  Physiatrist and rehab team continue to assess barriers to discharge/monitor patient progress toward functional and medical goals  Care Tool:  Bathing  Bathing activity did not occur: Safety/medical concerns     Body parts bathed by helper: Right arm,Left arm,Chest,Abdomen,Front perineal area,Buttocks,Right upper leg,Left upper leg,Left lower leg,Right lower leg,Face     Bathing assist Assist Level: Total Assistance - Patient < 25%     Upper Body Dressing/Undressing Upper body dressing Upper  body dressing/undressing activity did not occur (including orthotics): Safety/medical concerns What is the patient wearing?: Pull over shirt    Upper body assist Assist Level: Total Assistance - Patient < 25%    Lower Body Dressing/Undressing Lower body dressing      What is the patient wearing?: Incontinence brief,Pants     Lower body assist Assist for lower body dressing: Total Assistance - Patient < 25%     Toileting Toileting Toileting Activity did not occur and hygiene only): N/A (no void or bm)  Toileting assist Assist for toileting: 2 Helpers     Transfers Chair/bed transfer  Transfers assist  Chair/bed transfer activity did not occur: Safety/medical concerns  Chair/bed transfer assist level: Maximal Assistance - Patient 25 - 49%     Locomotion Ambulation   Ambulation assist   Ambulation activity did not occur: Safety/medical concerns  Assist level: 2 helpers Assistive device: Hand held assist Max distance: 116 ft   Walk 10 feet activity   Assist  Walk 10 feet activity did not occur: Safety/medical concerns  Assist level: 2 helpers Assistive device: Hand held assist   Walk 50 feet activity   Assist Walk 50 feet with 2 turns activity did not occur: Safety/medical concerns  Assist level: 2 helpers Assistive device: Hand held assist    Walk 150 feet activity  Assist Walk 150 feet activity did not occur: Safety/medical concerns         Walk 10 feet on uneven surface  activity   Assist Walk 10 feet on uneven surfaces activity did not occur: Safety/medical concerns         Wheelchair     Assist Will patient use wheelchair at discharge?: Yes (pt may benefit from [facility] assessment however unsafe to attempt this AM; per PT long term goals)   Wheelchair activity did not occur: Safety/medical concerns         Wheelchair 50 feet with 2 turns activity    Assist    Wheelchair 50 feet with 2 turns activity  did not occur: Safety/medical concerns       Wheelchair 150 feet activity     Assist  Wheelchair 150 feet activity did not occur: Safety/medical concerns       Blood pressure (!) 145/79, pulse 84, temperature 97.9 F (36.6 C), resp. rate 18, height 5\' 7"  (1.702 m), weight 54.7 kg, SpO2 100 %.  Medical Problem List and Plan: 1.  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             -patient may shower             - SNF placement now pending.             -Continue CIR therapies including PT, OT, and SLP   - Advanced WM changes on MRI 4/12 likely related to SVD. Pt + for cocaine on admit.      2.  Impaired mobility -DVT/anticoagulation: Continue Lovenox             -antiplatelet therapy: Aspirin 325 mg 3. Pain Management: Continue Tylenol as needed 4. Agitation/psychosis: Antipsychotic: Continue zyprexa 5mg  hs and 2.5mg  daily  - Continue bilateral mittens as needed  -depakote 750mg  tid   -level was 94 on Monday   -reduced to 500mg  bid  -melatonin 3mg  HS  -5/10 behavior less agitated and more appropriate    -decreased prn haldol dosing 5. Neuropsych: This patient is capable of making decisions on her own behalf. 6. Skin/Wound Care: Routine skin checks 7. Fluids/Electrolytes/Nutrition:     Filed Weights   09/22/20 0430 09/24/20 0416 09/25/20 0321  Weight: 54.8 kg 54.2 kg 54.7 kg    5/6-->31/1.23 5/9  -continue HS IVF  -weights are holding 8.  Seizure disorder.  Vimpat 200 mg twice daily, valproic acid 1000 mg twice daily.  EEG negative 9.  Hypertension.  Norvasc 10 mg daily     5/10 bp under reasonable control.  continue lopressor 25mg  bid 10.  History of bilateral cerebral convexity infarction frontal region.  CIR 08/19/2019-08/27/2019 11.  Hyperlipidemia.  Continue Lipitor 12.  History of polysubstance abuse.  Urine drug screen positive cocaine.   13. AKI: any increases  in Cr seem to be prerenal   14. UTI: UCX never performed.     -f/u urine cx negative 15. Dysphagia:     -4/19 diet downgraded to D1, thins   -intake remains inconsistent, needs full supervision  LOS: 25 days A FACE TO FACE EVALUATION WAS PERFORMED  12-02-2001 09/25/2020, 9:17 AM

## 2020-09-25 NOTE — Progress Notes (Signed)
Patient ID: Cindy [name], female   DOB: 1960/10/17, 60 y.o.   MRN: [id number]  SW left message for [name]/ Admisisons with [name] [phone]) to confirm if pt husband went to facility yesterday for appointment.  [name], MSW, LCSWA Office: [phone] Cell: [phone] Fax: [phone]

## 2020-09-25 NOTE — Patient Care Conference (Signed)
Inpatient RehabilitationTeam Conference and Plan of Care Update Date: 09/25/2020   Time: 10:01 AM    Patient Name: Cindy [name]      Medical Record Number: [id number]  Date of Birth: December 13, 1960 Sex: Female         Room/Bed: 4W08C/4W08C-01 Payor Info: Payor: MEDICAID [place] / Plan: MEDICAID OF [place] / Product Type: *No Product type* /    Admit Date/Time:  08/31/2020  5:12 PM  Primary Diagnosis:  Acute metabolic encephalopathy  Hospital Problems: Principal Problem:   Acute metabolic encephalopathy    Expected Discharge Date: Expected Discharge Date:  (SNF)  Team Members Present: Physician leading conference: Dr. Faith Rogue Care Coodinator Present: [name], LCSWA;Gavynn Duvall Marlyne Beards, RN, BSN, CRRN Nurse Present: Kennyth Arnold, RN PT Present: Malachi Pro, PT OT Present: Jake Shark, OT SLP Present: Feliberto Gottron, SLP PPS Coordinator present : Fae Pippin, SLP     Current Status/Progress Goal Weekly Team Focus  Bowel/Bladder   Patient is incontinent of B/B. LBM 09/23/2020.  Restore continence of B/B.  Continue timed toileting Q2H.   Swallow/Nutrition/ Hydration   Dys. 1 textures with thin liquids, Max a  Mod A  safety and tolerance with current diet   ADL's   Improvement in automatic motor planning bathing at shower level- able to wash hair and UB with min A, max cueing/facilitation to terminate motor plans. Still total A for ADLs overall and transfers  POC adjusted to d/c ADLs and reflect caregiver total A at SNF level  Familiar ADL routine/motor planning, OOB tolerance, transfers   Mobility   MaxA for bed mobility, modA for sitting balance, max/totalA for gait ranging from 15-123ft. Performance fluctuates significantly  Modified goals: mod A transfers, hoyer lift transfers for variable performance, d/c w/c goals  Activity tolerance, functional mobility, caregiver and pt education, safety awareness   Communication   Max A  Mod A  increased speech intelligibility at word  level, responsiveness to questions   Safety/Cognition/ Behavioral Observations  Max-Total A  Max A  attention, following commands   Pain   0/10 on faces pain scale.  Pain <=2/10.  Assess pain Q shift and PRN. Administer PRN pain medication per order.   Skin   Skin is intact.  Skin will remain intact and free from s/s of infection.  Assess skin Q shift and PRN.     Discharge Planning:  SNF pending bed offer   Team Discussion: No new medical updates. Patient is incontinent B/B. Patient gets days and nights confused occasionally. Patient on target to meet rehab goals: no, patient had a good day yesterday and was able to take a shower. Was still a total assist overall. For physical therapy patient is still a total assist for everything. For SLP patient is talking 50-75% understandable. She has not been able to progress in her diet.  *See Care Plan and progress notes for long and short-term goals.   Revisions to Treatment Plan:  Not at this times  Teaching Needs: Family education, medication management, transfer training, gait training, balance training, endurance training, safety awareness.  Current Barriers to Discharge: Decreased caregiver support, Medical stability, Home enviroment access/layout, Incontinence, Lack of/limited family support, Insurance for SNF coverage, Medication compliance and Behavior  Possible Resolutions to Barriers: Continue current medications, provide emotional support.     Medical Summary Current Status: less agitated, still confused but has been more alert and attentive  Barriers to Discharge: Behavior;Medical stability   Possible Resolutions to [employer] Focus: daily assessment of med regimen,  nutritional assessment, rx   Continued Need for Acute Rehabilitation Level of Care: The patient requires daily medical management by a physician with specialized training in physical medicine and rehabilitation for the following reasons: Direction of a  multidisciplinary physical rehabilitation program to maximize functional independence : Yes Medical management of patient stability for increased activity during participation in an intensive rehabilitation regime.: Yes Analysis of laboratory values and/or radiology reports with any subsequent need for medication adjustment and/or medical intervention. : Yes   I attest that I was present, lead the team conference, and concur with the assessment and plan of the team.   Tennis Must 09/25/2020, 10:16 AM

## 2020-09-25 NOTE — Progress Notes (Signed)
Occupational Therapy Session Note  Patient Details  Name: Cindy [name] MRN: [id number] Date of Birth: 19-Aug-1960  Today's Date: 09/25/2020 OT Individual Time: 6144-3154 OT Individual Time Calculation (min): 42 min    Short Term Goals: Week 4:  OT Short Term Goal 1 (Week 4): Pt will self feed 25% of meal wiht MOD HOH A OT Short Term Goal 2 (Week 4): Pt will intiate automatic task wiht mod multimodal cuing at sink OT Short Term Goal 3 (Week 4): Pt iwll tolerate OOB activity 1x per day.  Skilled Therapeutic Interventions/Progress Updates:    Pt received supine, curled up sleeping. It took several minutes to wake her up but then she was able to maintain appropriate arousal throughout session. Pt required dependent brief change (incontinent of urine) and donning of pants at bed level. Pt was transferred to EOB with total A. Total A stand pivot transfer. Pt more alert and engaged once sitting up in TIS. Pt was brought into therapy gym to eat breakfast. Attempted several different food/drink options and pt declined eating or drinking anything. NT was alerted. Pt was then taken to the therapy gym where OT attempted to engage her in automatic motor patterns using arm bike. Pt completed 5 pedals with mod A before pulling her arms back and speaking (unable to understand pt). She was returned to her room and transferred back to bed. Pt left supine with all 4 rails up, bed alarm and posey belt on.   Therapy Documentation Precautions:  Precautions Precautions: Fall Precaution Comments: bilateral mitts and bedrails padded Restrictions Weight Bearing Restrictions: No  Therapy/Group: Individual Therapy  Crissie Reese 09/25/2020, 6:26 AM

## 2020-09-25 NOTE — Progress Notes (Signed)
Physical Therapy Session Note  Patient Details  Name: Cindy [name] MRN: [id number] Date of Birth: 11/20/1960  Today's Date: 09/25/2020 PT Missed Time: 30 Minutes Missed Time Reason: Patient fatigue  Short Term Goals: Week 3:  PT Short Term Goal 1 (Week 3): STG=LTG due to ELOS.  Skilled Therapeutic Interventions/Progress Updates:     Pt asleep and difficult to rouse. PT will follow up as able.  Therapy Documentation Precautions:  Precautions Precautions: Fall Precaution Comments: bilateral mitts and bedrails padded Restrictions Weight Bearing Restrictions: No General: PT Amount of Missed Time (min): 30 Minutes PT Missed Treatment Reason: Patient fatigue   Therapy/Group: Individual Therapy  Beau Fanny, PT, DPT 09/25/2020, 4:00 PM

## 2020-09-25 NOTE — Progress Notes (Signed)
Nutrition Follow-up  DOCUMENTATION CODES:  Non-severe (moderate) malnutrition in context of chronic illness  INTERVENTION:  Continue Ensure Enlive and Magic Cup TID.  Continue MVI with minerals daily.  Continue feeding assistance with all meals.  NUTRITION DIAGNOSIS:  Moderate Malnutrition related to chronic illness as evidenced by moderate fat depletion,moderate muscle depletion. 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 Cranial CT/MRI scan was performed and was negative for acute changes but did show remote lacunar infarct. EEG suggestive of cortical dysfunction arising from left hemisphere likely 2/2 underlying structural abnormality post ictal state no seizure or definite epileptiform discharges. Admitted to CIR 4/15.  Attempted to visit pt at bedside. Pt unresponsive and stayed asleep despite calling her name x3 and to touch.  Per Epic, intake variable - from documented meals, it ranges from 0-90%, mostly 0-50%. Pt averaging ~44% of meal intake, which is a decrease from last week.  Admit wt: 55.1 kg Current wt: 54.7 kg  RD to continue Ensure Enlive TID, MVI with minerals daily, and Magic Cup TID to promote intake, as well as feeding assistance at all meals.  Medications: reviewed; Ensure Enlive TID, folic acid, MVI with minerals, Protonix, Klor-Con 20 mEq, IVF @ 75 ml/hr  Labs: reviewed; CBG 97-126  Diet  Order:   Diet Order            DIET - DYS 1 Room service appropriate? Yes; Fluid consistency: Thin  Diet effective now                EDUCATION NEEDS:  Not appropriate for education at this time  Skin:  Skin Assessment: Reviewed RN Assessment  Last BM:  09/23/20  Height:  Ht Readings from Last 1 Encounters:  09/03/20 5\' 7"  (1.702 m)   Weight:  Wt Readings from Last 1 Encounters:  09/25/20 54.7 kg   BMI:  Body mass index is 18.89 kg/m.  Estimated Nutritional Needs:  Kcal:  1800-2000 Protein:  90-100 grams Fluid:  >1.8L  11/25/20, RD, LDN Registered Dietitian After Hours/Weekend Pager # in Amion

## 2020-09-26 NOTE — Progress Notes (Signed)
Physical Therapy Weekly Progress Note  Patient Details  Name: Cindy [name] MRN: [id number] Date of Birth: 06/09/1960  Beginning of progress report period: Sep 18, 2020 End of progress report period: Sep 26, 2020  Today's Date: 09/26/2020 PT Individual Time: 1300-1330 and 1425-1440 PT Individual Time Calculation (min): 30 min and 15 min   Patient continues to have fluctuating progress due to intermittent lethargy limiting participation and cognitive deficits impairing patient's ability to attend and motor plan with functional mobility. Agitation is improving during therapy sessions. Patient continues to require total-mod A +1-2 for bed mobility and transfers and +2 assist for ambulation intermittently. Patient has engaged in more therapy sessions this week and performed some functional tasks, washing her hands in standing and brushing her hair.  Patient continues to demonstrate the following deficits muscle weakness and muscle joint tightness, decreased cardiorespiratoy endurance, impaired timing and sequencing, abnormal tone, ataxia, decreased coordination and decreased motor planning, decreased midline orientation and decreased attention to right, decreased initiation, decreased attention, decreased awareness, decreased problem solving, decreased safety awareness, decreased memory and delayed processing and decreased sitting balance, decreased standing balance, decreased postural control, hemiplegia, decreased balance strategies and difficulty maintaining precautions and therefore will continue to benefit from skilled PT intervention to increase functional independence with mobility.  Patient not progressing toward long term goals.  See goal revision..  Plan of care revisions: Plan to d/c to SNF for continued skilled therapies due to increased burden of care at patient's current level of function (see above and below)..  PT Short Term Goals Week 3:  PT Short Term Goal 1 (Week 3): STG=LTG due to  ELOS. PT Short Term Goal 1 - Progress (Week 3): Progressing toward goal Week 4:  PT Short Term Goal 1 (Week 4): STG=LTG, patient is pending SNF placemet.  Skilled Therapeutic Interventions/Progress Updates:     Session 1 & 2: Patient in bed asleep upon PT arrival. Patient initially difficult to arouse, but breathing deeply. Once sitting EOB patient became alert and was pleasant, smiling and engaging with therapist, throughout session.   Patient with increased appropriate verbalizations during sessions. Introducing herself and asking therapist's name, asking to wash her hands, and verbalizing fear of falling with gait training. Some intermittent unintelligible speech and perseveration on repeating what PT said.   Therapeutic Activity: Bed Mobility: Patient performed supine to sit with total A +2 due to decreased arousal. She performed sit to supine with CGA-min A of 1 person. Provided verbal cues for leaning onto her L elbow to lower her trunk and bringing her knees to her chest to bring her legs onto the bed. Required facilitation for initiation only. Transfers: Patient performed sit to/from stand x1 with max A and x1 with mod-min A. She performed stand pivot bed<>TIS w/c with mod A and increased time and facilitation for initiation of stepping. Provided simple one-step cues "stand-up" "come sit in the chair" for improved initiation, comprehension, and attention to task. Doffed/donned paper scrub pants with max-total A, threading lower extremities in sitting, patient initiated this, but stopped before performing the task, and pulling pants up with max A from a second person due to PT providing mod-max assist for standing balance.  Combed patient's hair and patient automatically grasped the comb with her R hand and completed the task with min-mod A for comb placement due to apraxia.  Gait Training:  Patient ambulated 25 feet holding PTs arms with PT in front of her to reduce posterior bias with max  A of  1 person and +2 for w/c follow for safety. Ambulated with narrow BOS, increased PF with hyperextension at the knees, and increased hip/trunk flexion with significant cervical flexion and poor head control. Provided verbal cues and facilitation for initiation, weight shifting, looking ahead (without success), and significant encouragement to continue. Patient with LOB with a wider R step, able to correct with max A, however, patient reported fear of falling and returned to sitting. Patient declined further ambulation due to fear of falling at this time.   Wheelchair Mobility:  Patient was transported in the w/c with total A throughout session for energy conservation and time management.  Patient agreeable to sitting at nurses station with nurse secretary, as she was alert and demonstrating improved trunk support in TIS today, RN aware and in agreement. Patient tolerated x1 hour. PT returned to patient dosing in the TIS chair, but easily aroused and smiling at therapist. Returned patient to her room and transferred back to bed, as described above. Patient mildly restless with Posey belt placement, initally grabbing and pulling at the belt as PT attempted to apply it. Educated patient on purpose of the bed and patient allowed PT to place the belt and did not pull at the belt again.   Patient in bed with her eyes closed without mitts donned, RN aware, at end of session with breaks locked, bed and Posey belt alarm set, and all needs within reach. Meditation music playing on computer for low stim environment at end of session.   Therapy Documentation Precautions:  Precautions Precautions: Fall Precaution Comments: bilateral mitts and bedrails padded Restrictions Weight Bearing Restrictions: No   Therapy/Group: Individual Therapy  Bronte Kropf L Kierre Hintz PT, DPT  09/26/2020, 4:19 PM

## 2020-09-26 NOTE — Progress Notes (Signed)
Occupational Therapy Session Note  Patient Details  Name: Cindy [name] MRN: [id number] Date of Birth: October 22, 1960  Today's Date: 09/26/2020 OT Individual Time: 6808-8110 OT Individual Time Calculation (min): 51 min    Short Term Goals: Week 4:  OT Short Term Goal 1 (Week 4): Pt will self feed 25% of meal wiht MOD HOH A OT Short Term Goal 2 (Week 4): Pt will intiate automatic task wiht mod multimodal cuing at sink OT Short Term Goal 3 (Week 4): Pt iwll tolerate OOB activity 1x per day.  Skilled Therapeutic Interventions/Progress Updates:    Pt supine in bed, sleeping soundly but easily awoken. Pt became restless quickly upon waking up, talking quickly and moving frequently. Pt about 25% intelligible with her speech. From supine she did participate in LB dressing more than usual performance, bridging and B pulling up pants. She required max A to come EOB and to don new shirt. She completed stand pivot transfer to the w/c with max A. Once in the chair pt settled down once tilted backwards. She was taken to the dayroom to eat breakfast. Pt was able to self feed with loading of the spoon by OT 50% of her meal! Pt required intermittent min cueing for swallow. Pt was returned to her room and supine, with all needs met. Posey belt fastened, bed alarm set. Husband present.   Therapy Documentation Precautions:  Precautions Precautions: Fall Precaution Comments: bilateral mitts and bedrails padded Restrictions Weight Bearing Restrictions: No   Therapy/Group: Individual Therapy  Curtis Sites 09/26/2020, 6:25 AM

## 2020-09-26 NOTE — Progress Notes (Signed)
SLP Cancellation Note  Patient Details Name: NONA GRACEY MRN: [id number] DOB: 11/30/1960   Cancelled treatment:        Pt missed 30 minutes. Pt sleeping and unable to wake up.                                                                                                 [name] A Nil Bolser 09/26/2020, 12:56 PM

## 2020-09-26 NOTE — Progress Notes (Signed)
Patient ID: Cindy [name], female   DOB: 30-Jul-1960, 60 y.o.   MRN: [id number]  SW [facility] with [name][facility] to confirm if pt husband came to appointment on Monday. Confirms he was present for meeting. States there are still more financial documents that they need for processing a long term Medicaid application, however the process has been initiated with the business office. SW informed that pt has not worked and was denied SSDI in past due to limited work. She states she will share with business office and will follow-up once the business office gives an answer.   [name], MSW, LCSWA Office: [phone] Cell: [phone] Fax: [phone]

## 2020-09-27 MED ORDER — PANTOPRAZOLE SODIUM 40 MG PO PACK
40.0000 mg | PACK | Freq: Every day | ORAL | Status: DC
  Administered 2020-09-28 – 2020-10-24 (×27): 40 mg
  Filled 2020-09-27 (×20): qty 20

## 2020-09-27 NOTE — Progress Notes (Signed)
SLP Cancellation Note  Patient Details Name: [name] MRN: [id number] DOB: [birth year]   Cancelled treatment:        Patient asleep and unable to be aroused despite numerous attempts by SLP.   Angela Nevin, MA, CCC-SLP Speech Therapy

## 2020-09-27 NOTE — Progress Notes (Signed)
PROGRESS NOTE   Subjective/Complaints: No new issues reported. Pt restless but appears comfortable  ROS: Limited due to cognitive/behavioral    Objective:   No results found. No results for input(s): WBC, HGB, HCT, PLT in the last 72 hours. No results for input(s): NA, K, CL, CO2, GLUCOSE, BUN, CREATININE, CALCIUM in the last 72 hours.  Intake/Output Summary (Last 24 hours) at 09/27/2020 1035 Last data filed at 09/27/2020 0536 Gross per 24 hour  Intake 1050 ml  Output --  Net 1050 ml        Physical Exam: Vital Signs Blood pressure 116/64, pulse 70, temperature 98.6 F (37 C), temperature source Oral, resp. rate 18, height 5\' 7"  (1.702 m), weight 55.8 kg, SpO2 97 %.  Constitutional: No distress . Vital signs reviewed. HEENT: EOMI, oral membranes moist Neck: supple Cardiovascular: RRR without murmur. No JVD    Respiratory/Chest: CTA Bilaterally without wheezes or rales. Normal effort    GI/Abdomen: BS +, non-tender, non-distended Ext: no clubbing, cyanosis, or edema Psych: distracted.  Neuro: alert, follows simple commands.  moves all 4's, confused.     Assessment/Plan: 1. Functional deficits which require 3+ hours per day of interdisciplinary therapy in a comprehensive inpatient rehab setting.  Physiatrist is providing close team supervision and 24 hour management of active medical problems listed below.  Physiatrist and rehab team continue to assess barriers to discharge/monitor patient progress toward functional and medical goals  Care Tool:  Bathing  Bathing activity did not occur: Safety/medical concerns     Body parts bathed by helper: Right arm,Left arm,Chest,Abdomen,Front perineal area,Buttocks,Right upper leg,Left upper leg,Left lower leg,Right lower leg,Face     Bathing assist Assist Level: Total Assistance - Patient < 25%     Upper Body Dressing/Undressing Upper body dressing Upper body  dressing/undressing activity did not occur (including orthotics): Safety/medical concerns What is the patient wearing?: Pull over shirt    Upper body assist Assist Level: Total Assistance - Patient < 25%    Lower Body Dressing/Undressing Lower body dressing      What is the patient wearing?: Incontinence brief,Pants     Lower body assist Assist for lower body dressing: Total Assistance - Patient < 25%     Toileting Toileting Toileting Activity did not occur and hygiene only): N/A (no void or bm)  Toileting assist Assist for toileting: 2 Helpers     Transfers Chair/bed transfer  Transfers assist  Chair/bed transfer activity did not occur: Safety/medical concerns  Chair/bed transfer assist level: Maximal Assistance - Patient 25 - 49%     Locomotion Ambulation   Ambulation assist   Ambulation activity did not occur: Safety/medical concerns  Assist level: 2 helpers Assistive device: Hand held assist Max distance: 116 ft   Walk 10 feet activity   Assist  Walk 10 feet activity did not occur: Safety/medical concerns  Assist level: 2 helpers Assistive device: Hand held assist   Walk 50 feet activity   Assist Walk 50 feet with 2 turns activity did not occur: Safety/medical concerns  Assist level: 2 helpers Assistive device: Hand held assist    Walk 150 feet activity   Assist Walk 150 feet activity  did not occur: Safety/medical concerns         Walk 10 feet on uneven surface  activity   Assist Walk 10 feet on uneven surfaces activity did not occur: Safety/medical concerns         Wheelchair     Assist Will patient use wheelchair at discharge?: Yes (pt may benefit from [facility] assessment however unsafe to attempt this AM; per PT long term goals)   Wheelchair activity did not occur: Safety/medical concerns         Wheelchair 50 feet with 2 turns activity    Assist    Wheelchair 50 feet with 2 turns activity did  not occur: Safety/medical concerns       Wheelchair 150 feet activity     Assist  Wheelchair 150 feet activity did not occur: Safety/medical concerns       Blood pressure 116/64, pulse 70, temperature 98.6 F (37 C), temperature source Oral, resp. rate 18, height 5\' 7"  (1.702 m), weight 55.8 kg, SpO2 97 %.  Medical Problem List and Plan: 1.  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             -patient may shower             - SNF placement now pending.             -Continue CIR therapies including PT, OT, and SLP   - Advanced WM changes on MRI 4/12 likely related to SVD. Pt + for cocaine on admit.      2.  Impaired mobility -DVT/anticoagulation: Continue Lovenox             -antiplatelet therapy: Aspirin 325 mg 3. Pain Management: Continue Tylenol as needed 4. Agitation/psychosis: Antipsychotic: Continue zyprexa 5mg  hs and 2.5mg  daily  - Continue bilateral mittens as needed  -depakote 750mg  tid   -level was 94 on Monday   -reduced to 500mg  bid  -melatonin 3mg  HS  -5/12 behavior less agitated as a whole    -limit prn haldol   5. Neuropsych: This patient is capable of making decisions on her own behalf. 6. Skin/Wound Care: Routine skin checks 7. Fluids/Electrolytes/Nutrition:     Filed Weights   09/25/20 0321 09/26/20 0605 09/27/20 0514  Weight: 54.7 kg 55.5 kg 55.8 kg    5/6-->31/1.23 5/9  -continue HS IVF  -weights are holding to improved 8.  Seizure disorder.  Vimpat 200 mg twice daily, valproic acid 1000 mg twice daily.  EEG negative 9.  Hypertension.  Norvasc 10 mg daily     5/12 bp under reasonable control.  continue lopressor 25mg  bid 10.  History of bilateral cerebral convexity infarction frontal region.  CIR 08/19/2019-08/27/2019 11.  Hyperlipidemia.  Continue Lipitor 12.  History of polysubstance abuse.  Urine drug screen positive cocaine.   13. AKI: any  increases in Cr seem to be prerenal   14. UTI: UCX never performed.     -f/u urine cx negative 15. Dysphagia:     -4/19 diet downgraded to D1, thins   -intake remains inconsistent, needs full supervision  LOS: 27 days A FACE TO FACE EVALUATION WAS PERFORMED  7/9 09/27/2020, 10:35 AM

## 2020-09-27 NOTE — Progress Notes (Signed)
Pt spit out a portion of her night time medications.

## 2020-09-27 NOTE — Progress Notes (Signed)
Occupational Therapy Session Note  Patient Details  Name: Cindy [name] MRN: [id number] Date of Birth: [birth year]  Today's Date: 09/27/2020 OT Individual Time: 1440-1520 OT Individual Time Calculation (min): 40 min   Short Term Goals: Week 4:  OT Short Term Goal 1 (Week 4): Pt will self feed 25% of meal wiht MOD HOH A OT Short Term Goal 2 (Week 4): Pt will intiate automatic task wiht mod multimodal cuing at sink OT Short Term Goal 3 (Week 4): Pt iwll tolerate OOB activity 1x per day.  Skilled Therapeutic Interventions/Progress Updates:    [name] more appropriate without any outbursts throughout session. Pt did a good job following 50% of commands today. Pt noted to be incontinent of urine. Worked on bed mobility rolling L and R for total A peri-care and brief change. Encouraged pt to try to pull up pants using hip bridging, but had difficulty bridging requiring rolling. Pt completed bed mobility with mod A. Pt with truncal ataxia in sitting, but able to get to CGA/supervision for sitting balance. While sitting EOB, OT had pt reach to midline to get wash cloth, then was able to bring hand to wash face. Worked on sit<>stands at EOB with mod A +2. Worked on taking side steps along EOB With HHA. Multiple rest breaks and facilitation to decrease posterior lean. Pt returned to bed at with mod A. Alarm belt on and nurse tech present and needs met.   Therapy Documentation Precautions:  Precautions Precautions: Fall Precaution Comments: bilateral mitts and bedrails padded Restrictions Weight Bearing Restrictions: No Pain:   denies pain   Therapy/Group: Individual Therapy  [name] 09/27/2020, 3:24 PM

## 2020-09-27 NOTE — Progress Notes (Signed)
Physical Therapy Session Note  Patient Details  Name: Cindy [name] MRN: [id number] Date of Birth: Sep 09, 1960  Today's Date: 09/27/2020 PT Individual Time: 0803-0829 PT Individual Time Calculation (min): 26 min   Short Term Goals: Week 4:  PT Short Term Goal 1 (Week 4): STG=LTG, patient is pending SNF [place]em[place].  Skilled Therapeutic Interventions/Progress Updates:     Pt received supine in bed asleep. Easily roused and agreeable to therapy. Supine to sit with modA. PT threads pt's pants over each leg. Pt stands with maxA and PT provides totalA to pull up pants. MaxA for transfer to [facility]. WC transport to gym for time management. Pt performs sit to stand with modA +2, the ambulates x50' with 3 musketeers technique and modA +2. Pt initiates swing phase for each leg, but has forward flexed posture and very limited knee flexion in either leg. Following seated rest break, pt performs stand pivot from chair>WC>bed with modA +1. Sit to supine with minA. Left with alarm intact and all needs within reach.  Therapy Documentation Precautions:  Precautions Precautions: Fall Precaution Comments: bilateral mitts and bedrails padded Restrictions Weight Bearing Restrictions: No   Therapy/Group: Individual Therapy  Beau Fanny, PT, DPT 09/27/2020, 5:07 PM

## 2020-09-28 NOTE — Progress Notes (Signed)
Patient ID: Cindy [name], female   DOB: Dec 18, 1960, 60 y.o.   MRN: [id number]  SW [phone]) to follow-up about status. She reported she is still waiting on an answer from corporate and sometimes it can take up to two weeks before there is an answer. States she will reach out to them again and follow-up with SW once there is an answer.   [name], MSW, LCSWA Office: [phone] Cell: [phone] Fax: [phone]

## 2020-09-28 NOTE — Progress Notes (Signed)
Physical Therapy Session Note  Patient Details  Name: Cindy [name] MRN: [id number] Date of Birth: 31-Oct-1960  Today's Date: 09/28/2020 PT Individual Time: 1035-1100 PT Individual Time Calculation (min): 25 min   Short Term Goals: Week 4:  PT Short Term Goal 1 (Week 4): STG=LTG, patient is pending SNF placem[place].  Skilled Therapeutic Interventions/Progress Updates:     Pt received supine in bed and agrees to therapy. Reports some discomfort in stomach. Number not provided. PT provides rest breaks and mobility to manage pain. Supine to sit with modA. PT threads pt's pants on each leg at EOB. Sit to stand with modA. Pt attempts to pull her pants up but requires some assistance to do so effectively. Stand pivot to tilt in space WC with modA +1. WC transport to gym for time management. Pt performs sit to stand to EVA walker with modA +1, then ambulates x100' with modA +1 and +2 WC follow for safety. Pt has forward flexed posture requiring manual assistance at hips to encourage extension. With consistent verbal, tactile, and visual cues (via mirror), pt is able to improve posture and gait mechanics. Pt verbalizes "that felt good" after ambulating. Pt left seated in WC at RN station with alarm intact.  Therapy Documentation Precautions:  Precautions Precautions: Fall Precaution Comments: bilateral mitts and bedrails padded Restrictions Weight Bearing Restrictions: No   Therapy/Group: Individual Therapy  Beau Fanny, PT, DPT 09/28/2020, 1:49 PM

## 2020-09-28 NOTE — Progress Notes (Signed)
Occupational Therapy Weekly Progress Note  Patient Details  Name: Cindy [name] MRN: [id number] Date of Birth: 06/14/1960  Beginning of progress report period: Sep 21, 2020 End of progress report period: Sep 28, 2020  Today's Date: 09/28/2020 OT Individual Time: 1415-1500 OT Individual Time Calculation (min): 45 min    Patient has met 3 of 3 short term goals.  Pt has made significnat improvement in attention, arousal, agitation, OOB participation and participation of in [place] tasks. Pt continues to require MAX-total A for all tasks, however grooming at sink (hand hygiene/oral care/hair washing) pt able to sequence with cuing. Pt continues to demo profound ataxia, cognitive and visual perceptual deficits impacting functional performance of BADls. Continue to recommend SNF placement   Patient continues to demonstrate the following deficits: muscle weakness, decreased cardiorespiratoy endurance, impaired timing and sequencing, abnormal tone, unbalanced muscle activation, motor apraxia, ataxia, decreased coordination and decreased motor planning, decreased visual perceptual skills and decreased visual motor skills, decreased motor planning, decreased initiation, decreased attention, decreased awareness, decreased problem solving, decreased safety awareness, decreased memory and delayed processing and decreased sitting balance, decreased standing balance, decreased postural control, hemiplegia and decreased balance strategies and therefore will continue to benefit from skilled OT intervention to enhance overall performance with BADL.  Patient progressing toward long term goals..  Continue plan of care.  OT Short Term Goals Week 4:  OT Short Term Goal 1 (Week 4): Pt will self feed 25% of meal wiht MOD HOH A OT Short Term Goal 1 - Progress (Week 4): Met OT Short Term Goal 2 (Week 4): Pt will intiate automatic task wiht mod multimodal cuing at sink OT Short Term Goal 2 - Progress (Week 4): Met OT Short  Term Goal 3 (Week 4): Pt iwll tolerate OOB activity 1x per day. OT Short Term Goal 3 - Progress (Week 4): Met Week 5:  OT Short Term Goal 1 (Week 5): Pt will tolerate sitting OOB in TIS at RN station 1x per day to improve OOB tolerance OT Short Term Goal 2 (Week 5): Pt will complete toilet transfer with MOD A OT Short Term Goal 3 (Week 5): Pt will don shirt wiht MAX A OT Short Term Goal 4 (Week 5): Pt will bathe UB with MOD A for thoroughness  Skilled Therapeutic Interventions/Progress Updates:    1;1. Pt received in bed very alert and able to answer questions/biographical information and make requests based on needs! Pt requires MAX A for all  mobility via stand pivot trasnfers and attempted mobility in hallway. Pt with significant ataxia with all  movements of limbs as well as trunk. Pt completes hair washing at sink seated but requires VC and intervention for termiantion of hair washing/drying with a towel.HOH A for combing after  Therapy Documentation Precautions:  Precautions Precautions: Fall Precaution Comments: bilateral mitts and bedrails padded Restrictions Weight Bearing Restrictions: No General:   Vital Signs: Therapy Vitals Temp: 97.7 F (36.5 C) Temp Source: Oral Pulse Rate: 77 Resp: 16 BP: 122/79 Patient Position (if appropriate): Lying Oxygen Therapy SpO2: (!) 87 % O2 Device: Room Air Pain:   ADL: ADL Eating: Unable to assess Grooming: Unable to assess Upper Body Bathing: Unable to assess Lower Body Bathing: Unable to assess Upper Body Dressing: Unable to assess Lower Body Dressing: Dependent Where Assessed-Lower Body Dressing: Bed level Toileting: Unable to assess Toilet Transfer: Unable to assess Tub/Shower Transfer: Unable to assess Social research officer, government: Unable to assess Glass blower/designer  Exercises:   Other Treatments:     Therapy/Group: Individual Therapy  Tonny Branch 09/28/2020, 4:12 PM

## 2020-09-28 NOTE — Plan of Care (Signed)
  Problem: Consults Goal: RH STROKE PATIENT EDUCATION Description: See Patient Education module for education specifics  Outcome: Progressing   Problem: RH SKIN INTEGRITY Goal: RH STG MAINTAIN SKIN INTEGRITY WITH ASSISTANCE Description: STG Maintain Skin Integrity With min Assistance. Outcome: Progressing   Problem: RH PAIN MANAGEMENT Goal: RH STG PAIN MANAGED AT OR BELOW PT'S PAIN GOAL Description: <3 on a 0-10 pain scale. Outcome: Progressing

## 2020-09-28 NOTE — Progress Notes (Signed)
Speech Language Pathology Weekly Progress and Session Note  Patient Details  Name: Cindy [name] MRN: [id number] Date of Birth: 09/24/1960  Beginning of progress report period: Sep 20, 2020 End of progress report period: Sep 28, 2020  Today's Date: 09/28/2020 SLP Individual Time: 1133-1200 SLP Individual Time Calculation (min): 27 min  Short Term Goals: Week 4: SLP Short Term Goal 1 (Week 4): Patient will focus attention to verbal or tactile stimuli in 25% of opportunities with Max A multimodal cues. SLP Short Term Goal 1 - Progress (Week 4): Met SLP Short Term Goal 2 (Week 4): Patient will respond to basic yes/no questions in 25% of opportunities with Max A multimodal cues. SLP Short Term Goal 2 - Progress (Week 4): Met SLP Short Term Goal 3 (Week 4): Patient will follow 1-step commands in 25% of opportunities with Max A multimodal cues. SLP Short Term Goal 3 - Progress (Week 4): Met SLP Short Term Goal 4 (Week 4): Patient will be ~50% intelligibile at the word level with Max A multimodal cues. SLP Short Term Goal 4 - Progress (Week 4): Met SLP Short Term Goal 5 (Week 4): Patient will consume current diet of Dys. 1 textures with thin liquids with minimal overt s/s of aspiration and Mod A multimodal cues for use of swallowing compensatory strategies. SLP Short Term Goal 5 - Progress (Week 4): Met    New Short Term Goals: Week 5: SLP Short Term Goal 1 (Week 5): Patient will focus attention to verbal or tactile stimuli in 50% of opportunities with Mod A multimodal cues. SLP Short Term Goal 2 (Week 5): Patient will respond to basic yes/no questions in 50% of opportunities with Mod A multimodal cues. SLP Short Term Goal 3 (Week 5): Patient will follow 1-step commands in 50% of opportunities with Mod A multimodal cues. SLP Short Term Goal 4 (Week 5): Patient will be ~75% intelligibile at the word level with Mod A multimodal cues. SLP Short Term Goal 5 (Week 5): Patient will consume trials pf  Dys. 2 textures with minimal overt s/s of aspiration and Mod A multimodal cues for use of swallowing compensatory strategies.  Weekly Progress Updates: Pt making functional gains this week as a result of increased alertness and participation and has met 5 out of 5 STGs this reporting period. Pt continues to have significant fluctuation between severe lethargy and functional alertness, however is appropriate for upgraded goals at this time. Pt speech intelligibility is impacted by edentulous status, but emergent awareness and correction of communication breakdown occurs. Pt is tolerating Dys 1/thin diet with no overt s/s aspiration and appears ready to trial more advanced textures if alertness and ability to follow commands is maintained. Pt is awaiting SNF placement, recommend continued ST services at discharge location. Pt and family education is ongoing. Pt would benefit from continued SLP intervention to maximize her cognitive, swallowing and speech function prior to discharge.   Intensity: Minumum of 1-2 x/day, 30 to 90 minutes Frequency: 3 to 5 out of 7 days Duration/Length of Stay: waiting SNF placement Treatment/Interventions: Cognitive remediation/compensation;Speech/Language facilitation;Therapeutic Activities;Therapeutic Exercise;Dysphagia/aspiration precaution training;Patient/family education;Environmental controls;Cueing hierarchy;Functional tasks   Daily Session  Skilled Therapeutic Interventions: Skilled ST treatment focused on cognitive and dysphagia goals, husband present for majority of tx session. Pt sitting upright in wheelchair, very alert, engaged and participatory. Pt answering biographical questions with 75% accuracy, unable to state correct age despite multiple re-education from spouse and SLP. Pt able to follow simple 1-step directions with 75% accuracy provided  repetition and extra time. Pt verbose during session on appropriate topics, benefits from max A cues for topic  maintenance. Pt seen with Dys 1/thin snack, min A for self-feeding. Pt with apparent timely and functional oral and pharyngeal phase of swallow, no overt s/s aspiration or change in vocal quality. Spoke with NT about bringing patient in w/c to nurses station for socialization. Pt left in NT care, cont ST POC.      General    Pain Pain Assessment Pain Scale: 0-10 Pain Score: 0-No pain Faces Pain Scale: No hurt  Therapy/Group: Individual Therapy  Dewaine Conger 09/28/2020, 12:07 PM

## 2020-09-28 NOTE — Progress Notes (Signed)
PROGRESS NOTE   Subjective/Complaints: Pt sitting up in bed. No new problems reported.   ROS: Limited due to cognitive/behavioral   Objective:   No results found. No results for input(s): WBC, HGB, HCT, PLT in the last 72 hours. No results for input(s): NA, K, CL, CO2, GLUCOSE, BUN, CREATININE, CALCIUM in the last 72 hours.  Intake/Output Summary (Last 24 hours) at 09/28/2020 1125 Last data filed at 09/28/2020 0800 Gross per 24 hour  Intake 278 ml  Output --  Net 278 ml        Physical Exam: Vital Signs Blood pressure 139/88, pulse 60, temperature 97.6 F (36.4 C), resp. rate 16, height 5\' 7"  (1.702 m), weight 58.1 kg, SpO2 100 %.  Constitutional: No distress . Vital signs reviewed. HEENT: EOMI, oral membranes moist Neck: supple Cardiovascular: RRR without murmur. No JVD    Respiratory/Chest: CTA Bilaterally without wheezes or rales. Normal effort    GI/Abdomen: BS +, non-tender, non-distended Ext: no clubbing, cyanosis, or edema Psych: confused, distracted. Non-agitated  Neuro: alert, follows simple commands.  moves all 4's, confused.     Assessment/Plan: 1. Functional deficits which require 3+ hours per day of interdisciplinary therapy in a comprehensive inpatient rehab setting.  Physiatrist is providing close team supervision and 24 hour management of active medical problems listed below.  Physiatrist and rehab team continue to assess barriers to discharge/monitor patient progress toward functional and medical goals  Care Tool:  Bathing  Bathing activity did not occur: Safety/medical concerns Body parts bathed by patient: Face   Body parts bathed by helper: Right arm,Left arm,Chest,Abdomen,Front perineal area,Buttocks,Right upper leg,Left upper leg,Left lower leg,Right lower leg,Face Body parts n/a: Left lower leg,Left upper leg,Right upper leg,Right lower leg,Buttocks,Abdomen,Front perineal area,Left  arm,Right arm,Chest   Bathing assist Assist Level: Total Assistance - Patient < 25%     Upper Body Dressing/Undressing Upper body dressing Upper body dressing/undressing activity did not occur (including orthotics): Safety/medical concerns What is the patient wearing?: Pull over shirt    Upper body assist Assist Level: Total Assistance - Patient < 25%    Lower Body Dressing/Undressing Lower body dressing      What is the patient wearing?: Incontinence brief,Pants     Lower body assist Assist for lower body dressing: Total Assistance - Patient < 25%     Toileting Toileting Toileting Activity did not occur (Clothing management and hygiene only): N/A (no void or bm)  Toileting assist Assist for toileting: Total Assistance - Patient < 25%     Transfers Chair/bed transfer  Transfers assist  Chair/bed transfer activity did not occur: Safety/medical concerns  Chair/bed transfer assist level: Maximal Assistance - Patient 25 - 49%     Locomotion Ambulation   Ambulation assist   Ambulation activity did not occur: Safety/medical concerns  Assist level: 2 helpers Assistive device: Hand held assist Max distance: 50'   Walk 10 feet activity   Assist  Walk 10 feet activity did not occur: Safety/medical concerns  Assist level: 2 helpers Assistive device: Hand held assist   Walk 50 feet activity   Assist Walk 50 feet with 2 turns activity did not occur: Safety/medical concerns  Assist level: 2  helpers Assistive device: Hand held assist    Walk 150 feet activity   Assist Walk 150 feet activity did not occur: Safety/medical concerns         Walk 10 feet on uneven surface  activity   Assist Walk 10 feet on uneven surfaces activity did not occur: Safety/medical concerns         Wheelchair     Assist Will patient use wheelchair at discharge?: Yes (pt may benefit from [facility] assessment however unsafe to attempt this AM; per PT long term goals)    Wheelchair activity did not occur: Safety/medical concerns         Wheelchair 50 feet with 2 turns activity    Assist    Wheelchair 50 feet with 2 turns activity did not occur: Safety/medical concerns       Wheelchair 150 feet activity     Assist  Wheelchair 150 feet activity did not occur: Safety/medical concerns       Blood pressure 139/88, pulse 60, temperature 97.6 F (36.4 C), resp. rate 16, height 5\' 7"  (1.702 m), weight 58.1 kg, SpO2 100 %.  Medical Problem List and Plan: 1.  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             -patient may shower             - SNF placement now pending.            -Continue CIR therapies including PT, OT, and SLP    - Advanced WM changes on MRI 4/12 likely related to SVD. Pt + for cocaine on admit.      2.  Impaired mobility -DVT/anticoagulation: Continue Lovenox             -antiplatelet therapy: Aspirin 325 mg 3. Pain Management: Continue Tylenol as needed 4. Agitation/psychosis: Antipsychotic: Continue zyprexa 5mg  hs and 2.5mg  daily  - Continue bilateral mittens as needed  -depakote 750mg  tid   -level was 94 on Monday   -reduced to 500mg  bid  -melatonin 3mg  HS  -5/13 agitation improved    -limit prn haldol   5. Neuropsych: This patient is capable of making decisions on her own behalf. 6. Skin/Wound Care: Routine skin checks 7. Fluids/Electrolytes/Nutrition:     Filed Weights   09/26/20 0605 09/27/20 0514 09/28/20 0407  Weight: 55.5 kg 55.8 kg 58.1 kg    5/6-->31/1.23 5/9  -will dc HS IVF and check bmet monday  -weights are trending up 8.  Seizure disorder.  Vimpat 200 mg twice daily, valproic acid 1000 mg twice daily.  EEG negative 9.  Hypertension.  Norvasc 10 mg daily     5/12 bp under reasonable control.  continue lopressor 25mg  bid 10.  History of bilateral cerebral convexity infarction frontal region.   CIR 08/19/2019-08/27/2019 11.  Hyperlipidemia.  Continue Lipitor 12.  History of polysubstance abuse.  Urine drug screen positive cocaine.   13. AKI: any increases in Cr seem to be prerenal   14. UTI: UCX never performed.     -f/u urine cx negative 15. Dysphagia:     -4/19 diet downgraded to D1, thins   -intake remains inconsistent, needs full supervision  LOS: 28 days A FACE TO FACE EVALUATION WAS PERFORMED  7/9 09/28/2020, 11:25 AM

## 2020-09-29 NOTE — Progress Notes (Signed)
PROGRESS NOTE   Subjective/Complaints: Remains confused, sleeping but awakens to voice, states she is not hungry for lunch  ROS: Limited due to cognitive/behavioral   Objective:   No results found. No results for input(s): WBC, HGB, HCT, PLT in the last 72 hours. No results for input(s): NA, K, CL, CO2, GLUCOSE, BUN, CREATININE, CALCIUM in the last 72 hours.  Intake/Output Summary (Last 24 hours) at 09/29/2020 1330 Last data filed at 09/29/2020 0838 Gross per 24 hour  Intake 236 ml  Output --  Net 236 ml        Physical Exam: Vital Signs Blood pressure 127/76, pulse 65, temperature 98.1 F (36.7 C), resp. rate 18, height 5\' 7"  (1.702 m), weight 58.3 kg, SpO2 99 %.   General: No acute distress Mood and affect are appropriate Heart: Regular rate and rhythm no rubs murmurs or extra sounds Lungs: Clear to auscultation, breathing unlabored, no rales or wheezes Abdomen: Positive bowel sounds, soft nontender to palpation, nondistended Extremities: No clubbing, cyanosis, or edema Skin: No evidence of breakdown, no evidence of rash Neurologic: Oriented to self, once awake and tries to get out of bed, requires restraints for safety Does not cooperate with manual muscle testing but moves upper and lower extremities bilaterally.  Neuro: alert, follows simple commands.  moves all 4's, confused.     Assessment/Plan: 1. Functional deficits which require 3+ hours per day of interdisciplinary therapy in a comprehensive inpatient rehab setting.  Physiatrist is providing close team supervision and 24 hour management of active medical problems listed below.  Physiatrist and rehab team continue to assess barriers to discharge/monitor patient progress toward functional and medical goals  Care Tool:  Bathing  Bathing activity did not occur: Safety/medical concerns Body parts bathed by patient: Face   Body parts bathed by helper:  Right arm,Left arm,Chest,Abdomen,Front perineal area,Buttocks,Right upper leg,Left upper leg,Left lower leg,Right lower leg,Face Body parts n/a: Left lower leg,Left upper leg,Right upper leg,Right lower leg,Buttocks,Abdomen,Front perineal area,Left arm,Right arm,Chest   Bathing assist Assist Level: Total Assistance - Patient < 25%     Upper Body Dressing/Undressing Upper body dressing Upper body dressing/undressing activity did not occur (including orthotics): Safety/medical concerns What is the patient wearing?: Pull over shirt    Upper body assist Assist Level: Total Assistance - Patient < 25%    Lower Body Dressing/Undressing Lower body dressing      What is the patient wearing?: Incontinence brief,Pants     Lower body assist Assist for lower body dressing: Total Assistance - Patient < 25%     Toileting Toileting Toileting Activity did not occur (Clothing management and hygiene only): N/A (no void or bm)  Toileting assist Assist for toileting: Total Assistance - Patient < 25%     Transfers Chair/bed transfer  Transfers assist  Chair/bed transfer activity did not occur: Safety/medical concerns  Chair/bed transfer assist level: Moderate Assistance - Patient 50 - 74%     Locomotion Ambulation   Ambulation assist   Ambulation activity did not occur: Safety/medical concerns  Assist level: 2 helpers Assistive device: Max distance: 100'   Walk 10 feet activity   Assist  Walk 10 feet activity did not  occur: Safety/medical concerns  Assist level: 2 helpers Assistive device: Walker-Eva   Walk 50 feet activity   Assist Walk 50 feet with 2 turns activity did not occur: Safety/medical concerns  Assist level: 2 helpers Assistive device: Walker-Eva    Walk 150 feet activity   Assist Walk 150 feet activity did not occur: Safety/medical concerns         Walk 10 feet on uneven surface  activity   Assist Walk 10 feet on uneven surfaces  activity did not occur: Safety/medical concerns         Wheelchair     Assist Will patient use wheelchair at discharge?: Yes (pt may benefit from [facility] assessment however unsafe to attempt this AM; per PT long term goals)   Wheelchair activity did not occur: Safety/medical concerns         Wheelchair 50 feet with 2 turns activity    Assist    Wheelchair 50 feet with 2 turns activity did not occur: Safety/medical concerns       Wheelchair 150 feet activity     Assist  Wheelchair 150 feet activity did not occur: Safety/medical concerns       Blood pressure 127/76, pulse 65, temperature 98.1 F (36.7 C), resp. rate 18, height 5\' 7"  (1.702 m), weight 58.3 kg, SpO2 99 %.  Medical Problem List and Plan: 1.  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             -patient may shower             - SNF placement now pending.            -Continue CIR therapies including PT, OT, and SLP    - Advanced WM changes on MRI 4/12 likely related to SVD. Pt + for cocaine on admit.      2.  Impaired mobility -DVT/anticoagulation: Continue Lovenox             -antiplatelet therapy: Aspirin 325 mg 3. Pain Management: Continue Tylenol as needed 4. Agitation/psychosis: Antipsychotic: Continue zyprexa 5mg  hs and 2.5mg  daily  - Continue bilateral mittens as needed  -depakote 750mg  tid   -level was 94 on Monday   -reduced to 500mg  bid  -melatonin 3mg  HS  -5/13 agitation improved    -limit prn haldol   5. Neuropsych: This patient is capable of making decisions on her own behalf. 6. Skin/Wound Care: Routine skin checks 7. Fluids/Electrolytes/Nutrition:     Filed Weights   09/27/20 0514 09/28/20 0407 09/29/20 0426  Weight: 55.8 kg 58.1 kg 58.3 kg    5/6-->31/1.23 5/9  -will dc HS IVF and check bmet monday  -weights are trending up but stable over the last couple days 8.  Seizure  disorder.  Vimpat 200 mg twice daily, valproic acid 1000 mg twice daily.  EEG negative 9.  Hypertension.  Norvasc 10 mg daily     5/12 bp under reasonable control.  continue lopressor 25mg  bid 10.  History of bilateral cerebral convexity infarction frontal region.  CIR 08/19/2019-08/27/2019 11.  Hyperlipidemia.  Continue Lipitor 12.  History of polysubstance abuse.  Urine drug screen positive cocaine.   13. AKI: any increases in Cr seem to be prerenal   14. UTI: UCX never performed.     -f/u urine cx negative 15. Dysphagia:     -4/19 diet downgraded to D1, thins   -intake remains inconsistent, needs full supervision  LOS: 29 days A FACE TO FACE EVALUATION WAS PERFORMED  Erick Colace 09/29/2020, 1:30 PM

## 2020-09-29 NOTE — Progress Notes (Signed)
Occupational Therapy Session Note  Patient Details  Name: Cindy [name] MRN: [id number] Date of Birth: [birth year]  Today's Date: 09/29/2020 OT Individual Time: 0383-3383 OT Individual Time Calculation (min): 55 min    Short Term Goals: Week 1:  OT Short Term Goal 1 (Week 1): Pt will demonstrate improved attention to grooming task for >1 min with mod A. OT Short Term Goal 1 - Progress (Week 1): Not met OT Short Term Goal 2 (Week 1): Pt will tolerate sitting EOB with mod A for >2 min. OT Short Term Goal 2 - Progress (Week 1): Met OT Short Term Goal 3 (Week 1): Pt will don shirt max A. OT Short Term Goal 3 - Progress (Week 1): Not met OT Short Term Goal 4 (Week 1): Pt will self-feed 25% of meal with mod A. OT Short Term Goal 4 - Progress (Week 1): Not met  Skilled Therapeutic Interventions/Progress Updates:    1:1. Pt received in bed asleep turned sideways in bed. Per NT, pt did better up in w/c yesterday and when returned to bed that was when pt restless therefore at end of session left up in chair at RN station for supervision. Pt requires total A for brief change/pants donning with increased time to execute bed mobility/bridging for clothing managment. Pt able to complete peri care when rag placed over peri area. Pt completes stand pivot transfer with MAX A holding onto OT. Pt able to self feed 75% of meal with MOD HOH A to scoop and MIN A overall for hand to mouth excursion (occasional S). MOD A to change pull over shirt and built up handle placed on comb with significant improvement holding onto comb. Exited session with pt seated in TIS, exit alarm on and call light in reach   Therapy Documentation Precautions:  Precautions Precautions: Fall Precaution Comments: bilateral mitts and bedrails padded Restrictions Weight Bearing Restrictions: No General:   Vital Signs: Therapy Vitals Temp: 98.1 F (36.7 C) Pulse Rate: 65 Resp: 18 BP: 127/76 Patient Position (if appropriate):  Lying Oxygen Therapy SpO2: 99 % O2 Device: Room Air Pain:   ADL: ADL Eating: Unable to assess Grooming: Unable to assess Upper Body Bathing: Unable to assess Lower Body Bathing: Unable to assess Upper Body Dressing: Unable to assess Lower Body Dressing: Dependent Where Assessed-Lower Body Dressing: Bed level Toileting: Unable to assess Toilet Transfer: Unable to assess Tub/Shower Transfer: Unable to assess Gaffer Transfer: Unable to assess Vision   Perception    Praxis   Exercises:   Other Treatments:     Therapy/Group: Individual Therapy  Tonny Branch 09/29/2020, 6:45 AM

## 2020-09-29 NOTE — Plan of Care (Signed)
  Problem: Consults Goal: RH STROKE PATIENT EDUCATION Description: See Patient Education module for education specifics  Outcome: Progressing   Problem: RH SKIN INTEGRITY Goal: RH STG MAINTAIN SKIN INTEGRITY WITH ASSISTANCE Description: STG Maintain Skin Integrity With min Assistance. Outcome: Progressing Goal: RH STG ABLE TO PERFORM INCISION/WOUND CARE W/ASSISTANCE Description: STG Able To Perform Incision/Wound Care With min Assistance. Outcome: Progressing   Problem: RH SAFETY Goal: RH STG ADHERE TO SAFETY PRECAUTIONS W/ASSISTANCE/DEVICE Description: STG Adhere to Safety Precautions With min Assistance/Device. Outcome: Progressing Goal: RH STG DECREASED RISK OF FALL WITH ASSISTANCE Description: STG Decreased Risk of Fall With min Assistance. Outcome: Progressing   Problem: RH PAIN MANAGEMENT Goal: RH STG PAIN MANAGED AT OR BELOW PT'S PAIN GOAL Description: <3 on a 0-10 pain scale. Outcome: Progressing   Problem: RH BOWEL ELIMINATION Goal: RH STG MANAGE BOWEL WITH ASSISTANCE Description: STG Manage Bowel with min Assistance. Outcome: Not Progressing Goal: RH STG MANAGE BOWEL W/MEDICATION W/ASSISTANCE Description: STG Manage Bowel with Medication with min Assistance. Outcome: Not Progressing   Problem: RH BLADDER ELIMINATION Goal: RH STG MANAGE BLADDER WITH ASSISTANCE Description: STG Manage Bladder With min Assistance Outcome: Not Progressing

## 2020-10-01 LAB — BASIC METABOLIC PANEL
Anion gap: 7 (ref 5–15)
BUN: 23 mg/dL (ref 8–23)
CO2: 28 mmol/L (ref 22–32)
Calcium: 9.1 mg/dL (ref 8.9–10.3)
Chloride: 100 mmol/L (ref 98–111)
Creatinine, Ser: 1.2 mg/dL — ABNORMAL HIGH (ref 0.44–1.00)
GFR, Estimated: 52 mL/min — ABNORMAL LOW (ref >60)
Glucose, Bld: 118 mg/dL — ABNORMAL HIGH (ref 70–99)
Potassium: 3.8 mmol/L (ref 3.5–5.1)
Sodium: 135 mmol/L (ref 135–145)

## 2020-10-01 NOTE — Progress Notes (Signed)
Physical Therapy Session Note  Patient Details  Name: Cindy [name] MRN: [id number] Date of Birth: [birth year]  Today's Date: 10/01/2020 PT Individual Time: 1024-1050 PT Individual Time Calculation (min): 26 min   Short Term Goals: Week 4:  PT Short Term Goal 1 (Week 4): STG=LTG, patient is pending SNF placemet.  Skilled Therapeutic Interventions/Progress Updates:     Patient in bed asleep upon PT arrival. Patient aroused to tactile stimulation and was alert throughout session. Patient without pain indicators throughout session. Patient more participitory with automatic   Therapeutic Activity: Bed Mobility: Patient performed supine to sit with min A and increased time for initiation and motor planning. Provided direct verbal cues for sitting EOB with bed flat and without use of bed rails. Patient reached for assistance and followed cues and hand-over-hand to push herself up from her elbows. Transfers: Patient donned pants with max A for threading lower extremities and sitting and pulling up in standing. She performed sit to/from stand x2 with mod A while pulling pants up without AD and mod A using RW. Provided cues for forward weight shift and hand placement on RN. She performed stand pivot bed>TIS w/c with mod A and TIS w/c<>BSC over the toilet with mod-max A due to increased time and impulsive sitting during clothing managment. Provided verbal cues for sequencing and maintaining standing for total A with peri-care and clothing management, assist provided by a second person. Patient was continent of bladder during toileting.  Patient sat at the sink and washed her hands with mod A for sequencing, locating/oporating faucet, soap, and towels. Provided patient with built-up comb for improved grasp. Patient combed her hair with supervision with cues for combing the back of her hair. PT followed up for improved styling. Patient donned glasses with set-up assist.   Gait Training:  Patient ambulated  20 feet using RW with mod A +2 for stabilizing RW and w/c follow for safety. Ambulated with narrow BOS, significant forward trunk lean, decreased knee flexion and foot clearance resulting in shuffling gait pattern, and poor safety awareness with RW. Provided verbal cues for proximity to RW, increased step height, facilitation for weight shift and hip extension, and +2 assist for AD management.  Patient transported to nurses station in TIS w/c at end of session with breaks locked, seat belt alarm set, and all needs within reach. Desk [employer] operational officer and LPN made aware and agreeable to return patient to her room with mod A +2 for patient safety.    Therapy Documentation Precautions:  Precautions Precautions: Fall Precaution Comments: bilateral mitts and bedrails padded Restrictions Weight Bearing Restrictions: No   Therapy/Group: Individual Therapy  Moyses Pavey L Lui Bellis PT, DPT  10/01/2020, 4:59 PM

## 2020-10-01 NOTE — Progress Notes (Signed)
PROGRESS NOTE   Subjective/Complaints: Confused. No new issues this morning.   ROS: Limited due to cognitive/behavioral   Objective:   No results found. No results for input(s): WBC, HGB, HCT, PLT in the last 72 hours. Recent Labs    10/01/20 0948  NA 135  K 3.8  CL 100  CO2 28  GLUCOSE 118*  BUN 23  CREATININE 1.20*  CALCIUM 9.1    Intake/Output Summary (Last 24 hours) at 10/01/2020 1058 Last data filed at 10/01/2020 0746 Gross per 24 hour  Intake 400 ml  Output --  Net 400 ml        Physical Exam: Vital Signs Blood pressure 104/67, pulse 63, temperature 97.9 F (36.6 C), resp. rate 17, height 5\' 7"  (1.702 m), weight 55.3 kg, SpO2 97 %.   Constitutional: No distress . Vital signs reviewed. HEENT: EOMI, oral membranes moist Neck: supple Cardiovascular: RRR without murmur. No JVD    Respiratory/Chest: CTA Bilaterally without wheezes or rales. Normal effort    GI/Abdomen: BS +, non-tender, non-distended Ext: no clubbing, cyanosis, or edema Psych: confused, restless Neurologic: Oriented to self, once awake and tries to get out of bed, requires restraints for safety Does not cooperate with manual muscle testing but moves upper and lower extremities bilaterally.  Neuro: alert, follows simple commands.  moves all 4's, confused.     Assessment/Plan: 1. Functional deficits which require 3+ hours per day of interdisciplinary therapy in a comprehensive inpatient rehab setting.  Physiatrist is providing close team supervision and 24 hour management of active medical problems listed below.  Physiatrist and rehab team continue to assess barriers to discharge/monitor patient progress toward functional and medical goals  Care Tool:  Bathing  Bathing activity did not occur: Safety/medical concerns Body parts bathed by patient: Face   Body parts bathed by helper: Right arm,Left arm,Chest,Abdomen,Front perineal  area,Buttocks,Right upper leg,Left upper leg,Left lower leg,Right lower leg,Face Body parts n/a: Left lower leg,Left upper leg,Right upper leg,Right lower leg,Buttocks,Abdomen,Front perineal area,Left arm,Right arm,Chest   Bathing assist Assist Level: Total Assistance - Patient < 25%     Upper Body Dressing/Undressing Upper body dressing Upper body dressing/undressing activity did not occur (including orthotics): Safety/medical concerns What is the patient wearing?: Pull over shirt    Upper body assist Assist Level: Total Assistance - Patient < 25%    Lower Body Dressing/Undressing Lower body dressing      What is the patient wearing?: Incontinence brief,Pants     Lower body assist Assist for lower body dressing: Total Assistance - Patient < 25%     Toileting Toileting Toileting Activity did not occur (Clothing management and hygiene only): N/A (no void or bm)  Toileting assist Assist for toileting: Total Assistance - Patient < 25%     Transfers Chair/bed transfer  Transfers assist  Chair/bed transfer activity did not occur: Safety/medical concerns  Chair/bed transfer assist level: Moderate Assistance - Patient 50 - 74%     Locomotion Ambulation   Ambulation assist   Ambulation activity did not occur: Safety/medical concerns  Assist level: 2 helpers Assistive device: Max distance: 100'   Walk 10 feet activity   Assist  Walk 10  feet activity did not occur: Safety/medical concerns  Assist level: 2 helpers Assistive device: Walker-Eva   Walk 50 feet activity   Assist Walk 50 feet with 2 turns activity did not occur: Safety/medical concerns  Assist level: 2 helpers Assistive device: Walker-Eva    Walk 150 feet activity   Assist Walk 150 feet activity did not occur: Safety/medical concerns         Walk 10 feet on uneven surface  activity   Assist Walk 10 feet on uneven surfaces activity did not occur: Safety/medical concerns          Wheelchair     Assist Will patient use wheelchair at discharge?: Yes (pt may benefit from [facility] assessment however unsafe to attempt this AM; per PT long term goals)   Wheelchair activity did not occur: Safety/medical concerns         Wheelchair 50 feet with 2 turns activity    Assist    Wheelchair 50 feet with 2 turns activity did not occur: Safety/medical concerns       Wheelchair 150 feet activity     Assist  Wheelchair 150 feet activity did not occur: Safety/medical concerns       Blood pressure 104/67, pulse 63, temperature 97.9 F (36.6 C), resp. rate 17, height 5\' 7"  (1.702 m), weight 55.3 kg, SpO2 97 %.  Medical Problem List and Plan: 1.  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             -patient may shower             - SNF placement pending.           -Continue CIR therapies including PT, OT, and SLP   - Advanced WM changes on MRI 4/12 likely related to SVD. Pt + for cocaine on admit.      2.  Impaired mobility -DVT/anticoagulation: Continue Lovenox             -antiplatelet therapy: Aspirin 325 mg 3. Pain Management: Continue Tylenol as needed 4. Agitation/psychosis: Antipsychotic: Continue zyprexa 5mg  hs and 2.5mg  daily  - Continue bilateral mittens as needed  -depakote 750mg  tid   -level was 94 on Monday   -reduced to 500mg  bid  -melatonin 3mg  HS  -5/16 agitation better    -limit prn haldol   5. Neuropsych: This patient is capable of making decisions on her own behalf. 6. Skin/Wound Care: Routine skin checks 7. Fluids/Electrolytes/Nutrition:     Filed Weights   09/28/20 0407 09/29/20 0426 09/30/20 0547  Weight: 58.1 kg 58.3 kg 55.3 kg    Weights generally trending up  -I personally reviewed all of the patient's labs today, and lab work is nearly normal.   8.  Seizure disorder.  Vimpat 200 mg twice daily, valproic acid 1000 mg  twice daily.  EEG negative 9.  Hypertension.  Norvasc 10 mg daily     5/16 bp under   control.  continue lopressor 25mg  bid 10.  History of bilateral cerebral convexity infarction frontal region.  CIR 08/19/2019-08/27/2019 11.  Hyperlipidemia.  Continue Lipitor 12.  History of polysubstance abuse.  Urine drug screen positive cocaine.   13. AKI: any increases in Cr seem to be prerenal   14. UTI: UCX never performed.     -f/u urine cx negative 15. Dysphagia:     -D1, thins   -intake remains inconsistent, but generally better  LOS: 31  days A FACE TO FACE EVALUATION WAS PERFORMED  Ranelle Oyster 10/01/2020, 10:58 AM

## 2020-10-01 NOTE — Progress Notes (Signed)
Speech Language Pathology Daily Session Note  Patient Details  Name: Cindy [name] MRN: [id number] Date of Birth: [birth year]  Today's Date: 10/01/2020 SLP Individual Time: 1430-1500 SLP Individual Time Calculation (min): 30 min  Short Term Goals: Week 5: SLP Short Term Goal 1 (Week 5): Patient will focus attention to verbal or tactile stimuli in 50% of opportunities with Mod A multimodal cues. SLP Short Term Goal 2 (Week 5): Patient will respond to basic yes/no questions in 50% of opportunities with Mod A multimodal cues. SLP Short Term Goal 3 (Week 5): Patient will follow 1-step commands in 50% of opportunities with Mod A multimodal cues. SLP Short Term Goal 4 (Week 5): Patient will be ~75% intelligibile at the word level with Mod A multimodal cues. SLP Short Term Goal 5 (Week 5): Patient will consume trials pf Dys. 2 textures with minimal overt s/s of aspiration and Mod A multimodal cues for use of swallowing compensatory strategies.  Skilled Therapeutic Interventions: Skilled SLP intervention focused on dysphagia and cognition. Pt able to wake up and agreeable to ice cream snack. She remained alert throughout entire session and asked several questions unrelated to situation but with increased increased intelligiblity. She consumed 6 oz of ice cream with total assist for feeding. She tolerated well with no overt s/sx of aspiration or penetration. She responded to yes/no questions in context with 75% accuracy. Cont with therapy per plan of care. Increased participation and alertness during this time. Cont with therapy per plan of care.      Pain Pain Assessment Pain Scale: Faces Faces Pain Scale: No hurt  Therapy/Group: Individual Therapy  Carlean Jews Venice Marcucci 10/01/2020, 2:56 PM

## 2020-10-01 NOTE — Progress Notes (Signed)
Occupational Therapy Session Note  Patient Details  Name: Cindy [name] MRN: [id number] Date of Birth: 1961/03/22  Today's Date: 10/01/2020 OT Individual Time: 1300-1320 OT Individual Time Calculation (min): 20 min  and Today's Date: 10/01/2020 OT Missed Time: 10 Minutes Missed Time Reason: Patient fatigue   Short Term Goals: Week 2:  OT Short Term Goal 1 (Week 2): Pt will self feed 25% of meals wiht MOD HOH A OT Short Term Goal 1 - Progress (Week 2): Progressing toward goal OT Short Term Goal 2 (Week 2): Pt will focus attention to clinician when name is called 25% of trials OT Short Term Goal 2 - Progress (Week 2): Progressing toward goal OT Short Term Goal 3 (Week 2): Pt will complete 1 step of UB dressing wiht MOD HOH A OT Short Term Goal 3 - Progress (Week 2): Met OT Short Term Goal 4 (Week 2): Pt will tolerate OOB activity 1x daily OT Short Term Goal 4 - Progress (Week 2): Progressing toward goal Week 3:  OT Short Term Goal 1 (Week 3): Pt will self feed wiht MOD HOH A 25% of meal OT Short Term Goal 1 - Progress (Week 3): Progressing toward goal OT Short Term Goal 2 (Week 3): Pt will tolerate OOB actiivty 1x per day OT Short Term Goal 2 - Progress (Week 3): Progressing toward goal OT Short Term Goal 3 (Week 3): Pt will roll withMOD A and no agitation in prep for self care OT Short Term Goal 3 - Progress (Week 3): Met Week 4:  OT Short Term Goal 1 (Week 4): Pt will self feed 25% of meal wiht MOD HOH A OT Short Term Goal 1 - Progress (Week 4): Met OT Short Term Goal 2 (Week 4): Pt will intiate automatic task wiht mod multimodal cuing at sink OT Short Term Goal 2 - Progress (Week 4): Met OT Short Term Goal 3 (Week 4): Pt iwll tolerate OOB activity 1x per day. OT Short Term Goal 3 - Progress (Week 4): Met  Skilled Therapeutic Interventions/Progress Updates:    Pt greeted at time of session sitting up in TIS with tech already present. Pt noted to have limited verbal communication and  easily agitated throughout despite limited verbal and tactile stimulation. Attempts throughout session for short verbal commands and limited stimuli from therapists and environment. Attempted to have pt wash face at sink level for simple familiar task with pt agitated, eventually allowing hand over hand assist Mod A for face washing. Attempted to engage in self feeding with preferred food items for chocolate pudding to improve self feeding skills but pt sleeping soundly and not easily aroused. Pt set up reclined in TIS alarm on call bell in reach. Missed 10 mins d/t fatigue.   Therapy Documentation Precautions:  Precautions Precautions: Fall Precaution Comments: bilateral mitts and bedrails padded Restrictions Weight Bearing Restrictions: No     Therapy/Group: Individual Therapy  Viona Gilmore 10/01/2020, 12:49 PM

## 2020-10-02 NOTE — Progress Notes (Signed)
Occupational Therapy Progress Note  Patient Details  Name: Cindy [name] MRN: [id number] Date of Birth: [birth year]   Today's Date: 10/02/2020 OT Individual Time: 2984-7308 OT Individual Time Calculation (min): 28 min    Skilled Therapeutic Interventions/Progress Updates:    [name] alert with nursing trying to administer meds. OT assisted with bringing pt into long sitting, then scooting up in bed for better positioning to swallow meds. Pt noted to have been incontinent of urine. Rolling L and R with mod A for total A peri-care and brief change. Pt then completed bed mobility with mod A. Come truncal ataxia initially once seated EOB, then improving to mod A for sitting balance. UB dressing while sitting unsupported with max A, but improved initiation. Max A to thread pant legs, then mod A sit<>stand and initiated trying to pull up pants, although still needed assist to get over hips. Pt returned to bed at end of session and left semi-reclined in bed with bed alarm on, call bell in reach and needs met.\  Therapy Documentation Precautions:  Precautions Precautions: Fall Precaution Comments: bilateral mitts and bedrails padded Restrictions Weight Bearing Restrictions: No Pain: Denies pain  Therapy/Group: Individual Therapy  PRISTINE GLADHILL 10/02/2020, 8:06 AM

## 2020-10-02 NOTE — Progress Notes (Signed)
Upon entering room patient has pulled out IV, this makes six IVs she has pulled out in the last month. Discussed with Jesusita Oka probability of IV team saying they wouldn't place a new one due to risk of infection. Verbal orders obtained from Dan to hold off on placing new IV for nocturnal fluids and team will discuss in the morning.

## 2020-10-02 NOTE — Progress Notes (Signed)
Patient ID: Cindy [name], female   DOB: Jul 13, 1960, 60 y.o.   MRN: [id number]  SW [name][facility] to follow-up about updates from business office. Reports she has not received any updates, and was not in the office yesterday. She will follow-up once there is more information.  [name], MSW, LCSWA Office: [phone] Cell: [phone] Fax: [phone]

## 2020-10-02 NOTE — Progress Notes (Signed)
PROGRESS NOTE   Subjective/Complaints: No new problems. Comfortable, alert in bed  ROS: Limited due to cognitive/behavioral   Objective:   No results found. No results for input(s): WBC, HGB, HCT, PLT in the last 72 hours. Recent Labs    10/01/20 0948  NA 135  K 3.8  CL 100  CO2 28  GLUCOSE 118*  BUN 23  CREATININE 1.20*  CALCIUM 9.1    Intake/Output Summary (Last 24 hours) at 10/02/2020 0951 Last data filed at 10/02/2020 6967 Gross per 24 hour  Intake 150 ml  Output --  Net 150 ml        Physical Exam: Vital Signs Blood pressure 113/64, pulse 62, temperature 98 F (36.7 C), temperature source Oral, resp. rate 18, height 5\' 7"  (1.702 m), weight 55.7 kg, SpO2 95 %.   Constitutional: No distress . Vital signs reviewed. HEENT: EOMI, oral membranes moist Neck: supple Cardiovascular: RRR without murmur. No JVD    Respiratory/Chest: CTA Bilaterally without wheezes or rales. Normal effort    GI/Abdomen: BS +, non-tender, non-distended Ext: no clubbing, cyanosis, or edema Psych: pleasantly confused.  Neurologic: Oriented to self, follows simple commands with repetition and cueing. moves all 4's, confused.     Assessment/Plan: 1. Functional deficits which require 3+ hours per day of interdisciplinary therapy in a comprehensive inpatient rehab setting.  Physiatrist is providing close team supervision and 24 hour management of active medical problems listed below.  Physiatrist and rehab team continue to assess barriers to discharge/monitor patient progress toward functional and medical goals  Care Tool:  Bathing  Bathing activity did not occur: Safety/medical concerns Body parts bathed by patient: Face   Body parts bathed by helper: Right arm,Left arm,Chest,Abdomen,Front perineal area,Buttocks,Right upper leg,Left upper leg,Left lower leg,Right lower leg,Face Body parts n/a: Left lower leg,Left upper  leg,Right upper leg,Right lower leg,Buttocks,Abdomen,Front perineal area,Left arm,Right arm,Chest   Bathing assist Assist Level: Total Assistance - Patient < 25%     Upper Body Dressing/Undressing Upper body dressing Upper body dressing/undressing activity did not occur (including orthotics): Safety/medical concerns What is the patient wearing?: Pull over shirt    Upper body assist Assist Level: Total Assistance - Patient < 25%    Lower Body Dressing/Undressing Lower body dressing      What is the patient wearing?: Incontinence brief,Pants     Lower body assist Assist for lower body dressing: Total Assistance - Patient < 25%     Toileting Toileting Toileting Activity did not occur (Clothing management and hygiene only): N/A (no void or bm)  Toileting assist Assist for toileting: Total Assistance - Patient < 25%     Transfers Chair/bed transfer  Transfers assist  Chair/bed transfer activity did not occur: Safety/medical concerns  Chair/bed transfer assist level: Moderate Assistance - Patient 50 - 74%     Locomotion Ambulation   Ambulation assist   Ambulation activity did not occur: Safety/medical concerns  Assist level: 2 helpers Assistive device: Walker-rolling Max distance: 22 ft   Walk 10 feet activity   Assist  Walk 10 feet activity did not occur: Safety/medical concerns  Assist level: 2 helpers Assistive device: Walker-rolling   Walk 50 feet activity  Assist Walk 50 feet with 2 turns activity did not occur: Safety/medical concerns  Assist level: 2 helpers Assistive device: Walker-Eva    Walk 150 feet activity   Assist Walk 150 feet activity did not occur: Safety/medical concerns         Walk 10 feet on uneven surface  activity   Assist Walk 10 feet on uneven surfaces activity did not occur: Safety/medical concerns         Wheelchair     Assist Will patient use wheelchair at discharge?: Yes (pt may benefit from [facility]  assessment however unsafe to attempt this AM; per PT long term goals)   Wheelchair activity did not occur: Safety/medical concerns         Wheelchair 50 feet with 2 turns activity    Assist    Wheelchair 50 feet with 2 turns activity did not occur: Safety/medical concerns       Wheelchair 150 feet activity     Assist  Wheelchair 150 feet activity did not occur: Safety/medical concerns       Blood pressure 113/64, pulse 62, temperature 98 F (36.7 C), temperature source Oral, resp. rate 18, height 5\' 7"  (1.702 m), weight 55.7 kg, SpO2 95 %.  Medical Problem List and Plan: 1.  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             -patient may shower             - SNF placement pending.          -Continue CIR therapies including PT, OT, and SLP   - Advanced WM changes on MRI 4/12 likely related to SVD. Pt + for cocaine on admit.      2.  Impaired mobility -DVT/anticoagulation: Continue Lovenox             -antiplatelet therapy: Aspirin 325 mg 3. Pain Management: Continue Tylenol as needed 4. Agitation/psychosis: Antipsychotic: Continue zyprexa 5mg  hs and 2.5mg  daily  - Continue bilateral mittens as needed  -depakote 750mg  tid   -level was 94 on Monday   -reduced to 500mg  bid  -melatonin 3mg  HS  -5/17 agitation better    -limit prn haldol   5. Neuropsych: This patient is capable of making decisions on her own behalf. 6. Skin/Wound Care: Routine skin checks 7. Fluids/Electrolytes/Nutrition:     Filed Weights   09/29/20 0426 09/30/20 0547 10/02/20 0353  Weight: 58.3 kg 55.3 kg 55.7 kg    Weights generally trending up      8.  Seizure disorder.  Vimpat 200 mg twice daily, valproic acid 1000 mg twice daily.  EEG negative 9.  Hypertension.  Norvasc 10 mg daily     5/17 bp under   control.  continue lopressor 25mg  bid 10.  History of bilateral cerebral convexity  infarction frontal region.  CIR 08/19/2019-08/27/2019 11.  Hyperlipidemia.  Continue Lipitor 12.  History of polysubstance abuse.  Urine drug screen positive cocaine.   13. AKI: any increases in Cr seem to be prerenal   14. UTI: UCX never performed.     -f/u urine cx negative 15. Dysphagia:     -D1, thins   -intake remains inconsistent, but generally better  LOS: 32 days A FACE TO FACE EVALUATION WAS PERFORMED  10/02/20 10/02/2020, 9:51 AM

## 2020-10-02 NOTE — Progress Notes (Signed)
Physical Therapy Session Note  Patient Details  Name: Cindy [name] MRN: [id number] Date of Birth: [birth year]  Today's Date: 10/02/2020 PT Individual Time: 7782-4235 PT Individual Time Calculation (min): 30 min   Short Term Goals: Week 4:  PT Short Term Goal 1 (Week 4): STG=LTG, patient is pending SNF [place].  Skilled Therapeutic Interventions/Progress Updates:     Patient in bed asleep upon PT arrival. Patient difficult to arouse, but breathing evenly, aroused to initiation of sitting up EOB, however, patient unable to maintain arousal sitting EOB with min A and significantly flexed posture. Performed standing with max A x1 attempting to cue patient to pull up her pants, patient self-initiated sitting. Performed standing with mod A on second trial and +2 assist for pulling pants up with total A, then progressed to min/mod A to step pivot to TIS w/c. Provided cues for initiation, erect posture, and sequencing during transfer. Patient asleep in the TIS w/c upon sitting. PT provided total A to brush patient's hair.   Patient asleep in TIS w/c while PT performed cervical rotation, lateral flexion, and extension PROM, B upper extremity shoulder flexion and abduction and elbow flexion extension through full range with PROM, and DF stretch 2x1 min with knee bent and straight. PROM performed for improved ROM, muscle length, and seated positioning due to increased muscle shortening with reduced activity levels.   Patient returned to bed with max A +2 due to decreased arousal and repositioned in the bed with total A.  Patient in bed at end of session with breaks locked, bed and Posey belt alarm set, and all needs within reach.   Therapy Documentation Precautions:  Precautions Precautions: Fall Precaution Comments: bilateral mitts and bedrails padded Restrictions Weight Bearing Restrictions: No   Therapy/Group: Individual Therapy  [name] L Merica Prell PT, DPT  10/02/2020, 8:40 PM

## 2020-10-02 NOTE — Progress Notes (Signed)
Speech Language Pathology Daily Session Note  Patient Details  Name: Cindy [name] MRN: [id number] Date of Birth: July 20, 1960  Today's Date: 10/02/2020 SLP Individual Time: 1515-1600 SLP Individual Time Calculation (min): 45 min  Short Term Goals: Week 5: SLP Short Term Goal 1 (Week 5): Patient will focus attention to verbal or tactile stimuli in 50% of opportunities with Mod A multimodal cues. SLP Short Term Goal 2 (Week 5): Patient will respond to basic yes/no questions in 50% of opportunities with Mod A multimodal cues. SLP Short Term Goal 3 (Week 5): Patient will follow 1-step commands in 50% of opportunities with Mod A multimodal cues. SLP Short Term Goal 4 (Week 5): Patient will be ~75% intelligibile at the word level with Mod A multimodal cues. SLP Short Term Goal 5 (Week 5): Patient will consume trials pf Dys. 2 textures with minimal overt s/s of aspiration and Mod A multimodal cues for use of swallowing compensatory strategies.  Skilled Therapeutic Interventions: Pt seen for skilled ST with focus on cognitive communication goals. Pt lethargic and mildly agitated this date, requiring consistent verbal and tactile cues to maintain alertness for effective therapeutic activities. Pt denying PO trials at this time. Pt responding to simple yes/no questions related with wants and needs fluctuating between Mod and Max A verbal cues depending on sustained attention to task. Pt intelligibility this date averaged ~25% at word level. Pt noted to be internally distracted, moving around in bed quite a bit. Pt able to follow 1-step commands with 75% accuracy provided mod A verbal cues to reposition in bed and promote comfort. Pt left in bed with alarm set and all needs met. Cont ST POC.   Pain Pain Assessment Pain Scale: 0-10 Pain Score: 0-No pain  Therapy/Group: Individual Therapy  Dewaine Conger 10/02/2020, 3:38 PM

## 2020-10-02 NOTE — Progress Notes (Signed)
Nutrition Follow-up  DOCUMENTATION CODES:   Non-severe (moderate) malnutrition in context of chronic illness  INTERVENTION:  Continue Ensure Enlive po TID, each supplement provides 350 kcal and 20 grams of protein.  Encourage adequate PO intake.   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 Cranial CT/MRI scan was performed and was negative for acute changes but did show remote lacunar infarct. EEG suggestive of cortical dysfunction arising from left hemisphere likely 2/2 underlying structural abnormality post ictal state no seizure or definite epileptiform discharges. Admitted to CIR 4/15.  Pt asleep during time of visit and did not awaken to RD visit. Pt continues on a dysphagia 1 diet with thin liquids.  Meal completion has been 45-100%. Pt currently has Ensure ordered with varied consumption. RD to continue with current nutritional supplementation to aid in caloric and protein needs. Labs and medications reviewed.   Diet Order:   Diet Order            DIET - DYS 1 Room service appropriate? Yes; Fluid consistency: Thin  Diet effective now                 EDUCATION NEEDS:   Not appropriate for education at this time  Skin:  Skin Assessment: Reviewed RN Assessment  Last BM:  5/15  Height:   Ht Readings from  Last 1 Encounters:  09/03/20 5\' 7"  (1.702 m)    Weight:   Wt Readings from Last 1 Encounters:  10/02/20 55.7 kg   BMI:  Body mass index is 19.23 kg/m.  Estimated Nutritional Needs:   Kcal:  1800-2000  Protein:  90-100 grams  Fluid:  >1.8L  10/04/20, MS, RD, LDN RD pager number/after hours weekend pager number on Am[employer].

## 2020-10-03 NOTE — Patient Care Conference (Signed)
Inpatient RehabilitationTeam Conference and Plan of Care Update Date: 10/02/2020   Time: 10:03 AM    Patient Name: Cindy [name]      Medical Record Number: [id number]  Date of Birth: 07-04-1960 Sex: Female         Room/Bed: 4W08C/4W08C-01 Payor Info: Payor: MEDICAID [place] / Plan: MEDICAID [place] / Product Type: *No Product type* /    Admit Date/Time:  08/31/2020  5:12 PM  Primary Diagnosis:  Acute metabolic encephalopathy  Hospital Problems: Principal Problem:   Acute metabolic encephalopathy    Expected Discharge Date: Expected Discharge Date:  (SNF)  Team Members Present: Physician leading conference: Dr. Faith Rogue Care Coodinator Present: Kennyth Arnold, RN, BSN, CRRN;[name], LCSWA Nurse Present: Kennyth Arnold, RN PT Present: Serina Cowper, PT OT Present: Kearney Hard, OT SLP Present: Other (comment) Gerda Diss, SLP) PPS Coordinator present : Fae Pippin, SLP     Current Status/Progress Goal Weekly Team Focus  Bowel/Bladder   patient  is incontinent.  restore continence  time toileting Q2   Swallow/Nutrition/ Hydration   Dys. 1 textures with thin liquids, Mod A  Mod A  tolerance of current diet, use of swallow strategies   ADL's   Improved initiation, still Max A for BADLs  POC adjusted to d/c ADLs and reflect caregiver total A at SNF level  pt/family eductaion, dc planning, OOB tolerance, transfers   Mobility   Improved engagement and consistency this week. Mod-min A bed mobility, mod-max A transfers, mod +2 gait with variable AD.  mod-max A overall  Activity tolerance, functional mobility, caregiver and pt education, safety awareness, motor planning, sitting tolerance   Communication   Mod A  Mod A  increase use of speech intelligibility strategies   Safety/Cognition/ Behavioral Observations  Max A  Max A  initiation, attention, orientation, following commands   Pain   free of pain  remain free of pain  assess pain Q shift   Skin    skin is intact  skin remain intact and free from infection  assess skin Q shift and PRN     Discharge Planning:  SNF pending approval from Corporate office due to insurance.   Team Discussion: No medical updates other than she is eating some better. Incontinent B/B.  Patient on target to meet rehab goals: no, max assist for all ADL's. Intermit supervision, alert enough to sit at the nurses station. Mod assist transfers, walking more.  *See Care Plan and progress notes for long and short-term goals.   Revisions to Treatment Plan:  Not at this time.  Teaching Needs: Family education, medication management, transfer training, gait training, safety awareness.  Current Barriers to Discharge: Decreased caregiver support, Medical stability, Home enviroment access/layout, Incontinence, Lack of/limited family support, Insurance for SNF coverage, Medication compliance, Behavior and Nutritional means  Possible Resolutions to Barriers: Continue current medications, provide emotional support.     Medical Summary Current Status: po better. mood less agitated. remains confused  Barriers to Discharge: Medical stability   Possible Resolutions to Barriers/Weekly Focus: assessment of pt data, labs   Continued Need for Acute Rehabilitation Level of Care: The patient requires daily medical management by a physician with specialized training in physical medicine and rehabilitation for the following reasons: Direction of a multidisciplinary physical rehabilitation program to maximize functional independence : Yes Medical management of patient stability for increased activity during participation in an intensive rehabilitation regime.: Yes Analysis of laboratory values and/or radiology reports with any subsequent need for medication adjustment  and/or medical intervention. : Yes   I attest that I was present, lead the team conference, and concur with the assessment and plan of the team.   Tennis Must 10/03/2020, 5:37 PM

## 2020-10-03 NOTE — Progress Notes (Signed)
SLP Cancellation Note  Patient Details Name: Cindy [name] MRN: [id number] DOB: 12/17/1960   Cancelled treatment:        Unable to arouse pt for SLP treatment. Attempted x3 with washcloth to wipe face and offering pureed snack. Pt remained asleep.                                                                                                [name] [name] 10/03/2020, 11:39 AM

## 2020-10-03 NOTE — Progress Notes (Signed)
Physical Therapy Weekly Progress Note  Patient Details  Name: Cindy [name] MRN: [id number] Date of Birth: 03-29-1961  Beginning of progress report period: Sep 26, 2020 End of progress report period: Oct 03, 2020  Today's Date: 10/03/2020 PT Individual Time: 0800-0845 PT Individual Time Calculation (min): 45 min   Patient continues to have fluctuating progress due to intermittent lethargy limiting participation and cognitive deficits impairing patient's ability to attend and motor plan with functional mobility. Patient continues to require total-mod A +1-2 for bed mobility and transfers and +2 assist for ambulation intermittently. Patient continues to be engaged ~75% of therapy sessions this week and has increased sitting OOB 1-2 hours 3/7 days this week.  Patient continues to demonstrate the following deficits muscle weakness and muscle joint tightness, decreased cardiorespiratoy endurance, impaired timing and sequencing, abnormal tone, ataxia, decreased coordination and decreased motor planning, decreased midline orientation and decreased attention to right, decreased initiation, decreased attention, decreased awareness, decreased problem solving, decreased safety awareness, decreased memory and delayed processing and decreased sitting balance, decreased standing balance, decreased postural control, hemiplegia, decreased balance strategies and difficulty maintaining precautions and therefore will continue to benefit from skilled PT intervention to increase functional independence with mobility.  Patient progressing toward long term goals..  Continue plan of care. Awaiting SNF placement.  PT Short Term Goals Week 4:  PT Short Term Goal 1 (Week 4): STG=LTG, patient is pending SNF placemet. PT Short Term Goal 1 - Progress (Week 4): Progressing toward goal Week 5:  PT Short Term Goal 1 (Week 5): STG=LTG, patient is pending SNF placemet.  Skilled Therapeutic Interventions/Progress Updates:      Patient in bed asleep upon PT arrival. Opened blinds and turned on lights to promote patient arousal, patient slowly aroused to verbal and tactile stimulation and participated throughout PT session. Patient did not display any pain indicators or report pain throughout session. Patient demonstrated increased self-monitoring and awareness indicating fear of falling during ambulation and asking to void during session. Patient with intelligible speech ~75% throughout session.   Therapeutic Activity: Bed Mobility: Donned pants with total A for energy conservation and time management bed level at beginning of session. Patient performed supine to/from sit with mod-min A for trunk and lower extremity management. Provided verbal cues for initiation and sequencing. Asked patient to initiate mobility before providing facilitation with increased time for initiation.  Transfers: Patient performed stand pivot bed<>TIS, TIS<>BSC over the toilet with intermittent use of grab bar, and mat table>TIS with mod A, max A during peri-care following toileting due to prolonged standing and poor trunk stability with mild forward lean. Provided verbal cues and facilitation at mid back and R ischial tuberosity for forward weight shift and erect posture in standing, required facilitation to initiate turn, then able to step to complete turn with cues with increased time. Patient was unsuccessful with voiding with smear BM during toileting, required total A for peri-care and lower body dressing.  Patient provided a comb at the sink to comb her hair before leaving the room, patient handed the comb back to the therapist and confirmed she wanted to therapist to comb her hair. Combed her hair and donned her glasses with total A for time management.   Gait Training:  Patient ambulated 24 feet using Swedish walker with min-mod A and +2 for w/c follow. Ambulated with narrow BOS, increased PF and hip flexion in stance, shuffling gait, forward  trunk lean, improved with elevated height of Swedish walker. Provided verbal cues for sequencing,  initiation, erect posture, and external cues for looking ahead.  Wheelchair Mobility:  Patient was transported in the TIS w/c with total A throughout session for energy conservation and time management.  Neuromuscular Re-ed: Patient performed the following activities: -Patient ambulated 24 feet using Swedish walker with min-mod A and +2 for w/c follow. Ambulated with narrow BOS, increased PF and hip flexion in stance, shuffling gait, forward trunk lean, improved with elevated height of Swedish walker. Provided verbal cues for sequencing, initiation, erect posture, and external cues for looking ahead. Focused on reciprocal gait pattern for initiation of automatic stepping. -sit to/from stand with [place] walker x4, attempted to initiate stepping x3 with patient resistive due to fear of falling stating "I'm gonna fall!" repeatedly  Patient in bed at end of session with breaks locked, bed and posey belt alarm set, and all needs within reach.    Therapy Documentation Precautions:  Precautions Precautions: Fall Precaution Comments: bilateral mitts and bedrails padded Restrictions Weight Bearing Restrictions: No  Therapy/Group: Individual Therapy  Lessa Huge L Jaliya Siegmann PT, DPT  10/03/2020, 11:54 AM

## 2020-10-03 NOTE — Progress Notes (Signed)
Occupational Therapy Session Note  Patient Details  Name: Cindy [name] MRN: [id number] Date of Birth: February 24, 1961  Today's Date: 10/03/2020 OT Individual Time: 1005-1015 OT Individual Time Calculation (min): 10 min    Short Term Goals: Week 1:  OT Short Term Goal 1 (Week 1): Pt will demonstrate improved attention to grooming task for >1 min with mod A. OT Short Term Goal 1 - Progress (Week 1): Not met OT Short Term Goal 2 (Week 1): Pt will tolerate sitting EOB with mod A for >2 min. OT Short Term Goal 2 - Progress (Week 1): Met OT Short Term Goal 3 (Week 1): Pt will don shirt max A. OT Short Term Goal 3 - Progress (Week 1): Not met OT Short Term Goal 4 (Week 1): Pt will self-feed 25% of meal with mod A. OT Short Term Goal 4 - Progress (Week 1): Not met  Skilled Therapeutic Interventions/Progress Updates:    1:1. Pt received asleep in bed. Pt unarousable in bed and requires total A to sit up EOB. Pt able intermittently hold sitting balance for brief periods of time but pt needs overall total A with decreased arousal. Attempted to engage pt in grooming at EOB, however pt conitnues to fall asleep therefore terminated session till pt more aroused and appropriate for tx. Exited session with pt seated in bed, exit alarm on and call light in reach    Therapy Documentation Precautions:  Precautions Precautions: Fall Precaution Comments: bilateral mitts and bedrails padded Restrictions Weight Bearing Restrictions: No General:   Vital Signs: Therapy Vitals Temp: 97.6 F (36.4 C) Pulse Rate: 77 Resp: 19 BP: 128/79 Patient Position (if appropriate): Lying Oxygen Therapy SpO2: (!) 74 % O2 Device: Room Air Pain:   ADL: ADL Eating: Unable to assess Grooming: Unable to assess Upper Body Bathing: Unable to assess Lower Body Bathing: Unable to assess Upper Body Dressing: Unable to assess Lower Body Dressing: Dependent Where Assessed-Lower Body Dressing: Bed level Toileting: Unable  to assess Toilet Transfer: Unable to assess Tub/Shower Transfer: Unable to assess Gaffer Transfer: Unable to assess Vision   Perception    Praxis   Exercises:   Other Treatments:     Therapy/Group: Individual Therapy  Tonny Branch 10/03/2020, 6:53 AM

## 2020-10-04 MED ORDER — FLEET ENEMA 7-19 GM/118ML RE ENEM: 1.0000 | ENEMA | Freq: Every day | RECTAL | Status: DC | PRN

## 2020-10-04 MED ORDER — POTASSIUM CHLORIDE 20 MEQ PO PACK
20.0000 meq | PACK | Freq: Two times a day (BID) | ORAL | Status: DC
  Administered 2020-10-05 – 2020-10-27 (×46): 20 meq via ORAL
  Filled 2020-10-04 (×46): qty 1

## 2020-10-04 MED ORDER — POLYETHYLENE GLYCOL 3350 17 G PO PACK
17.0000 g | PACK | Freq: Every day | ORAL | Status: DC
  Administered 2020-10-05 – 2020-10-27 (×22): 17 g via ORAL
  Filled 2020-10-04 (×24): qty 1

## 2020-10-04 NOTE — Progress Notes (Signed)
Occupational Therapy Session Note  Patient Details  Name: CARLING LIBERMAN MRN: [id number] Date of Birth: 02-Mar-1961  Today's Date: 10/04/2020 OT Missed Time: 30 Minutes Missed Time Reason: Patient fatigue   Pt received in bed asleep and unarousable depite multimodal cuing, respotiioning and use of cold wash cloth. Pt missed 30  Min skilled OT.   Elenore Paddy Koki Buxton 10/04/2020, 6:49 AM

## 2020-10-04 NOTE — Progress Notes (Addendum)
Physical Therapy Session Note  Patient Details  Name: Cindy [name] MRN: [id number] Date of Birth: 10-31-1960  Today's Date: 10/04/2020 PT Individual Time: 0800-0830 PT Individual Time Calculation (min): 30 min   Short Term Goals: Week 5:  PT Short Term Goal 1 (Week 5): STG=LTG, patient is pending SNF placemet.  Skilled Therapeutic Interventions/Progress Updates:     Patient in bed asleep upon PT arrival. Patient slowly aroused to verbal and tactile stimulation. Patient cried out in pain while having a BM during session with firm stool noted, RN made aware. PT provided repositioning, rest breaks, and distraction as pain interventions throughout session.   Therapeutic Activity: Bed Mobility: Patient performed supine to/from sit with mod-min A for trunk and lower extremity management in a flat bed without use of bed rails. Provided verbal cues for initiation and sequencing. Patient placed both legs into the bed this session. Threaded lower extremities into pants following cues for lifting each foot sitting EOB with supervision for sitting balance. Patient stated that she needed to go to the bathroom while sitting EOB. Transfers: Patient performed sit to stand x1 to pull up her pants up with max A due to B upper extremity support needed for standing balance. She performed stand pivot bed<>TIS w/c and TIS w/c<>BSC over toilet. Provided verbal cues for initiating and facilitation for forward weight shift and hip extension. Patient was continent of bowl during toileting. Provided cues for diaphragmatic breathing and distraction/redirection to break up patient straining. Performed peri-care and lower body clothing management with total A from NT. Patient with 1 bout of emesis during BM, suspect from straining. Washed patient's face and patient changed her shirt with min A with cues for initiation and pants with total A.  Patient in bed at end of session with breaks locked, bed alarm set and Posey belt  in place, and all needs within reach.   Therapy Documentation Precautions:  Precautions Precautions: Fall Precaution Comments: bilateral mitts and bedrails padded Restrictions Weight Bearing Restrictions: No   Therapy/Group: Individual Therapy  Kayceon Oki L Khushi Zupko PT, DPT  10/04/2020, 5:34 PM

## 2020-10-04 NOTE — Progress Notes (Signed)
Pt required disimpaction for moderate amount of hard lumps of stool. Pt tolerated well. PRN dose of sorbitol given. Jesusita Oka, PA made aware.   Marylu Lund, RN

## 2020-10-05 NOTE — Progress Notes (Signed)
Occupational Therapy Session Note  Patient Details  Name: Cindy [name] MRN: [id number] Date of Birth: 1960/06/13  Today's Date: 10/05/2020 OT Individual Time: 1021-1030 OT Individual Time Calculation (min): 9 min    Short Term Goals: Week 1:  OT Short Term Goal 1 (Week 1): Pt will demonstrate improved attention to grooming task for >1 min with mod A. OT Short Term Goal 1 - Progress (Week 1): Not met OT Short Term Goal 2 (Week 1): Pt will tolerate sitting EOB with mod A for >2 min. OT Short Term Goal 2 - Progress (Week 1): Met OT Short Term Goal 3 (Week 1): Pt will don shirt max A. OT Short Term Goal 3 - Progress (Week 1): Not met OT Short Term Goal 4 (Week 1): Pt will self-feed 25% of meal with mod A. OT Short Term Goal 4 - Progress (Week 1): Not met  Skilled Therapeutic Interventions/Progress Updates:    1:1. Pt received in bed agreeable to OT after increased time for arousal. Pt initially very impulsive attempting to get up but posey belt prevented pt from getting EOB. Pt requires total A for LB dressing and increased time provided for following cues for OT to don pants. STS at EOB with MOD A and VC for planting R foot onto floor. Pt transfers with MAX A via stand pivot to TIS. SLP arrives for session and direct handoff  Therapy Documentation Precautions:  Precautions Precautions: Fall Precaution Comments: bilateral mitts and bedrails padded Restrictions Weight Bearing Restrictions: No General:   Vital Signs:  Pain:   ADL: ADL Eating: Unable to assess Grooming: Unable to assess Upper Body Bathing: Unable to assess Lower Body Bathing: Unable to assess Upper Body Dressing: Unable to assess Lower Body Dressing: Dependent Where Assessed-Lower Body Dressing: Bed level Toileting: Unable to assess Toilet Transfer: Unable to assess Tub/Shower Transfer: Unable to assess Gaffer Transfer: Unable to assess Vision   Perception    Praxis   Exercises:   Other  Treatments:     Therapy/Group: Individual Therapy  Tonny Branch 10/05/2020, 10:36 AM

## 2020-10-05 NOTE — Progress Notes (Addendum)
Patient ID: Cindy [name], female   DOB: 1960/10/19, 60 y.o.   MRN: [id number]  SW received message from pt husband [name] [phone]) stating forms were left for pt.   *SW found forms in her room.  SW returned phone call to pt husband to inform on CAP/DA forms picked up from pt room, and to inform on signature required for some documents. Unable to reach pt as vm is not set up. SW will continue to make efforts.   SW [phone][facility] to follow-up about updates from business office. Reports she has not received any updates, and was not in the office yesterday. She will follow-up once there is more information.  *SW spoke with pt husband [name] about signatures on CAP/DA application that he will need to sign. SW informed will leave forms in room and will pick up on Monday. Reports he will come by tomorrow to sign forms.   [name], MSW, LCSWA Office: [phone] Cell: [phone] Fax: [phone]

## 2020-10-05 NOTE — Progress Notes (Signed)
Speech Language Pathology Weekly Progress and Session Note  Patient Details  Name: Cindy [name] MRN: [id number] Date of Birth: Feb 12, 1961  Beginning of progress report period: Sep 28, 2020 End of progress report period: Oct 05, 2020  Today's Date: 10/06/2020 SLP Individual Time: 1030-1100 SLP Individual Time Calculation (min): 30 min  Short Term Goals: Week 5: SLP Short Term Goal 1 (Week 5): Patient will focus attention to verbal or tactile stimuli in 50% of opportunities with Mod A multimodal cues. SLP Short Term Goal 1 - Progress (Week 5): Met SLP Short Term Goal 2 (Week 5): Patient will respond to basic yes/no questions in 50% of opportunities with Mod A multimodal cues. SLP Short Term Goal 2 - Progress (Week 5): Met SLP Short Term Goal 3 (Week 5): Patient will follow 1-step commands in 50% of opportunities with Mod A multimodal cues. SLP Short Term Goal 3 - Progress (Week 5): Met SLP Short Term Goal 4 (Week 5): Patient will be ~75% intelligibile at the word level with Mod A multimodal cues. SLP Short Term Goal 4 - Progress (Week 5): Not met SLP Short Term Goal 5 (Week 5): Patient will consume trials pf Dys. 2 textures with minimal overt s/s of aspiration and Mod A multimodal cues for use of swallowing compensatory strategies. SLP Short Term Goal 5 - Progress (Week 5): Not met    New Short Term Goals: Week 6: SLP Short Term Goal 1 (Week 6): STGs=LTGs due to ELOS  Weekly Progress Updates: Patient has made functional gains and has met 3 of 5 STGs this reporting period. Currently, patient demonstrates increased arousal and focused to sustained attention to tasks resulting in increased participation in treatment sessions and functional tasks. Patient demonstrates increased ability to answer yes/no questions but speech intelligibility remains severely impaired at both the word and phrase level. Patient also demonstrates improved ability to follow basic, functional commands. Patient remains  on Dys. 1 textures with thin liquids with increased PO intake. Patient and family education ongoing. Patient would benefit from continued skilled SLP intervention to maximize her cognitive, swallowing and communication prior to discharge.      Intensity: Minumum of 1-2 x/day, 30 to 90 minutes Frequency: 3 to 5 out of 7 days Duration/Length of Stay: waiting SNF placement Treatment/Interventions: Cognitive remediation/compensation;Speech/Language facilitation;Therapeutic Activities;Therapeutic Exercise;Dysphagia/aspiration precaution training;Patient/family education;Environmental controls;Cueing hierarchy;Functional tasks   Daily Session  Skilled Therapeutic Interventions:  Skilled treatment session focused on cognitive-communication goals. SLP facilitated session by providing Max A verbal cues for focused attention to communication task. Patient was unable to name functional objects despite Max A multimodal cues and required Max verbal cues for speech intelligibility at the word level. SLP attempted to place patient's dentures to maximize intelligibility with minimal success due to poor fit. Patient left upright in wheelchair with alarm on and all needs within reach. Continue with current plan of care.       Pain No/Denies Pain   Therapy/Group: Individual Therapy  Bonnie Roig 10/06/2020, 6:36 AM

## 2020-10-05 NOTE — Progress Notes (Signed)
PROGRESS NOTE   Subjective/Complaints: No new problems. Pt sitting comfortably in bed.   ROS: Limited due to cognitive/behavioral   Objective:   No results found. No results for input(s): WBC, HGB, HCT, PLT in the last 72 hours. No results for input(s): NA, K, CL, CO2, GLUCOSE, BUN, CREATININE, CALCIUM in the last 72 hours.  Intake/Output Summary (Last 24 hours) at 10/05/2020 1053 Last data filed at 10/05/2020 0853 Gross per 24 hour  Intake 680 ml  Output --  Net 680 ml        Physical Exam: Vital Signs Blood pressure (!) 154/85, pulse 79, temperature 98.3 F (36.8 C), resp. rate 18, height 5\' 7"  (1.702 m), weight 56 kg, SpO2 98 %.   Constitutional: No distress . Vital signs reviewed. HEENT: EOMI, oral membranes moist Neck: supple Cardiovascular: RRR without murmur. No JVD    Respiratory/Chest: CTA Bilaterally without wheezes or rales. Normal effort    GI/Abdomen: BS +, non-tender, non-distended Ext: no clubbing, cyanosis, or edema Psych: pleasantly confused. Neurologic: Oriented to self, follows simple commands with repetition and cueing. moves all 4's, confused.     Assessment/Plan: 1. Functional deficits which require 3+ hours per day of interdisciplinary therapy in a comprehensive inpatient rehab setting.  Physiatrist is providing close team supervision and 24 hour management of active medical problems listed below.  Physiatrist and rehab team continue to assess barriers to discharge/monitor patient progress toward functional and medical goals  Care Tool:  Bathing  Bathing activity did not occur: Safety/medical concerns Body parts bathed by patient: Face   Body parts bathed by helper: Right arm,Left arm,Chest,Abdomen,Front perineal area,Buttocks,Right upper leg,Left upper leg,Left lower leg,Right lower leg,Face Body parts n/a: Left lower leg,Left upper leg,Right upper leg,Right lower  leg,Buttocks,Abdomen,Front perineal area,Left arm,Right arm,Chest   Bathing assist Assist Level: Total Assistance - Patient < 25%     Upper Body Dressing/Undressing Upper body dressing Upper body dressing/undressing activity did not occur (including orthotics): Safety/medical concerns What is the patient wearing?: Pull over shirt    Upper body assist Assist Level: Total Assistance - Patient < 25%    Lower Body Dressing/Undressing Lower body dressing      What is the patient wearing?: Incontinence brief,Pants     Lower body assist Assist for lower body dressing: Total Assistance - Patient < 25%     Toileting Toileting Toileting Activity did not occur (Clothing management and hygiene only): N/A (no void or bm)  Toileting assist Assist for toileting: Total Assistance - Patient < 25%     Transfers Chair/bed transfer  Transfers assist  Chair/bed transfer activity did not occur: Safety/medical concerns  Chair/bed transfer assist level: Maximal Assistance - Patient 25 - 49%     Locomotion Ambulation   Ambulation assist   Ambulation activity did not occur: Safety/medical concerns  Assist level: 2 helpers Assistive device: Walker-rolling Max distance: 22 ft   Walk 10 feet activity   Assist  Walk 10 feet activity did not occur: Safety/medical concerns  Assist level: 2 helpers Assistive device: Walker-rolling   Walk 50 feet activity   Assist Walk 50 feet with 2 turns activity did not occur: Safety/medical concerns  Assist level:  2 helpers Assistive device: [employer] 150 feet activity   Assist Walk 150 feet activity did not occur: Safety/medical concerns         Walk 10 feet on uneven surface  activity   Assist Walk 10 feet on uneven surfaces activity did not occur: Safety/medical concerns         Wheelchair     Assist Will patient use wheelchair at discharge?: Yes (pt may benefit from [facility] assessment however unsafe to attempt this  AM; per PT long term goals)   Wheelchair activity did not occur: Safety/medical concerns         Wheelchair 50 feet with 2 turns activity    Assist    Wheelchair 50 feet with 2 turns activity did not occur: Safety/medical concerns       Wheelchair 150 feet activity     Assist  Wheelchair 150 feet activity did not occur: Safety/medical concerns       Blood pressure (!) 154/85, pulse 79, temperature 98.3 F (36.8 C), resp. rate 18, height 5\' 7"  (1.702 m), weight 56 kg, SpO2 98 %.  Medical Problem List and Plan: 1.  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             -patient may shower             - SNF placement pending.           -Continue CIR therapies including PT, OT, and SLP   - Advanced WM changes on MRI 4/12 likely related to SVD. Pt + for cocaine on admit.      2.  Impaired mobility -DVT/anticoagulation: Continue Lovenox             -antiplatelet therapy: Aspirin 325 mg 3. Pain Management: Continue Tylenol as needed 4. Agitation/psychosis: Antipsychotic: Continue zyprexa 5mg  hs and 2.5mg  daily  - Continue bilateral mittens as needed  -depakote 750mg  tid   -level was 94 on Monday   -reduced to 500mg  bid  -melatonin 3mg  HS  -5/20 agitation better    -limit prn haldol   5. Neuropsych: This patient is capable of making decisions on her own behalf. 6. Skin/Wound Care: Routine skin checks 7. Fluids/Electrolytes/Nutrition:     Filed Weights   10/03/20 0500 10/04/20 0500 10/05/20 0449  Weight: 56 kg 56.2 kg 56 kg    Weights stable to trending up      8.  Seizure disorder.  Vimpat 200 mg twice daily, valproic acid 1000 mg twice daily.  EEG negative 9.  Hypertension.  Norvasc 10 mg daily     5/20 bp under fair  control.  continue lopressor 25mg  bid 10.  History of bilateral cerebral convexity infarction frontal region.  CIR 08/19/2019-08/27/2019 11.   Hyperlipidemia.  Continue Lipitor 12.  History of polysubstance abuse.  Urine drug screen positive cocaine.   13. AKI: any increases in Cr seem to be prerenal   14. UTI: UCX never performed.     -f/u urine cx negative 15. Dysphagia:     -D1, thins   -intake remains inconsistent, but generally better  LOS: 35 days A FACE TO FACE EVALUATION WAS PERFORMED  10/05/20 10/05/2020, 10:53 AM

## 2020-10-05 NOTE — Progress Notes (Addendum)
Physical Therapy Session Note  Patient Details  Name: Cindy [name] MRN: [id number] Date of Birth: 09-25-1960  Today's Date: 10/05/2020 PT Individual Time: 1345-1410 PT Individual Time Calculation (min): 25 min   Short Term Goals: Week 5:  PT Short Term Goal 1 (Week 5): STG=LTG, patient is pending SNF placem[place].  Skilled Therapeutic Interventions/Progress Updates:     Patient in bed with eyes partially open, restless with her hands and TV on upon PT arrival. Patient alert throughout session, no external indicators of pain throughout session. Turned off TV and patient relaxed in the bed. Turned on slow jazz and patient came to sit EOB with min-mod A. Patient using R hand to move up/down to the music. Patient performed sit to/from stand x3 with mod A for boosting and hip extension today. Patient swayed to the music and laughing with therapist in standing x10-30 sec bouts.   Patient stated "bathroom" and performed stand pivot bed>TIS w/c with min A reaching for opposite arm rest without cues for support. Provided simple one-step commands for initiation. Transported patient to the bathroom and performed toilet transfer with mod-max A due to challenges with lower body clothing management without a second person assist. Completed pulling pants and brief down in sitting with lateral leans with patient becoming fearful of falling during lean and crying out. Patient performed partial sit to/form stand x3 with max A due to impulsivity/restlessness vs repositioning, unclear due to cognitive deficits. Patient was continent of bladder. Patient initiated removing her pants and socks becoming frustrated when unable to on her own. PT removed her socks and pants. Placed a towel in the TIS w/c and performed transfer back to TIS w/c and transported patient back to bed. NT arrived and performed peri-care and donned a new brief with patient in standing with max A and mod-max A +2 to turn to sit due to patient with poor  attention/awareness with increased in frustration/agitation while swinging her arms without purpose.   Patient returned to supine with max A. Lowered blinds and turned on medication/relaxation music for reduced stimulation. Patient closing her eyes and lying calm in the bed at end of session. Patient in bed at end of session with breaks locked, bed and Posey belt alarm set, and all needs within reach.    Therapy Documentation Precautions:  Precautions Precautions: Fall Precaution Comments: bilateral mitts and bedrails padded Restrictions Weight Bearing Restrictions: No   Therapy/Group: Individual Therapy  Keontre Defino L Tamara Kenyon PT, DPT  10/05/2020, 2:11 PM

## 2020-10-06 NOTE — Progress Notes (Signed)
Occupational Therapy Session Note  Patient Details  Name: Cindy [name] MRN: [id number] Date of Birth: 29-Jan-1961  Today's Date: 10/06/2020 OT Individual Time: 5366-4403 OT Individual Time Calculation (min): 29 min    Skilled Therapeutic Interventions/Progress Updates:    Pt greeted in bed, appearing agitated, yelling, trying to pull her shirt over her head. Pt found to be very incontinent of urine, brief and bed soaked. Utilized calming cues so pt could be cooperative/participative during toileting tasks bedlevel. Mod hand over hand assist for pt to complete frontal hygiene, Mod A to roll Rt>Lt for brief change and to place clean towels between pt and soiled bed linen. Pt initiated long sitting in bed for her shirt to be removed. Donned clean hospital gown, pt extending each arm out to therapist with presentation of gown sleeve. Mod-Max A for supine<sit and Mod A for stand pivot<chair. NT arrived to change her bed linen. OT provided Min balance assist while pt sat due to impulsive/figity movements. Mod A for stand pivot<bed where she was assisted with returning to supine. +2 for boosting her up in bed, pt giggling after the boost, appearing in much brighter spirits now when compared to start of session. She was left in care of NT to eat breakfast.   Therapy Documentation Precautions:  Precautions Precautions: Fall Precaution Comments: bilateral mitts and bedrails padded Restrictions Weight Bearing Restrictions: No Pain: no s/s pain during tx Pain Assessment Pain Scale: Faces Pain Score: 0-No pain Faces Pain Scale: No hurt ADL: ADL Eating: Unable to assess Grooming: Unable to assess Upper Body Bathing: Unable to assess Lower Body Bathing: Unable to assess Upper Body Dressing: Unable to assess Lower Body Dressing: Dependent Where Assessed-Lower Body Dressing: Bed level Toileting: Unable to assess Toilet Transfer: Unable to assess Tub/Shower Transfer: Unable to assess Psychologist, counselling  Transfer: Unable to assess      Therapy/Group: Individual Therapy  Aquilla Shambley A Mio Schellinger 10/06/2020, 12:26 PM

## 2020-10-06 NOTE — Progress Notes (Signed)
Occupational Therapy Weekly Progress Note  Patient Details  Name: Cindy [name] MRN: [id number] Date of Birth: 1960-10-24  Beginning of progress report period: Sep 28, 2020 End of progress report period: Oct 06, 2020   Patient has met 1 of 4 short term goals.  Pt continues to make minimal progress with tx d/t pt fatigue/severly impaired cognition. Pt requires as little as MOD A for transfers, but up to total A. Pt arousal continues to wax/wane. Pt is awaiting SNF placement for follow up care  Patient continues to demonstrate the following deficits: muscle weakness, decreased cardiorespiratoy endurance, impaired timing and sequencing, abnormal tone, unbalanced muscle activation, motor apraxia, ataxia and decreased coordination, decreased visual perceptual skills and decreased visual motor skills, decreased motor planning and ideational apraxia, decreased initiation, decreased attention, decreased awareness, decreased problem solving, decreased safety awareness, decreased memory and delayed processing and decreased sitting balance, decreased standing balance, decreased postural control, hemiplegia and decreased balance strategies and therefore will continue to benefit from skilled OT intervention to enhance overall performance with BADL and Reduce care partner burden.  Patient progressing toward long term goals..  Continue plan of care.  OT Short Term Goals Week 5:  OT Short Term Goal 1 (Week 5): Pt will tolerate sitting OOB in TIS at RN station 1x per day to improve OOB tolerance OT Short Term Goal 1 - Progress (Week 5): Progressing toward goal OT Short Term Goal 2 (Week 5): Pt will complete toilet transfer with MOD A OT Short Term Goal 2 - Progress (Week 5): Progressing toward goal OT Short Term Goal 3 (Week 5): Pt will don shirt wiht MAX A OT Short Term Goal 3 - Progress (Week 5): Met OT Short Term Goal 4 (Week 5): Pt will bathe UB with MOD A for thoroughness OT Short Term Goal 4 - Progress  (Week 5): Progressing toward goal Week 6:  OT Short Term Goal 1 (Week 6): STG = LTG d/t ELOS  Lowella Dell Eveny Anastas 10/06/2020, 4:43 PM

## 2020-10-07 DIAGNOSIS — R1310 Dysphagia, unspecified: Secondary | ICD-10-CM

## 2020-10-07 DIAGNOSIS — N179 Acute kidney failure, unspecified: Secondary | ICD-10-CM

## 2020-10-07 DIAGNOSIS — R451 Restlessness and agitation: Secondary | ICD-10-CM

## 2020-10-07 DIAGNOSIS — R569 Unspecified convulsions: Secondary | ICD-10-CM

## 2020-10-07 NOTE — Progress Notes (Signed)
PROGRESS NOTE   Subjective/Complaints: Patient seen laying in bed this morning.  No reported issues overnight.  ROS: Limited due to cognition.  Objective:   No results found. No results for input(s): WBC, HGB, HCT, PLT in the last 72 hours. No results for input(s): NA, K, CL, CO2, GLUCOSE, BUN, CREATININE, CALCIUM in the last 72 hours.  Intake/Output Summary (Last 24 hours) at 10/07/2020 0845 Last data filed at 10/06/2020 1830 Gross per 24 hour  Intake 500 ml  Output --  Net 500 ml        Physical Exam: Vital Signs Blood pressure 129/89, pulse 74, temperature 97.7 F (36.5 C), temperature source Oral, resp. rate 18, height 5\' 7"  (1.702 m), weight 56.7 kg, SpO2 100 %.  Constitutional: No distress . Vital signs reviewed. HENT: Normocephalic.  Atraumatic. Eyes: EOMI. No discharge. Cardiovascular: No JVD.  RRR. Respiratory: Normal effort.  No stridor.  Bilateral clear to auscultation. GI: Non-distended.  BS +. Skin: Warm and dry.  Intact. Psych: Confused.  Repetitive. Musc: No edema in extremities.  No tenderness in extremities. Neuro: Alert Motor:>/ 3/5 throughout   Assessment/Plan: 1. Functional deficits which require 3+ hours per day of interdisciplinary therapy in a comprehensive inpatient rehab setting.  Physiatrist is providing close team supervision and 24 hour management of active medical problems listed below.  Physiatrist and rehab team continue to assess barriers to discharge/monitor patient progress toward functional and medical goals  Care Tool:  Bathing  Bathing activity did not occur: Safety/medical concerns Body parts bathed by patient: Face   Body parts bathed by helper: Right arm,Left arm,Chest,Abdomen,Front perineal area,Buttocks,Right upper leg,Left upper leg,Left lower leg,Right lower leg,Face Body parts n/a: Left lower leg,Left upper leg,Right upper leg,Right lower  leg,Buttocks,Abdomen,Front perineal area,Left arm,Right arm,Chest   Bathing assist Assist Level: Total Assistance - Patient < 25%     Upper Body Dressing/Undressing Upper body dressing Upper body dressing/undressing activity did not occur (including orthotics): Safety/medical concerns What is the patient wearing?: Pull over shirt    Upper body assist Assist Level: Total Assistance - Patient < 25%    Lower Body Dressing/Undressing Lower body dressing      What is the patient wearing?: Incontinence brief,Pants     Lower body assist Assist for lower body dressing: Total Assistance - Patient < 25%     Toileting Toileting Toileting Activity did not occur and hygiene only): N/A (no void or bm)  Toileting assist Assist for toileting: Maximal Assistance - Patient 25 - 49% (bedlevel/brief change)     Transfers Chair/bed transfer  Transfers assist  Chair/bed transfer activity did not occur: Safety/medical concerns  Chair/bed transfer assist level: Maximal Assistance - Patient 25 - 49%     Locomotion Ambulation   Ambulation assist   Ambulation activity did not occur: Safety/medical concerns  Assist level: 2 helpers Assistive device: Walker-rolling Max distance: 22 ft   Walk 10 feet activity   Assist  Walk 10 feet activity did not occur: Safety/medical concerns  Assist level: 2 helpers Assistive device: Walker-rolling   Walk 50 feet activity   Assist Walk 50 feet with 2 turns activity did not occur: Safety/medical concerns  Assist  level: 2 helpers Assistive device: [employer] 150 feet activity   Assist Walk 150 feet activity did not occur: Safety/medical concerns         Walk 10 feet on uneven surface  activity   Assist Walk 10 feet on uneven surfaces activity did not occur: Safety/medical concerns         Wheelchair     Assist Will patient use wheelchair at discharge?: Yes (pt may benefit from [facility] assessment  however unsafe to attempt this AM; per PT long term goals)   Wheelchair activity did not occur: Safety/medical concerns         Wheelchair 50 feet with 2 turns activity    Assist    Wheelchair 50 feet with 2 turns activity did not occur: Safety/medical concerns       Wheelchair 150 feet activity     Assist  Wheelchair 150 feet activity did not occur: Safety/medical concerns       Blood pressure 129/89, pulse 74, temperature 97.7 F (36.5 C), temperature source Oral, resp. rate 18, height 5\' 7"  (1.702 m), weight 56.7 kg, SpO2 100 %.  Medical Problem List and Plan: 1.  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             - SNF placement pending.   - Advanced WM changes on MRI 4/12 likely related to SVD. Pt + for cocaine on admit.    Continue CIR   2.  Impaired mobility -DVT/anticoagulation: Continue Lovenox             -antiplatelet therapy: Aspirin 325 mg 3. Pain Management: Continue Tylenol as needed 4. Agitation/psychosis: Antipsychotic: Continue zyprexa 5mg  hs and 2.5mg  daily  - Continue bilateral mittens as needed  -depakote 750mg  tid, reduced to 500 twice daily  -melatonin 3mg  HS    -limit prn haldol    Appears to be controlled on 5/22, may need to decrease medications 5. Neuropsych: This patient is capable of making decisions on her own behalf. 6. Skin/Wound Care: Routine skin checks 7. Fluids/Electrolytes/Nutrition:     Filed Weights   10/05/20 0449 10/06/20 0418 10/07/20 0500  Weight: 56 kg 55.8 kg 56.7 kg    Stable on 5/22  8.  Seizure disorder.  Vimpat 200 mg twice daily, valproic acid 1000 mg twice daily.  EEG negative  No breakthrough seizures on 5/22 9.  Hypertension.  Norvasc 10 mg daily     Continue lopressor 25mg  bid  Controlled on 5/22 10.  History of bilateral cerebral convexity infarction frontal region.  CIR 08/19/2019-08/27/2019 11.   Hyperlipidemia.  Continue Lipitor 12.  History of polysubstance abuse.  Urine drug screen positive cocaine.   13. AKI:   Creatinine 1.20 on 5/16, labs ordered for tomorrow 14. UTI: UCX never performed.     -f/u urine cx negative 15. Dysphagia:     -D1, thins  Relatively good p.o. intake  LOS: 37 days A FACE TO FACE EVALUATION WAS PERFORMED  Cindy [name] 10/07/2020, 8:45 AM

## 2020-10-08 DIAGNOSIS — R1312 Dysphagia, oropharyngeal phase: Secondary | ICD-10-CM

## 2020-10-08 DIAGNOSIS — R7989 Other specified abnormal findings of blood chemistry: Secondary | ICD-10-CM

## 2020-10-08 LAB — BASIC METABOLIC PANEL
Anion gap: 6 (ref 5–15)
BUN: 32 mg/dL — ABNORMAL HIGH (ref 8–23)
CO2: 28 mmol/L (ref 22–32)
Calcium: 9.2 mg/dL (ref 8.9–10.3)
Chloride: 104 mmol/L (ref 98–111)
Creatinine, Ser: 1.17 mg/dL — ABNORMAL HIGH (ref 0.44–1.00)
GFR, Estimated: 53 mL/min — ABNORMAL LOW (ref >60)
Glucose, Bld: 100 mg/dL — ABNORMAL HIGH (ref 70–99)
Potassium: 3.9 mmol/L (ref 3.5–5.1)
Sodium: 138 mmol/L (ref 135–145)

## 2020-10-08 NOTE — Progress Notes (Signed)
Occupational Therapy Session Note  Patient Details  Name: Cindy [name] MRN: [id number] Date of Birth: Sep 21, 1960  Today's Date: 10/08/2020 OT Individual Time: 0940-1005 OT Individual Time Calculation (min): 25 min    Short Term Goals: Week 6:  OT Short Term Goal 1 (Week 6): STG = LTG d/t ELOS  Skilled Therapeutic Interventions/Progress Updates:    Pt received sitting in TIS at the nurses desk. She had no c/o or indications of pain and was resting comfortably. Mod cueing for initiation of self care tasks at the sink, including hair brushing, hand washing, and face washing. She was able to initiate motor plan, then required mod facilitation to carryover and to maintain. Following termination of tasks pt still attempting to activate motor plan again, especially for hair brushing. Pt stood with max A for dependent brief change (incontinent of urine). Pt demonstrates increased anxiety of falling when TIS is in more neutral position, requiring intermittent manual facilitation to bring trunk to the backrest. Pt then consumed 90% of a magic cup with total A for feeding but only min cueing for swallowing. Pt returned to her bed with max A stand pivot. She was left supine with her husband now present. Bed alarm set and posey waistbelt on.   Therapy Documentation Precautions:  Precautions Precautions: Fall Precaution Comments: bilateral mitts and bedrails padded Restrictions Weight Bearing Restrictions: No  Therapy/Group: Individual Therapy  Crissie Reese 10/08/2020, 6:28 AM

## 2020-10-08 NOTE — Progress Notes (Signed)
PROGRESS NOTE   Subjective/Complaints: No new issues. Up at nurses station.  ROS: Limited due to cognitive/behavioral   Objective:   No results found. No results for input(s): WBC, HGB, HCT, PLT in the last 72 hours. Recent Labs    10/08/20 0644  NA 138  K 3.9  CL 104  CO2 28  GLUCOSE 100*  BUN 32*  CREATININE 1.17*  CALCIUM 9.2    Intake/Output Summary (Last 24 hours) at 10/08/2020 0930 Last data filed at 10/08/2020 0841 Gross per 24 hour  Intake 560 ml  Output 2 ml  Net 558 ml        Physical Exam: Vital Signs Blood pressure 125/77, pulse 68, temperature 98.1 F (36.7 C), temperature source Oral, resp. rate 16, height 5\' 7"  (1.702 m), weight 56.7 kg, SpO2 99 %.  Constitutional: No distress . Vital signs reviewed. HEENT: EOMI, oral membranes moist Neck: supple Cardiovascular: RRR without murmur. No JVD    Respiratory/Chest: CTA Bilaterally without wheezes or rales. Normal effort    GI/Abdomen: BS +, non-tender, non-distended Ext: no clubbing, cyanosis, or edema Psych: pleasant and cooperative Musc: No edema in extremities.  No tenderness in extremities. Neuro: Alert, confused Motor:>/ 3/5 throughout   Assessment/Plan: 1. Functional deficits which require 3+ hours per day of interdisciplinary therapy in a comprehensive inpatient rehab setting.  Physiatrist is providing close team supervision and 24 hour management of active medical problems listed below.  Physiatrist and rehab team continue to assess barriers to discharge/monitor patient progress toward functional and medical goals  Care Tool:  Bathing  Bathing activity did not occur: Safety/medical concerns Body parts bathed by patient: Face   Body parts bathed by helper: Right arm,Left arm,Chest,Abdomen,Front perineal area,Buttocks,Right upper leg,Left upper leg,Left lower leg,Right lower leg,Face Body parts n/a: Left lower leg,Left upper  leg,Right upper leg,Right lower leg,Buttocks,Abdomen,Front perineal area,Left arm,Right arm,Chest   Bathing assist Assist Level: Total Assistance - Patient < 25%     Upper Body Dressing/Undressing Upper body dressing Upper body dressing/undressing activity did not occur (including orthotics): Safety/medical concerns What is the patient wearing?: Pull over shirt    Upper body assist Assist Level: Total Assistance - Patient < 25%    Lower Body Dressing/Undressing Lower body dressing      What is the patient wearing?: Incontinence brief,Pants     Lower body assist Assist for lower body dressing: Total Assistance - Patient < 25%     Toileting Toileting Toileting Activity did not occur and hygiene only): N/A (no void or bm)  Toileting assist Assist for toileting: Maximal Assistance - Patient 25 - 49% (bedlevel/brief change)     Transfers Chair/bed transfer  Transfers assist  Chair/bed transfer activity did not occur: Safety/medical concerns  Chair/bed transfer assist level: Maximal Assistance - Patient 25 - 49%     Locomotion Ambulation   Ambulation assist   Ambulation activity did not occur: Safety/medical concerns  Assist level: 2 helpers Assistive device: Walker-rolling Max distance: 22 ft   Walk 10 feet activity   Assist  Walk 10 feet activity did not occur: Safety/medical concerns  Assist level: 2 helpers Assistive device: Walker-rolling   Walk 50 feet  activity   Assist Walk 50 feet with 2 turns activity did not occur: Safety/medical concerns  Assist level: 2 helpers Assistive device: Walker-Eva    Walk 150 feet activity   Assist Walk 150 feet activity did not occur: Safety/medical concerns         Walk 10 feet on uneven surface  activity   Assist Walk 10 feet on uneven surfaces activity did not occur: Safety/medical concerns         Wheelchair     Assist Will patient use wheelchair at discharge?: Yes (pt  may benefit from [facility] assessment however unsafe to attempt this AM; per PT long term goals)   Wheelchair activity did not occur: Safety/medical concerns         Wheelchair 50 feet with 2 turns activity    Assist    Wheelchair 50 feet with 2 turns activity did not occur: Safety/medical concerns       Wheelchair 150 feet activity     Assist  Wheelchair 150 feet activity did not occur: Safety/medical concerns       Blood pressure 125/77, pulse 68, temperature 98.1 F (36.7 C), temperature source Oral, resp. rate 16, height 5\' 7"  (1.702 m), weight 56.7 kg, SpO2 99 %.  Medical Problem List and Plan: 1.  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             - SNF placement still pending.   - Advanced WM changes on MRI 4/12 likely related to SVD. Pt + for cocaine on admit.    Continue CIR   2.  Impaired mobility -DVT/anticoagulation: Continue Lovenox             -antiplatelet therapy: Aspirin 325 mg 3. Pain Management: Continue Tylenol as needed 4. Agitation/psychosis: Antipsychotic: Continue zyprexa 5mg  hs and 2.5mg  daily  - Continue bilateral mittens as needed  -depakote 500mg  tid  -melatonin 3mg  HS    -limit prn haldol    -consider dcing am zyprexa 5. Neuropsych: This patient is capable of making decisions on her own behalf. 6. Skin/Wound Care: Routine skin checks 7. Fluids/Electrolytes/Nutrition:    I personally reviewed the patient's labs today.    -push PO fluids, BUN sl elevated Filed Weights   10/05/20 0449 10/06/20 0418 10/07/20 0500  Weight: 56 kg 55.8 kg 56.7 kg    Stable on 5/22  8.  Seizure disorder.  Vimpat 200 mg twice daily, valproic acid 1000 mg twice daily.  EEG negative  No breakthrough seizures on 5/22 9.  Hypertension.  Norvasc 10 mg daily     Continue lopressor 25mg  bid  Controlled on 5/23 10.  History of bilateral cerebral convexity  infarction frontal region.  CIR 08/19/2019-08/27/2019 11.  Hyperlipidemia.  Continue Lipitor 12.  History of polysubstance abuse.  Urine drug screen positive cocaine.   13. AKI:   Creatinine 1.17 on 5/23 14. UTI: UCX never performed.     -f/u urine cx negative 15. Dysphagia:     -D1, thins  Relatively good p.o. intake  LOS: 38 days A FACE TO FACE EVALUATION WAS PERFORMED  10/08/2020, 9:30 AM

## 2020-10-08 NOTE — Progress Notes (Addendum)
Patient ID: Cindy [name], female   DOB: [birth year], 60 y.o.   MRN: [id number]  Sherrie Sport efforts to make contact withDebbie Powell[facility] [phone]) tofollow-up about updates from business office, however, voicemail full. SW will continue to follow-up.  SW faxed [facility] CAP/DA forms to [phone] and mailed originals forms to:   [facility] [employer] [facility] [employer]- CAP unit [address] [place], [place] [address]  [name], MSW, [place] Office: [phone] Cell: [phone] Fax: [phone]

## 2020-10-08 NOTE — Progress Notes (Addendum)
Speech Language Pathology Daily Session Note  Patient Details  Name: Cindy [name] MRN: [id number] Date of Birth: [birth year]  Today's Date: 10/08/2020 SLP Individual Time: 1040-1105 SLP Individual Time Calculation (min): 25 min  Short Term Goals: Week 6: SLP Short Term Goal 1 (Week 6): STGs=LTGs due to ELOS  Skilled Therapeutic Interventions: Skilled SLP intervention focused on dysphagia. Pt very lethargic but alert enough for safe po trials She was fed by slp  With dys 1 snack and tolerated well with no overt s/sx of aspiration or penetration. Swallow reflex was timely, Voice was clear after trials. Pt requested cake during session. Attempted dys 2 trials however pt refused. Session ended early due to patinet sliding back down in bed to lay down and sleep. Cont with therapy per plan of care.      Pain Pain Assessment Pain Scale: Faces Pain Score: 0-No pain Faces Pain Scale: No hurt  Therapy/Group: Individual Therapy  Cindy [name] 10/08/2020, 11:00 AM

## 2020-10-08 NOTE — Progress Notes (Signed)
Physical Therapy Session Note  Patient Details  Name: Cindy [name] MRN: [id number] Date of Birth: [birth year]  Today's Date: 10/08/2020 PT Individual Time: 0800-0830 PT Individual Time Calculation (min): 30 min   Short Term Goals: Week 5:  PT Short Term Goal 1 (Week 5): STG=LTG, patient is pending SNF placemet.  Skilled Therapeutic Interventions/Progress Updates:     Patient in bed with NT completing set-up for breakfast upon PT arrival. Patient alert and cooperative throughout session. Patient displayed no indicators of pain throughout session.    Therapeutic Activity: Feeding/R upper extremity NMR: Patient performed backwards chaining feeding with PT focused on cues for increased elbow extension with reach to built-up spoon with foam tubing. Focused on accuracy of locating the spoon, as patient tend to undershoot, and functional grasp with R hand to allow patient to bring the spoon to her mouth without assist. Patient consumed >50% of her tray and 100% of a Ensure blended with magic cup for increased protein consumption.  Bed Mobility: Patient performed supine to long sitting with CGA to doff hospital gown and don a paper scrub top with min A with cues for self-initiation. She then scooted to sitting EOB with min A for initiation and trunk stabilization. Provided verbal cues for sequencing with simple one-step commands. Threaded her legs through paper scrub pants with max A due to decreased attention to task.  Transfers: Patient performed sit to/from stand x2 with mod A to pull patient's pants up with max A. Performed stand pivot to TIS w/c with mod-min A with cues for stepping and forward weight shift. Patient performed scooting back in the TIS w/c with supervision and cues for initiation.  Patient transported to the sink where she used her R hand to comb her hair with built up handle on the comb for improved grip. Unable to locate patient's glasses in the room this morning, RN and NT made  aware. Patient sitting in TIS w/c at the nurses station at end of session with breaks locked, seat belt alarm set, and all needs within reach. Patient questioned therapist leaving and stated, "if you're leaving, I'm going home," and perseverated on this statement with progressive volume due to decreased frustration tolerance. PT oriented patient to place and situation and d/c plan and informed her that she would be sitting at the nurses station for 1 hour until OT arrives for her upcoming session. Patient became calm and PT provided her a newspaper to look for while she sat.    Therapy Documentation Precautions:  Precautions Precautions: Fall Precaution Comments: bilateral mitts and bedrails padded Restrictions Weight Bearing Restrictions: No   Therapy/Group: Individual Therapy  Cindy [name] PT, DPT  10/08/2020, 4:14 PM

## 2020-10-08 NOTE — Plan of Care (Signed)
  Problem: RH Balance Goal: LTG Patient will maintain dynamic sitting balance (PT) Description: LTG:  Patient will maintain dynamic sitting balance with assistance during mobility activities (PT) Flowsheets (Taken 09/01/2020 1221 by Barbaraann Faster, PT) LTG: Pt will maintain dynamic sitting balance during mobility activities with:: Contact Guard/Touching assist Goal: LTG Patient will maintain dynamic standing balance (PT) Description: LTG:  Patient will maintain dynamic standing balance with assistance during mobility activities (PT) Flowsheets (Taken 10/08/2020 1616) LTG: Pt will maintain dynamic standing balance during mobility activities with:: Moderate Assistance - Patient 50 - 74%   Problem: Sit to Stand Goal: LTG:  Patient will perform sit to stand with assistance level (PT) Description: LTG:  Patient will perform sit to stand with assistance level (PT) Flowsheets (Taken 09/09/2020 1423) LTG: PT will perform sit to stand in preparation for functional mobility with assistance level: (Downgraded goal due ot slow progress, limited by lethargy and agitated behaviors/decreased awareness.) Moderate Assistance - Patient 50 - 74%   Problem: RH Bed Mobility Goal: LTG Patient will perform bed mobility with assist (PT) Description: LTG: Patient will perform bed mobility with assistance, with/without cues (PT). Flowsheets (Taken 10/08/2020 1616) LTG: Pt will perform bed mobility with assistance level of: Moderate Assistance - Patient 50 - 74%   Problem: RH Bed to Chair Transfers Goal: LTG Patient will perform bed/chair transfers w/assist (PT) Description: LTG: Patient will perform bed to chair transfers with assistance (PT). Flowsheets (Taken 10/08/2020 1616) LTG: Pt will perform Bed to Chair Transfers with assistance level: (upgraded goal due to patient's progress with arousal and motor planning with functional mobility.) Moderate Assistance - Patient 50 - 74% Note: upgraded goal due to patient's  progress with arousal and motor planning with functional mobility.   Problem: RH Ambulation Goal: LTG Patient will ambulate in controlled environment (PT) Description: LTG: Patient will ambulate in a controlled environment, # of feet with assistance (PT). Flowsheets (Taken 09/09/2020 1423) LTG: Ambulation distance in controlled environment: 50 ft using LRAD

## 2020-10-09 NOTE — Progress Notes (Signed)
Speech Language Pathology Daily Session Note  Patient Details  Name: Cindy [name] MRN: [id number] Date of Birth: 1960/12/18  Today's Date: 10/09/2020 SLP Individual Time: 1430-1445 SLP Individual Time Calculation (min): 15 min and Today's Date: 10/09/2020 SLP Missed Time: 15 Minutes Missed Time Reason: Patient fatigue  Short Term Goals: Week 6: SLP Short Term Goal 1 (Week 6): STGs=LTGs due to ELOS  Skilled Therapeutic Interventions: Skilled treatment session focused on cognitive goals. Upon arrival, patient was sleep and required extra time to rouse. SLP facilitated session by providing Max A verbal cues for use of speech intelligibility strategies at the word level. During spontaneous speech, patient was unintelligible, however, during a structured task, patient was ~25% intelligible, suspect due to SLP having increased context. Patient able to follow commands in 25% of opportunities during structured task and able to identify colors in 30% of opportunities. Patient fell asleep in middle of task and difficult to rouse, therefore, patient missed remaining 15 minutes of session. Patient left upright in bed with alarm on and all needs within reach. Continue with current plan of care.      Pain No/Denies Pain   Therapy/Group: Individual Therapy  Raif Chachere 10/09/2020, 3:29 PM

## 2020-10-09 NOTE — Progress Notes (Signed)
Occupational Therapy Session Note  Patient Details  Name: Cindy [name] MRN: [id number] Date of Birth: 1960/09/09  Today's Date: 10/09/2020 OT Individual Time: 5465-0354 OT Individual Time Calculation (min): 25 min    Short Term Goals: Week 6:  OT Short Term Goal 1 (Week 6): STG = LTG d/t ELOS  Skilled Therapeutic Interventions/Progress Updates:    Pt received on toilet with PT- voiding urine! Pt completed squat pivot transfer to the TIS shower chair with max A. Pt required max A to undress. Once in shower and OT initiated hair washing pt became increasingly agitated and began yelling. Attempted to change environment but was unable to settle pt. Only a few words were intelligible, occasional "stop it" and "no washing". Total A for hair washing. Terminated shower d/t pt agitation. Pt was returned to bed, requiring max A d/t restlessness. Max A to dress. Pt was left supine with all needs met, posey belt on and bed alarm set. Soothing music on to relax pt.   Therapy Documentation Precautions:  Precautions Precautions: Fall Precaution Comments: bilateral mitts and bedrails padded Restrictions Weight Bearing Restrictions: No  Therapy/Group: Individual Therapy  Curtis Sites 10/09/2020, 6:18 AM

## 2020-10-09 NOTE — Progress Notes (Signed)
Physical Therapy Session Note  Patient Details  Name: Cindy [name] MRN: [id number] Date of Birth: 1960-08-05  Today's Date: 10/09/2020 PT Individual Time: 8242-3536 PT Individual Time Calculation (min): 25 min   Short Term Goals: Week 5:  PT Short Term Goal 1 (Week 5): STG=LTG, patient is pending SNF placemet.  Skilled Therapeutic Interventions/Progress Updates:    Pt received supine in bed and appears eager to participate in session initiating mobility towards EOB. Pt suddenly becomes emotional talking about how she walked really well yesterday - therapist provided emotional support and encouragement to continue advancing mobility each day with pt being consoled. Supine>sitting L EOB with mod assist to scoot hips towards EOB. Pt continues to demo a truncal ataxia type movement with constant postural sway/jerking in sitting requiring close guarding for safety - also demos R lean bias during sitting and standing requiring increased assist to maintain in midline. Sitting EOB, donned pants with max assist for threading LEs - pt able to assist with lifting LEs for threading with an ataxic/uncoordinated movement in LEs. Sit>stand, no AD, with heavy mod assist to come to standing and maintain balance while pt pulls pants over hips with min assist - requires 2 standing trials prior to pt being able to pull pants up due to ataxic movements in UEs with noted sensory impairments as pt unable to tell when she has grasped her pants. R stand pivot EOB>w/c with mod assist for balance and pt demoing good motor planning and understanding of sequencing to complete this transfer. Upon questioning pt states need to use bathroom. Transported into bathroom in TIS w/c - pt good with command following to wait until therapist has w/c set-up and in position prior to initiating mobility. R stand pivot w/c>BSC over toilet with mod assist as above with mod assist to manage LB clothing. Sitting on BSC over toilet with min assist  for trunk control due to continued ataxia/jerking posture with R lean - pt able to void bladder continently. Pt left sitting on BSC over toilet with OT present to assume care of patient.  Therapy Documentation Precautions:  Precautions Precautions: Fall Precaution Comments: bilateral mitts and bedrails padded Restrictions Weight Bearing Restrictions: No  Pain: No indications of pain during session.   Therapy/Group: Individual Therapy  Ginny Forth , PT, DPT, CSRS  10/09/2020, 8:00 AM

## 2020-10-09 NOTE — Patient Care Conference (Signed)
Inpatient RehabilitationTeam Conference and Plan of Care Update Date: 10/09/2020   Time: 10:01 AM    Patient Name: Cindy [name]      Medical Record Number: [id number]  Date of Birth: 1961-01-05 Sex: Female         Room/Bed: 4W08C/4W08C-01 Payor Info: Payor: MEDICAID [place] / Plan: MEDICAID OF [place] / Product Type: *No Product type* /    Admit Date/Time:  08/31/2020  5:12 PM  Primary Diagnosis:  Acute metabolic encephalopathy  Hospital Problems: Principal Problem:   Acute metabolic encephalopathy Active Problems:   Dysphagia   Agitation    Expected Discharge Date: Expected Discharge Date:  (snf)  Team Members Present: Physician leading conference: Dr. Faith Rogue Care Coodinator Present: [name], LCSWA;Dalesha Stanback Marlyne Beards, RN, BSN, CRRN Nurse Present: Kennyth Arnold, RN PT Present: Malachi Pro, PT OT Present: Jake Shark, OT SLP Present: Feliberto Gottron, SLP PPS Coordinator present : Fae Pippin, SLP     Current Status/Progress Goal Weekly Team Focus  Bowel/Bladder   patient is incontinent  restore continence  time toileting q2   Swallow/Nutrition/ Hydration   Dys. 1 textures with thin liquids, Mod A  Mod A  possible trials of Dys. 2 textures   ADL's   Slightly improved initiation of familiar motor plans, max A for all ADLs/mobility  POC adjusted to d/c ADLs and reflect caregiver total A at SNF level  ADLs, transfers, cognition- initiation, sequencing, termination   Mobility   Mod A overall, mod a gait with [place] walker with +2 w/c follow  mod A overall  Activity tolerance, functional mobility, stimulation tolerance, sitting tolerance, safety awareness, motor planning   Communication   Mod-Max A  Mod A  increased use of speech intelligibility strategies at word and phrase level   Safety/Cognition/ Behavioral Observations  Max A  Max A  initiation, attention   Pain   free of pain  remain free of pain  assess pain q shift   Skin   skin is intact  skin remain  intact and free from infection  assess skin q shift     Discharge Planning:  SNF pending approval from Corporate office due to insurance.   Team Discussion: No new medical updates. Incontinent B/B, not sleeping at night. Patient on target to meet rehab goals: Max assist for all ADL's. Mod assist for gait with [place] walker with +2 and W/C follow. Still on Dys1 diet, can't focus, but is more alert.  *See Care Plan and progress notes for long and short-term goals.   Revisions to Treatment Plan:  None at this time.  Teaching Needs: Family education, medication management, transfer training, gait training, balance training, endurance training, safety awareness.  Current Barriers to Discharge: Decreased caregiver support, Medical stability, Home enviroment access/layout, Incontinence, Lack of/limited family support, Insurance for SNF coverage, Medication compliance, Behavior and Nutritional means  Possible Resolutions to Barriers: Continue current medications, provide emotional support.     Medical Summary Current Status: behavior stable but still inconsistent. less agitated. intake fair  Barriers to Discharge: Medical stability;Behavior   Possible Resolutions to [name]: continued medical and environmental mgt of behavior   Continued Need for Acute Rehabilitation Level of Care: The patient requires daily medical management by a physician with specialized training in physical medicine and rehabilitation for the following reasons: Direction of a multidisciplinary physical rehabilitation program to maximize functional independence : Yes Medical management of patient stability for increased activity during participation in an intensive rehabilitation regime.: Yes Analysis of laboratory values  and/or radiology reports with any subsequent need for medication adjustment and/or medical intervention. : Yes   I attest that I was present, lead the team conference, and concur with  the assessment and plan of the team.   Tennis Must 10/09/2020, 1:55 PM

## 2020-10-09 NOTE — Progress Notes (Signed)
Patient ID: Cindy [name], female   DOB: 1961/02/10, 60 y.o.   MRN: [id number]   SWspoke withDebbie Powell[facility] [phone]) tofollow-up about updates from business office, to discuss referral. States corporate office contact was out and returning today. States she is waiting on follow-up, and should have an answer by the end of today.   [name], MSW, LCSWA Office: [phone] Cell: [phone] Fax: [phone]

## 2020-10-09 NOTE — Progress Notes (Signed)
Nutrition Follow-up  DOCUMENTATION CODES:   Non-severe (moderate) malnutrition in context of chronic illness  INTERVENTION:  Continue Ensure Enlive po TID, each supplement provides 350 kcal and 20 grams of protein.  Encourage adequate PO intake.   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 Cranial CT/MRI scan was performed and was negative for acute changes but did show remote lacunar infarct. EEG suggestive of cortical dysfunction arising from left hemisphere likely 2/2 underlying structural abnormality post ictal state no seizure or definite epileptiform discharges. Admitted to CIR 4/15.  Pt continues on a dysphagia 1 diet with thin liquids. Meal completion has been 60-100%. Pt currently has Ensure ordered with varied consumption. RD to continue with current orders to aid in caloric and protein needs. SNF placement pending.   Labs and medications reviewed.   Diet Order:   Diet Order            DIET - DYS 1 Room service appropriate? Yes; Fluid consistency: Thin  Diet effective now                 EDUCATION NEEDS:   Not appropriate for education at this time  Skin:  Skin Assessment: Reviewed RN Assessment  Last BM:  5/23  Height:   Ht Readings from Last 1 Encounters:  09/03/20 5\' 7"  (1.702 m)     Weight:   Wt Readings from Last 1 Encounters:  10/09/20 56.1 kg   BMI:  Body mass index is 19.37 kg/m.  Estimated Nutritional Needs:   Kcal:  1800-2000  Protein:  90-100 grams  Fluid:  >1.8L  10/11/20, MS, RD, LDN RD pager number/after hours weekend pager number on Amion.

## 2020-10-10 NOTE — Progress Notes (Signed)
Occupational Therapy Session Note  Patient Details  Name: Cindy [name] MRN: [id number] Date of Birth: [birth year]  Today's Date: 10/10/2020 OT Individual Time: 1037-1100 OT Individual Time Calculation (min): 23 min    Short Term Goals: Week 1:  OT Short Term Goal 1 (Week 1): Pt will demonstrate improved attention to grooming task for >1 min with mod A. OT Short Term Goal 1 - Progress (Week 1): Not met OT Short Term Goal 2 (Week 1): Pt will tolerate sitting EOB with mod A for >2 min. OT Short Term Goal 2 - Progress (Week 1): Met OT Short Term Goal 3 (Week 1): Pt will don shirt max A. OT Short Term Goal 3 - Progress (Week 1): Not met OT Short Term Goal 4 (Week 1): Pt will self-feed 25% of meal with mod A. OT Short Term Goal 4 - Progress (Week 1): Not met  Skilled Therapeutic Interventions/Progress Updates:    1;1. Pt received in bed awake with husband present. Pt requires total A to don pants at EOB with +2 to advance pants past hips. MAX A SPT to w/c. Pt completes 5' ambulation to/from w/c<>TIS with ataxic gait. Exited session with pt seated in TIS with belt alarm on, exit alarm on and call light in reach.    Therapy Documentation Precautions:  Precautions Precautions: Fall Precaution Comments: bilateral mitts and bedrails padded Restrictions Weight Bearing Restrictions: No General:   Vital Signs: Therapy Vitals Temp: 97.7 F (36.5 C) Pulse Rate: 74 Resp: 17 BP: 137/89 Patient Position (if appropriate): Lying Oxygen Therapy SpO2: 95 % O2 Device: Room Air Pain:   ADL: ADL Eating: Unable to assess Grooming: Unable to assess Upper Body Bathing: Unable to assess Lower Body Bathing: Unable to assess Upper Body Dressing: Unable to assess Lower Body Dressing: Dependent Where Assessed-Lower Body Dressing: Bed level Toileting: Unable to assess Toilet Transfer: Unable to assess Tub/Shower Transfer: Unable to assess Gaffer Transfer: Unable to assess Vision    Perception    Praxis   Exercises:   Other Treatments:     Therapy/Group: Individual Therapy  Tonny Branch 10/10/2020, 6:52 AM

## 2020-10-10 NOTE — Progress Notes (Signed)
Brother Clide Cliff called, wanted to know how pt was doing. Informed unable to provide any details about pt over the phone. Asked me to pass msg to pt.

## 2020-10-10 NOTE — Progress Notes (Signed)
Physical Therapy Session Note  Patient Details  Name: Cindy [name] MRN: [id number] Date of Birth: [birth year]  Today's Date: 10/10/2020 PT Individual Time: 1500-1526 PT Individual Time Calculation (min): 26 min   Short Term Goals: Week 5:  PT Short Term Goal 1 (Week 5): STG=LTG, patient is pending SNF placemet.  Skilled Therapeutic Interventions/Progress Updates:     Patient in bed upon PT arrival. Patient alert and cooperative throughout session. Patient did not present with indicators of pain throughout session.   Therapeutic Activity: Bed Mobility: Patient performed supine to/from sit with min A demonstrating improved initiation to cues this session. Provided verbal cues for placing both hands on the bed to come to siting for improved trunk support due to truncal ataxia, and bending her knees to bring her legs onto the bed with minimal facilitation. Transfers: Patient performed stand pivot bed<>TIS w/c and arm chair>TIS w/c with mod A. Provided multimodal cues for initiation and increased hip/knee/trunk extension to come to standing. Patient self-initiated reaching for arm rests of w/c for stability during transfers. Patient performed sit to/from stand x1 with min-mod A using [place] walker with breaks locked. Provided verbal cues for hand placement forward weight shift to stand.  Gait Training:  Patient ambulated 40 feet using Swedish walker with mod A, until patient saw a chair nearby and initiated sitting, requiring max A due to sitting away from the chair onto PT's thigh, able to pull chair under her and transfer to the chair with total A. Ambulated with decreased gait speed, decreased step length and height, with intermittent shuffling gait, increased B hip and knee flexion in stance, forward trunk lean, and downward head gaze. Provided verbal cues for increased step length, erect posture, increased gluteal activation for hip extension, and positive reinforcement and encouragement for  increased gait distance.  Wheelchair Mobility:  Patient was transported in the w/c with total A throughout session for energy conservation and time management.  Patient required increased time and rest breaks with all mobility due to decreased activity tolerance. Patient verbal throughout session with ~75% intelligibility. Patient able to recognize and name 4/5 object presented to her during session.   Patient in bed at end of session with breaks locked, bed and Posey belt alarm set, and all needs within reach.    Therapy Documentation Precautions:  Precautions Precautions: Fall Precaution Comments: bilateral mitts and bedrails padded Restrictions Weight Bearing Restrictions: No   Therapy/Group: Individual Therapy  [name] L Naimah Yingst PT, DPT  10/10/2020, 10:04 PM

## 2020-10-10 NOTE — Progress Notes (Signed)
Patient ID: Cindy [name], female   DOB: [birth year], 60 y.o.   MRN: [id number]  SW [name]  Reporting that additional paperwork is required. States she would like to meet with pt husband tomorrow (5/26) at 10am and he needs to bring in bank statements. She intends to call him. SW to try as well.   SW made several attempts to reach the husband Cindy [name] [phone]) but phone is off and unable to leave a message. SW will continue to make efforts.   [name], MSW, LCSWA Office: [phone] Cell: [phone] Fax: [phone]

## 2020-10-10 NOTE — Progress Notes (Signed)
Speech Language Pathology Daily Session Note  Patient Details  Name: Cindy [name] MRN: [id number] Date of Birth: August 23, 1960  Today's Date: 10/10/2020 SLP Individual Time: 1410-1435 SLP Individual Time Calculation (min): 25 min  Short Term Goals: Week 6: SLP Short Term Goal 1 (Week 6): STGs=LTGs due to ELOS  Skilled Therapeutic Interventions: Skilled treatment session focused on speech intelligibility goals. SLP facilitated session by providing Max A multimodal cues for use of speech intelligibility strategies at the word and phrase level to achieve ~50% intelligibility. Patient able to name functional items with 75% accuracy and answer a basic question regarding the item with 25% accuracy. Patient left upright in tilt-in-space at RN station with alarm on. Continue with current plan of care.      Pain No/Denies Pain   Therapy/Group: Individual Therapy  Scotti Motter 10/10/2020, 4:06 PM

## 2020-10-11 NOTE — Progress Notes (Signed)
Occupational Therapy Session Note  Patient Details  Name: Cindy [name] MRN: [id number] Date of Birth: 1960/08/28  Today's Date: 10/11/2020 OT Individual Time: 0813-8871 OT Individual Time Calculation (min): 23 min    Short Term Goals: Week 5:  OT Short Term Goal 1 (Week 5): Pt will tolerate sitting OOB in TIS at RN station 1x per day to improve OOB tolerance OT Short Term Goal 1 - Progress (Week 5): Progressing toward goal OT Short Term Goal 2 (Week 5): Pt will complete toilet transfer with MOD A OT Short Term Goal 2 - Progress (Week 5): Progressing toward goal OT Short Term Goal 3 (Week 5): Pt will don shirt wiht MAX A OT Short Term Goal 3 - Progress (Week 5): Met OT Short Term Goal 4 (Week 5): Pt will bathe UB with MOD A for thoroughness OT Short Term Goal 4 - Progress (Week 5): Progressing toward goal  Skilled Therapeutic Interventions/Progress Updates:    1:1.pt received in bed agreeable to OT. Pt requrires tMAX A for donning socks and pants at bed level  With OT bracing LEs and OT bridging to advance pants past hips. MOD A SPT to TIS and pt grooms at sink for face washing and oral care. Pt competles self feeding with MOD HOH A for pudding up at RN station. Exited session with pt seated in TIS, exit alarm on and call light in reach   Therapy Documentation Precautions:  Precautions Precautions: Fall Precaution Comments: bilateral mitts and bedrails padded Restrictions Weight Bearing Restrictions: No General:   Vital Signs: Therapy Vitals Temp: 98 F (36.7 C) Pulse Rate: 66 Resp: 18 BP: 120/68 Patient Position (if appropriate): Lying Oxygen Therapy SpO2: 98 % O2 Device: Room Air Pain:   ADL: ADL Eating: Unable to assess Grooming: Unable to assess Upper Body Bathing: Unable to assess Lower Body Bathing: Unable to assess Upper Body Dressing: Unable to assess Lower Body Dressing: Dependent Where Assessed-Lower Body Dressing: Bed level Toileting: Unable to  assess Toilet Transfer: Unable to assess Tub/Shower Transfer: Unable to assess Gaffer Transfer: Unable to assess Vision   Perception    Praxis   Exercises:   Other Treatments:     Therapy/Group: Individual Therapy  Tonny Branch 10/11/2020, 6:48 AM

## 2020-10-11 NOTE — Progress Notes (Signed)
Patient ID: Cindy [name], female   DOB: August 26, 1960, 60 y.o.   MRN: [id number]  SW made several attempts to reach the husband Cindy [name] [phone]) but just rings, and unable to leave a message. SW will continue to make efforts.   SW left message for Cindy [name]/ Admisisons with [facility] [phone]) to inquire if she was able to make contact with pt husband and informed on above. SW waiting on follow-up.   *SW saw pt husband Cindy [name] and informed on making contact with Cindy at [facility]. Reports he will go by SNF.   Cindy [name], MSW, LCSWA Office: [phone] Cell: [phone] Fax: [phone]

## 2020-10-11 NOTE — Progress Notes (Signed)
Physical Therapy Weekly Progress Note  Patient Details  Name: Cindy [name] MRN: [id number] Date of Birth: August 20, 1960  Beginning of progress report period: Oct 03, 2020 End of progress report period: Oct 11, 2020  Today's Date: 10/11/2020 PT Individual Time: 1005-1045 PT Individual Time Calculation (min): 40 min   Patient continues to make slow progress due to decreased motor planning and activity tolerance. Has demonstrated more consistent participation and OOB tolerance this week, sitting at the nurses station between sessions in TIS w/c, improving patients arousal level. Continues to demonstrate intermittent agitation when fatigued, over stimulated, or with frustration with toileting. Currently requires min-mod A for bed mobility and transfers, gait 40 ft with [place] walker, and supervision for dynamic sitting balance EOB. Patient continues to await SNF placement, please see CSW notes for updates.   Patient continues to demonstrate the following deficits muscle weakness, decreased cardiorespiratoy endurance, impaired timing and sequencing, abnormal tone, motor apraxia, ataxia, decreased coordination and decreased motor planning, decreased midline orientation and decreased attention to right, decreased initiation, decreased attention, decreased awareness, decreased problem solving, decreased safety awareness, decreased memory and delayed processing and decreased sitting balance, decreased standing balance, decreased postural control, decreased balance strategies and difficulty maintaining precautions and therefore will continue to benefit from skilled PT intervention to increase functional independence with mobility.  Patient progressing toward long term goals.  Continue plan of care.  PT Short Term Goals Week 5:  PT Short Term Goal 1 (Week 5): STG=LTG, patient is pending SNF placemet. PT Short Term Goal 1 - Progress (Week 5): Progressing toward goal Week 6:  PT Short Term Goal 1 (Week 6):  STG=LTG, patient is pending SNF placemet.  Skilled Therapeutic Interventions/Progress Updates:     Patient in TIS w/c at nurses station upon PT arrival. Patient alert and agreeable to PT session. Patient denied pain during session. Patient more alert and very engaged in therapy session today. Verbally motivated to progress with gait training, patient set a goal to walk to end of the hall. Patient also engaged in appropriate social interactions with her husband and CSW during session.   Therapeutic Activity: Bed Mobility: Patient performed sit to supine with min A for lower extremity managemnt. Provided verbal cues for use of her L elbow to lower her trunk and bring her knees to her chest to bring her legs onto the bed. Transfers: Patient performed sit to/from stand x3 with [place] walker with min A, mod A to sit with fatigue. She performed stand pivot TIS w/c<>BSC over toilet and TIS w/c>bed with mod A. Provided verbal cues for initiation and target to sit for improved awareness during transfer. Patient was incontinent and continent of bladder during toileting, required total A for peri-care and lower body clothing management.  Gait Training:  Patient ambulated 27 feet, 38 feet, and 40 feet using Swedish walker with min-mod A and +2 for w/c follow. Ambulated with narrow BOS with ataxic gait, intermittent shuffling gait, significant forward trunk flexion, and increased knee extension/PF throughout. Provided multimodal cues for erect posture and hip extension, increased step length and BOS, and positive reinforcement for gait progression.  Wheelchair Mobility:  Patient was transported in the w/c with total A throughout session for energy conservation and time management.  Patient in bed with her husband at bedside at end of session with breaks locked, bed and Posey belt alarm set, and all needs within reach.   Therapy Documentation Precautions:  Precautions Precautions: Fall Precaution Comments:  bilateral mitts and bedrails padded  Restrictions Weight Bearing Restrictions: No  Therapy/Group: Individual Therapy  Raniah Karan L Jadarius Commons PT, DPT  10/11/2020, 12:47 PM

## 2020-10-12 NOTE — Progress Notes (Signed)
Physical Therapy Session Note  Patient Details  Name: Cindy [name] MRN: [id number] Date of Birth: July 01, 1960  Today's Date: 10/12/2020 PT Individual Time: 0815-0830 PT Individual Time Calculation (min): 15 min   Short Term Goals: Week 1:  PT Short Term Goal 1 (Week 1): pt to demonstrated supine<>sit max A x1 PT Short Term Goal 1 - Progress (Week 1): Met PT Short Term Goal 2 (Week 1): pt to demonstrate sitting balance 5 min at mod A x1 PT Short Term Goal 2 - Progress (Week 1): Met PT Short Term Goal 3 (Week 1): pt to demonstrate bed<>chair transfers max A x1 PT Short Term Goal 3 - Progress (Week 1): Partly met (inconsistent) PT Short Term Goal 4 (Week 1): pt to tolerate sitting OOB 1 hour for improved participation with therapy PT Short Term Goal 4 - Progress (Week 1): Progressing toward goal PT Short Term Goal 5 (Week 1): Initiate gait training PT Short Term Goal 5 - Progress (Week 1): Met Week 2:  PT Short Term Goal 1 (Week 2): Patient will perform bed mobility with mod A >50% of the time. PT Short Term Goal 1 - Progress (Week 2): Progressing toward goal PT Short Term Goal 2 (Week 2): Patient will perform bed>chair transfers with max A consitently. PT Short Term Goal 2 - Progress (Week 2): Progressing toward goal PT Short Term Goal 3 (Week 2): Patient will perform standing balance >1 min with mod-max A. PT Short Term Goal 3 - Progress (Week 2): Met PT Short Term Goal 4 (Week 2): Patient will ambulate >25 feet with +2 assist. PT Short Term Goal 4 - Progress (Week 2): Met Week 3:  PT Short Term Goal 1 (Week 3): STG=LTG due to ELOS. PT Short Term Goal 1 - Progress (Week 3): Progressing toward goal Week 4:  PT Short Term Goal 1 (Week 4): STG=LTG, patient is pending SNF placemet. PT Short Term Goal 1 - Progress (Week 4): Progressing toward goal  Skilled Therapeutic Interventions/Progress Updates:    pt received in bed sleeping soundly and awoke to name and light touch. Pt directed in  face washing at bed level with max VC for initiation and sequencing and required extra time to complete. Pt directed in bed mobility for improved positioning to middle of bed and improved BLE placement in bed. Pt asked for socks for feet and given them at bed level, pt directed in donning BLE socks min A. Pt left in bed, alarm set, posey belt set, All needs in reach and in good condition. Call light in hand.    Therapy Documentation Precautions:  Precautions Precautions: Fall Precaution Comments: bilateral mitts and bedrails padded Restrictions Weight Bearing Restrictions: No General:   Vital Signs:   Pain: Pain Assessment Pain Scale: 0-10 Pain Score: 0-No pain Mobility:   Locomotion :    Trunk/Postural Assessment :    Balance:   Exercises:   Other Treatments:      Therapy/Group: Individual Therapy  Junie Panning 10/12/2020, 12:22 PM

## 2020-10-12 NOTE — Progress Notes (Signed)
PROGRESS NOTE   Subjective/Complaints: Up in bed. Fidgety, but appears comfortable  Objective:   No results found. No results for input(s): WBC, HGB, HCT, PLT in the last 72 hours. No results for input(s): NA, K, CL, CO2, GLUCOSE, BUN, CREATININE, CALCIUM in the last 72 hours.  Intake/Output Summary (Last 24 hours) at 10/12/2020 1016 Last data filed at 10/12/2020 0900 Gross per 24 hour  Intake 934 ml  Output --  Net 934 ml        Physical Exam: Vital Signs Blood pressure 132/88, pulse 72, temperature 97.9 F (36.6 C), resp. rate 18, height 5\' 7"  (1.702 m), weight 56.6 kg, SpO2 100 %.  Constitutional: No distress . Vital signs reviewed. HEENT: EOMI, oral membranes moist Neck: supple Cardiovascular: RRR without murmur. No JVD    Respiratory/Chest: CTA Bilaterally without wheezes or rales. Normal effort    GI/Abdomen: BS +, non-tender, non-distended Ext: no clubbing, cyanosis, or edema Psych: pleasant and cooperative Musc: No edema in extremities.  No tenderness in extremities. Neuro: Alert, confused, follows basic commands, answers simple questions Motor:>/ 3-4/5 throughout   Assessment/Plan: 1. Functional deficits which require 3+ hours per day of interdisciplinary therapy in a comprehensive inpatient rehab setting.  Physiatrist is providing close team supervision and 24 hour management of active medical problems listed below.  Physiatrist and rehab team continue to assess barriers to discharge/monitor patient progress toward functional and medical goals  Care Tool:  Bathing  Bathing activity did not occur: Safety/medical concerns Body parts bathed by patient: Face   Body parts bathed by helper: Right arm,Left arm,Chest,Abdomen,Front perineal area,Buttocks,Right upper leg,Left upper leg,Left lower leg,Right lower leg,Face Body parts n/a: Left lower leg,Left upper leg,Right upper leg,Right lower  leg,Buttocks,Abdomen,Front perineal area,Left arm,Right arm,Chest   Bathing assist Assist Level: Total Assistance - Patient < 25%     Upper Body Dressing/Undressing Upper body dressing Upper body dressing/undressing activity did not occur (including orthotics): Safety/medical concerns What is the patient wearing?: Pull over shirt    Upper body assist Assist Level: Total Assistance - Patient < 25%    Lower Body Dressing/Undressing Lower body dressing      What is the patient wearing?: Incontinence brief,Pants     Lower body assist Assist for lower body dressing: Dependent - Patient 0%     Toileting Toileting Toileting Activity did not occur (Clothing management and hygiene only): N/A (no void or bm)  Toileting assist Assist for toileting: Maximal Assistance - Patient 25 - 49% (bedlevel/brief change)     Transfers Chair/bed transfer  Transfers assist  Chair/bed transfer activity did not occur: Safety/medical concerns  Chair/bed transfer assist level: Moderate Assistance - Patient 50 - 74%     Locomotion Ambulation   Ambulation assist   Ambulation activity did not occur: Safety/medical concerns  Assist level: Moderate Assistance - Patient 50 - 74% Assistive device: Max distance: 40 ft   Walk 10 feet activity   Assist  Walk 10 feet activity did not occur: Safety/medical concerns  Assist level: Moderate Assistance - Patient - 50 - 74% Assistive device: Walker-Eva   Walk 50 feet activity   Assist Walk 50 feet with 2 turns activity did  not occur: Safety/medical concerns  Assist level: 2 helpers Assistive device: Walker-Eva    Walk 150 feet activity   Assist Walk 150 feet activity did not occur: Safety/medical concerns         Walk 10 feet on uneven surface  activity   Assist Walk 10 feet on uneven surfaces activity did not occur: Safety/medical concerns         Wheelchair     Assist Will patient use wheelchair at  discharge?: Yes (pt may benefit from [facility] assessment however unsafe to attempt this AM; per PT long term goals)   Wheelchair activity did not occur: Safety/medical concerns         Wheelchair 50 feet with 2 turns activity    Assist    Wheelchair 50 feet with 2 turns activity did not occur: Safety/medical concerns       Wheelchair 150 feet activity     Assist  Wheelchair 150 feet activity did not occur: Safety/medical concerns       Blood pressure 132/88, pulse 72, temperature 97.9 F (36.6 C), resp. rate 18, height 5\' 7"  (1.702 m), weight 56.6 kg, SpO2 100 %.  Medical Problem List and Plan: 1.  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             - SNF placement still pending.   - Advanced WM changes on MRI 4/12 likely related to SVD. Pt + for cocaine on admit.    Continue CIR   2.  Impaired mobility -DVT/anticoagulation: Continue Lovenox             -antiplatelet therapy: Aspirin 325 mg 3. Pain Management: Continue Tylenol as needed 4. Agitation/psychosis: Antipsychotic: Continue zyprexa 5mg  hs and 2.5mg  daily  - Continue bilateral mittens as needed  -depakote 500mg  tid  -melatonin 3mg  HS    -limit prn haldol    -continue both doses of zyprexa 5. Neuropsych: This patient is capable of making decisions on her own behalf. 6. Skin/Wound Care: Routine skin checks 7. Fluids/Electrolytes/Nutrition:    I personally reviewed the patient's labs today.    -push PO fluids, BUN sl elevated---recheck BMET today5/27 Filed Weights   10/09/20 0410 10/10/20 0500 10/11/20 0412  Weight: 56.1 kg 56.6 kg 56.6 kg    Stable  8.  Seizure disorder.  Vimpat 200 mg twice daily, valproic acid 1000 mg twice daily.  EEG negative  No breakthrough seizures on 5/22 9.  Hypertension.  Norvasc 10 mg daily     Continue lopressor 25mg  bid  Controlled on 5/27 10.  History of bilateral  cerebral convexity infarction frontal region.  CIR 08/19/2019-08/27/2019 11.  Hyperlipidemia.  Continue Lipitor 12.  History of polysubstance abuse.  Urine drug screen positive cocaine.   13. AKI:   Creatinine 1.17 on 5/23 14. UTI: UCX never performed.     -f/u urine cx negative 15. Dysphagia:     -D1, thins  Intake has picked up  LOS: 42 days A FACE TO FACE EVALUATION WAS PERFORMED  10/12/2020, 10:16 AM

## 2020-10-12 NOTE — Progress Notes (Signed)
Speech Language Pathology Weekly Progress Note  Patient Details  Name: Cindy [name] MRN: [id number] Date of Birth: Mar 22, 1961  Beginning of progress report period: Oct 05, 2020 End of progress report period: Oct 12, 2020    Short Term Goals: Week 6: SLP Short Term Goal 1 (Week 6): STGs=LTGs due to ELOS SLP Short Term Goal 1 - Progress (Week 6): Not met    New Short Term Goals: Week 7: SLP Short Term Goal 1 (Week 7): STGs=LTGs due to ELOS  Weekly Progress Updates: Patient has made minimal gains this reporting period. Patient continues to demonstrate increased periods of arousal and alertness but continues to require Max-Total A to complete functional and familiar tasks in regards to attention and problem solving. Patient is consuming Dys. 1 textures with thin liquids with minimal overt s/s of aspiration with Mod verbal cues needed for safety. Patient demonstrates intermittent periods of increased speech intelligibility at the word and phrase level but is overall Mod-Max A multimodal cues for use of speech intelligibility strategies. Patient and family education ongoing. Patient would benefit from continued skilled SLP intervention to maximize her cognitive, speech and swallowing function prior to discharge.      Intensity: Minumum of 1-2 x/day, 30 to 90 minutes Frequency: 3 to 5 out of 7 days Duration/Length of Stay: waiting SNF placement Treatment/Interventions: Cognitive remediation/compensation;Speech/Language facilitation;Therapeutic Activities;Therapeutic Exercise;Dysphagia/aspiration precaution training;Patient/family education;Environmental controls;Cueing hierarchy;Functional tasks     Norman Piacentini, [place] 10/12/2020, 6:27 AM

## 2020-10-12 NOTE — Progress Notes (Signed)
Occupational Therapy Session Note  Patient Details  Name: Cindy [name] MRN: [id number] Date of Birth: 08/14/1960  Today's Date: 10/12/2020 OT Individual Time: 1601-0932 OT Individual Time Calculation (min): 25 min    Short Term Goals: Week 1:  OT Short Term Goal 1 (Week 1): Pt will demonstrate improved attention to grooming task for >1 min with mod A. OT Short Term Goal 1 - Progress (Week 1): Not met OT Short Term Goal 2 (Week 1): Pt will tolerate sitting EOB with mod A for >2 min. OT Short Term Goal 2 - Progress (Week 1): Met OT Short Term Goal 3 (Week 1): Pt will don shirt max A. OT Short Term Goal 3 - Progress (Week 1): Not met OT Short Term Goal 4 (Week 1): Pt will self-feed 25% of meal with mod A. OT Short Term Goal 4 - Progress (Week 1): Not met  Skilled Therapeutic Interventions/Progress Updates:    1:1. Pt received in bed agreeable to OT and awake upon entering room. Overall pt requires MAX A for components of LB dressing d/t ataxia, however pt appropriately initiates and sequences tasks. Grooming at sink for face washing and hair brushing with S using built up handle. Pt completes 2 min visual scanning at bits locating 11 targets in 2 min with trunk/UE ataxia/undershooting increasing effort and decreasing efficiency with task. Exited session with pt seated in TIS at RN station, exit alarm on and call light in reach   Therapy Documentation Precautions:  Precautions Precautions: Fall Precaution Comments: bilateral mitts and bedrails padded Restrictions Weight Bearing Restrictions: No General:   Vital Signs: Therapy Vitals Temp: 97.9 F (36.6 C) Pulse Rate: 72 Resp: 18 BP: 132/88 Patient Position (if appropriate): Lying Oxygen Therapy SpO2: 100 % O2 Device: Room Air Pain:   ADL: ADL Eating: Unable to assess Grooming: Unable to assess Upper Body Bathing: Unable to assess Lower Body Bathing: Unable to assess Upper Body Dressing: Unable to assess Lower Body  Dressing: Dependent Where Assessed-Lower Body Dressing: Bed level Toileting: Unable to assess Toilet Transfer: Unable to assess Tub/Shower Transfer: Unable to assess Gaffer Transfer: Unable to assess Vision   Perception    Praxis   Exercises:   Other Treatments:     Therapy/Group: Individual Therapy  Tonny Branch 10/12/2020, 6:49 AM

## 2020-10-13 MED ORDER — ORAL CARE MOUTH RINSE
15.0000 mL | Freq: Two times a day (BID) | OROMUCOSAL | Status: DC
  Administered 2020-10-13 – 2020-10-27 (×24): 15 mL via OROMUCOSAL

## 2020-10-13 NOTE — Progress Notes (Signed)
PROGRESS NOTE   Subjective/Complaints: Found her in bed sleeping. Slow to arouse.  ROS: Limited due to cognitive/behavioral I I  Objective:   No results found. No results for input(s): WBC, HGB, HCT, PLT in the last 72 hours. No results for input(s): NA, K, CL, CO2, GLUCOSE, BUN, CREATININE, CALCIUM in the last 72 hours.  Intake/Output Summary (Last 24 hours) at 10/13/2020 0952 Last data filed at 10/13/2020 0805 Gross per 24 hour  Intake 377 ml  Output --  Net 377 ml        Physical Exam: Vital Signs Blood pressure 109/74, pulse 67, temperature 97.9 F (36.6 C), temperature source Oral, resp. rate 16, height 5\' 7"  (1.702 m), weight 55.8 kg, SpO2 99 %.  Constitutional: No distress . Vital signs reviewed. HEENT: EOMI, oral membranes moist Neck: supple Cardiovascular: RRR without murmur. No JVD    Respiratory/Chest: CTA Bilaterally without wheezes or rales. Normal effort    GI/Abdomen: BS +, non-tender, non-distended Ext: no clubbing, cyanosis, or edema Psych: confused, can be restless Musc: No edema in extremities.  No tenderness in extremities. Neuro: Alert, confused, follows basic commands, answers simple questions Motor:>/ 3-4/5 throughout   Assessment/Plan: 1. Functional deficits which require 3+ hours per day of interdisciplinary therapy in a comprehensive inpatient rehab setting.  Physiatrist is providing close team supervision and 24 hour management of active medical problems listed below.  Physiatrist and rehab team continue to assess barriers to discharge/monitor patient progress toward functional and medical goals  Care Tool:  Bathing  Bathing activity did not occur: Safety/medical concerns Body parts bathed by patient: Face   Body parts bathed by helper: Right arm,Left arm,Chest,Abdomen,Front perineal area,Buttocks,Right upper leg,Left upper leg,Left lower leg,Right lower leg,Face Body parts n/a:  Left lower leg,Left upper leg,Right upper leg,Right lower leg,Buttocks,Abdomen,Front perineal area,Left arm,Right arm,Chest   Bathing assist Assist Level: Total Assistance - Patient < 25%     Upper Body Dressing/Undressing Upper body dressing Upper body dressing/undressing activity did not occur (including orthotics): Safety/medical concerns What is the patient wearing?: Pull over shirt    Upper body assist Assist Level: Total Assistance - Patient < 25%    Lower Body Dressing/Undressing Lower body dressing      What is the patient wearing?: Incontinence brief,Pants     Lower body assist Assist for lower body dressing: Dependent - Patient 0%     Toileting Toileting Toileting Activity did not occur (Clothing management and hygiene only): N/A (no void or bm)  Toileting assist Assist for toileting: Maximal Assistance - Patient 25 - 49% (bedlevel/brief change)     Transfers Chair/bed transfer  Transfers assist  Chair/bed transfer activity did not occur: Safety/medical concerns  Chair/bed transfer assist level: Moderate Assistance - Patient 50 - 74%     Locomotion Ambulation   Ambulation assist   Ambulation activity did not occur: Safety/medical concerns  Assist level: Moderate Assistance - Patient 50 - 74% Assistive device: Max distance: 40 ft   Walk 10 feet activity   Assist  Walk 10 feet activity did not occur: Safety/medical concerns  Assist level: Moderate Assistance - Patient - 50 - 74% Assistive device: Walker-Eva   Walk 50  feet activity   Assist Walk 50 feet with 2 turns activity did not occur: Safety/medical concerns  Assist level: 2 helpers Assistive device: Walker-Eva    Walk 150 feet activity   Assist Walk 150 feet activity did not occur: Safety/medical concerns         Walk 10 feet on uneven surface  activity   Assist Walk 10 feet on uneven surfaces activity did not occur: Safety/medical concerns          Wheelchair     Assist Will patient use wheelchair at discharge?: Yes (pt may benefit from [facility] assessment however unsafe to attempt this AM; per PT long term goals)   Wheelchair activity did not occur: Safety/medical concerns         Wheelchair 50 feet with 2 turns activity    Assist    Wheelchair 50 feet with 2 turns activity did not occur: Safety/medical concerns       Wheelchair 150 feet activity     Assist  Wheelchair 150 feet activity did not occur: Safety/medical concerns       Blood pressure 109/74, pulse 67, temperature 97.9 F (36.6 C), temperature source Oral, resp. rate 16, height 5\' 7"  (1.702 m), weight 55.8 kg, SpO2 99 %.  Medical Problem List and Plan: 1.  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             - SNF placement still pending.   - Advanced WM changes on MRI 4/12 likely related to SVD. Pt + for cocaine on admit.    -Continue CIR therapies including PT, OT   2.  Impaired mobility -DVT/anticoagulation: Continue Lovenox             -antiplatelet therapy: Aspirin 325 mg 3. Pain Management: Continue Tylenol as needed 4. Agitation/psychosis: Antipsychotic: Continue zyprexa 5mg  hs and 2.5mg  daily  - Continue bilateral mittens as needed  -depakote 500mg  tid  -melatonin 3mg  HS    -limit prn haldol    -continue same doses of zyprexa 5. Neuropsych: This patient is capable of making decisions on her own behalf. 6. Skin/Wound Care: Routine skin checks 7. Fluids/Electrolytes/Nutrition:    I personally reviewed the patient's labs today.    -push PO fluids, BUN sl elevated---recheck BMET today5/27 Filed Weights   10/10/20 0500 10/11/20 0412 10/13/20 0500  Weight: 56.6 kg 56.6 kg 55.8 kg    Stable 5/28 8.  Seizure disorder.  Vimpat 200 mg twice daily, valproic acid 1000 mg twice daily.  EEG negative  No breakthrough seizures on 5/22 9.   Hypertension.  Norvasc 10 mg daily     Continue lopressor 25mg  bid  Controlled on 5/28 10.  History of bilateral cerebral convexity infarction frontal region.  CIR 08/19/2019-08/27/2019 11.  Hyperlipidemia.  Continue Lipitor 12.  History of polysubstance abuse.  Urine drug screen positive cocaine.   13. AKI:   Creatinine 1.17 on 5/23 14. UTI: UCX never performed.     -f/u urine cx negative 15. Dysphagia:     -D1, thins  Intake has picked up  LOS: 43 days A FACE TO FACE EVALUATION WAS PERFORMED  10/13/2020, 9:52 AM

## 2020-10-14 LAB — BASIC METABOLIC PANEL
Anion gap: 7 (ref 5–15)
BUN: 38 mg/dL — ABNORMAL HIGH (ref 8–23)
CO2: 26 mmol/L (ref 22–32)
Calcium: 9.1 mg/dL (ref 8.9–10.3)
Chloride: 105 mmol/L (ref 98–111)
Creatinine, Ser: 1.21 mg/dL — ABNORMAL HIGH (ref 0.44–1.00)
GFR, Estimated: 51 mL/min — ABNORMAL LOW (ref >60)
Glucose, Bld: 95 mg/dL (ref 70–99)
Potassium: 4.6 mmol/L (ref 3.5–5.1)
Sodium: 138 mmol/L (ref 135–145)

## 2020-10-14 NOTE — Plan of Care (Signed)
  Problem: RH BLADDER ELIMINATION Goal: RH STG MANAGE BLADDER WITH ASSISTANCE Description: STG Manage Bladder With min Assistance Outcome: Not Progressing; incontinence   Problem: RH BOWEL ELIMINATION Goal: RH STG MANAGE BOWEL WITH ASSISTANCE Description: STG Manage Bowel with min Assistance. Outcome: Not Progressing; incontinence   

## 2020-10-15 NOTE — Progress Notes (Signed)
Physical Therapy Session Note  Patient Details  Name: Cindy [name] MRN: [id number] Date of Birth: 05/22/1960  Today's Date: 10/15/2020 PT Individual Time: 1250-1305 PT Individual Time Calculation (min): 15 min   Short Term Goals: Week 6:  PT Short Term Goal 1 (Week 6): STG=LTG, patient is pending SNF placemet.  Skilled Therapeutic Interventions/Progress Updates:      1130: Patient in TIS w/c with her husband in the room upon PT arrival. Patient very alert and cheerful stating "hey girl" and "my husband came to see me." Patient speech was clear and easy to understand. Patient appropriately requested to spend time with her husband, who stated he would be leaving in 30 min. PT left to return in 30 min to attempt to initiate session.  1230: Patient's husband in the bathroom voiding with the door open up PT entry, PT politely exited and apologized and offered to return at a later time.   1250:  Patient in TIS w/c alone in the room with seat belt alarm set watching TV upon PT arrival. Patient alert and requesting to return to the bed. Patient denied pain during session. RN made aware that patient's husband had left without informing nursing staff and left patient in the room unsupervised.   Patient resistent to getting back to bed after her request, stating "I don't want to." transported patient to the nurses station and the patient declined sitting at he nurses station and requested to sit in her room and watch TV. Educated patient about safety concerns with patient alone in the room due to reduced trunk control and arousal placing patient at risk to fall. Patient stated understanding and agreed to return to the bed to watch TV. Transported patient back to her room. MD arrived to round on patient and patient maintained polite conversation, did state that she was at [facility] in [place]. Patient then performed squat pviot TIS w/c>bed with mod-min A with cues for initiation and forward  weight shift. She performed supine to sit with min A for lower extremity management and scooting up in bed x2 with use of lower extremities with supervision and mod cues for technique.   Patient in bed at end of session with breaks locked, bed and Posey belt alarm set, and all needs within reach.   Therapy Documentation Precautions:  Precautions Precautions: Fall Precaution Comments: bilateral mitts and bedrails padded Restrictions Weight Bearing Restrictions: No General: PT Amount of Missed Time (min): 15 Minutes PT Missed Treatment Reason: Patient unwilling to participate   Therapy/Group: Individual Therapy  [place] Bellemare L Lamyiah Crawshaw PT, DPT  10/15/2020, 4:33 PM

## 2020-10-15 NOTE — Progress Notes (Signed)
SLP Cancellation Note  Patient Details Name: Cindy [name] MRN: [id number] DOB: 10/13/1960   Cancelled treatment:        Pt unable to wake up for slp tx. Attempted x2 however pt remained asleep.                                                                                                 [name] A Lorry Anastasi 10/15/2020, 3:07 PM

## 2020-10-15 NOTE — Progress Notes (Signed)
Occupational Therapy Session Note  Patient Details  Name: Cindy [name] MRN: [id number] Date of Birth: [birth year]  Today's Date: 10/15/2020 OT Individual Time: 0935-1000 OT Individual Time Calculation (min): 25 min    Short Term Goals: Week 6:  OT Short Term Goal 1 (Week 6): STG = LTG d/t ELOS  Skilled Therapeutic Interventions/Progress Updates:    Pt received supine sleeping soundly. Once awoken pt was very alert, surprisingly intelligible and stating "let me get some jogger pants on". She required max A to doff wet incontinence brief and to don new pants supine. Improvement in automatic movements in dressing sequence. Pt began laughing hysterically stating "I hate to tell you this but now I have to pee!" Pt was assisted to EOB with max A. Max A for stand pivot transfer to the TIS w/c. Pt completed another stand pivot transfer to [facility] over the toilet with max A. After several minutes pt was able to void small amount of urine. Total A to pull up pants and brief in standing. Shirt donned with only min A! Pt was left sitting up in the TIS w/c at the nurses desk. Chair alarm belt set.   Therapy Documentation Precautions:  Precautions Precautions: Fall Precaution Comments: bilateral mitts and bedrails padded Restrictions Weight Bearing Restrictions: No  Therapy/Group: Individual Therapy  Crissie Reese 10/15/2020, 6:29 AM

## 2020-10-15 NOTE — Progress Notes (Signed)
PROGRESS NOTE   Subjective/Complaints:  Working with PT this am , no new issues per PT, following commands a little better   ROS: Limited due to cognitive/behavioral I I  Objective:   No results found. No results for input(s): WBC, HGB, HCT, PLT in the last 72 hours. Recent Labs    10/14/20 0539  NA 138  K 4.6  CL 105  CO2 26  GLUCOSE 95  BUN 38*  CREATININE 1.21*  CALCIUM 9.1    Intake/Output Summary (Last 24 hours) at 10/15/2020 1244 Last data filed at 10/15/2020 1100 Gross per 24 hour  Intake 760 ml  Output --  Net 760 ml        Physical Exam: Vital Signs Blood pressure 110/81, pulse 84, temperature 97.9 F (36.6 C), resp. rate 18, height 5\' 7"  (1.702 m), weight 58.2 kg, SpO2 100 %.   General: No acute distress Mood and affect are appropriate Heart: Regular rate and rhythm no rubs murmurs or extra sounds Lungs: Clear to auscultation, breathing unlabored, no rales or wheezes Abdomen: Positive bowel sounds, soft nontender to palpation, nondistended Extremities: No clubbing, cyanosis, or edema Skin: No evidence of breakdown, no evidence of rash Neurologic: , motor strength is 4/5 in bilateral deltoid, bicep, tricep, grip, hip flexor, knee extensors, ankle dorsiflexor and plantar flexor Poor sitting balance   Psych: confused, can be restless Musc: No edema in extremities.  No tenderness in extremities. Neuro: Alert, confused, follows basic commands, answers simple questions    Assessment/Plan: 1. Functional deficits which require 3+ hours per day of interdisciplinary therapy in a comprehensive inpatient rehab setting.  Physiatrist is providing close team supervision and 24 hour management of active medical problems listed below.  Physiatrist and rehab team continue to assess barriers to discharge/monitor patient progress toward functional and medical goals  Care Tool:  Bathing  Bathing  activity did not occur: Safety/medical concerns Body parts bathed by patient: Face   Body parts bathed by helper: Right arm,Left arm,Chest,Abdomen,Front perineal area,Buttocks,Right upper leg,Left upper leg,Left lower leg,Right lower leg,Face Body parts n/a: Left lower leg,Left upper leg,Right upper leg,Right lower leg,Buttocks,Abdomen,Front perineal area,Left arm,Right arm,Chest   Bathing assist Assist Level: Total Assistance - Patient < 25%     Upper Body Dressing/Undressing Upper body dressing Upper body dressing/undressing activity did not occur (including orthotics): Safety/medical concerns What is the patient wearing?: Pull over shirt    Upper body assist Assist Level: Total Assistance - Patient < 25%    Lower Body Dressing/Undressing Lower body dressing      What is the patient wearing?: Incontinence brief,Pants     Lower body assist Assist for lower body dressing: Dependent - Patient 0%     Toileting Toileting Toileting Activity did not occur (Clothing management and hygiene only): N/A (no void or bm)  Toileting assist Assist for toileting: Maximal Assistance - Patient 25 - 49% (bedlevel/brief change)     Transfers Chair/bed transfer  Transfers assist  Chair/bed transfer activity did not occur: Safety/medical concerns  Chair/bed transfer assist level: Moderate Assistance - Patient 50 - 74%     Locomotion Ambulation   Ambulation assist   Ambulation activity did not occur:  Safety/medical concerns  Assist level: Moderate Assistance - Patient 50 - 74% Assistive device: Fara Boros Max distance: 40 ft   Walk 10 feet activity   Assist  Walk 10 feet activity did not occur: Safety/medical concerns  Assist level: Moderate Assistance - Patient - 50 - 74% Assistive device: Walker-Eva   Walk 50 feet activity   Assist Walk 50 feet with 2 turns activity did not occur: Safety/medical concerns  Assist level: 2 helpers Assistive device: Walker-Eva    Walk  150 feet activity   Assist Walk 150 feet activity did not occur: Safety/medical concerns         Walk 10 feet on uneven surface  activity   Assist Walk 10 feet on uneven surfaces activity did not occur: Safety/medical concerns         Wheelchair     Assist Will patient use wheelchair at discharge?: Yes (pt may benefit from [facility] assessment however unsafe to attempt this AM; per PT long term goals)   Wheelchair activity did not occur: Safety/medical concerns         Wheelchair 50 feet with 2 turns activity    Assist    Wheelchair 50 feet with 2 turns activity did not occur: Safety/medical concerns       Wheelchair 150 feet activity     Assist  Wheelchair 150 feet activity did not occur: Safety/medical concerns       Blood pressure 110/81, pulse 84, temperature 97.9 F (36.6 C), resp. rate 18, height 5\' 7"  (1.702 m), weight 58.2 kg, SpO2 100 %.  Medical Problem List and Plan: 1.  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             - SNF placement still pending.   - Advanced WM changes on MRI 4/12 likely related to SVD. Pt + for cocaine on admit.    -Continue CIR therapies including PT, OT   2.  Impaired mobility -DVT/anticoagulation: Continue Lovenox             -antiplatelet therapy: Aspirin 325 mg 3. Pain Management: Continue Tylenol as needed 4. Agitation/psychosis: Antipsychotic: Continue zyprexa 5mg  hs and 2.5mg  daily  - Continue bilateral mittens as needed  -depakote 500mg  tid  -melatonin 3mg  HS    -limit prn haldol    -continue same doses of zyprexa 5. Neuropsych: This patient is capable of making decisions on her own behalf. 6. Skin/Wound Care: Routine skin checks 7. Fluids/Electrolytes/Nutrition:    I personally reviewed the patient's labs today.    -push PO fluids, BUN sl elevated---recheck BMET today5/27 Filed Weights   10/13/20  0500 10/14/20 0425 10/15/20 0359  Weight: 55.8 kg 57.9 kg 58.2 kg    Stable 5/28 8.  Seizure disorder.  Vimpat 200 mg twice daily, valproic acid 1000 mg twice daily.  EEG negative  No breakthrough seizures on 5/30 9.  Hypertension.  Norvasc 10 mg daily     Continue lopressor 25mg  bid Vitals:   10/15/20 0359 10/15/20 1238  BP: 139/89 110/81  Pulse: 74 84  Resp: 18 18  Temp: 98.2 F (36.8 C) 97.9 F (36.6 C)  SpO2: 99% 100%  controlled 5/30 10.  History of bilateral cerebral convexity infarction frontal region.  CIR 08/19/2019-08/27/2019 11.  Hyperlipidemia.  Continue Lipitor 12.  History of polysubstance abuse.  Urine drug screen positive cocaine.   13. AKI:   Creatinine 1.21 on 5/29, BUN still elevated  14. UTI: UCX never performed.     -f/u urine cx negative 15. Dysphagia:     -D1, thins  Intake has picked up  LOS: 45 days A FACE TO FACE EVALUATION WAS PERFORMED  Erick Colace 10/15/2020, 12:44 PM

## 2020-10-16 NOTE — Progress Notes (Signed)
PROGRESS NOTE   Subjective/Complaints:  Pt up at nurses station. No new complaints. Appears calm, comfortable   ROS: Limited due to cognitive/behavioral     Objective:   No results found. No results for input(s): WBC, HGB, HCT, PLT in the last 72 hours. Recent Labs    10/14/20 0539  NA 138  K 4.6  CL 105  CO2 26  GLUCOSE 95  BUN 38*  CREATININE 1.21*  CALCIUM 9.1    Intake/Output Summary (Last 24 hours) at 10/16/2020 1206 Last data filed at 10/16/2020 0843 Gross per 24 hour  Intake 485 ml  Output --  Net 485 ml        Physical Exam: Vital Signs Blood pressure 107/87, pulse 73, temperature 98 F (36.7 C), resp. rate 16, height 5\' 7"  (1.702 m), weight 57 kg, SpO2 100 %.   Constitutional: No distress . Vital signs reviewed. HEENT: EOMI, oral membranes moist Neck: supple Cardiovascular: RRR without murmur. No JVD    Respiratory/Chest: CTA Bilaterally without wheezes or rales. Normal effort    GI/Abdomen: BS +, non-tender, non-distended Ext: no clubbing, cyanosis, or edema Psych: pleasantly confused Musc: No edema in extremities.  No tenderness in extremities. Neuro: Alert, confused, follows basic commands, answers simple questions    Assessment/Plan: 1. Functional deficits which require 3+ hours per day of interdisciplinary therapy in a comprehensive inpatient rehab setting.  Physiatrist is providing close team supervision and 24 hour management of active medical problems listed below.  Physiatrist and rehab team continue to assess barriers to discharge/monitor patient progress toward functional and medical goals  Care Tool:  Bathing  Bathing activity did not occur: Safety/medical concerns Body parts bathed by patient: Face   Body parts bathed by helper: Right arm,Left arm,Chest,Abdomen,Front perineal area,Buttocks,Right upper leg,Left upper leg,Left lower leg,Right lower leg,Face Body parts n/a:  Left lower leg,Left upper leg,Right upper leg,Right lower leg,Buttocks,Abdomen,Front perineal area,Left arm,Right arm,Chest   Bathing assist Assist Level: Total Assistance - Patient < 25%     Upper Body Dressing/Undressing Upper body dressing Upper body dressing/undressing activity did not occur (including orthotics): Safety/medical concerns What is the patient wearing?: Pull over shirt    Upper body assist Assist Level: Total Assistance - Patient < 25%    Lower Body Dressing/Undressing Lower body dressing      What is the patient wearing?: Incontinence brief,Pants     Lower body assist Assist for lower body dressing: Dependent - Patient 0%     Toileting Toileting Toileting Activity did not occur (Clothing management and hygiene only): N/A (no void or bm)  Toileting assist Assist for toileting: Maximal Assistance - Patient 25 - 49% (bedlevel/brief change)     Transfers Chair/bed transfer  Transfers assist  Chair/bed transfer activity did not occur: Safety/medical concerns  Chair/bed transfer assist level: Moderate Assistance - Patient 50 - 74%     Locomotion Ambulation   Ambulation assist   Ambulation activity did not occur: Safety/medical concerns  Assist level: Moderate Assistance - Patient 50 - 74% Assistive device: Max distance: 40 ft   Walk 10 feet activity   Assist  Walk 10 feet activity did not occur: Safety/medical concerns  Assist  level: Moderate Assistance - Patient - 50 - 74% Assistive device: Walker-Eva   Walk 50 feet activity   Assist Walk 50 feet with 2 turns activity did not occur: Safety/medical concerns  Assist level: 2 helpers Assistive device: Walker-Eva    Walk 150 feet activity   Assist Walk 150 feet activity did not occur: Safety/medical concerns         Walk 10 feet on uneven surface  activity   Assist Walk 10 feet on uneven surfaces activity did not occur: Safety/medical concerns          Wheelchair     Assist Will patient use wheelchair at discharge?: Yes (pt may benefit from [facility] assessment however unsafe to attempt this AM; per PT long term goals)   Wheelchair activity did not occur: Safety/medical concerns         Wheelchair 50 feet with 2 turns activity    Assist    Wheelchair 50 feet with 2 turns activity did not occur: Safety/medical concerns       Wheelchair 150 feet activity     Assist  Wheelchair 150 feet activity did not occur: Safety/medical concerns       Blood pressure 107/87, pulse 73, temperature 98 F (36.7 C), resp. rate 16, height 5\' 7"  (1.702 m), weight 57 kg, SpO2 100 %.  Medical Problem List and Plan: 1.  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             - SNF placement still pending.   - Advanced WM changes on MRI 4/12 likely related to SVD. Pt + for cocaine on admit.    -Continue CIR therapies including PT, OT    2.  Impaired mobility -DVT/anticoagulation: Continue Lovenox             -antiplatelet therapy: Aspirin 325 mg 3. Pain Management: Continue Tylenol as needed 4. Agitation/psychosis: Antipsychotic: Continue zyprexa 5mg  hs and 2.5mg  daily  - Continue bilateral mittens as needed  -depakote 500mg  tid  -melatonin 3mg  HS    -limit prn haldol    -continue same doses of zyprexa as this seems to have achieved the best balance for her 5. Neuropsych: This patient is capable of making decisions on her own behalf. 6. Skin/Wound Care: Routine skin checks 7. Fluids/Electrolytes/Nutrition:    I personally reviewed the patient's labs today.    -push PO fluids, BUN sl elevated---recheck BMET today5/27 Filed Weights   10/14/20 0425 10/15/20 0359 10/16/20 0500  Weight: 57.9 kg 58.2 kg 57 kg    Stable 5/31 8.  Seizure disorder.  Vimpat 200 mg twice daily, valproic acid 1000 mg twice daily.  EEG negative  No breakthrough  seizures on 5/30 9.  Hypertension.  Norvasc 10 mg daily     Continue lopressor 25mg  bid Vitals:   10/15/20 1951 10/16/20 0423  BP: (!) 157/99 107/87  Pulse: 74 73  Resp: 14 16  Temp: 98.2 F (36.8 C) 98 F (36.7 C)  SpO2: 91% 100%  bordereline controlled 5/31 10.  History of bilateral cerebral convexity infarction frontal region.  CIR 08/19/2019-08/27/2019 11.  Hyperlipidemia.  Continue Lipitor 12.  History of polysubstance abuse.  Urine drug screen positive cocaine.   13. AKI:   Creatinine 1.21 on 5/29, BUN still elevated  14. UTI: UCX never performed.     -f/u urine cx negative 15. Dysphagia:     -D1, thins  Intake is more consistent  LOS: 46 days A FACE TO FACE EVALUATION WAS PERFORMED  Ranelle Oyster 10/16/2020, 12:06 PM

## 2020-10-16 NOTE — Progress Notes (Signed)
Occupational Therapy Session Note  Patient Details  Name: Cindy [name] MRN: [id number] Date of Birth: [birth year]  Today's Date: 10/16/2020 OT Individual Time: 1303-1400 OT Individual Time Calculation (min): 57 min    Short Term Goals: Week 6:  OT Short Term Goal 1 (Week 6): STG = LTG d/t ELOS  Skilled Therapeutic Interventions/Progress Updates:    Pt received supine sleeping soundly. Tactile and verbal cues provided and pt suddenly shot straight up in bed. Pt nodding head yes to taking shower. She required mod A to transfer to EOB. Max A stand pivot to transfer to TIS shower chair. She required max A to remove all clothing for shower. In the shower pt began yelling "I have to pee". Encouraged pt that it was ok to void in the shower and that she was on a shower chair with a cut out. Pt required max A for hair care and UB bathing. She was incredibly distracted by having to pee, despite having voided already. She then stated "poop" and began having bowel movement, yelling "it's getting all over you" and near hysterics as well as very restless. Pt was quickly removed from shower, dryed off and transferred to [facility]. She continued voiding BM but had great difficulty settling down, still very restless. She required total A +2 for standing level peri hygiene. She was transferred to [facility] w/c where she settled down a bit more. She completed hair brushing and hand washing at the sink with min A. Shirt donned with max A. She was transferred back to bed with posey belt set. Pt requesting to watch TV on the "oink" channel. Instead turned on calming music with relaxing visual aid. Pt was left supine with all needs met, bed alarm set.   Therapy Documentation Precautions:  Precautions Precautions: Fall Precaution Comments: bilateral mitts and bedrails padded Restrictions Weight Bearing Restrictions: No   Therapy/Group: Individual Therapy  Curtis Sites 10/16/2020, 6:25 AM

## 2020-10-16 NOTE — Patient Care Conference (Signed)
Inpatient RehabilitationTeam Conference and Plan of Care Update Date: 10/16/2020   Time: 10:03 AM    Patient Name: Cindy [name]      Medical Record Number: [id number]  Date of Birth: [birth year] Sex: Female         Room/Bed: 4W08C/4W08C-01 Payor Info: Payor: MEDICAID [place] / Plan: MEDICAID OF [place] / Product Type: *No Product type* /    Admit Date/Time:  08/31/2020  5:12 PM  Primary Diagnosis:  Acute metabolic encephalopathy  Hospital Problems: Principal Problem:   Acute metabolic encephalopathy Active Problems:   Dysphagia   Agitation    Expected Discharge Date: Expected Discharge Date:  (SNF)  Team Members Present: Physician leading conference: Dr. Faith Rogue Care Coodinator Present: [name], LCSWA;Ayleen Mckinstry Marlyne Beards, RN, BSN, CRRN Nurse Present: Kennyth Arnold, RN PT Present: Serina Cowper, PT OT Present: Jake Shark, OT SLP Present: Feliberto Gottron, SLP PPS Coordinator present : Fae Pippin, SLP     Current Status/Progress Goal Weekly Team Focus  Bowel/Bladder   Incontinent of B/B  restore continence  Timed toileting Q2   Swallow/Nutrition/ Hydration   Dys. 1 textures with thin liquids, Mod A  Mod A  possible trials of Dys. 2 textures   ADL's   When she is alert, pt is much more automatic in her sequencing through ADLs, able to don shirt with min A yesterday, void on toilet,  max A LB  POC adjusted to d/c ADLs and reflect caregiver total A at SNF level  d/c planning, ADLs, transfers, cognition   Mobility   Min-mod A overall, can fluctuate to max A with fatigue or frustration, gait with [place] walker  mod A overall  Activity tolerance, functional mobility, stimulation tolerance, sitting tolerance, safety awareness, motor planning   Communication   Mod-Max A  Mod A  use of speech intelligibility strategies at the word level   Safety/Cognition/ Behavioral Observations  Max-Toal A  Max A  initiation, attention   Pain   no c/o pain  remain free of pain   Assess Qshift and PRN   Skin   Skin intact  skin remain intact and free from infection  Assess Qshift and PRN     Discharge Planning:  SNF pending approval from Corporate office due to insurance.   Team Discussion: No new medical updates. Incontinent B/B, can be continent at times on the [facility].  Patient on target to meet rehab goals: Has had a good couple of days, voided on the toilet. Put on shirt with min assist. Still max/total assist with ADL's. Mod assist with RW, up and down stairs with mod assist. Dys 1, thin liquids due to ill fitting dentures. Intelligible speech comes and goes.  *See Care Plan and progress notes for long and short-term goals.   Revisions to Treatment Plan:   None at this time. Teaching Needs: Family education, medication management, transfer training, gait training, balance training, endurance training, safety awareness.   Current Barriers to Discharge: Decreased caregiver support, Medical stability, Home enviroment access/layout, Incontinence, Lack of/limited family support, Insurance for SNF coverage, Medication compliance, Behavior and Nutritional means  Possible Resolutions to Barriers: Continue current medications, provide emotional support.     Medical Summary Current Status: some improvement in behavior/cognition but remains quite impaired. incontinent  Barriers to Discharge: Behavior   Possible Resolutions to [employer] Focus: continue cognitive-behavioral therapy   Continued Need for Acute Rehabilitation Level of Care: The patient requires daily medical management by a physician with specialized training in physical medicine and rehabilitation  for the following reasons: Direction of a multidisciplinary physical rehabilitation program to maximize functional independence : Yes Medical management of patient stability for increased activity during participation in an intensive rehabilitation regime.: Yes Analysis of laboratory values and/or  radiology reports with any subsequent need for medication adjustment and/or medical intervention. : Yes   I attest that I was present, lead the team conference, and concur with the assessment and plan of the team.   Tennis Must 10/16/2020, 2:20 PM

## 2020-10-16 NOTE — Progress Notes (Signed)
Physical Therapy Session Note  Patient Details  Name: Cindy [name] MRN: [id number] Date of Birth: Jan 18, 1961  Today's Date: 10/16/2020 PT Individual Time: 0800-0830 PT Individual Time Calculation (min): 30 min   Short Term Goals: Week 6:  PT Short Term Goal 1 (Week 6): STG=LTG, patient is pending SNF placemet.  Skilled Therapeutic Interventions/Progress Updates:     Patient in bed upon PT arrival. Patient alert and agreeable to PT session. Patient denied pain during session.   Therapeutic Activity: Bed Mobility: Patient performed supine to sit with min A for motor planning and limitations with balance due to truncal ataxia. Provided verbal cues for initiation and sequencing. Patient threaded lower extremities in sitting and pulled up pants in standing with max A-total A, placing her feet through the leg wholes with therapist holding the pants. Patient doffed/donned scrub top with mod-min A and cues for sequencing.  Transfers: Patient performed stand pivot bed>TIS w/c and mat table>TIS w/c with min-mod A with patient automatically reaching for arm rests to stabilize during transfers. She performed sit to/from stand x1 without AD, x1 with RW, and x1 using stair rail with min A. Provided verbal cues for hand placement, forward weight shift and safety awareness. Patient washed her face at the sink with min A for set-up and cues for sequencing. PT combed her hair with total A for time management.   Gait Training:  Patient ambulated 32 feet using RW with mod A. Ambulated with narrow BOS, significant forward flexed posture, downward head gaze, decreased B knee flexion throughout gait, and posterior bias. Provided verbal cues for erect posture, looking ahead, increased BOS, and increased hip extension. Patient ascended/descended 4-6" steps using B rails with mod A. Performed reciprocal gait pattern throughout. Provided cues for technique and sequencing.   Wheelchair Mobility:  Patient was  transported in the TIS w/c with total A throughout session for energy conservation and time management.  Patient in TIS w/c seated at the nurses station with nursing secretary at end of session with breaks locked, seat belt alarm set, and all needs within reach. Patient able to read 25-50% of a sign in front of her at end of session with mod cues for locating the word on the page and repeating the word when response was incorrect.   Therapy Documentation Precautions:  Precautions Precautions: Fall Precaution Comments: bilateral mitts and bedrails padded Restrictions Weight Bearing Restrictions: No   Therapy/Group: Individual Therapy  Cindy [name] PT, DPT  10/16/2020, 12:42 PM

## 2020-10-16 NOTE — Progress Notes (Signed)
Patient ID: Cindy [name], female   DOB: Jun 27, 1960, 60 y.o.   MRN: [id number]  SW [facility] [name][facility] [phone] confirms that pt husband [name] dropped off bank statements, and now waiting on updates from corporate.  [name], MSW, LCSWA Office: [phone] Cell: [phone] Fax: [phone]

## 2020-10-16 NOTE — Progress Notes (Signed)
Nutrition Follow-up  DOCUMENTATION CODES:   Non-severe (moderate) malnutrition in context of chronic illness  INTERVENTION:  ContinueEnsure Enlive poTID, each supplement provides 350 kcal and 20 grams of protein.  Encourage adequate PO intake.  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 Cranial CT/MRI scan was performed and was negative for acute changes but did show remote lacunar infarct. EEG suggestive of cortical dysfunction arising from left hemisphere likely 2/2 underlying structural abnormality post ictal state no seizure or definite epileptiform discharges. Admitted to CIR 4/15.  Pt continues on a dysphagia 1 diet with thin liquids. Meal completion has been 40-100% with most intake at 80-100%. Pt currently has Ensure ordered and has been consuming them. RD to continue with current orders to aid in caloric and protein needs. SNF placement pending.   Labs and medications reviewed.   Diet Order:   Diet Order            DIET - DYS 1 Room service appropriate? Yes; Fluid consistency: Thin  Diet effective now                 EDUCATION NEEDS:   Not appropriate for education at this time  Skin:  Skin Assessment: Reviewed RN Assessment  Last BM:  5/31  Height:   Ht Readings from Last 1 Encounters:   09/03/20 5\' 7"  (1.702 m)    Weight:   Wt Readings from Last 1 Encounters:  10/16/20 57 kg   BMI:  Body mass index is 19.68 kg/m.  Estimated Nutritional Needs:   Kcal:  1800-2000  Protein:  90-100 grams  Fluid:  >1.8L  10/18/20, MS, RD, LDN RD pager number/after hours weekend pager number on Amion.

## 2020-10-17 NOTE — Progress Notes (Signed)
Occupational Therapy Weekly Progress Note  Patient Details  Name: Cindy [name] MRN: [id number] Date of Birth: [birth year]  Beginning of progress report period: Oct 07, 2020 End of progress report period: October 17, 2020  Today's Date: 10/17/2020 OT Individual Time: 1135-1200 OT Individual Time Calculation (min): 25 min    Patient has met 2 of 5 long term goals.  Short term goals not set due to pending SNF d/c. Pt has met her goals of self feeding and grooming at max A level. Pt continues to fluctuate heavily in her performance of ADLs, mostly impacted by her arousal, alertness, attention to task, and motor planning, but has overall been improving functionally.   Patient continues to demonstrate the following deficits: muscle weakness, decreased midline orientation and decreased motor planning, decreased initiation, decreased attention, decreased awareness, decreased problem solving, decreased safety awareness, decreased memory and delayed processing and decreased sitting balance, decreased standing balance, decreased postural control and decreased balance strategies and therefore will continue to benefit from skilled OT intervention to enhance overall performance with BADL and Reduce care partner burden.  See Patient's Care Plan for progression toward long term goals.  Patient progressing toward long term goals..  Continue plan of care.  Skilled Therapeutic Interventions/Progress Updates:    Pt received supine, restless and pulling at bed rails. Pt was checked for incontinence and was clean. She donned pants with mod A at bed level, continues to improve in automatic motor planning. She completed bed mobility to EOB with mod A. She was then able to maintain sitting balance EOB with close supervision for several minutes. She transferred to the w/c with max A, stand pivot. She required tactile cues for initiation of face washing and then was able to carryover motor plan. She was then taken outside as pt  began to get restless again. Once outside pt became visibly relaxed and visually tracked things in the environment OT pointed out. Pt was returned inside and left sitting up at the nurses desk, chair alarm belt on.   Therapy Documentation Precautions:  Precautions Precautions: Fall Precaution Comments: bilateral mitts and bedrails padded Restrictions Weight Bearing Restrictions: No  Therapy/Group: Individual Therapy  Curtis Sites 10/17/2020, 6:31 AM

## 2020-10-17 NOTE — Progress Notes (Signed)
Speech Language Pathology Daily Session Note  Patient Details  Name: HADDY MULLINAX MRN: [id number] Date of Birth: 09-06-1960  Today's Date: 10/17/2020 SLP Individual Time: 0725-0805 SLP Individual Time Calculation (min): 40 min  Short Term Goals: Week 7: SLP Short Term Goal 1 (Week 7): STGs=LTGs due to ELOS  Skilled Therapeutic Interventions: Skilled treatment session focused on communication and dysphagia goals. Upon arrival, patient was asleep in bed but awakened with verbal and tactile cues. SLP facilitated session by initially providing hand over hand assist with self-feeding but due to deficits in coordination, SLP had feed the patient the remainder of the meal. Patient consumed Dys. 1 textures with thin liquids with minimal overt s/s of aspiration and demonstrated what appeared to be prolonged AP transit and multiple swallows. Recommend patient continue current diet. Patient verbalized at the phrase and sentence level and was ~75% intelligible. Patient with intermittent verbal perseveration on topics but was redirected.  Patient left upright in bed with alarm on and all needs within reach. Continue with current plan of care.      Pain No/Denies Pain   Therapy/Group: Individual Therapy  Simren Popson 10/17/2020, 4:26 PM

## 2020-10-17 NOTE — Progress Notes (Signed)
Physical Therapy Weekly Progress Note  Patient Details  Name: Cindy [name] MRN: [id number] Date of Birth: Apr 04, 1961  Beginning of progress report period: Oct 11, 2020 End of progress report period: October 17, 2020  Today's Date: 10/17/2020 PT Individual Time: 1400-1430 PT Individual Time Calculation (min): 30 min   Patient continues to make slow-steady variable progress, however, has improved to mod-min A overall with mobility more consistently with improved arousal and awareness during sessions.  Patient continues to await SNF placement due to decreased caregiver support for current level of assist at home. Patient performs bed mobility with min A >75% of the time, transfers with mod A consistently, gait with mod A with RW >30 feet x1 this week, patient also initiated stair training with mod A on 4 steps using B rails.    Patient continues to demonstrate the following deficits muscle weakness, decreased cardiorespiratoy endurance, impaired timing and sequencing, ataxia, decreased coordination and decreased motor planning, decreased initiation, decreased attention, decreased awareness, decreased problem solving, decreased safety awareness, decreased memory and delayed processing and decreased sitting balance, decreased standing balance, decreased postural control, decreased balance strategies and difficulty maintaining precautions and therefore will continue to benefit from skilled PT intervention to increase functional independence with mobility.  Patient progressing toward long term goals.  Continue plan of care.  PT Short Term Goals Week 6:  PT Short Term Goal 1 (Week 6): STG=LTG, patient is pending SNF placemet. Week 7:     Skilled Therapeutic Interventions/Progress Updates:     Patient in TIS w/c in the room with rehab tech present for supervision, reports NT was providing supervision upon their arrival. Patient noted to be up several hours at the nurses station today. Patient alert and  agreeable to PT session. Patient denied pain during session. Patient verbal and understandable >50% of session, able to speak clearer with cues to repeat it louder. Followed 1-step commands throughout session.   Therapeutic Activity: Bed Mobility: Patient performed sit to supien with min A to initiate due to fatigue and total of 2 to scoot up in the bed. Transfers: Patient performed sit to/from stand x1 with min/mod A +2 for B HHA and stand pivot TIS w/c>bed with mod A of 1 person and second SBA for safety due to fatigue. Provided verbal cues for initiation, forward weight shift, and hip extension (patient with increased hip/trunk flexion with stand pivot this session.  Gait Training:  Patient ambulated 24 feet using B HHA with mod A +2 outside on unlevel sidewalk, required max A of 1 person to stand for w/c to be brought up behind her at end of gait trial due to fatigue. Ambulated with narrow BOS, increased hip and trunk flexion, increased knee hyperextension in stance, noted with behaviors consistent with fear of falling, able to continue with encouragement and with PT supporting her from behind. Provided verbal cues and facilitation for hip/trunk extension and forward momentum.  Wheelchair Mobility:  Patient transported in TIS w/c with total A to/from [facility] entrance for outdoor session, LPN made aware. Patient reported that she loves the sun and repeated this several times when first outside with a smile on her face. Patient able to tell therapist that she sits outside on the porch all the time at home and that she "loves the summer." Patient unable to provide any other activities to do outside when asked.   Patient with SOB and sweating due to the heat outside following gait training. Returned inside and provided a cold compress and  ice water with symptoms resolved. Vitals: BP 124/88, HR 71, SPO2 94%.  Patient was eager to drink the ice water and took a lot in quickly, PT removed the straw from her  mouth to stop intake for safety. Patient coughed several times after, but able to follow cues for throat clearing, coughing, and PT provided suction after with total A. Patient recovered well and stated that she was okay. When offered more water she said, "hell no!" patient agreeable to returning to bed, see mobility above, and was restless once in lying. PT turned on meditation music for patient and patient resting calmly after.   Patient in bed at end of session with breaks locked, bed and Posey belt alarm set, and all needs within reach.    Therapy Documentation Precautions:  Precautions Precautions: Fall Precaution Comments: bilateral mitts and bedrails padded Restrictions Weight Bearing Restrictions: No   Therapy/Group: Individual Therapy  Tenasia Aull L Suhaila Troiano PT, DPT  10/17/2020, 5:32 PM

## 2020-10-18 NOTE — Progress Notes (Signed)
Speech Language Pathology Daily Session Note  Patient Details  Name: Cindy [name] MRN: [id number] Date of Birth: January 22, 1961  Today's Date: 10/18/2020 SLP Individual Time: 1100-1125 SLP Individual Time Calculation (min): 25 min  Short Term Goals: Week 7: SLP Short Term Goal 1 (Week 7): STGs=LTGs due to ELOS  Skilled Therapeutic Interventions: Skilled treatment session focused on cognitive goals. Upon arrival, patient was awake while upright in the wheelchair. A family member entered the room and patient was able to recall his name appropriately. Patient participated in a basic conversation with only mild confusion noted. Patient was ~75% intelligible throughout. SLP also facilitated session with a matching task from a field of 4 and 6. Patient required extra time and overall Mod A verbal cues for problem solving. Of note, patient initially tried to place a piece of the matching task in her mouth unsuccessfully. Patient left upright in wheelchair with alarm on and all needs within reach. Continue with current plan of care.      Pain No/Denies Pain   Therapy/Group: Individual Therapy  Stasha Naraine 10/18/2020, 11:50 AM

## 2020-10-18 NOTE — Progress Notes (Signed)
Physical Therapy Session Note  Patient Details  Name: Cindy [name] MRN: [id number] Date of Birth: Sep 27, 1960  Today's Date: 10/18/2020 PT Individual Time: 0830-0900 PT Individual Time Calculation (min): 30 min   Short Term Goals: Week 1:  PT Short Term Goal 1 (Week 1): pt to demonstrated supine<>sit max A x1 PT Short Term Goal 1 - Progress (Week 1): Met PT Short Term Goal 2 (Week 1): pt to demonstrate sitting balance 5 min at mod A x1 PT Short Term Goal 2 - Progress (Week 1): Met PT Short Term Goal 3 (Week 1): pt to demonstrate bed<>chair transfers max A x1 PT Short Term Goal 3 - Progress (Week 1): Partly met (inconsistent) PT Short Term Goal 4 (Week 1): pt to tolerate sitting OOB 1 hour for improved participation with therapy PT Short Term Goal 4 - Progress (Week 1): Progressing toward goal PT Short Term Goal 5 (Week 1): Initiate gait training PT Short Term Goal 5 - Progress (Week 1): Met Week 2:  PT Short Term Goal 1 (Week 2): Patient will perform bed mobility with mod A >50% of the time. PT Short Term Goal 1 - Progress (Week 2): Progressing toward goal PT Short Term Goal 2 (Week 2): Patient will perform bed>chair transfers with max A consitently. PT Short Term Goal 2 - Progress (Week 2): Progressing toward goal PT Short Term Goal 3 (Week 2): Patient will perform standing balance >1 min with mod-max A. PT Short Term Goal 3 - Progress (Week 2): Met PT Short Term Goal 4 (Week 2): Patient will ambulate >25 feet with +2 assist. PT Short Term Goal 4 - Progress (Week 2): Met Week 3:  PT Short Term Goal 1 (Week 3): STG=LTG due to ELOS. PT Short Term Goal 1 - Progress (Week 3): Progressing toward goal Week 4:  PT Short Term Goal 1 (Week 4): STG=LTG, patient is pending SNF placemet. PT Short Term Goal 1 - Progress (Week 4): Progressing toward goal Week 5:  PT Short Term Goal 1 (Week 5): STG=LTG, patient is pending SNF placemet. PT Short Term Goal 1 - Progress (Week 5): Progressing toward  goal  Skilled Therapeutic Interventions/Progress Updates:    Pain:  Pt reports no pain.  Treatment to tolerance.  Rest breaks and repositioning as needed.  Pt initially supine and agreeable to treatment session w/focus on functional strengthening.  Pt dons pants in supine w/mod assist due to ataxia, difficulty threading feet, therapist stabilizes feet for pt to bridge, additional time to complete raising pants over hips. Supine to sit w/cues for sequencing/safety.  Severe total body ataxia w/task.  stand pivot transfer mod assist to wc, cues for safety and sequencing.   From wc level, pt able to wash face and place dentures w/min assist, set up, and additional time. Pt transported to gym. Sit to stand at hi/lo table w/mod assist.  Worked on upright posture via visual scanning for objects outside window, hands on high table to promote extension, multimodal cues for upright.  Stands w/therapist x 6 min for focus on upright control. At end of session, Pt left oob in wc w/alarm belt set and needs in reach   Therapy Documentation Precautions:  Precautions Precautions: Fall Precaution Comments: bilateral mitts and bedrails padded Restrictions Weight Bearing Restrictions: No   Therapy/Group: Individual Therapy  Callie Fielding, [place] 10/18/2020, 12:46 PM

## 2020-10-18 NOTE — Plan of Care (Signed)
  Problem: RH BOWEL ELIMINATION Goal: RH STG MANAGE BOWEL WITH ASSISTANCE Description: STG Manage Bowel with min Assistance. Outcome: Not Progressing; incontinence   Problem: RH BLADDER ELIMINATION Goal: RH STG MANAGE BLADDER WITH ASSISTANCE Description: STG Manage Bladder With min Assistance Outcome: Not Progressing; incontinence   

## 2020-10-18 NOTE — Progress Notes (Signed)
Patient orientation remains to person, continue to rest at interval with periods of agitation and verbal outburst episodes,which are uncontrollable and speech remains incomprehensible, noted at periods and  interval responses are appropriate. Remains on Vimpat po 200 mg, no apparent seizure activities, bed railing remains padded and suction set up at bedside.     0600 Patient is in good spirit this morning and cooperative, denies pain or discomfort. Patient asked writer for bed pan and voided with no coaching needed. Assisted with repositioning in bed, monitored

## 2020-10-18 NOTE — Progress Notes (Signed)
Occupational Therapy Session Note  Patient Details  Name: Cindy [name] MRN: [id number] Date of Birth: 1960/07/27  Today's Date: 10/18/2020 OT Missed Time: 45 Minutes Missed Time Reason: Patient fatigue  Pt received in bed and unarousable despite multimodal cuing. Pt missed 45 of skilled OT d/t fatigue. Will follow up per POC [facility] 10/18/2020, 6:51 AM

## 2020-10-19 NOTE — Progress Notes (Signed)
PROGRESS NOTE   Subjective/Complaints:  Pt resting well. No new complaints. Denied pain  ROS: Limited due to cognitive/behavioral    Objective:   No results found. No results for input(s): WBC, HGB, HCT, PLT in the last 72 hours. No results for input(s): NA, K, CL, CO2, GLUCOSE, BUN, CREATININE, CALCIUM in the last 72 hours.  Intake/Output Summary (Last 24 hours) at 10/19/2020 1107 Last data filed at 10/19/2020 0820 Gross per 24 hour  Intake 500 ml  Output --  Net 500 ml        Physical Exam: Vital Signs Blood pressure (!) 142/97, pulse 69, temperature 97.6 F (36.4 C), temperature source Oral, resp. rate 16, height 5\' 7"  (1.702 m), weight 58.4 kg, SpO2 100 %.   Constitutional: No distress . Vital signs reviewed. HEENT: EOMI, oral membranes moist Neck: supple Cardiovascular: RRR without murmur. No JVD    Respiratory/Chest: CTA Bilaterally without wheezes or rales. Normal effort    GI/Abdomen: BS +, non-tender, non-distended Ext: no clubbing, cyanosis, or edema Psych: pleasantly confused. Musc: No edema in extremities.  No tenderness in extremities. Neuro: Alert, confused, follows basic commands, answers simple questions    Assessment/Plan: 1. Functional deficits which require 3+ hours per day of interdisciplinary therapy in a comprehensive inpatient rehab setting.  Physiatrist is providing close team supervision and 24 hour management of active medical problems listed below.  Physiatrist and rehab team continue to assess barriers to discharge/monitor patient progress toward functional and medical goals  Care Tool:  Bathing  Bathing activity did not occur: Safety/medical concerns Body parts bathed by patient: Face   Body parts bathed by helper: Right arm,Left arm,Chest,Abdomen,Front perineal area,Buttocks,Right upper leg,Left upper leg,Left lower leg,Right lower leg,Face Body parts n/a: Left lower leg,Left  upper leg,Right upper leg,Right lower leg,Buttocks,Abdomen,Front perineal area,Left arm,Right arm,Chest   Bathing assist Assist Level: Total Assistance - Patient < 25%     Upper Body Dressing/Undressing Upper body dressing Upper body dressing/undressing activity did not occur (including orthotics): Safety/medical concerns What is the patient wearing?: Pull over shirt    Upper body assist Assist Level: Total Assistance - Patient < 25%    Lower Body Dressing/Undressing Lower body dressing      What is the patient wearing?: Incontinence brief,Pants     Lower body assist Assist for lower body dressing: Dependent - Patient 0%     Toileting Toileting Toileting Activity did not occur (Clothing management and hygiene only): N/A (no void or bm)  Toileting assist Assist for toileting: 2 Helpers     Transfers Chair/bed transfer  Transfers assist  Chair/bed transfer activity did not occur: Safety/medical concerns  Chair/bed transfer assist level: Moderate Assistance - Patient 50 - 74%     Locomotion Ambulation   Ambulation assist   Ambulation activity did not occur: Safety/medical concerns  Assist level: Moderate Assistance - Patient 50 - 74% Assistive device: Walker-rolling Max distance: 32 ft   Walk 10 feet activity   Assist  Walk 10 feet activity did not occur: Safety/medical concerns  Assist level: Moderate Assistance - Patient - 50 - 74% Assistive device: Walker-rolling   Walk 50 feet activity   Assist Walk 50 feet  with 2 turns activity did not occur: Safety/medical concerns  Assist level: 2 helpers Assistive device: Walker-Eva    Walk 150 feet activity   Assist Walk 150 feet activity did not occur: Safety/medical concerns         Walk 10 feet on uneven surface  activity   Assist Walk 10 feet on uneven surfaces activity did not occur: Safety/medical concerns         Wheelchair     Assist Will patient use wheelchair at discharge?: Yes  (pt may benefit from [facility] assessment however unsafe to attempt this AM; per PT long term goals)   Wheelchair activity did not occur: Safety/medical concerns         Wheelchair 50 feet with 2 turns activity    Assist    Wheelchair 50 feet with 2 turns activity did not occur: Safety/medical concerns       Wheelchair 150 feet activity     Assist  Wheelchair 150 feet activity did not occur: Safety/medical concerns       Blood pressure (!) 142/97, pulse 69, temperature 97.6 F (36.4 C), temperature source Oral, resp. rate 16, height 5\' 7"  (1.702 m), weight 58.4 kg, SpO2 100 %.  Medical Problem List and Plan: 1.  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             - SNF placement still pending.   - Advanced WM changes on MRI 4/12 likely related to SVD. Pt + for cocaine on admit.    QD therapies   2.  Impaired mobility -DVT/anticoagulation: Continue Lovenox             -antiplatelet therapy: Aspirin 325 mg 3. Pain Management: Continue Tylenol as needed 4. Agitation/psychosis: Antipsychotic: Continue zyprexa 5mg  hs and 2.5mg  daily  - Continue bilateral mittens as needed  -depakote 500mg  tid  -melatonin 3mg  HS    -limit prn haldol    -continue same doses of zyprexa as this seems to have achieved the best balance for her 5. Neuropsych: This patient is capable of making decisions on her own behalf. 6. Skin/Wound Care: Routine skin checks 7. Fluids/Electrolytes/Nutrition:    I personally reviewed the patient's labs today.    -push PO fluids, BUN sl elevated---recheck BMET today5/27 Filed Weights   10/17/20 0438 10/18/20 0346 10/19/20 0438  Weight: 58.3 kg 58 kg 58.4 kg    Stable 6/2 8.  Seizure disorder.  Vimpat 200 mg twice daily, valproic acid 1000 mg twice daily.  EEG negative  No breakthrough seizures on 5/30 9.  Hypertension.  Norvasc 10 mg daily     Continue  lopressor 25mg  bid Vitals:   10/18/20 1948 10/19/20 0438  BP: 117/70 (!) 142/97  Pulse: 85 69  Resp: 18 16  Temp: 99.7 F (37.6 C) 97.6 F (36.4 C)  SpO2: (!) 81% 100%  bordereline controlled 6/2 10.  History of bilateral cerebral convexity infarction frontal region.  CIR 08/19/2019-08/27/2019 11.  Hyperlipidemia.  Continue Lipitor 12.  History of polysubstance abuse.  Urine drug screen positive cocaine.   13. AKI:   Creatinine 1.21 on 5/29, BUN still elevated---recheck labs monday  14. UTI: UCX never performed.     -f/u urine cx negative 15. Dysphagia:     -D1, thins  Intake has been more consistent  LOS: 49 days A FACE TO FACE EVALUATION WAS PERFORMED  12/19/20 10/19/2020, 11:07 AM

## 2020-10-19 NOTE — Plan of Care (Signed)
  Problem: Consults Goal: RH STROKE PATIENT EDUCATION Description: See Patient Education module for education specifics  Outcome: Progressing   Problem: RH BOWEL ELIMINATION Goal: RH STG MANAGE BOWEL WITH ASSISTANCE Description: STG Manage Bowel with min Assistance. Outcome: Progressing Goal: RH STG MANAGE BOWEL W/MEDICATION W/ASSISTANCE Description: STG Manage Bowel with Medication with min Assistance. Outcome: Progressing   Problem: RH BLADDER ELIMINATION Goal: RH STG MANAGE BLADDER WITH ASSISTANCE Description: STG Manage Bladder With min Assistance Outcome: Progressing   Problem: RH SKIN INTEGRITY Goal: RH STG MAINTAIN SKIN INTEGRITY WITH ASSISTANCE Description: STG Maintain Skin Integrity With min Assistance. Outcome: Progressing Goal: RH STG ABLE TO PERFORM INCISION/WOUND CARE W/ASSISTANCE Description: STG Able To Perform Incision/Wound Care With min Assistance. Outcome: Progressing   Problem: RH SAFETY Goal: RH STG ADHERE TO SAFETY PRECAUTIONS W/ASSISTANCE/DEVICE Description: STG Adhere to Safety Precautions With min Assistance/Device. Outcome: Progressing Goal: RH STG DECREASED RISK OF FALL WITH ASSISTANCE Description: STG Decreased Risk of Fall With min Assistance. Outcome: Progressing   Problem: RH PAIN MANAGEMENT Goal: RH STG PAIN MANAGED AT OR BELOW PT'S PAIN GOAL Description: <3 on a 0-10 pain scale. Outcome: Progressing   

## 2020-10-19 NOTE — Progress Notes (Signed)
Physical Therapy Session Note  Patient Details  Name: Cindy [name] MRN: [id number] Date of Birth: [birth year]  Today's Date: 10/19/2020 PT Individual Time: 1411-1511 PT Individual Time Calculation (min): 60 min   Short Term Goals: Week 5:  PT Short Term Goal 1 (Week 5): STG=LTG, patient is pending SNF placem[place]. PT Short Term Goal 1 - Progress (Week 5): Progressing toward goal  Skilled Therapeutic Interventions/Progress Updates:  Pt presents sitting in TIS, reclined at nursing station.  Pt participates in therapy session.  Pt wheeled to Dayroom for time conservation.  Pt performed multiple sit to stands during therapy session w/ min A and increased time.  Pt stood at hi-lo table at table-top height, but unable to extend for looking out window.  Pt stood w/ table raised and improved visual scanning.  Pt amb w/ HHA of 2 x 10-20' w/ verbal cues for posture.  Pt performed seated reaching activities overhead to improve upright posture.  Pt reaching across midline.  Pt returned to room and performed SPT w/c > bed w/ min A, verbal cues for sit to supine w/ min A for LEs.  Hip belt alarm on and all needs in reach.  TV turned on to pts approval.     Therapy Documentation Precautions:  Precautions Precautions: Fall Precaution Comments: bilateral mitts and bedrails padded Restrictions Weight Bearing Restrictions: No General:   Vital Signs: Therapy Vitals Pulse Rate: 76 Resp: 18 BP: 106/72 Patient Position (if appropriate): Sitting Oxygen Therapy SpO2: 98 % O2 Device: Room Air Pain: pt does not c/o pain.     Therapy/Group: Individual Therapy  Lucio Edward 10/19/2020, 3:27 PM

## 2020-10-19 NOTE — Progress Notes (Signed)
No acute distress during the shift, able to make his needs know to staff

## 2020-10-19 NOTE — Progress Notes (Signed)
Occupational Therapy Session Note  Patient Details  Name: Cindy [name] MRN: [id number] Date of Birth: [birth year]  Today's Date: 10/19/2020 OT Individual Time: 1027-1100 OT Individual Time Calculation (min): 33 min   Skilled Therapeutic Interventions/Progress Updates:    Pt greeted in bed, asleep, easily woken. When she was awakened, pt became increasingly restless, tossing her Lt leg off of the lower bedrail and trying to transfer out of bed. When the bedrail was lowered, pt appearing anxious, attempting to stand but unable to, exhibiting 4 limb ataxia. OT presented her with RW but pt unable to advance either LE safely for stand pivot<BSC. Pt unable to follow instruction for squat pivot due to anxiousness, ultimately completed stand pivot<BSC with Max A, 2nd helper assisting with lowering soiled brief. Pt sat for a few minutes to continue voiding, able to be deescalated once she was sitting, laughing with therapist and repeating unintelligible speech. Max A of 1 for standing balance with pt holding onto OT's shoulders to promote upright posture. She tended reach for the sink for stability. While pt stood with OT, RN assisted with perihygiene and changing brief. +2 for safety while pt completed stand pivot<bed afterwards. Pt took her medicine from RN while sitting EOB with CGA for sitting balance. Pt returned to bed where safety belt was applied, note that the safety belt mechanism was defected. RN aware that pt needs a new lap belt. Pt remained in bed with all needs within reach, bed alarm set, 4 bedrails up, lap belt secured, and fall mat in place.   Therapy Documentation Precautions:  Precautions Precautions: Fall Precaution Comments: bilateral mitts and bedrails padded Restrictions Weight Bearing Restrictions: No Pain: no s/s pain during tx   ADL: ADL Eating: Unable to assess Grooming: Unable to assess Upper Body Bathing: Unable to assess Lower Body Bathing: Unable to assess Upper Body  Dressing: Unable to assess Lower Body Dressing: Dependent Where Assessed-Lower Body Dressing: Bed level Toileting: Unable to assess Toilet Transfer: Unable to assess Tub/Shower Transfer: Unable to assess Psychologist, counselling Transfer: Unable to assess      Therapy/Group: Individual Therapy  Sewell Pitner A Jaedin Trumbo 10/19/2020, 12:22 PM

## 2020-10-19 NOTE — Progress Notes (Signed)
Occupational Therapy Session Note  Patient Details  Name: Cindy [name] MRN: [id number] Date of Birth: 02/22/1961  Today's Date: 10/19/2020 OT Individual Time: 1305-1330 OT Individual Time Calculation (min): 25 min    Short Term Goals: Week 1:  OT Short Term Goal 1 (Week 1): Pt will demonstrate improved attention to grooming task for >1 min with mod A. OT Short Term Goal 1 - Progress (Week 1): Not met OT Short Term Goal 2 (Week 1): Pt will tolerate sitting EOB with mod A for >2 min. OT Short Term Goal 2 - Progress (Week 1): Met OT Short Term Goal 3 (Week 1): Pt will don shirt max A. OT Short Term Goal 3 - Progress (Week 1): Not met OT Short Term Goal 4 (Week 1): Pt will self-feed 25% of meal with mod A. OT Short Term Goal 4 - Progress (Week 1): Not met  Skilled Therapeutic Interventions/Progress Updates:    1:1. Pt received in bed awake after toileting. Pt completes sup>sit with MAX A and requires MAX A to thread BLE EOB and +2 A to stand and advance pants past hips. Pt more conversational today, however audibly wheezing. Pt stand pivot transfer tow/c with MAX A of 1. Pt combs hair at sink and dons shirt with MIN A and increased time. Pt demo increased ataxia and voices decreased sensation in BUE. Pt RN alerted to O2 sat being 91-2% and passed info to next tx therapist. Exited session with pt seated inTIS in RN station, exit alarm on and call light in reach   Therapy Documentation Precautions:  Precautions Precautions: Fall Precaution Comments: bilateral mitts and bedrails padded Restrictions Weight Bearing Restrictions: No General:   Vital Signs: Therapy Vitals Temp: 97.6 F (36.4 C) Temp Source: Oral Pulse Rate: 69 Resp: 16 BP: (!) 142/97 Patient Position (if appropriate): Lying Oxygen Therapy SpO2: 100 % O2 Device: Room Air Pain: Pain Assessment Pain Score: 0-No pain ADL: ADL Eating: Unable to assess Grooming: Unable to assess Upper Body Bathing: Unable to  assess Lower Body Bathing: Unable to assess Upper Body Dressing: Unable to assess Lower Body Dressing: Dependent Where Assessed-Lower Body Dressing: Bed level Toileting: Unable to assess Toilet Transfer: Unable to assess Tub/Shower Transfer: Unable to assess Gaffer Transfer: Unable to assess Vision   Perception    Praxis   Exercises:   Other Treatments:     Therapy/Group: Individual Therapy  Tonny Branch 10/19/2020, 6:53 AM

## 2020-10-19 NOTE — Progress Notes (Signed)
Patient ID: Cindy [name], female   DOB: 1961/02/02, 60 y.o.   MRN: [id number]  SWleft [facility] for [name][phone] follow up on status.SW waiting on follow-up.   [name], MSW, LCSWA Office: [phone] Cell: [phone] Fax: [phone]

## 2020-10-19 NOTE — Progress Notes (Signed)
Patient is agitated and yelling out, stating she didn't do anything to go to jail and she is not going", refusing medication at this time, several attempts to claim patient but continues to scream out. Noted patient TV is on, immediately turned off TV and monitored, turned on music, NT stayed at patient bedside for support  8:25pm Patient more relaxed and receptive to conversations and taking her medication, encourage and monitor, taken po"s. Incont. care provided  0430 Sleep at interval,continue  regime

## 2020-10-19 NOTE — Progress Notes (Signed)
Speech Language Pathology Weekly Progress Note  Patient Details  Name: Cindy [name] MRN: [id number] Date of Birth: [birth year]  Beginning of progress report period: Oct 13, 2020 End of progress report period: October 19, 2020  Short Term Goals: Week 7: SLP Short Term Goal 1 (Week 7): STGs=LTGs due to ELOS Week 7: SLP Short Term Goal 1 (week 7): Not Met    New Short Term Goals: Week 8: STGs=LTGs due to ELOS  Weekly Progress Updates: Patient continues to make gains in participation, attention, ability to follow commands and overall speech intelligibility. However, patient continues to require Max-Total A to complete functional and familiar tasks safely in regards to problem solving, recall, awareness and overall safety. Patient remains on Dys. 1 textures with thin liquids with prolonged AP transit and intermittent overt s/s of aspiration noted with thin liquids with increased bolus size and impulsivity. Patient's family is unable to provide the necessary level of assistance needed at this time, therefore, patient is awaiting discharge to a SNF. Patient would benefit from continued skilled SLP intervention to maximize her cognitive and speech function prior to discharge.      Intensity: Minumum of 1-2 x/day, 30 to 90 minutes Frequency: 3 to 5 out of 7 days Duration/Length of Stay: waiting SNF placement Treatment/Interventions: Cognitive remediation/compensation;Speech/Language facilitation;Therapeutic Activities;Therapeutic Exercise;Dysphagia/aspiration precaution training;Patient/family education;Environmental controls;Cueing hierarchy;Functional tasks     [name], [place] 10/19/2020, 6:44 AM

## 2020-10-20 NOTE — Plan of Care (Signed)
  Problem: Consults Goal: RH STROKE PATIENT EDUCATION Description: See Patient Education module for education specifics  Outcome: Progressing   Problem: RH BOWEL ELIMINATION Goal: RH STG MANAGE BOWEL WITH ASSISTANCE Description: STG Manage Bowel with min Assistance. Outcome: Progressing Goal: RH STG MANAGE BOWEL W/MEDICATION W/ASSISTANCE Description: STG Manage Bowel with Medication with min Assistance. Outcome: Progressing   Problem: RH BLADDER ELIMINATION Goal: RH STG MANAGE BLADDER WITH ASSISTANCE Description: STG Manage Bladder With min Assistance Outcome: Progressing   Problem: RH SKIN INTEGRITY Goal: RH STG MAINTAIN SKIN INTEGRITY WITH ASSISTANCE Description: STG Maintain Skin Integrity With min Assistance. Outcome: Progressing Goal: RH STG ABLE TO PERFORM INCISION/WOUND CARE W/ASSISTANCE Description: STG Able To Perform Incision/Wound Care With min Assistance. Outcome: Progressing   Problem: RH SAFETY Goal: RH STG ADHERE TO SAFETY PRECAUTIONS W/ASSISTANCE/DEVICE Description: STG Adhere to Safety Precautions With min Assistance/Device. Outcome: Progressing Goal: RH STG DECREASED RISK OF FALL WITH ASSISTANCE Description: STG Decreased Risk of Fall With min Assistance. Outcome: Progressing   Problem: RH PAIN MANAGEMENT Goal: RH STG PAIN MANAGED AT OR BELOW PT'S PAIN GOAL Description: <3 on a 0-10 pain scale. Outcome: Progressing   

## 2020-10-20 NOTE — Progress Notes (Signed)
PROGRESS NOTE   Subjective/Complaints:  Feeling well Cindy [name] Patient's chart reviewed- No issues reported overnight Vitals signs stable  ROS: Limited due to cognitive/behavioral    Objective:   No results found. No results for input(s): WBC, HGB, HCT, PLT in the last 72 hours. No results for input(s): NA, K, CL, CO2, GLUCOSE, BUN, CREATININE, CALCIUM in the last 72 hours.  Intake/Output Summary (Last 24 hours) at 10/20/2020 1726 Last data filed at 10/20/2020 1229 Gross per 24 hour  Intake 520 ml  Output --  Net 520 ml        Physical Exam: Vital Signs Blood pressure 130/70, pulse 71, temperature 98.4 F (36.9 C), temperature source Oral, resp. rate 17, height 5\' 7"  (1.702 m), weight 58.2 kg, SpO2 100 %.  Gen: no distress, normal appearing HEENT: oral mucosa pink and moist, NCAT Cardio: Reg rate Chest: normal effort, normal rate of breathing Abd: soft, non-distended Ext: no edema Psych: pleasantly confused. Musc: No edema in extremities.  No tenderness in extremities. Neuro: Alert, confused, follows basic commands, answers simple questions    Assessment/Plan: 1. Functional deficits which require 3+ hours per day of interdisciplinary therapy in a comprehensive inpatient rehab setting.  Physiatrist is providing close team supervision and 24 hour management of active medical problems listed below.  Physiatrist and rehab team continue to assess barriers to discharge/monitor patient progress toward functional and medical goals  Care Tool:  Bathing  Bathing activity did not occur: Safety/medical concerns Body parts bathed by patient: Face   Body parts bathed by helper: Right arm,Left arm,Chest,Abdomen,Front perineal area,Buttocks,Right upper leg,Left upper leg,Left lower leg,Right lower leg,Face Body parts n/a: Left lower leg,Left upper leg,Right upper leg,Right lower leg,Buttocks,Abdomen,Front perineal  area,Left arm,Right arm,Chest   Bathing assist Assist Level: Total Assistance - Patient < 25%     Upper Body Dressing/Undressing Upper body dressing Upper body dressing/undressing activity did not occur (including orthotics): Safety/medical concerns What is the patient wearing?: Pull over shirt    Upper body assist Assist Level: Total Assistance - Patient < 25%    Lower Body Dressing/Undressing Lower body dressing      What is the patient wearing?: Incontinence brief,Pants     Lower body assist Assist for lower body dressing: Dependent - Patient 0%     Toileting Toileting Toileting Activity did not occur (Clothing management and hygiene only): N/A (no void or bm)  Toileting assist Assist for toileting: 2 Helpers     Transfers Chair/bed transfer  Transfers assist  Chair/bed transfer activity did not occur: Safety/medical concerns  Chair/bed transfer assist level: Moderate Assistance - Patient 50 - 74%     Locomotion Ambulation   Ambulation assist   Ambulation activity did not occur: Safety/medical concerns  Assist level: 2 helpers Assistive device: Hand held assist Max distance: 20   Walk 10 feet activity   Assist  Walk 10 feet activity did not occur: Safety/medical concerns  Assist level: 2 helpers Assistive device: Hand held assist   Walk 50 feet activity   Assist Walk 50 feet with 2 turns activity did not occur: Safety/medical concerns  Assist level: 2 helpers Assistive device: Walker-Eva    Walk 150 feet  activity   Assist Walk 150 feet activity did not occur: Safety/medical concerns         Walk 10 feet on uneven surface  activity   Assist Walk 10 feet on uneven surfaces activity did not occur: Safety/medical concerns         Wheelchair     Assist Will patient use wheelchair at discharge?: Yes (pt may benefit from [facility] assessment however unsafe to attempt this AM; per PT long term goals)   Wheelchair activity did not occur:  Safety/medical concerns         Wheelchair 50 feet with 2 turns activity    Assist    Wheelchair 50 feet with 2 turns activity did not occur: Safety/medical concerns       Wheelchair 150 feet activity     Assist  Wheelchair 150 feet activity did not occur: Safety/medical concerns       Blood pressure 130/70, pulse 71, temperature 98.4 F (36.9 C), temperature source Oral, resp. rate 17, height 5\' 7"  (1.702 m), weight 58.2 kg, SpO2 100 %.  Medical Problem List and Plan: 1.  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             - SNF placement still pending.   - Advanced WM changes on MRI 4/12 likely related to SVD. Pt + for cocaine on admit.    QD therapies    -Continue CIR 2.  Impaired mobility -DVT/anticoagulation: Continue Lovenox             -antiplatelet therapy: Aspirin 325 mg 3. Pain Management: Continue Tylenol as needed 4. Agitation/psychosis: Antipsychotic: Continue zyprexa 5mg  hs and 2.5mg  daily  - Continue bilateral mittens as needed  -depakote 500mg  tid  -melatonin 3mg  HS    -limit prn haldol    -continue same doses of zyprexa as this seems to have achieved the best balance for her 5. Neuropsych: This patient is capable of making decisions on her own behalf. 6. Skin/Wound Care: Routine skin checks 7. Fluids/Electrolytes/Nutrition:    I personally reviewed the patient's labs today.    -push PO fluids, BUN sl elevated---recheck BMET today5/27 Filed Weights   10/19/20 0438 10/19/20 1721 10/20/20 0329  Weight: 58.4 kg 58.4 kg 58.2 kg    Stable 6/4, continue to monitor 8.  Seizure disorder.  Vimpat 200 mg twice daily, valproic acid 1000 mg twice daily.  EEG negative  No breakthrough seizures on 5/30 9.  Hypertension.  Norvasc 10 mg daily     Continue lopressor 25mg  bid Vitals:   10/20/20 0848 10/20/20 1301  BP: 136/63 130/70  Pulse: 71 71  Resp:   17  Temp:  98.4 F (36.9 C)  SpO2:  100%  bordereline controlled 6/2 10.  History of bilateral cerebral convexity infarction frontal region.  CIR 08/19/2019-08/27/2019 11.  Hyperlipidemia.  Continue Lipitor 12.  History of polysubstance abuse.  Urine drug screen positive cocaine.   13. AKI:   Creatinine 1.21 on 5/29, BUN still elevated---recheck labs monday  14. UTI: UCX never performed.     -f/u urine cx negative 15. Dysphagia:     -D1, thins  Intake has been more consistent  LOS: 50 days A FACE TO FACE EVALUATION WAS PERFORMED  12/20/20 Cherri Yera 10/20/2020, 5:26 PM

## 2020-10-21 NOTE — Progress Notes (Signed)
PROGRESS NOTE   Subjective/Complaints: Screaming during transfer to commode, as per Dois Davenport she gets anxious during transfers. Had to pry hands off bar to help her sit on commode. She did calm down afterwards.  No other concerns from therapy  ROS: Limited due to cognitive/behavioral    Objective:   No results found. No results for input(s): WBC, HGB, HCT, PLT in the last 72 hours. No results for input(s): NA, K, CL, CO2, GLUCOSE, BUN, CREATININE, CALCIUM in the last 72 hours.  Intake/Output Summary (Last 24 hours) at 10/21/2020 1400 Last data filed at 10/21/2020 1241 Gross per 24 hour  Intake 600 ml  Output --  Net 600 ml        Physical Exam: Vital Signs Blood pressure 140/68, pulse 78, temperature 98.5 F (36.9 C), temperature source Oral, resp. rate 16, height 5\' 7"  (1.702 m), weight 58.7 kg, SpO2 99 %.  Gen: no distress, normal appearing HEENT: oral mucosa pink and moist, NCAT Cardio: Reg rate Chest: normal effort, normal rate of breathing Abd: soft, non-distended Ext: no edema Psych: pleasantly confused. Musc: No edema in extremities.  No tenderness in extremities. Neuro: Alert, confused, follows basic commands, answers simple questions    Assessment/Plan: 1. Functional deficits which require 3+ hours per day of interdisciplinary therapy in a comprehensive inpatient rehab setting.  Physiatrist is providing close team supervision and 24 hour management of active medical problems listed below.  Physiatrist and rehab team continue to assess barriers to discharge/monitor patient progress toward functional and medical goals  Care Tool:  Bathing  Bathing activity did not occur: Safety/medical concerns Body parts bathed by patient: Face   Body parts bathed by helper: Right arm,Left arm,Chest,Abdomen,Front perineal area,Buttocks,Right upper leg,Left upper leg,Left lower leg,Right lower leg,Face Body parts n/a:  Left lower leg,Left upper leg,Right upper leg,Right lower leg,Buttocks,Abdomen,Front perineal area,Left arm,Right arm,Chest   Bathing assist Assist Level: Total Assistance - Patient < 25%     Upper Body Dressing/Undressing Upper body dressing Upper body dressing/undressing activity did not occur (including orthotics): Safety/medical concerns What is the patient wearing?: Pull over shirt    Upper body assist Assist Level: Total Assistance - Patient < 25%    Lower Body Dressing/Undressing Lower body dressing      What is the patient wearing?: Incontinence brief,Pants     Lower body assist Assist for lower body dressing: Dependent - Patient 0%     Toileting Toileting Toileting Activity did not occur (Clothing management and hygiene only): N/A (no void or bm)  Toileting assist Assist for toileting: 2 Helpers     Transfers Chair/bed transfer  Transfers assist  Chair/bed transfer activity did not occur: Safety/medical concerns  Chair/bed transfer assist level: Moderate Assistance - Patient 50 - 74%     Locomotion Ambulation   Ambulation assist   Ambulation activity did not occur: Safety/medical concerns  Assist level: 2 helpers Assistive device: Hand held assist Max distance: 20   Walk 10 feet activity   Assist  Walk 10 feet activity did not occur: Safety/medical concerns  Assist level: 2 helpers Assistive device: Hand held assist   Walk 50 feet activity   Assist Walk 50 feet with  2 turns activity did not occur: Safety/medical concerns  Assist level: 2 helpers Assistive device: Walker-Eva    Walk 150 feet activity   Assist Walk 150 feet activity did not occur: Safety/medical concerns         Walk 10 feet on uneven surface  activity   Assist Walk 10 feet on uneven surfaces activity did not occur: Safety/medical concerns         Wheelchair     Assist Will patient use wheelchair at discharge?: Yes (pt may benefit from [facility] assessment  however unsafe to attempt this AM; per PT long term goals)   Wheelchair activity did not occur: Safety/medical concerns         Wheelchair 50 feet with 2 turns activity    Assist    Wheelchair 50 feet with 2 turns activity did not occur: Safety/medical concerns       Wheelchair 150 feet activity     Assist  Wheelchair 150 feet activity did not occur: Safety/medical concerns       Blood pressure 140/68, pulse 78, temperature 98.5 F (36.9 C), temperature source Oral, resp. rate 16, height 5\' 7"  (1.702 m), weight 58.7 kg, SpO2 99 %.  Medical Problem List and Plan: 1.  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             - SNF placement still pending.   - Advanced WM changes on MRI 4/12 likely related to SVD. Pt + for cocaine on admit.    QD therapies    -Continue CIR 2.  Impaired mobility -DVT/anticoagulation: Continue Lovenox             -antiplatelet therapy: Aspirin 325 mg 3. Pain Management: Continue Tylenol as needed 4. Agitation/psychosis: Antipsychotic: Continue zyprexa 5mg  hs and 2.5mg  daily  - Continue bilateral mittens as needed  -depakote 500mg  tid  -melatonin 3mg  HS    -limit prn haldol    -continue same doses of zyprexa as this seems to have achieved the best balance for her 5. Neuropsych: This patient is capable of making decisions on her own behalf. 6. Skin/Wound Care: Routine skin checks 7. Fluids/Electrolytes/Nutrition:    I personally reviewed the patient's labs today.    -push PO fluids, BUN sl elevated---recheck BMET today5/27 Filed Weights   10/19/20 1721 10/20/20 0329 10/21/20 0500  Weight: 58.4 kg 58.2 kg 58.7 kg    Stable 6/4, continue to monitor 8.  Seizure disorder.  Vimpat 200 mg twice daily, valproic acid 1000 mg twice daily.  EEG negative  No breakthrough seizures on 5/30 9.  Hypertension.  Norvasc 10 mg daily     Continue  lopressor 25mg  bid Vitals:   10/21/20 0417 10/21/20 1100  BP: (!) 142/83 140/68  Pulse: 76 78  Resp: 16 16  Temp: 98 F (36.7 C) 98.5 F (36.9 C)  SpO2: 100% 99%  bordereline controlled 6/5 10.  History of bilateral cerebral convexity infarction frontal region.  CIR 08/19/2019-08/27/2019 11.  Hyperlipidemia.  Continue Lipitor 12.  History of polysubstance abuse.  Urine drug screen positive cocaine.   13. AKI:   Creatinine 1.21 on 5/29, BUN still elevated---recheck labs monday  14. UTI: UCX never performed.     -f/u urine cx negative 15. Dysphagia:     -D1, thins  Intake has been more consistent  LOS: 51 days A FACE TO FACE EVALUATION WAS PERFORMED  Cindy [name] P Cayton Cuevas 10/21/2020, 2:00 PM

## 2020-10-21 NOTE — Progress Notes (Signed)
Occupational Therapy Session Note  Patient Details  Name: Cindy [name] MRN: [id number] Date of Birth: 31-Aug-1960  Today's Date: 10/21/2020 OT Individual Time: 1240-1325 OT Individual Time Calculation (min): 45 min    Short Term Goals: Week 7:  OT Short Term Goal 1 (Week 7): STG= LTG d/t ELOS  Skilled Therapeutic Interventions/Progress Updates:    Pt received supine, awake and alert. Pt stating yes to OT offering shower. Pt completed bed mobility to EOB with mod A. She was now quite restless and with full body ataxia as she attempted to impulsively transfer to the w/c. She required max A for stand  Pivot transfer. She was brought into the bathroom where she stated "I do have to pee". Pt was transferred to the [facility] over the toilet with max A. She was again very restless but with pillow placed behind her and deep tactile cues anteriorly she relaxed and voided both BM and urine. She stood with RUE on the grab bar and LUE on the w/c in front of her with mod A while OT performed total A peri hygiene. She completed stand pivot transfer from [facility] to the TIS w/c with max A. She again stated yes to wanting to take shower. Initiated transfer from TIS to tilting shower chair. Pt's LUE got a hold of the grab bar and combination of fear of falling anxiety and another BM initiating, pt began screaming and very restless. MD entered room and assisted in releasing pt's LUE from the grab bar for OT to then total A place pt back on shower chair. She was assisted over to [facility], max A functional mobility. She was incontinent of BM and then voided more BM. Again pt required total A for peri hygiene. Decided to not attempt shower d/t pt behavior today. Pt was transferred back to the TIS w/c and then to bed. Relaxing music turned on from computer to deescalate pt. Posey belt on and bed alarm set.   Therapy Documentation Precautions:  Precautions Precautions: Fall Precaution Comments: bilateral mitts and bedrails  padded Restrictions Weight Bearing Restrictions: No  Therapy/Group: Individual Therapy  Crissie Reese 10/21/2020, 6:27 AM

## 2020-10-21 NOTE — Progress Notes (Signed)
Speech Language Pathology Daily Session Note  Patient Details  Name: Cindy [name] MRN: [id number] Date of Birth: 1960-05-27  Today's Date: 10/21/2020 SLP Individual Time: 0800-0830 SLP Individual Time Calculation (min): 30 min   SLP Individual Time: 5284-1324 SLP Individual Time Calculation (min): 43 min  Short Term Goals: Week 8:   STGs=LTGs due to ELOS  Skilled Therapeutic Interventions: Session 1: Pt seen for skilled ST with focus on swallowing goals. Pt roused by nursing and SLP for AM meal, initially confused and mildly agitated, verbal redirection effective. Nurse states pt was demonstrating increased coughing with meals yesterday but no coughing with meds. Pt noted to have coughing episode after med administration this morning. SLP providing total A with feeding d/t confusion this morning. Pt tolerating Dys 1 with thin liquid via straw with minimal overt s/s aspiration, often times with perseverative rotary chew pattern even after swallow. Pt handed of to NT to continue to assist with feeding. Cont ST POC.  Session 2: Pt seen for skilled ST with focus on cognitive goals. Pt resting in bed listening to calming music, when SLP attempting to engage in therapeutic activities pt becoming mildly agitated and attempting to get up from bed. Pt repeating she needed to go find her brother, gentle education that brother is not here and pt needs to remain in bed at this time. NT deferring getting pt in wheelchair due to confusion/agitation. Pt participating in functional yes/no questions related to care/wants/needs with response 50% of time. Pt benefits from mod A cues to follow 1-step directions for repositioning in bed. Pt left in bed with alarm set and all needs within reach. Cont ST POC.   Pain Pain Assessment Pain Scale: 0-10 Pain Score: 0-No pain  Therapy/Group: Individual Therapy  Tacey Ruiz 10/21/2020, 8:26 AM

## 2020-10-21 NOTE — Plan of Care (Signed)
  Problem: Consults Goal: RH STROKE PATIENT EDUCATION Description: See Patient Education module for education specifics  10/21/2020 1012 by Mauro Kaufmann L, LPN Outcome: Progressing 10/21/2020 1011 by Reta Norgren L, LPN Outcome: Progressing   Problem: RH BOWEL ELIMINATION Goal: RH STG MANAGE BOWEL WITH ASSISTANCE Description: STG Manage Bowel with min Assistance. 10/21/2020 1012 by Mauro Kaufmann L, LPN Outcome: Progressing 10/21/2020 1011 by Mauro Kaufmann L, LPN Outcome: Progressing Goal: RH STG MANAGE BOWEL W/MEDICATION W/ASSISTANCE Description: STG Manage Bowel with Medication with min Assistance. 10/21/2020 1012 by Mauro Kaufmann L, LPN Outcome: Progressing 10/21/2020 1011 by Mauro Kaufmann L, LPN Outcome: Progressing   Problem: RH BLADDER ELIMINATION Goal: RH STG MANAGE BLADDER WITH ASSISTANCE Description: STG Manage Bladder With min Assistance 10/21/2020 1012 by Mauro Kaufmann L, LPN Outcome: Progressing 10/21/2020 1011 by Mauro Kaufmann L, LPN Outcome: Progressing   Problem: RH SKIN INTEGRITY Goal: RH STG MAINTAIN SKIN INTEGRITY WITH ASSISTANCE Description: STG Maintain Skin Integrity With min Assistance. 10/21/2020 1012 by Mauro Kaufmann L, LPN Outcome: Progressing 10/21/2020 1011 by Mauro Kaufmann L, LPN Outcome: Progressing Goal: RH STG ABLE TO PERFORM INCISION/WOUND CARE W/ASSISTANCE Description: STG Able To Perform Incision/Wound Care With min Assistance. 10/21/2020 1012 by Mauro Kaufmann L, LPN Outcome: Progressing 10/21/2020 1011 by Mauro Kaufmann L, LPN Outcome: Progressing   Problem: RH SAFETY Goal: RH STG ADHERE TO SAFETY PRECAUTIONS W/ASSISTANCE/DEVICE Description: STG Adhere to Safety Precautions With min Assistance/Device. 10/21/2020 1012 by Mauro Kaufmann L, LPN Outcome: Progressing 10/21/2020 1011 by Mauro Kaufmann L, LPN Outcome: Progressing Goal: RH STG DECREASED RISK OF FALL WITH ASSISTANCE Description: STG Decreased Risk of Fall With min Assistance. 10/21/2020 1012 by Mauro Kaufmann L,  LPN Outcome: Progressing 10/21/2020 1011 by Shynice Sigel L, LPN Outcome: Progressing   Problem: RH PAIN MANAGEMENT Goal: RH STG PAIN MANAGED AT OR BELOW PT'S PAIN GOAL Description: <3 on a 0-10 pain scale. 10/21/2020 1012 by Mauro Kaufmann L, LPN Outcome: Progressing 10/21/2020 1011 by Mauro Kaufmann L, LPN Outcome: Progressing

## 2020-10-21 NOTE — Plan of Care (Signed)
  Problem: Consults Goal: RH STROKE PATIENT EDUCATION Description: See Patient Education module for education specifics  Outcome: Progressing   Problem: RH BOWEL ELIMINATION Goal: RH STG MANAGE BOWEL WITH ASSISTANCE Description: STG Manage Bowel with min Assistance. Outcome: Progressing Goal: RH STG MANAGE BOWEL W/MEDICATION W/ASSISTANCE Description: STG Manage Bowel with Medication with min Assistance. Outcome: Progressing   Problem: RH BLADDER ELIMINATION Goal: RH STG MANAGE BLADDER WITH ASSISTANCE Description: STG Manage Bladder With min Assistance Outcome: Progressing   Problem: RH SKIN INTEGRITY Goal: RH STG MAINTAIN SKIN INTEGRITY WITH ASSISTANCE Description: STG Maintain Skin Integrity With min Assistance. Outcome: Progressing Goal: RH STG ABLE TO PERFORM INCISION/WOUND CARE W/ASSISTANCE Description: STG Able To Perform Incision/Wound Care With min Assistance. Outcome: Progressing   Problem: RH SAFETY Goal: RH STG ADHERE TO SAFETY PRECAUTIONS W/ASSISTANCE/DEVICE Description: STG Adhere to Safety Precautions With min Assistance/Device. Outcome: Progressing Goal: RH STG DECREASED RISK OF FALL WITH ASSISTANCE Description: STG Decreased Risk of Fall With min Assistance. Outcome: Progressing   Problem: RH PAIN MANAGEMENT Goal: RH STG PAIN MANAGED AT OR BELOW PT'S PAIN GOAL Description: <3 on a 0-10 pain scale. Outcome: Progressing

## 2020-10-22 LAB — BASIC METABOLIC PANEL
Anion gap: 8 (ref 5–15)
BUN: 37 mg/dL — ABNORMAL HIGH (ref 8–23)
CO2: 25 mmol/L (ref 22–32)
Calcium: 9 mg/dL (ref 8.9–10.3)
Chloride: 105 mmol/L (ref 98–111)
Creatinine, Ser: 1.18 mg/dL — ABNORMAL HIGH (ref 0.44–1.00)
GFR, Estimated: 53 mL/min — ABNORMAL LOW (ref >60)
Glucose, Bld: 104 mg/dL — ABNORMAL HIGH (ref 70–99)
Potassium: 4.3 mmol/L (ref 3.5–5.1)
Sodium: 138 mmol/L (ref 135–145)

## 2020-10-22 NOTE — Progress Notes (Signed)
Occupational Therapy Session Note  Patient Details  Name: Cindy [name] MRN: [id number] Date of Birth: [birth year]  Today's Date: 10/22/2020 OT Individual Time: 8127-5170 OT Individual Time Calculation (min): 60 min    Short Term Goals: Week 7:  OT Short Term Goal 1 (Week 7): STG= LTG d/t ELOS  Skilled Therapeutic Interventions/Progress Updates:    Pt received supine with no indication of pain, agreeable to session. Pt transferred to EOB with mod A. Pt completed LB dressing from EOB with improvement in sequencing/motor planning, requiring min A to thread over each leg and then max A to pull up in standing. Pt required mod facilitation to hold each leg into hip flexion to be able to reach to don socks. Poor error recognition to realize socks were donned backwards, but mod A overall to don. Pt completed stand pivot transfer to the TIS w/c with max A. Pt reported she needed to urinate. She completed stand pivot transfer to the [facility] with max A. Total A for clothing management. Continent urine void. Max A for peri hygiene. Pt returned to TIS w/c. At the sink pt was able to follow basic commands of washing hands and brushing hair. She required min cueing for initiation and follow through of motor plan. Pt was taken to the ADL apt where she completed functional sorting task. She had 100% accuracy in sorting fork, knives, and spoons, however was unable to functionally name items despite cueing. Next, activity completed focus on with functional language, naming food items. Pt required max cueing to name items and to terminate perseverative word repetition. Pt completed functional towel folding task while sitting EOM unsupported with mod cueing to sequence. Pt was left sitting up at the nurses desk in the TIS w/c. Chair alarm set.   Therapy Documentation Precautions:  Precautions Precautions: Fall Precaution Comments: bilateral mitts and bedrails padded Restrictions Weight Bearing Restrictions:  No  Therapy/Group: Individual Therapy  Crissie Reese 10/22/2020, 6:22 AM

## 2020-10-22 NOTE — Progress Notes (Signed)
Patient ID: Cindy [name], female   DOB: 03/01/1961, 59 y.o.   MRN: [id number]  SWleft message for [name][facility] ([phone])to follow up on status.SW waiting on follow-up.   Lindsea Olivar, MSW, LCSWA Office: [phone] Cell: [phone] Fax: [phone] 

## 2020-10-22 NOTE — Progress Notes (Signed)
Physical Therapy Session Note  Patient Details  Name: Cindy [name] MRN: [id number] Date of Birth: [birth year]  Today's Date: 10/22/2020 PT Individual Time: 1420-1510 PT Individual Time Calculation (min): 50 min   Short Term Goals: Week 7:  PT Short Term Goal 1 (Week 7): STG=LTG, patient is pending SNF placemet.  Skilled Therapeutic Interventions/Progress Updates:     Patient in bed upon PT arrival. Patient alert and agreeable to PT session. Patient reported moderate abdominal pain at beginning of session, RN made aware and reported that the patient had just had a strawberry Ensure, but had not complaints after. PT provided repositioning, rest breaks, and distraction as pain interventions throughout session. Noted mild abdominal bloating, soft, and not tender to palpation. Patient used emesis bag x2 to spit, then reported that her pain resolved with mobility sitting EOB following a large belch.   Patient with improved awareness of deficits, clarity of speech, and appropriate conversation today. Continues to perseverate on topics needing redirection to stop repeated a specific phrase. Patient informed PT that she has diminished sensation in her R arm, stating, "I can only feel this arm a little." She also reported that she keeps dropping things she is holding and does not understand why. 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 Also discussed how reduced sensation can affect grasp and fine motor activities.   Therapeutic Activity: Bed Mobility: Patient performed supine to/from sit with min A for trunk or lower extremity support due to ataxia. Provided verbal cues for initiation. Transfers: Patient performed stand/squat pivot without AD bed<>TIS w/c, arm chair>TIS w/c, TIS w/c<>BSC over the toilet, and sit to/from stand x2 with RW with mod A for all transfers due to posterior bias and significant trunk flexion. Provided verbal cues for hand  placement, scooting forward, forward weight shift, and hip/trunk extension. Patient was continent of bladder during toileting, performed peri-care with set-up assist and total A for lower body dressing. Required 3 attempts and max A for patient to stand to allow therapist to pull up her pants due to decreased motor planning with use of grab bar.  Gait Training:  Patient ambulated 25 feet and 10 feet using RW with mod-max A due to posterior bias and facilitation for hip/trunk extension. Ambulated with forward trunk flexion/hip flexion, increased keen extension in stance, and reduced step height and length R>L. Provided verbal cues for erect posture, looking ahead, and facilitation for AD management for safety.  Wheelchair Mobility:  Patient was transported in the TIS w/c with total A throughout session for energy conservation and time management.  Patient in bed at end of session with breaks locked, bed and Posey belt alarm set, and all needs within reach.    Therapy Documentation Precautions:  Precautions Precautions: Fall Precaution Comments: bilateral mitts and bedrails padded Restrictions Weight Bearing Restrictions: No   Therapy/Group: Individual Therapy  Ilamae Geng L Aeden Matranga PT, DPT  10/22/2020, 4:29 PM

## 2020-10-22 NOTE — Progress Notes (Signed)
Nutrition Follow-up  DOCUMENTATION CODES:   Non-severe (moderate) malnutrition in context of chronic illness  INTERVENTION:  ContinueEnsure Enlive poTID, each supplement provides 350 kcal and 20 grams of protein.  Encourage adequate PO intake.  NUTRITION DIAGNOSIS:   Moderate Malnutrition related to chronic illness as evidenced by moderate fat depletion,moderate muscle depletion; ongoing  GOAL:   Patient will meet greater than or equal to 90% of their needs; met  MONITOR:   PO intake,Supplement acceptance,Diet advancement,Labs,Weight trends  REASON FOR ASSESSMENT:   Malnutrition Screening Tool    ASSESSMENT:   Pt with a PMH significant for HTN, HLD, bilateral cerebral convexity infarct frontal region receiving CIR 08/19/19-08/27/19, seizure with prior history of status epilepticus, and polysubstance abuse presented 08/23/2020 with AMS/slurred speech, R-sided weakness as well as reports of shaking and jerking episodes lasting 5-10 minutes. Cranial CT/MRI scan was performed and was negative for acute changes but did show remote lacunar infarct. EEG suggestive of cortical dysfunction arising from left hemisphere likely 2/2 underlying structural abnormality post ictal state no seizure or definite epileptiform discharges. Admitted to CIR 4/15.  Pt continues on a dysphagia 1 diet with thin liquids. Meal completion has been 80-100%. Pt with good appetite. Pt currently has Ensure ordered and has been consuming them. RD to continue with current nutritional supplements to aid in caloric and protein needs. SNF placement pending.   Labs and medications reviewed.   Diet Order:   Diet Order            DIET - DYS 1 Room service appropriate? Yes; Fluid consistency: Thin  Diet effective now                 EDUCATION NEEDS:   Not appropriate for education at this time  Skin:  Skin Assessment: Reviewed RN Assessment  Last BM:  6/4  Height:   Ht Readings from Last 1 Encounters:   09/03/20 _0  (1.702 m)    Weight:   Wt Readings from Last 1 Encounters:  10/22/20 59.5 kg   BMI:  Body mass index is 20.54 kg/m.  Estimated Nutritional Needs:   Kcal:  1800-2000  Protein:  90-100 grams  Fluid:  >1.8L  Corrin Parker, MS, RD, LDN RD pager number/after hours weekend pager number on Amion.

## 2020-10-22 NOTE — Progress Notes (Signed)
PROGRESS NOTE   Subjective/Complaints: No unexpected issues this weekend. Fairly calm this morning  ROS: Limited due to cognitive/behavioral   Objective:   No results found. No results for input(s): WBC, HGB, HCT, PLT in the last 72 hours. Recent Labs    10/22/20 0745  NA 138  K 4.3  CL 105  CO2 25  GLUCOSE 104*  BUN 37*  CREATININE 1.18*  CALCIUM 9.0    Intake/Output Summary (Last 24 hours) at 10/22/2020 1032 Last data filed at 10/22/2020 0700 Gross per 24 hour  Intake 640 ml  Output --  Net 640 ml        Physical Exam: Vital Signs Blood pressure 134/79, pulse 68, temperature 97.8 F (36.6 C), resp. rate 16, height 5\' 7"  (1.702 m), weight 59.5 kg, SpO2 100 %.  Constitutional: No distress . Vital signs reviewed. HEENT: EOMI, oral membranes moist Neck: supple Cardiovascular: RRR without murmur. No JVD    Respiratory/Chest: CTA Bilaterally without wheezes or rales. Normal effort    GI/Abdomen: BS +, non-tender, non-distended Ext: no clubbing, cyanosis, or edema Psych: pleasant and confused Musc: No edema in extremities.  No tenderness in extremities. Neuro: Alert, confused, follows basic commands, moves all 4's    Assessment/Plan: 1. Functional deficits which require 3+ hours per day of interdisciplinary therapy in a comprehensive inpatient rehab setting.  Physiatrist is providing close team supervision and 24 hour management of active medical problems listed below.  Physiatrist and rehab team continue to assess barriers to discharge/monitor patient progress toward functional and medical goals  Care Tool:  Bathing  Bathing activity did not occur: Safety/medical concerns Body parts bathed by patient: Face   Body parts bathed by helper: Right arm,Left arm,Chest,Abdomen,Front perineal area,Buttocks,Right upper leg,Left upper leg,Left lower leg,Right lower leg,Face Body parts n/a: Left lower leg,Left  upper leg,Right upper leg,Right lower leg,Buttocks,Abdomen,Front perineal area,Left arm,Right arm,Chest   Bathing assist Assist Level: Total Assistance - Patient < 25%     Upper Body Dressing/Undressing Upper body dressing Upper body dressing/undressing activity did not occur (including orthotics): Safety/medical concerns What is the patient wearing?: Pull over shirt    Upper body assist Assist Level: Total Assistance - Patient < 25%    Lower Body Dressing/Undressing Lower body dressing      What is the patient wearing?: Incontinence brief,Pants     Lower body assist Assist for lower body dressing: Dependent - Patient 0%     Toileting Toileting Toileting Activity did not occur (Clothing management and hygiene only): N/A (no void or bm)  Toileting assist Assist for toileting: 2 Helpers     Transfers Chair/bed transfer  Transfers assist  Chair/bed transfer activity did not occur: Safety/medical concerns  Chair/bed transfer assist level: Moderate Assistance - Patient 50 - 74%     Locomotion Ambulation   Ambulation assist   Ambulation activity did not occur: Safety/medical concerns  Assist level: 2 helpers Assistive device: Hand held assist Max distance: 20   Walk 10 feet activity   Assist  Walk 10 feet activity did not occur: Safety/medical concerns  Assist level: 2 helpers Assistive device: Hand held assist   Walk 50 feet activity   Assist  Walk 50 feet with 2 turns activity did not occur: Safety/medical concerns  Assist level: 2 helpers Assistive device: Walker-Eva    Walk 150 feet activity   Assist Walk 150 feet activity did not occur: Safety/medical concerns         Walk 10 feet on uneven surface  activity   Assist Walk 10 feet on uneven surfaces activity did not occur: Safety/medical concerns         Wheelchair     Assist Will patient use wheelchair at discharge?: Yes (pt may benefit from [facility] assessment however unsafe to  attempt this AM; per PT long term goals)   Wheelchair activity did not occur: Safety/medical concerns         Wheelchair 50 feet with 2 turns activity    Assist    Wheelchair 50 feet with 2 turns activity did not occur: Safety/medical concerns       Wheelchair 150 feet activity     Assist  Wheelchair 150 feet activity did not occur: Safety/medical concerns       Blood pressure 134/79, pulse 68, temperature 97.8 F (36.6 C), resp. rate 16, height 5\' 7"  (1.702 m), weight 59.5 kg, SpO2 100 %.  Medical Problem List and Plan: 1.  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             - SNF placement still pending.     QD therapies   2.  Impaired mobility -DVT/anticoagulation: Continue Lovenox             -antiplatelet therapy: Aspirin 325 mg 3. Pain Management: Continue Tylenol as needed 4. Agitation/psychosis: Antipsychotic: Continue zyprexa 5mg  hs and 2.5mg  daily  - Continue bilateral mittens as needed  -depakote 500mg  tid  -melatonin 3mg  HS    -limit prn haldol    -maintain current doses of meds 5. Neuropsych: This patient is capable of making decisions on her own behalf. 6. Skin/Wound Care: Routine skin checks 7. Fluids/Electrolytes/Nutrition:    6/6 BUN still elevated today but stable from last week   -continue to encourage po Filed Weights   10/20/20 0329 10/21/20 0500 10/22/20 0528  Weight: 58.2 kg 58.7 kg 59.5 kg    Stable 6/6, continue to monitor 8.  Seizure disorder.  Vimpat 200 mg twice daily, valproic acid 1000 mg twice daily.  EEG negative  No breakthrough seizures during this admit 9.  Hypertension.  Norvasc 10 mg daily     Continue lopressor 25mg  bid Vitals:   10/21/20 1935 10/22/20 0528  BP: 128/80 134/79  Pulse: 87 68  Resp: 16 16  Temp: 98.6 F (37 C) 97.8 F (36.6 C)  SpO2: 97% 100%  bordereline controlled 6/6 10.  History of  bilateral cerebral convexity infarction frontal region.  CIR 08/19/2019-08/27/2019 11.  Hyperlipidemia.  Continue Lipitor 12.  History of polysubstance abuse.  Urine drug screen positive cocaine.   13. AKI:   Creatinine 1.18---stable 14. UTI: UCX never performed.     -f/u urine cx negative 15. Dysphagia:     -D1, thins  -Tolerating, Intake has been more consistent  LOS: 52 days A FACE TO FACE EVALUATION WAS PERFORMED  10/22/2020, 10:32 AM

## 2020-10-22 NOTE — Progress Notes (Signed)
Speech Language Pathology Daily Session Note  Patient Details  Name: Cindy [name] MRN: [id number] Date of Birth: 1961/03/09  Today's Date: 10/22/2020 SLP Individual Time: 0720-0800 SLP Individual Time Calculation (min): 40 min  Short Term Goals: Week 8: STGs=LTGs due to ELOS  Skilled Therapeutic Interventions: Skilled treatment session focused on speech and dysphagia goals. SLP facilitated session by providing skilled observation with breakfast meal of Dys. 1 textures with thin liquids. Patient with one coughing episode with thin liquids via multiple sips which was eliminated with cues for small, single sips via straw. No overt s/s noted with Dys. 1 textures. Recommend patient continue current diet. Patient was ~90% intelligible at the phrase and sentence level and when clinician needed repetition, patient automatically slowed her speech to maximize intelligibility. Patient left upright in bed with alarm on and all needs within reach. Continue with current plan of care.      Pain No/Denies Pain  Therapy/Group: Individual Therapy  Yerick Eggebrecht 10/22/2020, 8:03 AM

## 2020-10-23 LAB — HEPATIC FUNCTION PANEL
ALT: 13 U/L (ref 0–44)
AST: 15 U/L (ref 15–41)
Albumin: 2.9 g/dL — ABNORMAL LOW (ref 3.5–5.0)
Alkaline Phosphatase: 51 U/L (ref 38–126)
Bilirubin, Direct: <0.1 mg/dL (ref 0.0–0.2)
Total Bilirubin: 0.7 mg/dL (ref 0.3–1.2)
Total Protein: 6.8 g/dL (ref 6.5–8.1)

## 2020-10-23 LAB — VALPROIC ACID LEVEL: Valproic Acid Lvl: 60 ug/mL (ref 50.0–100.0)

## 2020-10-23 NOTE — Progress Notes (Signed)
Physical Therapy Session Note  Patient Details  Name: Cindy [name] MRN: [id number] Date of Birth: 08-11-1960  Today's Date: 10/23/2020 PT Individual Time: 0930-1000 PT Individual Time Calculation (min): 30 min   Short Term Goals: Week 7:  PT Short Term Goal 1 (Week 7): STG=LTG, patient is pending SNF placemet.  Skilled Therapeutic Interventions/Progress Updates:     Patient in bed with RN providing Ensure upon PT arrival. Patient alert and agreeable to PT session. Patient denied pain during session.   Patient incontinent of bladder and bowl at beginning of session. Patient removed brief impulsively and independently in the bed. Performed rolling R/L with supervision with use of bed rails, able to follow cues to hold position for peri-care, donning a new brief, and removal of bed sheets due to soiling with total A. Patient performed supine>sit with min A for trunk control, required increased time to initiate moving lower extremities due to motor planning. Patient donned pants with total A and doffed/donned shirt with min A sitting EOB with supervision for trunk control with multimodal cues for reduced posterior bias in sitting. Patient performed sit to stand with min A pulling up on w/c arm rests to pull up pants and performed pivot to TIS w/c with min/mod A, patient turned >180 deg to the L to sit in the w/c rather than following cues to turn 90 deg to the R.  Patient sat in front of the sink and attempted to put on deodorant with set-up assist, however, placed deodorant over her shirt, required max hand over hand assist for patient to apply under her shirt. Patient combed her hair and donned her glasses with set-up assist.   Patient in TIS w/c at the nurses station for supervision at end of session with breaks locked, seat belt alarm set, and a news paper to look at.    Therapy Documentation Precautions:  Precautions Precautions: Fall Precaution Comments: bilateral mitts and bedrails  padded Restrictions Weight Bearing Restrictions: No   Therapy/Group: Individual Therapy  Aurie Harroun L Cecile Guevara PT, DPT  10/23/2020, 12:33 PM

## 2020-10-23 NOTE — Patient Care Conference (Signed)
Inpatient RehabilitationTeam Conference and Plan of Care Update Date: 10/23/2020   Time: 10:08 AM    Patient Name: Cindy [name]      Medical Record Number: [id number]  Date of Birth: 03/27/1961 Sex: Female         Room/Bed: 4W08C/4W08C-01 Payor Info: Payor: MEDICAID [place] / Plan: MEDICAID [place] / Product Type: *No Product type* /    Admit Date/Time:  08/31/2020  5:12 PM  Primary Diagnosis:  Acute metabolic encephalopathy  Hospital Problems: Principal Problem:   Acute metabolic encephalopathy Active Problems:   Dysphagia   Agitation    Expected Discharge Date: Expected Discharge Date:  (SNF)  Team Members Present: Physician leading conference: Dr. Faith Rogue Care Coodinator Present: [name], LCSWA;Taurus Willis Marlyne Beards, RN, BSN, CRRN Nurse Present: Kennyth Arnold, RN PT Present: Serina Cowper, PT OT Present: Jake Shark, OT SLP Present: Feliberto Gottron, SLP PPS Coordinator present : Edson Snowball, PT     Current Status/Progress Goal Weekly Team Focus  Bowel/Bladder   Patient is continent and incontinent of bladder, incontnient of bowel, LBM 10/22/20  restore continence  Assess toileting needs, time toilet patient Q2 hrs while awake   Swallow/Nutrition/ Hydration   Dys. 1 textures with thin liquids, Mod A  Mod A  use of swallow strategies   ADL's   At her best, mod A UB ADLs, max A LB. Very restless/agitated with toileting needs  POC adjusted to d/c ADLs and reflect caregiver total A at SNF level  ADLs, transfers, cognition, sequencing/attention   Mobility   min mod A overall , can fluctuate to max A with fatigue or frustration, gait RW >30 ft, 4 steps with B rails, sitting OOB 2-3 hours 4-5 days per week  mod A overall  Activity tolerance, functional mobility, posture, balance, safety awareness, motor planning   Communication   Mod A  Mod A  use of speech intelligibility strategies   Safety/Cognition/ Behavioral Observations  Max-Total A  Max A  initiation,  attention, orientation   Pain   No c/o or indication of pain  remain free of pain  QS/PRN assessment   Skin   SKin intact  skin remain intact and free from infection  QS/PRN assessment of skin     Discharge Planning:  D/c pending SNF approval from corporate office.   Team Discussion: MD checking lab levels. Incontinent B/B, confused and impulsive. Still waiting on corporate office to decide. MD reports to do a state wide search or tell husband he will need to take her home on Friday. Patient on target to meet rehab goals: no, at best she is mod assist for upper body ADL's, max assist for lower body ADL's. Min to max assist with the stairs. Reports that she can't feel her right arm. SLP reports her speech is more clear, still on Dys 1, thin liquids. Tends to drink liquids really fast which induces increased coughing, trying to educate on taking one sip at a time.  *See Care Plan and progress notes for long and short-term goals.   Revisions to Treatment Plan:  Not at this time.  Teaching Needs: Family education, medication management, behavioral management, transfer training, gait training, stair training, balance training, endurance training, safety awareness.  Current Barriers to Discharge: Decreased caregiver support, Medical stability, Home enviroment access/layout, Incontinence, Lack of/limited family support, Insurance for SNF coverage, Medication compliance and Behavior  Possible Resolutions to Barriers: Continue current medications, provide emotional support.     Medical Summary Current Status: no major behavioral  changes. tolerating meds  Barriers to Discharge: Behavior   Possible Resolutions to Barriers/Weekly Focus: behavioral rx, daily assessment of pt data/labs   Continued Need for Acute Rehabilitation Level of Care: The patient requires daily medical management by a physician with specialized training in physical medicine and rehabilitation for the following  reasons: Direction of a multidisciplinary physical rehabilitation program to maximize functional independence : Yes Medical management of patient stability for increased activity during participation in an intensive rehabilitation regime.: Yes Analysis of laboratory values and/or radiology reports with any subsequent need for medication adjustment and/or medical intervention. : Yes   I attest that I was present, lead the team conference, and concur with the assessment and plan of the team.   Tennis Must 10/23/2020, 1:33 PM

## 2020-10-23 NOTE — Progress Notes (Signed)
Patient ID: Cindy [name], female   DOB: March 29, 1961, 60 y.o.   MRN: [id number]  [facility]left [facility] for[facility]bie Powell[phone]to follow up on status.SW waiting on follow-up.  *Per medical team, pt d/c date will now be 6/10. SW left message for [name]/ADmissions with [facility] again, to see if there were any updates from corporate office since pt now has a d/c date.   SW made efforts to make contact with pt husband [name] [phone]) however no answer. SW will continue to make efforts since he does not have voicemail set up.   [name], MSW, LCSWA Office: [phone] Cell: [phone] Fax: [phone]

## 2020-10-23 NOTE — Progress Notes (Signed)
Speech Language Pathology Daily Session Note  Patient Details  Name: JADELYN ELKS MRN: [id number] Date of Birth: Mar 03, 1961  Today's Date: 10/23/2020 SLP Individual Time: 1300-1330 SLP Individual Time Calculation (min): 30 min  Short Term Goals: Week 8: STGs=LTGs due to ELOS  Skilled Therapeutic Interventions: Skilled treatment session focused on cognitive goals. SLP facilitated session by providing extra time and Min A verbal cues for a basic task of sequencing task of 3 picture cards.  Patient was inedependently oriented to "rehab center" and could recall the city but not the hospital. SLP provided both a sign and a calendar for orientation to place and date that patient was able to utilize with supervision verbal cues. Patient was ~90% intelligible at the word and phrase level with cues needed for a slow rate. Patient requested to get back into bed at end of session and left with alarm on and all needs within reach. Continue with current plan of care.      Pain No/Denies Pain   Therapy/Group: Individual Therapy  Lealer Marsland 10/23/2020, 1:51 PM

## 2020-10-23 NOTE — Progress Notes (Signed)
Occupational Therapy Session Note  Patient Details  Name: Cindy [name] MRN: [id number] Date of Birth: 01-Apr-1961  Today's Date: 10/23/2020 OT Individual Time: 1130-1155 OT Individual Time Calculation (min): 25 min    Short Term Goals: Week 7:  OT Short Term Goal 1 (Week 7): STG= LTG d/t ELOS  Skilled Therapeutic Interventions/Progress Updates:    Pt received supine with no c/o pain, agreeable to OT session. Pt completed bed mobility to EOB with no cueing for initiation. Pt required max A to don shoes EOB, but attempted to don himself. Pt completed stand pivot transfer to the TIS wc with max A. She was taken to the therapy gym via TIS. Session focused on upright posture with hip rotation anteriorly in standing. Used the sara plus initially but sling did not fit pt properly. Carley Hammed walker used next with mirror placed anteriorly for visual cue. Pt required max facilitation for bringing hips forward and for achieving full upright posture. Pt became perseverative on placing her chin on the hand rest of the eva walker and was unable to be directly away. She returned to sitting and was left sitting up at the nurses desk with chair alarm belt set.   Therapy Documentation Precautions:  Precautions Precautions: Fall Precaution Comments: bilateral mitts and bedrails padded Restrictions Weight Bearing Restrictions: No   Therapy/Group: Individual Therapy  Crissie Reese 10/23/2020, 6:42 AM

## 2020-10-24 MED ORDER — PANTOPRAZOLE SODIUM 40 MG PO PACK
40.0000 mg | PACK | Freq: Every day | ORAL | Status: DC
  Administered 2020-10-25 – 2020-10-27 (×3): 40 mg via ORAL
  Filled 2020-10-24 (×3): qty 20

## 2020-10-24 NOTE — Progress Notes (Signed)
Physical Therapy Session Note  Patient Details  Name: Cindy [name] MRN: [id number] Date of Birth: 11-May-1961  Today's Date: 10/24/2020 PT Individual Time: 1500-1530 PT Individual Time Calculation (min): 30 min   Short Term Goals: Week 7:  PT Short Term Goal 1 (Week 7): STG=LTG, patient is pending SNF placemet.  Skilled Therapeutic Interventions/Progress Updates:     Patient in seated in TIS w/c at the nurses station upon PT arrival. Patient alert and agreeable to PT session. Patient denied pain during session.  Therapeutic Activity: Bed Mobility: Patient performed sit to supine with supervision in a flat bed without use of bed rails. Provided verbal cues for initiation and visual target to bring her legs onto the bed. Transfers: Patient performed sit to/from stand x1 at the ADL kitchen counter with min A-CGA, patient pulling up on the counter to come to standing, and stand pivot TIS w/c>bed with min/mod. Provided verbal cues for initiation and hip extension during transfers.  Wheelchair Mobility:  Patient was transported in the w/c with total A throughout session for energy conservation and time management.  Neuromuscular Re-ed: Patient performed the following functional/automatic activities for improved balance, posture, and motor control of trunk and upper extremities: -standing at ADL kitchen counter >8 min the patient opened/closed cabinets, retrieved specific items from the cabinets named by PT and returned them with >75% accuracy an with min-mod assist for safety with glassware items; performed side stepping at counter to reach other cabinets for more items and to reach fruit bow; patient named 4/10 fruits without cues within 2-3 trials and able to repeat 3 others once PT said the name, she then perseverated on the word "cantelope" for the remaining fruit and when asked to identify previously named fruits  Patient in bed at end of session with breaks locked, bed and posey alarm set,  and all needs within reach.    Therapy Documentation Precautions:  Precautions Precautions: Fall Precaution Comments: bedrails padded Restrictions Weight Bearing Restrictions: No   Therapy/Group: Individual Therapy  Surina Storts L Juandiego Kolenovic PT, DPT  10/24/2020, 9:08 PM

## 2020-10-24 NOTE — Progress Notes (Signed)
Occupational Therapy Weekly Progress Note  Patient Details  Name: Cindy [name] MRN: [id number] Date of Birth: 09-11-1960  Beginning of progress report period: October 17, 2020 End of progress report period: October 24, 2020  Today's Date: 10/24/2020 OT Individual Time: 1140-1200 OT Individual Time Calculation (min): 20 min    Patient continues to progress toward her LTG. Her feeding and grooming goals have been upgraded to min A. She is more consistently alert and able to automatically participate in ADLs. She remains both ataxic and apraxic with her intentional movements and requires multimodal cueing. Short term goals not set due to pending SNF d/c.   Patient continues to demonstrate the following deficits: muscle weakness, ataxia, decreased coordination and decreased motor planning, decreased midline orientation and decreased motor planning, decreased initiation, decreased attention, decreased awareness, decreased problem solving, decreased safety awareness, decreased memory and delayed processing and decreased sitting balance, decreased standing balance, decreased postural control and decreased balance strategies and therefore will continue to benefit from skilled OT intervention to enhance overall performance with BADL.  See Patient's Care Plan for progression toward long term goals.  Patient progressing toward long term goals..  Continue plan of care.  Skilled Therapeutic Interventions/Progress Updates:    Pt received supine with her husband present, TV on very loud. Provided edu re signs of no TV for the pt and impact on restlessness/confusion. He turned it off per OT request. Pt very alert and requiring no cueing for initiation. She completed bed mobility to EOB with min A. She attempted to don pants- poor visual/depth perception making it difficult to locate holes in pants for legs, requiring max A to don overall. She completed sit > stand with mod A to pull up pants. Mod A stand pivot to the TIS  w/c. She completed grooming tasks at the sink with min A overall, improved sequencing and initiation/termination. Pt was taken to the ADL apt where she completed functional reaching into cabinets in standing. This overhead reaching was excellent in improving her hip rotation to neutral and promoting upright posture. She required only min A to stand with her RUE resting on the counter. Pt was left sitting up in the TIS at the nurses desk, chair alarm set.   Therapy Documentation Precautions:  Precautions Precautions: Fall Precaution Comments: bilateral mitts and bedrails padded Restrictions Weight Bearing Restrictions: No  Therapy/Group: Individual Therapy  Crissie Reese 10/24/2020, 6:24 AM

## 2020-10-24 NOTE — Progress Notes (Signed)
Speech Language Pathology Daily Session Note  Patient Details  Name: MAIZEY MENENDEZ MRN: [id number] Date of Birth: [birth year]  Today's Date: 10/24/2020 SLP Individual Time: 0725-0805 SLP Individual Time Calculation (min): 40 min  Short Term Goals: Week 8: STGs=LTGs due to ELOS  Skilled Therapeutic Interventions: Skilled treatment session focused on dysphagia and cognitive goals. Upon arrival, patient was asleep in bed and required mild verbal and tactile cues to rouse. SLP facilitated session by providing tray set up with breakfast meal of Dys. 1 textures with thin liquids. Patient consumed meal without overt s/s of aspiration with Min verbal cues needed for small sips via straw. SLP scooped the food unto the spoon and patient able to bring the spoon to her oral cavity utilizing her RUE. Patient remained alert throughout the session and demonstrated increased verbal expression. Patient was ~75% intelligible with intermittent language of confusion. Patient left upright in bed with alarm on and all needs within reach. Continue with current plan of care.       Pain No/Denies Pain   Therapy/Group: Individual Therapy  Teegan Guinther 10/24/2020, 12:21 PM

## 2020-10-24 NOTE — Progress Notes (Addendum)
Patient ID: Cindy [name], female   DOB: 07-03-1960, 60 y.o.   MRN: [id number]  SW left message for [name]/ Admisisons with [facility] [phone]) to see if there were any updates from corporate office since pt now has a d/c date of 6/10.    SW made efforts to make contact with pt husband [name] [phone]) however no answer. SW will continue to make efforts since he does not have voicemail set up.  *SW spoke with pt husband to inform on above. He states he would like for her to be here in [place]. He states he will take her home. SW encouraged him to go by the nursing home to see if he is able to get a response.   SW spoke with Mike/Administrator with [name] to see if there was anyway to help with assisting with getting an answer from corporate office. Reports will work on this and will have Debbie follow-up with SW.   [name], MSW, LCSWA Office: [phone] Cell: [phone] Fax: [phone]

## 2020-10-25 NOTE — Progress Notes (Signed)
Speech Language Pathology Daily Session Note  Patient Details  Name: Cindy [name] MRN: [id number] Date of Birth: [birth year]  Today's Date: 10/25/2020 SLP Individual Time: 0725-0800 SLP Individual Time Calculation (min): 35 min  Short Term Goals: Week 8: STGs=LTGs due to ELOS  Skilled Therapeutic Interventions: Skilled treatment session focused on dysphagia and cognitive goals. Upon arrival, patient was asleep in bed and required minimal verbal and tactile cues to rouse. SLP facilitated session by providing tray set up with breakfast meal of Dys. 1 textures with thin liquids. Patient consumed meal with overt cough X 1 due to large, sequential sips of thin liquids. Therefore, Min verbal cues were provided for utilization of small sips via straw throughout the remainder of the meal. No further overt s/s of aspiration were noted. Recommend patient continue current diet. SLP scooped the food unto the spoon and patient was able to bring the spoon to her oral cavity utilizing her RUE. Patient remained alert throughout the session and demonstrated increased verbal expression. Patient was ~90% intelligible with intermittent language of confusion. Patient left upright in bed with alarm on and all needs within reach. Continue with current plan of care.       Pain Pain Assessment Pain Scale: 0-10 Pain Score: 0-No pain  Therapy/Group: Individual Therapy  Cindy [name] 10/25/2020, 10:59 AM

## 2020-10-25 NOTE — Progress Notes (Addendum)
Patient ID: Cindy [name], female   DOB: Dec 03, 1960, 60 y.o.   MRN: [id number]  SW spoke with Cindy [name] with [facility] [phone]) to get updates on if pt will be accepted. Reports corporate has not made a decision at this time, and she will continue to pursue SNF placement for her even when she returns home.   SW spoke with pt husband Cindy [name] [phone]) to inform on above, and will need him to come in for family education. SW asked pt husband to be here at 8am tomorrow. Husband intends to be here.   SW updated Maci[facility] [phone]) to inform on above, and asked them to continue to check in on her.   SW faxed [facility] referral to [facility] (p:[phone]/f:[phone]) and waiting on follow-up.   SW sent out referral to following HHAs and pt denied or pending: [facility]- denied as does not accept St[facility]- declined [facility]-denied as at capacity [facility]  *SW ordered DME: hospital bed, and slide board. SW spoke with pt husband about DME recommended. Reports he has a w/c, however wanted SW to order another one since this was given to him and it is a larger chair. SW shared can order w/c but there will be co-pays for this item. He opted to not have. He has a TTB, BSC, and RW already. SW reminded him to be here for family edu tomorrow.   [name], MSW, LCSWA Office: [phone] Cell: [phone] Fax: [phone]

## 2020-10-25 NOTE — Progress Notes (Signed)
Physical Therapy Discharge Summary  Patient Details  Name: Cindy [name] MRN: [id number] Date of Birth: 01-Dec-1960  Today's Date: 10/27/2020 PT Individual Time: 9937-1696 PT Individual Time Calculation (min): 55 min    Patient has met 5 of 6 long term goals due to improved activity tolerance, improved balance, improved postural control, increased strength, ability to compensate for deficits, improved attention, improved awareness, and improved coordination.  Patient to discharge at a wheelchair level [place].   Patient's care partner  was present for limited family training due to missing scheduled time, instructed w/hands on car transfers, discussed bed mobility and basic transfers, states he is able  to provide the necessary physical and cognitive assistance at discharge. On 6/11, therapist continued family training w/emphasis on dressing, bed mobility, bed to/from wc transfers.  car transfer to ensure safety at DC.     Husband states they will be using personal wc at home and declines wc or hospital bed.  Husband states they have ramp at home so entry is not an issue.  Declined ambulance transport  Reasons goals not met: Patient continues to require intermittent min A with dynamic sitting balance due to truncal ataxia and decreased balance strategies.   Recommendation:  Patient will benefit from ongoing skilled PT services in home health setting to continue to advance safe functional mobility, address ongoing impairments in balance, strength, ROM, functional mobility, activity tolerance, postural control, and minimize fall risk.  Equipment: Sliding board.  Husband declined wheelchair and hospital bed due to possible co-pay.  States home has ramped entry.  Reasons for discharge: treatment goals met and discharge from hospital  Patient/family agrees with progress made and goals achieved: Yes Skilled PT Intervention:  Husband arrived today as scheduled for continued family training.   Husband assisted pt w/donning paper scrub pants and demonstrated good carryover from prior OT training session.  No assist required.  Discussed safe guarding w/bed mobility due to ataxia and impulsivity.  Husband assisted pt to sitting and provided good cueing for safety.  Body mechanics are not ideal and will benefit from cont training w/HHPT.  Discussed importance of using gait belt at all times w/all transfers due to high fall risk.  Husband verbalized understanding.  Husband then assisted pt bed to wc squat pivot w/fair technique, transfer is safe for pt but husband body mechanics need further instruction as he tends to lift more than assist.  Pt transported to hospital entry.  Discussed safe technique w/transfer to car and instructed husband w/positioning of seat to facilitate safe transfer.  stand pivot transfer wc to car w/mod assist of 1.  Husband safely secured pt w/seatbelt.  IPR PT services Dcd at this time.    PT Discharge Precautions/Restrictions Precautions Precautions: Fall Restrictions Weight Bearing Restrictions: No Vision/Perception  Vision - Assessment Eye Alignment: Within Functional Limits Ocular Range of Motion: Within Functional Limits Alignment/Gaze Preference: Chin down Perception Perception: Impaired Praxis Praxis: Impaired Praxis Impairment Details: Ideomotor;Motor planning;Perseveration;Initiation  Cognition   Sensation Sensation Light Touch: Impaired Detail Light Touch Impaired Details: Impaired RUE;Impaired RLE Coordination Gross Motor Movements are Fluid and Coordinated: No Fine Motor Movements are Fluid and Coordinated: No Coordination and Movement Description: ataxic with generalized weakness and mild R hemi Motor  Motor Motor: Motor apraxia;Ataxia;Abnormal tone;Hemiplegia;Abnormal postural alignment and control;Motor perseverations  Mobility Bed Mobility Bed Mobility: Rolling Right;Rolling Left;Supine to Sit;Sit to Supine Rolling Right:  Supervision/verbal cueing (with use of bed rail) Rolling Left: Supervision/Verbal cueing (with use of bed rail) Supine to Sit:  Minimal Assistance - Patient > 75% Sitting - Scoot to Edge of Bed: Minimal Assistance - Patient > 75% Sit to Supine: Minimal Assistance - Patient > 75% Scooting to [facility]: Minimal Assistance - Patient > 75%;Supervision/Verbal Cueing Transfers Transfers: Sit to Stand;Stand to Sit;Stand Pivot Transfers;Squat Pivot Transfers Sit to Stand: Moderate Assistance - Patient 50-74%;Minimal Assistance - Patient > 75% Stand to Sit: Moderate Assistance - Patient 50-74%;Minimal Assistance - Patient > 75% Stand Pivot Transfers: Moderate Assistance - Patient 50 - 74% Squat Pivot Transfers: Minimal Assistance - Patient > 75% Locomotion  Gait Ambulation: Yes Gait Assistance: Maximal Assistance - Patient 25-49%;Moderate Assistance - Patient 50-74% Gait Distance (Feet): 50 Feet Assistive device: Rolling walker Gait Assistance Details: Manual facilitation for weight shifting;Verbal cues for gait pattern;Verbal cues for safe use of DME/AE;Visual cues/gestures for precautions/safety;Verbal cues for technique;Verbal cues for precautions/safety Gait Gait: Yes Gait Pattern: Trunk flexed;Narrow base of support;Scissoring;Ataxic;Left foot flat;Right foot flat;Right genu recurvatum;Left genu recurvatum;Decreased hip/knee flexion - left;Decreased hip/knee flexion - right;Step-through pattern;Decreased step length - right;Decreased step length - left;Decreased stance time - right;Shuffle Gait velocity: decreased Stairs / Additional Locomotion Stairs: Yes Stairs Assistance: Maximal Assistance - Patient 25 - 49% Stair Management Technique: Two rails Number of Stairs: 4 Height of Stairs: 6 Wheelchair Mobility Wheelchair Mobility: No  Trunk/Postural Assessment  Cervical Assessment Cervical Assessment: Exceptions to [facility] (significant forward head posture with limited cervical extension) Thoracic  Assessment Thoracic Assessment: Exceptions to [facility] (kyphosis with limited thoracic extension) Lumbar Assessment Lumbar Assessment: Exceptions to [facility] (posterior pelvic tilt) Postural Control Postural Control: Deficits on evaluation (truncal ataxia with decreased/delayed righting reactions)  Balance Balance Balance Assessed: Yes Static Sitting Balance Static Sitting - Balance Support: Bilateral upper extremity supported Static Sitting - Level of Assistance: 5: Stand by assistance Dynamic Sitting Balance Dynamic Sitting - Balance Support: Right upper extremity supported;Left upper extremity supported;Feet supported;During functional activity Dynamic Sitting - Level of Assistance: 4: Min assist Static Standing Balance Static Standing - Balance Support: Right upper extremity supported;Left upper extremity supported;During functional activity Static Standing - Level of Assistance: 4: Min assist;3: Mod assist Dynamic Standing Balance Dynamic Standing - Balance Support: Right upper extremity supported;Left upper extremity supported Dynamic Standing - Level of Assistance: 3: Mod assist Extremity Assessment      RLE Assessment RLE Assessment: Exceptions to [facility] Passive Range of Motion (PROM) Comments: reduced DF to neutral General Strength Comments: Grossly 3+/5 throughout, unable to formally test due to cognitive deficits LLE Assessment LLE Assessment: Exceptions to [facility] Passive Range of Motion (PROM) Comments: reduced DF to neutral General Strength Comments: Grossly 3+/5 throughout, unable to formally test due to cognitive deficits   Callie Fielding, PT  Doreene Burke PT, DPT  10/25/2020, 4:36 PM

## 2020-10-25 NOTE — Progress Notes (Signed)
Occupational Therapy Session Note  Patient Details  Name: Cindy [name] MRN: [id number] Date of Birth: 05/01/1961  Today's Date: 10/25/2020 OT Individual Time: 1003-1043 OT Individual Time Calculation (min): 40 min    Short Term Goals: Week 1:  OT Short Term Goal 1 (Week 1): Pt will demonstrate improved attention to grooming task for >1 min with mod A. OT Short Term Goal 1 - Progress (Week 1): Not met OT Short Term Goal 2 (Week 1): Pt will tolerate sitting EOB with mod A for >2 min. OT Short Term Goal 2 - Progress (Week 1): Met OT Short Term Goal 3 (Week 1): Pt will don shirt max A. OT Short Term Goal 3 - Progress (Week 1): Not met OT Short Term Goal 4 (Week 1): Pt will self-feed 25% of meal with mod A. OT Short Term Goal 4 - Progress (Week 1): Not met  Skilled Therapeutic Interventions/Progress Updates:     Pt received in bed with no inipcation of pain during session.   ADL:   Pt completes UB dressing with supervision seated in w/c Pt completes LB dressing with CGA at bed level in supported long sitting with HOB elevated to reach feet Pt completes footwear with supervision seated in w/c Pt completes grooming seated in w/c at sink for face washing with pt demo poor depth perception reaching for items at sink   Therapeutic activity Pt completes 3 sit tto stands at high low table with min-MOD A overall and MIN A for standing blaance with unilateral support on table. Pt completes overhead reaching to facilitate improved posture for clothes pins above head height with HOH A to understand activity during first round and mAX cuing.  Pt left at end of session in TIS at RN station with exit alarm on, call light in reach and all needs met   Therapy Documentation Precautions:  Precautions Precautions: Fall Precaution Comments: bedrails padded Restrictions Weight Bearing Restrictions: No General:   Vital Signs: Therapy Vitals Temp: 98 F (36.7 C) Pulse Rate: 68 Resp: 19 BP:  140/78 Patient Position (if appropriate): Lying Oxygen Therapy SpO2: 90 % Pain:   ADL: ADL Eating: Unable to assess Grooming: Unable to assess Upper Body Bathing: Unable to assess Lower Body Bathing: Unable to assess Upper Body Dressing: Unable to assess Lower Body Dressing: Dependent Where Assessed-Lower Body Dressing: Bed level Toileting: Unable to assess Toilet Transfer: Unable to assess Tub/Shower Transfer: Unable to assess Walk-In Shower Transfer: Unable to assess Vision   Perception    Praxis   Exercises:   Other Treatments:     Therapy/Group: Individual Therapy   M  10/25/2020, 6:55 AM 

## 2020-10-25 NOTE — Progress Notes (Signed)
Nutrition Follow-up  DOCUMENTATION CODES:   Non-severe (moderate) malnutrition in context of chronic illness  INTERVENTION: Continue Ensure Enlive po TID, each supplement provides 350 kcal and 20 grams of protein.  Encourage adequate PO intake.   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 Cranial CT/MRI scan was performed and was negative for acute changes but did show remote lacunar infarct. EEG suggestive of cortical dysfunction arising from left hemisphere likely 2/2 underlying structural abnormality post ictal state no seizure or definite epileptiform discharges. Admitted to CIR 4/15.  Pt continues on a dysphagia 1 diet with thin liquids. Meal completion has bene 75-100%. Pt currently has Ensure ordered and has been consuming them. RD to continue with current orders to aid in caloric and protein needs. Labs and medications reviewed. Discharge date 6/10.   Diet Order:   Diet Order             DIET - DYS 1 Room service appropriate? Yes; Fluid consistency: Thin  Diet effective now                   EDUCATION NEEDS:   Not appropriate for education at this time  Skin:  Skin Assessment: Reviewed RN Assessment  Last BM:  6/8  Height:   Ht Readings from Last 1 Encounters:  09/03/20 5' 7"  (1.702 m)    Weight:    Wt Readings from Last 1 Encounters:  10/25/20 58.2 kg   BMI:  Body mass index is 20.1 kg/m.  Estimated Nutritional Needs:   Kcal:  1800-2000  Protein:  90-100 grams  Fluid:  >1.8L  Corrin Parker, MS, RD, LDN RD pager number/after hours weekend pager number on Amion.

## 2020-10-25 NOTE — Progress Notes (Signed)
Physical Therapy Session Note  Patient Details  Name: Cindy [name] MRN: [id number] Date of Birth: 05/03/1961  Today's Date: 10/25/2020 PT Individual Time: 1300-1345 PT Individual Time Calculation (min): 45 min   Short Term Goals: Week 7:  PT Short Term Goal 1 (Week 7): STG=LTG, patient is pending SNF placemet.  Skilled Therapeutic Interventions/Progress Updates:     Patient in TIS w/c in the room with seat belt alarm set upon PT arrival. Patient alert and agreeable to PT session. Patient denied pain during session, requesting to go to the bathroom at beginning of session.  Therapeutic Activity: Bed Mobility: Patient performed sit to supine with min a for lower extremity management due to fatigue with bed flat and without use of bed rails. Provided verbal cues for initiation and hand over hand assist for limb placement due to decreased motor planning. Patient then scooted up in the bed with supervision x3 and min A to adjust to the middle of the bed.  Transfers: Patient performed squat pivot TIS w/c<>BSC over the toilet. Patient was incontinent and continent of bowl during toileting, required min A for sitting balance on BSC due to straining, provided cues for diaphragmatic breathing. Patient required total A for peri-care and max A for lower body clothing management using grab bar for balance in standing. She performed sit to/from stand x1 pulling up on a R rail and x1 using RW from T/S w/c with min A. Provided verbal cues for initiation with 1-2-3 count and forward weight shift. Patient performed a simulated sedan height car transfer with mod A performing squat pivot. Provided cues for safe technique, patient able to bring her legs in/out of the car with mod cues and supervision for assist level.  Gait Training:  Patient ambulated 50 feet using RW with mod-max A with patient's hips supported by therapist's thigh. Ambulated with increased forward trunk flexion with head looking down, alternating  shuffling versus ataxic gait pattern with narrow BOS and increased PF in stance bilaterally. Provided multimodal cues for erect posture, looking ahead, and increased step length and height. Facilitated weight shifting throughout.  Patient ascended/descended 4x6" steps using B rails with mod-max A. Performed reciprocal gait pattern while ascending and step-to gait pattern while descending automatically. Provided cues for technique, safety, and sequencing.   Wheelchair Mobility:  Patient was transported in the w/c with total A throughout session for energy conservation and time management.  Patient in bed at end of session with breaks locked, bed and Posey belt alarm set, and all needs within reach.   Therapy Documentation Precautions:  Precautions Precautions: Fall Precaution Comments: bedrails padded Restrictions Weight Bearing Restrictions: No   Therapy/Group: Individual Therapy  Katerra Ingman L Latyra Jaye PT, DPT  10/25/2020, 6:09 AM

## 2020-10-26 ENCOUNTER — Other Ambulatory Visit (HOSPITAL_COMMUNITY): Payer: Self-pay

## 2020-10-26 MED ORDER — PANTOPRAZOLE SODIUM 40 MG PO TBEC
40.0000 mg | DELAYED_RELEASE_TABLET | Freq: Every day | ORAL | 0 refills | Status: DC
  Filled 2020-10-26: qty 30, 30d supply, fill #0

## 2020-10-26 MED ORDER — OLANZAPINE 2.5 MG PO TABS
ORAL_TABLET | ORAL | 0 refills | Status: DC
  Filled 2020-10-26: qty 77, 25d supply, fill #0

## 2020-10-26 MED ORDER — FOLIC ACID 1 MG PO TABS
1.0000 mg | ORAL_TABLET | Freq: Every day | ORAL | 1 refills | Status: AC
  Filled 2020-10-26: qty 30, 30d supply, fill #0

## 2020-10-26 MED ORDER — AMLODIPINE BESYLATE 10 MG PO TABS
10.0000 mg | ORAL_TABLET | Freq: Every day | ORAL | 0 refills | Status: DC
  Filled 2020-10-26: qty 30, 30d supply, fill #0

## 2020-10-26 MED ORDER — ATORVASTATIN CALCIUM 40 MG PO TABS
40.0000 mg | ORAL_TABLET | Freq: Every day | ORAL | 1 refills | Status: AC
  Filled 2020-10-26: qty 30, 30d supply, fill #0

## 2020-10-26 MED ORDER — DIVALPROEX SODIUM 500 MG PO DR TAB
500.0000 mg | DELAYED_RELEASE_TABLET | Freq: Three times a day (TID) | ORAL | 0 refills | Status: DC
  Filled 2020-10-26: qty 90, 30d supply, fill #0

## 2020-10-26 MED ORDER — OLANZAPINE 5 MG PO TBDP
5.0000 mg | ORAL_TABLET | Freq: Every day | ORAL | 0 refills | Status: DC
  Filled 2020-10-26: qty 30, 30d supply, fill #0

## 2020-10-26 MED ORDER — LACOSAMIDE 200 MG PO TABS
200.0000 mg | ORAL_TABLET | Freq: Two times a day (BID) | ORAL | 1 refills | Status: DC
  Filled 2020-10-26: qty 60, 30d supply, fill #0

## 2020-10-26 MED ORDER — MELATONIN 3 MG PO TABS
3.0000 mg | ORAL_TABLET | Freq: Every day | ORAL | 0 refills | Status: DC
  Filled 2020-10-26: qty 30, 30d supply, fill #0

## 2020-10-26 NOTE — Progress Notes (Signed)
Inpatient Rehabilitation Care Coordinator Discharge Note  The overall goal for the admission was met for:   Discharge location: Yes. D/c to home with 24/7 care from husband.   Length of Stay: Yes. 56 days.   Discharge activity level: Yes. MinA-Mod A; Max A with stairs  Home/community participation: Yes.Limited.   Services provided included: MD, RD, PT, OT, SLP, RN, CM, TR, Pharmacy, Neuropsych, and SW  Financial Services: Medicaid  Choices offered to/list presented to:Yes  Follow-up services arranged: Home Health: [facility] for HHPT/SN/aide/SW and DME: [facility] for hospital bed and slide board  Comments (or additional information):  PCS with [employer]- Approved for 60hrs of PCS services per month. Process takes 30-45 days until a homecare agency has been established to provide care. Homecare agencies that will be contacted in order: 1) [facility], 2) [employer], and [employer] *hours may be adjusted once nursing completes their assessment.*  Patient/Family verbalized understanding of follow-up arrangements: Yes  Individual responsible for coordination of the follow-up plan: Contact pt husband [name] [phone]  Confirmed correct DME delivered: Rana Snare 10/26/2020    Rana Snare

## 2020-10-26 NOTE — Progress Notes (Signed)
Patient ID: NOVELLA ABRAHA, female   DOB: 1960/05/28, 60 y.o.   MRN: [id number]  SW followed up with Ivin Booty Hendricks/[facility] (p:[phone]/f:[phone]) and stated they have to rerun her insurance since the system was down. Will follow-up once there is an answer.  SW faxed PCS referral to [employer] [phone]). *Referral accepted by Holly/RN for 60 hours. Approved for 60hrs of PCS services per month. Process takes 30-45 days until a homecare agency has been established to provide care. Homecare agencies that will be contacted in order: 1) [facility], 2) [employer], and [employer] *hours may be adjusted once nursing completes their assessment.*  *SW spoke with Sharon/[facility] who reported referral accepted. States will need order and F2F. SW faxed forms and emailed forms ([email or web address]).  SW met with pt husband to review above about HHA, PCS, making contact with DME company (copay for hospital bed), and ambulance transport to the home tomorrow. SW left discharge packet at charge RN station for tomorrow transport. Will need to call and schedule transportation with [facility] ([phone]). Please check with charge RN to make sure this has not been completed.   SW sent out referral to following HHAs and pt denied or pending: Cory/[facility]- denied as does not accept St[facility]yn/[facility] Amy/[facility]- declined Angela/[facility]-denied as at [place]- denied as not in [place] (bought out by [facility])  Loralee Pacas, MSW, [place] Office: [phone] Cell: [phone] Fax: [phone]

## 2020-10-26 NOTE — Progress Notes (Signed)
Occupational Therapy Discharge Summary  Patient Details  Name: Cindy [name] MRN: [id number] Date of Birth: [birth year]  Today's Date: 10/26/2020 OT Individual Time: 1115-1200 OT Individual Time Calculation (min): 51 min   Session Family Ed: Pt and husband present for family education. OT reviewed bed level bathing, dressing, toileting/bed pan placement, bed mobility, body mechanics, raising bed to hip height, dressing strategies- bridging hips for LB dressing/A for orientation of clothing, cuing for sequencing/cognition, ataxia, self feeding assistance, and decreased sensation. Pt husband declines hands on practice throughout session despite encouragement, but takes notes. Pt able to completes BADL with MIN A and many grip slips with items d/t decreased sensation in RUE and ataxia. Pt requesting to get OOB for lunch, and OT encouraged husband to practice SB transfer, but he declines stating, "im going to get the house ready." MOD A SPT to TIS, pt at RN station at end of session with belt alarm on.    Patient has met 5 of 5 long term goals due to improved activity tolerance, improved balance, postural control, ability to compensate for deficits, functional use of  RIGHT upper, RIGHT lower, LEFT upper, and LEFT lower extremity, improved attention, improved awareness, and improved coordination.  Patient to discharge at [facility] Assist bed level.  Patient's care partner  verbalized understanding strategies and took notes, however declined hands on practice of bed level ADLs despited encouragement and education  to provide the necessary physical and cognitive assistance at discharge. Pt has made slow progress throughout her stay while requiring +2 total A at eval and MIN A bed level for mobility/toileting, pt contineus to demo profound cognitive/motor/visual-perceptual deficits. Pt husband will take pt home at a bed level and toileting is recommended at a bed level d/t ataxia/trunk control deficits. Pt can  bathe at a MOD A level, dress at MIN A level in supported long sitting and groom with Supervision/cuing.    Reasons goals not met: n/a  Recommendation:  Patient will benefit from ongoing skilled OT services in home health setting to continue to advance functional skills in the area of BADL and Reduce care partner burden.  Equipment: No equipment provided  Reasons for discharge: treatment goals met and discharge from hospital  Patient/family agrees with progress made and goals achieved: Yes  OT Discharge Precautions/Restrictions  Precautions Precautions: Fall Precaution Comments: bedrails padded Restrictions Weight Bearing Restrictions: No   Pain Pain Assessment Pain Scale: 0-10 Pain Score: 0-No pain ADL ADL Eating: Unable to assess Grooming: Unable to assess Upper Body Bathing: Unable to assess Lower Body Bathing: Unable to assess Upper Body Dressing: Unable to assess Lower Body Dressing: Dependent Where Assessed-Lower Body Dressing: Bed level Toileting: Unable to assess Toilet Transfer: Unable to assess Tub/Shower Transfer: Unable to assess Social research officer, government: Unable to assess Vision/Perception  Vision - Assessment Eye Alignment: Within Functional Limits Ocular Range of Motion: Within Functional Limits Alignment/Gaze Preference: Chin down Perception Perception: Impaired Praxis Praxis: Impaired Praxis Impairment Details: Ideomotor;Motor planning;Perseveration;Initiation   Sensation Sensation Light Touch: Impaired Detail Light Touch Impaired Details: Impaired RUE;Impaired RLE Coordination Gross Motor Movements are Fluid and Coordinated: No Fine Motor Movements are Fluid and Coordinated: No Coordination and Movement Description: ataxic with generalized weakness and mild R hemi Motor  Motor Motor: Motor apraxia;Ataxia;Abnormal tone;Hemiplegia;Abnormal postural alignment and control;Motor perseverations  Mobility Bed Mobility Bed Mobility: Rolling  Right;Rolling Left;Supine to Sit;Sit to Supine Rolling Right: Supervision/verbal cueing (with use of bed rail) Rolling Left: Supervision/Verbal cueing (with use of bed rail) Supine to  Sit: Minimal Assistance - Patient > 75% Sitting - Scoot to Edge of Bed: Minimal Assistance - Patient > 75% Sit to Supine: Minimal Assistance - Patient > 75% Scooting to HOB: Minimal Assistance - Patient > 75%;Supervision/Verbal Cueing Transfers Transfers: Sit to Stand;Stand to Sit;Stand Pivot Transfers;Squat Pivot Transfers Sit to Stand: Moderate Assistance - Patient 50-74%;Minimal Assistance - Patient > 75% Stand to Sit: Moderate Assistance - Patient 50-74%;Minimal Assistance - Patient > 75% Stand Pivot Transfers: Moderate Assistance - Patient 50 - 74% Squat Pivot Transfers: Minimal Assistance - Patient > 75% Trunk/Postural Assessment  Cervical Assessment Cervical Assessment: Exceptions to [facility] (significant forward head posture with limited cervical extension) Thoracic Assessment Thoracic Assessment: Exceptions to [facility] (kyphosis with limited thoracic extension) Lumbar Assessment Lumbar Assessment: Exceptions to [facility] (posterior pelvic tilt) Postural Control Postural Control: Deficits on evaluation (truncal ataxia with decreased/delayed righting reactions)  Balance Balance Balance Assessed: Yes Static Sitting Balance Static Sitting - Balance Support: Bilateral upper extremity supported Static Sitting - Level of Assistance: 5: Stand by assistance Dynamic Sitting Balance Dynamic Sitting - Balance Support: Right upper extremity supported;Left upper extremity supported;Feet supported;During functional activity Dynamic Sitting - Level of Assistance: 4: Min assist Static Standing Balance Static Standing - Balance Support: Right upper extremity supported;Left upper extremity supported;During functional activity Static Standing - Level of Assistance: 4: Min assist;3: Mod assist Dynamic Standing Balance Dynamic  Standing - Balance Support: Right upper extremity supported;Left upper extremity supported Dynamic Standing - Level of Assistance: 3: Mod assist  Cognition Overall Cognitive Status: Impaired/Different from baseline Arousal/Alertness: Awake/alert Orientation Level: Oriented to place;Oriented to person;Disoriented to time;Disoriented to situation Attention: Sustained Focused Attention: Appears intact Sustained Attention: Impaired Sustained Attention Impairment: Verbal basic;Functional basic Memory Impairment: Storage deficit;Decreased long term memory;Decreased short term memory Decreased Long Term Memory: Verbal basic;Functional basic Decreased Short Term Memory: Verbal basic;Functional basic Awareness: Impaired Awareness Impairment: Intellectual impairment Problem Solving: Impaired Problem Solving Impairment: Verbal basic;Functional basic Executive Function:  (all impaired due to lower level deficits) Behaviors: Restless;Impulsive;Poor frustration tolerance;Perseveration Safety/Judgment: Impaired   Extremity/Trunk Assessment RUE Assessment General Strength Comments: Pt reported decreased sensaiton- full ROM /strength but motor/sensory defiicts impact funcitonal use LUE Assessment General Strength Comments: full ROM /strength but motor/sensory defiicts impact funcitonal use   Tonny Branch 10/26/2020, 8:33 AM

## 2020-10-26 NOTE — Progress Notes (Signed)
PROGRESS NOTE   Subjective/Complaints: No new issues. A little restless in bed this morning  ROS: Limited due to cognitive/behavioral   Objective:   No results found. No results for input(s): WBC, HGB, HCT, PLT in the last 72 hours. No results for input(s): NA, K, CL, CO2, GLUCOSE, BUN, CREATININE, CALCIUM in the last 72 hours.   Intake/Output Summary (Last 24 hours) at 10/26/2020 1158 Last data filed at 10/26/2020 0839 Gross per 24 hour  Intake 180 ml  Output --  Net 180 ml        Physical Exam: Vital Signs Blood pressure (!) 154/81, pulse 70, temperature 98 F (36.7 C), temperature source Oral, resp. rate 20, height 5\' 7"  (1.702 m), weight 54.5 kg, SpO2 100 %.  Constitutional: No distress . Vital signs reviewed. HEENT: EOMI, oral membranes moist Neck: supple Cardiovascular: RRR without murmur. No JVD    Respiratory/Chest: CTA Bilaterally without wheezes or rales. Normal effort    GI/Abdomen: BS +, non-tender, non-distended Ext: no clubbing, cyanosis, or edema Psych: pleasant and cooperative Musc: No edema in extremities.  No tenderness in extremities. Neuro: Alert, confused, follows basic commands, moves all 4's. Focuses better    Assessment/Plan: 1. Functional deficits which require 3+ hours per day of interdisciplinary therapy in a comprehensive inpatient rehab setting. Physiatrist is providing close team supervision and 24 hour management of active medical problems listed below. Physiatrist and rehab team continue to assess barriers to discharge/monitor patient progress toward functional and medical goals  Care Tool:  Bathing  Bathing activity did not occur: Safety/medical concerns Body parts bathed by patient: Right arm, Left arm, Chest, Abdomen, Front perineal area, Right upper leg, Left upper leg, Face   Body parts bathed by helper: Buttocks, Right lower leg, Left lower leg Body parts n/a: Left  lower leg, Left upper leg, Right upper leg, Right lower leg, Buttocks, Abdomen, Front perineal area, Left arm, Right arm, Chest   Bathing assist Assist Level: Moderate Assistance - Patient 50 - 74%     Upper Body Dressing/Undressing Upper body dressing Upper body dressing/undressing activity did not occur (including orthotics): Safety/medical concerns What is the patient wearing?: Pull over shirt    Upper body assist Assist Level: Supervision/Verbal cueing    Lower Body Dressing/Undressing Lower body dressing      What is the patient wearing?: Pants     Lower body assist Assist for lower body dressing: Contact Guard/Touching assist (bed level bridging)     Toileting Toileting Toileting Activity did not occur (Clothing management and hygiene only): N/A (no void or bm)  Toileting assist Assist for toileting: Total Assistance - Patient < 25%     Transfers Chair/bed transfer  Transfers assist  Chair/bed transfer activity did not occur: Safety/medical concerns  Chair/bed transfer assist level: Moderate Assistance - Patient 50 - 74%     Locomotion Ambulation   Ambulation assist   Ambulation activity did not occur: Safety/medical concerns  Assist level: Maximal Assistance - Patient 25 - 49% Assistive device: Walker-rolling Max distance: 50 ft   Walk 10 feet activity   Assist  Walk 10 feet activity did not occur: Safety/medical concerns  Assist level: Moderate Assistance -  Patient - 50 - 74% Assistive device: Walker-rolling   Walk 50 feet activity   Assist Walk 50 feet with 2 turns activity did not occur: Safety/medical concerns  Assist level: Maximal Assistance - Patient 25 - 49% Assistive device: Walker-rolling    Walk 150 feet activity   Assist Walk 150 feet activity did not occur: Safety/medical concerns (decreased strength/activity tolerance)         Walk 10 feet on uneven surface  activity   Assist Walk 10 feet on uneven surfaces activity  did not occur: Safety/medical concerns         Wheelchair     Assist Will patient use wheelchair at discharge?: Yes Type of Wheelchair: Manual Wheelchair activity did not occur: Safety/medical concerns  Wheelchair assist level: Dependent - Patient 0% Max wheelchair distance: >150 ft    Wheelchair 50 feet with 2 turns activity    Assist    Wheelchair 50 feet with 2 turns activity did not occur: Safety/medical concerns   Assist Level: Dependent - Patient 0%   Wheelchair 150 feet activity     Assist  Wheelchair 150 feet activity did not occur: Safety/medical concerns   Assist Level: Dependent - Patient 0%   Blood pressure (!) 154/81, pulse 70, temperature 98 F (36.7 C), temperature source Oral, resp. rate 20, height 5\' 7"  (1.702 m), weight 54.5 kg, SpO2 100 %.  Medical Problem List and Plan: 1.  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             -pt now going home with husband tomorrow 6/11 as no SNF bed available -husband education/dc orders today     QD therapies   2.  Impaired mobility -DVT/anticoagulation: Continue Lovenox             -antiplatelet therapy: Aspirin 325 mg 3. Pain Management: Continue Tylenol as needed 4. Agitation/psychosis: Antipsychotic: Continue zyprexa 5mg  hs and 2.5mg  daily  - Continue bilateral mittens as needed  -depakote 500mg  tid  -melatonin 3mg  HS    -limit prn haldol    -maintain current doses of meds as these seem to be working for her 5. Neuropsych: This patient is capable of making decisions on her own behalf. 6. Skin/Wound Care: Routine skin checks 7. Fluids/Electrolytes/Nutrition:    6/6 BUN still elevated today but stable from last week   -continue to encourage po Filed Weights   10/24/20 0525 10/25/20 0458 10/26/20 0445  Weight: 59.3 kg 58.2 kg 54.5 kg    Improving 6/10 8.  Seizure disorder.  Vimpat 200 mg  twice daily, valproic acid 1000 mg twice daily.  EEG negative  No breakthrough seizures during this admit 9.  Hypertension.  Norvasc 10 mg daily     Continue lopressor 25mg  bid Vitals:   10/25/20 2044 10/26/20 0548  BP: 120/74 (!) 154/81  Pulse: 79 70  Resp: 18 20  Temp: 98.4 F (36.9 C) 98 F (36.7 C)  SpO2: 100% 100%  bordereline controlled 6/10 10.  History of bilateral cerebral convexity infarction frontal region.  CIR 08/19/2019-08/27/2019 11.  Hyperlipidemia.  Continue Lipitor 12.  History of polysubstance abuse.  Urine drug screen positive cocaine.   13. AKI:   Creatinine 1.18---stable 14. UTI: UCX never performed.     -f/u urine cx negative 15. Dysphagia:     -D1, thins  -Tolerating, Intake has been more consistent, gaining weight  LOS: 56 days A FACE TO FACE  EVALUATION WAS PERFORMED  Ranelle Oyster 10/26/2020, 11:58 AM

## 2020-10-26 NOTE — Progress Notes (Signed)
Speech Language Pathology Discharge Summary  Patient Details  Name: Cindy [name] MRN: [id number] Date of Birth: [birth year]  Today's Date: 10/26/2020 SLP Individual Time: 1000-1035 SLP Individual Time Calculation (min): 35 min   Skilled Therapeutic Interventions:  Skilled treatment session focused on completion of family education the patient's husband. SLP provided education regarding patient's current swallowing function, diet recommendations, appropriate textures, swallowing compensatory strategies and medication administration. Patient's husband verbalized understanding, recorded his own noted and SLP provided handouts. SLP also provided education regarding importance of 24 hour supervision to maximize safety and ensure safe decision making. SLP also helped family generate a list of basic and functional activities the patient can participate in at home to maximize cognitive engagement and remediation. He verbalized understanding. Patient left upright in wheelchair with alarm on and husband present.     Patient has met 6 of 6 long term goals.  Patient to discharge at overall Max level.   Reasons goals not met: N/A   Clinical Impression/Discharge Summary: Patient has made slow but functional gains and has met 6 of 6 LTGs this admission. Currently, patient is consuming Dys. 1 textures with thin liquids with minimal overt s/s of aspiration. Patient demonstrates prolonged AP transit and requires Min-Mod A verbal cues needed for use of small sips via straw. Recommend patient continue current diet at discharge. Patient demonstrates improved speech intelligibility and is ~90% intelligible at the word level and 75% intelligible at the phrase level. Due to improvements in speech intelligibility, patient is able to verbally express her basic wants/needs at the phrase level. Patient also demonstrates improved basic cognitive functioning characterized by improved sustained attention, orientation, initiation  and intellectual awareness. Despite gains, patient will require overall Max-Total A multimodal cues to complete functional and familiar tasks at home with close 24 hour supervision. Patient and family education is complete and patient will discharge home with 24 hour supervision from family. Patient would benefit form f/u SLP services to maximize her functional communication, cognitive functioning and swallowing function in order to reduce caregiver burden.   Care Partner:  Caregiver Able to Provide Assistance: Yes  Type of Caregiver Assistance: Physical;Cognitive  Recommendation:  24 hour supervision/assistance;Home Health SLP  Rationale for SLP Follow Up: Maximize functional communication;Maximize cognitive function and independence;Maximize swallowing safety;Reduce caregiver burden   Equipment: N/A   Reasons for discharge: Discharged from hospital;Treatment goals met   Patient/Family Agrees with Progress Made and Goals Achieved: Yes    [name] 10/26/2020, 6:53 AM

## 2020-10-26 NOTE — Progress Notes (Signed)
Physical Therapy Session Note  Patient Details  Name: Cindy [name] MRN: [id number] Date of Birth: Nov 19, 1960  Today's Date: 10/26/2020 PT Individual Time: 0900-1000 PT Individual Time Calculation (min): 60 min   Short Term Goals: Week 1:  PT Short Term Goal 1 (Week 1): pt to demonstrated supine<>sit max A x1 PT Short Term Goal 1 - Progress (Week 1): Met PT Short Term Goal 2 (Week 1): pt to demonstrate sitting balance 5 min at mod A x1 PT Short Term Goal 2 - Progress (Week 1): Met PT Short Term Goal 3 (Week 1): pt to demonstrate bed<>chair transfers max A x1 PT Short Term Goal 3 - Progress (Week 1): Partly met (inconsistent) PT Short Term Goal 4 (Week 1): pt to tolerate sitting OOB 1 hour for improved participation with therapy PT Short Term Goal 4 - Progress (Week 1): Progressing toward goal PT Short Term Goal 5 (Week 1): Initiate gait training PT Short Term Goal 5 - Progress (Week 1): Met Week 2:  PT Short Term Goal 1 (Week 2): Patient will perform bed mobility with mod A >50% of the time. PT Short Term Goal 1 - Progress (Week 2): Progressing toward goal PT Short Term Goal 2 (Week 2): Patient will perform bed>chair transfers with max A consitently. PT Short Term Goal 2 - Progress (Week 2): Progressing toward goal PT Short Term Goal 3 (Week 2): Patient will perform standing balance >1 min with mod-max A. PT Short Term Goal 3 - Progress (Week 2): Met PT Short Term Goal 4 (Week 2): Patient will ambulate >25 feet with +2 assist. PT Short Term Goal 4 - Progress (Week 2): Met Week 3:  PT Short Term Goal 1 (Week 3): STG=LTG due to ELOS. PT Short Term Goal 1 - Progress (Week 3): Progressing toward goal Week 4:  PT Short Term Goal 1 (Week 4): STG=LTG, patient is pending SNF placemet. PT Short Term Goal 1 - Progress (Week 4): Progressing toward goal  Skilled Therapeutic Interventions/Progress Updates:    Pain:  Pt reports no pain.  Treatment to tolerance.  Rest breaks and repositioning as  needed.  Pt initially supine and agreeable to treatment session w/focus on scheduled family training, ADLs.  Husband not present x 30 min of session, arrived at 9:30 at which time family training began.  Pt initially incontinent in wet brief.   Attempts mult times to sit up against waist belt, despite cues to remain in bed while therapist obtained pants/brief/washcloth.  Total assist to remove brief, performs hygiene w/set up and mult cues.  Clean brief and pants donned w/max assist, max cues for bridging/rolling.  Supine to sit w/min assist, max cues for sequencing.   stand pivot transfer to wc w/mod assist, max cues for sequencing and safety.  Pt transported to gym.  Sit to stand from wc w/mod assist, heavily flexed posture, cues for sequencing, safe hand placement.  Gait x 63f w/RW w/max assist, changed to +2 HHA w/improved posture and ability to sequence/improved forward progression, wc follow x 384f  At this pt, husband arrived for training.   Prioritized car transfer training due to time constraints.  Pt transported to car simulator and therapist demonstrated squat pivot transfer car to/from wc w/emphasis on need for clear simple instructions, need to focus on stability of pts feet w/transfer, and body mechanics.  Pt performs w/mod assist an max cueing for safety.  Attempts to step into car without first sitting but able to redirect pt to safe technique  mid transfer. Pt able to raise feet into and out of car w/supervision. Husband then practiced transfer w/pt.  Performed w/much verbal instruction from therapist to safely use gait belt and for body mechanics.Transfer completed safely w/min assist, cues from therapist. W/repeated practice, pt attempted to step into car mid transfer and husband did not recognize this, mod assist from therpst to pivot pt hips safely into car.  Husband did safely guard pt car to wc squat pivot on final effort w/min cueing only.  Husband fatigued w/this stating his "legs  were giving out".    Pt transported back to room.  Discussed/demonstrated wc to/from bed and current assist required w/bed mobility for safety.  Husband voiced understanding.  Recommend session to continue training 6/11 prior to dc.  Therapy Documentation Precautions:  Precautions Precautions: Fall Precaution Comments: bedrails padded Restrictions Weight Bearing Restrictions: No     Therapy/Group: Individual Therapy Callie Fielding, [place] 10/26/2020, 12:34 PM

## 2020-10-27 NOTE — Progress Notes (Signed)
Pt. Is being discharged home with her husband her medications and belongings were given to him as well. Husband has received training from NT on how to perform diaper changes. At time of discharge [facility] came to transport pt, husband decided not to allow [facility] to do transport and stated " I have someone at home to help get her out of the car". Pt was alert and  in good spirits upon leaving.

## 2020-10-29 ENCOUNTER — Encounter: Payer: Self-pay | Admitting: Registered Nurse

## 2020-11-08 ENCOUNTER — Other Ambulatory Visit (HOSPITAL_COMMUNITY): Payer: Self-pay

## 2020-11-08 ENCOUNTER — Encounter: Payer: Medicaid Other | Attending: Registered Nurse | Admitting: Registered Nurse

## 2020-12-20 ENCOUNTER — Other Ambulatory Visit: Payer: Self-pay | Admitting: Physical Medicine & Rehabilitation

## 2021-01-17 ENCOUNTER — Other Ambulatory Visit: Payer: Self-pay | Admitting: Physical Medicine & Rehabilitation

## 2021-04-01 ENCOUNTER — Other Ambulatory Visit (HOSPITAL_COMMUNITY): Payer: Self-pay | Admitting: Sports Medicine

## 2021-04-01 DIAGNOSIS — Z1231 Encounter for screening mammogram for malignant neoplasm of breast: Secondary | ICD-10-CM

## 2021-04-02 ENCOUNTER — Encounter: Payer: Self-pay | Admitting: Internal Medicine

## 2021-04-10 ENCOUNTER — Ambulatory Visit (HOSPITAL_COMMUNITY): Payer: Medicaid Other

## 2021-05-22 ENCOUNTER — Ambulatory Visit: Payer: Medicaid Other

## 2021-12-23 ENCOUNTER — Other Ambulatory Visit (HOSPITAL_COMMUNITY): Payer: Self-pay | Admitting: Nephrology

## 2021-12-23 ENCOUNTER — Other Ambulatory Visit: Payer: Self-pay | Admitting: Nephrology

## 2021-12-23 DIAGNOSIS — N1832 Chronic kidney disease, stage 3b: Secondary | ICD-10-CM

## 2021-12-23 DIAGNOSIS — D638 Anemia in other chronic diseases classified elsewhere: Secondary | ICD-10-CM

## 2022-04-09 ENCOUNTER — Encounter (INDEPENDENT_AMBULATORY_CARE_PROVIDER_SITE_OTHER): Payer: Self-pay | Admitting: *Deleted

## 2022-04-17 ENCOUNTER — Other Ambulatory Visit (HOSPITAL_COMMUNITY): Payer: Self-pay | Admitting: Family Medicine

## 2022-04-17 DIAGNOSIS — M549 Dorsalgia, unspecified: Secondary | ICD-10-CM

## 2022-04-17 DIAGNOSIS — N1832 Chronic kidney disease, stage 3b: Secondary | ICD-10-CM

## 2022-05-06 ENCOUNTER — Ambulatory Visit
Admission: RE | Admit: 2022-05-06 | Discharge: 2022-05-06 | Disposition: A | Discharge status: Home / Self Care | Payer: Medicaid Other | Source: Ambulatory Visit | Attending: Family Medicine | Admitting: Family Medicine

## 2022-05-06 DIAGNOSIS — N1832 Chronic kidney disease, stage 3b: Secondary | ICD-10-CM | POA: Insufficient documentation

## 2022-05-06 DIAGNOSIS — M549 Dorsalgia, unspecified: Secondary | ICD-10-CM | POA: Diagnosis present

## 2022-06-10 ENCOUNTER — Other Ambulatory Visit: Payer: Self-pay | Admitting: Physical Medicine & Rehabilitation

## 2022-06-23 ENCOUNTER — Ambulatory Visit (INDEPENDENT_AMBULATORY_CARE_PROVIDER_SITE_OTHER): Payer: Medicaid Other | Admitting: Gastroenterology

## 2022-09-22 ENCOUNTER — Ambulatory Visit (INDEPENDENT_AMBULATORY_CARE_PROVIDER_SITE_OTHER): Payer: Medicaid Other | Admitting: Gastroenterology

## 2022-09-29 ENCOUNTER — Other Ambulatory Visit (HOSPITAL_COMMUNITY): Payer: Self-pay | Admitting: Family Medicine

## 2022-09-29 DIAGNOSIS — Z1231 Encounter for screening mammogram for malignant neoplasm of breast: Secondary | ICD-10-CM

## 2022-11-19 ENCOUNTER — Emergency Department (HOSPITAL_COMMUNITY): Payer: Medicaid Other

## 2022-11-19 ENCOUNTER — Encounter (HOSPITAL_COMMUNITY): Payer: Self-pay

## 2022-11-19 ENCOUNTER — Observation Stay (HOSPITAL_COMMUNITY): Payer: Medicaid Other

## 2022-11-19 ENCOUNTER — Inpatient Hospital Stay (HOSPITAL_COMMUNITY)
Admission: EM | Admit: 2022-11-19 | Discharge: 2022-11-23 | DRG: 100 | Disposition: A | Discharge status: Home / Self Care | Payer: Medicaid Other | Attending: Family Medicine | Admitting: Family Medicine

## 2022-11-19 ENCOUNTER — Other Ambulatory Visit: Payer: Self-pay

## 2022-11-19 ENCOUNTER — Observation Stay (HOSPITAL_BASED_OUTPATIENT_CLINIC_OR_DEPARTMENT_OTHER): Payer: Medicaid Other

## 2022-11-19 DIAGNOSIS — R4701 Aphasia: Secondary | ICD-10-CM | POA: Diagnosis present

## 2022-11-19 DIAGNOSIS — R471 Dysarthria and anarthria: Secondary | ICD-10-CM | POA: Diagnosis not present

## 2022-11-19 DIAGNOSIS — I10 Essential (primary) hypertension: Secondary | ICD-10-CM | POA: Diagnosis not present

## 2022-11-19 DIAGNOSIS — G9341 Metabolic encephalopathy: Secondary | ICD-10-CM | POA: Diagnosis present

## 2022-11-19 DIAGNOSIS — I639 Cerebral infarction, unspecified: Secondary | ICD-10-CM | POA: Diagnosis not present

## 2022-11-19 DIAGNOSIS — R531 Weakness: Secondary | ICD-10-CM | POA: Diagnosis present

## 2022-11-19 DIAGNOSIS — Z79899 Other long term (current) drug therapy: Secondary | ICD-10-CM

## 2022-11-19 DIAGNOSIS — Z66 Do not resuscitate: Secondary | ICD-10-CM | POA: Diagnosis present

## 2022-11-19 DIAGNOSIS — Z7982 Long term (current) use of aspirin: Secondary | ICD-10-CM

## 2022-11-19 DIAGNOSIS — G8384 Todd's paralysis (postepileptic): Secondary | ICD-10-CM | POA: Diagnosis present

## 2022-11-19 DIAGNOSIS — Z8249 Family history of ischemic heart disease and other diseases of the circulatory system: Secondary | ICD-10-CM

## 2022-11-19 DIAGNOSIS — Z8673 Personal history of transient ischemic attack (TIA), and cerebral infarction without residual deficits: Secondary | ICD-10-CM

## 2022-11-19 DIAGNOSIS — E119 Type 2 diabetes mellitus without complications: Secondary | ICD-10-CM | POA: Diagnosis present

## 2022-11-19 DIAGNOSIS — G40909 Epilepsy, unspecified, not intractable, without status epilepticus: Secondary | ICD-10-CM | POA: Diagnosis not present

## 2022-11-19 DIAGNOSIS — E7800 Pure hypercholesterolemia, unspecified: Secondary | ICD-10-CM | POA: Diagnosis not present

## 2022-11-19 DIAGNOSIS — R5381 Other malaise: Secondary | ICD-10-CM | POA: Diagnosis present

## 2022-11-19 DIAGNOSIS — R451 Restlessness and agitation: Secondary | ICD-10-CM | POA: Diagnosis present

## 2022-11-19 DIAGNOSIS — R569 Unspecified convulsions: Secondary | ICD-10-CM | POA: Diagnosis not present

## 2022-11-19 HISTORY — DX: Unspecified convulsions: R56.9

## 2022-11-19 LAB — ECHOCARDIOGRAM COMPLETE
AR max vel: 1.79 cm2
AV Area VTI: 2.15 cm2
AV Area mean vel: 2.06 cm2
AV Mean grad: 2.7 mmHg
AV Peak grad: 5.5 mmHg
Ao pk vel: 1.18 m/s
Area-P 1/2: 5.38 cm2
Height: 67 in
S' Lateral: 1.9 cm
Weight: 2529.6 oz

## 2022-11-19 LAB — CBC WITH DIFFERENTIAL/PLATELET
Abs Immature Granulocytes: 0.05 10*3/uL (ref 0.00–0.07)
Basophils Absolute: 0 10*3/uL (ref 0.0–0.1)
Basophils Relative: 0 %
Eosinophils Absolute: 0 10*3/uL (ref 0.0–0.5)
Eosinophils Relative: 0 %
HCT: 34.5 % — ABNORMAL LOW (ref 36.0–46.0)
Hemoglobin: 11.6 g/dL — ABNORMAL LOW (ref 12.0–15.0)
Immature Granulocytes: 1 %
Lymphocytes Relative: 13 %
Lymphs Abs: 1.3 10*3/uL (ref 0.7–4.0)
MCH: 32 pg (ref 26.0–34.0)
MCHC: 33.6 g/dL (ref 30.0–36.0)
MCV: 95 fL (ref 80.0–100.0)
Monocytes Absolute: 0.7 10*3/uL (ref 0.1–1.0)
Monocytes Relative: 8 %
Neutro Abs: 7.6 10*3/uL (ref 1.7–7.7)
Neutrophils Relative %: 78 %
Platelets: 200 10*3/uL (ref 150–400)
RBC: 3.63 MIL/uL — ABNORMAL LOW (ref 3.87–5.11)
RDW: 13.3 % (ref 11.5–15.5)
WBC: 9.7 10*3/uL (ref 4.0–10.5)
nRBC: 0 % (ref 0.0–0.2)

## 2022-11-19 LAB — URINALYSIS, ROUTINE W REFLEX MICROSCOPIC
Bilirubin Urine: NEGATIVE
Glucose, UA: NEGATIVE mg/dL
Hgb urine dipstick: NEGATIVE
Ketones, ur: NEGATIVE mg/dL
Leukocytes,Ua: NEGATIVE
Nitrite: NEGATIVE
Protein, ur: NEGATIVE mg/dL
Specific Gravity, Urine: 1.013 (ref 1.005–1.030)
pH: 8 (ref 5.0–8.0)

## 2022-11-19 LAB — RAPID URINE DRUG SCREEN, HOSP PERFORMED
Amphetamines: NOT DETECTED
Barbiturates: NOT DETECTED
Benzodiazepines: POSITIVE — AB
Cocaine: NOT DETECTED
Opiates: NOT DETECTED
Tetrahydrocannabinol: NOT DETECTED

## 2022-11-19 LAB — COMPREHENSIVE METABOLIC PANEL
ALT: 12 U/L (ref 0–44)
AST: 21 U/L (ref 15–41)
Albumin: 3.2 g/dL — ABNORMAL LOW (ref 3.5–5.0)
Alkaline Phosphatase: 88 U/L (ref 38–126)
Anion gap: 9 (ref 5–15)
BUN: 18 mg/dL (ref 8–23)
CO2: 22 mmol/L (ref 22–32)
Calcium: 9 mg/dL (ref 8.9–10.3)
Chloride: 99 mmol/L (ref 98–111)
Creatinine, Ser: 1.28 mg/dL — ABNORMAL HIGH (ref 0.44–1.00)
GFR, Estimated: 47 mL/min — ABNORMAL LOW (ref >60)
Glucose, Bld: 112 mg/dL — ABNORMAL HIGH (ref 70–99)
Potassium: 4.8 mmol/L (ref 3.5–5.1)
Sodium: 130 mmol/L — ABNORMAL LOW (ref 135–145)
Total Bilirubin: 0.5 mg/dL (ref 0.3–1.2)
Total Protein: 7.5 g/dL (ref 6.5–8.1)

## 2022-11-19 LAB — CBG MONITORING, ED: Glucose-Capillary: 98 mg/dL (ref 70–99)

## 2022-11-19 LAB — ETHANOL: Alcohol, Ethyl (B): <10 mg/dL (ref <10)

## 2022-11-19 LAB — VALPROIC ACID LEVEL: Valproic Acid Lvl: 65 ug/mL (ref 50.0–100.0)

## 2022-11-19 LAB — MAGNESIUM: Magnesium: 1.7 mg/dL (ref 1.7–2.4)

## 2022-11-19 LAB — HEMOGLOBIN A1C
Hgb A1c MFr Bld: 6.4 % — ABNORMAL HIGH (ref 4.8–5.6)
Mean Plasma Glucose: 136.98 mg/dL

## 2022-11-19 LAB — CK: Total CK: 114 U/L (ref 38–234)

## 2022-11-19 MED ORDER — ONDANSETRON HCL 4 MG/2ML IJ SOLN: 4.0000 mg | Freq: Four times a day (QID) | INTRAMUSCULAR | Status: DC | PRN

## 2022-11-19 MED ORDER — OLANZAPINE 5 MG PO TABS: 5.0000 mg | ORAL_TABLET | Freq: Every day | ORAL | Status: DC

## 2022-11-19 MED ORDER — ACETAMINOPHEN 325 MG PO TABS
650.0000 mg | ORAL_TABLET | Freq: Four times a day (QID) | ORAL | Status: DC | PRN
  Administered 2022-11-19 – 2022-11-20 (×2): 650 mg via ORAL
  Filled 2022-11-19 (×2): qty 2

## 2022-11-19 MED ORDER — OLANZAPINE 5 MG PO TABS: 2.5000 mg | ORAL_TABLET | Freq: Every day | ORAL | Status: DC

## 2022-11-19 MED ORDER — METOPROLOL TARTRATE 25 MG PO TABS: 25.0000 mg | ORAL_TABLET | Freq: Two times a day (BID) | ORAL | Status: DC

## 2022-11-19 MED ORDER — ADULT MULTIVITAMIN W/MINERALS CH
1.0000 | ORAL_TABLET | Freq: Every day | ORAL | Status: DC
  Administered 2022-11-19 – 2022-11-23 (×5): 1 via ORAL
  Filled 2022-11-19 (×5): qty 1

## 2022-11-19 MED ORDER — DIVALPROEX SODIUM 250 MG PO DR TAB: 500.0000 mg | DELAYED_RELEASE_TABLET | Freq: Two times a day (BID) | ORAL | Status: DC

## 2022-11-19 MED ORDER — GADOBUTROL 1 MMOL/ML IV SOLN
7.0000 mL | Freq: Once | INTRAVENOUS | Status: AC | PRN
  Administered 2022-11-19: 7 mL via INTRAVENOUS

## 2022-11-19 MED ORDER — MELATONIN 3 MG PO TABS
3.0000 mg | ORAL_TABLET | Freq: Every day | ORAL | Status: DC
  Administered 2022-11-19 – 2022-11-22 (×4): 3 mg via ORAL
  Filled 2022-11-19 (×4): qty 1

## 2022-11-19 MED ORDER — PANTOPRAZOLE SODIUM 40 MG PO TBEC
40.0000 mg | DELAYED_RELEASE_TABLET | Freq: Every day | ORAL | Status: DC
  Administered 2022-11-19 – 2022-11-23 (×5): 40 mg via ORAL
  Filled 2022-11-19 (×5): qty 1

## 2022-11-19 MED ORDER — LACOSAMIDE 50 MG PO TABS: 200.0000 mg | ORAL_TABLET | Freq: Two times a day (BID) | ORAL | Status: DC

## 2022-11-19 MED ORDER — STROKE: EARLY STAGES OF RECOVERY BOOK
Freq: Once | Status: AC
  Filled 2022-11-19 (×2): qty 1

## 2022-11-19 MED ORDER — VALPROATE SODIUM 100 MG/ML IV SOLN
500.0000 mg | Freq: Two times a day (BID) | INTRAVENOUS | Status: DC
  Administered 2022-11-19 – 2022-11-21 (×5): 500 mg via INTRAVENOUS
  Filled 2022-11-19 (×4): qty 5
  Filled 2022-11-19 (×2): qty 500
  Filled 2022-11-19: qty 5
  Filled 2022-11-19: qty 500
  Filled 2022-11-19 (×2): qty 5

## 2022-11-19 MED ORDER — ASPIRIN 81 MG PO TBEC
81.0000 mg | DELAYED_RELEASE_TABLET | Freq: Every day | ORAL | Status: DC
  Administered 2022-11-19 – 2022-11-23 (×5): 81 mg via ORAL
  Filled 2022-11-19 (×5): qty 1

## 2022-11-19 MED ORDER — ATORVASTATIN CALCIUM 40 MG PO TABS
40.0000 mg | ORAL_TABLET | Freq: Every day | ORAL | Status: DC
  Administered 2022-11-19 – 2022-11-22 (×4): 40 mg via ORAL
  Filled 2022-11-19 (×4): qty 1

## 2022-11-19 MED ORDER — PERFLUTREN LIPID MICROSPHERE
1.0000 mL | INTRAVENOUS | Status: AC | PRN
  Administered 2022-11-19: 2 mL via INTRAVENOUS

## 2022-11-19 MED ORDER — FOLIC ACID 1 MG PO TABS
1.0000 mg | ORAL_TABLET | Freq: Every day | ORAL | Status: DC
  Administered 2022-11-19 – 2022-11-23 (×5): 1 mg via ORAL
  Filled 2022-11-19 (×5): qty 1

## 2022-11-19 MED ORDER — ACETAMINOPHEN 650 MG RE SUPP: 650.0000 mg | Freq: Four times a day (QID) | RECTAL | Status: DC | PRN

## 2022-11-19 MED ORDER — LISINOPRIL 5 MG PO TABS
5.0000 mg | ORAL_TABLET | Freq: Every day | ORAL | Status: DC
  Administered 2022-11-19 – 2022-11-23 (×4): 5 mg via ORAL
  Filled 2022-11-19 (×5): qty 1

## 2022-11-19 MED ORDER — ENOXAPARIN SODIUM 40 MG/0.4ML IJ SOSY
40.0000 mg | PREFILLED_SYRINGE | INTRAMUSCULAR | Status: DC
  Administered 2022-11-19 – 2022-11-22 (×4): 40 mg via SUBCUTANEOUS
  Filled 2022-11-19 (×4): qty 0.4

## 2022-11-19 MED ORDER — ONDANSETRON HCL 4 MG PO TABS: 4.0000 mg | ORAL_TABLET | Freq: Four times a day (QID) | ORAL | Status: DC | PRN

## 2022-11-19 MED ORDER — IOHEXOL 350 MG/ML SOLN
75.0000 mL | Freq: Once | INTRAVENOUS | Status: AC | PRN
  Administered 2022-11-19: 75 mL via INTRAVENOUS

## 2022-11-19 NOTE — Assessment & Plan Note (Addendum)
Patient with chronic neurologic deficits.  Continue neuro checks and further neurologic work up for possible new CVA.  Continue with statin and aspirin. Blood pressure control with lisinopril.

## 2022-11-19 NOTE — ED Notes (Addendum)
I spoke with family, was told that the patient was talking last night and laughing. The patient was able to move upper extremities, able to cloth herself and feed herself. Patient could not lift both arms and had slurred speech. Could not find the charge nurse, spoke with Herbert Seta and Victorino Dike about the concern. We then talked to Dr Charm Barges and he advised that he would order a CT scan with contrast.

## 2022-11-19 NOTE — Assessment & Plan Note (Signed)
Continue with atorvastatin 

## 2022-11-19 NOTE — Assessment & Plan Note (Addendum)
Continue blood pressure control, at home taking lisinopril 5 mg daily.

## 2022-11-19 NOTE — Assessment & Plan Note (Signed)
Resume olanzapine per her home regimen.

## 2022-11-19 NOTE — Assessment & Plan Note (Addendum)
Patient is improving in her mentation but not back to her baseline.   Plan to continue neuro checks q 4 hr Aspiration precautions and keep NPO for now Continue anti seizure regimen with divaloproex  (change to IV formulation with valproic acid), patient not longer on lacosamide Follow up with neurology recommendations of Brain MRI and EEG monitoring.   Check lipid panel and echocardiogram.

## 2022-11-19 NOTE — Progress Notes (Signed)
LTM EEG hooked up and running - no initial skin breakdown - push button tested - [facility] monitoring.  

## 2022-11-19 NOTE — Plan of Care (Signed)
Discussed with Dr. Charm Barges   62 y.o. with history of strokes, epilepsy, HTN, HLD, remote substance abuse (cocaine, Ethanol, opiates)  No clear seizure trigger, patient with history of seizures with prolonged postictal state. Modified Rankin scale 4-5 based on history provided to me, baseline able to use upper extremities to feed herself ?dress herself, ambulates briefly w/ walker and assistance, conversant.   Last admission 08/2020, after admission on 08/23/2020 for code stroke after seizure, found to be in status epilepticus iso cocaine use with gradually improving right sided weakness  By 4/15 exam was "Awake, alert, looks at examiner but does not consistently follow commands, mumbles something but difficult to understand, cranial nerves appear intact, right-sided hemiparesis appears to be improving, constantly moving in bed" AEDs at last neuro eval Vimpat 200 mg twice daily and Depakote 1000mg  BID   Since discharge reportedly has made a lot of recovery in communicative abilities but does require significant nursing care. Vimpat reportedly stopped unclear when / by whom   Current vital signs: BP 138/82   Pulse 92   Temp 98 F (36.7 C) (Axillary)   Resp (!) 23   Ht 5\' 7"  (1.702 m)   Wt 71.7 kg   SpO2 98%   BMI 24.76 kg/m  Vital signs in last 24 hours: Temp:  [98 F (36.7 C)] 98 F (36.7 C) (07/03 0749) Pulse Rate:  [92-110] 92 (07/03 0815) Resp:  [19-23] 23 (07/03 0815) BP: (138-148)/(82-99) 138/82 (07/03 0815) SpO2:  [88 %-98 %] 98 % (07/03 0815) Weight:  [71.7 kg] 71.7 kg (07/03 0749)   Needs reconciliation, not on vimpat per EDP Current Outpatient Medications  Medication Instructions   acetaminophen (TYLENOL) 650 mg, Oral, Every 6 hours PRN   amLODipine (NORVASC) 10 mg, Oral, Daily   aspirin 325 mg, Oral, Daily   atorvastatin (LIPITOR) 40 mg, Oral, Daily at bedtime   divalproex (DEPAKOTE) 500 mg, Oral, Every 8 hours   folic acid (FOLVITE) 1 mg, Oral, Daily   lacosamide  (VIMPAT) 200 mg, Oral, 2 times daily   melatonin 3 mg, Oral, Daily at bedtime   metoprolol tartrate (LOPRESSOR) 25 mg, Oral, 2 times daily   Multiple Vitamin (MULTIVITAMIN WITH MINERALS) TABS tablet 1 tablet, Oral, Daily   OLANZapine (ZYPREXA) 2.5 MG tablet take 1 tablet by mouth daily and  take 2 tablets at bedtime   pantoprazole (PROTONIX) 40 mg, Oral, Daily   Basic Metabolic Panel: Recent Labs  Lab 11/19/22 0744  NA 130*  K 4.8  CL 99  CO2 22  GLUCOSE 112*  BUN 18  CREATININE 1.28*  CALCIUM 9.0  MG 1.7    CBC: Recent Labs  Lab 11/19/22 0744  WBC 9.7  NEUTROABS 7.6  HGB 11.6*  HCT 34.5*  MCV 95.0  PLT 200    Coagulation Studies: No results for input(s): "LABPROT", "INR" in the last 72 hours.   Lab Results  Component Value Date   VALPROATE 65 11/19/2022   EtOH negative  CXR negative Head CT personally reviewed, agree with radiology negative CTA personally reviewed, agree with radiology no LVO or clinically significant stenosis  Recs: - Admit to [facility] for LTM EEG, ED to ED if appropriate inpatient bed not available - Continue home depakote please clarify current dose with family and ask pharmacy for appropriate IV conversion (typically needs q6 to q8 hr dosing IV); do not miss doses - CK added on, treat as needed - MRI brain w/ and w/o constrast pending transfer to rule out  acute process given no clear trigger - [facility] on arrival for full consultation and LTM EEG order - UA UDS pending  Brooke Dare MD-PhD [facility] [phone]

## 2022-11-19 NOTE — Progress Notes (Signed)
*  PRELIMINARY RESULTS* Echocardiogram 2D Echocardiogram has been performed with [facility].  Stacey Drain 11/19/2022, 3:14 PM

## 2022-11-19 NOTE — ED Provider Notes (Signed)
[facility] DEPARTMENT AT [facility] Provider Note   CSN: [id number] Arrival date & time: 11/19/22  4098     History  No chief complaint on file.   Cindy [name] is a 62 y.o. female.  Level 5 caveat secondary to altered mental status.  Patient brought from home by EMS for seizure.  Patient lives with sister who is primary caregiver, it sounds like at baseline she is nonverbal and has significant deficits from prior stroke.  History of seizures.  Reportedly had 15 to 20 minutes of tonic-clonic activity on right side.  Received 5 mg of Versed.  No other history available at this time.  The history is provided by the EMS personnel.  Seizures Seizure activity on arrival: no   Initial focality:  Right-sided Episode characteristics: abnormal movements and focal shaking   Return to baseline: no   Duration:  15 minutes Timing:  Once Number of seizures this episode:  1 Progression:  Resolved PTA treatment:  Midazolam History of seizures: yes        Home Medications Prior to Admission medications   Medication Sig Start Date End Date Taking? Authorizing Provider  acetaminophen (TYLENOL) 325 MG tablet Take 2 tablets (650 mg total) by mouth every 6 (six) hours as needed (pain). 09/21/20   Angiulli, Mcarthur Rossetti, PA-C  amLODipine (NORVASC) 10 MG tablet Take 1 tablet (10 mg total) by mouth daily. 10/26/20   Angiulli, Mcarthur Rossetti, PA-C  aspirin 325 MG tablet Take 1 tablet (325 mg total) by mouth daily. 09/21/20   Angiulli, Mcarthur Rossetti, PA-C  atorvastatin (LIPITOR) 40 MG tablet Take 1 tablet (40 mg total) by mouth at bedtime. 10/26/20   Angiulli, Mcarthur Rossetti, PA-C  divalproex (DEPAKOTE) 500 MG DR tablet Take 1 tablet (500 mg total) by mouth every 8 (eight) hours. 10/26/20   Angiulli, Mcarthur Rossetti, PA-C  folic acid (FOLVITE) 1 MG tablet Take 1 tablet (1 mg total) by mouth daily. 10/26/20   Angiulli, Mcarthur Rossetti, PA-C  lacosamide (VIMPAT) 200 MG TABS tablet Take 1 tablet (200 mg total) by mouth 2 (two)  times daily. 10/26/20   Angiulli, Mcarthur Rossetti, PA-C  melatonin 3 MG TABS tablet Take 1 tablet (3 mg total) by mouth at bedtime. 10/26/20   Angiulli, Mcarthur Rossetti, PA-C  metoprolol tartrate (LOPRESSOR) 25 MG tablet Take 1 tablet (25 mg total) by mouth 2 (two) times daily. 09/21/20   Angiulli, Mcarthur Rossetti, PA-C  Multiple Vitamin (MULTIVITAMIN WITH MINERALS) TABS tablet Take 1 tablet by mouth daily. 02/18/20   Laural Benes, Clanford L, MD  OLANZapine (ZYPREXA) 2.5 MG tablet take 1 tablet by mouth daily and  take 2 tablets at bedtime 10/26/20   Angiulli, Mcarthur Rossetti, PA-C  pantoprazole (PROTONIX) 40 MG tablet Take 1 tablet (40 mg total) by mouth daily. 10/26/20   Angiulli, Mcarthur Rossetti, PA-C      Allergies    Patient has no known allergies.    Review of Systems   Review of Systems  Unable to perform ROS: Mental status change  Neurological:  Positive for seizures.    Physical Exam Updated Vital Signs BP 138/72 (BP Location: Left Arm)   Pulse 85   Temp 98 F (36.7 C) (Oral)   Resp 17   Ht 5\' 7"  (1.702 m)   Wt 71.7 kg   SpO2 98%   BMI 24.76 kg/m  Physical Exam Vitals and nursing note reviewed.  Constitutional:      General: She is not in acute  distress.    Appearance: Normal appearance. She is well-developed.  HENT:     Head: Normocephalic and atraumatic.  Eyes:     Conjunctiva/sclera: Conjunctivae normal.  Cardiovascular:     Rate and Rhythm: Regular rhythm. Tachycardia present.     Heart sounds: No murmur heard. Pulmonary:     Effort: Pulmonary effort is normal. No respiratory distress.     Breath sounds: Normal breath sounds.  Abdominal:     Palpations: Abdomen is soft.     Tenderness: There is no abdominal tenderness. There is no guarding or rebound.  Musculoskeletal:        General: No deformity. Normal range of motion.     Cervical back: Neck supple.  Skin:    General: Skin is warm and dry.     Capillary Refill: Capillary refill takes less than 2 seconds.  Neurological:     Comments: She is  somnolent but will open her eyes to gentle stimulation.  She is not following neurologic exam.  No gross facial asymmetry.  She withdraws both lower extremities to painful stimuli.     ED Results / Procedures / Treatments   Labs (all labs ordered are listed, but only abnormal results are displayed) Labs Reviewed  COMPREHENSIVE METABOLIC PANEL - Abnormal; Notable for the following components:      Result Value   Sodium 130 (*)    Glucose, Bld 112 (*)    Creatinine, Ser 1.28 (*)    Albumin 3.2 (*)    GFR, Estimated 47 (*)    All other components within normal limits  CBC WITH DIFFERENTIAL/PLATELET - Abnormal; Notable for the following components:   RBC 3.63 (*)    Hemoglobin 11.6 (*)    HCT 34.5 (*)    All other components within normal limits  RAPID URINE DRUG SCREEN, HOSP PERFORMED - Abnormal; Notable for the following components:   Benzodiazepines POSITIVE (*)    All other components within normal limits  MAGNESIUM  URINALYSIS, ROUTINE W REFLEX MICROSCOPIC  ETHANOL  VALPROIC ACID LEVEL  CK  HIV ANTIBODY (ROUTINE TESTING W REFLEX)  HEMOGLOBIN A1C  BASIC METABOLIC PANEL  CBC  LIPID PANEL  CBG MONITORING, ED    EKG EKG Interpretation Date/Time:  Wednesday November 19 2022 07:50:18 EDT Ventricular Rate:  110 PR Interval:  152 QRS Duration:  83 QT Interval:  329 QTC Calculation: 445 R Axis:   18  Text Interpretation: Sinus tachycardia No significant change since prior 4/22 Confirmed by Meridee Score [phone]) on 11/19/2022 7:58:40 AM  Radiology ECHOCARDIOGRAM COMPLETE  Result Date: 11/19/2022    ECHOCARDIOGRAM REPORT   Patient Name:   Cindy [name] Date of Exam: 11/19/2022 Medical Rec #:  [id number]    Height:       67.0 in Accession #:    [id number]   Weight:       158.1 lb Date of Birth:  16-Apr-1961   BSA:          1.830 m Patient Age:    61 years     BP:           147/86 mmHg Patient Gender: F            HR:           92 bpm. [name] Procedure: 2D Echo,  Cardiac Doppler and Color Doppler Indications:    TIA G45.9  History:        Patient has prior history of Echocardiogram examinations,  most                 recent 08/06/2019. Stroke; Risk Factors:Hypertension and                 Dyslipidemia. ALCOHOL USE, Cocaine abuse (HCC).  Sonographer:    Celesta Gentile RCS Referring Phys: [id number] MAURICIO DANIEL ARRIEN IMPRESSIONS  1. Left ventricular ejection fraction, by estimation, is 65 to 70%. The left ventricle has normal function. The left ventricle has no regional wall motion abnormalities. There is mild left ventricular hypertrophy. Left ventricular diastolic parameters are consistent with Grade I diastolic dysfunction (impaired relaxation).  2. Right ventricular systolic function is normal. The right ventricular size is normal.  3. The mitral valve is normal in structure. No evidence of mitral valve regurgitation. No evidence of mitral stenosis.  4. The aortic valve is tricuspid. Aortic valve regurgitation is not visualized. No aortic stenosis is present.  5. The inferior vena cava is normal in size with greater than 50% respiratory variability, suggesting right atrial pressure of 3 mmHg. FINDINGS  Left Ventricle: Left ventricular ejection fraction, by estimation, is 65 to 70%. The left ventricle has normal function. The left ventricle has no regional wall motion abnormalities. Definity contrast agent was given IV to delineate the left ventricular  endocardial borders. The left ventricular internal cavity size was normal in size. There is mild left ventricular hypertrophy. Left ventricular diastolic parameters are consistent with Grade I diastolic dysfunction (impaired relaxation). Normal left ventricular filling pressure. Right Ventricle: The right ventricular size is normal. Right vetricular wall thickness was not well visualized. Right ventricular systolic function is normal. Left Atrium: Left atrial size was normal in size. Right Atrium: Right atrial size was normal  in size. Pericardium: There is no evidence of pericardial effusion. Mitral Valve: The mitral valve is normal in structure. No evidence of mitral valve regurgitation. No evidence of mitral valve stenosis. Tricuspid Valve: The tricuspid valve is normal in structure. Tricuspid valve regurgitation is not demonstrated. No evidence of tricuspid stenosis. Aortic Valve: The aortic valve is tricuspid. Aortic valve regurgitation is not visualized. No aortic stenosis is present. Aortic valve mean gradient measures 2.7 mmHg. Aortic valve peak gradient measures 5.5 mmHg. Aortic valve area, by VTI measures 2.15 cm. Pulmonic Valve: The pulmonic valve was not well visualized. Pulmonic valve regurgitation is not visualized. No evidence of pulmonic stenosis. Aorta: The aortic root is normal in size and structure. Venous: The inferior vena cava is normal in size with greater than 50% respiratory variability, suggesting right atrial pressure of 3 mmHg. IAS/Shunts: No atrial level shunt detected by color flow Doppler.  LEFT VENTRICLE PLAX 2D LVIDd:         3.70 cm   Diastology LVIDs:         1.90 cm   LV e' medial:    5.98 cm/s LV PW:         1.20 cm   LV E/e' medial:  7.8 LV IVS:        1.10 cm   LV e' lateral:   7.40 cm/s LVOT diam:     1.60 cm   LV E/e' lateral: 6.3 LV SV:         46 LV SV Index:   25 LVOT Area:     2.01 cm  RIGHT VENTRICLE RV S prime:     16.30 cm/s TAPSE (M-mode): 2.0 cm LEFT ATRIUM             Index  RIGHT ATRIUM          Index LA diam:        2.30 cm 1.26 cm/m   RA Area:     8.50 cm LA Vol (A2C):   23.5 ml 12.84 ml/m  RA Volume:   15.60 ml 8.52 ml/m LA Vol (A4C):   28.7 ml 15.68 ml/m LA Biplane Vol: 27.9 ml 15.25 ml/m  AORTIC VALVE AV Area (Vmax):    1.79 cm AV Area (Vmean):   2.06 cm AV Area (VTI):     2.15 cm AV Vmax:           117.71 cm/s AV Vmean:          75.413 cm/s AV VTI:            0.212 m AV Peak Grad:      5.5 mmHg AV Mean Grad:      2.7 mmHg LVOT Vmax:         105.00 cm/s LVOT Vmean:         77.200 cm/s LVOT VTI:          0.227 m LVOT/AV VTI ratio: 1.07  AORTA Ao Root diam: 3.40 cm MITRAL VALVE MV Area (PHT): 5.38 cm    SHUNTS MV Decel Time: 141 msec    Systemic VTI:  0.23 m MV E velocity: 46.60 cm/s  Systemic Diam: 1.60 cm MV A velocity: 86.40 cm/s MV E/A ratio:  0.54 Dina Rich MD Electronically signed by Dina Rich MD Signature Date/Time: 11/19/2022/3:22:38 PM    Final    MR Brain W and Wo Contrast  Result Date: 11/19/2022 CLINICAL DATA:  Provided history: Neuro deficit, acute, stroke suspected. EXAM: MRI HEAD WITHOUT AND WITH CONTRAST TECHNIQUE: Multiplanar, multiecho pulse sequences of the brain and surrounding structures were obtained without and with intravenous contrast. CONTRAST:  7mL GADAVIST GADOBUTROL 1 MMOL/ML IV SOLN COMPARISON:  Non-contrast head CT and CT angiogram head/neck 11/19/2022. Brain MRI 08/28/2020. FINDINGS: Intermittently motion degraded examination. Most notably, the axial T1-weighted sequence is moderate-to-severely motion degraded, the axial T1-weighted post-contrast sequence is severely motion degraded, the coronal T1-weighted post-contrast sequence is moderate-to-severely motion degraded and the sagittal T1-weighted post-contrast sequence is severely motion degraded. Within this limitation, findings are as follows. Brain: Generalized cerebral atrophy which is significantly advanced for age. Mild-to-moderate cerebellar atrophy. Commensurate prominence of the ventricles and sulci. Chronic lacunar infarcts within the bilateral cerebral hemispheric white matter and bilateral deep gray nuclei. Background advanced patchy and confluent T2 FLAIR hyperintense signal abnormality within the cerebral white matter. Small chronic infarct within the medial left cerebellar hemisphere. There is no acute infarct. No evidence of an intracranial mass. No chronic intracranial blood products. No extra-axial fluid collection. No midline shift. No definite pathologic  intracranial enhancement is identified, although evaluation is markedly limited due to motion degradation of the post-contrast imaging. Vascular: Maintained flow voids within the proximal large arterial vessels. Skull and upper cervical spine: No focal suspicious marrow lesion. Incompletely assessed cervical spondylosis. Sinuses/Orbits: No mass or acute finding within the imaged orbits. Mild mucosal thickening within the right maxillary sinus. Other: Small-volume fluid within the bilateral mastoid air cells. IMPRESSION: 1. Significantly motion degraded examination, as described. 2. Within this limitation, no acute intracranial abnormality is identified. The diffusion-weighted imaging is of good quality and there is no evidence of an acute infarct. 3. Redemonstrated chronic lacunar infarcts within the bilateral cerebral hemispheric white matter and within the bilateral deep gray nuclei. 4. Redemonstrated small chronic infarct within  the left cerebellar hemisphere. 5. Background advanced cerebral white matter disease, nonspecific but favored to reflect sequelae of chronic small vessel ischemia (given the presence of multiple chronic infarcts within the supratentorial and infratentorial brain). 6. Cerebral and cerebellar atrophy, significantly advanced for age. 7. Mild right maxillary sinus mucosal thickening. 8. Small-volume fluid within the bilateral mastoid air cells. Electronically Signed   By: Jackey Loge D.O.   On: 11/19/2022 12:44   CT ANGIO HEAD NECK W WO CM  Result Date: 11/19/2022 CLINICAL DATA:  Neuro deficit, acute, stroke suspected. Seizure presentation. EXAM: CT ANGIOGRAPHY HEAD AND NECK WITH AND WITHOUT CONTRAST TECHNIQUE: Multidetector CT imaging of the head and neck was performed using the standard protocol during bolus administration of intravenous contrast. Multiplanar CT image reconstructions and MIPs were obtained to evaluate the vascular anatomy. Carotid stenosis measurements (when applicable)  are obtained utilizing NASCET criteria, using the distal internal carotid diameter as the denominator. RADIATION DOSE REDUCTION: This exam was performed according to the departmental dose-optimization program which includes automated exposure control, adjustment of the mA and/or kV according to patient size and/or use of iterative reconstruction technique. CONTRAST:  75mL OMNIPAQUE IOHEXOL 350 MG/ML SOLN COMPARISON:  Head CT same day.  MRI 08/28/2020. FINDINGS: CTA NECK FINDINGS Aortic arch: Aortic arch appears normal. Right carotid system: Common carotid artery widely patent to the bifurcation. Mild atherosclerotic plaque at the carotid bifurcation and ICA bulb but no stenosis. Cervical ICA beyond that is tortuous but widely patent. Left carotid system: Common carotid artery widely patent to the bifurcation. Atherosclerotic plaque at the bifurcation and ICA bulb but no stenosis. Cervical ICA beyond that is tortuous but widely patent. Vertebral arteries: Both vertebral artery origins are widely patent. Both vertebral arteries appear normal through the cervical region to the foramen magnum. Skeleton: Ordinary mid cervical degenerative spondylosis. Other neck: No mass or lymphadenopathy. Upper chest: Lung apices are clear. Review of the MIP images confirms the above findings CTA HEAD FINDINGS Anterior circulation: Both internal carotid arteries are patent through the skull base and siphon regions. Ordinary siphon atherosclerotic calcification but no stenosis. The anterior and middle cerebral vessels are patent. No large vessel occlusion or proximal stenosis. 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 No aneurysm or vascular malformation. Posterior circulation: Both vertebral arteries are patent to the basilar artery. There is atherosclerotic calcification in both vertebral artery V4 segments with narrowing estimated at 30-50% on both sides. No basilar stenosis. Superior cerebellar and posterior cerebral arteries  appear normal. Venous sinuses: Patent and normal. Anatomic variants: None significant otherwise. Review of the MIP images confirms the above findings IMPRESSION: 1. No large vessel occlusion. 2. Atherosclerotic disease at both carotid bifurcations but without stenosis. 3. 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 Electronically Signed   By: Paulina Fusi M.D.   On: 11/19/2022 10:52   CT HEAD WO CONTRAST  Result Date: 11/19/2022 CLINICAL DATA:  Mental status change, unknown cause EXAM: CT HEAD WITHOUT CONTRAST TECHNIQUE: Contiguous axial images were obtained from the base of the skull through the vertex without intravenous contrast. RADIATION DOSE REDUCTION: This exam was performed according to the departmental dose-optimization program which includes automated exposure control, adjustment of the mA and/or kV according to patient size and/or use of iterative reconstruction technique. COMPARISON:  CT Head 08/23/20, Brain MR 08/23/20, 08/28/20 FINDINGS: Brain: No evidence of acute infarction, hemorrhage, hydrocephalus, extra-axial collection or mass lesion/mass effect. There is sequela moderate chronic microvascular ischemic  change with advanced generalized volume loss. Chronic right thalamic infarct chronic infarct in the right basal ganglia. Vascular: No hyperdense vessel or unexpected calcification. Skull: Normal. Negative for fracture or focal lesion. Sinuses/Orbits: No middle ear or mastoid effusion. Paranasal sinuses are notable for mucosal thickening in the right maxillary sinus. Orbits are unremarkable. Other: None. IMPRESSION: No acute intracranial abnormality. Electronically Signed   By: Lorenza Cambridge M.D.   On: 11/19/2022 08:47   DG Chest Port 1 View  Result Date: 11/19/2022 CLINICAL DATA:  Provided history: Altered mental status.  Seizure. EXAM: PORTABLE CHEST 1 VIEW COMPARISON:  Prior chest radiographs 09/02/2020 and earlier. FINDINGS:  Shallow inspiration radiograph with accentuation of the cardiac silhouette. Taking this into account, heart size appears at the upper limits of normal. No appreciable airspace consolidation or pulmonary edema. No evidence of pleural effusion or pneumothorax. No acute osseous abnormality identified. IMPRESSION: Shallow inspiration radiograph. No evidence of an acute cardiopulmonary abnormality. Electronically Signed   By: Jackey Loge D.O.   On: 11/19/2022 08:11    Procedures .Critical Care  Performed by: Terrilee Files, MD Authorized by: Terrilee Files, MD   Critical care provider statement:    Critical care time (minutes):  45   Critical care time was exclusive of:  Separately billable procedures and treating other patients   Critical care was necessary to treat or prevent imminent or life-threatening deterioration of the following conditions:  CNS failure or compromise   Critical care was time spent personally by me on the following activities:  Development of treatment plan with patient or surrogate, discussions with consultants, evaluation of patient's response to treatment, examination of patient, obtaining history from patient or surrogate, ordering and performing treatments and interventions, ordering and review of laboratory studies, ordering and review of radiographic studies, pulse oximetry, re-evaluation of patient's condition and review of old charts   I assumed direction of critical care for this patient from another provider in my specialty: no       Medications Ordered in ED Medications  atorvastatin (LIPITOR) tablet 40 mg (has no administration in time range)  pantoprazole (PROTONIX) EC tablet 40 mg (40 mg Oral Given 11/19/22 1243)  folic acid (FOLVITE) tablet 1 mg (1 mg Oral Given 11/19/22 1345)  melatonin tablet 3 mg (has no administration in time range)  multivitamin with minerals tablet 1 tablet (1 tablet Oral Given 11/19/22 1243)  enoxaparin (LOVENOX) injection 40 mg (40  mg Subcutaneous Given 11/19/22 1241)  acetaminophen (TYLENOL) tablet 650 mg (has no administration in time range)    Or  acetaminophen (TYLENOL) suppository 650 mg (has no administration in time range)  ondansetron (ZOFRAN) tablet 4 mg (has no administration in time range)    Or  ondansetron (ZOFRAN) injection 4 mg (has no administration in time range)  OLANZapine (ZYPREXA) tablet 5 mg (has no administration in time range)  OLANZapine (ZYPREXA) tablet 2.5 mg (has no administration in time range)   stroke: early stages of recovery book (has no administration in time range)  lisinopril (ZESTRIL) tablet 5 mg (5 mg Oral Given 11/19/22 1243)  valproate (DEPACON) 500 mg in dextrose 5 % 50 mL IVPB (500 mg Intravenous New Bag/Given 11/19/22 1333)  aspirin EC tablet 81 mg (81 mg Oral Given 11/19/22 1243)  perflutren lipid microspheres (DEFINITY) IV suspension (2 mLs Intravenous Given 11/19/22 1454)  iohexol (OMNIPAQUE) 350 MG/ML injection 75 mL (75 mLs Intravenous Contrast Given 11/19/22 1035)  gadobutrol (GADAVIST) 1 MMOL/ML injection 7 mL (7 mLs Intravenous  Contrast Given 11/19/22 1155)    ED Course/ Medical Decision Making/ A&P Clinical Course as of 11/19/22 1736  Wed Nov 19, 2022  0806 Prior notes say she was on Vimpat and Depakote.  The meds that EMS brought in show her only to be on Depakote for seizures. [MB]  [id number] Chest x-ray interpreted by me as no acute infiltrate.  Awaiting radiology reading. [MB]  [phone] Patient's sister is here now who can give more of the history.  She said she has been caring her for a year has not had a seizure during that time.  Her baseline status is different than what EMS gave me.  She is normally awake alert conversant able to feed self.  She can assist with getting herself up although needs some support but can actually ambulate a little bit with a walker.  She was taken off of the Vimpat a while ago and has been on Depakote.  She has been compliant with her medications.  No  recent illness.  No falls. [MB]  1056 CTA does not show any signs of LVO.  I reviewed case with neurology Dr. Iver Nestle.  She said on review of her prior records she had a prolonged postictal phase.  She is going to reach out to neurology here regarding a possible EEG. [MB]  1103 Neurology Dr. Melynda Ripple has reviewed the chart and message me that the patient probably would do better at [facility] due to needing EEG and probably prolonged monitoring. [MB]  1114 Discussed with [facility] hospitalist Dr. Ella Jubilee who will evaluate patient for admission.  MRI has been ordered with and without contrast per neurology recommendations. [MB]    Clinical Course User Index [MB] Terrilee Files, MD                             Medical Decision Making Amount and/or Complexity of Data Reviewed Labs: ordered. Radiology: ordered.  Risk Prescription drug management. Decision regarding hospitalization.   This patient complains of seizure; this involves an extensive number of treatment Options and is a complaint that carries with it a high risk of complications and morbidity. The differential includes seizure, stroke, bleed, metabolic derangement, medication compliance  I ordered, reviewed and interpreted labs, which included CBC with normal white count, low hemoglobin, chemistries with low sodium elevated creatinine, Depakote therapeutic, urinalysis without signs of infection I ordered imaging studies which included CT head CT angio head and neck chest x-ray MRI and I independently    visualized and interpreted imaging which showed no acute stroke Additional history obtained from patient's sister Previous records obtained and reviewed in epic including prior neurology and admission notes I consulted neurology Dr. Iver Nestle and [facility] hospitalist Dr. Ella Jubilee and discussed lab and imaging findings and discussed disposition.  Cardiac monitoring reviewed, normal sinus rhythm Social determinants considered, no significant  barriers Critical Interventions: Initiation of complex workup for possible LVO with head CT CT angio MRI brain and discussion with neurology  After the interventions stated above, I reevaluated the patient and found patient not to be back to baseline but significantly improved Admission and further testing considered, neurology is recommending admission to [facility] for EEG and further workup.  Family in agreement with patient transfer.         Final Clinical Impression(s) / ED Diagnoses Final diagnoses:  Seizure (HCC)    Rx / DC Orders ED Discharge Orders     None  Terrilee Files, MD 11/19/22 1740

## 2022-11-19 NOTE — H&P (Signed)
History and Physical    Patient: Cindy [name] ZOX:[id number] DOB: July 19, 1960 DOA: 11/19/2022 DOS: the patient was seen and examined on 11/19/2022 PCP: Shelby Dubin, FNP  Patient coming from: Home  Chief Complaint:  Chief Complaint  Patient presents with   Seizures   HPI: Cindy [name] is a 62 y.o. female with medical history significant of prior CVA, hypertension, hyperlipidemia and seizures. Remote history of polysubstance abuse. She presented with acute change in mental status.  Patient lives with her sister, at baseline she she able to converse and feed herself, her ambulation is limited due to prior CVA, but per report from her family she has been doing progress over the last 12 to 13 months in improving her physical functional capacity. She has been complaint with her medications, and has been at her usual state of health until this morning around 6 am. At the time when she went to sleep last night she was at her baseline. This morning she was heard making loud noises, and when her sister went to check on her she was having generalized tonic clonic movements, upper and lower extremities, she has unconscious and not responsive during this event that lasted about 45 minutes until EMS arrived. Apparently EMS administered sedative agent, I assumed was a benzodiazepine, that helped in controlling her tonic clonic movements.    Review of Systems: unable to review all systems due to the inability of the patient to answer questions. All information from her family at the bedside and on speaker phone.   Past Medical History:  Diagnosis Date   Abnormal LFTs    Hx of   CVA (cerebral vascular accident) (HCC) 2005   Hyperlipidemia    Hypertension    Polysubstance dependence including opioid type drug, episodic abuse (HCC)    cocaine/etoh    Seizures (HCC)    Past Surgical History:  Procedure Laterality Date   ESOPHAGOGASTRODUODENOSCOPY N/A 08/16/2019   Procedure: ESOPHAGOGASTRODUODENOSCOPY  (EGD);  Surgeon: Vida Rigger, MD;  Location: [facility] ENDOSCOPY;  Service: Endoscopy;  Laterality: N/A;   Social History:  reports that she has never smoked. She has never used smokeless tobacco. She reports current alcohol use. She reports current drug use. Drug: Cocaine.  No Known Allergies  Family History  Problem Relation Age of Onset   Cancer Father        prostate    Hypertension Mother    Asthma Mother    Bronchitis Mother     Prior to Admission medications   Medication Sig Start Date End Date Taking? Authorizing Provider  acetaminophen (TYLENOL) 325 MG tablet Take 2 tablets (650 mg total) by mouth every 6 (six) hours as needed (pain). 09/21/20   Angiulli, Mcarthur Rossetti, PA-C  amLODipine (NORVASC) 10 MG tablet Take 1 tablet (10 mg total) by mouth daily. 10/26/20   Angiulli, Mcarthur Rossetti, PA-C  aspirin 325 MG tablet Take 1 tablet (325 mg total) by mouth daily. 09/21/20   Angiulli, Mcarthur Rossetti, PA-C  atorvastatin (LIPITOR) 40 MG tablet Take 1 tablet (40 mg total) by mouth at bedtime. 10/26/20   Angiulli, Mcarthur Rossetti, PA-C  divalproex (DEPAKOTE) 500 MG DR tablet Take 1 tablet (500 mg total) by mouth every 8 (eight) hours. 10/26/20   Angiulli, Mcarthur Rossetti, PA-C  folic acid (FOLVITE) 1 MG tablet Take 1 tablet (1 mg total) by mouth daily. 10/26/20   Angiulli, Mcarthur Rossetti, PA-C  lacosamide (VIMPAT) 200 MG TABS tablet Take 1 tablet (200 mg total) by mouth 2 (two)  times daily. 10/26/20   Angiulli, Mcarthur Rossetti, PA-C  melatonin 3 MG TABS tablet Take 1 tablet (3 mg total) by mouth at bedtime. 10/26/20   Angiulli, Mcarthur Rossetti, PA-C  metoprolol tartrate (LOPRESSOR) 25 MG tablet Take 1 tablet (25 mg total) by mouth 2 (two) times daily. 09/21/20   Angiulli, Mcarthur Rossetti, PA-C  Multiple Vitamin (MULTIVITAMIN WITH MINERALS) TABS tablet Take 1 tablet by mouth daily. 02/18/20   Laural Benes, Clanford L, MD  OLANZapine (ZYPREXA) 2.5 MG tablet take 1 tablet by mouth daily and  take 2 tablets at bedtime 10/26/20   Angiulli, Mcarthur Rossetti, PA-C  pantoprazole  (PROTONIX) 40 MG tablet Take 1 tablet (40 mg total) by mouth daily. 10/26/20   Charlton Amor, PA-C    Physical Exam: Vitals:   11/19/22 0749 11/19/22 0800 11/19/22 0815  BP: (!) 148/99 (!) 140/91 138/82  Pulse: (!) 110 (!) 108 92  Resp: 19 (!) 22 (!) 23  Temp: 98 F (36.7 C)    TempSrc: Axillary    SpO2: 94% (!) 88% 98%  Weight: 71.7 kg    Height: 5\' 7"  (1.702 m)     Neurology patient has eyes open. She responds with mumbling sounds, but is able to follow commands. She is not able to move her right upper or lower extremities. On the left side able to move against gravity. ENT with mild pallor Cardiovascular with S1 and S2 present and rhythmic with no gallops, rubs or murmurs Respiratory with no rales or wheezing, no rhonchi Abdomen with no distention  No lower extremity edema No rashes.  Data Reviewed:   Na 130, K 4,8 CL 99, bicarbonate 22, glucose 112, bun 18 cr 1,28  Mg 1,7  Wbc 9,7 hgb 11,6 plt 200  Urine analysis SG 1,013, negative protein, negative hgb and negative leukocytes.  Toxicology screen positive for benzodiazepines   CT head with no acute changes CT head angio with no large vessel occlusion.   Chest radiograph with hypoinflation with no infiltrates or effusions   EKG 110 bpm, normal axis, normal intervals, sinus rhythm with right atrial enlargement, with no significant ST segment or T wave changes.   62 yo female with prior history of CVA and seizures, taking antiepileptic and antipsychotic agents at home who presented with a generalized tonic clonic seizure that lasted 45 minutes. Seizure was aborted with benzodiazepines. Currently she is awake and alert, but still not back to her baseline, continue to have speech disturbances and right sided paresis, this neurologic deficits seems to be new.   Neurology has been consulted and has recommended stat brain MRI and transfer to [facility] for EEG monitoring, along with formal neurology consultation.   Dx  Uncontrolled seizures, rule out acute cerebrovascular accident.   Assessment and Plan: * Seizure [facility]) Patient is improving in her mentation but not back to her baseline.   Plan to continue neuro checks q 4 hr Aspiration precautions and keep NPO for now Continue anti seizure regimen with divaloproex  (change to IV formulation with valproic acid), patient not longer on lacosamide Follow up with neurology recommendations of Brain MRI and EEG monitoring.   Check lipid panel and echocardiogram.  Essential hypertension Continue blood pressure control, at home taking lisinopril 5 mg daily.   Multiple cerebral infarctions [facility]) Patient with chronic neurologic deficits.  Continue neuro checks and further neurologic work up for possible new CVA.  Continue with statin and aspirin. Blood pressure control with lisinopril.   Agitation Resume olanzapine per  her home regimen.   HYPERCHOLESTEROLEMIA Continue with atorvastatin.       Advance Care Planning:   Code Status: DNR   Consults: Neurology   Family Communication: I spoke with patient's brother and sister at the bedside, we talked in detail about patient's condition, plan of care and prognosis and all questions were addressed.   Severity of Illness: The appropriate patient status for this patient is OBSERVATION. Observation status is judged to be reasonable and necessary in order to provide the required intensity of service to ensure the patient's safety. The patient's presenting symptoms, physical exam findings, and initial radiographic and laboratory data in the context of their medical condition is felt to place them at decreased risk for further clinical deterioration. Furthermore, it is anticipated that the patient will be medically stable for discharge from the hospital within 2 midnights of admission.   Author: Coralie Keens, MD 11/19/2022 11:31 AM  For on call review [email or web address].

## 2022-11-19 NOTE — ED Triage Notes (Signed)
Pt brought in by [facility]. Family called EMS for pt having seizures lasting 20-25 minutes.

## 2022-11-19 NOTE — ED Notes (Signed)
Spoke with speech therapist and advised that the patient was going to [facility], she advised that she wouldn't come down since she isn't going up on the floor.

## 2022-11-19 NOTE — ED Notes (Signed)
Seizure pads placed, patient placed on cardiac monitor, suction set up, and patients vitals signs done during arrival. Paramedic aware.

## 2022-11-20 DIAGNOSIS — Z8249 Family history of ischemic heart disease and other diseases of the circulatory system: Secondary | ICD-10-CM | POA: Diagnosis not present

## 2022-11-20 DIAGNOSIS — Z7982 Long term (current) use of aspirin: Secondary | ICD-10-CM | POA: Diagnosis not present

## 2022-11-20 DIAGNOSIS — R471 Dysarthria and anarthria: Secondary | ICD-10-CM | POA: Diagnosis present

## 2022-11-20 DIAGNOSIS — G8384 Todd's paralysis (postepileptic): Secondary | ICD-10-CM | POA: Diagnosis present

## 2022-11-20 DIAGNOSIS — I639 Cerebral infarction, unspecified: Secondary | ICD-10-CM | POA: Diagnosis not present

## 2022-11-20 DIAGNOSIS — G40909 Epilepsy, unspecified, not intractable, without status epilepticus: Secondary | ICD-10-CM | POA: Diagnosis not present

## 2022-11-20 DIAGNOSIS — R451 Restlessness and agitation: Secondary | ICD-10-CM | POA: Diagnosis not present

## 2022-11-20 DIAGNOSIS — R5381 Other malaise: Secondary | ICD-10-CM | POA: Diagnosis present

## 2022-11-20 DIAGNOSIS — R569 Unspecified convulsions: Secondary | ICD-10-CM | POA: Diagnosis not present

## 2022-11-20 DIAGNOSIS — I10 Essential (primary) hypertension: Secondary | ICD-10-CM | POA: Diagnosis not present

## 2022-11-20 DIAGNOSIS — Z8673 Personal history of transient ischemic attack (TIA), and cerebral infarction without residual deficits: Secondary | ICD-10-CM | POA: Diagnosis not present

## 2022-11-20 DIAGNOSIS — Z79899 Other long term (current) drug therapy: Secondary | ICD-10-CM | POA: Diagnosis not present

## 2022-11-20 DIAGNOSIS — R4701 Aphasia: Secondary | ICD-10-CM | POA: Diagnosis present

## 2022-11-20 DIAGNOSIS — G9341 Metabolic encephalopathy: Secondary | ICD-10-CM | POA: Diagnosis not present

## 2022-11-20 DIAGNOSIS — E119 Type 2 diabetes mellitus without complications: Secondary | ICD-10-CM | POA: Diagnosis present

## 2022-11-20 DIAGNOSIS — Z66 Do not resuscitate: Secondary | ICD-10-CM | POA: Diagnosis present

## 2022-11-20 DIAGNOSIS — E7800 Pure hypercholesterolemia, unspecified: Secondary | ICD-10-CM | POA: Diagnosis not present

## 2022-11-20 DIAGNOSIS — R531 Weakness: Secondary | ICD-10-CM | POA: Diagnosis present

## 2022-11-20 LAB — URINALYSIS, ROUTINE W REFLEX MICROSCOPIC
Bilirubin Urine: NEGATIVE
Glucose, UA: NEGATIVE mg/dL
Ketones, ur: NEGATIVE mg/dL
Nitrite: NEGATIVE
Protein, ur: NEGATIVE mg/dL
RBC / HPF: >50 RBC/hpf (ref 0–5)
Specific Gravity, Urine: 1.033 — ABNORMAL HIGH (ref 1.005–1.030)
pH: 7 (ref 5.0–8.0)

## 2022-11-20 LAB — LIPID PANEL
Cholesterol: 105 mg/dL (ref 0–200)
HDL: 51 mg/dL (ref >40)
LDL Cholesterol: 44 mg/dL (ref 0–99)
Total CHOL/HDL Ratio: 2.1 RATIO
Triglycerides: 48 mg/dL (ref <150)
VLDL: 10 mg/dL (ref 0–40)

## 2022-11-20 LAB — CBC
HCT: 32 % — ABNORMAL LOW (ref 36.0–46.0)
Hemoglobin: 11 g/dL — ABNORMAL LOW (ref 12.0–15.0)
MCH: 31.7 pg (ref 26.0–34.0)
MCHC: 34.4 g/dL (ref 30.0–36.0)
MCV: 92.2 fL (ref 80.0–100.0)
Platelets: 193 10*3/uL (ref 150–400)
RBC: 3.47 MIL/uL — ABNORMAL LOW (ref 3.87–5.11)
RDW: 13.2 % (ref 11.5–15.5)
WBC: 10.4 10*3/uL (ref 4.0–10.5)
nRBC: 0 % (ref 0.0–0.2)

## 2022-11-20 LAB — OSMOLALITY: Osmolality: 278 mOsm/kg (ref 275–295)

## 2022-11-20 LAB — SODIUM, URINE, RANDOM: Sodium, Ur: 141 mmol/L

## 2022-11-20 LAB — BRAIN NATRIURETIC PEPTIDE: B Natriuretic Peptide: 244.7 pg/mL — ABNORMAL HIGH (ref 0.0–100.0)

## 2022-11-20 LAB — OSMOLALITY, URINE: Osmolality, Ur: 597 mOsm/kg (ref 300–900)

## 2022-11-20 LAB — CREATININE, URINE, RANDOM: Creatinine, Urine: 82 mg/dL

## 2022-11-20 LAB — URIC ACID: Uric Acid, Serum: 5.9 mg/dL (ref 2.5–7.1)

## 2022-11-20 LAB — BASIC METABOLIC PANEL
Anion gap: 15 (ref 5–15)
BUN: 15 mg/dL (ref 8–23)
CO2: 21 mmol/L — ABNORMAL LOW (ref 22–32)
Calcium: 9 mg/dL (ref 8.9–10.3)
Chloride: 94 mmol/L — ABNORMAL LOW (ref 98–111)
Creatinine, Ser: 1.2 mg/dL — ABNORMAL HIGH (ref 0.44–1.00)
GFR, Estimated: 51 mL/min — ABNORMAL LOW (ref >60)
Glucose, Bld: 102 mg/dL — ABNORMAL HIGH (ref 70–99)
Potassium: 4 mmol/L (ref 3.5–5.1)
Sodium: 130 mmol/L — ABNORMAL LOW (ref 135–145)

## 2022-11-20 MED ORDER — SODIUM CHLORIDE 0.9 % IV SOLN
100.0000 mg | Freq: Two times a day (BID) | INTRAVENOUS | Status: DC
  Administered 2022-11-20 – 2022-11-21 (×3): 100 mg via INTRAVENOUS
  Filled 2022-11-20 (×4): qty 10

## 2022-11-20 MED ORDER — SODIUM CHLORIDE 0.9 % IV SOLN
200.0000 mg | Freq: Once | INTRAVENOUS | Status: AC
  Administered 2022-11-20: 200 mg via INTRAVENOUS
  Filled 2022-11-20: qty 20

## 2022-11-20 MED ORDER — OLANZAPINE 5 MG PO TABS
5.0000 mg | ORAL_TABLET | Freq: Every morning | ORAL | Status: DC
  Administered 2022-11-20 – 2022-11-23 (×3): 5 mg via ORAL
  Filled 2022-11-20 (×4): qty 1

## 2022-11-20 MED ORDER — OLANZAPINE 5 MG PO TABS: 5.0000 mg | ORAL_TABLET | Freq: Every day | ORAL | Status: DC

## 2022-11-20 MED ORDER — OLANZAPINE 5 MG PO TABS
10.0000 mg | ORAL_TABLET | Freq: Every day | ORAL | Status: DC
  Administered 2022-11-20 – 2022-11-22 (×3): 10 mg via ORAL
  Filled 2022-11-20 (×3): qty 2

## 2022-11-20 NOTE — Procedures (Addendum)
Patient Name: CINDERELLA EATON  MRN: [id number]  Epilepsy Attending: Charlsie Quest  Referring Physician/Provider: Jefferson Fuel, MD  Duration: 11/19/2022 1955 to 11/20/2022 1955  Patient history: 62yo F with h/o seizures getting eeg to evaluate for seizure.  Level of alertness: Awake, asleep  AEDs during EEG study: LCM, VPA  Technical aspects: This EEG study was done with scalp electrodes positioned according to the 10-20 International system of electrode placement. Electrical activity was reviewed with band pass filter of 1-70Hz , sensitivity of 7 uV/mm, display speed of 20mm/sec with a 60Hz  notched filter applied as appropriate. EEG data were recorded continuously and digitally stored.  Video monitoring was available and reviewed as appropriate.  Description: The posterior dominant rhythm consists of 8 Hz activity of moderate voltage (25-35 uV) seen predominantly in posterior head regions, asymmetric ( L<R) and reactive to eye opening and eye closing. Drowsiness was characterized by attenuation of the posterior background rhythm. Sleep was characterized by vertex waves, sleep spindles (12 to 14 Hz), maximal frontocentral region. EEG showed continuous 3 to 6 Hz theta-delta slowing in left heisphere. Hyperventilation and photic stimulation were not performed.     ABNORMALITY - Continuous slow, Left hemisphere - Background asymmetry, left <right  IMPRESSION: This study is suggestive of cortical dysfunction arising from left hemisphere likely secondary to underlying structural abnormality, post-ictal state. No seizures or definite epileptiform discharges were seen throughout the recording.   Domique Reardon [name]

## 2022-11-20 NOTE — Progress Notes (Signed)
Performed maintenance on leads.  All under 10.  No visible skin breakdown  

## 2022-11-20 NOTE — Progress Notes (Signed)
PROGRESS NOTE        PATIENT DETAILS Name: Cindy [name] Age: 62 y.o. Sex: female Date of Birth: February 09, 1961 Admit Date: 11/19/2022 Admitting Physician Mauricio Annett Gula, MD [id number], Julian Reil, FNP  Brief Summary: Patient is a 62 y.o.  female with history of prior CVA, seizure disorder, HTN, HLD-who presented with breakthrough seizures.  Significant events: 7/3>> admit to [facility]  Significant studies: 7/3>> MRI brain: No acute intracranial abnormality. 73>> CT angio head/neck: No LVO, no significant stenosis in the carotid arteries.  Significant microbiology data: None  Procedures: None  Consults: Neurology  Subjective: Per sister at bedside-after her seizure yesterday-speech difficulties-and right-sided weakness have developed.  Has some chronic left-sided deficits at baseline.  Objective: Vitals: Blood pressure 117/77, pulse 74, temperature 98.3 F (36.8 C), temperature source Oral, resp. rate 18, height 5\' 7"  (1.702 m), weight 71.7 kg, SpO2 97 %.   Exam: Gen Exam:Alert awake-not in any distress HEENT:atraumatic, normocephalic Chest: B/L clear to auscultation anteriorly CVS:S1S2 regular Abdomen:soft non tender, non distended Extremities:no edema Neurology: Difficult exam-moving both upper extremities-appears to have more right lower extremity weakness than left.  Speech appears dysarthric. 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  BMI: Estimated body mass index is 24.76 kg/m as calculated from the following:   Height as of this encounter: 5\' 7"  (1.702 m).   Weight as of this encounter: 71.7 kg.   Code status:   Code Status: DNR   DVT Prophylaxis: enoxaparin (LOVENOX) injection 40 mg Start: 11/19/22 1200 SCDs Start: 11/19/22 1117   Family Communication: sister at bedside  Disposition Plan: Status is: Observation The patient will require care spanning > 2 midnights and should be moved to inpatient because: Severity of illness   Planned Discharge Destination:Home health   Diet: Diet Order             DIET - DYS 1 Room service appropriate? No; Fluid consistency: Thin  Diet effective now                     Antimicrobial agents: Anti-infectives (From admission, onward)    None        MEDICATIONS: Scheduled Meds:   stroke: early stages of recovery book   Does not apply Once   aspirin EC  81 mg Oral Daily   atorvastatin  40 mg Oral QHS   enoxaparin (LOVENOX)  injection  40 mg Subcutaneous Q24H   folic acid  1 mg Oral Daily   lisinopril  5 mg Oral Daily   melatonin  3 mg Oral QHS   multivitamin with minerals  1 tablet Oral Daily   pantoprazole  40 mg Oral Daily   Continuous Infusions:  lacosamide (VIMPAT) IV 100 mg (11/20/22 0942)   valproate sodium 500 mg (11/19/22 2343)   PRN Meds:.acetaminophen **OR** acetaminophen, ondansetron **OR** ondansetron (ZOFRAN) IV   I have personally reviewed following labs and imaging studies  LABORATORY DATA: CBC: Recent Labs  Lab 11/19/22 0744 11/20/22 0207  WBC 9.7 10.4  NEUTROABS  7.6  --   HGB 11.6* 11.0*  HCT 34.5* 32.0*  MCV 95.0 92.2  PLT 200 193    Basic Metabolic Panel: Recent Labs  Lab 11/19/22 0744 11/20/22 0207  NA 130* 130*  K 4.8 4.0  CL 99 94*  CO2 22 21*  GLUCOSE 112* 102*  BUN 18 15  CREATININE 1.28* 1.20*  CALCIUM 9.0 9.0  MG 1.7  --     GFR: Estimated Creatinine Clearance: 46.7 mL/min (A) (by C-G formula based on SCr of 1.2 mg/dL (H)).  Liver Function Tests: Recent Labs  Lab 11/19/22 0744  AST 21  ALT 12  ALKPHOS 88  BILITOT 0.5  PROT 7.5  ALBUMIN 3.2*   No results for input(s): "LIPASE", "AMYLASE" in the last 168 hours. No results for input(s): "AMMONIA" in the last 168 hours.  Coagulation Profile: No results for input(s): "INR", "PROTIME" in the last 168 hours.  Cardiac Enzymes: Recent Labs  Lab 11/19/22 0744  CKTOTAL 114    BNP (last 3 results) No results for input(s): "PROBNP" in the last 8760 hours.  Lipid Profile: Recent Labs    11/20/22 0207  CHOL 105  HDL 51  LDLCALC 44  TRIG 48  CHOLHDL 2.1    Thyroid Function Tests: No results for input(s): "TSH", "T4TOTAL", "FREET4", "T3FREE", "THYROIDAB" in the last 72 hours.  Anemia Panel: No results for input(s): "VITAMINB12", "FOLATE", "FERRITIN", "TIBC", "IRON", "RETICCTPCT" in the last 72 hours.  Urine analysis:    Component Value Date/Time   COLORURINE YELLOW 11/20/2022 0645   APPEARANCEUR CLOUDY (A) 11/20/2022 0645   LABSPEC 1.033 (H) 11/20/2022 0645   PHURINE 7.0 11/20/2022 0645   GLUCOSEU NEGATIVE 11/20/2022 0645   HGBUR MODERATE (A) 11/20/2022 0645   BILIRUBINUR NEGATIVE 11/20/2022 0645   KETONESUR NEGATIVE 11/20/2022 0645   PROTEINUR NEGATIVE 11/20/2022 0645   NITRITE NEGATIVE 11/20/2022 0645   LEUKOCYTESUR SMALL (A) 11/20/2022 0645    Sepsis Labs: Lactic Acid, Venous    Component Value Date/Time   LATICACIDVEN 1.3 08/23/2020 1958    MICROBIOLOGY: No results found for this or any previous visit (from the past 240  hour(s)).  RADIOLOGY STUDIES/RESULTS: Overnight EEG with video  Result Date: 11/20/2022 Charlsie Quest, MD     11/20/2022  9:03 AM Patient Name: Cindy [name] MRN: [id number] Epilepsy Attending: Charlsie Quest Referring Physician/Provider: Jefferson Fuel, MD Duration: 11/19/2022 1955 to 11/20/2022 0900 Patient history: 62yo F with h/o seizures getting eeg to evaluate for seizure. Level of alertness: Awake, asleep AEDs during EEG study: LCM, VPA Technical aspects: This EEG study was done with scalp electrodes positioned according to the 10-20 International system of electrode placement. Electrical activity was reviewed with band pass filter of 1-70Hz , sensitivity of 7 uV/mm, display speed of 74mm/sec with a 60Hz  notched filter applied as appropriate. EEG data were recorded continuously and  digitally stored.  Video monitoring was available and reviewed as appropriate. Description: The posterior dominant rhythm consists of 8 Hz activity of moderate voltage (25-35 uV) seen predominantly in posterior head regions, asymmetric ( L<R) and reactive to eye opening and eye closing. Drowsiness was characterized by attenuation of the posterior background rhythm. Sleep was characterized by vertex waves, sleep spindles (12 to 14 Hz), maximal frontocentral region. EEG showed continuous 3 to 6 Hz theta-delta slowing in left heisphere. Hyperventilation and photic stimulation were not performed.   ABNORMALITY - Continuous slow, Left hemisphere - Background asymmetry, left <right IMPRESSION: This study is suggestive of cortical dysfunction arising from left hemisphere likely secondary to underlying structural abnormality, post-ictal state. No seizures or definite epileptiform discharges were seen throughout the recording. Charlsie Quest   ECHOCARDIOGRAM COMPLETE  Result Date: 11/19/2022    ECHOCARDIOGRAM REPORT   Patient Name:   Cindy [name] Date of Exam: 11/19/2022 Medical Rec #:  [id number]    Height:       67.0 in Accession #:     [id number]   Weight:       158.1 lb Date of Birth:  Aug 09, 1960   BSA:          1.830 m Patient Age:    61 years     BP:           147/86 mmHg Patient Gender: F            HR:           92 bpm. [name] Procedure: 2D Echo, Cardiac Doppler and Color Doppler Indications:    TIA G45.9  History:        Patient has prior history of Echocardiogram examinations, most                 recent 08/06/2019. Stroke; Risk Factors:Hypertension and                 Dyslipidemia. ALCOHOL USE, Cocaine abuse (HCC).  Sonographer:    Celesta Gentile RCS Referring Phys: [id number] MAURICIO DANIEL ARRIEN IMPRESSIONS  1. Left ventricular ejection fraction, by estimation, is 65 to 70%. The left ventricle has normal function. The left ventricle has no regional wall motion abnormalities. There is mild left ventricular hypertrophy. Left ventricular diastolic parameters are consistent with Grade I diastolic dysfunction (impaired relaxation).  2. Right ventricular systolic function is normal. The right ventricular size is normal.  3. The mitral valve is normal in structure. No evidence of mitral valve regurgitation. No evidence of mitral stenosis.  4. The aortic valve is tricuspid. Aortic valve regurgitation is not visualized. No aortic stenosis is present.  5. The inferior vena cava is normal in size with greater than 50% respiratory variability, suggesting right atrial pressure of 3 mmHg. FINDINGS  Left Ventricle: Left ventricular ejection fraction, by estimation, is 65 to 70%. The left ventricle has normal function. The left ventricle has no regional wall motion abnormalities. Definity contrast agent was given IV to delineate the left ventricular  endocardial borders. The left ventricular internal cavity size was normal in size. There is mild left ventricular hypertrophy. Left ventricular diastolic parameters are consistent with Grade I diastolic dysfunction (impaired relaxation). Normal left ventricular filling pressure. Right  Ventricle: The right ventricular size is normal. Right vetricular wall thickness was not well visualized. Right ventricular systolic function is normal. Left Atrium: Left atrial size was normal in size. Right Atrium: Right atrial size was normal in size. Pericardium:  There is no evidence of pericardial effusion. Mitral Valve: The mitral valve is normal in structure. No evidence of mitral valve regurgitation. No evidence of mitral valve stenosis. Tricuspid Valve: The tricuspid valve is normal in structure. Tricuspid valve regurgitation is not demonstrated. No evidence of tricuspid stenosis. Aortic Valve: The aortic valve is tricuspid. Aortic valve regurgitation is not visualized. No aortic stenosis is present. Aortic valve mean gradient measures 2.7 mmHg. Aortic valve peak gradient measures 5.5 mmHg. Aortic valve area, by VTI measures 2.15 cm. Pulmonic Valve: The pulmonic valve was not well visualized. Pulmonic valve regurgitation is not visualized. No evidence of pulmonic stenosis. Aorta: The aortic root is normal in size and structure. Venous: The inferior vena cava is normal in size with greater than 50% respiratory variability, suggesting right atrial pressure of 3 mmHg. IAS/Shunts: No atrial level shunt detected by color flow Doppler.  LEFT VENTRICLE PLAX 2D LVIDd:         3.70 cm   Diastology LVIDs:         1.90 cm   LV e' medial:    5.98 cm/s LV PW:         1.20 cm   LV E/e' medial:  7.8 LV IVS:        1.10 cm   LV e' lateral:   7.40 cm/s LVOT diam:     1.60 cm   LV E/e' lateral: 6.3 LV SV:         46 LV SV Index:   25 LVOT Area:     2.01 cm  RIGHT VENTRICLE RV S prime:     16.30 cm/s TAPSE (M-mode): 2.0 cm LEFT ATRIUM             Index        RIGHT ATRIUM          Index LA diam:        2.30 cm 1.26 cm/m   RA Area:     8.50 cm LA Vol (A2C):   23.5 ml 12.84 ml/m  RA Volume:   15.60 ml 8.52 ml/m LA Vol (A4C):   28.7 ml 15.68 ml/m LA Biplane Vol: 27.9 ml 15.25 ml/m  AORTIC VALVE AV Area (Vmax):    1.79  cm AV Area (Vmean):   2.06 cm AV Area (VTI):     2.15 cm AV Vmax:           117.71 cm/s AV Vmean:          75.413 cm/s AV VTI:            0.212 m AV Peak Grad:      5.5 mmHg AV Mean Grad:      2.7 mmHg LVOT Vmax:         105.00 cm/s LVOT Vmean:        77.200 cm/s LVOT VTI:          0.227 m LVOT/AV VTI ratio: 1.07  AORTA Ao Root diam: 3.40 cm MITRAL VALVE MV Area (PHT): 5.38 cm    SHUNTS MV Decel Time: 141 msec    Systemic VTI:  0.23 m MV E velocity: 46.60 cm/s  Systemic Diam: 1.60 cm MV A velocity: 86.40 cm/s MV E/A ratio:  0.54 Dina Rich MD Electronically signed by Dina Rich MD Signature Date/Time: 11/19/2022/3:22:38 PM    Final    MR Brain W and Wo Contrast  Result Date: 11/19/2022 CLINICAL DATA:  Provided history: Neuro deficit, acute, stroke suspected. EXAM: MRI HEAD WITHOUT  AND WITH CONTRAST TECHNIQUE: Multiplanar, multiecho pulse sequences of the brain and surrounding structures were obtained without and with intravenous contrast. CONTRAST:  7mL GADAVIST GADOBUTROL 1 MMOL/ML IV SOLN COMPARISON:  Non-contrast head CT and CT angiogram head/neck 11/19/2022. Brain MRI 08/28/2020. FINDINGS: Intermittently motion degraded examination. Most notably, the axial T1-weighted sequence is moderate-to-severely motion degraded, the axial T1-weighted post-contrast sequence is severely motion degraded, the coronal T1-weighted post-contrast sequence is moderate-to-severely motion degraded and the sagittal T1-weighted post-contrast sequence is severely motion degraded. Within this limitation, findings are as follows. Brain: Generalized cerebral atrophy which is significantly advanced for age. Mild-to-moderate cerebellar atrophy. Commensurate prominence of the ventricles and sulci. Chronic lacunar infarcts within the bilateral cerebral hemispheric white matter and bilateral deep gray nuclei. Background advanced patchy and confluent T2 FLAIR hyperintense signal abnormality within the cerebral white matter. Small  chronic infarct within the medial left cerebellar hemisphere. There is no acute infarct. No evidence of an intracranial mass. No chronic intracranial blood products. No extra-axial fluid collection. No midline shift. No definite pathologic intracranial enhancement is identified, although evaluation is markedly limited due to motion degradation of the post-contrast imaging. Vascular: Maintained flow voids within the proximal large arterial vessels. Skull and upper cervical spine: No focal suspicious marrow lesion. Incompletely assessed cervical spondylosis. Sinuses/Orbits: No mass or acute finding within the imaged orbits. Mild mucosal thickening within the right maxillary sinus. Other: Small-volume fluid within the bilateral mastoid air cells. IMPRESSION: 1. Significantly motion degraded examination, as described. 2. Within this limitation, no acute intracranial abnormality is identified. The diffusion-weighted imaging is of good quality and there is no evidence of an acute infarct. 3. Redemonstrated chronic lacunar infarcts within the bilateral cerebral hemispheric white matter and within the bilateral deep gray nuclei. 4. Redemonstrated small chronic infarct within the left cerebellar hemisphere. 5. Background advanced cerebral white matter disease, nonspecific but favored to reflect sequelae of chronic small vessel ischemia (given the presence of multiple chronic infarcts within the supratentorial and infratentorial brain). 6. Cerebral and cerebellar atrophy, significantly advanced for age. 7. Mild right maxillary sinus mucosal thickening. 8. Small-volume fluid within the bilateral mastoid air cells. Electronically Signed   By: Jackey Loge D.O.   On: 11/19/2022 12:44   CT ANGIO HEAD NECK W WO CM  Result Date: 11/19/2022 CLINICAL DATA:  Neuro deficit, acute, stroke suspected. Seizure presentation. EXAM: CT ANGIOGRAPHY HEAD AND NECK WITH AND WITHOUT CONTRAST TECHNIQUE: Multidetector CT imaging of the head and  neck was performed using the standard protocol during bolus administration of intravenous contrast. Multiplanar CT image reconstructions and MIPs were obtained to evaluate the vascular anatomy. Carotid stenosis measurements (when applicable) are obtained utilizing NASCET criteria, using the distal internal carotid diameter as the denominator. RADIATION DOSE REDUCTION: This exam was performed according to the departmental dose-optimization program which includes automated exposure control, adjustment of the mA and/or kV according to patient size and/or use of iterative reconstruction technique. CONTRAST:  75mL OMNIPAQUE IOHEXOL 350 MG/ML SOLN COMPARISON:  Head CT same day.  MRI 08/28/2020. FINDINGS: CTA NECK FINDINGS Aortic arch: Aortic arch appears normal. Right carotid system: Common carotid artery widely patent to the bifurcation. Mild atherosclerotic plaque at the carotid bifurcation and ICA bulb but no stenosis. Cervical ICA beyond that is tortuous but widely patent. Left carotid system: Common carotid artery widely patent to the bifurcation. Atherosclerotic plaque at the bifurcation and ICA bulb but no stenosis. Cervical ICA beyond that is tortuous but widely patent. Vertebral arteries: Both vertebral artery origins are widely  patent. Both vertebral arteries appear normal through the cervical region to the foramen magnum. Skeleton: Ordinary mid cervical degenerative spondylosis. Other neck: No mass or lymphadenopathy. Upper chest: Lung apices are clear. Review of the MIP images confirms the above findings CTA HEAD FINDINGS Anterior circulation: Both internal carotid arteries are patent through the skull base and siphon regions. Ordinary siphon atherosclerotic calcification but no stenosis. The anterior and middle cerebral vessels are patent. No large vessel occlusion or proximal stenosis. 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 No aneurysm or vascular malformation. Posterior circulation: Both vertebral  arteries are patent to the basilar artery. There is atherosclerotic calcification in both vertebral artery V4 segments with narrowing estimated at 30-50% on both sides. No basilar stenosis. Superior cerebellar and posterior cerebral arteries appear normal. Venous sinuses: Patent and normal. Anatomic variants: None significant otherwise. Review of the MIP images confirms the above findings IMPRESSION: 1. No large vessel occlusion. 2. Atherosclerotic disease at both carotid bifurcations but without stenosis. 3. 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 Electronically Signed   By: Paulina Fusi M.D.   On: 11/19/2022 10:52   CT HEAD WO CONTRAST  Result Date: 11/19/2022 CLINICAL DATA:  Mental status change, unknown cause EXAM: CT HEAD WITHOUT CONTRAST TECHNIQUE: Contiguous axial images were obtained from the base of the skull through the vertex without intravenous contrast. RADIATION DOSE REDUCTION: This exam was performed according to the departmental dose-optimization program which includes automated exposure control, adjustment of the mA and/or kV according to patient size and/or use of iterative reconstruction technique. COMPARISON:  CT Head 08/23/20, Brain MR 08/23/20, 08/28/20 FINDINGS: Brain: No evidence of acute infarction, hemorrhage, hydrocephalus, extra-axial collection or mass lesion/mass effect. There is sequela moderate chronic microvascular ischemic change with advanced generalized volume loss. Chronic right thalamic infarct chronic infarct in the right basal ganglia. Vascular: No hyperdense vessel or unexpected calcification. Skull: Normal. Negative for fracture or focal lesion. Sinuses/Orbits: No middle ear or mastoid effusion. Paranasal sinuses are notable for mucosal thickening in the right maxillary sinus. Orbits are unremarkable. Other: None. IMPRESSION: No acute intracranial abnormality. Electronically Signed   By: Lorenza Cambridge M.D.    On: 11/19/2022 08:47   DG Chest Port 1 View  Result Date: 11/19/2022 CLINICAL DATA:  Provided history: Altered mental status.  Seizure. EXAM: PORTABLE CHEST 1 VIEW COMPARISON:  Prior chest radiographs 09/02/2020 and earlier. FINDINGS: Shallow inspiration radiograph with accentuation of the cardiac silhouette. Taking this into account, heart size appears at the upper limits of normal. No appreciable airspace consolidation or pulmonary edema. No evidence of pleural effusion or pneumothorax. No acute osseous abnormality identified. IMPRESSION: Shallow inspiration radiograph. No evidence of an acute cardiopulmonary abnormality. Electronically Signed   By: Jackey Loge D.O.   On: 11/19/2022 08:11     LOS: 0 days   Jeoffrey Massed, MD  [facility]    To contact the attending provider between 7A-7P or the covering provider during after hours 7P-7A, please log into the web site [email or web address] and access using universal [place] password for that web site. If you do not have the password, please call the hospital operator.  11/20/2022, 10:18 AM

## 2022-11-20 NOTE — Consult Note (Signed)
NEUROLOGY CONSULTATION NOTE   Date of service: November 20, 2022 Patient Name: Cindy [name] MRN:  [id number] DOB:  July 09, 1960 Reason for consult: "seizure, prolonged post ictal" Requesting Provider: Coralie Keens,* _ _ _   _ __   _ __ _ _  __ __   _ __   __ _  History of Present Illness  Cindy [name] is a 62 y.o. female with PMH significant for prior stroke, HTN, HLD, remote substance use, seizures on Depakote and Vimpat who presented to [facility] from home with a prolonged seizure lasting 20-25 mins.  She went to sleep on 11/18/22 and in AM, was heard making loud noises and was found to be having GTC seizure by family. She was unresponsive during this event. EMS called and was given Versed with improvement and she was transferred to [facility].  At baseline, normally awake and conversant and able to feed herself. She can assist with getting herself up and walk a little bit with walker. She was supposed to be on both depakote and vimpat but was apparently taken off of Vimpat for unclear reasons and has only been on depakote.  She was confused and somnolent/post ictal in the ED and was discussed with neurology and felt she would be better served at [facility] for LTM to rule out ongoing seizures.  MRI brain w + w/o C with no acute abnormalities. Noted chronic strokes.   ROS   Unable to get detailed ROS 2/2 encephalopathy.  Past History   Past Medical History:  Diagnosis Date   Abnormal LFTs    Hx of   CVA (cerebral vascular accident) (HCC) 2005   Hyperlipidemia    Hypertension    Polysubstance dependence including opioid type drug, episodic abuse (HCC)    cocaine/etoh    Seizures (HCC)    Past Surgical History:  Procedure Laterality Date   ESOPHAGOGASTRODUODENOSCOPY N/A 08/16/2019   Procedure: ESOPHAGOGASTRODUODENOSCOPY (EGD);  Surgeon: Vida Rigger, MD;  Location: [facility] ENDOSCOPY;  Service: Endoscopy;  Laterality: N/A;   Family History  Problem Relation Age of Onset   Cancer Father         prostate    Hypertension Mother    Asthma Mother    Bronchitis Mother    Social History   Socioeconomic History   Marital status: Married    Spouse name: Not on file   Number of children: Not on file   Years of education: Not on file   Highest education level: Not on file  Occupational History   Not on file  Tobacco Use   Smoking status: Never   Smokeless tobacco: Never  Vaping Use   Vaping Use: Never used  Substance and Sexual Activity   Alcohol use: Yes    Comment: Drinks three 40 ounces beers 3 days per week , other days none to one 40 ounce beer per day   Drug use: Yes    Types: Cocaine    Comment: not now   Sexual activity: Yes    Birth control/protection: Post-menopausal  Other Topics Concern   Not on file  Social History Narrative   Not on file   Social Determinants of Health   Financial Resource Strain: Not on file  Food Insecurity: Patient Unable To Answer (11/20/2022)   Hunger Vital Sign    Worried About Running Out of Food in the Last Year: Patient unable to answer    Ran Out of Food in the Last Year: Patient unable to answer  Transportation Needs: Patient Unable To Answer (11/20/2022)   PRAPARE - Administrator, Civil Service (Medical): Patient unable to answer    Lack of Transportation (Non-Medical): Patient unable to answer  Physical Activity: Not on file  Stress: Not on file  Social Connections: Not on file   No Known Allergies  Medications   Medications Prior to Admission  Medication Sig Dispense Refill Last Dose   acetaminophen (TYLENOL) 325 MG tablet Take 2 tablets (650 mg total) by mouth every 6 (six) hours as needed (pain).   11/18/2022   atorvastatin (LIPITOR) 40 MG tablet Take 1 tablet (40 mg total) by mouth at bedtime. 30 tablet 1 11/18/2022   divalproex (DEPAKOTE) 500 MG DR tablet Take 1 tablet (500 mg total) by mouth every 8 (eight) hours. 90 tablet 0 11/18/2022   folic acid (FOLVITE) 1 MG tablet Take 1 tablet (1 mg total) by  mouth daily. 30 tablet 1 11/18/2022   lisinopril (ZESTRIL) 5 MG tablet Take 2.5 mg by mouth daily.   11/18/2022   melatonin 3 MG TABS tablet Take 1 tablet (3 mg total) by mouth at bedtime. 30 tablet 0 11/18/2022   pantoprazole (PROTONIX) 40 MG tablet Take 1 tablet (40 mg total) by mouth daily. 30 tablet 0 11/18/2022   amLODipine (NORVASC) 10 MG tablet Take 1 tablet (10 mg total) by mouth daily. (Patient not taking: Reported on 11/19/2022) 30 tablet 0 Not Taking   aspirin 325 MG tablet Take 1 tablet (325 mg total) by mouth daily. (Patient not taking: Reported on 11/19/2022)   Not Taking   lacosamide (VIMPAT) 200 MG TABS tablet Take 1 tablet (200 mg total) by mouth 2 (two) times daily. (Patient not taking: Reported on 11/19/2022) 60 tablet 1 Not Taking   metoprolol tartrate (LOPRESSOR) 25 MG tablet Take 1 tablet (25 mg total) by mouth 2 (two) times daily. (Patient not taking: Reported on 11/19/2022) 60 tablet 1 Not Taking   Multiple Vitamin (MULTIVITAMIN WITH MINERALS) TABS tablet Take 1 tablet by mouth daily. (Patient not taking: Reported on 11/19/2022)   Not Taking   OLANZapine (ZYPREXA) 2.5 MG tablet take 1 tablet by mouth daily and  take 2 tablets at bedtime (Patient not taking: Reported on 11/19/2022) 77 tablet 0 Not Taking     Vitals   Vitals:   11/19/22 1400 11/19/22 1636 11/19/22 2100 11/19/22 2300  BP: 133/68 138/72 (!) 151/67 118/74  Pulse: 80 85 81 76  Resp: 16 17 20 16   Temp:  98 F (36.7 C) 98.6 F (37 C) 97.8 F (36.6 C)  TempSrc:  Oral Oral Oral  SpO2: 95% 98% 98% 95%  Weight:      Height:         Body mass index is 24.76 kg/m.  Physical Exam   General: Laying comfortably in bed; in no acute distress.  HENT: Normal oropharynx and mucosa. Normal external appearance of ears and nose.  Neck: Supple, no pain or tenderness  CV: No JVD. No peripheral edema.  Pulmonary: Symmetric Chest rise. Normal respiratory effort.  Abdomen: Soft to touch, non-tender.  Ext: No cyanosis, edema, or  deformity  Skin: No rash. Normal palpation of skin.   Musculoskeletal: Normal digits and nails by inspection. No clubbing.   Neurologic Examination  Mental status/Cognition: Alert, oriented to self, Speech/language: dysarthric speech, non fluent, able to follow some simple commands.  CN II Pupils equal and reactive to light, no VF deficits    CN III,IV,VI EOM intact, no  gaze preference or deviation, no nystagmus    CN V normal sensation in V1, V2, and V3 segments bilaterally    CN VII no asymmetry, no nasolabial fold flattening    CN VIII normal hearing to speech    CN IX & X normal palatal elevation, no uvular deviation    CN XI 5/5 head turn and 5/5 shoulder shrug bilaterally    CN XII midline tongue protrusion    Motor:  Muscle bulk: poor, tone normal. Prefers to use left upper ext and left lower ext more so than right upper ext and right lower ext.  Sensation:  Light touch Grossly intact BL   Pin prick    Temperature    Vibration   Proprioception    Coordination/Complex Motor:  - Unable to assess due to encephalopathy.  Labs   CBC:  Recent Labs  Lab 11/19/22 0744 11/20/22 0207  WBC 9.7 10.4  NEUTROABS 7.6  --   HGB 11.6* 11.0*  HCT 34.5* 32.0*  MCV 95.0 92.2  PLT 200 193    Basic Metabolic Panel:  Lab Results  Component Value Date   NA 130 (L) 11/19/2022   K 4.8 11/19/2022   CO2 22 11/19/2022   GLUCOSE 112 (H) 11/19/2022   BUN 18 11/19/2022   CREATININE 1.28 (H) 11/19/2022   CALCIUM 9.0 11/19/2022   GFRNONAA 47 (L) 11/19/2022   GFRAA >60 02/16/2020   Lipid Panel:  Lab Results  Component Value Date   LDLCALC 46 08/11/2019   HgbA1c:  Lab Results  Component Value Date   HGBA1C 6.4 (H) 11/19/2022   Urine Drug Screen:     Component Value Date/Time   LABOPIA NONE DETECTED 11/19/2022 0955   COCAINSCRNUR NONE DETECTED 11/19/2022 0955   LABBENZ POSITIVE (A) 11/19/2022 0955   AMPHETMU NONE DETECTED 11/19/2022 0955   THCU NONE DETECTED 11/19/2022  0955   LABBARB NONE DETECTED 11/19/2022 0955    Alcohol Level     Component Value Date/Time   ETH <10 11/19/2022 0744    CT Head without contrast(Personally reviewed): CTH was negative for a large hypodensity concerning for a large territory infarct or hyperdensity concerning for an ICH  CT angio Head and Neck with contrast(Personally reviewed): No LVO  MRI Brain(Personally reviewed): No acute stroke  cEEG:  pending  Impression   Cindy [name] is a 62 y.o. female  with PMH significant for prior stroke, HTN, HLD, remote substance use, seizures on Depakote and Vimpat who presented to [facility] from home with a prolonged seizure lasting 20-25 mins. Improving mentation here but still prefers to use LUE and LLE more so than RUE and RLE.  Sister at bedside reports that she has been on depakote only at home. On chart review, seems to have been discharged on both VPA and Vimpat but Vimpat was discontinue at somepoint for unclear reason.  Recommendations  - LTM EEG - continue Depakote - restart vimpat at 100mg  BID. ______________________________________________________________________   Thank you for the opportunity to take part in the care of this patient. If you have any further questions, please contact the neurology consultation attending.  Signed,  Erick Blinks [facility] _ _ _   _ __   _ __ _ _  __ __   _ __   __ _

## 2022-11-20 NOTE — Evaluation (Signed)
Occupational Therapy Evaluation Patient Details Name: Cindy [name] MRN: [id number] DOB: 01-11-1961 Today's Date: 11/20/2022   History of Present Illness Pt is a 62 year old woman admitted on 7/3 after a prolonged seizures lasting 20-25 minutes. Transferred from [facility] to [facility]. PMH: seizures, CVA, HTN, HLD, remote substance abuse.   Clinical Impression   Pt lives with her sister and brother in law. She is assisted for sponge bathing and toileting, but can typically dress herself at bed level, groom and self feed with set up. She transfers with her sister stabilizing RW to [facility] and wheelchair. Wheelchair is her primary means of mobility. Pt is largely non verbal at baseline. Pt presents with generalized weakness, R UE weaker than L, head rotation to L with L gaze, impaired cognition and decreased standing balance. She requires +2 assist for all mobility and was able to stand with flexed posture with +2 mod assist. She is dependent in all ADLs. Sister prefers to take pt home even if she requires hoyer lift. Recommending HHOT.      Recommendations for follow up therapy are one component of a multi-disciplinary discharge planning process, led by the attending physician.  Recommendations may be updated based on patient status, additional functional criteria and insurance authorization.   Assistance Recommended at Discharge Frequent or constant Supervision/Assistance  Patient can return home with the following Two people to help with walking and/or transfers;A lot of help with bathing/dressing/bathroom;Assistance with cooking/housework;Assistance with feeding;Direct supervision/assist for medications management;Direct supervision/assist for financial management;Assist for transportation;Help with stairs or ramp for entrance    Functional Status Assessment  Patient has had a recent decline in their functional status and demonstrates the ability to make significant improvements in function in a reasonable  and predictable amount of time.  Equipment Recommendations  Other (comment) (hoyer lift)    Recommendations for Other Services       Precautions / Restrictions Precautions Precautions: Fall;Other (comment) Precaution Comments: seizures      Mobility Bed Mobility Overal bed mobility: Needs Assistance Bed Mobility: Rolling, Supine to Sit, Sit to Supine Rolling: Mod assist   Supine to sit: +2 for physical assistance, Max assist Sit to supine: +2 for physical assistance, Max assist   General bed mobility comments: rolled for pericare and LB dressing, assist to raise trunk and for LEs over EOB, guided trunk and assisted LEs back into bed    Transfers Overall transfer level: Needs assistance Equipment used: Rolling walker (2 wheels) Transfers: Sit to/from Stand Sit to Stand: +2 physical assistance, Mod assist           General transfer comment: hands placed on walker simulating how sister assists pt at home, assist to rise and steady, flexed posture, pt unable side step      Balance Overall balance assessment: Needs assistance   Sitting balance-Leahy Scale: Fair     Standing balance support: Bilateral upper extremity supported Standing balance-Leahy Scale: Poor                             ADL either performed or assessed with clinical judgement   ADL                                         General ADL Comments: total assist, unable to use B UEs effectively     Vision Baseline Vision/History:  1 Wears glasses Ability to See in Adequate Light: 1 Impaired Additional Comments: L eye gaze     Perception     Praxis      Pertinent Vitals/Pain Pain Assessment Pain Assessment: Faces Faces Pain Scale: No hurt     Hand Dominance Right   Extremity/Trunk Assessment Upper Extremity Assessment Upper Extremity Assessment: RUE deficits/detail;LUE deficits/detail;Difficult to assess due to impaired cognition RUE Deficits / Details: can  perform hand to mouth, assist to place on walker RUE Coordination: decreased fine motor;decreased gross motor LUE Deficits / Details: generalized weakness LUE Coordination: decreased fine motor;decreased gross motor           Communication Communication Communication: Expressive difficulties   Cognition Arousal/Alertness: Awake/alert Behavior During Therapy: Flat affect Overall Cognitive Status: Impaired/Different from baseline Area of Impairment: Following commands                       Following Commands: Follows one step commands with increased time       General Comments: pt needing multimodal cues to follow commands, sister providing home information and PLOF, pt primarily says "ok"     General Comments       Exercises     Shoulder Instructions      Home Living Family/patient expects to be discharged to:: Private residence Living Arrangements: Other relatives (sister and brother in law) Available Help at Discharge: Family;Available 24 hours/day Type of Home: House Home Access: Ramped entrance     Home Layout: Able to live on main level with bedroom/bathroom     Bathroom Shower/Tub: Sponge bathes at baseline         Home Equipment: Agricultural consultant (2 wheels);Wheelchair - manual;BSC/3in1;Hospital bed;Other (comment) (gait belt)          Prior Functioning/Environment Prior Level of Function : Needs assist             Mobility Comments: transfers with +1 assist and RW, w/c for mobility ADLs Comments: self feeds, dresses at bed level, assisted for bathing and all IADL        OT Problem List: Decreased strength;Decreased activity tolerance;Impaired balance (sitting and/or standing);Decreased cognition;Decreased coordination;Impaired UE functional use      OT Treatment/Interventions: Self-care/ADL training;DME and/or AE instruction;Therapeutic activities;Cognitive remediation/compensation;Patient/family education;Balance training;Therapeutic  exercise    OT Goals(Current goals can be found in the care plan section) Acute Rehab OT Goals OT Goal Formulation: With family Time For Goal Achievement: 12/04/22 Potential to Achieve Goals: Good  OT Frequency: Min 1X/week    Co-evaluation PT/OT/SLP Co-Evaluation/Treatment: Yes Reason for Co-Treatment: Complexity of the patient's impairments (multi-system involvement)   OT goals addressed during session: ADL's and self-care      AM-PAC OT "6 Clicks" Daily Activity     Outcome Measure Help from another person eating meals?: Total Help from another person taking care of personal grooming?: Total Help from another person toileting, which includes using toliet, bedpan, or urinal?: Total Help from another person bathing (including washing, rinsing, drying)?: Total Help from another person to put on and taking off regular upper body clothing?: Total Help from another person to put on and taking off regular lower body clothing?: Total 6 Click Score: 6   End of Session Equipment Utilized During Treatment: Rolling walker (2 wheels);Gait belt Nurse Communication: Mobility status;Other (comment) (aware of BM)  Activity Tolerance: Patient tolerated treatment well Patient left: in bed;with call bell/phone within reach;with bed alarm set;with family/visitor present  OT Visit Diagnosis: Unsteadiness on feet (R26.81);Muscle  weakness (generalized) (M62.81);Other symptoms and signs involving cognitive function                Time: 1355-1431 OT Time Calculation (min): 36 min Charges:  OT General Charges $OT Visit: 1 Visit OT Evaluation $OT Eval Moderate Complexity: 1 Mod Berna Spare, OTR/L Acute Rehabilitation Services Office: [phone]  Evern Bio 11/20/2022, 4:15 PM

## 2022-11-20 NOTE — Evaluation (Signed)
Clinical/Bedside Swallow Evaluation Patient Details  Name: Cindy [name] MRN: [id number] Date of Birth: 1960-06-29  Today's Date: 11/20/2022 Time: SLP Start Time (ACUTE ONLY): 0831 SLP Stop Time (ACUTE ONLY): 0845 SLP Time Calculation (min) (ACUTE ONLY): 14 min  Past Medical History:  Past Medical History:  Diagnosis Date   Abnormal LFTs    Hx of   CVA (cerebral vascular accident) (HCC) 2005   Hyperlipidemia    Hypertension    Polysubstance dependence including opioid type drug, episodic abuse (HCC)    cocaine/etoh    Seizures (HCC)    Past Surgical History:  Past Surgical History:  Procedure Laterality Date   ESOPHAGOGASTRODUODENOSCOPY N/A 08/16/2019   Procedure: ESOPHAGOGASTRODUODENOSCOPY (EGD);  Surgeon: Vida Rigger, MD;  Location: [facility] ENDOSCOPY;  Service: Endoscopy;  Laterality: N/A;   HPI:  Cindy [name] is a 62 y.o. female with PMH significant for prior stroke, HTN, HLD, remote substance use, seizures who presented to [facility] from home with a prolonged seizure lasting 20-25 mins. Per chart at baseline, normally awake and conversant and able to feed herself.   MRI brain w + w/o C with no acute abnormalities. BSE 08/2020 on Dys 3/thin    Assessment / Plan / Recommendation  Clinical Impression  Pt is altered per sister at bedside. Pt able to follow command to open her mouth but not request for labial or lingual movements. She has upper plate (no lower) and was donned. Sister states she eats regular food at home and will occasionaly cough with her oatmeal at home. Pt assisted in holding cup with straw sips thin and consumed applesauce without s/s aspiration. Due to cognitive status she had reduced mastication and only attempted to chew several times before transiting and swallowing with immediate cough. Several coughs at the end of the session. Sister states she has phlegm and staff at [facility] stated she may have aspirated during her seizure. Recommend puree diet (Dys 1), thin liquids. Pt's  sister stated she took her pills whole with water last  night without difficulty. Recommend pills with water and if difficulty, can place whole in puree. ST will continue to follow for efficiency and ability to upgrade diet once mentation improves. SLP Visit Diagnosis: Dysphagia, unspecified (R13.10)    Aspiration Risk  Mild aspiration risk    Diet Recommendation Dysphagia 1 (Puree);Thin liquid    Liquid Administration via: Straw;Cup Medication Administration: Whole meds with liquid Supervision: Staff to assist with self feeding;Full supervision/cueing for compensatory strategies Compensations: Slow rate;Small sips/bites Postural Changes: Seated upright at 90 degrees    Other  Recommendations Oral Care Recommendations: Oral care BID    Recommendations for follow up therapy are one component of a multi-disciplinary discharge planning process, led by the attending physician.  Recommendations may be updated based on patient status, additional functional criteria and insurance authorization.  Follow up Recommendations  (TBD)      Assistance Recommended at Discharge    Functional Status Assessment Patient has had a recent decline in their functional status and demonstrates the ability to make significant improvements in function in a reasonable and predictable amount of time.  Frequency and Duration min 2x/week  2 weeks       Prognosis Prognosis for improved oropharyngeal function: Good Barriers to Reach Goals: Cognitive deficits      Swallow Study   General Date of Onset: 11/19/22 HPI: Cindy [name] is a 62 y.o. female with PMH significant for prior stroke, HTN, HLD, remote substance use, seizures who presented to  [facility] from home with a prolonged seizure lasting 20-25 mins. Per chart at baseline, normally awake and conversant and able to feed herself.   MRI brain w + w/o C with no acute abnormalities. BSE 08/2020 on Dys 3/thin Type of Study: Bedside Swallow Evaluation Previous Swallow  Assessment:  (see HPI) Diet Prior to this Study: NPO Temperature Spikes Noted: No Respiratory Status: Room air History of Recent Intubation: No Behavior/Cognition: Alert;Cooperative;Pleasant mood;Confused;Requires cueing Oral Cavity Assessment: Within Functional Limits Oral Care Completed by SLP: No Oral Cavity - Dentition: Dentures, top (no lower dentition or dentures) Vision: Functional for self-feeding Self-Feeding Abilities: Needs assist Patient Positioning: Upright in bed Baseline Vocal Quality: Low vocal intensity Volitional Cough: Cognitively unable to elicit Volitional Swallow: Unable to elicit    Oral/Motor/Sensory Function Overall Oral Motor/Sensory Function: Other (comment) (no overt weakness)   Ice Chips Ice chips: Not tested   Thin Liquid Thin Liquid: Within functional limits Presentation: Straw    Nectar Thick Nectar Thick Liquid: Not tested   Honey Thick Honey Thick Liquid: Not tested   Puree Puree: Within functional limits   Solid     Solid: Impaired Pharyngeal Phase Impairments: Cough - Immediate      Saraiah, Braye 11/20/2022,9:00 AM

## 2022-11-20 NOTE — Evaluation (Signed)
Physical Therapy Evaluation Patient Details Name: Cindy [name] MRN: [id number] DOB: [birth year] Today's Date: 11/20/2022  History of Present Illness  Pt is a 62 year old woman admitted on 7/3 after a prolonged seizures lasting 20-25 minutes. Transferred from [facility] to [facility]. PMH: seizures, CVA, HTN, HLD, remote substance abuse.  Clinical Impression  Pt presents today with impaired functional mobility, limited by cognition, strength with R weaker than L, L sided gaze preference, and decreased balance and activity tolerance. Pt was able to perform bed mobility and stand pivot transfers at baseline with her sister assisting with holding and managing the RW for pt to pivot. Pt's sister was present throughout the session and very supportive, she provided the history and prior level of function, acts as pt's primary caregiver, and would prefer for pt to return home at discharge. Pt requiring mod-maxAx2 for mobility today, limited by continuous EEG running as well as cognition, requiring increased time and processing for command following. Pt will continue to benefit from skilled acute PT at this time, recommend HHPT and a hoyer lift upon discharge as long as family remains comfortable caring for pt at her functional level. Will continue to follow as appropriate.       Assistance Recommended at Discharge Frequent or constant Supervision/Assistance  If plan is discharge home, recommend the following:  Can travel by private vehicle  Two people to help with walking and/or transfers;Assist for transportation;Direct supervision/assist for medications management;A lot of help with bathing/dressing/bathroom        Equipment Recommendations Other (comment) (hoyer lift)  Recommendations for Other Services       Functional Status Assessment Patient has had a recent decline in their functional status and demonstrates the ability to make significant improvements in function in a reasonable and predictable  amount of time.     Precautions / Restrictions Precautions Precautions: Fall;Other (comment) Precaution Comments: seizures Restrictions Weight Bearing Restrictions: No      Mobility  Bed Mobility Overal bed mobility: Needs Assistance Bed Mobility: Rolling, Supine to Sit, Sit to Supine Rolling: Mod assist   Supine to sit: +2 for physical assistance, Max assist, HOB elevated Sit to supine: +2 for physical assistance, Max assist, HOB elevated   General bed mobility comments: assist for trunk and BLE management for supine<>sit. rolling in bed for peri-care with assist for trunk and BLE management    Transfers Overall transfer level: Needs assistance Equipment used: Rolling walker (2 wheels) Transfers: Sit to/from Stand Sit to Stand: +2 physical assistance, Mod assist           General transfer comment: pt stands at baseline with B hands on the RW and her sister holding it down, performed same method today to simulate home set up, requiring modAx2 to power up, pt remaining with trunk flexion and downward gaze, unable to achieve full upright posture. Assistance needed to maintain R hand on the RW    Ambulation/Gait               General Gait Details: pt doesn't ambulate at baseline  Stairs            Wheelchair Mobility     Tilt Bed    Modified Rankin (Stroke Patients Only)       Balance Overall balance assessment: Needs assistance Sitting-balance support: Single extremity supported, Feet supported Sitting balance-Leahy Scale: Fair     Standing balance support: Bilateral upper extremity supported, During functional activity, Reliant on assistive device for balance Standing balance-Leahy Scale:  Poor Standing balance comment: requiring external assist for balance                             Pertinent Vitals/Pain Pain Assessment Pain Assessment: Faces Faces Pain Scale: No hurt    Home Living Family/patient expects to be discharged  to:: Private residence Living Arrangements: Other relatives Available Help at Discharge: Family;Available 24 hours/day Type of Home: House Home Access: Ramped entrance       Home Layout: Able to live on main level with bedroom/bathroom Home Equipment: Rolling Walker (2 wheels);Wheelchair - manual;BSC/3in1;Hospital bed;Other (comment) (gait belt) Additional Comments: lives with sister and brother in law, as well as nieces. sister is primary caregiver, provided home set up and PLOF today    Prior Function Prior Level of Function : Needs assist             Mobility Comments: pt performs sit<>stand transfers with RW and 1 person assist, has w/c she utilizes for mobility ADLs Comments: self feeds, dresses at bed level, assisted for bathing and all IADL     Hand Dominance   Dominant Hand: Right    Extremity/Trunk Assessment   Upper Extremity Assessment Upper Extremity Assessment: Defer to OT evaluation RUE Deficits / Details: can perform hand to mouth, assist to place on walker RUE Coordination: decreased fine motor;decreased gross motor LUE Deficits / Details: generalized weakness LUE Coordination: decreased fine motor;decreased gross motor    Lower Extremity Assessment Lower Extremity Assessment: RLE deficits/detail;LLE deficits/detail RLE Deficits / Details: difficult to fully assess due to cognition but requiring assist to manage RLE on/off the bed as well as with rolling LLE Deficits / Details: generalized weakness    Cervical / Trunk Assessment Cervical / Trunk Assessment: Other exceptions Cervical / Trunk Exceptions: has left gaze preference with head maintaining in left cervical flexion  Communication   Communication: Expressive difficulties  Cognition Arousal/Alertness: Awake/alert Behavior During Therapy: Flat affect Overall Cognitive Status: Impaired/Different from baseline Area of Impairment: Following commands                       Following  Commands: Follows one step commands with increased time       General Comments: pt requires multimodal cueing for command following and particiapting in mobility, sister at bedside and very supportive. Pt only verbalizing "ok" throughout a majority of the session, once verbalizing "I can't move" in reference to her head        General Comments General comments (skin integrity, edema, etc.): HR increased to 140s after standing trial, recovers with seated rest break    Exercises     Assessment/Plan    PT Assessment Patient needs continued PT services  PT Problem List Decreased strength;Decreased activity tolerance;Decreased balance;Decreased mobility;Decreased cognition;Decreased safety awareness;Decreased knowledge of precautions       PT Treatment Interventions DME instruction;Functional mobility training;Therapeutic activities;Therapeutic exercise;Balance training;Neuromuscular re-education;Cognitive remediation;Wheelchair mobility training;Patient/family education    PT Goals (Current goals can be found in the Care Plan section)  Acute Rehab PT Goals Patient Stated Goal: for pt to return home PT Goal Formulation: With patient/family Time For Goal Achievement: 12/04/22 Potential to Achieve Goals: Fair    Frequency Min 3X/week     Co-evaluation PT/OT/SLP Co-Evaluation/Treatment: Yes Reason for Co-Treatment: Complexity of the patient's impairments (multi-system involvement);To address functional/ADL transfers PT goals addressed during session: Mobility/safety with mobility;Balance OT goals addressed during session: ADL's and self-care  AM-PAC PT "6 Clicks" Mobility  Outcome Measure Help needed turning from your back to your side while in a flat bed without using bedrails?: A Lot Help needed moving from lying on your back to sitting on the side of a flat bed without using bedrails?: A Lot Help needed moving to and from a bed to a chair (including a wheelchair)?:  Total Help needed standing up from a chair using your arms (e.g., wheelchair or bedside chair)?: Total Help needed to walk in hospital room?: Total Help needed climbing 3-5 steps with a railing? : Total 6 Click Score: 8    End of Session Equipment Utilized During Treatment: Gait belt Activity Tolerance: Patient tolerated treatment well Patient left: in bed;with call bell/phone within reach;with bed alarm set;with family/visitor present Nurse Communication: Mobility status PT Visit Diagnosis: Muscle weakness (generalized) (M62.81);Other abnormalities of gait and mobility (R26.89)    Time: 1355-1431 PT Time Calculation (min) (ACUTE ONLY): 36 min   Charges:   PT Evaluation $PT Eval Moderate Complexity: 1 Mod   PT General Charges $$ ACUTE PT VISIT: 1 Visit         Lindalou Hose, PT DPT Acute Rehabilitation Services Office [phone]   Leonie Man 11/20/2022, 4:26 PM

## 2022-11-20 NOTE — Plan of Care (Signed)
  Problem: Education: Goal: Knowledge of disease or condition will improve Outcome: Progressing Goal: Knowledge of secondary prevention will improve  Outcome: Progressing Goal: Knowledge of patient specific risk factors will improve Outcome: Progressing   Problem: Ischemic Stroke/TIA Tissue Perfusion: Goal: Complications of ischemic stroke/TIA will be minimized Outcome: Progressing   Problem: Coping: Goal: Will verbalize positive feelings about self Outcome: Progressing Goal: Will identify appropriate support needs Outcome: Progressing   Problem: Health Behavior/Discharge Planning: Goal: Ability to manage health-related needs will improve Outcome: Progressing Goal: Goals will be collaboratively established with patient/family Outcome: Progressing   Problem: Self-Care: Goal: Ability to participate in self-care as condition permits will improve Outcome: Progressing Goal: Verbalization of feelings and concerns over difficulty with self-care will improve Outcome: Progressing Goal: Ability to communicate needs accurately will improve Outcome: Progressing   Problem: Nutrition: Goal: Risk of aspiration will decrease Outcome: Progressing Goal: Dietary intake will improve Outcome: Progressing   Problem: Education: Goal: Knowledge of General Education information will improve Description: Including pain rating scale, medication(s)/side effects and non-pharmacologic comfort measures Outcome: Progressing   Problem: Health Behavior/Discharge Planning: Goal: Ability to manage health-related needs will improve Outcome: Progressing   Problem: Clinical Measurements: Goal: Ability to maintain clinical measurements within normal limits will improve Outcome: Progressing Goal: Will remain free from infection Outcome: Progressing Goal: Diagnostic test results will improve Outcome: Progressing Goal: Respiratory complications will improve Outcome: Progressing Goal: Cardiovascular  complication will be avoided Outcome: Progressing   Problem: Activity: Goal: Risk for activity intolerance will decrease Outcome: Progressing   Problem: Nutrition: Goal: Adequate nutrition will be maintained Outcome: Progressing   Problem: Coping: Goal: Level of anxiety will decrease Outcome: Progressing   Problem: Elimination: Goal: Will not experience complications related to bowel motility Outcome: Progressing Goal: Will not experience complications related to urinary retention Outcome: Progressing   Problem: Pain Managment: Goal: General experience of comfort will improve Outcome: Progressing   Problem: Safety: Goal: Ability to remain free from injury will improve Outcome: Progressing   Problem: Skin Integrity: Goal: Risk for impaired skin integrity will decrease Outcome: Progressing   

## 2022-11-21 ENCOUNTER — Other Ambulatory Visit (HOSPITAL_COMMUNITY): Payer: Self-pay

## 2022-11-21 DIAGNOSIS — E7800 Pure hypercholesterolemia, unspecified: Secondary | ICD-10-CM | POA: Diagnosis not present

## 2022-11-21 DIAGNOSIS — R569 Unspecified convulsions: Secondary | ICD-10-CM | POA: Diagnosis not present

## 2022-11-21 DIAGNOSIS — I639 Cerebral infarction, unspecified: Secondary | ICD-10-CM | POA: Diagnosis not present

## 2022-11-21 DIAGNOSIS — I10 Essential (primary) hypertension: Secondary | ICD-10-CM | POA: Diagnosis not present

## 2022-11-21 LAB — CBC WITH DIFFERENTIAL/PLATELET
Abs Immature Granulocytes: 0.04 10*3/uL (ref 0.00–0.07)
Basophils Absolute: 0 10*3/uL (ref 0.0–0.1)
Basophils Relative: 0 %
Eosinophils Absolute: 0 10*3/uL (ref 0.0–0.5)
Eosinophils Relative: 1 %
HCT: 32.7 % — ABNORMAL LOW (ref 36.0–46.0)
Hemoglobin: 11.1 g/dL — ABNORMAL LOW (ref 12.0–15.0)
Immature Granulocytes: 1 %
Lymphocytes Relative: 25 %
Lymphs Abs: 1.7 10*3/uL (ref 0.7–4.0)
MCH: 31.4 pg (ref 26.0–34.0)
MCHC: 33.9 g/dL (ref 30.0–36.0)
MCV: 92.6 fL (ref 80.0–100.0)
Monocytes Absolute: 0.8 10*3/uL (ref 0.1–1.0)
Monocytes Relative: 12 %
Neutro Abs: 4.3 10*3/uL (ref 1.7–7.7)
Neutrophils Relative %: 61 %
Platelets: 187 10*3/uL (ref 150–400)
RBC: 3.53 MIL/uL — ABNORMAL LOW (ref 3.87–5.11)
RDW: 13.2 % (ref 11.5–15.5)
WBC: 6.9 10*3/uL (ref 4.0–10.5)
nRBC: 0 % (ref 0.0–0.2)

## 2022-11-21 LAB — BASIC METABOLIC PANEL
Anion gap: 8 (ref 5–15)
BUN: 23 mg/dL (ref 8–23)
CO2: 22 mmol/L (ref 22–32)
Calcium: 8.7 mg/dL — ABNORMAL LOW (ref 8.9–10.3)
Chloride: 100 mmol/L (ref 98–111)
Creatinine, Ser: 1.2 mg/dL — ABNORMAL HIGH (ref 0.44–1.00)
GFR, Estimated: 51 mL/min — ABNORMAL LOW (ref >60)
Glucose, Bld: 112 mg/dL — ABNORMAL HIGH (ref 70–99)
Potassium: 3.7 mmol/L (ref 3.5–5.1)
Sodium: 130 mmol/L — ABNORMAL LOW (ref 135–145)

## 2022-11-21 LAB — MAGNESIUM: Magnesium: 1.7 mg/dL (ref 1.7–2.4)

## 2022-11-21 LAB — UREA NITROGEN, URINE: Urea Nitrogen, Ur: 482 mg/dL

## 2022-11-21 MED ORDER — LACOSAMIDE 50 MG PO TABS
100.0000 mg | ORAL_TABLET | Freq: Two times a day (BID) | ORAL | Status: DC
  Administered 2022-11-21 – 2022-11-23 (×4): 100 mg via ORAL
  Filled 2022-11-21 (×4): qty 2

## 2022-11-21 MED ORDER — DIVALPROEX SODIUM 500 MG PO DR TAB
500.0000 mg | DELAYED_RELEASE_TABLET | Freq: Two times a day (BID) | ORAL | Status: DC
  Administered 2022-11-21 – 2022-11-23 (×4): 500 mg via ORAL
  Filled 2022-11-21 (×4): qty 1

## 2022-11-21 NOTE — Procedures (Addendum)
Patient Name: Cindy [name]  MRN: [id number]  Epilepsy Attending: Charlsie Quest  Referring Physician/Provider: Jefferson Fuel, MD  Duration: 11/20/2022 1955 to 11/21/2022 1136   Patient history: 62yo F with h/o seizures getting eeg to evaluate for seizure.   Level of alertness: Awake, asleep   AEDs during EEG study: LCM, VPA   Technical aspects: This EEG study was done with scalp electrodes positioned according to the 10-20 International system of electrode placement. Electrical activity was reviewed with band pass filter of 1-70Hz , sensitivity of 7 uV/mm, display speed of 10mm/sec with a 60Hz  notched filter applied as appropriate. EEG data were recorded continuously and digitally stored.  Video monitoring was available and reviewed as appropriate.   Description: The posterior dominant rhythm consists of 8 Hz activity of moderate voltage (25-35 uV) seen predominantly in posterior head regions, asymmetric ( L<R) and reactive to eye opening and eye closing. Drowsiness was characterized by attenuation of the posterior background rhythm. Sleep was characterized by vertex waves, sleep spindles (12 to 14 Hz), maximal frontocentral region. EEG showed continuous 3 to 6 Hz theta-delta slowing in left heisphere. Hyperventilation and photic stimulation were not performed.      ABNORMALITY - Continuous slow, Left hemisphere - Background asymmetry, left <right   IMPRESSION: This study is suggestive of cortical dysfunction arising from left hemisphere likely secondary to underlying structural abnormality, post-ictal state. No seizures or definite epileptiform discharges were seen throughout the recording.    [name]

## 2022-11-21 NOTE — Progress Notes (Signed)
EEG D/C'd. Patient had no noted skin breakdown.

## 2022-11-21 NOTE — Progress Notes (Signed)
PROGRESS NOTE        PATIENT DETAILS Name: Cindy [name] Age: 62 y.o. Sex: female Date of Birth: 16-Mar-1961 Admit Date: 11/19/2022 Admitting Physician Mauricio Annett Gula, MD ZOX:WRUEA, Julian Reil, FNP  Brief Summary: Patient is a 62 y.o.  female with history of prior CVA, seizure disorder, HTN, HLD-who presented with breakthrough seizures.  Significant events: 7/3>> admit to [facility]  Significant studies: 7/3>> MRI brain: No acute intracranial abnormality. 73>> CT angio head/neck: No LVO, no significant stenosis in the carotid arteries. 7/3-7/4>> LTM EEG: No seizures 7/4-7/5>> LTM EEG: No seizures  Significant microbiology data: None  Procedures: None  Consults: Neurology  Subjective: No major issues overnight-sleeping comfortably but awakes easily.  Moving both extremities more fluently today.  Objective: Vitals: Blood pressure 113/68, pulse 74, temperature (!) 97.5 F (36.4 C), temperature source Oral, resp. rate 14, height 5\' 7"  (1.702 m), weight 71.7 kg, SpO2 98 %.   Exam: Gen Exam:Alert awake-not in any distress HEENT:atraumatic, normocephalic Chest: B/L clear to auscultation anteriorly CVS:S1S2 regular Abdomen:soft non tender, non distended Extremities:no edema Neurology: Non focal Skin: no rash  Pertinent Labs/Radiology:    Latest Ref Rng & Units 11/21/2022    4:45 AM 11/20/2022    2:07 AM 11/19/2022    7:44 AM  CBC  WBC 4.0 - 10.5 K/uL 6.9  10.4  9.7   Hemoglobin 12.0 - 15.0 g/dL 54.0  98.1  19.1   Hematocrit 36.0 - 46.0 % 32.7  32.0  34.5   Platelets 150 - 400 K/uL 187  193  200     Lab Results  Component Value Date   NA 130 (L) 11/21/2022   K 3.7 11/21/2022   CL 100 11/21/2022   CO2 22 11/21/2022     Assessment/Plan: Breakthrough seizure LTM EEG ongoing On Depakote/Vimpat Neurology following-await recommendations  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 Home health recommended.  BMI: Estimated body mass index is 24.76 kg/m as calculated from the following:   Height as of this encounter: 5\' 7"  (1.702 m).   Weight as of this encounter: 71.7 kg.   Code status:   Code Status: DNR   DVT Prophylaxis: enoxaparin (LOVENOX) injection 40 mg Start: 11/19/22 1200 SCDs Start: 11/19/22 1117   Family Communication: sister at bedside  Disposition Plan: Status is: Observation The patient will require care spanning > 2 midnights and should be moved to inpatient because: Severity of illness   Planned Discharge Destination:Home health   Diet: Diet Order             DIET - DYS 1 Room service appropriate? No; Fluid consistency: Thin  Diet effective now                     Antimicrobial agents: Anti-infectives (From admission, onward)    None        MEDICATIONS: Scheduled Meds:  aspirin EC  81 mg Oral Daily   atorvastatin  40 mg Oral QHS   enoxaparin (LOVENOX) injection  40 mg Subcutaneous Q24H   folic acid  1 mg Oral Daily  lisinopril  5 mg Oral Daily   melatonin  3 mg Oral QHS   multivitamin with minerals  1 tablet Oral Daily   OLANZapine  5 mg Oral q AM   And   OLANZapine  10 mg Oral QHS   pantoprazole  40 mg Oral Daily   Continuous Infusions:  lacosamide (VIMPAT) IV 100 mg (11/21/22 1114)   valproate sodium 500 mg (11/21/22 1138)   PRN Meds:.acetaminophen **OR** acetaminophen, ondansetron **OR** ondansetron (ZOFRAN) IV   I have personally reviewed following labs and imaging studies  LABORATORY DATA: CBC: Recent Labs  Lab 11/19/22 0744 11/20/22 0207 11/21/22 0445  WBC 9.7 10.4 6.9  NEUTROABS 7.6  --  4.3  HGB 11.6* 11.0* 11.1*  HCT 34.5* 32.0* 32.7*   MCV 95.0 92.2 92.6  PLT 200 193 187     Basic Metabolic Panel: Recent Labs  Lab 11/19/22 0744 11/20/22 0207 11/21/22 0445  NA 130* 130* 130*  K 4.8 4.0 3.7  CL 99 94* 100  CO2 22 21* 22  GLUCOSE 112* 102* 112*  BUN 18 15 23   CREATININE 1.28* 1.20* 1.20*  CALCIUM 9.0 9.0 8.7*  MG 1.7  --  1.7     GFR: Estimated Creatinine Clearance: 46.7 mL/min (A) (by C-G formula based on SCr of 1.2 mg/dL (H)).  Liver Function Tests: Recent Labs  Lab 11/19/22 0744  AST 21  ALT 12  ALKPHOS 88  BILITOT 0.5  PROT 7.5  ALBUMIN 3.2*    No results for input(s): "LIPASE", "AMYLASE" in the last 168 hours. No results for input(s): "AMMONIA" in the last 168 hours.  Coagulation Profile: No results for input(s): "INR", "PROTIME" in the last 168 hours.  Cardiac Enzymes: Recent Labs  Lab 11/19/22 0744  CKTOTAL 114     BNP (last 3 results) No results for input(s): "PROBNP" in the last 8760 hours.  Lipid Profile: Recent Labs    11/20/22 0207  CHOL 105  HDL 51  LDLCALC 44  TRIG 48  CHOLHDL 2.1     Thyroid Function Tests: No results for input(s): "TSH", "T4TOTAL", "FREET4", "T3FREE", "THYROIDAB" in the last 72 hours.  Anemia Panel: No results for input(s): "VITAMINB12", "FOLATE", "FERRITIN", "TIBC", "IRON", "RETICCTPCT" in the last 72 hours.  Urine analysis:    Component Value Date/Time   COLORURINE YELLOW 11/20/2022 0645   APPEARANCEUR CLOUDY (A) 11/20/2022 0645   LABSPEC 1.033 (H) 11/20/2022 0645   PHURINE 7.0 11/20/2022 0645   GLUCOSEU NEGATIVE 11/20/2022 0645   HGBUR MODERATE (A) 11/20/2022 0645   BILIRUBINUR NEGATIVE 11/20/2022 0645   KETONESUR NEGATIVE 11/20/2022 0645   PROTEINUR NEGATIVE 11/20/2022 0645   NITRITE NEGATIVE 11/20/2022 0645   LEUKOCYTESUR SMALL (A) 11/20/2022 0645    Sepsis Labs: Lactic Acid, Venous    Component Value Date/Time   LATICACIDVEN 1.3 08/23/2020 1958    MICROBIOLOGY: No results found for this or any previous visit (from  the past 240 hour(s)).  RADIOLOGY STUDIES/RESULTS: Overnight EEG with video  Result Date: 11/20/2022 Charlsie Quest, MD     11/21/2022  9:20 AM Patient Name: KEILEI GREENHALGH MRN: [id number] Epilepsy Attending: Charlsie Quest Referring Physician/Provider: Jefferson Fuel, MD Duration: 11/19/2022 1955 to 11/20/2022 1955 Patient history: 62yo F with h/o seizures getting eeg to evaluate for seizure. Level of alertness: Awake, asleep AEDs during EEG study: LCM, VPA Technical aspects: This EEG study was done with scalp electrodes positioned according to the 10-20 International system of electrode placement. Electrical activity was reviewed with  band pass filter of 1-70Hz , sensitivity of 7 uV/mm, display speed of 11mm/sec with a 60Hz  notched filter applied as appropriate. EEG data were recorded continuously and digitally stored.  Video monitoring was available and reviewed as appropriate. Description: The posterior dominant rhythm consists of 8 Hz activity of moderate voltage (25-35 uV) seen predominantly in posterior head regions, asymmetric ( L<R) and reactive to eye opening and eye closing. Drowsiness was characterized by attenuation of the posterior background rhythm. Sleep was characterized by vertex waves, sleep spindles (12 to 14 Hz), maximal frontocentral region. EEG showed continuous 3 to 6 Hz theta-delta slowing in left heisphere. Hyperventilation and photic stimulation were not performed.   ABNORMALITY - Continuous slow, Left hemisphere - Background asymmetry, left <right IMPRESSION: This study is suggestive of cortical dysfunction arising from left hemisphere likely secondary to underlying structural abnormality, post-ictal state. No seizures or definite epileptiform discharges were seen throughout the recording. Charlsie Quest   ECHOCARDIOGRAM COMPLETE  Result Date: 11/19/2022    ECHOCARDIOGRAM REPORT   Patient Name:   ROMEKA BLACKWOOD Date of Exam: 11/19/2022 Medical Rec #:  [id number]    Height:       67.0 in  Accession #:    [id number]   Weight:       158.1 lb Date of Birth:  12/07/1960   BSA:          1.830 m Patient Age:    61 years     BP:           147/86 mmHg Patient Gender: F            HR:           92 bpm. [name] Procedure: 2D Echo, Cardiac Doppler and Color Doppler Indications:    TIA G45.9  History:        Patient has prior history of Echocardiogram examinations, most                 recent 08/06/2019. Stroke; Risk Factors:Hypertension and                 Dyslipidemia. ALCOHOL USE, Cocaine abuse (HCC).  Sonographer:    Celesta Gentile RCS Referring Phys: [id number] MAURICIO DANIEL ARRIEN IMPRESSIONS  1. Left ventricular ejection fraction, by estimation, is 65 to 70%. The left ventricle has normal function. The left ventricle has no regional wall motion abnormalities. There is mild left ventricular hypertrophy. Left ventricular diastolic parameters are consistent with Grade I diastolic dysfunction (impaired relaxation).  2. Right ventricular systolic function is normal. The right ventricular size is normal.  3. The mitral valve is normal in structure. No evidence of mitral valve regurgitation. No evidence of mitral stenosis.  4. The aortic valve is tricuspid. Aortic valve regurgitation is not visualized. No aortic stenosis is present.  5. The inferior vena cava is normal in size with greater than 50% respiratory variability, suggesting right atrial pressure of 3 mmHg. FINDINGS  Left Ventricle: Left ventricular ejection fraction, by estimation, is 65 to 70%. The left ventricle has normal function. The left ventricle has no regional wall motion abnormalities. Definity contrast agent was given IV to delineate the left ventricular  endocardial borders. The left ventricular internal cavity size was normal in size. There is mild left ventricular hypertrophy. Left ventricular diastolic parameters are consistent with Grade I diastolic dysfunction (impaired relaxation). Normal left ventricular filling  pressure. Right Ventricle: The right ventricular size is normal. Right vetricular wall thickness was not  well visualized. Right ventricular systolic function is normal. Left Atrium: Left atrial size was normal in size. Right Atrium: Right atrial size was normal in size. Pericardium: There is no evidence of pericardial effusion. Mitral Valve: The mitral valve is normal in structure. No evidence of mitral valve regurgitation. No evidence of mitral valve stenosis. Tricuspid Valve: The tricuspid valve is normal in structure. Tricuspid valve regurgitation is not demonstrated. No evidence of tricuspid stenosis. Aortic Valve: The aortic valve is tricuspid. Aortic valve regurgitation is not visualized. No aortic stenosis is present. Aortic valve mean gradient measures 2.7 mmHg. Aortic valve peak gradient measures 5.5 mmHg. Aortic valve area, by VTI measures 2.15 cm. Pulmonic Valve: The pulmonic valve was not well visualized. Pulmonic valve regurgitation is not visualized. No evidence of pulmonic stenosis. Aorta: The aortic root is normal in size and structure. Venous: The inferior vena cava is normal in size with greater than 50% respiratory variability, suggesting right atrial pressure of 3 mmHg. IAS/Shunts: No atrial level shunt detected by color flow Doppler.  LEFT VENTRICLE PLAX 2D LVIDd:         3.70 cm   Diastology LVIDs:         1.90 cm   LV e' medial:    5.98 cm/s LV PW:         1.20 cm   LV E/e' medial:  7.8 LV IVS:        1.10 cm   LV e' lateral:   7.40 cm/s LVOT diam:     1.60 cm   LV E/e' lateral: 6.3 LV SV:         46 LV SV Index:   25 LVOT Area:     2.01 cm  RIGHT VENTRICLE RV S prime:     16.30 cm/s TAPSE (M-mode): 2.0 cm LEFT ATRIUM             Index        RIGHT ATRIUM          Index LA diam:        2.30 cm 1.26 cm/m   RA Area:     8.50 cm LA Vol (A2C):   23.5 ml 12.84 ml/m  RA Volume:   15.60 ml 8.52 ml/m LA Vol (A4C):   28.7 ml 15.68 ml/m LA Biplane Vol: 27.9 ml 15.25 ml/m  AORTIC VALVE AV Area  (Vmax):    1.79 cm AV Area (Vmean):   2.06 cm AV Area (VTI):     2.15 cm AV Vmax:           117.71 cm/s AV Vmean:          75.413 cm/s AV VTI:            0.212 m AV Peak Grad:      5.5 mmHg AV Mean Grad:      2.7 mmHg LVOT Vmax:         105.00 cm/s LVOT Vmean:        77.200 cm/s LVOT VTI:          0.227 m LVOT/AV VTI ratio: 1.07  AORTA Ao Root diam: 3.40 cm MITRAL VALVE MV Area (PHT): 5.38 cm    SHUNTS MV Decel Time: 141 msec    Systemic VTI:  0.23 m MV E velocity: 46.60 cm/s  Systemic Diam: 1.60 cm MV A velocity: 86.40 cm/s MV E/A ratio:  0.54 Dina Rich MD Electronically signed by Dina Rich MD Signature Date/Time: 11/19/2022/3:22:38 PM    Final  MR Brain W and Wo Contrast  Result Date: 11/19/2022 CLINICAL DATA:  Provided history: Neuro deficit, acute, stroke suspected. EXAM: MRI HEAD WITHOUT AND WITH CONTRAST TECHNIQUE: Multiplanar, multiecho pulse sequences of the brain and surrounding structures were obtained without and with intravenous contrast. CONTRAST:  7mL GADAVIST GADOBUTROL 1 MMOL/ML IV SOLN COMPARISON:  Non-contrast head CT and CT angiogram head/neck 11/19/2022. Brain MRI 08/28/2020. FINDINGS: Intermittently motion degraded examination. Most notably, the axial T1-weighted sequence is moderate-to-severely motion degraded, the axial T1-weighted post-contrast sequence is severely motion degraded, the coronal T1-weighted post-contrast sequence is moderate-to-severely motion degraded and the sagittal T1-weighted post-contrast sequence is severely motion degraded. Within this limitation, findings are as follows. Brain: Generalized cerebral atrophy which is significantly advanced for age. Mild-to-moderate cerebellar atrophy. Commensurate prominence of the ventricles and sulci. Chronic lacunar infarcts within the bilateral cerebral hemispheric white matter and bilateral deep gray nuclei. Background advanced patchy and confluent T2 FLAIR hyperintense signal abnormality within the cerebral white  matter. Small chronic infarct within the medial left cerebellar hemisphere. There is no acute infarct. No evidence of an intracranial mass. No chronic intracranial blood products. No extra-axial fluid collection. No midline shift. No definite pathologic intracranial enhancement is identified, although evaluation is markedly limited due to motion degradation of the post-contrast imaging. Vascular: Maintained flow voids within the proximal large arterial vessels. Skull and upper cervical spine: No focal suspicious marrow lesion. Incompletely assessed cervical spondylosis. Sinuses/Orbits: No mass or acute finding within the imaged orbits. Mild mucosal thickening within the right maxillary sinus. Other: Small-volume fluid within the bilateral mastoid air cells. IMPRESSION: 1. Significantly motion degraded examination, as described. 2. Within this limitation, no acute intracranial abnormality is identified. The diffusion-weighted imaging is of good quality and there is no evidence of an acute infarct. 3. Redemonstrated chronic lacunar infarcts within the bilateral cerebral hemispheric white matter and within the bilateral deep gray nuclei. 4. Redemonstrated small chronic infarct within the left cerebellar hemisphere. 5. Background advanced cerebral white matter disease, nonspecific but favored to reflect sequelae of chronic small vessel ischemia (given the presence of multiple chronic infarcts within the supratentorial and infratentorial brain). 6. Cerebral and cerebellar atrophy, significantly advanced for age. 7. Mild right maxillary sinus mucosal thickening. 8. Small-volume fluid within the bilateral mastoid air cells. Electronically Signed   By: Jackey Loge D.O.   On: 11/19/2022 12:44     LOS: 1 day   Jeoffrey Massed, MD  [facility]    To contact the attending provider between 7A-7P or the covering provider during after hours 7P-7A, please log into the web site [email or web address] and access using  universal [place] password for that web site. If you do not have the password, please call the hospital operator.  11/21/2022, 12:02 PM

## 2022-11-21 NOTE — TOC Transition Note (Addendum)
Transition of Care [facility]) - CM/SW Discharge Note   Patient Details  Name: Cindy [name] MRN: [id number] Date of Birth: 1960/08/07  [facility] CM/SW Contact:  Gordy Clement, RN Phone Number: 11/21/2022, 3:28 PM   Clinical Narrative:     CM trying to find accepting agency for [facility] PT and OT. [facility], [facility], [facility], [facility],all said no   Still waiting to hear from [place] and [place].   Barrier is insurance (Medicaid ) and/or location. [facility])   [place]oyer lift has been ordered and will be delivered to home by [employer]  TOC will continue to follow patient for any additional discharge needs            Patient Goals and CMS Choice      Discharge Placement                         Discharge Plan and Services Additional resources added to the After Visit Summary for                                       Social Determinants of Health (SDOH) Interventions SDOH Screenings   Food Insecurity: Patient Unable To Answer (11/20/2022)  Housing: High Risk (11/20/2022)  Transportation Needs: Patient Unable To Answer (11/20/2022)  Utilities: Patient Unable To Answer (11/20/2022)  Tobacco Use: Low Risk  (11/19/2022)     Readmission Risk Interventions     No data to display

## 2022-11-21 NOTE — Progress Notes (Addendum)
Subjective: Sister at bedside states that he is was able to have clear conversations, walking with a walker, needed help with balance but was able to dress herself.  This is he has been staying with her for the last 1 year and has only been on Depakote.  However she was with her husband before that and sister is not aware if she was supposed to be on Vimpat.  Sister also states she takes care of her nearly blind husband and therefore has been unable to keep up with neurology follow-up appointments.  Sister does state that he has been moving her right side more today compared to yesterday and also moaning few words.  ROS: Unable to obtain due to poor mental status  Examination  Vital signs in last 24 hours: Temp:  [96 F (35.6 C)-98 F (36.7 C)] 96 F (35.6 C) (07/05 0340) Pulse Rate:  [73-79] 73 (07/05 0340) Resp:  [16-18] 18 (07/05 0340) BP: (117-137)/(72-115) 117/72 (07/05 0340) SpO2:  [94 %-97 %] 94 % (07/05 0340)  General: lying in bed, NAD Neuro: Awake, looks at examiner and tracks examiner in room, mumbles some words but then keeps repeating she is in her bed, did not follow commands, PERRLA, blinks to threat bilaterally, antigravity strength in left upper and left lower extremity, withdraws to noxious stimuli in right upper and right lower extremity   Basic Metabolic Panel: Recent Labs  Lab 11/19/22 0744 11/20/22 0207 11/21/22 0445  NA 130* 130* 130*  K 4.8 4.0 3.7  CL 99 94* 100  CO2 22 21* 22  GLUCOSE 112* 102* 112*  BUN 18 15 23   CREATININE 1.28* 1.20* 1.20*  CALCIUM 9.0 9.0 8.7*  MG 1.7  --  1.7    CBC: Recent Labs  Lab 11/19/22 0744 11/20/22 0207 11/21/22 0445  WBC 9.7 10.4 6.9  NEUTROABS 7.6  --  4.3  HGB 11.6* 11.0* 11.1*  HCT 34.5* 32.0* 32.7*  MCV 95.0 92.2 92.6  PLT 200 193 187     Coagulation Studies: No results for input(s): "LABPROT", "INR" in the last 72 hours.  Imaging MR Brain wo contrast 11/19/2022:  1. Significantly motion degraded  examination, as described. 2. Within this limitation, no acute intracranial abnormality is identified. The diffusion-weighted imaging is of good quality and there is no evidence of an acute infarct. 3. Redemonstrated chronic lacunar infarcts within the bilateral cerebral hemispheric white matter and within the bilateral deep gray nuclei. 4. Redemonstrated small chronic infarct within the left cerebellar hemisphere. 5. Background advanced cerebral white matter disease, nonspecific but favored to reflect sequelae of chronic small vessel ischemia (given the presence of multiple chronic infarcts within the supratentorial and infratentorial brain). 6. Cerebral and cerebellar atrophy, significantly advanced for age. 7. Mild right maxillary sinus mucosal thickening. 8. Small-volume fluid within the bilateral mastoid air cells.   ASSESSMENT AND PLAN: 62 y.o. female  with PMH significant for prior stroke, HTN, HLD, remote substance use, seizures on Depakote and Vimpat who presented to [facility] from home with a prolonged seizure lasting 20-25 mins.   Epilepsy with breakthrough seizure Right hemiparesis, Todd's paralysis -No clear etiology.  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  Will switch to p.o. -Rescue medication: Will check if intranasal Valtoco 15 mg is approved by insurance.  If not, recommend  diazepam 2 mg for seizure lasting more than 2 minutes -DC LTM EEG as no seizures -PT/OT/speech therapy -Follow-up with [facility] in 2 to 3 months -Discussed plan with patient's sister at bedside and Dr. Jerral Ralph via secure chat  Seizure precautions: Per [facility] statutes, patients with seizures are not allowed to drive until they have been seizure-free for six months and cleared by a physician     Use caution when using heavy equipment or power tools. Avoid working on ladders or at heights. Take showers instead of baths. Ensure the water temperature is not too high on the home water heater. Do not go swimming alone. Do not lock yourself in a room alone (i.e. bathroom). When caring for infants or small children, sit down when holding, feeding, or changing them to minimize risk of injury to the child in the event you have a seizure. Maintain good sleep hygiene. Avoid alcohol.    If patient has another seizure, call 911 and bring them back to the ED if: A.  The seizure lasts longer than 5 minutes.      B.  The patient doesn't wake shortly after the seizure or has new problems such as difficulty seeing, speaking or moving following the seizure C.  The patient was injured during the seizure D.  The patient has a temperature over 102 F (39C) E.  The patient vomited during the seizure and now is having trouble breathing    During the Seizure   - First, ensure adequate ventilation and place patients on the floor on their left side  Loosen clothing around the neck and ensure the airway is patent. If the patient is clenching the teeth, do not force the mouth open with any object as this can cause severe damage - Remove all items from the surrounding that can be hazardous. The patient may be oblivious to what's happening and may not even know what he or she is doing. If the patient is confused and wandering, either gently guide him/her away and block access to outside areas - Reassure the individual and be comforting - Call 911. In most cases, the seizure ends before EMS arrives. However, there are cases when seizures may last over 3 to 5 minutes. Or the individual may have developed breathing difficulties or severe injuries. If a pregnant patient or a person with diabetes develops a seizure, it is prudent to call an ambulance. - Finally, if the patient does not regain full consciousness, then call  EMS. Most patients will remain confused for about 45 to 90 minutes after a seizure, so you must use judgment in calling for help.    After the Seizure (Postictal Stage)   After a seizure, most patients experience confusion, fatigue, muscle pain and/or a headache. Thus, one should permit the individual to sleep. For the next few days, reassurance is essential. Being calm and helping reorient the person is also of importance.   Most seizures are painless and end spontaneously. Seizures are not harmful to others but can lead to complications such as stress on the lungs, brain and the heart. Individuals with prior lung problems may develop labored breathing and respiratory distress.      I have spent a total of   36 minutes with the patient reviewing hospital notes,  test results, labs and examining the patient as well as establishing an assessment and plan.  > 50% of time was spent in direct patient care.     Lindie Spruce Epilepsy [facility] For questions after 5pm please refer to AMION to reach the Neurologist on call

## 2022-11-21 NOTE — Progress Notes (Addendum)
Speech Language Pathology Treatment: Dysphagia  Patient Details Name: Cindy [name] MRN: [id number] DOB: [birth year] Today's Date: 11/21/2022 Time: 1308-6578 SLP Time Calculation (min) (ACUTE ONLY): 13 min  Assessment / Plan / Recommendation Clinical Impression  Pt was lethargic during session, able to open her eyes but needing stimuli to maintain. Pt's sister reported she was awake this morning and able to eat breakfast without difficulty. She needed extra time to coordinate use of straw and had an immediate cough on 2 trials likely due to inadequate level of arousal. SLP stopped po's and educated sister to hold food/liquids if she is not able to maintain her alertness. She took pills with water this morning however, therapist recommends holding meds until fully alert or if she is only slightly drowsy could crush in applesauce and notified RN. Continue puree texture, thin liquids when she is alert and ST will follow up for tolerance and texture advancement when able.   Speech-language-cognitive assessment ordered however pt not able to participate at this time.    HPI HPI: Cindy [name] is a 62 y.o. female with PMH significant for prior stroke, HTN, HLD, remote substance use, seizures who presented to [facility] from home with a prolonged seizure lasting 20-25 mins. Per chart at baseline, normally awake and conversant and able to feed herself.   MRI brain w + w/o C with no acute abnormalities. BSE 08/2020 on Dys 3/thin      SLP Plan  Continue with current plan of care      Recommendations for follow up therapy are one component of a multi-disciplinary discharge planning process, led by the attending physician.  Recommendations may be updated based on patient status, additional functional criteria and insurance authorization.    Recommendations  Diet recommendations: Dysphagia 1 (puree);Thin liquid Liquids provided via: Cup;Straw Medication Administration:  (if alert with thin, if not as alert crush  meds) Supervision: Staff to assist with self feeding;Full supervision/cueing for compensatory strategies Compensations: Slow rate;Small sips/bites Postural Changes and/or Swallow Maneuvers: Seated upright 90 degrees                  Oral care BID   Frequent or constant Supervision/Assistance Dysphagia, unspecified (R13.10)     Continue with current plan of care     Cindy [name], [name]  11/21/2022, 11:37 AM

## 2022-11-21 NOTE — Progress Notes (Signed)
Occupational Therapy Treatment Patient Details Name: Cindy [name] MRN: [id number] DOB: 1961/02/02 Today's Date: 11/21/2022   History of present illness Pt is a 62 year old woman admitted on 7/3 after a prolonged seizures lasting 20-25 minutes. Transferred from [facility] to [facility]. PMH: seizures, CVA, HTN, HLD, remote substance abuse.   OT comments  Patient demonstrated gains this treatment session with mod assist to get to EOB from mod +2 and max assist of one to return to supine. Patient able to stand x3 from EOB with mod assist +2 and stood x2 with max assist with face to face technique to move closer to [facility]. Patient placed in chair position in bed and able to wash face with min assist and mod assist to manage cup for feeding. Patient's sister continues to wish to take patient home with hoyer lift for transfer. Acute OT to continue to follow.    Recommendations for follow up therapy are one component of a multi-disciplinary discharge planning process, led by the attending physician.  Recommendations may be updated based on patient status, additional functional criteria and insurance authorization.    Assistance Recommended at Discharge Frequent or constant Supervision/Assistance  Patient can return home with the following  Two people to help with walking and/or transfers;A lot of help with bathing/dressing/bathroom;Assistance with cooking/housework;Assistance with feeding;Direct supervision/assist for medications management;Direct supervision/assist for financial management;Assist for transportation;Help with stairs or ramp for entrance   Equipment Recommendations  Other (comment) Michiel Sites lift)    Recommendations for Other Services      Precautions / Restrictions Precautions Precautions: Fall;Other (comment) Precaution Comments: seizures Restrictions Weight Bearing Restrictions: No       Mobility Bed Mobility Overal bed mobility: Needs Assistance Bed Mobility: Rolling, Supine to Sit,  Sit to Supine Rolling: Mod assist   Supine to sit: Mod assist, HOB elevated Sit to supine: Max assist   General bed mobility comments: patient assisting with getting BLE off EOB and assistance to raise trunk with HOB raised    Transfers Overall transfer level: Needs assistance Equipment used: Rolling walker (2 wheels) Transfers: Sit to/from Stand Sit to Stand: +2 physical assistance, Mod assist           General transfer comment: stood x3 from EOB to RW. stood x2 with face to face technique to move closer to [facility]     Balance Overall balance assessment: Needs assistance Sitting-balance support: Single extremity supported, Feet supported Sitting balance-Leahy Scale: Fair Sitting balance - Comments: min guard to supervision for sitting balance Postural control: Posterior lean Standing balance support: Bilateral upper extremity supported, During functional activity, Reliant on assistive device for balance Standing balance-Leahy Scale: Poor Standing balance comment: limited standing tolerance and mod assist for balance due to posterior leaning                           ADL either performed or assessed with clinical judgement   ADL Overall ADL's : Needs assistance/impaired Eating/Feeding: Moderate assistance;Bed level;Sitting Eating/Feeding Details (indicate cue type and reason): assistance to hold cup seated in chair position in bed Grooming: Wash/dry face;Minimal assistance;Sitting;Bed level Grooming Details (indicate cue type and reason): seated in chair position in bed                               General ADL Comments: jerky movements with use    Extremity/Trunk Assessment  Vision       Perception     Praxis      Cognition Arousal/Alertness: Awake/alert Behavior During Therapy: Flat affect Overall Cognitive Status: Impaired/Different from baseline Area of Impairment: Following commands                        Following Commands: Follows one step commands with increased time       General Comments: more alert this visit, following cues >50% of time        Exercises      Shoulder Instructions       General Comments      Pertinent Vitals/ Pain       Pain Assessment Pain Assessment: Faces Faces Pain Scale: No hurt Pain Intervention(s): Monitored during session  Home Living                                          Prior Functioning/Environment              Frequency  Min 1X/week        Progress Toward Goals  OT Goals(current goals can now be found in the care plan section)  Progress towards OT goals: Progressing toward goals  Acute Rehab OT Goals OT Goal Formulation: With family Time For Goal Achievement: 12/04/22 Potential to Achieve Goals: Good ADL Goals Pt Will Perform Eating: with min assist;sitting Pt Will Perform Grooming: with min assist;sitting Pt Will Perform Upper Body Dressing: with mod assist;sitting Pt Will Transfer to Toilet: with mod assist;stand pivot transfer;bedside commode Additional ADL Goal #1: Pt will roll using bed rails with min assist as a precursor to LB dressing as prior to admission. Additional ADL Goal #2: Pt will follow target 45 degrees toward R visual field.  Plan Discharge plan remains appropriate    Co-evaluation                 AM-PAC OT "6 Clicks" Daily Activity     Outcome Measure   Help from another person eating meals?: A Lot Help from another person taking care of personal grooming?: A Lot Help from another person toileting, which includes using toliet, bedpan, or urinal?: Total Help from another person bathing (including washing, rinsing, drying)?: Total Help from another person to put on and taking off regular upper body clothing?: Total Help from another person to put on and taking off regular lower body clothing?: Total 6 Click Score: 8    End of Session Equipment Utilized During  Treatment: Rolling walker (2 wheels);Gait belt  OT Visit Diagnosis: Unsteadiness on feet (R26.81);Muscle weakness (generalized) (M62.81);Other symptoms and signs involving cognitive function   Activity Tolerance Patient tolerated treatment well   Patient Left in bed;with call bell/phone within reach;with bed alarm set;with family/visitor present   Nurse Communication Mobility status        Time: 1610-9604 OT Time Calculation (min): 24 min  Charges: OT General Charges $OT Visit: 1 Visit OT Treatments $Self Care/Home Management : 8-22 mins $Therapeutic Activity: 8-22 mins  Alfonse Flavors, OTA Acute Rehabilitation Services  Office [phone]   Dewain Penning 11/21/2022, 2:44 PM

## 2022-11-21 NOTE — TOC Benefit Eligibility Note (Signed)
Pharmacy Patient Advocate Encounter  Insurance verification completed.    The patient is insured through [facility] MEDICAID    Ran test claim for [employer] and the current 30 day co-pay is $0.00.  Ran test claim for [place] and the current 30 day co-pay is $0.00.  This test claim was processed through [facility]- copay amounts may vary at other pharmacies due to pharmacy/plan contracts, or as the patient moves through the different stages of their insurance plan.

## 2022-11-21 NOTE — Progress Notes (Signed)
NIH every 4 hours is difficult to perform, due to pt's intermittent drowsiness, pt being mostly non-verbal, and pt intermittently following commands. This writer will continue to monitor pt's mental status, and provider will be made aware.

## 2022-11-22 ENCOUNTER — Other Ambulatory Visit (HOSPITAL_COMMUNITY): Payer: Self-pay

## 2022-11-22 DIAGNOSIS — E7800 Pure hypercholesterolemia, unspecified: Secondary | ICD-10-CM | POA: Diagnosis not present

## 2022-11-22 DIAGNOSIS — R569 Unspecified convulsions: Secondary | ICD-10-CM | POA: Diagnosis not present

## 2022-11-22 DIAGNOSIS — I10 Essential (primary) hypertension: Secondary | ICD-10-CM | POA: Diagnosis not present

## 2022-11-22 DIAGNOSIS — I639 Cerebral infarction, unspecified: Secondary | ICD-10-CM | POA: Diagnosis not present

## 2022-11-22 MED ORDER — VALTOCO 15 MG DOSE 7.5 MG/0.1ML NA LQPK
7.5000 mg | Freq: Once | NASAL | 0 refills | Status: DC | PRN
  Filled 2022-11-22: qty 2, 30d supply, fill #0

## 2022-11-22 MED ORDER — DIVALPROEX SODIUM 500 MG PO DR TAB
500.0000 mg | DELAYED_RELEASE_TABLET | Freq: Two times a day (BID) | ORAL | 2 refills | Status: DC
  Filled 2022-11-22 (×2): qty 60, 30d supply, fill #0

## 2022-11-22 MED ORDER — LACOSAMIDE 200 MG PO TABS
200.0000 mg | ORAL_TABLET | Freq: Two times a day (BID) | ORAL | 2 refills | Status: DC
  Filled 2022-11-22: qty 60, 30d supply, fill #0

## 2022-11-22 NOTE — Progress Notes (Signed)
Case manager discussed with sister-unable to arrange for home health services due to insurance issues-sister now requesting SNF placement.  Hold discharge plans for today.  Social worker following-will not be able to go to SNF over the weekend.

## 2022-11-22 NOTE — Plan of Care (Signed)
  Problem: Education: Goal: Knowledge of disease or condition will improve Outcome: Progressing Goal: Knowledge of secondary prevention will improve (MUST DOCUMENT ALL) Outcome: Progressing Goal: Knowledge of patient specific risk factors will improve [name] N/A or DELETE if not current risk factor) Outcome: Progressing   Problem: Ischemic Stroke/TIA Tissue Perfusion: Goal: Complications of ischemic stroke/TIA will be minimized Outcome: Progressing   Problem: Coping: Goal: Will verbalize positive feelings about self Outcome: Progressing Goal: Will identify appropriate support needs Outcome: Progressing   Problem: Health Behavior/Discharge Planning: Goal: Ability to manage health-related needs will improve Outcome: Progressing Goal: Goals will be collaboratively established with patient/family Outcome: Progressing   Problem: Nutrition: Goal: Risk of aspiration will decrease Outcome: Progressing Goal: Dietary intake will improve Outcome: Progressing   Problem: Education: Goal: Knowledge of General Education information will improve Description: Including pain rating scale, medication(s)/side effects and non-pharmacologic comfort measures Outcome: Progressing   Problem: Health Behavior/Discharge Planning: Goal: Ability to manage health-related needs will improve Outcome: Progressing   Problem: Clinical Measurements: Goal: Ability to maintain clinical measurements within normal limits will improve Outcome: Progressing Goal: Will remain free from infection Outcome: Progressing Goal: Diagnostic test results will improve Outcome: Progressing Goal: Respiratory complications will improve Outcome: Progressing Goal: Cardiovascular complication will be avoided Outcome: Progressing   Problem: Activity: Goal: Risk for activity intolerance will decrease Outcome: Progressing   Problem: Nutrition: Goal: Adequate nutrition will be maintained Outcome: Progressing   Problem:  Coping: Goal: Level of anxiety will decrease Outcome: Progressing   Problem: Elimination: Goal: Will not experience complications related to bowel motility Outcome: Progressing Goal: Will not experience complications related to urinary retention Outcome: Progressing   Problem: Pain Managment: Goal: General experience of comfort will improve Outcome: Progressing   Problem: Safety: Goal: Ability to remain free from injury will improve Outcome: Progressing   Problem: Skin Integrity: Goal: Risk for impaired skin integrity will decrease Outcome: Progressing

## 2022-11-22 NOTE — Discharge Summary (Signed)
PATIENT DETAILS Name: Cindy [name] Age: 62 y.o. Sex: female Date of Birth: July 24, 1960 MRN: [id number]. Admitting Physician: Coralie Keens, MD NWG:[id number], Julian Reil, FNP  Admit Date: 11/19/2022 Discharge date: 11/22/2022  Recommendations for Outpatient Follow-up:  Follow up with PCP in 1-2 weeks Please obtain CMP/CBC in one week Please follow up with [facility] neurology  Admitted From:  Home  Disposition: Home health   Discharge Condition: good  CODE STATUS:   Code Status: DNR   Diet recommendation:  Diet Order             Diet - low sodium heart healthy           DIET - DYS 1 Room service appropriate? No; Fluid consistency: Thin  Diet effective now                    Brief Summary: Patient is a 62 y.o.  female with history of prior CVA, seizure disorder, HTN, HLD-who presented with breakthrough seizures.   Significant events: 7/3>> admit to [facility]   Significant studies: 7/3>> MRI brain: No acute intracranial abnormality. 73>> CT angio head/neck: No LVO, no significant stenosis in the carotid arteries. 7/3-7/4>> LTM EEG: No seizures 7/4-7/5>> LTM EEG: No seizures   Significant microbiology data: None   Procedures: None   Consults: Neurology  Brief Hospital Course: Breakthrough seizure No further breakthrough seizures LTM EEG negative Vimpat was added-remained on Depakote No further recommendations from neurology apart from outpatient follow-up with [facility] neurology.     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   HTN BP stable  lisinopril   History of prior CVA Aspirin/statin Minimally ambulatory-only able to stand up with the help of a walker-dependent on family for almost all daily activities of living   Delirium Zyprexa    Debility/deconditioning/dysarthria PT/OT/SLP eval.  Home health recommended.   BMI: Estimated body mass index is 24.76 kg/m as calculated from the following:   Height as of this encounter: 5\' 7"  (1.702 m).   Weight as of this encounter: 71.7 kg.    Discharge Diagnoses:  Principal Problem:   Seizure [facility]) Active Problems:   Essential hypertension   Multiple cerebral infarctions (HCC)   Agitation   HYPERCHOLESTEROLEMIA   Discharge Instructions:  Activity:  As tolerated  Discharge Instructions     Call MD for:  extreme fatigue   Complete by: As directed    Call MD for:  persistant nausea and vomiting   Complete by: As directed    Call MD for:  severe uncontrolled pain   Complete by: As directed    Diet - low sodium heart healthy   Complete by: As directed    Discharge instructions   Complete by: As directed    Follow with Primary MD  Bucio, Elsa C, FNP in 1-2 weeks  Follow-up with [facility] neurology in the next 1-2 weeks  Please get a complete blood count and chemistry panel checked by your Primary MD at your next visit, and again as instructed by your Primary MD.  Get Medicines reviewed and adjusted: Please take all your medications with you for your next visit with your Primary MD  Laboratory/radiological data: Please request your Primary MD to go over all hospital tests and procedure/radiological results at the follow up, please ask your Primary MD to get all Hospital records sent to his/her  office.  In some cases, they will be blood work, cultures and biopsy results pending at the time of your discharge. Please request that your primary care M.D. follows up on these results.  Also Note the following: If you experience worsening of your admission symptoms, develop shortness of breath, life threatening emergency, suicidal or homicidal thoughts you must seek medical attention immediately by calling 911 or calling your MD immediately  if symptoms less  severe.  You must read complete instructions/literature along with all the possible adverse reactions/side effects for all the Medicines you take and that have been prescribed to you. Take any new Medicines after you have completely understood and accpet all the possible adverse reactions/side effects.   Do not drive when taking Pain medications or sleeping medications (Benzodaizepines)  Do not take more than prescribed Pain, Sleep and Anxiety Medications. It is not advisable to combine anxiety,sleep and pain medications without talking with your primary care practitioner  Special Instructions: If you have smoked or chewed Tobacco  in the last 2 yrs please stop smoking, stop any regular Alcohol  and or any Recreational drug use.  Wear Seat belts while driving.  Please note: You were cared for by a hospitalist during your hospital stay. Once you are discharged, your primary care physician will handle any further medical issues. Please note that NO REFILLS for any discharge medications will be authorized once you are discharged, as it is imperative that you return to your primary care physician (or establish a relationship with a primary care physician if you do not have one) for your post hospital discharge needs so that they can reassess your need for medications and monitor your lab values.     Seizure precautions: Per [facility] statutes, patients with seizures are not allowed to drive until they have been seizure-free for six months and cleared by a physician    Use caution when using heavy equipment or power tools. Avoid working on ladders or at heights. Take showers instead of baths. Ensure the water temperature is not too high on the home water heater. Do not go swimming alone. Do not lock yourself in a room alone (i.e. bathroom). When caring for infants or small children, sit down when holding, feeding, or changing them to minimize risk of injury to the child in the event you have a  seizure. Maintain good sleep hygiene. Avoid alcohol.    If patient has another seizure, call 911 and bring them back to the ED if: A.  The seizure lasts longer than 5 minutes.      B.  The patient doesn't wake shortly after the seizure or has new problems such as difficulty seeing, speaking or moving following the seizure C.  The patient was injured during the seizure D.  The patient has a temperature over 102 F (39C) E.  The patient vomited during the seizure and now is having trouble breathing    During the Seizure   - First, ensure adequate ventilation and place patients on the floor on their left side  Loosen clothing around the neck and ensure the airway is patent. If the patient is clenching the teeth, do not force the mouth open with any object as this can cause severe damage - Remove all items from the surrounding that can be hazardous. The patient may be oblivious to what's happening and may not even know what he or she is doing. If the patient is confused and wandering, either gently guide him/her away  and block access to outside areas - Reassure the individual and be comforting - Call 911. In most cases, the seizure ends before EMS arrives. However, there are cases when seizures may last over 3 to 5 minutes. Or the individual may have developed breathing difficulties or severe injuries. If a pregnant patient or a person with diabetes develops a seizure, it is prudent to call an ambulance. - Finally, if the patient does not regain full consciousness, then call EMS. Most patients will remain confused for about 45 to 90 minutes after a seizure, so you must use judgment in calling for help. - Avoid restraints but make sure the patient is in a bed with padded side rails - Place the individual in a lateral position with the neck slightly flexed; this will help the saliva drain from the mouth and prevent the tongue from falling backward - Remove all nearby furniture and other hazards from the  area - Provide verbal assurance as the individual is regaining consciousness - Provide the patient with privacy if possible - Call for help and start treatment as ordered by the caregiver    After the Seizure (Postictal Stage)   After a seizure, most patients experience confusion, fatigue, muscle pain and/or a headache. Thus, one should permit the individual to sleep. For the next few days, reassurance is essential. Being calm and helping reorient the person is also of importance.   Most seizures are painless and end spontaneously. Seizures are not harmful to others but can lead to complications such as stress on the lungs, brain and the heart. Individuals with prior lung problems may develop labored breathing and respiratory distress.    Increase activity slowly   Complete by: As directed       Allergies as of 11/22/2022   No Known Allergies      Medication List     STOP taking these medications    amLODipine 10 MG tablet Commonly known as: NORVASC   metoprolol tartrate 25 MG tablet Commonly known as: LOPRESSOR       TAKE these medications    acetaminophen 325 MG tablet Commonly known as: TYLENOL Take 2 tablets (650 mg total) by mouth every 6 (six) hours as needed (pain).   aspirin 325 MG tablet Take 1 tablet (325 mg total) by mouth daily.   atorvastatin 40 MG tablet Commonly known as: LIPITOR Take 1 tablet (40 mg total) by mouth at bedtime.   divalproex 500 MG DR tablet Commonly known as: DEPAKOTE Take 1 tablet (500 mg total) by mouth 2 (two) times daily. What changed: when to take this   folic acid 1 MG tablet Commonly known as: FOLVITE Take 1 tablet (1 mg total) by mouth daily.   lacosamide 200 MG Tabs tablet Commonly known as: VIMPAT Take 1 tablet (200 mg total) by mouth 2 (two) times daily.   lisinopril 5 MG tablet Commonly known as: ZESTRIL Take 2.5 mg by mouth daily.   melatonin 3 MG Tabs tablet Take 1 tablet (3 mg total) by mouth at bedtime.    multivitamin with minerals Tabs tablet Take 1 tablet by mouth daily.   OLANZapine 2.5 MG tablet Commonly known as: ZYPREXA take 1 tablet by mouth daily and  take 2 tablets at bedtime   pantoprazole 40 MG tablet Commonly known as: PROTONIX Take 1 tablet (40 mg total) by mouth daily.   Valtoco 15 MG Dose 2 x 7.5 MG/0.1ML Lqpk Generic drug: diazePAM (15 MG Dose) Place 7.5 mg into the  nose once as needed for up to 1 dose (valtoco ( 7.5 mg in each nostril) for seizure lasting more than 2 minutes.).               Durable Medical Equipment  (From admission, onward)           Start     Ordered   11/21/22 1355  For home use only DME Other see comment  Once       Comments: HOYER LIFT  Question:  Length of Need  Answer:  6 Months   11/21/22 1354            Follow-up Information     Bucio, Julian Reil, FNP. Schedule an appointment as soon as possible for a visit in 1 week(s).   Specialty: Family Medicine Contact information: [address] [place] [address] [phone]                No Known Allergies   Other Procedures/Studies: Overnight EEG with video  Result Date: 11/20/2022 Charlsie Quest, MD     11/21/2022  9:20 AM Patient Name: Cindy [name] MRN: [id number] Epilepsy Attending: Charlsie Quest Referring Physician/Provider: Jefferson Fuel, MD Duration: 11/19/2022 1955 to 11/20/2022 1955 Patient history: 62yo F with h/o seizures getting eeg to evaluate for seizure. Level of alertness: Awake, asleep AEDs during EEG study: LCM, VPA Technical aspects: This EEG study was done with scalp electrodes positioned according to the 10-20 International system of electrode placement. Electrical activity was reviewed with band pass filter of 1-70Hz , sensitivity of 7 uV/mm, display speed of 62mm/sec with a 60Hz  notched filter applied as appropriate. EEG data were recorded continuously and digitally stored.  Video monitoring was available and reviewed as appropriate. Description:  The posterior dominant rhythm consists of 8 Hz activity of moderate voltage (25-35 uV) seen predominantly in posterior head regions, asymmetric ( L<R) and reactive to eye opening and eye closing. Drowsiness was characterized by attenuation of the posterior background rhythm. Sleep was characterized by vertex waves, sleep spindles (12 to 14 Hz), maximal frontocentral region. EEG showed continuous 3 to 6 Hz theta-delta slowing in left heisphere. Hyperventilation and photic stimulation were not performed.   ABNORMALITY - Continuous slow, Left hemisphere - Background asymmetry, left <right IMPRESSION: This study is suggestive of cortical dysfunction arising from left hemisphere likely secondary to underlying structural abnormality, post-ictal state. No seizures or definite epileptiform discharges were seen throughout the recording. Charlsie Quest   ECHOCARDIOGRAM COMPLETE  Result Date: 11/19/2022    ECHOCARDIOGRAM REPORT   Patient Name:   VERLINA SHUBIN Date of Exam: 11/19/2022 Medical Rec #:  [id number]    Height:       67.0 in Accession #:    [id number]   Weight:       158.1 lb Date of Birth:  [birth year]   BSA:          1.830 m Patient Age:    61 years     BP:           147/86 mmHg Patient Gender: F            HR:           92 bpm. [name] Procedure: 2D Echo, Cardiac Doppler and Color Doppler Indications:    TIA G45.9  History:        Patient has prior history of Echocardiogram examinations, most  recent 08/06/2019. Stroke; Risk Factors:Hypertension and                 Dyslipidemia. ALCOHOL USE, Cocaine abuse (HCC).  Sonographer:    Celesta Gentile RCS Referring Phys: [id number] MAURICIO DANIEL ARRIEN IMPRESSIONS  1. Left ventricular ejection fraction, by estimation, is 65 to 70%. The left ventricle has normal function. The left ventricle has no regional wall motion abnormalities. There is mild left ventricular hypertrophy. Left ventricular diastolic parameters are consistent with Grade I  diastolic dysfunction (impaired relaxation).  2. Right ventricular systolic function is normal. The right ventricular size is normal.  3. The mitral valve is normal in structure. No evidence of mitral valve regurgitation. No evidence of mitral stenosis.  4. The aortic valve is tricuspid. Aortic valve regurgitation is not visualized. No aortic stenosis is present.  5. The inferior vena cava is normal in size with greater than 50% respiratory variability, suggesting right atrial pressure of 3 mmHg. FINDINGS  Left Ventricle: Left ventricular ejection fraction, by estimation, is 65 to 70%. The left ventricle has normal function. The left ventricle has no regional wall motion abnormalities. Definity contrast agent was given IV to delineate the left ventricular  endocardial borders. The left ventricular internal cavity size was normal in size. There is mild left ventricular hypertrophy. Left ventricular diastolic parameters are consistent with Grade I diastolic dysfunction (impaired relaxation). Normal left ventricular filling pressure. Right Ventricle: The right ventricular size is normal. Right vetricular wall thickness was not well visualized. Right ventricular systolic function is normal. Left Atrium: Left atrial size was normal in size. Right Atrium: Right atrial size was normal in size. Pericardium: There is no evidence of pericardial effusion. Mitral Valve: The mitral valve is normal in structure. No evidence of mitral valve regurgitation. No evidence of mitral valve stenosis. Tricuspid Valve: The tricuspid valve is normal in structure. Tricuspid valve regurgitation is not demonstrated. No evidence of tricuspid stenosis. Aortic Valve: The aortic valve is tricuspid. Aortic valve regurgitation is not visualized. No aortic stenosis is present. Aortic valve mean gradient measures 2.7 mmHg. Aortic valve peak gradient measures 5.5 mmHg. Aortic valve area, by VTI measures 2.15 cm. Pulmonic Valve: The pulmonic valve was  not well visualized. Pulmonic valve regurgitation is not visualized. No evidence of pulmonic stenosis. Aorta: The aortic root is normal in size and structure. Venous: The inferior vena cava is normal in size with greater than 50% respiratory variability, suggesting right atrial pressure of 3 mmHg. IAS/Shunts: No atrial level shunt detected by color flow Doppler.  LEFT VENTRICLE PLAX 2D LVIDd:         3.70 cm   Diastology LVIDs:         1.90 cm   LV e' medial:    5.98 cm/s LV PW:         1.20 cm   LV E/e' medial:  7.8 LV IVS:        1.10 cm   LV e' lateral:   7.40 cm/s LVOT diam:     1.60 cm   LV E/e' lateral: 6.3 LV SV:         46 LV SV Index:   25 LVOT Area:     2.01 cm  RIGHT VENTRICLE RV S prime:     16.30 cm/s TAPSE (M-mode): 2.0 cm LEFT ATRIUM             Index        RIGHT ATRIUM  Index LA diam:        2.30 cm 1.26 cm/m   RA Area:     8.50 cm LA Vol (A2C):   23.5 ml 12.84 ml/m  RA Volume:   15.60 ml 8.52 ml/m LA Vol (A4C):   28.7 ml 15.68 ml/m LA Biplane Vol: 27.9 ml 15.25 ml/m  AORTIC VALVE AV Area (Vmax):    1.79 cm AV Area (Vmean):   2.06 cm AV Area (VTI):     2.15 cm AV Vmax:           117.71 cm/s AV Vmean:          75.413 cm/s AV VTI:            0.212 m AV Peak Grad:      5.5 mmHg AV Mean Grad:      2.7 mmHg LVOT Vmax:         105.00 cm/s LVOT Vmean:        77.200 cm/s LVOT VTI:          0.227 m LVOT/AV VTI ratio: 1.07  AORTA Ao Root diam: 3.40 cm MITRAL VALVE MV Area (PHT): 5.38 cm    SHUNTS MV Decel Time: 141 msec    Systemic VTI:  0.23 m MV E velocity: 46.60 cm/s  Systemic Diam: 1.60 cm MV A velocity: 86.40 cm/s MV E/A ratio:  0.54 Dina Rich MD Electronically signed by Dina Rich MD Signature Date/Time: 11/19/2022/3:22:38 PM    Final    MR Brain W and Wo Contrast  Result Date: 11/19/2022 CLINICAL DATA:  Provided history: Neuro deficit, acute, stroke suspected. EXAM: MRI HEAD WITHOUT AND WITH CONTRAST TECHNIQUE: Multiplanar, multiecho pulse sequences of the brain and  surrounding structures were obtained without and with intravenous contrast. CONTRAST:  7mL GADAVIST GADOBUTROL 1 MMOL/ML IV SOLN COMPARISON:  Non-contrast head CT and CT angiogram head/neck 11/19/2022. Brain MRI 08/28/2020. FINDINGS: Intermittently motion degraded examination. Most notably, the axial T1-weighted sequence is moderate-to-severely motion degraded, the axial T1-weighted post-contrast sequence is severely motion degraded, the coronal T1-weighted post-contrast sequence is moderate-to-severely motion degraded and the sagittal T1-weighted post-contrast sequence is severely motion degraded. Within this limitation, findings are as follows. Brain: Generalized cerebral atrophy which is significantly advanced for age. Mild-to-moderate cerebellar atrophy. Commensurate prominence of the ventricles and sulci. Chronic lacunar infarcts within the bilateral cerebral hemispheric white matter and bilateral deep gray nuclei. Background advanced patchy and confluent T2 FLAIR hyperintense signal abnormality within the cerebral white matter. Small chronic infarct within the medial left cerebellar hemisphere. There is no acute infarct. No evidence of an intracranial mass. No chronic intracranial blood products. No extra-axial fluid collection. No midline shift. No definite pathologic intracranial enhancement is identified, although evaluation is markedly limited due to motion degradation of the post-contrast imaging. Vascular: Maintained flow voids within the proximal large arterial vessels. Skull and upper cervical spine: No focal suspicious marrow lesion. Incompletely assessed cervical spondylosis. Sinuses/Orbits: No mass or acute finding within the imaged orbits. Mild mucosal thickening within the right maxillary sinus. Other: Small-volume fluid within the bilateral mastoid air cells. IMPRESSION: 1. Significantly motion degraded examination, as described. 2. Within this limitation, no acute intracranial abnormality is  identified. The diffusion-weighted imaging is of good quality and there is no evidence of an acute infarct. 3. Redemonstrated chronic lacunar infarcts within the bilateral cerebral hemispheric white matter and within the bilateral deep gray nuclei. 4. Redemonstrated small chronic infarct within the left cerebellar hemisphere. 5. Background advanced cerebral white matter disease,  nonspecific but favored to reflect sequelae of chronic small vessel ischemia (given the presence of multiple chronic infarcts within the supratentorial and infratentorial brain). 6. Cerebral and cerebellar atrophy, significantly advanced for age. 7. Mild right maxillary sinus mucosal thickening. 8. Small-volume fluid within the bilateral mastoid air cells. Electronically Signed   By: Jackey Loge D.O.   On: 11/19/2022 12:44   CT ANGIO HEAD NECK W WO CM  Result Date: 11/19/2022 CLINICAL DATA:  Neuro deficit, acute, stroke suspected. Seizure presentation. EXAM: CT ANGIOGRAPHY HEAD AND NECK WITH AND WITHOUT CONTRAST TECHNIQUE: Multidetector CT imaging of the head and neck was performed using the standard protocol during bolus administration of intravenous contrast. Multiplanar CT image reconstructions and MIPs were obtained to evaluate the vascular anatomy. Carotid stenosis measurements (when applicable) are obtained utilizing NASCET criteria, using the distal internal carotid diameter as the denominator. RADIATION DOSE REDUCTION: This exam was performed according to the departmental dose-optimization program which includes automated exposure control, adjustment of the mA and/or kV according to patient size and/or use of iterative reconstruction technique. CONTRAST:  75mL OMNIPAQUE IOHEXOL 350 MG/ML SOLN COMPARISON:  Head CT same day.  MRI 08/28/2020. FINDINGS: CTA NECK FINDINGS Aortic arch: Aortic arch appears normal. Right carotid system: Common carotid artery widely patent to the bifurcation. Mild atherosclerotic plaque at the carotid  bifurcation and ICA bulb but no stenosis. Cervical ICA beyond that is tortuous but widely patent. Left carotid system: Common carotid artery widely patent to the bifurcation. Atherosclerotic plaque at the bifurcation and ICA bulb but no stenosis. Cervical ICA beyond that is tortuous but widely patent. Vertebral arteries: Both vertebral artery origins are widely patent. Both vertebral arteries appear normal through the cervical region to the foramen magnum. Skeleton: Ordinary mid cervical degenerative spondylosis. Other neck: No mass or lymphadenopathy. Upper chest: Lung apices are clear. Review of the MIP images confirms the above findings CTA HEAD FINDINGS Anterior circulation: Both internal carotid arteries are patent through the skull base and siphon regions. Ordinary siphon atherosclerotic calcification but no stenosis. The anterior and middle cerebral vessels are patent. No large vessel occlusion or proximal stenosis. 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 No aneurysm or vascular malformation. Posterior circulation: Both vertebral arteries are patent to the basilar artery. There is atherosclerotic calcification in both vertebral artery V4 segments with narrowing estimated at 30-50% on both sides. No basilar stenosis. Superior cerebellar and posterior cerebral arteries appear normal. Venous sinuses: Patent and normal. Anatomic variants: None significant otherwise. Review of the MIP images confirms the above findings IMPRESSION: 1. No large vessel occlusion. 2. Atherosclerotic disease at both carotid bifurcations but without stenosis. 3. 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 Electronically Signed   By: Paulina Fusi M.D.   On: 11/19/2022 10:52   CT HEAD WO CONTRAST  Result Date: 11/19/2022 CLINICAL DATA:  Mental status change, unknown cause EXAM: CT HEAD WITHOUT CONTRAST TECHNIQUE: Contiguous axial images were obtained from the base  of the skull through the vertex without intravenous contrast. RADIATION DOSE REDUCTION: This exam was performed according to the departmental dose-optimization program which includes automated exposure control, adjustment of the mA and/or kV according to patient size and/or use of iterative reconstruction technique. COMPARISON:  CT Head 08/23/20, Brain MR 08/23/20, 08/28/20 FINDINGS: Brain: No evidence of acute infarction, hemorrhage, hydrocephalus, extra-axial collection or mass lesion/mass effect. There is sequela moderate chronic microvascular ischemic change with advanced generalized volume loss. Chronic right thalamic infarct chronic  infarct in the right basal ganglia. Vascular: No hyperdense vessel or unexpected calcification. Skull: Normal. Negative for fracture or focal lesion. Sinuses/Orbits: No middle ear or mastoid effusion. Paranasal sinuses are notable for mucosal thickening in the right maxillary sinus. Orbits are unremarkable. Other: None. IMPRESSION: No acute intracranial abnormality. Electronically Signed   By: Lorenza Cambridge M.D.   On: 11/19/2022 08:47   DG Chest Port 1 View  Result Date: 11/19/2022 CLINICAL DATA:  Provided history: Altered mental status.  Seizure. EXAM: PORTABLE CHEST 1 VIEW COMPARISON:  Prior chest radiographs 09/02/2020 and earlier. FINDINGS: Shallow inspiration radiograph with accentuation of the cardiac silhouette. Taking this into account, heart size appears at the upper limits of normal. No appreciable airspace consolidation or pulmonary edema. No evidence of pleural effusion or pneumothorax. No acute osseous abnormality identified. IMPRESSION: Shallow inspiration radiograph. No evidence of an acute cardiopulmonary abnormality. Electronically Signed   By: Jackey Loge D.O.   On: 11/19/2022 08:11     TODAY-DAY OF DISCHARGE:  Subjective:   Llewellyn Aswad today has no headache,no chest abdominal pain,no new weakness tingling or numbness, feels much better wants to go home  today.   Objective:   Blood pressure 101/77, pulse 81, temperature 97.9 F (36.6 C), temperature source Oral, resp. rate 16, height 5\' 7"  (1.702 m), weight 71.7 kg, SpO2 96 %. No intake or output data in the 24 hours ending 11/22/22 0845 Filed Weights   11/19/22 0749  Weight: 71.7 kg    Exam: Awake Alert, Oriented *3, No new F.N deficits, Normal affect [place].AT,PERRAL Supple Neck,No JVD, No cervical lymphadenopathy appriciated.  Symmetrical Chest wall movement, Good air movement bilaterally, CTAB RRR,No Gallops,Rubs or new Murmurs, No Parasternal Heave +ve B.Sounds, Abd Soft, Non tender, No organomegaly appriciated, No rebound -guarding or rigidity. No Cyanosis, Clubbing or edema, No new Rash or bruise   PERTINENT RADIOLOGIC STUDIES: Overnight EEG with video  Result Date: 11/20/2022 Charlsie Quest, MD     11/21/2022  9:20 AM Patient Name: Cindy [name] MRN: [id number] Epilepsy Attending: Charlsie Quest Referring Physician/Provider: Jefferson Fuel, MD Duration: 11/19/2022 1955 to 11/20/2022 1955 Patient history: 62yo F with h/o seizures getting eeg to evaluate for seizure. Level of alertness: Awake, asleep AEDs during EEG study: LCM, VPA Technical aspects: This EEG study was done with scalp electrodes positioned according to the 10-20 International system of electrode placement. Electrical activity was reviewed with band pass filter of 1-70Hz , sensitivity of 7 uV/mm, display speed of 72mm/sec with a 60Hz  notched filter applied as appropriate. EEG data were recorded continuously and digitally stored.  Video monitoring was available and reviewed as appropriate. Description: The posterior dominant rhythm consists of 8 Hz activity of moderate voltage (25-35 uV) seen predominantly in posterior head regions, asymmetric ( L<R) and reactive to eye opening and eye closing. Drowsiness was characterized by attenuation of the posterior background rhythm. Sleep was characterized by vertex waves, sleep spindles  (12 to 14 Hz), maximal frontocentral region. EEG showed continuous 3 to 6 Hz theta-delta slowing in left heisphere. Hyperventilation and photic stimulation were not performed.   ABNORMALITY - Continuous slow, Left hemisphere - Background asymmetry, left <right IMPRESSION: This study is suggestive of cortical dysfunction arising from left hemisphere likely secondary to underlying structural abnormality, post-ictal state. No seizures or definite epileptiform discharges were seen throughout the recording. Priyanka [name]     PERTINENT LAB RESULTS: CBC: Recent Labs    11/20/22 0207 11/21/22 0445  WBC 10.4 6.9  HGB  11.0* 11.1*  HCT 32.0* 32.7*  PLT 193 187   CMET CMP     Component Value Date/Time   NA 130 (L) 11/21/2022 0445   NA 135 (L) 03/26/2015 0921   K 3.7 11/21/2022 0445   CL 100 11/21/2022 0445   CO2 22 11/21/2022 0445   GLUCOSE 112 (H) 11/21/2022 0445   BUN 23 11/21/2022 0445   BUN 7 03/26/2015 0921   CREATININE 1.20 (H) 11/21/2022 0445   CALCIUM 8.7 (L) 11/21/2022 0445   PROT 7.5 11/19/2022 0744   PROT 7.7 03/26/2015 0921   ALBUMIN 3.2 (L) 11/19/2022 0744   ALBUMIN 4.4 03/26/2015 0921   AST 21 11/19/2022 0744   ALT 12 11/19/2022 0744   ALKPHOS 88 11/19/2022 0744   BILITOT 0.5 11/19/2022 0744   BILITOT 0.6 03/26/2015 0921   GFRNONAA 51 (L) 11/21/2022 0445    GFR Estimated Creatinine Clearance: 46.7 mL/min (A) (by C-G formula based on SCr of 1.2 mg/dL (H)). No results for input(s): "LIPASE", "AMYLASE" in the last 72 hours. No results for input(s): "CKTOTAL", "CKMB", "CKMBINDEX", "TROPONINI" in the last 72 hours. Invalid input(s): "POCBNP" No results for input(s): "DDIMER" in the last 72 hours. No results for input(s): "HGBA1C" in the last 72 hours. Recent Labs    11/20/22 0207  CHOL 105  HDL 51  LDLCALC 44  TRIG 48  CHOLHDL 2.1   No results for input(s): "TSH", "T4TOTAL", "T3FREE", "THYROIDAB" in the last 72 hours.  Invalid input(s): "FREET3" No results  for input(s): "VITAMINB12", "FOLATE", "FERRITIN", "TIBC", "IRON", "RETICCTPCT" in the last 72 hours. Coags: No results for input(s): "INR" in the last 72 hours.  Invalid input(s): "PT" Microbiology: No results found for this or any previous visit (from the past 240 hour(s)).  FURTHER DISCHARGE INSTRUCTIONS:  Get Medicines reviewed and adjusted: Please take all your medications with you for your next visit with your Primary MD  Laboratory/radiological data: Please request your Primary MD to go over all hospital tests and procedure/radiological results at the follow up, please ask your Primary MD to get all Hospital records sent to his/her office.  In some cases, they will be blood work, cultures and biopsy results pending at the time of your discharge. Please request that your primary care M.D. goes through all the records of your hospital data and follows up on these results.  Also Note the following: If you experience worsening of your admission symptoms, develop shortness of breath, life threatening emergency, suicidal or homicidal thoughts you must seek medical attention immediately by calling 911 or calling your MD immediately  if symptoms less severe.  You must read complete instructions/literature along with all the possible adverse reactions/side effects for all the Medicines you take and that have been prescribed to you. Take any new Medicines after you have completely understood and accpet all the possible adverse reactions/side effects.   Do not drive when taking Pain medications or sleeping medications (Benzodaizepines)  Do not take more than prescribed Pain, Sleep and Anxiety Medications. It is not advisable to combine anxiety,sleep and pain medications without talking with your primary care practitioner  Special Instructions: If you have smoked or chewed Tobacco  in the last 2 yrs please stop smoking, stop any regular Alcohol  and or any Recreational drug use.  Wear Seat  belts while driving.  Please note: You were cared for by a hospitalist during your hospital stay. Once you are discharged, your primary care physician will handle any further medical issues. Please note  that NO REFILLS for any discharge medications will be authorized once you are discharged, as it is imperative that you return to your primary care physician (or establish a relationship with a primary care physician if you do not have one) for your post hospital discharge needs so that they can reassess your need for medications and monitor your lab values.  Total Time spent coordinating discharge including counseling, education and face to face time equals greater than 30 minutes.  SignedJeoffrey Massed 11/22/2022 8:45 AM

## 2022-11-22 NOTE — Progress Notes (Addendum)
Pt has been DC. Received referral to assist with [facility] PT/OT/ST/aide. Per previous CM note, she was not able to find a HH agency due to pt's insurance. Met with pt and sister Cindy [name]). Cindy [name] reports that pt lives with her and she is her caregiver. She reports that she quit her job to take care of her sister. She has the support of her husband and 2 daughters. Pt has a HH nurse through [facility]. She reports that prior to this admission pt was able to walk. She reports that she is doing a little better but she still requires a lot of help. She reports that she will transport pt home today. Informed Cindy [name] that we were not able to find a [facility] agency. She reports that pt has used [facility] in [place] in the past and they liked them. She reports that Cindy [name] is the person to contact at [facility]. Cindy [name]'s number is [phone] or [phone] (mobile). She reports that her PCP Cindy Cory, NP) ordered the [facility] in the past. Contacted Cindy [name] and left a VM regarding the [facility] referral.   10:47 - Received call from Cindy [name]. She reports that [facility] has been closed. She reports that she now works for [employer] but they don't cover pt's address. I asked Cindy [name] if she knows any other HH agency in pt's area. She reports I can call [place] or [place]. Both agencies were called and they declined the referral per previous CM note. Met with pt and sister. Informed sister that [facility] has been closed and we are not able to find a [facility] agency. Discussed HH services vs SNF. Sister agrees that pt needs therapy and she is agreeable with SNF. She prefers the [facility] because the facility is near her home. Notified MD and SW of the DC  plan to SNF.

## 2022-11-22 NOTE — [place] FL2 (Signed)
[place] MEDICAID FL2 LEVEL OF CARE FORM     IDENTIFICATION  Patient Name: Cindy [name] Birthdate: 09-May-1961 Sex: female Admission Date (Current Location): 11/19/2022  [place] and [place] Number:  Haynes Bast [id number] O Facility and Address:  [facility], [address], [place], [place] [address]      Provider Number: [id number]  Attending Physician Name and Address:  Maretta Bees, MD  Relative Name and Phone Number:  Camyla, Robards (Spouse) [phone]    Current Level of Care: Hospital Recommended Level of Care: Skilled Nursing Facility Prior Approval Number:    Date Approved/Denied:   PASRR Number: [id number]  Discharge Plan: SNF    Current Diagnoses: Patient Active Problem List   Diagnosis Date Noted   Seizure (HCC) 11/19/2022   Dysphagia    Agitation    Malnutrition of moderate degree 08/24/2020   Acute metabolic encephalopathy 08/23/2020   Polysubstance abuse (HCC) 08/23/2020   AKI (acute kidney injury) (HCC) 08/23/2020   History of stroke 08/23/2020   Seizures (HCC) 02/09/2020   Hyponatremia 02/09/2020   SIRS (systemic inflammatory response syndrome) (HCC) 02/09/2020   Cocaine abuse (HCC) 02/09/2020   Ataxia due to recent stroke 08/19/2019   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   Cerebral embolism with cerebral infarction 08/13/2019   Status epilepticus (HCC) 08/05/2019   Acute encephalopathy 08/05/2019   Elevated AST (SGOT) 10/23/2009   HYPERCHOLESTEROLEMIA 07/03/2008   ALCOHOL USE 07/03/2008   Essential hypertension 07/03/2008   STROKE 07/03/2008   DYSPHAGIA UNSPECIFIED 07/03/2008   Glucose intolerance 07/03/2008   Pain in joint, shoulder region 06/22/2008   SPONDYLOSIS 06/22/2008   CERVICALGIA 06/22/2008   SPINAL STENOSIS 06/22/2008   CERVICAL SPASM 06/22/2008    Orientation RESPIRATION BLADDER Height & Weight     Self  Normal Incontinent Weight: 158 lb 1.6 oz (71.7 kg) Height:  5\' 7"  (170.2 cm)   BEHAVIORAL SYMPTOMS/MOOD NEUROLOGICAL BOWEL NUTRITION STATUS      Incontinent Diet (see d/c summary)  AMBULATORY STATUS COMMUNICATION OF NEEDS Skin   Total Care Verbally Normal                       Personal Care Assistance Level of Assistance  Bathing, Feeding, Dressing Bathing Assistance: Limited assistance Feeding assistance: Independent Dressing Assistance: Limited assistance     Functional Limitations Info  Sight, Hearing, Speech Sight Info: Impaired Hearing Info: Adequate Speech Info: Adequate    SPECIAL CARE FACTORS FREQUENCY  PT (By licensed PT), OT (By licensed OT)     PT Frequency: 5x/ week OT Frequency: 5x/ week            Contractures Contractures Info: Not present    Additional Factors Info  Code Status, Allergies, Psychotropic Code Status Info: DNR Allergies Info: No Known Allergies Psychotropic Info: see d/c med list         Current Medications (11/22/2022):  This is the current hospital active medication list Current Facility-Administered Medications  Medication Dose Route Frequency Provider Last Rate Last Admin   acetaminophen (TYLENOL) tablet 650 mg  650 mg Oral Q6H PRN Arrien, York Ram, MD   650 mg at 11/20/22 2055   Or   acetaminophen (TYLENOL) suppository 650 mg  650 mg Rectal Q6H PRN Arrien, York Ram, MD       aspirin EC tablet 81 mg  81 mg Oral Daily Coralie Keens, MD   81 mg at 11/22/22 1051   atorvastatin (LIPITOR) tablet 40 mg  40 mg  Oral QHS Arrien, York Ram, MD   40 mg at 11/21/22 2123   divalproex (DEPAKOTE) DR tablet 500 mg  500 mg Oral BID Charlsie Quest, MD   500 mg at 11/22/22 1049   enoxaparin (LOVENOX) injection 40 mg  40 mg Subcutaneous Q24H Coralie Keens, MD   40 mg at 11/21/22 1119   folic acid (FOLVITE) tablet 1 mg  1 mg Oral Daily Arrien, York Ram, MD   1 mg at 11/22/22 1051   lacosamide (VIMPAT) tablet 100 mg  100 mg Oral BID Charlsie Quest, MD   100 mg at 11/22/22  1050   lisinopril (ZESTRIL) tablet 5 mg  5 mg Oral Daily Arrien, York Ram, MD   5 mg at 11/21/22 1111   melatonin tablet 3 mg  3 mg Oral QHS Coralie Keens, MD   3 mg at 11/21/22 2123   multivitamin with minerals tablet 1 tablet  1 tablet Oral Daily Arrien, York Ram, MD   1 tablet at 11/22/22 1051   OLANZapine (ZYPREXA) tablet 5 mg  5 mg Oral q AM Reome, Earle J, RPH   5 mg at 11/22/22 1050   And   OLANZapine (ZYPREXA) tablet 10 mg  10 mg Oral QHS Reome, Earle J, RPH   10 mg at 11/21/22 2123   ondansetron (ZOFRAN) tablet 4 mg  4 mg Oral Q6H PRN Arrien, York Ram, MD       Or   ondansetron [facility]) injection 4 mg  4 mg Intravenous Q6H PRN Arrien, York Ram, MD       pantoprazole (PROTONIX) EC tablet 40 mg  40 mg Oral Daily Arrien, York Ram, MD   40 mg at 11/22/22 1051     Discharge Medications: Please see discharge summary for a list of discharge medications.  Relevant Imaging Results:  Relevant Lab Results:   Additional Information ZO#[id number]  Ralene Bathe, LCSW

## 2022-11-23 ENCOUNTER — Other Ambulatory Visit: Payer: Self-pay | Admitting: Family Medicine

## 2022-11-23 DIAGNOSIS — R569 Unspecified convulsions: Secondary | ICD-10-CM | POA: Diagnosis not present

## 2022-11-23 MED ORDER — DIVALPROEX SODIUM 500 MG PO DR TAB: 500.0000 mg | DELAYED_RELEASE_TABLET | Freq: Two times a day (BID) | ORAL | 2 refills | Status: AC

## 2022-11-23 MED ORDER — LACOSAMIDE 200 MG PO TABS: 200.0000 mg | ORAL_TABLET | Freq: Two times a day (BID) | ORAL | 2 refills | Status: DC

## 2022-11-23 NOTE — Discharge Summary (Signed)
Physician Discharge Summary   Patient: Cindy [name] MRN: [id number] DOB: 05/08/1961  Admit date:     11/19/2022  Discharge date: 11/23/22  Discharge Physician: Alberteen Sam   PCP: Shelby Dubin, FNP     Recommendations at discharge:  Follow-up with [facility] neurology, Dr. 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  Patient was admitted underwent MRI brain that showed no acute intracranial abnormality.  CT angiogram of the head and neck showed no significant atherosclerotic disease.  Neurology were consulted and she underwent long-term EEG that showed no seizures.  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  She was continued on Depakote, and Vimpat was added back as she had stopped this at some point.  Recommend follow up with primary Neurologist in 2-4 weeks  SNF was recommended for rehabilitation but family declined.        [facility] Controlled Substances Registry was reviewed for this patient prior to discharge.   Consultants: Neurology Procedures performed: LTM EEG MRI brain CTA head and neck  Disposition: Home Diet recommendation:  Discharge Diet Orders (From admission, onward)     Start     Ordered   11/22/22 0000  Diet - low sodium heart healthy        11/22/22 0844             DISCHARGE MEDICATION: Allergies as of 11/23/2022   No Known Allergies      Medication List     STOP taking these medications    amLODipine 10 MG tablet Commonly known as: NORVASC   metoprolol  tartrate 25 MG tablet Commonly known as: LOPRESSOR       TAKE these medications    acetaminophen 325 MG tablet Commonly known as: TYLENOL Take 2 tablets (650 mg total) by mouth every 6 (six) hours as needed (pain).   aspirin 325 MG tablet Take 1 tablet (325 mg total) by mouth daily.   atorvastatin 40 MG tablet Commonly known as: LIPITOR Take 1 tablet (40 mg total) by mouth at bedtime.   divalproex 500 MG DR tablet Commonly known as: DEPAKOTE Take 1 tablet (500 mg total) by mouth 2 (two) times daily. What changed: when to take this   folic acid 1 MG tablet Commonly known as: FOLVITE Take 1 tablet (1 mg total) by mouth daily.   lisinopril 5 MG tablet Commonly known as: ZESTRIL Take 2.5 mg by mouth daily.   melatonin 3 MG Tabs tablet Take 1 tablet (3 mg total) by mouth at bedtime.   multivitamin with minerals Tabs tablet Take 1 tablet by mouth daily.   OLANZapine 2.5 MG tablet Commonly known as: ZYPREXA take 1 tablet by mouth daily and  take 2 tablets at bedtime   pantoprazole 40 MG tablet Commonly known as: PROTONIX Take 1 tablet (40 mg total) by mouth daily.   Valtoco 15 MG Dose 2 x 7.5 MG/0.1ML Lqpk Generic drug: diazePAM (15 MG Dose) Place 7.5 mg into the nose once as needed for up to 1 dose (valtoco ( 7.5 mg in  each nostril) for seizure lasting more than 2 minutes.).   Vimpat 200 MG Tabs tablet Generic drug: lacosamide Take 1 tablet (200 mg total) by mouth 2 (two) times daily.               Durable Medical Equipment  (From admission, onward)           Start     Ordered   11/21/22 1355  For home use only DME Other see comment  Once       Comments: HOYER LIFT  Question:  Length of Need  Answer:  6 Months   11/21/22 1354            Follow-up Information     [name], Julian Reil, FNP. Schedule an appointment as soon as possible for a visit in 1 week(s).   Specialty: Family Medicine Contact information: [address] [place]  [address] [phone]         Morene Crocker, MD. Schedule an appointment as soon as possible for a visit in 1 week(s).   Specialty: Neurology Contact information: [address] [facility] [place] [address] [phone]                 Discharge Instructions     Call MD for:  extreme fatigue   Complete by: As directed    Call MD for:  persistant nausea and vomiting   Complete by: As directed    Call MD for:  severe uncontrolled pain   Complete by: As directed    Diet - low sodium heart healthy   Complete by: As directed    Discharge instructions   Complete by: As directed    Follow with Primary MD  [name], Cindy C, FNP in 1-2 weeks  Follow-up with [facility] neurology in the next 1-2 weeks  Please get a complete blood count and chemistry panel checked by your Primary MD at your next visit, and again as instructed by your Primary MD.  Get Medicines reviewed and adjusted: Please take all your medications with you for your next visit with your Primary MD  Laboratory/radiological data: Please request your Primary MD to go over all hospital tests and procedure/radiological results at the follow up, please ask your Primary MD to get all Hospital records sent to his/her office.  In some cases, they will be blood work, cultures and biopsy results pending at the time of your discharge. Please request that your primary care M.D. follows up on these results.  Also Note the following: If you experience worsening of your admission symptoms, develop shortness of breath, life threatening emergency, suicidal or homicidal thoughts you must seek medical attention immediately by calling 911 or calling your MD immediately  if symptoms less severe.  You must read complete instructions/literature along with all the possible adverse reactions/side effects for all the Medicines you take and that have been prescribed to you. Take any new Medicines after you  have completely understood and accpet all the possible adverse reactions/side effects.   Do not drive when taking Pain medications or sleeping medications (Benzodaizepines)  Do not take more than prescribed Pain, Sleep and Anxiety Medications. It is not advisable to combine anxiety,sleep and pain medications without talking with your primary care practitioner  Special Instructions: If you have smoked or chewed Tobacco  in the last 2 yrs please stop smoking, stop any regular Alcohol  and or any Recreational drug use.  Wear Seat belts while driving.  Please note: You were cared for by a hospitalist during your hospital stay. Once you are discharged, your primary care physician will handle any further medical issues. Please note that NO REFILLS for any discharge medications will be authorized once you are discharged, as it is imperative that you return to your primary care physician (or establish a relationship with a primary care physician if you do not have one) for your post hospital discharge needs so that they can reassess your need for medications and monitor your lab values.     Seizure precautions: Per [facility] statutes, patients with seizures are not allowed to drive until they have been seizure-free for six months and cleared by a physician    Use caution when using heavy equipment or power tools. Avoid working on ladders or at heights. Take showers instead of baths. Ensure the water temperature is not too high on the home water heater. Do not go swimming alone. Do not lock yourself in a room alone (i.e. bathroom). When caring for infants or small children, sit down when holding, feeding, or changing them to minimize risk of injury to the child in the event you have a seizure. Maintain good sleep hygiene. Avoid alcohol.    If patient has another seizure, call 911 and bring them back to the ED if: A.  The seizure lasts longer than 5 minutes.      B.  The patient doesn't wake  shortly after the seizure or has new problems such as difficulty seeing, speaking or moving following the seizure C.  The patient was injured during the seizure D.  The patient has a temperature over 102 F (39C) E.  The patient vomited during the seizure and now is having trouble breathing    During the Seizure   - First, ensure adequate ventilation and place patients on the floor on their left side  Loosen clothing around the neck and ensure the airway is patent. If the patient is clenching the teeth, do not force the mouth open with any object as this can cause severe damage - Remove all items from the surrounding that can be hazardous. The patient may be oblivious to what's happening and may not even know what he or she is doing. If the patient is confused and wandering, either gently guide him/her away and block access to outside areas - Reassure the individual and be comforting - Call 911. In most cases, the seizure ends before EMS arrives. However, there are cases when seizures may last over 3 to 5 minutes. Or the individual may have developed breathing difficulties or severe injuries. If a pregnant patient or a person with diabetes develops a seizure, it is prudent to call an ambulance. - Finally, if the patient does not regain full consciousness, then call EMS. Most patients will remain confused for about 45 to 90 minutes after a seizure, so you must use judgment in calling for help. - Avoid restraints but make sure the patient is in a bed with padded side rails - Place the individual in a lateral position with the neck slightly flexed; this will help the saliva drain from the mouth and prevent the tongue from falling backward - Remove all nearby furniture and other hazards from the area - Provide verbal assurance as the individual is regaining consciousness - Provide the patient with privacy if possible - Call for help and start treatment as ordered by the caregiver    After the Seizure  (Postictal Stage)  After a seizure, most patients experience confusion, fatigue, muscle pain and/or a headache. Thus, one should permit the individual to sleep. For the next few days, reassurance is essential. Being calm and helping reorient the person is also of importance.   Most seizures are painless and end spontaneously. Seizures are not harmful to others but can lead to complications such as stress on the lungs, brain and the heart. Individuals with prior lung problems may develop labored breathing and respiratory distress.    Increase activity slowly   Complete by: As directed        Discharge Exam: Filed Weights   11/19/22 0749  Weight: 71.7 kg    General: Pt is alert, awake, not in acute distress Cardiovascular: RRR, nl S1-S2, no murmurs appreciated.   No LE edema.   Respiratory: Normal respiratory rate and rhythm.  CTAB without rales or wheezes. Abdominal: Abdomen soft and non-tender.  No distension or HSM.   Neuro/Psych: Strength symmetric in upper and lower extremities.  Judgment and insight appear impaired but close to baseline.   Condition at discharge: stable  The results of significant diagnostics from this hospitalization (including imaging, microbiology, ancillary and laboratory) are listed below for reference.   Imaging Studies: Overnight EEG with video  Result Date: 11/20/2022 Charlsie Quest, MD     11/21/2022  9:20 AM Patient Name: LENE COOKE MRN: [id number] Epilepsy Attending: Charlsie Quest Referring Physician/Provider: Jefferson Fuel, MD Duration: 11/19/2022 1955 to 11/20/2022 1955 Patient history: 62yo F with h/o seizures getting eeg to evaluate for seizure. Level of alertness: Awake, asleep AEDs during EEG study: LCM, VPA Technical aspects: This EEG study was done with scalp electrodes positioned according to the 10-20 International system of electrode placement. Electrical activity was reviewed with band pass filter of 1-70Hz , sensitivity of 7 uV/mm,  display speed of 43mm/sec with a 60Hz  notched filter applied as appropriate. EEG data were recorded continuously and digitally stored.  Video monitoring was available and reviewed as appropriate. Description: The posterior dominant rhythm consists of 8 Hz activity of moderate voltage (25-35 uV) seen predominantly in posterior head regions, asymmetric ( L<R) and reactive to eye opening and eye closing. Drowsiness was characterized by attenuation of the posterior background rhythm. Sleep was characterized by vertex waves, sleep spindles (12 to 14 Hz), maximal frontocentral region. EEG showed continuous 3 to 6 Hz theta-delta slowing in left heisphere. Hyperventilation and photic stimulation were not performed.   ABNORMALITY - Continuous slow, Left hemisphere - Background asymmetry, left <right IMPRESSION: This study is suggestive of cortical dysfunction arising from left hemisphere likely secondary to underlying structural abnormality, post-ictal state. No seizures or definite epileptiform discharges were seen throughout the recording. Charlsie Quest   ECHOCARDIOGRAM COMPLETE  Result Date: 11/19/2022    ECHOCARDIOGRAM REPORT   Patient Name:   ZAELYNN BEVILLE Date of Exam: 11/19/2022 Medical Rec #:  [id number]    Height:       67.0 in Accession #:    [id number]   Weight:       158.1 lb Date of Birth:  [birth year]   BSA:          1.830 m Patient Age:    61 years     BP:           147/86 mmHg Patient Gender: F            HR:           92 bpm. [facility]am [facility] Procedure: 2D Echo, Cardiac Doppler and Color Doppler Indications:    TIA G45.9  History:        Patient has prior history of Echocardiogram examinations, most                 recent 08/06/2019. Stroke; Risk Factors:Hypertension and                 Dyslipidemia. ALCOHOL USE, Cocaine abuse (HCC).  Sonographer:    Celesta Gentile RCS Referring Phys: [id number] MAURICIO DANIEL ARRIEN IMPRESSIONS  1. Left ventricular ejection fraction, by estimation, is 65 to 70%. The  left ventricle has normal function. The left ventricle has no regional wall motion abnormalities. There is mild left ventricular hypertrophy. Left ventricular diastolic parameters are consistent with Grade I diastolic dysfunction (impaired relaxation).  2. Right ventricular systolic function is normal. The right ventricular size is normal.  3. The mitral valve is normal in structure. No evidence of mitral valve regurgitation. No evidence of mitral stenosis.  4. The aortic valve is tricuspid. Aortic valve regurgitation is not visualized. No aortic stenosis is present.  5. The inferior vena cava is normal in size with greater than 50% respiratory variability, suggesting right atrial pressure of 3 mmHg. FINDINGS  Left Ventricle: Left ventricular ejection fraction, by estimation, is 65 to 70%. The left ventricle has normal function. The left ventricle has no regional wall motion abnormalities. Definity contrast agent was given IV to delineate the left ventricular  endocardial borders. The left ventricular internal cavity size was normal in size. There is mild left ventricular hypertrophy. Left ventricular diastolic parameters are consistent with Grade I diastolic dysfunction (impaired relaxation). Normal left ventricular filling pressure. Right Ventricle: The right ventricular size is normal. Right vetricular wall thickness was not well visualized. Right ventricular systolic function is normal. Left Atrium: Left atrial size was normal in size. Right Atrium: Right atrial size was normal in size. Pericardium: There is no evidence of pericardial effusion. Mitral Valve: The mitral valve is normal in structure. No evidence of mitral valve regurgitation. No evidence of mitral valve stenosis. Tricuspid Valve: The tricuspid valve is normal in structure. Tricuspid valve regurgitation is not demonstrated. No evidence of tricuspid stenosis. Aortic Valve: The aortic valve is tricuspid. Aortic valve regurgitation is not visualized.  No aortic stenosis is present. Aortic valve mean gradient measures 2.7 mmHg. Aortic valve peak gradient measures 5.5 mmHg. Aortic valve area, by VTI measures 2.15 cm. Pulmonic Valve: The pulmonic valve was not well visualized. Pulmonic valve regurgitation is not visualized. No evidence of pulmonic stenosis. Aorta: The aortic root is normal in size and structure. Venous: The inferior vena cava is normal in size with greater than 50% respiratory variability, suggesting right atrial pressure of 3 mmHg. IAS/Shunts: No atrial level shunt detected by color flow Doppler.  LEFT VENTRICLE PLAX 2D LVIDd:         3.70 cm   Diastology LVIDs:         1.90 cm   LV e' medial:    5.98 cm/s LV PW:         1.20 cm   LV E/e' medial:  7.8 LV IVS:        1.10 cm   LV e' lateral:   7.40 cm/s LVOT diam:     1.60 cm   LV E/e' lateral: 6.3 LV SV:         46 LV SV Index:   25 LVOT Area:     2.01 cm  RIGHT VENTRICLE RV S prime:     16.30 cm/s TAPSE (M-mode): 2.0 cm LEFT ATRIUM             Index        RIGHT ATRIUM          Index LA diam:        2.30 cm 1.26 cm/m   RA Area:     8.50 cm LA Vol (A2C):   23.5 ml 12.84 ml/m  RA Volume:   15.60 ml 8.52 ml/m LA Vol (A4C):   28.7 ml 15.68 ml/m LA Biplane Vol: 27.9 ml 15.25 ml/m  AORTIC VALVE AV Area (Vmax):    1.79 cm AV Area (Vmean):   2.06 cm AV Area (VTI):     2.15 cm AV Vmax:           117.71 cm/s AV Vmean:          75.413 cm/s AV VTI:            0.212 m AV Peak Grad:      5.5 mmHg AV Mean Grad:      2.7 mmHg LVOT Vmax:         105.00 cm/s LVOT Vmean:        77.200 cm/s LVOT VTI:          0.227 m LVOT/AV VTI ratio: 1.07  AORTA Ao Root diam: 3.40 cm MITRAL VALVE MV Area (PHT): 5.38 cm    SHUNTS MV Decel Time: 141 msec    Systemic VTI:  0.23 m MV E velocity: 46.60 cm/s  Systemic Diam: 1.60 cm MV A velocity: 86.40 cm/s MV E/A ratio:  0.54 Dina Rich MD Electronically signed by Dina Rich MD Signature Date/Time: 11/19/2022/3:22:38 PM    Final    MR Brain W and Wo  Contrast  Result Date: 11/19/2022 CLINICAL DATA:  Provided history: Neuro deficit, acute, stroke suspected. EXAM: MRI HEAD WITHOUT AND WITH CONTRAST TECHNIQUE: Multiplanar, multiecho pulse sequences of the brain and surrounding structures were obtained without and with intravenous contrast. CONTRAST:  7mL GADAVIST GADOBUTROL 1 MMOL/ML IV SOLN COMPARISON:  Non-contrast head CT and CT angiogram head/neck 11/19/2022. Brain MRI 08/28/2020. FINDINGS: Intermittently motion degraded examination. Most notably, the axial T1-weighted sequence is moderate-to-severely motion degraded, the axial T1-weighted post-contrast sequence is severely motion degraded, the coronal T1-weighted post-contrast sequence is moderate-to-severely motion degraded and the sagittal T1-weighted post-contrast sequence is severely motion degraded. Within this limitation, findings are as follows. Brain: Generalized cerebral atrophy which is significantly advanced for age. Mild-to-moderate cerebellar atrophy. Commensurate prominence of the ventricles and sulci. Chronic lacunar infarcts within the bilateral cerebral hemispheric white matter and bilateral deep gray nuclei. Background advanced patchy and confluent T2 FLAIR hyperintense signal abnormality within the cerebral white matter. Small chronic infarct within the medial left cerebellar hemisphere. There is no acute infarct. No evidence of an intracranial mass. No chronic intracranial blood products. No extra-axial fluid collection. No midline shift. No definite pathologic intracranial enhancement is identified, although evaluation is markedly limited due to motion degradation of the post-contrast imaging. Vascular: Maintained flow voids within the proximal large arterial vessels. Skull and upper cervical spine: No focal suspicious marrow lesion. Incompletely assessed cervical spondylosis. Sinuses/Orbits: No mass or acute finding within the imaged orbits. Mild mucosal thickening within the right  maxillary sinus. Other: Small-volume fluid within the bilateral mastoid air cells. IMPRESSION: 1. Significantly motion degraded examination, as described. 2. Within this limitation, no acute intracranial abnormality is identified. The diffusion-weighted imaging is  of good quality and there is no evidence of an acute infarct. 3. Redemonstrated chronic lacunar infarcts within the bilateral cerebral hemispheric white matter and within the bilateral deep gray nuclei. 4. Redemonstrated small chronic infarct within the left cerebellar hemisphere. 5. Background advanced cerebral white matter disease, nonspecific but favored to reflect sequelae of chronic small vessel ischemia (given the presence of multiple chronic infarcts within the supratentorial and infratentorial brain). 6. Cerebral and cerebellar atrophy, significantly advanced for age. 7. Mild right maxillary sinus mucosal thickening. 8. Small-volume fluid within the bilateral mastoid air cells. Electronically Signed   By: Jackey Loge D.O.   On: 11/19/2022 12:44   CT ANGIO HEAD NECK W WO CM  Result Date: 11/19/2022 CLINICAL DATA:  Neuro deficit, acute, stroke suspected. Seizure presentation. EXAM: CT ANGIOGRAPHY HEAD AND NECK WITH AND WITHOUT CONTRAST TECHNIQUE: Multidetector CT imaging of the head and neck was performed using the standard protocol during bolus administration of intravenous contrast. Multiplanar CT image reconstructions and MIPs were obtained to evaluate the vascular anatomy. Carotid stenosis measurements (when applicable) are obtained utilizing NASCET criteria, using the distal internal carotid diameter as the denominator. RADIATION DOSE REDUCTION: This exam was performed according to the departmental dose-optimization program which includes automated exposure control, adjustment of the mA and/or kV according to patient size and/or use of iterative reconstruction technique. CONTRAST:  75mL OMNIPAQUE IOHEXOL 350 MG/ML SOLN COMPARISON:  Head CT  same day.  MRI 08/28/2020. FINDINGS: CTA NECK FINDINGS Aortic arch: Aortic arch appears normal. Right carotid system: Common carotid artery widely patent to the bifurcation. Mild atherosclerotic plaque at the carotid bifurcation and ICA bulb but no stenosis. Cervical ICA beyond that is tortuous but widely patent. Left carotid system: Common carotid artery widely patent to the bifurcation. Atherosclerotic plaque at the bifurcation and ICA bulb but no stenosis. Cervical ICA beyond that is tortuous but widely patent. Vertebral arteries: Both vertebral artery origins are widely patent. Both vertebral arteries appear normal through the cervical region to the foramen magnum. Skeleton: Ordinary mid cervical degenerative spondylosis. Other neck: No mass or lymphadenopathy. Upper chest: Lung apices are clear. Review of the MIP images confirms the above findings CTA HEAD FINDINGS Anterior circulation: Both internal carotid arteries are patent through the skull base and siphon regions. Ordinary siphon atherosclerotic calcification but no stenosis. The anterior and middle cerebral vessels are patent. No large vessel occlusion or proximal stenosis. 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 No aneurysm or vascular malformation. Posterior circulation: Both vertebral arteries are patent to the basilar artery. There is atherosclerotic calcification in both vertebral artery V4 segments with narrowing estimated at 30-50% on both sides. No basilar stenosis. Superior cerebellar and posterior cerebral arteries appear normal. Venous sinuses: Patent and normal. Anatomic variants: None significant otherwise. Review of the MIP images confirms the above findings IMPRESSION: 1. No large vessel occlusion. 2. Atherosclerotic disease at both carotid bifurcations but without stenosis. 3. 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 Electronically Signed   By: Paulina Fusi M.D.    On: 11/19/2022 10:52   CT HEAD WO CONTRAST  Result Date: 11/19/2022 CLINICAL DATA:  Mental status change, unknown cause EXAM: CT HEAD WITHOUT CONTRAST TECHNIQUE: Contiguous axial images were obtained from the base of the skull through the vertex without intravenous contrast. RADIATION DOSE REDUCTION: This exam was performed according to the departmental dose-optimization program which includes automated exposure control, adjustment of the mA and/or kV according to patient size  and/or use of iterative reconstruction technique. COMPARISON:  CT Head 08/23/20, Brain MR 08/23/20, 08/28/20 FINDINGS: Brain: No evidence of acute infarction, hemorrhage, hydrocephalus, extra-axial collection or mass lesion/mass effect. There is sequela moderate chronic microvascular ischemic change with advanced generalized volume loss. Chronic right thalamic infarct chronic infarct in the right basal ganglia. Vascular: No hyperdense vessel or unexpected calcification. Skull: Normal. Negative for fracture or focal lesion. Sinuses/Orbits: No middle ear or mastoid effusion. Paranasal sinuses are notable for mucosal thickening in the right maxillary sinus. Orbits are unremarkable. Other: None. IMPRESSION: No acute intracranial abnormality. Electronically Signed   By: Lorenza Cambridge M.D.   On: 11/19/2022 08:47   DG Chest Port 1 View  Result Date: 11/19/2022 CLINICAL DATA:  Provided history: Altered mental status.  Seizure. EXAM: PORTABLE CHEST 1 VIEW COMPARISON:  Prior chest radiographs 09/02/2020 and earlier. FINDINGS: Shallow inspiration radiograph with accentuation of the cardiac silhouette. Taking this into account, heart size appears at the upper limits of normal. No appreciable airspace consolidation or pulmonary edema. No evidence of pleural effusion or pneumothorax. No acute osseous abnormality identified. IMPRESSION: Shallow inspiration radiograph. No evidence of an acute cardiopulmonary abnormality. Electronically Signed   By: Jackey Loge D.O.   On: 11/19/2022 08:11    Microbiology: Results for orders placed or performed during the hospital encounter of 08/31/20  Culture, Urine     Status: None   Collection Time: 09/04/20 12:18 PM   Specimen: Urine, Random  Result Value Ref Range Status   Specimen Description URINE, RANDOM  Final   Special Requests NONE  Final   Culture   Final    NO GROWTH Performed at [facility], [address] [place], [place] [address]    Report Status 09/05/2020 FINAL  Final    Labs: CBC: Recent Labs  Lab 11/19/22 0744 11/20/22 0207 11/21/22 0445  WBC 9.7 10.4 6.9  NEUTROABS 7.6  --  4.3  HGB 11.6* 11.0* 11.1*  HCT 34.5* 32.0* 32.7*  MCV 95.0 92.2 92.6  PLT 200 193 187   Basic Metabolic Panel: Recent Labs  Lab 11/19/22 0744 11/20/22 0207 11/21/22 0445  NA 130* 130* 130*  K 4.8 4.0 3.7  CL 99 94* 100  CO2 22 21* 22  GLUCOSE 112* 102* 112*  BUN 18 15 23   CREATININE 1.28* 1.20* 1.20*  CALCIUM 9.0 9.0 8.7*  MG 1.7  --  1.7   Liver Function Tests: Recent Labs  Lab 11/19/22 0744  AST 21  ALT 12  ALKPHOS 88  BILITOT 0.5  PROT 7.5  ALBUMIN 3.2*   CBG: Recent Labs  Lab 11/19/22 0751  GLUCAP 98    Discharge time spent: approximately 35 minutes spent on discharge counseling, evaluation of patient on day of discharge, and coordination of discharge planning with nursing, social work, pharmacy and case management  Signed: Alberteen Sam, MD [facility] 11/23/2022

## 2022-11-23 NOTE — TOC Transition Note (Addendum)
Transition of Care [facility]) - CM/SW Discharge Note   Patient Details  Name: Cindy [name] MRN: [id number] Date of Birth: [birth year]  [name]) CM/SW Contact:  Lawerance Sabal, RN Phone Number: 11/23/2022, 9:31 AM   Clinical Narrative:     0930 Spoke w spouse over the phone, he states that he lives about 20 miles from patient and her sister Cindy [name] with whom she lives who serves as her PCS through [employer].  Spouse requested I call Tracey.  Spoke w Cindy [name] and she confirms 24/7 supervision, and all needed DME is at home, bed hoyer etc.  Cindy [name] understands that we were not able to align any additional HH support due to payor source and she confirms she would like to resume care at home.  Cindy [name] states she will transport the patient home.  11:15 PTAR forms placed on chart as well as DNR form for MD to sign, in likely event that patient will need PTAR. RN updated.  MD updated.   Final next level of care: Home/Self Care Barriers to Discharge: No Barriers Identified   Patient Goals and CMS Choice CMS Medicare.gov Compare Post Acute Care list provided to:: Other (Comment Required) Choice offered to / list presented to : Sibling, Spouse  Discharge Placement                         Discharge Plan and Services Additional resources added to the After Visit Summary for                  DME Arranged: N/A         HH Arranged: [employer] Program          Social Determinants of Health (SDOH) Interventions SDOH Screenings   Food Insecurity: Patient Unable To Answer (11/20/2022)  Housing: High Risk (11/20/2022)  Transportation Needs: Patient Unable To Answer (11/20/2022)  Utilities: Patient Unable To Answer (11/20/2022)  Tobacco Use: Low Risk  (11/19/2022)     Readmission Risk Interventions     No data to display

## 2023-08-19 ENCOUNTER — Inpatient Hospital Stay (HOSPITAL_COMMUNITY)
Admit: 2023-08-19 | Discharge: 2023-08-19 | Disposition: A | Discharge status: Home / Self Care | Attending: Internal Medicine

## 2023-08-19 ENCOUNTER — Encounter (HOSPITAL_COMMUNITY): Payer: Self-pay | Admitting: *Deleted

## 2023-08-19 ENCOUNTER — Other Ambulatory Visit: Payer: Self-pay

## 2023-08-19 ENCOUNTER — Inpatient Hospital Stay (HOSPITAL_COMMUNITY)
Admission: EM | Admit: 2023-08-19 | Discharge: 2023-08-22 | DRG: 645 | Disposition: A | Discharge status: Home / Self Care | Attending: Internal Medicine | Admitting: Internal Medicine

## 2023-08-19 ENCOUNTER — Emergency Department (HOSPITAL_COMMUNITY)

## 2023-08-19 DIAGNOSIS — Z8673 Personal history of transient ischemic attack (TIA), and cerebral infarction without residual deficits: Secondary | ICD-10-CM

## 2023-08-19 DIAGNOSIS — E222 Syndrome of inappropriate secretion of antidiuretic hormone: Secondary | ICD-10-CM | POA: Diagnosis present

## 2023-08-19 DIAGNOSIS — R569 Unspecified convulsions: Secondary | ICD-10-CM

## 2023-08-19 DIAGNOSIS — Z79899 Other long term (current) drug therapy: Secondary | ICD-10-CM | POA: Diagnosis not present

## 2023-08-19 DIAGNOSIS — Z8249 Family history of ischemic heart disease and other diseases of the circulatory system: Secondary | ICD-10-CM | POA: Diagnosis not present

## 2023-08-19 DIAGNOSIS — Z7982 Long term (current) use of aspirin: Secondary | ICD-10-CM

## 2023-08-19 DIAGNOSIS — E785 Hyperlipidemia, unspecified: Secondary | ICD-10-CM | POA: Diagnosis present

## 2023-08-19 DIAGNOSIS — E871 Hypo-osmolality and hyponatremia: Secondary | ICD-10-CM | POA: Diagnosis present

## 2023-08-19 DIAGNOSIS — E875 Hyperkalemia: Secondary | ICD-10-CM | POA: Diagnosis present

## 2023-08-19 DIAGNOSIS — N1831 Chronic kidney disease, stage 3a: Secondary | ICD-10-CM | POA: Diagnosis present

## 2023-08-19 DIAGNOSIS — R32 Unspecified urinary incontinence: Secondary | ICD-10-CM | POA: Diagnosis present

## 2023-08-19 DIAGNOSIS — Z66 Do not resuscitate: Secondary | ICD-10-CM | POA: Diagnosis present

## 2023-08-19 DIAGNOSIS — I129 Hypertensive chronic kidney disease with stage 1 through stage 4 chronic kidney disease, or unspecified chronic kidney disease: Secondary | ICD-10-CM | POA: Diagnosis present

## 2023-08-19 DIAGNOSIS — G40909 Epilepsy, unspecified, not intractable, without status epilepticus: Principal | ICD-10-CM | POA: Diagnosis present

## 2023-08-19 LAB — BASIC METABOLIC PANEL WITH GFR
Anion gap: 13 (ref 5–15)
BUN: 14 mg/dL (ref 8–23)
CO2: 19 mmol/L — ABNORMAL LOW (ref 22–32)
Calcium: 8.9 mg/dL (ref 8.9–10.3)
Chloride: 89 mmol/L — ABNORMAL LOW (ref 98–111)
Creatinine, Ser: 0.94 mg/dL (ref 0.44–1.00)
GFR, Estimated: >60 mL/min (ref >60)
Glucose, Bld: 92 mg/dL (ref 70–99)
Potassium: 4.8 mmol/L (ref 3.5–5.1)
Sodium: 121 mmol/L — ABNORMAL LOW (ref 135–145)

## 2023-08-19 LAB — CBC WITH DIFFERENTIAL/PLATELET
Abs Immature Granulocytes: 0.07 10*3/uL (ref 0.00–0.07)
Basophils Absolute: 0 10*3/uL (ref 0.0–0.1)
Basophils Relative: 0 %
Eosinophils Absolute: 0 10*3/uL (ref 0.0–0.5)
Eosinophils Relative: 0 %
HCT: 34.4 % — ABNORMAL LOW (ref 36.0–46.0)
Hemoglobin: 12 g/dL (ref 12.0–15.0)
Immature Granulocytes: 1 %
Lymphocytes Relative: 10 %
Lymphs Abs: 1 10*3/uL (ref 0.7–4.0)
MCH: 32.1 pg (ref 26.0–34.0)
MCHC: 34.9 g/dL (ref 30.0–36.0)
MCV: 92 fL (ref 80.0–100.0)
Monocytes Absolute: 0.9 10*3/uL (ref 0.1–1.0)
Monocytes Relative: 9 %
Neutro Abs: 7.7 10*3/uL (ref 1.7–7.7)
Neutrophils Relative %: 80 %
Platelets: 244 10*3/uL (ref 150–400)
RBC: 3.74 MIL/uL — ABNORMAL LOW (ref 3.87–5.11)
RDW: 12.9 % (ref 11.5–15.5)
WBC: 9.6 10*3/uL (ref 4.0–10.5)
nRBC: 0 % (ref 0.0–0.2)

## 2023-08-19 LAB — COMPREHENSIVE METABOLIC PANEL WITH GFR
ALT: 17 U/L (ref 0–44)
AST: 21 U/L (ref 15–41)
Albumin: 3.2 g/dL — ABNORMAL LOW (ref 3.5–5.0)
Alkaline Phosphatase: 90 U/L (ref 38–126)
Anion gap: 10 (ref 5–15)
BUN: 17 mg/dL (ref 8–23)
CO2: 22 mmol/L (ref 22–32)
Calcium: 9.1 mg/dL (ref 8.9–10.3)
Chloride: 87 mmol/L — ABNORMAL LOW (ref 98–111)
Creatinine, Ser: 1.02 mg/dL — ABNORMAL HIGH (ref 0.44–1.00)
GFR, Estimated: >60 mL/min (ref >60)
Glucose, Bld: 108 mg/dL — ABNORMAL HIGH (ref 70–99)
Potassium: 5.2 mmol/L — ABNORMAL HIGH (ref 3.5–5.1)
Sodium: 119 mmol/L — CL (ref 135–145)
Total Bilirubin: 0.4 mg/dL (ref 0.0–1.2)
Total Protein: 7.3 g/dL (ref 6.5–8.1)

## 2023-08-19 LAB — OSMOLALITY, URINE: Osmolality, Ur: 464 mosm/kg (ref 300–900)

## 2023-08-19 LAB — TSH: TSH: 5.006 u[IU]/mL — ABNORMAL HIGH (ref 0.350–4.500)

## 2023-08-19 LAB — SODIUM, URINE, RANDOM: Sodium, Ur: 147 mmol/L

## 2023-08-19 LAB — SODIUM
Sodium: 120 mmol/L — ABNORMAL LOW (ref 135–145)
Sodium: 118 mmol/L — CL (ref 135–145)
Sodium: 120 mmol/L — ABNORMAL LOW (ref 135–145)

## 2023-08-19 LAB — CORTISOL: Cortisol, Plasma: 10.4 ug/dL

## 2023-08-19 LAB — MRSA NEXT GEN BY PCR, NASAL: MRSA by PCR Next Gen: NOT DETECTED

## 2023-08-19 LAB — VALPROIC ACID LEVEL: Valproic Acid Lvl: 52 ug/mL (ref 50.0–100.0)

## 2023-08-19 LAB — OSMOLALITY: Osmolality: 266 mosm/kg — ABNORMAL LOW (ref 275–295)

## 2023-08-19 LAB — MAGNESIUM: Magnesium: 1.4 mg/dL — ABNORMAL LOW (ref 1.7–2.4)

## 2023-08-19 MED ORDER — ONDANSETRON HCL 4 MG/2ML IJ SOLN: 4.0000 mg | Freq: Four times a day (QID) | INTRAMUSCULAR | Status: DC | PRN

## 2023-08-19 MED ORDER — FOLIC ACID 1 MG PO TABS
1.0000 mg | ORAL_TABLET | Freq: Every day | ORAL | Status: DC
  Administered 2023-08-19 – 2023-08-22 (×4): 1 mg via ORAL
  Filled 2023-08-19 (×4): qty 1

## 2023-08-19 MED ORDER — ACETAMINOPHEN 325 MG PO TABS: 650.0000 mg | ORAL_TABLET | Freq: Four times a day (QID) | ORAL | Status: DC | PRN

## 2023-08-19 MED ORDER — ASPIRIN 81 MG PO TBEC
81.0000 mg | DELAYED_RELEASE_TABLET | Freq: Every day | ORAL | Status: DC
  Administered 2023-08-19 – 2023-08-22 (×4): 81 mg via ORAL
  Filled 2023-08-19 (×4): qty 1

## 2023-08-19 MED ORDER — DIVALPROEX SODIUM 250 MG PO DR TAB
500.0000 mg | DELAYED_RELEASE_TABLET | Freq: Two times a day (BID) | ORAL | Status: DC
  Administered 2023-08-19 – 2023-08-22 (×6): 500 mg via ORAL
  Filled 2023-08-19 (×6): qty 2

## 2023-08-19 MED ORDER — LORAZEPAM 2 MG/ML IJ SOLN: 2.0000 mg | INTRAMUSCULAR | Status: DC | PRN

## 2023-08-19 MED ORDER — CHLORHEXIDINE GLUCONATE CLOTH 2 % EX PADS
6.0000 | MEDICATED_PAD | Freq: Every day | CUTANEOUS | Status: DC
  Administered 2023-08-20 – 2023-08-22 (×3): 6 via TOPICAL

## 2023-08-19 MED ORDER — SODIUM CHLORIDE 0.9 % IV BOLUS
1000.0000 mL | Freq: Once | INTRAVENOUS | Status: AC
  Administered 2023-08-19: 1000 mL via INTRAVENOUS

## 2023-08-19 MED ORDER — SODIUM CHLORIDE 3 % IV SOLN
INTRAVENOUS | Status: DC
  Filled 2023-08-19 (×3): qty 500

## 2023-08-19 MED ORDER — ONDANSETRON HCL 4 MG PO TABS: 4.0000 mg | ORAL_TABLET | Freq: Four times a day (QID) | ORAL | Status: DC | PRN

## 2023-08-19 MED ORDER — LAMOTRIGINE 25 MG PO TABS
75.0000 mg | ORAL_TABLET | Freq: Two times a day (BID) | ORAL | Status: DC
  Administered 2023-08-19 – 2023-08-22 (×6): 75 mg via ORAL
  Filled 2023-08-19: qty 1
  Filled 2023-08-19: qty 3
  Filled 2023-08-19: qty 1
  Filled 2023-08-19 (×3): qty 3

## 2023-08-19 MED ORDER — LACOSAMIDE 50 MG PO TABS: 200.0000 mg | ORAL_TABLET | Freq: Two times a day (BID) | ORAL | Status: DC

## 2023-08-19 MED ORDER — PANTOPRAZOLE SODIUM 40 MG PO TBEC
40.0000 mg | DELAYED_RELEASE_TABLET | Freq: Every day | ORAL | Status: DC
  Administered 2023-08-19 – 2023-08-22 (×4): 40 mg via ORAL
  Filled 2023-08-19 (×4): qty 1

## 2023-08-19 MED ORDER — SODIUM ZIRCONIUM CYCLOSILICATE 10 G PO PACK
10.0000 g | PACK | Freq: Once | ORAL | Status: AC
  Administered 2023-08-19: 10 g via ORAL
  Filled 2023-08-19: qty 1

## 2023-08-19 MED ORDER — LAMOTRIGINE 25 MG PO TABS
25.0000 mg | ORAL_TABLET | Freq: Once | ORAL | Status: AC
  Administered 2023-08-19: 25 mg via ORAL
  Filled 2023-08-19: qty 1

## 2023-08-19 MED ORDER — SODIUM CHLORIDE 0.9 % IV SOLN
200.0000 mg | Freq: Once | INTRAVENOUS | Status: AC
  Administered 2023-08-19: 200 mg via INTRAVENOUS
  Filled 2023-08-19: qty 20

## 2023-08-19 MED ORDER — HEPARIN SODIUM (PORCINE) 5000 UNIT/ML IJ SOLN
5000.0000 [IU] | Freq: Three times a day (TID) | INTRAMUSCULAR | Status: DC
  Administered 2023-08-19 – 2023-08-22 (×8): 5000 [IU] via SUBCUTANEOUS
  Filled 2023-08-19 (×8): qty 1

## 2023-08-19 MED ORDER — ACETAMINOPHEN 650 MG RE SUPP: 650.0000 mg | Freq: Four times a day (QID) | RECTAL | Status: DC | PRN

## 2023-08-19 MED ORDER — LAMOTRIGINE 100 MG PO TABS
50.0000 mg | ORAL_TABLET | Freq: Once | ORAL | Status: AC
  Administered 2023-08-19: 50 mg via ORAL
  Filled 2023-08-19: qty 1

## 2023-08-19 MED ORDER — VALPROATE SODIUM 100 MG/ML IV SOLN
500.0000 mg | Freq: Once | INTRAVENOUS | Status: AC
  Administered 2023-08-19: 500 mg via INTRAVENOUS
  Filled 2023-08-19: qty 5

## 2023-08-19 MED ORDER — ATORVASTATIN CALCIUM 40 MG PO TABS
40.0000 mg | ORAL_TABLET | Freq: Every day | ORAL | Status: DC
  Administered 2023-08-19 – 2023-08-21 (×3): 40 mg via ORAL
  Filled 2023-08-19 (×3): qty 1

## 2023-08-19 NOTE — Evaluation (Signed)
 Clinical/Bedside Swallow Evaluation Patient Details  Name: Cindy [name] MRN: [id number] Date of Birth: 26-Nov-1960  Today's Date: 08/19/2023 Time: SLP Start Time (ACUTE ONLY): 1638 SLP Stop Time (ACUTE ONLY): 1651 SLP Time Calculation (min) (ACUTE ONLY): 13 min  Past Medical History:  Past Medical History:  Diagnosis Date   Abnormal LFTs    Hx of   CVA (cerebral vascular accident) (HCC) 2005   Hyperlipidemia    Hypertension    Polysubstance dependence including opioid type drug, episodic abuse (HCC)    cocaine/etoh    Seizures (HCC)    Past Surgical History:  Past Surgical History:  Procedure Laterality Date   ESOPHAGOGASTRODUODENOSCOPY N/A 08/16/2019   Procedure: ESOPHAGOGASTRODUODENOSCOPY (EGD);  Surgeon: Vida Rigger, MD;  Location: [facility] ENDOSCOPY;  Service: Endoscopy;  Laterality: N/A;   HPI:  Cindy [name] is a 63 y.o. female with medical history significant for seizures, strokes, CKD stage IIIa, hypertension, and deconditioning/dysarthria who presented to the ED with noted seizure at home at approximately 6:30 AM.  According to sister at bedside who was the witness, seizure lasted approximately 15 minutes and she was given Valtoco injection after sister figured out what was happening.  She was noted to have some urinary incontinence.  No trauma, fevers, chills, or recent infections or illness noted prior to this event.  Sister is adamant that patient is compliant with her home seizure medications and has not missed any dosages.  She denies any change to appetite or increase in fluid consumption.  No nausea, vomiting, or diarrhea noted. BSE requested    Assessment / Plan / Recommendation  Clinical Impression  Clinical swallowing evaluation completed while Pt was sitting upright in bed. Pt consumed all textures and consistencies presented without overt s/sx of aspiration. Pt's sister at bedside reports Pt occasionally demonstrates a delayed cough after eating at baseline. Recommend  downgrade Pt's diet to D3/mech soft and continue with thin liquids. Meds are ok whole with liquids. Thank you for this referral, our service will sign off. SLP Visit Diagnosis: Dysphagia, unspecified (R13.10)       Diet Recommendation Dysphagia 3 (Mech soft);Thin liquid    Liquid Administration via: Cup;Straw Medication Administration: Whole meds with liquid Supervision: Patient able to self feed;Intermittent supervision to cue for compensatory strategies Compensations: Minimize environmental distractions;Slow rate;Small sips/bites Postural Changes: Seated upright at 90 degrees    Other  Recommendations Oral Care Recommendations: Oral care BID    Recommendations for follow up therapy are one component of a multi-disciplinary discharge planning process, led by the attending physician.  Recommendations may be updated based on patient status, additional functional criteria and insurance authorization.  Follow up Recommendations No SLP follow up        Swallow Study   General Date of Onset: 08/19/23 HPI: Cindy [name] is a 63 y.o. female with medical history significant for seizures, strokes, CKD stage IIIa, hypertension, and deconditioning/dysarthria who presented to the ED with noted seizure at home at approximately 6:30 AM.  According to sister at bedside who was the witness, seizure lasted approximately 15 minutes and she was given Valtoco injection after sister figured out what was happening.  She was noted to have some urinary incontinence.  No trauma, fevers, chills, or recent infections or illness noted prior to this event.  Sister is adamant that patient is compliant with her home seizure medications and has not missed any dosages.  She denies any change to appetite or increase in fluid consumption.  No nausea, vomiting, or  diarrhea noted. BSE requested Type of Study: Bedside Swallow Evaluation Previous Swallow Assessment: Inpatient rehab therapy for dysphagia in 2022 recent BSE in 2024  thin/D3 Diet Prior to this Study: Regular;Thin liquids (Level 0) Temperature Spikes Noted: No Respiratory Status: Room air History of Recent Intubation: No Behavior/Cognition: Alert;Cooperative;Pleasant mood Oral Cavity Assessment: Within Functional Limits Oral Care Completed by SLP: Recent completion by staff Oral Cavity - Dentition: Dentures, top Vision: Functional for self-feeding Self-Feeding Abilities: Able to feed self;Needs assist;Needs set up Patient Positioning: Upright in bed Baseline Vocal Quality: Normal Volitional Cough: Strong Volitional Swallow: Able to elicit    Oral/Motor/Sensory Function Overall Oral Motor/Sensory Function: Within functional limits   Ice Chips Ice chips: Within functional limits   Thin Liquid Thin Liquid: Within functional limits    Nectar Thick Nectar Thick Liquid: Not tested   Honey Thick Honey Thick Liquid: Not tested   Puree Puree: Within functional limits   Solid     Solid: Within functional limits     Bryanna Yim H. Romie Levee, CCC-SLP Speech Language Pathologist  Georgetta Haber 08/19/2023,4:51 PM

## 2023-08-19 NOTE — Procedures (Signed)
 Patient Name: Cindy [name]  MRN: [id number]  Epilepsy Attending: Charlsie Quest  Referring Physician/Provider: Erick Blinks, DO  Date: 08/19/2023 Duration: 23.47 mins  Patient history: 64 old female with seizure-like episode.  EEG to evaluate for seizure.  Level of alertness: Awake  AEDs during EEG study: Lamotrigine, Depakote, Vimpat  Technical aspects: This EEG study was done with scalp electrodes positioned according to the 10-20 International system of electrode placement. Electrical activity was reviewed with band pass filter of 1-70Hz , sensitivity of 7 uV/mm, display speed of 11mm/sec with a 60Hz  notched filter applied as appropriate. EEG data were recorded continuously and digitally stored.  Video monitoring was available and reviewed as appropriate.  Description: The posterior dominant rhythm consists of 8 Hz activity of moderate voltage (25-35 uV) seen predominantly in posterior head regions, symmetric and reactive to eye opening and eye closing. EEG showed intermittent generalized 3 to 6 Hz theta-delta slowing. Hyperventilation and photic stimulation were not performed.      ABNORMALITY -Intermittent slow, generalized   IMPRESSION: This study is suggestive of mild  diffuse encephalopathy, nonspecific etiology. No seizures or definite epileptiform discharges were seen throughout the recording.   Jyren Cerasoli [name]

## 2023-08-19 NOTE — ED Provider Notes (Signed)
 [facility] EMERGENCY DEPARTMENT AT [facility] Provider Note   CSN: [id number] Arrival date & time: 08/19/23  4098     History  Chief Complaint  Patient presents with   Seizures    Cindy [name] is a 63 y.o. female.  Patient has a history of seizures.  She had a seizure today but she has not had a seizure since July.  Her sister gave her some Valium.  The history is provided by a relative and medical records. No language interpreter was used.  Seizures Seizure activity on arrival: no   Seizure type:  Grand mal Preceding symptoms: no dizziness   Initial focality:  None Episode characteristics: no abnormal movements   Postictal symptoms: confusion   Return to baseline: no        Home Medications Prior to Admission medications   Medication Sig Start Date End Date Taking? Authorizing Provider  acetaminophen (TYLENOL) 325 MG tablet Take 2 tablets (650 mg total) by mouth every 6 (six) hours as needed (pain). 09/21/20   Angiulli, Mcarthur Rossetti, PA-C  aspirin 325 MG tablet Take 1 tablet (325 mg total) by mouth daily. Patient not taking: Reported on 11/19/2022 09/21/20   Charlton Amor, PA-C  atorvastatin (LIPITOR) 40 MG tablet Take 1 tablet (40 mg total) by mouth at bedtime. 10/26/20   Angiulli, Mcarthur Rossetti, PA-C  diazePAM, 15 MG Dose, (VALTOCO 15 MG DOSE) 2 x 7.5 MG/0.1ML LQPK Place 7.5 mg into the nose once as needed for up to 1 dose (valtoco ( 7.5 mg in each nostril) for seizure lasting more than 2 minutes.). 11/22/22   Ghimire, Werner Lean, MD  divalproex (DEPAKOTE) 500 MG DR tablet Take 1 tablet (500 mg total) by mouth 2 (two) times daily. 11/23/22   Danford, Earl Lites, MD  folic acid (FOLVITE) 1 MG tablet Take 1 tablet (1 mg total) by mouth daily. 10/26/20   Angiulli, Mcarthur Rossetti, PA-C  lacosamide (VIMPAT) 200 MG TABS tablet Take 1 tablet (200 mg total) by mouth 2 (two) times daily. 11/23/22   Danford, Earl Lites, MD  lisinopril (ZESTRIL) 5 MG tablet Take 2.5 mg by mouth daily.  11/06/22   [provider]  melatonin 3 MG TABS tablet Take 1 tablet (3 mg total) by mouth at bedtime. 10/26/20   Angiulli, Mcarthur Rossetti, PA-C  Multiple Vitamin (MULTIVITAMIN WITH MINERALS) TABS tablet Take 1 tablet by mouth daily. Patient not taking: Reported on 11/19/2022 02/18/20   Cleora Fleet, MD  OLANZapine (ZYPREXA) 2.5 MG tablet take 1 tablet by mouth daily and  take 2 tablets at bedtime Patient not taking: Reported on 11/19/2022 10/26/20   Angiulli, Mcarthur Rossetti, PA-C  pantoprazole (PROTONIX) 40 MG tablet Take 1 tablet (40 mg total) by mouth daily. 10/26/20   Angiulli, Mcarthur Rossetti, PA-C      Allergies    Patient has no known allergies.    Review of Systems   Review of Systems  Constitutional:  Negative for appetite change and fatigue.  HENT:  Negative for congestion, ear discharge and sinus pressure.   Eyes:  Negative for discharge.  Respiratory:  Negative for cough.   Cardiovascular:  Negative for chest pain.  Gastrointestinal:  Negative for abdominal pain and diarrhea.  Genitourinary:  Negative for frequency and hematuria.  Musculoskeletal:  Negative for back pain.  Skin:  Negative for rash.  Neurological:  Positive for seizures. Negative for headaches.  Psychiatric/Behavioral:  Negative for hallucinations.     Physical Exam Updated Vital  Signs BP 111/64   Pulse 92   Temp 97.8 F (36.6 C) (Axillary)   Resp 20   Ht 5\' 7"  (1.702 m)   Wt 71.7 kg   SpO2 93%   BMI 24.76 kg/m  Physical Exam Vitals and nursing note reviewed.  Constitutional:      Appearance: She is well-developed.  HENT:     Head: Normocephalic.     Nose: Nose normal.  Eyes:     General: No scleral icterus.    Conjunctiva/sclera: Conjunctivae normal.  Neck:     Thyroid: No thyromegaly.  Cardiovascular:     Rate and Rhythm: Normal rate and regular rhythm.     Heart sounds: No murmur heard.    No friction rub. No gallop.  Pulmonary:     Breath sounds: No stridor. No wheezing or rales.  Chest:      Chest wall: No tenderness.  Abdominal:     General: There is no distension.     Tenderness: There is no abdominal tenderness. There is no rebound.  Musculoskeletal:        General: Normal range of motion.     Cervical back: Neck supple.  Lymphadenopathy:     Cervical: No cervical adenopathy.  Skin:    Findings: No erythema or rash.  Neurological:     Mental Status: She is alert and oriented to person, place, and time.     Motor: No abnormal muscle tone.     Coordination: Coordination normal.  Psychiatric:        Behavior: Behavior normal.     ED Results / Procedures / Treatments   Labs (all labs ordered are listed, but only abnormal results are displayed) Labs Reviewed  CBC WITH DIFFERENTIAL/PLATELET - Abnormal; Notable for the following components:      Result Value   RBC 3.74 (*)    HCT 34.4 (*)    All other components within normal limits  COMPREHENSIVE METABOLIC PANEL WITH GFR - Abnormal; Notable for the following components:   Sodium 119 (*)    Potassium 5.2 (*)    Chloride 87 (*)    Glucose, Bld 108 (*)    Creatinine, Ser 1.02 (*)    Albumin 3.2 (*)    All other components within normal limits  VALPROIC ACID LEVEL    EKG None  Radiology CT Head Wo Contrast Result Date: 08/19/2023 CLINICAL DATA:  63 year old female with seizure this morning. EXAM: CT HEAD WITHOUT CONTRAST TECHNIQUE: Contiguous axial images were obtained from the base of the skull through the vertex without intravenous contrast. RADIATION DOSE REDUCTION: This exam was performed according to the departmental dose-optimization program which includes automated exposure control, adjustment of the mA and/or kV according to patient size and/or use of iterative reconstruction technique. COMPARISON:  Brain MRI 11/19/2022.  Head CT 11/19/2022. FINDINGS: Brain: Stable cerebral volume. No midline shift, ventriculomegaly, mass effect, evidence of mass lesion, intracranial hemorrhage or evidence of cortically  based acute infarction. Chronic cerebral white matter hypodensity, deep white matter capsule involvement. Chronic right thalamic lacunar infarct. Stable gray-white matter differentiation throughout the brain. Vascular: Calcified atherosclerosis at the skull base. No suspicious intracranial vascular hyperdensity. Skull: Stable, intact. Sinuses/Orbits: Visualized paranasal sinuses and mastoids are stable and well aerated. Other: Rightward gaze. No orbit or scalp soft tissue injury identified. IMPRESSION: 1. No acute intracranial abnormality or acute traumatic injury identified. 2. Stable non contrast CT appearance of chronic small vessel disease. Electronically Signed   By: Althea Grimmer.D.  On: 08/19/2023 09:25    Procedures Procedures    Medications Ordered in ED Medications  valproate (DEPACON) 500 mg in dextrose 5 % 50 mL IVPB (has no administration in time range)  lacosamide (VIMPAT) 200 mg in sodium chloride 0.9 % 25 mL IVPB (0 mg Intravenous Stopped 08/19/23 0954)  sodium chloride 0.9 % bolus 1,000 mL (1,000 mLs Intravenous New Bag/Given 08/19/23 0931)  lamoTRIgine (LAMICTAL) tablet 25 mg (25 mg Oral Given 08/19/23 1024)    ED Course/ Medical Decision Making/ A&P CRITICAL CARE Performed by: Bethann Berkshire Total critical care time: 45 minutes Critical care time was exclusive of separately billable procedures and treating other patients. Critical care was necessary to treat or prevent imminent or life-threatening deterioration. Critical care was time spent personally by me on the following activities: development of treatment plan with patient and/or surrogate as well as nursing, discussions with consultants, evaluation of patient's response to treatment, examination of patient, obtaining history from patient or surrogate, ordering and performing treatments and interventions, ordering and review of laboratory studies, ordering and review of radiographic studies, pulse oximetry and re-evaluation of  patient's condition.  Click here for ABCD2, HEART and other calculatorsREFRESH Note before signing :1}                              Medical Decision Making Amount and/or Complexity of Data Reviewed Labs: ordered. Radiology: ordered.  Risk Prescription drug management. Decision regarding hospitalization.   Patient with hyponatremia and seizures.  She will be admitted to medicine        Final Clinical Impression(s) / ED Diagnoses Final diagnoses:  Hyponatremia  Seizure [facility])    Rx / DC Orders ED Discharge Orders     None         Bethann Berkshire, MD 08/23/23 1202

## 2023-08-19 NOTE — ED Triage Notes (Addendum)
 Pt brought in by [facility] from home with c/o seizure this morning. Family reported to EMS that she had a seizure from 0620-0635 this morning. Pt was given Valtoco IM injection from her sister when she realized she was seizing. Sister reports pt is normally alert, "giddy", and can feed herself. Pt is alert, nonverbal at this time. She does look around. Urine incontinence noted.   EMS vitals:  Temp 97.4 axillary HR 90 BP 150/86 O2 sat 93% RA - placed on 2L with O2 sat now 98%

## 2023-08-19 NOTE — ED Notes (Incomplete)
 Pt has

## 2023-08-19 NOTE — Progress Notes (Signed)
 EEG complete - results pending

## 2023-08-19 NOTE — H&P (Signed)
 History and Physical    Cindy [name] VHQ:[id number] DOB: [birth year] DOA: 08/19/2023  PCP: Shelby Dubin, FNP   Patient coming from: Home  Chief Complaint: Seizure  HPI: Cindy [name] is a 63 y.o. female with medical history significant for seizures, strokes, CKD stage IIIa, hypertension, and deconditioning/dysarthria who presented to the ED with noted seizure at home at approximately 6:30 AM.  According to sister at bedside who was the witness, seizure lasted approximately 15 minutes and she was given Valtoco injection after sister figured out what was happening.  She was noted to have some urinary incontinence.  No trauma, fevers, chills, or recent infections or illness noted prior to this event.  Sister is adamant that patient is compliant with her home seizure medications and has not missed any dosages.  She denies any change to appetite or increase in fluid consumption.  No nausea, vomiting, or diarrhea noted.   ED Course: Vital signs stable and patient afebrile.  Sodium noted to be 119 and potassium 5.2 with creatinine 1.02.  Patient was given IV fluid bolus with normal saline and CT head noted to be negative for any acute abnormalities.  She was also given some Lamictal and Depakote in the ED.  She is somnolent, but responsive to questioning when awakened.  Review of Systems: Reviewed as noted above, otherwise negative.  Past Medical History:  Diagnosis Date   Abnormal LFTs    Hx of   CVA (cerebral vascular accident) (HCC) 2005   Hyperlipidemia    Hypertension    Polysubstance dependence including opioid type drug, episodic abuse (HCC)    cocaine/etoh    Seizures (HCC)     Past Surgical History:  Procedure Laterality Date   ESOPHAGOGASTRODUODENOSCOPY N/A 08/16/2019   Procedure: ESOPHAGOGASTRODUODENOSCOPY (EGD);  Surgeon: Vida Rigger, MD;  Location: [facility] ENDOSCOPY;  Service: Endoscopy;  Laterality: N/A;     reports that she has never smoked. She has never used smokeless tobacco.  She reports current alcohol use. She reports that she does not currently use drugs after having used the following drugs: Cocaine.  No Known Allergies  Family History  Problem Relation Age of Onset   Cancer Father        prostate    Hypertension Mother    Asthma Mother    Bronchitis Mother     Prior to Admission medications   Medication Sig Start Date End Date Taking? Authorizing Provider  acetaminophen (TYLENOL) 325 MG tablet Take 2 tablets (650 mg total) by mouth every 6 (six) hours as needed (pain). 09/21/20   Angiulli, Mcarthur Rossetti, PA-C  aspirin 325 MG tablet Take 1 tablet (325 mg total) by mouth daily. Patient not taking: Reported on 11/19/2022 09/21/20   Charlton Amor, PA-C  atorvastatin (LIPITOR) 40 MG tablet Take 1 tablet (40 mg total) by mouth at bedtime. 10/26/20   Angiulli, Mcarthur Rossetti, PA-C  diazePAM, 15 MG Dose, (VALTOCO 15 MG DOSE) 2 x 7.5 MG/0.1ML LQPK Place 7.5 mg into the nose once as needed for up to 1 dose (valtoco ( 7.5 mg in each nostril) for seizure lasting more than 2 minutes.). 11/22/22   Ghimire, Werner Lean, MD  divalproex (DEPAKOTE) 500 MG DR tablet Take 1 tablet (500 mg total) by mouth 2 (two) times daily. 11/23/22   Danford, Earl Lites, MD  folic acid (FOLVITE) 1 MG tablet Take 1 tablet (1 mg total) by mouth daily. 10/26/20   Angiulli, Mcarthur Rossetti, PA-C  lacosamide (VIMPAT) 200 MG TABS tablet  Take 1 tablet (200 mg total) by mouth 2 (two) times daily. 11/23/22   Danford, Earl Lites, MD  lisinopril (ZESTRIL) 5 MG tablet Take 2.5 mg by mouth daily. 11/06/22   [provider]  melatonin 3 MG TABS tablet Take 1 tablet (3 mg total) by mouth at bedtime. 10/26/20   Angiulli, Mcarthur Rossetti, PA-C  Multiple Vitamin (MULTIVITAMIN WITH MINERALS) TABS tablet Take 1 tablet by mouth daily. Patient not taking: Reported on 11/19/2022 02/18/20   Cleora Fleet, MD  OLANZapine (ZYPREXA) 2.5 MG tablet take 1 tablet by mouth daily and  take 2 tablets at bedtime Patient not taking: Reported  on 11/19/2022 10/26/20   Angiulli, Mcarthur Rossetti, PA-C  pantoprazole (PROTONIX) 40 MG tablet Take 1 tablet (40 mg total) by mouth daily. 10/26/20   Charlton Amor, PA-C    Physical Exam: Vitals:   08/19/23 0930 08/19/23 0945 08/19/23 1015 08/19/23 1139  BP: (!) 129/94 (!) 120/94 111/64   Pulse: 93 95 92   Resp: (!) 28 (!) 22 20   Temp:      TempSrc:      SpO2: 92% 93%    Weight:    76.1 kg  Height:    5\' 7"  (1.702 m)    Constitutional: NAD, calm, comfortable Vitals:   08/19/23 0930 08/19/23 0945 08/19/23 1015 08/19/23 1139  BP: (!) 129/94 (!) 120/94 111/64   Pulse: 93 95 92   Resp: (!) 28 (!) 22 20   Temp:      TempSrc:      SpO2: 92% 93%    Weight:    76.1 kg  Height:    5\' 7"  (1.702 m)   Eyes: lids and conjunctivae normal Neck: normal, supple Respiratory: clear to auscultation bilaterally. Normal respiratory effort. No accessory muscle use.  Cardiovascular: Regular rate and rhythm, no murmurs. Abdomen: no tenderness, no distention. Bowel sounds positive.  Musculoskeletal:  No edema. Skin: no rashes, lesions, ulcers.  Psychiatric: Flat affect  Labs on Admission: I have personally reviewed following labs and imaging studies  CBC: Recent Labs  Lab 08/19/23 0855  WBC 9.6  NEUTROABS 7.7  HGB 12.0  HCT 34.4*  MCV 92.0  PLT 244   Basic Metabolic Panel: Recent Labs  Lab 08/19/23 0855  NA 119*  K 5.2*  CL 87*  CO2 22  GLUCOSE 108*  BUN 17  CREATININE 1.02*  CALCIUM 9.1   GFR: Estimated Creatinine Clearance: 59.3 mL/min (A) (by C-G formula based on SCr of 1.02 mg/dL (H)). Liver Function Tests: Recent Labs  Lab 08/19/23 0855  AST 21  ALT 17  ALKPHOS 90  BILITOT 0.4  PROT 7.3  ALBUMIN 3.2*   No results for input(s): "LIPASE", "AMYLASE" in the last 168 hours. No results for input(s): "AMMONIA" in the last 168 hours. Coagulation Profile: No results for input(s): "INR", "PROTIME" in the last 168 hours. Cardiac Enzymes: No results for input(s):  "CKTOTAL", "CKMB", "CKMBINDEX", "TROPONINI" in the last 168 hours. BNP (last 3 results) No results for input(s): "PROBNP" in the last 8760 hours. HbA1C: No results for input(s): "HGBA1C" in the last 72 hours. CBG: No results for input(s): "GLUCAP" in the last 168 hours. Lipid Profile: No results for input(s): "CHOL", "HDL", "LDLCALC", "TRIG", "CHOLHDL", "LDLDIRECT" in the last 72 hours. Thyroid Function Tests: No results for input(s): "TSH", "T4TOTAL", "FREET4", "T3FREE", "THYROIDAB" in the last 72 hours. Anemia Panel: No results for input(s): "VITAMINB12", "FOLATE", "FERRITIN", "TIBC", "IRON", "RETICCTPCT" in the last 72 hours. Urine  analysis:    Component Value Date/Time   COLORURINE YELLOW 11/20/2022 0645   APPEARANCEUR CLOUDY (A) 11/20/2022 0645   LABSPEC 1.033 (H) 11/20/2022 0645   PHURINE 7.0 11/20/2022 0645   GLUCOSEU NEGATIVE 11/20/2022 0645   HGBUR MODERATE (A) 11/20/2022 0645   BILIRUBINUR NEGATIVE 11/20/2022 0645   KETONESUR NEGATIVE 11/20/2022 0645   PROTEINUR NEGATIVE 11/20/2022 0645   NITRITE NEGATIVE 11/20/2022 0645   LEUKOCYTESUR SMALL (A) 11/20/2022 0645    Radiological Exams on Admission: CT Head Wo Contrast Result Date: 08/19/2023 CLINICAL DATA:  63 year old female with seizure this morning. EXAM: CT HEAD WITHOUT CONTRAST TECHNIQUE: Contiguous axial images were obtained from the base of the skull through the vertex without intravenous contrast. RADIATION DOSE REDUCTION: This exam was performed according to the departmental dose-optimization program which includes automated exposure control, adjustment of the mA and/or kV according to patient size and/or use of iterative reconstruction technique. COMPARISON:  Brain MRI 11/19/2022.  Head CT 11/19/2022. FINDINGS: Brain: Stable cerebral volume. No midline shift, ventriculomegaly, mass effect, evidence of mass lesion, intracranial hemorrhage or evidence of cortically based acute infarction. Chronic cerebral white matter  hypodensity, deep white matter capsule involvement. Chronic right thalamic lacunar infarct. Stable gray-white matter differentiation throughout the brain. Vascular: Calcified atherosclerosis at the skull base. No suspicious intracranial vascular hyperdensity. Skull: Stable, intact. Sinuses/Orbits: Visualized paranasal sinuses and mastoids are stable and well aerated. Other: Rightward gaze. No orbit or scalp soft tissue injury identified. IMPRESSION: 1. No acute intracranial abnormality or acute traumatic injury identified. 2. Stable non contrast CT appearance of chronic small vessel disease. Electronically Signed   By: Odessa Fleming M.D.   On: 08/19/2023 09:25    EKG: Independently reviewed. ST 100bpm.  Assessment/Plan Principal Problem:   Seizure (HCC)    Breakthrough seizure likely secondary to severe hyponatremia -Has history of prior CVAs and seizure, apparently compliant with home medications -Plan to continue home medications -Plan to correct hyponatremia and further investigate cause -Will consider neurology evaluation as needed -Valproic acid levels noted to be therapeutic -Plan to check Depakote levels -CT head with no acute findings -Seizure precautions with Ativan as needed  Severe hyponatremia -Monitor sodium levels after normal saline bolus -Plan to continue normal saline if improving, otherwise will need to consider hypertonic saline -Further workup with TSH, urine and serum osmolarity as well as urine sodium levels ordered  Hyperkalemia -1 dose of Lokelma -Plan to recheck BMP later this afternoon -Continue telemetry monitoring  Hypertension -Currently stable, continue to monitor -Hold home lisinopril until hyperkalemia resolves  CKD stage IIIa -Currently at baseline, continue to monitor  History of prior CVA -Continue aspirin and statin   DVT prophylaxis: Heparin Code Status: DNR Family Communication: Sister at bedside Disposition Plan: Admit for  hyponatremia/seizure evaluation Consults called: None Admission status: Inpatient, SDU  Severity of Illness: The appropriate patient status for this patient is INPATIENT. Inpatient status is judged to be reasonable and necessary in order to provide the required intensity of service to ensure the patient's safety. The patient's presenting symptoms, physical exam findings, and initial radiographic and laboratory data in the context of their chronic comorbidities is felt to place them at high risk for further clinical deterioration. Furthermore, it is not anticipated that the patient will be medically stable for discharge from the hospital within 2 midnights of admission.   * I certify that at the point of admission it is my clinical judgment that the patient will require inpatient hospital care spanning beyond 2 midnights from  the point of admission due to high intensity of service, high risk for further deterioration and high frequency of surveillance required.*   Maisley Hainsworth D Sherisa Gilvin DO [facility]  If 7PM-7AM, please contact night-coverage [email or web address]  08/19/2023, 11:47 AM

## 2023-08-19 NOTE — ED Notes (Signed)
 All of pt's belongings have been placed in pt belonging bags.

## 2023-08-19 NOTE — ED Notes (Signed)
 ED Provider at bedside.

## 2023-08-19 NOTE — ED Notes (Signed)
Phlebotomist at bedside drawing labs

## 2023-08-19 NOTE — ED Notes (Signed)
 Patient transported to CT

## 2023-08-20 DIAGNOSIS — R569 Unspecified convulsions: Secondary | ICD-10-CM | POA: Diagnosis not present

## 2023-08-20 LAB — CBC
HCT: 30.8 % — ABNORMAL LOW (ref 36.0–46.0)
Hemoglobin: 10.4 g/dL — ABNORMAL LOW (ref 12.0–15.0)
MCH: 31.3 pg (ref 26.0–34.0)
MCHC: 33.8 g/dL (ref 30.0–36.0)
MCV: 92.8 fL (ref 80.0–100.0)
Platelets: 231 10*3/uL (ref 150–400)
RBC: 3.32 MIL/uL — ABNORMAL LOW (ref 3.87–5.11)
RDW: 12.7 % (ref 11.5–15.5)
WBC: 7.2 10*3/uL (ref 4.0–10.5)
nRBC: 0 % (ref 0.0–0.2)

## 2023-08-20 LAB — SODIUM
Sodium: 123 mmol/L — ABNORMAL LOW (ref 135–145)
Sodium: 126 mmol/L — ABNORMAL LOW (ref 135–145)
Sodium: 126 mmol/L — ABNORMAL LOW (ref 135–145)
Sodium: 126 mmol/L — ABNORMAL LOW (ref 135–145)

## 2023-08-20 LAB — BASIC METABOLIC PANEL WITH GFR
Anion gap: 7 (ref 5–15)
BUN: 12 mg/dL (ref 8–23)
CO2: 21 mmol/L — ABNORMAL LOW (ref 22–32)
Calcium: 8.7 mg/dL — ABNORMAL LOW (ref 8.9–10.3)
Chloride: 97 mmol/L — ABNORMAL LOW (ref 98–111)
Creatinine, Ser: 0.93 mg/dL (ref 0.44–1.00)
GFR, Estimated: >60 mL/min (ref >60)
Glucose, Bld: 90 mg/dL (ref 70–99)
Potassium: 4 mmol/L (ref 3.5–5.1)
Sodium: 125 mmol/L — ABNORMAL LOW (ref 135–145)

## 2023-08-20 LAB — HIV ANTIBODY (ROUTINE TESTING W REFLEX): HIV Screen 4th Generation wRfx: NONREACTIVE

## 2023-08-20 LAB — T4, FREE: Free T4: 0.95 ng/dL (ref 0.61–1.12)

## 2023-08-20 LAB — MAGNESIUM: Magnesium: 1.5 mg/dL — ABNORMAL LOW (ref 1.7–2.4)

## 2023-08-20 MED ORDER — MAGNESIUM SULFATE 2 GM/50ML IV SOLN
2.0000 g | Freq: Once | INTRAVENOUS | Status: AC
  Administered 2023-08-20: 2 g via INTRAVENOUS
  Filled 2023-08-20: qty 50

## 2023-08-20 MED ORDER — SODIUM CHLORIDE 1 G PO TABS
1.0000 g | ORAL_TABLET | Freq: Two times a day (BID) | ORAL | Status: DC
Start: 2023-08-20 — End: 2023-08-21
  Administered 2023-08-20 – 2023-08-21 (×2): 1 g via ORAL
  Filled 2023-08-20 (×2): qty 1

## 2023-08-20 MED ORDER — ORAL CARE MOUTH RINSE: 15.0000 mL | OROMUCOSAL | Status: DC | PRN

## 2023-08-20 NOTE — Plan of Care (Signed)
  Problem: Education: Goal: Knowledge of General Education information will improve Description: Including pain rating scale, medication(s)/side effects and non-pharmacologic comfort measures Outcome: Progressing   Problem: Coping: Goal: Level of anxiety will decrease Outcome: Progressing   Problem: Elimination: Goal: Will not experience complications related to urinary retention Outcome: Progressing   

## 2023-08-20 NOTE — TOC Initial Note (Addendum)
 Transition of Care [facility]) - Initial/Assessment Note    Patient Details  Name: Cindy [name] MRN: [id number] Date of Birth: 08-Nov-1960  Transition of Care [facility]) CM/SW Contact:    Villa Herb, LCSWA Phone Number: 08/20/2023, 11:29 AM  Clinical Narrative:                 Pt is high risk for readmission. CSW left voicemail for pts sister requesting call back. CSW completed chart review and notes that pt is from home with sister who is her CAPs caregiver. Sister is in home with pt 24/7 per prior notes. Pt has hospital bed, hoyer lift and other needed DME. CSW notes that PT is recommending no follow up at D/C as pt is at her baseline. TOC to follow.   CSW received call back from pts sister, she confirmed all of the above information.  Expected Discharge Plan: Home/Self Care Barriers to Discharge: Continued Medical Work up   Patient Goals and CMS Choice Patient states their goals for this hospitalization and ongoing recovery are:: return home CMS Medicare.gov Compare Post Acute Care list provided to:: Patient Choice offered to / list presented to : Patient      Expected Discharge Plan and Services In-house Referral: Clinical Social Work Discharge Planning Services: CM Consult   Living arrangements for the past 2 months: Single Family Home                                      Prior Living Arrangements/Services Living arrangements for the past 2 months: Single Family Home Lives with:: Self, Siblings Patient language and need for interpreter reviewed:: Yes Do you feel safe going back to the place where you live?: Yes      Need for Family Participation in Patient Care: Yes (Comment) Care giver support system in place?: Yes (comment) Current home services: DME Criminal Activity/Legal Involvement Pertinent to Current Situation/Hospitalization: No - Comment as needed  Activities of Daily Living   ADL Screening (condition at time of admission) Independently performs ADLs?:  No Does the patient have a NEW difficulty with bathing/dressing/toileting/self-feeding that is expected to last >3 days?: Yes (Initiates electronic notice to provider for possible OT consult) Does the patient have a NEW difficulty with getting in/out of bed, walking, or climbing stairs that is expected to last >3 days?: Yes (Initiates electronic notice to provider for possible PT consult) Does the patient have a NEW difficulty with communication that is expected to last >3 days?: Yes (Initiates electronic notice to provider for possible SLP consult) Is the patient deaf or have difficulty hearing?: No Does the patient have difficulty seeing, even when wearing glasses/contacts?: No Does the patient have difficulty concentrating, remembering, or making decisions?: Yes  Permission Sought/Granted                  Emotional Assessment         Alcohol / Substance Use: Not Applicable Psych Involvement: No (comment)  Admission diagnosis:  Hyponatremia [E87.1] Seizure [facility]) [R56.9] Patient Active Problem List   Diagnosis Date Noted   Seizure (HCC) 11/19/2022   Dysphagia    Agitation    Malnutrition of moderate degree 08/24/2020   Acute metabolic encephalopathy 08/23/2020   Polysubstance abuse (HCC) 08/23/2020   AKI (acute kidney injury) (HCC) 08/23/2020   History of stroke 08/23/2020   Seizures (HCC) 02/09/2020   Hyponatremia 02/09/2020   SIRS (systemic inflammatory  response syndrome) (HCC) 02/09/2020   Cocaine abuse (HCC) 02/09/2020   Ataxia due to recent stroke 08/19/2019   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   Cerebral embolism with cerebral infarction 08/13/2019   Status epilepticus (HCC) 08/05/2019   Acute encephalopathy 08/05/2019   Elevated AST (SGOT) 10/23/2009   HYPERCHOLESTEROLEMIA 07/03/2008   Alcohol abuse 07/03/2008   Essential hypertension 07/03/2008   Cerebral artery occlusion with cerebral infarction (HCC) 07/03/2008   Dysphagia 07/03/2008   Glucose  intolerance 07/03/2008   Pain in joint, shoulder region 06/22/2008   Spondylosis 06/22/2008   CERVICALGIA 06/22/2008   SPINAL STENOSIS 06/22/2008   CERVICAL SPASM 06/22/2008   PCP:  Shelby Dubin, FNP Pharmacy:   [facility] - [place], [place] - [address] [place] [address] Phone: [phone] Fax: [phone]  [facility] [address] [place] [address] Phone: [phone] Fax: [phone]     Social Drivers of Health (SDOH) Social History: SDOH Screenings   Food Insecurity: No Food Insecurity (08/19/2023)  Housing: Low Risk  (08/19/2023)  Transportation Needs: No Transportation Needs (08/19/2023)  Utilities: Not At Risk (08/19/2023)  Social Connections: Patient Unable To Answer (08/19/2023)  Tobacco Use: Low Risk  (08/19/2023)   SDOH Interventions:     Readmission Risk Interventions    08/20/2023   11:28 AM  Readmission Risk Prevention Plan  Transportation Screening Complete  HRI or Home Care Consult Complete  Social Work Consult for Recovery Care Planning/Counseling Complete  Palliative Care Screening Not Applicable  Medication Review Oceanographer) Complete

## 2023-08-20 NOTE — Evaluation (Signed)
 Occupational Therapy Evaluation Patient Details Name: Cindy [name] MRN: [id number] DOB: February 20, 1961 Today's Date: 08/20/2023   History of Present Illness   Cindy [name] is a 63 y.o. female with medical history significant for seizures, strokes, CKD stage IIIa, hypertension, and deconditioning/dysarthria who presented to the ED with noted seizure at home at approximately 6:30 AM.  According to sister at bedside who was the witness, seizure lasted approximately 15 minutes and she was given Valtoco injection after sister figured out what was happening.  She was noted to have some urinary incontinence.  No trauma, fevers, chills, or recent infections or illness noted prior to this event.  Sister is adamant that patient is compliant with her home seizure medications and has not missed any dosages.  She denies any change to appetite or increase in fluid consumption.  No nausea, vomiting, or diarrhea noted. (per DO)     Clinical Impressions Pt agreeable to OT evaluation. Sister present and providing history. Pt is assisted much for ADL's and transfers at baseline. Functionally pt seems to be near baseline. Pt  RUE weak from previous stroke and L UE generally weak as well. Mod to max A for transfer to chair without AD. Mod to max A for bed mobility. Family reports ability to care for pt in her current condition. Primary deficit area appears to be cognition which is impaired form baseline. Pt left in the chair with family present, call bell within reach, and chair alarm set. Pt is not recommended for further acute OT services and will be discharged to care of nursing staff for remaining length of stay.      If plan is discharge home, recommend the following:   A lot of help with walking and/or transfers;A lot of help with bathing/dressing/bathroom;Assistance with cooking/housework;Direct supervision/assist for medications management;Assist for transportation;Help with stairs or ramp for entrance;Supervision  due to cognitive status     Functional Status Assessment   Patient has not had a recent decline in their functional status (Pt near baseline for ADL function.)     Equipment Recommendations   None recommended by OT             Precautions/Restrictions   Precautions Precautions: Fall Recall of Precautions/Restrictions: Impaired Restrictions Weight Bearing Restrictions Per Provider Order: No     Mobility Bed Mobility Overal bed mobility: Needs Assistance Bed Mobility: Supine to Sit     Supine to sit: HOB elevated, Mod assist, Max assist     General bed mobility comments: labored movement; assist to move to EOB    Transfers Overall transfer level: Needs assistance   Transfers: Sit to/from Stand, Bed to chair/wheelchair/BSC Sit to Stand: Mod assist Stand pivot transfers: Max assist         General transfer comment: slow labored movement; compelted SPT to chair without AD as sister does at home.      Balance Overall balance assessment: Needs assistance Sitting-balance support: No upper extremity supported, Feet supported Sitting balance-Leahy Scale: Poor Sitting balance - Comments: poor to fair seated at EOB   Standing balance support: During functional activity, Bilateral upper extremity supported (holding therapist) Standing balance-Leahy Scale: Poor Standing balance comment: without AD                           ADL either performed or assessed with clinical judgement   ADL Overall ADL's : At baseline  General ADL Comments: Pt is assisted for most ADL's at baseline.     Vision Baseline Vision/History: 1 Wears glasses Ability to See in Adequate Light: 1 Impaired Patient Visual Report: No change from baseline Vision Assessment?: No apparent visual deficits     Perception Perception: Not tested       Praxis Praxis: Impaired   Praxis-Other Comments: Pt noted to have some  tremors during intentional movement.   Pertinent Vitals/Pain Pain Assessment Pain Assessment: No/denies pain     Extremity/Trunk Assessment Upper Extremity Assessment Upper Extremity Assessment: RUE deficits/detail;LUE deficits/detail RUE Deficits / Details: Limited by previous stroke. 3-/5 shoulder; generally weak otherwise. LUE Deficits / Details: generally weak   Lower Extremity Assessment Lower Extremity Assessment: Defer to PT evaluation   Cervical / Trunk Assessment Cervical / Trunk Assessment: Kyphotic   Communication Communication Communication: No apparent difficulties   Cognition Arousal: Alert Behavior During Therapy: WFL for tasks assessed/performed Cognition: Cognition impaired             OT - Cognition Comments: Pt is not her baseline. Able to follow commands but does not recognize family.                 Following commands: Intact       Cueing  General Comments   Cueing Techniques: Verbal cues;Tactile cues                 Home Living Family/patient expects to be discharged to:: Private residence Living Arrangements: Other relatives (sister) Available Help at Discharge: Family;Available 24 hours/day Type of Home: House Home Access: Ramped entrance     Home Layout: Able to live on main level with bedroom/bathroom     Bathroom Shower/Tub: Sponge bathes at baseline   Bathroom Toilet:  (uses bed pan)     Home Equipment: Rolling Walker (2 wheels);Wheelchair - manual;BSC/3in1;Hospital bed;Other (comment) (hoyer lift)   Additional Comments: lives with sister and brother in law, as well as nieces. sister is primary caregiver, and confirmed information today.      Prior Functioning/Environment Prior Level of Function : Needs assist       Physical Assist : ADLs (physical);Mobility (physical) Mobility (physical): Bed mobility;Transfers;Gait;Stairs ADLs (physical): Grooming;Bathing;Dressing;Toileting;IADLs Mobility Comments: pt  performs stand pivot transfers without RW and 1 person assist, has w/c she utilizes for mobility ADLs Comments: Able to feed self; assist for all other ADL's and IADL's     End of Session Equipment Utilized During Treatment: Gait belt Nurse Communication: Mobility status  Activity Tolerance: Patient tolerated treatment well Patient left: in chair;with call bell/phone within reach;with chair alarm set;with family/visitor present  OT Visit Diagnosis: Unsteadiness on feet (R26.81);Other abnormalities of gait and mobility (R26.89);Other symptoms and signs involving cognitive function                Time: 1610-9604 OT Time Calculation (min): 22 min Charges:  OT General Charges $OT Visit: 1 Visit OT Evaluation $OT Eval Low Complexity: 1 Low  Tregan Read OT, MOT  Danie Chandler 08/20/2023, 9:43 AM

## 2023-08-20 NOTE — Evaluation (Signed)
 Physical Therapy Evaluation Patient Details Name: Cindy [name] MRN: [id number] DOB: January 22, 1961 Today's Date: 08/20/2023  History of Present Illness  Cindy [name] is a 63 y.o. female with medical history significant for seizures, strokes, CKD stage IIIa, hypertension, and deconditioning/dysarthria who presented to the ED with noted seizure at home at approximately 6:30 AM.  According to sister at bedside who was the witness, seizure lasted approximately 15 minutes and she was given Valtoco injection after sister figured out what was happening.  She was noted to have some urinary incontinence.  No trauma, fevers, chills, or recent infections or illness noted prior to this event.  Sister is adamant that patient is compliant with her home seizure medications and has not missed any dosages.  She denies any change to appetite or increase in fluid consumption.  No nausea, vomiting, or diarrhea noted.  Clinical Impression  PT has not been ambulatory for four years.  PT lives with her sister and depends upon her for transfers. PT needs mod assist with transfers which is at previous functioning levelyes        If plan is discharge home, recommend the following: A lot of help with walking and/or transfers;A lot of help with bathing/dressing/bathroom;Assistance with cooking/housework;Assistance with feeding   Can travel by private vehicle    yes    Equipment Recommendations None recommended by PT  Recommendations for Other Services    no   Functional Status Assessment Patient has not had a recent decline in their functional status     Precautions / Restrictions Precautions Precautions: Fall Recall of Precautions/Restrictions: Impaired Restrictions Weight Bearing Restrictions Per Provider Order: No      Mobility  Bed Mobility Overal bed mobility: Needs Assistance Bed Mobility: Supine to Sit     Supine to sit: HOB elevated, Mod assist, Max assist     General bed mobility comments: labored  movement; assist to move to EOB    Transfers Overall transfer level: Needs assistance   Transfers: Sit to/from Stand, Bed to chair/wheelchair/BSC Sit to Stand: Mod assist Stand pivot transfers: Mod assist         General transfer comment: slow labored movement; compelted SPT to chair without AD as sister does at home.          Pertinent Vitals/Pain Pain Assessment Facial Expression: Relaxed, neutral    Home Living Family/patient expects to be discharged to:: Private residence Living Arrangements: Other relatives (sister) Available Help at Discharge: Family;Available 24 hours/day Type of Home: House Home Access: Ramped entrance       Home Layout: Able to live on main level with bedroom/bathroom Home Equipment: Rolling Walker (2 wheels);Wheelchair - manual;BSC/3in1;Hospital bed;Other (comment) (hoyer lift) Additional Comments: lives with sister and brother in law, as well as nieces. sister is primary caregiver, and confirmed information today.    Prior Function Prior Level of Function : Needs assist       Physical Assist : ADLs (physical);Mobility (physical) Mobility (physical): Bed mobility;Transfers;Gait;Stairs ADLs (physical): Grooming;Bathing;Dressing;Toileting;IADLs Mobility Comments: pt performs stand pivot transfers without RW and 1 person assist, has w/c she utilizes for mobility; pt has not walked for four years ADLs Comments: Able to feed self; assist for all other ADL's and IADL's     Extremity/Trunk Assessment   Upper Extremity Assessment Upper Extremity Assessment: RUE deficits/detail;LUE deficits/detail RUE Deficits / Details: Limited by previous stroke. 3-/5 shoulder; generally weak otherwise. LUE Deficits / Details: generally weak    Lower Extremity Assessment Lower Extremity Assessment: Generalized weakness  Cervical / Trunk Assessment Cervical / Trunk Assessment: Kyphotic  Communication   Communication Communication: Impaired     Cognition Arousal: Alert Behavior During Therapy: WFL for tasks assessed/performed   PT - Cognitive impairments: History of cognitive impairments                         Following commands: Intact       Cueing Cueing Techniques: Verbal cues, Tactile cues            Assessment/Plan    PT Assessment Patient does not need any further PT services  PT Problem List                       AM-PAC PT "6 Clicks" Mobility  Outcome Measure Help needed turning from your back to your side while in a flat bed without using bedrails?: A Lot Help needed moving from lying on your back to sitting on the side of a flat bed without using bedrails?: A Lot Help needed moving to and from a bed to a chair (including a wheelchair)?: A Lot Help needed standing up from a chair using your arms (e.g., wheelchair or bedside chair)?: A Lot Help needed to walk in hospital room?: Total Help needed climbing 3-5 steps with a railing? : Total 6 Click Score: 10    End of Session   Activity Tolerance: Patient tolerated treatment well Patient left: in chair;with call bell/phone within reach;with family/visitor present Nurse Communication: Mobility status PT Visit Diagnosis: Unsteadiness on feet (R26.81)    Time: 1100-1115 PT Time Calculation (min) (ACUTE ONLY): 15 min   Charges:   PT Evaluation $PT Eval Low Complexity: 1 Low   PT General Charges $$ ACUTE PT VISIT: 1 Visit         Virgina Organ, PT CLT [phone]  08/20/2023, 11:22 AM

## 2023-08-20 NOTE — Progress Notes (Signed)
 PROGRESS NOTE    DESTRY BEZDEK  AYT:[id number] DOB: 02-01-1961 DOA: 08/19/2023 PCP: Shelby Dubin, FNP   Brief Narrative:    Cindy [name] is a 63 y.o. female with medical history significant for seizures, strokes, CKD stage IIIa, hypertension, and deconditioning/dysarthria who presented to the ED with noted seizure at home at approximately 6:30 AM.  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   Hypertension -Currently stable, continue to monitor -Hold home lisinopril until hyperkalemia resolves   CKD stage IIIa -Currently at baseline, continue to monitor   History of prior CVA -Continue aspirin and statin   DVT prophylaxis: Heparin Code Status: DNR Family Communication: Sister at bedside 4/3 Disposition Plan:  Status is: Inpatient Remains inpatient appropriate because: Need for IV fluid  Consultants:  None  Procedures:  None  Antimicrobials:  None   Subjective: Patient seen and evaluated today with no new acute complaints or concerns. No acute concerns or events noted overnight.  Patient overall appears to be doing better and sodium levels are  increasing.  No seizures noted overnight.  Objective: Vitals:   08/20/23 0500 08/20/23 0600 08/20/23 0800 08/20/23 0900  BP: (!) 97/58 108/64 (!) 140/75 135/81  Pulse: 77 79 84 95  Resp: 16 16 18 16   Temp:    98.2 F (36.8 C)  TempSrc:    Oral  SpO2: 95% 94% 100% 98%  Weight:      Height:        Intake/Output Summary (Last 24 hours) at 08/20/2023 0932 Last data filed at 08/20/2023 0329 Gross per 24 hour  Intake 1563.58 ml  Output 900 ml  Net 663.58 ml   Filed Weights   08/19/23 0819 08/19/23 1139  Weight: 71.7 kg 76.1 kg    Examination:  General exam: Appears calm and comfortable  Respiratory system: Clear to auscultation. Respiratory effort normal. Cardiovascular system: S1 & S2 heard, RRR.  Gastrointestinal system: Abdomen is soft Central nervous system: Alert and awake Extremities: No edema Skin: No significant lesions noted Psychiatry: Flat affect.    Data Reviewed: I have personally reviewed following labs and imaging studies  CBC: Recent Labs  Lab 08/19/23 0855 08/20/23 0425  WBC 9.6 7.2  NEUTROABS 7.7  --   HGB 12.0 10.4*  HCT 34.4* 30.8*  MCV 92.0 92.8  PLT 244 231   Basic Metabolic Panel: Recent Labs  Lab 08/19/23 0855 08/19/23 1219 08/19/23 1652 08/19/23 2014 08/20/23 0027 08/20/23 0425 08/20/23 0828  NA 119*   < > 121* 120* 123* 125* 126*  K 5.2*  --  4.8  --   --  4.0  --   CL 87*  --  89*  --   --  97*  --   CO2  22  --  19*  --   --  21*  --   GLUCOSE 108*  --  92  --   --  90  --   BUN 17  --  14  --   --  12  --   CREATININE 1.02*  --  0.94  --   --  0.93  --   CALCIUM 9.1  --  8.9  --   --  8.7*  --   MG  --   --   --  1.4*  --   --  1.5*   < > = values in this interval not displayed.   GFR: Estimated Creatinine Clearance: 65 mL/min (by C-G formula based on SCr of 0.93 mg/dL). Liver Function Tests: Recent Labs  Lab 08/19/23 0855  AST 21  ALT 17  ALKPHOS 90  BILITOT 0.4  PROT 7.3  ALBUMIN 3.2*   No results for  input(s): "LIPASE", "AMYLASE" in the last 168 hours. No results for input(s): "AMMONIA" in the last 168 hours. Coagulation Profile: No results for input(s): "INR", "PROTIME" in the last 168 hours. Cardiac Enzymes: No results for input(s): "CKTOTAL", "CKMB", "CKMBINDEX", "TROPONINI" in the last 168 hours. BNP (last 3 results) No results for input(s): "PROBNP" in the last 8760 hours. HbA1C: No results for input(s): "HGBA1C" in the last 72 hours. CBG: No results for input(s): "GLUCAP" in the last 168 hours. Lipid Profile: No results for input(s): "CHOL", "HDL", "LDLCALC", "TRIG", "CHOLHDL", "LDLDIRECT" in the last 72 hours. Thyroid Function Tests: Recent Labs    08/19/23 0855  TSH 5.006*   Anemia Panel: No results for input(s): "VITAMINB12", "FOLATE", "FERRITIN", "TIBC", "IRON", "RETICCTPCT" in the last 72 hours. Sepsis Labs: No results for input(s): "PROCALCITON", "LATICACIDVEN" in the last 168 hours.  Recent Results (from the past 240 hours)  MRSA Next Gen by PCR, Nasal     Status: None   Collection Time: 08/19/23 11:31 AM   Specimen: Nasal Mucosa; Nasal Swab  Result Value Ref Range Status   MRSA by PCR Next Gen NOT DETECTED NOT DETECTED Final    Comment: (NOTE) The GeneXpert MRSA Assay (FDA approved for NASAL specimens only), is one component of a comprehensive MRSA colonization surveillance program. It is not intended to diagnose MRSA infection nor to guide or monitor treatment for MRSA infections. Test performance is not FDA approved in patients less than 61 years old. Performed at [facility], [address], [place], [place] [address]          Radiology Studies: EEG adult Result Date: 08/19/2023 Charlsie Quest, MD     08/19/2023  4:42 PM Patient Name: Cindy [name] MRN: [id number] Epilepsy Attending: Charlsie Quest Referring Physician/Provider: Erick Blinks, DO Date: 08/19/2023 Duration: 23.47 mins Patient history: 62 old female with seizure-like episode.  EEG to  evaluate for seizure. Level of alertness: Awake AEDs during EEG study: Lamotrigine, Depakote, Vimpat Technical aspects: This EEG study was done with scalp electrodes positioned according to the 10-20 International system of electrode placement. Electrical activity was reviewed with band pass filter of 1-70Hz , sensitivity of 7 uV/mm, display speed of 54mm/sec with a 60Hz  notched filter applied as appropriate. EEG data were recorded continuously and digitally stored.  Video monitoring was available and reviewed as appropriate. Description: The posterior dominant rhythm consists of 8 Hz activity of moderate voltage (25-35 uV) seen predominantly in posterior head regions, symmetric and reactive to eye opening and eye closing. EEG showed intermittent generalized  3 to 6 Hz theta-delta slowing. Hyperventilation and photic stimulation were not performed.    ABNORMALITY -Intermittent slow, generalized  IMPRESSION: This study is suggestive of mild  diffuse encephalopathy, nonspecific etiology. No seizures or definite epileptiform discharges were seen throughout the recording.  Charlsie Quest   CT Head Wo Contrast Result Date: 08/19/2023 CLINICAL DATA:  63 year old female with seizure this morning. EXAM: CT HEAD WITHOUT CONTRAST TECHNIQUE: Contiguous axial images were obtained from the base of the skull through the vertex without intravenous contrast. RADIATION DOSE REDUCTION: This exam was performed according to the departmental dose-optimization program which includes automated exposure control, adjustment of the mA and/or kV according to patient size and/or use of iterative reconstruction technique. COMPARISON:  Brain MRI 11/19/2022.  Head CT 11/19/2022. FINDINGS: Brain: Stable cerebral volume. No midline shift, ventriculomegaly, mass effect, evidence of mass lesion, intracranial hemorrhage or evidence of cortically based acute infarction. Chronic cerebral white matter hypodensity, deep white matter capsule involvement.  Chronic right thalamic lacunar infarct. Stable gray-white matter differentiation throughout the brain. Vascular: Calcified atherosclerosis at the skull base. No suspicious intracranial vascular hyperdensity. Skull: Stable, intact. Sinuses/Orbits: Visualized paranasal sinuses and mastoids are stable and well aerated. Other: Rightward gaze. No orbit or scalp soft tissue injury identified. IMPRESSION: 1. No acute intracranial abnormality or acute traumatic injury identified. 2. Stable non contrast CT appearance of chronic small vessel disease. Electronically Signed   By: Odessa Fleming M.D.   On: 08/19/2023 09:25        Scheduled Meds:  aspirin EC  81 mg Oral Daily   atorvastatin  40 mg Oral QHS   Chlorhexidine Gluconate Cloth  6 each Topical Q0600   divalproex  500 mg Oral BID   folic acid  1 mg Oral Daily   heparin  5,000 Units Subcutaneous Q8H   lamoTRIgine  75 mg Oral BID   pantoprazole  40 mg Oral Daily   Continuous Infusions:  magnesium sulfate bolus IVPB     sodium chloride (hypertonic) 30 mL/hr at 08/20/23 0325     LOS: 1 day    Time spent: 55 minutes    Harden Bramer D Sherryll Burger, DO [facility]  If 7PM-7AM, please contact night-coverage [email or web address] 08/20/2023, 9:32 AM

## 2023-08-21 DIAGNOSIS — R569 Unspecified convulsions: Secondary | ICD-10-CM | POA: Diagnosis not present

## 2023-08-21 LAB — BASIC METABOLIC PANEL WITH GFR
Anion gap: 8 (ref 5–15)
BUN: 15 mg/dL (ref 8–23)
CO2: 21 mmol/L — ABNORMAL LOW (ref 22–32)
Calcium: 8.8 mg/dL — ABNORMAL LOW (ref 8.9–10.3)
Chloride: 99 mmol/L (ref 98–111)
Creatinine, Ser: 1.14 mg/dL — ABNORMAL HIGH (ref 0.44–1.00)
GFR, Estimated: 54 mL/min — ABNORMAL LOW (ref >60)
Glucose, Bld: 93 mg/dL (ref 70–99)
Potassium: 4.1 mmol/L (ref 3.5–5.1)
Sodium: 128 mmol/L — ABNORMAL LOW (ref 135–145)

## 2023-08-21 LAB — CBC
HCT: 34.7 % — ABNORMAL LOW (ref 36.0–46.0)
Hemoglobin: 11.7 g/dL — ABNORMAL LOW (ref 12.0–15.0)
MCH: 31.7 pg (ref 26.0–34.0)
MCHC: 33.7 g/dL (ref 30.0–36.0)
MCV: 94 fL (ref 80.0–100.0)
Platelets: 263 10*3/uL (ref 150–400)
RBC: 3.69 MIL/uL — ABNORMAL LOW (ref 3.87–5.11)
RDW: 13.2 % (ref 11.5–15.5)
WBC: 8 10*3/uL (ref 4.0–10.5)
nRBC: 0 % (ref 0.0–0.2)

## 2023-08-21 LAB — MAGNESIUM: Magnesium: 1.9 mg/dL (ref 1.7–2.4)

## 2023-08-21 MED ORDER — MELATONIN 3 MG PO TABS
3.0000 mg | ORAL_TABLET | Freq: Every day | ORAL | Status: DC
  Administered 2023-08-21: 3 mg via ORAL
  Filled 2023-08-21: qty 1

## 2023-08-21 MED ORDER — SODIUM CHLORIDE 1 G PO TABS
1.0000 g | ORAL_TABLET | Freq: Three times a day (TID) | ORAL | Status: DC
  Administered 2023-08-21 – 2023-08-22 (×3): 1 g via ORAL
  Filled 2023-08-21 (×3): qty 1

## 2023-08-21 NOTE — Plan of Care (Signed)

## 2023-08-21 NOTE — Progress Notes (Signed)
 PROGRESS NOTE    BYRD TERRERO  WUJ:[id number] DOB: 1960-11-01 DOA: 08/19/2023 PCP: Shelby Dubin, FNP   Brief Narrative:    Cindy [name] is a 63 y.o. female with medical history significant for seizures, strokes, CKD stage IIIa, hypertension, and deconditioning/dysarthria who presented to the ED with noted seizure at home at approximately 6:30 AM.  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  Sodium levels are slowly improving with fluid restriction and sodium chloride tablets.  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-improving -Fluid restriction of 1200 mL -Continue salt tablets -TSH elevated, free t4 within normal limits -Sodium goal of 130 prior to discharge   Hypertension -Currently stable, continue to monitor -Hold home lisinopril until hyperkalemia resolves   CKD stage IIIa -Currently at baseline, continue to monitor   History of prior CVA -Continue aspirin and statin   DVT prophylaxis: Heparin Code Status: DNR Family Communication: Sister at bedside 4/4 Disposition Plan:  Status is: Inpatient Remains inpatient appropriate because: Need for IV fluid  Consultants:  None  Procedures:  None  Antimicrobials:  None   Subjective: Patient seen and evaluated today with no new acute complaints or concerns. No acute concerns or events noted overnight.  Patient overall appears to be doing  better and sodium levels are increasing.  No seizures noted overnight.  Objective: Vitals:   08/20/23 1500 08/20/23 1518 08/20/23 1907 08/21/23 0449  BP:  131/72 (!) 150/90 132/74  Pulse: 88 86 94   Resp: 16 20 20 18   Temp:  98.5 F (36.9 C) 98.7 F (37.1 C) 98.9 F (37.2 C)  TempSrc:  Oral Oral Oral  SpO2: 98% 98% 97% 95%  Weight:      Height:        Intake/Output Summary (Last 24 hours) at 08/21/2023 1026 Last data filed at 08/21/2023 0500 Gross per 24 hour  Intake 488.37 ml  Output 1950 ml  Net -1461.63 ml   Filed Weights   08/19/23 0819 08/19/23 1139  Weight: 71.7 kg 76.1 kg    Examination:  General exam: Appears calm and comfortable  Respiratory system: Clear to auscultation. Respiratory effort normal. Cardiovascular system: S1 & S2 heard, RRR.  Gastrointestinal system: Abdomen is soft Central nervous system: Alert and awake Extremities: No edema Skin: No significant lesions noted Psychiatry: Flat affect.    Data Reviewed: I have personally reviewed following labs and imaging studies  CBC: Recent Labs  Lab 08/19/23 0855 08/20/23 0425 08/21/23 0511  WBC 9.6 7.2 8.0  NEUTROABS 7.7  --   --   HGB 12.0 10.4* 11.7*  HCT 34.4* 30.8* 34.7*  MCV 92.0 92.8 94.0  PLT 244 231 263   Basic Metabolic Panel: Recent Labs  Lab 08/19/23 0855 08/19/23 1219 08/19/23 1652 08/19/23 2014 08/20/23 0027 08/20/23 0425 08/20/23 0828 08/20/23 1142 08/20/23 2019 08/21/23 0511  NA 119*   < > 121* 120*   < > 125* 126* 126* 126* 128*  K 5.2*  --  4.8  --   --  4.0  --   --   --  4.1  CL 87*  --  89*  --   --  97*  --   --   --  99  CO2 22  --  19*  --   --  21*  --   --   --  21*  GLUCOSE 108*  --  92  --   --  90  --   --   --  93  BUN 17  --  14  --   --  12  --   --   --  15  CREATININE 1.02*  --  0.94  --   --  0.93  --   --   --  1.14*  CALCIUM 9.1  --  8.9  --   --  8.7*  --   --   --  8.8*  MG  --   --   --  1.4*  --   --  1.5*  --   --  1.9   < > = values in  this interval not displayed.   GFR: Estimated Creatinine Clearance: 53 mL/min (A) (by C-G formula based on SCr of 1.14 mg/dL (H)). Liver Function Tests: Recent Labs  Lab 08/19/23 0855  AST 21  ALT 17  ALKPHOS 90  BILITOT 0.4  PROT 7.3  ALBUMIN 3.2*   No results for input(s): "LIPASE", "AMYLASE" in the last 168 hours. No results for input(s): "AMMONIA" in the last 168 hours. Coagulation Profile: No results for input(s): "INR", "PROTIME" in the last 168 hours. Cardiac Enzymes: No results for input(s): "CKTOTAL", "CKMB", "CKMBINDEX", "TROPONINI" in the last 168 hours. BNP (last 3 results) No results for input(s): "PROBNP" in the last 8760 hours. HbA1C: No results for input(s): "HGBA1C" in the last 72 hours. CBG: No results for input(s): "GLUCAP" in the last 168 hours. Lipid Profile: No results for input(s): "CHOL", "HDL", "LDLCALC", "TRIG", "CHOLHDL", "LDLDIRECT" in the last 72 hours. Thyroid Function Tests: Recent Labs    08/19/23 0855 08/20/23 0828  TSH 5.006*  --   FREET4  --  0.95   Anemia Panel: No results for input(s): "VITAMINB12", "FOLATE", "FERRITIN", "TIBC", "IRON", "RETICCTPCT" in the last 72 hours. Sepsis Labs: No results for input(s): "PROCALCITON", "LATICACIDVEN" in the last 168 hours.  Recent Results (from the past 240 hours)  MRSA Next Gen by PCR, Nasal     Status: None   Collection Time: 08/19/23 11:31 AM   Specimen: Nasal Mucosa; Nasal Swab  Result Value Ref Range Status   MRSA by PCR Next Gen NOT DETECTED NOT DETECTED Final    Comment: (NOTE) The GeneXpert MRSA Assay (FDA approved for NASAL specimens only), is one component of a comprehensive MRSA colonization surveillance program. It is not intended to diagnose MRSA infection nor to guide or monitor treatment for MRSA infections. Test performance is not FDA approved in patients less than 6 years old. Performed at [facility], [address] [place], [place] [address]          Radiology  Studies: EEG adult Result Date: 08/19/2023 Charlsie Quest, MD     08/19/2023  4:42 PM Patient Name: Cindy [name] MRN: [id number] Epilepsy Attending: Charlsie Quest Referring Physician/Provider: Erick Blinks, DO Date: 08/19/2023 Duration: 23.47 mins Patient history: 89 old female with seizure-like episode.  EEG to evaluate for seizure. Level of alertness: Awake AEDs during EEG study: Lamotrigine,  Depakote, Vimpat Technical aspects: This EEG study was done with scalp electrodes positioned according to the 10-20 International system of electrode placement. Electrical activity was reviewed with band pass filter of 1-70Hz , sensitivity of 7 uV/mm, display speed of 33mm/sec with a 60Hz  notched filter applied as appropriate. EEG data were recorded continuously and digitally stored.  Video monitoring was available and reviewed as appropriate. Description: The posterior dominant rhythm consists of 8 Hz activity of moderate voltage (25-35 uV) seen predominantly in posterior head regions, symmetric and reactive to eye opening and eye closing. EEG showed intermittent generalized 3 to 6 Hz theta-delta slowing. Hyperventilation and photic stimulation were not performed.    ABNORMALITY -Intermittent slow, generalized  IMPRESSION: This study is suggestive of mild  diffuse encephalopathy, nonspecific etiology. No seizures or definite epileptiform discharges were seen throughout the recording.  Priyanka [name]        Scheduled Meds:  aspirin EC  81 mg Oral Daily   atorvastatin  40 mg Oral QHS   Chlorhexidine Gluconate Cloth  6 each Topical Q0600   divalproex  500 mg Oral BID   folic acid  1 mg Oral Daily   heparin  5,000 Units Subcutaneous Q8H   lamoTRIgine  75 mg Oral BID   melatonin  3 mg Oral QHS   pantoprazole  40 mg Oral Daily   sodium chloride  1 g Oral TID WC      LOS: 2 days    Time spent: 55 minutes    Aylan Bayona Hoover Brunette, DO [facility]  If 7PM-7AM, please contact  night-coverage [email or web address] 08/21/2023, 10:26 AM

## 2023-08-22 DIAGNOSIS — R569 Unspecified convulsions: Secondary | ICD-10-CM | POA: Diagnosis not present

## 2023-08-22 LAB — MAGNESIUM: Magnesium: 1.7 mg/dL (ref 1.7–2.4)

## 2023-08-22 LAB — BASIC METABOLIC PANEL WITH GFR
Anion gap: 8 (ref 5–15)
BUN: 20 mg/dL (ref 8–23)
CO2: 20 mmol/L — ABNORMAL LOW (ref 22–32)
Calcium: 8.8 mg/dL — ABNORMAL LOW (ref 8.9–10.3)
Chloride: 100 mmol/L (ref 98–111)
Creatinine, Ser: 1.19 mg/dL — ABNORMAL HIGH (ref 0.44–1.00)
GFR, Estimated: 51 mL/min — ABNORMAL LOW (ref >60)
Glucose, Bld: 107 mg/dL — ABNORMAL HIGH (ref 70–99)
Potassium: 4.3 mmol/L (ref 3.5–5.1)
Sodium: 128 mmol/L — ABNORMAL LOW (ref 135–145)

## 2023-08-22 MED ORDER — SODIUM CHLORIDE 1 G PO TABS: 1.0000 g | ORAL_TABLET | Freq: Three times a day (TID) | ORAL | 0 refills | Status: AC

## 2023-08-22 MED ORDER — DIAZEPAM (15 MG DOSE) 2 X 7.5 MG/0.1ML NA LQPK: 7.5000 mg | Freq: Once | NASAL | 0 refills | Status: DC | PRN

## 2023-08-22 NOTE — Discharge Summary (Signed)
 Physician Discharge Summary  CELESTIAL BARNFIELD VHQ:[id number] DOB: [birth year] DOA: 08/19/2023  PCP: Shelby Dubin, FNP  Admit date: 08/19/2023  Discharge date: 08/22/2023  Admitted From:Home  Disposition:  Home  Recommendations for Outpatient Follow-up:  Follow up with PCP in 1-2 weeks Please obtain BMP in one week to follow the sodium levels Continue sodium chloride tablets for one week until further follow-up Continue home medications as prior Given refill on Valtoco  Home Health: None  Equipment/Devices: None  Discharge Condition:Stable  CODE STATUS: DNR  Diet recommendation: Heart Healthy with 1200 mL fluid restriction  Brief/Interim Summary: CHARLYE SPARE is a 63 y.o. female with medical history significant for seizures, strokes, CKD stage IIIa, hypertension, and deconditioning/dysarthria who presented to the ED with noted seizure at home at approximately 6:30 AM.  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  Given the severity of her hyponatremia, she was started on hypertonic saline for over 24 hours with slow and steady improvement.  Sodium levels are remaining stable at 128 with fluid restriction and ongoing sodium tablet administration.  Counseled extensively on ensuring minimal fluid intake and patient and sister at bedside both understand.  She will need close follow-up with PCP for repeat BMP to monitor sodium levels and ensure stability.  No further seizure activity noted while inpatient.  Discharge Diagnoses:  Principal Problem:   Seizure [facility])  Principal discharge diagnosis: Breakthrough seizure in the setting of severe hyponatremia with noted SIADH.  Discharge Instructions  Discharge Instructions     Diet - low sodium heart healthy   Complete by: As directed    Increase activity slowly   Complete by: As directed       Allergies as of 08/22/2023   No Known Allergies      Medication  List     STOP taking these medications    lacosamide 200 MG Tabs tablet Commonly known as: VIMPAT   multivitamin with minerals Tabs tablet   OLANZapine 2.5 MG tablet Commonly known as: ZYPREXA   pantoprazole 40 MG tablet Commonly known as: PROTONIX       TAKE these medications    acetaminophen 325 MG tablet Commonly known as: TYLENOL Take 2 tablets (650 mg total) by mouth every 6 (six) hours as needed (pain).   aspirin 325 MG tablet Take 1 tablet (325 mg total) by mouth daily.   atorvastatin 40 MG tablet Commonly known as: LIPITOR Take 1 tablet (40 mg total) by mouth at bedtime.   diazePAM (15 MG Dose) 2 x 7.5 MG/0.1ML Lqpk Commonly known as: Valtoco 15 MG Dose Place 7.5 mg into the nose once as needed for up to 1 dose (valtoco ( 7.5 mg in each nostril) for seizure lasting more than 2 minutes.). What changed: medication strength   divalproex 500 MG DR tablet Commonly known as: DEPAKOTE Take 1 tablet (500 mg total) by mouth 2 (two) times daily.   docusate sodium 50 MG capsule Commonly known as: COLACE Take 50 mg by mouth daily.   folic acid 1 MG tablet Commonly known as: FOLVITE Take 1 tablet (1 mg total) by mouth daily.   lamoTRIgine 25 MG tablet Commonly known as: LAMICTAL Take 75 mg by mouth 2 (two) times daily.   levothyroxine 25 MCG tablet Commonly known as: SYNTHROID Take 25 mcg by mouth every morning.   lisinopril 5 MG tablet Commonly known as: ZESTRIL Take 2.5 mg by mouth daily.  melatonin 3 MG Tabs tablet Take 1 tablet (3 mg total) by mouth at bedtime.   metFORMIN 500 MG tablet Commonly known as: GLUCOPHAGE Take 500 mg by mouth daily.   omeprazole 40 MG capsule Commonly known as: PRILOSEC Take 40 mg by mouth every morning.   sodium chloride 1 g tablet Take 1 tablet (1 g total) by mouth 3 (three) times daily with meals for 7 days.        Follow-up Information     Bucio, Julian Reil, FNP. Schedule an appointment as soon as possible for  a visit in 1 week(s).   Specialty: Family Medicine Contact information: [address] [place] [address] [phone]                No Known Allergies  Consultations: None   Procedures/Studies: EEG adult Result Date: 08/19/2023 Charlsie Quest, MD     08/19/2023  4:42 PM Patient Name: KIONA BLUME MRN: [id number] Epilepsy Attending: Charlsie Quest Referring Physician/Provider: Erick Blinks, DO Date: 08/19/2023 Duration: 23.47 mins Patient history: 77 old female with seizure-like episode.  EEG to evaluate for seizure. Level of alertness: Awake AEDs during EEG study: Lamotrigine, Depakote, Vimpat Technical aspects: This EEG study was done with scalp electrodes positioned according to the 10-20 International system of electrode placement. Electrical activity was reviewed with band pass filter of 1-70Hz , sensitivity of 7 uV/mm, display speed of 75mm/sec with a 60Hz  notched filter applied as appropriate. EEG data were recorded continuously and digitally stored.  Video monitoring was available and reviewed as appropriate. Description: The posterior dominant rhythm consists of 8 Hz activity of moderate voltage (25-35 uV) seen predominantly in posterior head regions, symmetric and reactive to eye opening and eye closing. EEG showed intermittent generalized 3 to 6 Hz theta-delta slowing. Hyperventilation and photic stimulation were not performed.    ABNORMALITY -Intermittent slow, generalized  IMPRESSION: This study is suggestive of mild  diffuse encephalopathy, nonspecific etiology. No seizures or definite epileptiform discharges were seen throughout the recording.  Charlsie Quest   CT Head Wo Contrast Result Date: 08/19/2023 CLINICAL DATA:  63 year old female with seizure this morning. EXAM: CT HEAD WITHOUT CONTRAST TECHNIQUE: Contiguous axial images were obtained from the base of the skull through the vertex without intravenous contrast. RADIATION DOSE REDUCTION: This exam was performed  according to the departmental dose-optimization program which includes automated exposure control, adjustment of the mA and/or kV according to patient size and/or use of iterative reconstruction technique. COMPARISON:  Brain MRI 11/19/2022.  Head CT 11/19/2022. FINDINGS: Brain: Stable cerebral volume. No midline shift, ventriculomegaly, mass effect, evidence of mass lesion, intracranial hemorrhage or evidence of cortically based acute infarction. Chronic cerebral white matter hypodensity, deep white matter capsule involvement. Chronic right thalamic lacunar infarct. Stable gray-white matter differentiation throughout the brain. Vascular: Calcified atherosclerosis at the skull base. No suspicious intracranial vascular hyperdensity. Skull: Stable, intact. Sinuses/Orbits: Visualized paranasal sinuses and mastoids are stable and well aerated. Other: Rightward gaze. No orbit or scalp soft tissue injury identified. IMPRESSION: 1. No acute intracranial abnormality or acute traumatic injury identified. 2. Stable non contrast CT appearance of chronic small vessel disease. Electronically Signed   By: Odessa Fleming M.D.   On: 08/19/2023 09:25     Discharge Exam: Vitals:   08/21/23 2000 08/22/23 0426  BP: 120/73 127/71  Pulse: 92 84  Resp: 20 18  Temp: 98.8 F (37.1 C) 98.5 F (36.9 C)  SpO2: 97% 98%   Vitals:  08/21/23 0449 08/21/23 1414 08/21/23 2000 08/22/23 0426  BP: 132/74 119/80 120/73 127/71  Pulse:  95 92 84  Resp: 18 16 20 18   Temp: 98.9 F (37.2 C) 98.4 F (36.9 C) 98.8 F (37.1 C) 98.5 F (36.9 C)  TempSrc: Oral Oral Oral Oral  SpO2: 95% 97% 97% 98%  Weight:      Height:        General: Pt is alert, awake, not in acute distress Cardiovascular: RRR, S1/S2 +, no rubs, no gallops Respiratory: CTA bilaterally, no wheezing, no rhonchi Abdominal: Soft, NT, ND, bowel sounds + Extremities: no edema, no cyanosis    The results of significant diagnostics from this hospitalization (including  imaging, microbiology, ancillary and laboratory) are listed below for reference.     Microbiology: Recent Results (from the past 240 hours)  MRSA Next Gen by PCR, Nasal     Status: None   Collection Time: 08/19/23 11:31 AM   Specimen: Nasal Mucosa; Nasal Swab  Result Value Ref Range Status   MRSA by PCR Next Gen NOT DETECTED NOT DETECTED Final    Comment: (NOTE) The GeneXpert MRSA Assay (FDA approved for NASAL specimens only), is one component of a comprehensive MRSA colonization surveillance program. It is not intended to diagnose MRSA infection nor to guide or monitor treatment for MRSA infections. Test performance is not FDA approved in patients less than 72 years old. Performed at [facility], [address], [place], [place] [address]      Labs: BNP (last 3 results) Recent Labs    11/20/22 0715  BNP 244.7*   Basic Metabolic Panel: Recent Labs  Lab 08/19/23 0855 08/19/23 1219 08/19/23 1652 08/19/23 2014 08/20/23 0027 08/20/23 0425 08/20/23 0828 08/20/23 1142 08/20/23 2019 08/21/23 0511 08/22/23 0530  NA 119*   < > 121* 120*   < > 125* 126* 126* 126* 128* 128*  K 5.2*  --  4.8  --   --  4.0  --   --   --  4.1 4.3  CL 87*  --  89*  --   --  97*  --   --   --  99 100  CO2 22  --  19*  --   --  21*  --   --   --  21* 20*  GLUCOSE 108*  --  92  --   --  90  --   --   --  93 107*  BUN 17  --  14  --   --  12  --   --   --  15 20  CREATININE 1.02*  --  0.94  --   --  0.93  --   --   --  1.14* 1.19*  CALCIUM 9.1  --  8.9  --   --  8.7*  --   --   --  8.8* 8.8*  MG  --   --   --  1.4*  --   --  1.5*  --   --  1.9 1.7   < > = values in this interval not displayed.   Liver Function Tests: Recent Labs  Lab 08/19/23 0855  AST 21  ALT 17  ALKPHOS 90  BILITOT 0.4  PROT 7.3  ALBUMIN 3.2*   No results for input(s): "LIPASE", "AMYLASE" in the last 168 hours. No results for input(s): "AMMONIA" in the last 168 hours. CBC: Recent Labs  Lab 08/19/23 0855  08/20/23 0425 08/21/23 6045  WBC 9.6 7.2 8.0  NEUTROABS 7.7  --   --   HGB 12.0 10.4* 11.7*  HCT 34.4* 30.8* 34.7*  MCV 92.0 92.8 94.0  PLT 244 231 263   Cardiac Enzymes: No results for input(s): "CKTOTAL", "CKMB", "CKMBINDEX", "TROPONINI" in the last 168 hours. BNP: Invalid input(s): "POCBNP" CBG: No results for input(s): "GLUCAP" in the last 168 hours. D-Dimer No results for input(s): "DDIMER" in the last 72 hours. Hgb A1c No results for input(s): "HGBA1C" in the last 72 hours. Lipid Profile No results for input(s): "CHOL", "HDL", "LDLCALC", "TRIG", "CHOLHDL", "LDLDIRECT" in the last 72 hours. Thyroid function studies No results for input(s): "TSH", "T4TOTAL", "T3FREE", "THYROIDAB" in the last 72 hours.  Invalid input(s): "FREET3" Anemia work up No results for input(s): "VITAMINB12", "FOLATE", "FERRITIN", "TIBC", "IRON", "RETICCTPCT" in the last 72 hours. Urinalysis    Component Value Date/Time   COLORURINE YELLOW 11/20/2022 0645   APPEARANCEUR CLOUDY (A) 11/20/2022 0645   LABSPEC 1.033 (H) 11/20/2022 0645   PHURINE 7.0 11/20/2022 0645   GLUCOSEU NEGATIVE 11/20/2022 0645   HGBUR MODERATE (A) 11/20/2022 0645   BILIRUBINUR NEGATIVE 11/20/2022 0645   KETONESUR NEGATIVE 11/20/2022 0645   PROTEINUR NEGATIVE 11/20/2022 0645   NITRITE NEGATIVE 11/20/2022 0645   LEUKOCYTESUR SMALL (A) 11/20/2022 0645   Sepsis Labs Recent Labs  Lab 08/19/23 0855 08/20/23 0425 08/21/23 0511  WBC 9.6 7.2 8.0   Microbiology Recent Results (from the past 240 hours)  MRSA Next Gen by PCR, Nasal     Status: None   Collection Time: 08/19/23 11:31 AM   Specimen: Nasal Mucosa; Nasal Swab  Result Value Ref Range Status   MRSA by PCR Next Gen NOT DETECTED NOT DETECTED Final    Comment: (NOTE) The GeneXpert MRSA Assay (FDA approved for NASAL specimens only), is one component of a comprehensive MRSA colonization surveillance program. It is not intended to diagnose MRSA infection nor to  guide or monitor treatment for MRSA infections. Test performance is not FDA approved in patients less than 63 years old. Performed at [facility], [address], , [place] [address]      Time coordinating discharge: 35 minutes  SIGNED:   Erick Blinks, DO [facility] 08/22/2023, 10:02 AM  If 7PM-7AM, please contact night-coverage [email or web address]

## 2023-09-15 ENCOUNTER — Encounter: Attending: Family Medicine | Admitting: Nutrition

## 2023-09-15 DIAGNOSIS — R7303 Prediabetes: Secondary | ICD-10-CM | POA: Diagnosis present

## 2023-09-15 NOTE — Patient Instructions (Addendum)
 Goals  Eat three meals per day B) 8, Lunch 12-2 D) 5-7 pm Drink 3 bottles of water per day Cut out koolaid Increase fruits, vegetables and whole grains. Stop Boost  Increase high fiber foods  Add prunes or raisins for bowel movements. USe Mrs. Dash and  other herbs and spices instead of salt when cooking

## 2023-09-15 NOTE — Progress Notes (Signed)
 Medical Nutrition Therapy  Appointment Start time:  [phone]  Appointment End time:  1100  Primary concerns today: Pre Dm  Referral diagnosis: R73.03 Preferred learning style: No preference  Learning readiness: Ready   NUTRITION ASSESSMENT  63 yr old wfemale here for prediabetes.  She is here with her sister who is her caregiver. Pre DM A1C 6.4, down to 6.2%. On Metformin 500 mg once a day. Typically eats 2 meals per day; drinks boost and snacks some. Her sister prepares her meals. She is non ambulatory and in a wheelchair. No weight taken today. Has CKD Stg 3 according to her sister. H/o constipation. Diet is poor in high fiber foods and whole plant  based foods. Diet is low in water intake. Had been advised at one time to limit water intake when sodium level was low.  No chewing or swallowing problems noted. Appetite is good.. Has gained about 20-30 lbs in the last 3 years living with her sister, who cares for her.   Clinical Medical Hx:  Past Medical History:  Diagnosis Date   Abnormal LFTs    Hx of   CVA (cerebral vascular accident) (HCC) 2005   Hyperlipidemia    Hypertension    Polysubstance dependence including opioid type drug, episodic abuse (HCC)    cocaine/etoh    Seizures (HCC)    Medications:  Current Outpatient Medications on File Prior to Visit  Medication Sig Dispense Refill   aspirin  325 MG tablet Take 1 tablet (325 mg total) by mouth daily.     atorvastatin  (LIPITOR) 40 MG tablet Take 1 tablet (40 mg total) by mouth at bedtime. 30 tablet 1   divalproex  (DEPAKOTE ) 500 MG DR tablet Take 1 tablet (500 mg total) by mouth 2 (two) times daily. 60 tablet 2   folic acid  (FOLVITE ) 1 MG tablet Take 1 tablet (1 mg total) by mouth daily. 30 tablet 1   lamoTRIgine  (LAMICTAL ) 25 MG tablet Take 75 mg by mouth 2 (two) times daily.     levothyroxine (SYNTHROID) 25 MCG tablet Take 25 mcg by mouth every morning.     lisinopril  (ZESTRIL ) 5 MG tablet Take 2.5 mg by mouth daily.      melatonin 3 MG TABS tablet Take 1 tablet (3 mg total) by mouth at bedtime. 30 tablet 0   metFORMIN (GLUCOPHAGE) 500 MG tablet Take 500 mg by mouth daily.     acetaminophen  (TYLENOL ) 325 MG tablet Take 2 tablets (650 mg total) by mouth every 6 (six) hours as needed (pain).     diazePAM , 15 MG Dose, (VALTOCO  15 MG DOSE) 2 x 7.5 MG/0.1ML LQPK Place 7.5 mg into the nose once as needed for up to 1 dose (valtoco  ( 7.5 mg in each nostril) for seizure lasting more than 2 minutes.). (Patient not taking: Reported on 09/15/2023) 10 each 0   docusate sodium (COLACE) 50 MG capsule Take 50 mg by mouth daily.     omeprazole (PRILOSEC) 40 MG capsule Take 40 mg by mouth every morning. (Patient not taking: Reported on 09/15/2023)     No current facility-administered medications on file prior to visit.    Labs:  Lab Results  Component Value Date   HGBA1C 6.4 (H) 11/19/2022    Notable Signs/Symptoms:none  Lifestyle & Dietary Hx She lives with sister and her husband.10  Estimated daily fluid intake: 10 oz Supplements: Boost Sleep: 8 hrs Stress / self-care: cared by her sister.  Current average weekly physical activity: wheelchair  24-Hr Dietary Recall  First Meal: 830 sausage and packet of oatmeal pkt and 1/2 slice toast with jelly Snack: Boost 12 oz Second Meal: doesn't eat lunch Snack:  Third Meal: Chicken fingers 8, tator tots, Koolaid 10 lz Snack:  Beverages: koolaid, water, boost  Estimated Energy Needs Calories: 1200 Carbohydrate: 135g Protein: 90g Fat: 33g   NUTRITION DIAGNOSIS  NB-1.1 Food and nutrition-related knowledge deficit As related to Prediabetes.  As evidenced by A1c 6.4%.   NUTRITION INTERVENTION  Nutrition education (E-1) on the following topics:  Nutrition and Diabetes education provided on My Plate, CHO counting, meal planning, portion sizes, timing of meals, avoiding snacks between meals unless having a low blood sugar, target ranges for A1C and blood sugars,  signs/symptoms and treatment of hyper/hypoglycemia, monitoring blood sugars, taking medications as prescribed, benefits of exercising 30 minutes per day and prevention of complications of DM.  Lifestyle Medicine  - Whole Food, Plant Predominant Nutrition is highly recommended: Eat Plenty of vegetables, Mushrooms, fruits, Legumes, Whole Grains, Nuts, seeds in lieu of processed meats, processed snacks/pastries red meat, poultry, eggs.    -It is better to avoid simple carbohydrates including: Cakes, Sweet Desserts, Ice Cream, Soda (diet and regular), Sweet Tea, Candies, Chips, Cookies, Store Bought Juices, Alcohol in Excess of  1-2 drinks a day, Lemonade,  Artificial Sweeteners, Doughnuts, Coffee Creamers, "Sugar-free" Products, etc, etc.  This is not a complete list.....  Exercise: If you are able: 30 -60 minutes a day ,4 days a week, or 150 minutes a week.  The longer the better.  Combine stretch, strength, and aerobic activities.  If you were told in the past that you have high risk for cardiovascular diseases, you may seek evaluation by your heart doctor prior to initiating moderate to intense exercise programs.   Handouts Provided Include  My Plate Diabetes instructions Nutrition for lifestyle Full plate living  Learning Style & Readiness for Change Teaching method utilized: Visual & Auditory  Demonstrated degree of understanding via: Teach Back  Barriers to learning/adherence to lifestyle change: none  Goals Established by Pt Goals  Eat three meals per day B) 8, Lunch 12-2 D) 5-7 pm Drink 3 bottles of water per day Cut out koolaid Increase fruits, vegetables and whole grains. Stop Boost  Increase high fiber foods  Add prunes or raisins for bowel movements. USe Mrs. Dash and  other herbs and spices instead of salt when cooking   MONITORING & EVALUATION Dietary intake, weekly physical activity,  in 3 months.  Next Steps  Patient is to work on eating balanced meals .

## 2023-10-09 NOTE — Progress Notes (Signed)
 Today the history is gathered from: 0% - patient  100% - patient's sister  RECORDS SUMMARY: I have reviewed the note dated 11/19/2022 from [place] who has indicated:  Seizure  Given these abnormal neurologic findings, a referral to neurology has been recommended.  REFERRING PHYSICIAN: Lane Arthea Locus, MD PRIMARY CARE PHYSICIAN:  Lane Arthea Locus, MD   IMPRESSION/PLAN  Cindy [name] is a 63 y.o. female presenting for evaluation of  SEIZURE/ STROKE/ CONFUSION/ STARING SPELL/ WEAKNESS/ BALANCE DIFFICULTY/ MEMORY LOSS/ Ongoing  Patient was last seen in our office 12/17/2022 by Dr. Lane for evaluation and management of seizures, at that time she was advised to follow-up in 2 to 3 months, however she was last follow-up until now.  Patient is accompanied by her sister at today's office visit who is her primary caretaker.  Patient was recently hospitalized 08/19/2023 with severe hyponatremia and seizure.  Patient has had no recurrence of seizures since sodium was corrected.  EEG during hospitalization did not show epileptiform discharges.  She is currently taking Lamictal  75 mg twice daily and Depakote  500 mg twice daily.  Memory evaluation today (10/09/2023): 10/29 Reviewed hospital admission records including provider notes and EEG results Continue Aspirin  325 mg daily  Continue Lamictal  75 mg twice daily  Continue Depakote  500 mg twice daily Offered referral for home health and/or social work, patient and sister deferred at this time. Obtain Depakote  and Lamictal  level at upcoming PCP visit where she is already scheduled for labs, printed order provided.  Patient's sister aware to request that lab results be shared with our office. Continue present management otherwise   Medications previously tried: Vimpat  200 mg BID (Hallucinations)  Follow-up with Dr. Lane in 3-4 months, sooner if needed.  p=   CHIEF COMPLAINT & HPI  Cindy [name] is a 63 y.o. female presenting for evaluation  of: Chief Complaint  Patient presents with  . SEIZURE/ STROKE/ CONFUSION/ STARING SPELL  . WEAKNESS/ BALANCE/ MEMORY LOSS    SEIZURE/ STROKE/ CONFUSION/ STARING SPELL/ WEAKNESS/ BALANCE/ MEMORY LOSS/ Memory evaluation today is 10/29. Patient was last seen in our office 12/17/2022 by Dr. Lane for evaluation and management of seizures, at that time she was advised to follow-up in 2 to 3 months, however she was last follow-up until now.  Patient is accompanied by her sister at today's office visit who is her primary caretaker.  Patient was recently hospitalized 08/19/2023 with severe hyponatremia and seizure.  Patient has had no recurrence of seizures since sodium was corrected.  EEG during hospitalization did not show epileptiform discharges.  She is currently taking Lamictal  75 mg twice daily and Depakote  500 mg twice daily.    DATA SUMMARY: 08/19/2023 INPATIENT EEG IMPRESSION: This study is suggestive of mild  diffuse encephalopathy, nonspecific etiology. No seizures or definite epileptiform discharges were seen throughout the recording.  08/19/2023 CT HEAD WITHOUT CONTRAST IMPRESSION:  1. No acute intracranial abnormality or acute traumatic injury  identified.  2. Stable non contrast CT appearance of chronic small vessel  disease.   11/19/2022 MR BRAIN W WO CONTRAST IMPRESSION:  1. Significantly motion degraded examination, as described.  2. Within this limitation, no acute intracranial abnormality is  identified. The diffusion-weighted imaging is of good quality and  there is no evidence of an acute infarct.  3. Redemonstrated chronic lacunar infarcts within the bilateral  cerebral hemispheric white matter and within the bilateral deep gray  nuclei.  4. Redemonstrated small chronic infarct within the left cerebellar  hemisphere.  5. Background  advanced cerebral white matter disease, nonspecific  but favored to reflect sequelae of chronic small vessel ischemia  (given the  presence of multiple chronic infarcts within the  supratentorial and infratentorial brain).  6. Cerebral and cerebellar atrophy, significantly advanced for age.  7. Mild right maxillary sinus mucosal thickening.  8. Small-volume fluid within the bilateral mastoid air cells.   11/19/2022 CTA HEAD NECK W WO CONTRAST IMPRESSION:  1. No large vessel occlusion.  2. Atherosclerotic disease at both carotid bifurcations but without  stenosis.  3. Atherosclerotic disease in both vertebral artery V4 segments with  narrowing estimated at 30-50% on both sides   11/19/2022 CT HEAD WO CONTRAST IMPRESSION:  No acute intracranial abnormality.  08/28/2020 MR BRAIN WO CONTRAST IMPRESSION:  1. No acute intracranial abnormality. The examination had to be  discontinued prior to completion due to patient agitation.  2. Advanced but nonspecific cerebral white matter signal changes.  Favor small vessel disease related given evidence of chronic lacunar  infarcts in the left corona radiata, the bilateral deep gray matter  nuclei, and probably also the left cerebellum.  3. Mild new bilateral paranasal sinus inflammation.   08/23/2020 CT C-SPINE IMPRESSION:  1. Marked severity multilevel degenerative changes, most prominent  at the levels of C4-C5, C5-C6 and C6-C7.  2. No acute cervical spine fracture.   08/23/2020 MR BRAIN W WO CONTRAST IMPRESSION:  1. No acute intracranial abnormality.  2. Remote lacunar infarct in the bilateral centrum semiovale, corona  radiata and right thalamus.  3. Advanced chronic microvascular ischemic changes. Mild parenchymal  volume loss.  4. Small right mastoid effusion.   08/23/2020 CTA HEAD NECK W WO CONTRAST IMPRESSION:  No large vessel occlusion or hemodynamically significant stenosis.  Mild left mid M1 MCA stenosis.   Perfusion imaging demonstrates no evidence of core infarction or  penumbra. 8 mL of calculated penumbra in the bilateral anterior  frontal  lobes is likely artifactual.   08/23/2020 CT HEAD CODE STROKE IMPRESSION:  There is no acute intracranial hemorrhage or evidence of acute  infarction. ASPECT score is 10.   02/09/2020 CT HEAD WO CONTRAST IMPRESSION:  1. No acute intracranial abnormality.  2. Generalized atrophy and chronic small vessel ischemia similar to  prior exam but advanced for age. Lacunar infarct in the right basal  ganglia appears remote, but new from March 2021.  3. Fluid level with frothy debris in the left side of the sphenoid  sinus, can be seen with acute sinusitis.   08/10/2019 MR BRAIN WO CONTRAST IMPRESSION:  1. Scattered small foci of restricted diffusion within the bilateral  cerebral hemispheres, consistent with small acute infarcts, likely  embolic.  2. Advanced white matter disease, more pronounced than expected for  age.  3. Mild inflammatory paranasal sinus disease and bilateral mastoid  effusions.   08/05/2019 CT HEAD WO CONTRAST 1. No acute intracranial pathology.  2. Extensive periventricular and deep white matter hypodensity  markedly increased compared to remote prior CT dated 2006. Findings  may reflect small-vessel white matter disease or alternately  demyelinating disorder such as multiple sclerosis. Correlate with  referable clinical history.   VISIT SUMMARIES:   MEDICATIONS Current Outpatient Medications  Medication Sig Dispense Refill  . acetaminophen  (TYLENOL ) 500 mg capsule Take by mouth every morning    . atorvastatin  (LIPITOR) 40 MG tablet Take 40 mg by mouth once daily    . diazePAM  (VALTOCO ) 7.5 mg/spray (0.1mL) nasal spray Place into both nostrils once daily as needed (Take as needed  if seizure lasts more than 2 mins)    . divalproex  (DEPAKOTE ) 500 MG DR tablet Take 500 mg by mouth 2 (two) times daily    . folic acid  (FOLVITE ) 1 MG tablet Take 1 mg by mouth once daily    . lamoTRIgine  (LAMICTAL ) 25 MG tablet TAKE THREE TABLETS BY MOUTH TWICE DAILY 180 tablet 0   . lisinopriL  (ZESTRIL ) 5 MG tablet Take 2.5 mg by mouth once daily 1/2 tablet (2.5 mg) daily    . melatonin 3 mg Cap Take 3 mg by mouth at bedtime    . pantoprazole  (PROTONIX ) 40 MG DR tablet Take 40 mg by mouth once daily    . aspirin  81 MG chewable tablet Take 81 mg by mouth once daily (Patient not taking: Reported on 10/09/2023)    . lacosamide  (VIMPAT ) 200 mg tablet Take 200 mg by mouth every 12 (twelve) hours (Patient not taking: Reported on 10/09/2023)    . OLANZapine  (ZYPREXA ) 2.5 MG tablet Take 2.5 mg by mouth at bedtime For sleep (Patient not taking: Reported on 10/09/2023)     No current facility-administered medications for this visit.    ALLERGIES No Known Allergies   EXAM  MEMORY EVALUATION: 10/09/2023 - 10/29  Vitals:   10/09/23 0957  BP: 137/81  Pulse: 82  PainSc: 0-No pain    There is no height or weight on file to calculate BMI.  GENERAL: Pleasant female. No acute distress. Normocephalic and atraumatic.   PAST MEDICAL HISTORY Past Medical History:  Diagnosis Date  . History of stroke   . Kidney disease     PAST SURGICAL HISTORY History reviewed. No pertinent surgical history.  FAMILY HISTORY Family History  Problem Relation Name Age of Onset  . Ovarian cancer Mother    . Prostate cancer Father      SOCIAL HISTORY  Social History   Tobacco Use  . Smoking status: Never  . Smokeless tobacco: Never  Vaping Use  . Vaping status: Never Used  Substance Use Topics  . Alcohol use: Not Currently  . Drug use: Not Currently     REVIEW OF SYSTEMS:  13 system ROS was verbally reviewed with patient. Pertinent positives and negatives are mentioned above in the HPI and all other systems are negative.  DATA  I have personally reviewed all of the data outlined below both prior to the appointment and during the appointment with the patient as appropriate.  No visits with results within 6 Month(s) from this visit.  Latest known visit with results is:   No results found for any previous visit.      No follow-ups on file.  Payor: MEDICAID [place] / Plan: MEDICAID [place] ACCESS / Product Type: Medicaid /   This note is partially written by Lauraine Hales, scribe, in the presence of and acting as the scribe of [employer], PA-C.      Attestation Statement:   I personally performed the service, non-incident to. (WP)   CELESTE CRAFT CANTWELL, PA

## 2023-10-22 ENCOUNTER — Other Ambulatory Visit: Payer: Self-pay | Admitting: Family Medicine

## 2023-10-22 DIAGNOSIS — Z1231 Encounter for screening mammogram for malignant neoplasm of breast: Secondary | ICD-10-CM

## 2023-11-06 ENCOUNTER — Other Ambulatory Visit (HOSPITAL_COMMUNITY): Payer: Self-pay | Admitting: Occupational Therapy

## 2023-11-06 DIAGNOSIS — R1312 Dysphagia, oropharyngeal phase: Secondary | ICD-10-CM

## 2023-11-06 DIAGNOSIS — R059 Cough, unspecified: Secondary | ICD-10-CM

## 2023-11-07 ENCOUNTER — Encounter (HOSPITAL_COMMUNITY): Payer: Self-pay | Admitting: Emergency Medicine

## 2023-11-07 ENCOUNTER — Other Ambulatory Visit: Payer: Self-pay

## 2023-11-07 ENCOUNTER — Observation Stay (HOSPITAL_COMMUNITY)
Admission: EM | Admit: 2023-11-07 | Discharge: 2023-11-08 | Disposition: A | Discharge status: Home / Self Care | Attending: Internal Medicine | Admitting: Internal Medicine

## 2023-11-07 DIAGNOSIS — E871 Hypo-osmolality and hyponatremia: Principal | ICD-10-CM | POA: Diagnosis present

## 2023-11-07 DIAGNOSIS — Z7982 Long term (current) use of aspirin: Secondary | ICD-10-CM | POA: Insufficient documentation

## 2023-11-07 DIAGNOSIS — Z79899 Other long term (current) drug therapy: Secondary | ICD-10-CM | POA: Insufficient documentation

## 2023-11-07 DIAGNOSIS — N1831 Chronic kidney disease, stage 3a: Secondary | ICD-10-CM | POA: Diagnosis not present

## 2023-11-07 DIAGNOSIS — Z8673 Personal history of transient ischemic attack (TIA), and cerebral infarction without residual deficits: Secondary | ICD-10-CM | POA: Insufficient documentation

## 2023-11-07 DIAGNOSIS — G40909 Epilepsy, unspecified, not intractable, without status epilepticus: Secondary | ICD-10-CM | POA: Insufficient documentation

## 2023-11-07 DIAGNOSIS — I129 Hypertensive chronic kidney disease with stage 1 through stage 4 chronic kidney disease, or unspecified chronic kidney disease: Secondary | ICD-10-CM | POA: Insufficient documentation

## 2023-11-07 DIAGNOSIS — Z609 Problem related to social environment, unspecified: Secondary | ICD-10-CM | POA: Diagnosis not present

## 2023-11-07 DIAGNOSIS — D649 Anemia, unspecified: Secondary | ICD-10-CM | POA: Insufficient documentation

## 2023-11-07 LAB — COMPREHENSIVE METABOLIC PANEL WITH GFR
ALT: 13 U/L (ref 0–44)
AST: 19 U/L (ref 15–41)
Albumin: 3.3 g/dL — ABNORMAL LOW (ref 3.5–5.0)
Alkaline Phosphatase: 84 U/L (ref 38–126)
Anion gap: 9 (ref 5–15)
BUN: 15 mg/dL (ref 8–23)
CO2: 24 mmol/L (ref 22–32)
Calcium: 9.2 mg/dL (ref 8.9–10.3)
Chloride: 88 mmol/L — ABNORMAL LOW (ref 98–111)
Creatinine, Ser: 1.22 mg/dL — ABNORMAL HIGH (ref 0.44–1.00)
GFR, Estimated: 50 mL/min — ABNORMAL LOW (ref >60)
Glucose, Bld: 101 mg/dL — ABNORMAL HIGH (ref 70–99)
Potassium: 4.7 mmol/L (ref 3.5–5.1)
Sodium: 121 mmol/L — ABNORMAL LOW (ref 135–145)
Total Bilirubin: 0.5 mg/dL (ref 0.0–1.2)
Total Protein: 7 g/dL (ref 6.5–8.1)

## 2023-11-07 LAB — CBC WITH DIFFERENTIAL/PLATELET
Abs Immature Granulocytes: 0.08 10*3/uL — ABNORMAL HIGH (ref 0.00–0.07)
Basophils Absolute: 0 10*3/uL (ref 0.0–0.1)
Basophils Relative: 0 %
Eosinophils Absolute: 0.1 10*3/uL (ref 0.0–0.5)
Eosinophils Relative: 1 %
HCT: 31.2 % — ABNORMAL LOW (ref 36.0–46.0)
Hemoglobin: 10.9 g/dL — ABNORMAL LOW (ref 12.0–15.0)
Immature Granulocytes: 1 %
Lymphocytes Relative: 27 %
Lymphs Abs: 2.2 10*3/uL (ref 0.7–4.0)
MCH: 32.8 pg (ref 26.0–34.0)
MCHC: 34.9 g/dL (ref 30.0–36.0)
MCV: 94 fL (ref 80.0–100.0)
Monocytes Absolute: 0.9 10*3/uL (ref 0.1–1.0)
Monocytes Relative: 11 %
Neutro Abs: 5 10*3/uL (ref 1.7–7.7)
Neutrophils Relative %: 60 %
Platelets: 362 10*3/uL (ref 150–400)
RBC: 3.32 MIL/uL — ABNORMAL LOW (ref 3.87–5.11)
RDW: 14 % (ref 11.5–15.5)
WBC: 8.3 10*3/uL (ref 4.0–10.5)
nRBC: 0 % (ref 0.0–0.2)

## 2023-11-07 LAB — MAGNESIUM: Magnesium: 1.8 mg/dL (ref 1.7–2.4)

## 2023-11-07 LAB — HEMOGLOBIN A1C
Hgb A1c MFr Bld: 6.1 % — ABNORMAL HIGH (ref 4.8–5.6)
Mean Plasma Glucose: 128.37 mg/dL

## 2023-11-07 LAB — GLUCOSE, CAPILLARY
Glucose-Capillary: 91 mg/dL (ref 70–99)
Glucose-Capillary: 87 mg/dL (ref 70–99)
Glucose-Capillary: 137 mg/dL — ABNORMAL HIGH (ref 70–99)

## 2023-11-07 MED ORDER — INSULIN ASPART 100 UNIT/ML IJ SOLN: 0.0000 [IU] | Freq: Every day | INTRAMUSCULAR | Status: DC

## 2023-11-07 MED ORDER — LEVOTHYROXINE SODIUM 25 MCG PO TABS
25.0000 ug | ORAL_TABLET | Freq: Every morning | ORAL | Status: DC
  Administered 2023-11-08: 25 ug via ORAL
  Filled 2023-11-07: qty 1

## 2023-11-07 MED ORDER — MELATONIN 3 MG PO TABS
3.0000 mg | ORAL_TABLET | Freq: Every day | ORAL | Status: DC
  Administered 2023-11-07: 3 mg via ORAL
  Filled 2023-11-07: qty 1

## 2023-11-07 MED ORDER — LAMOTRIGINE 25 MG PO TABS
75.0000 mg | ORAL_TABLET | Freq: Two times a day (BID) | ORAL | Status: DC
  Administered 2023-11-07 – 2023-11-08 (×3): 75 mg via ORAL
  Filled 2023-11-07 (×3): qty 3

## 2023-11-07 MED ORDER — ONDANSETRON HCL 4 MG PO TABS: 4.0000 mg | ORAL_TABLET | Freq: Four times a day (QID) | ORAL | Status: DC | PRN

## 2023-11-07 MED ORDER — FOLIC ACID 1 MG PO TABS
1.0000 mg | ORAL_TABLET | Freq: Every day | ORAL | Status: DC
  Administered 2023-11-07 – 2023-11-08 (×2): 1 mg via ORAL
  Filled 2023-11-07 (×2): qty 1

## 2023-11-07 MED ORDER — DIVALPROEX SODIUM 250 MG PO DR TAB
500.0000 mg | DELAYED_RELEASE_TABLET | Freq: Two times a day (BID) | ORAL | Status: DC
  Administered 2023-11-07 – 2023-11-08 (×3): 500 mg via ORAL
  Filled 2023-11-07 (×3): qty 2

## 2023-11-07 MED ORDER — LISINOPRIL 5 MG PO TABS: 5.0000 mg | ORAL_TABLET | Freq: Every day | ORAL | Status: DC

## 2023-11-07 MED ORDER — SODIUM CHLORIDE 1 G PO TABS
1.0000 g | ORAL_TABLET | Freq: Two times a day (BID) | ORAL | Status: DC
  Administered 2023-11-07 – 2023-11-08 (×3): 1 g via ORAL
  Filled 2023-11-07 (×6): qty 1

## 2023-11-07 MED ORDER — INSULIN ASPART 100 UNIT/ML IJ SOLN
0.0000 [IU] | Freq: Three times a day (TID) | INTRAMUSCULAR | Status: DC
  Administered 2023-11-08: 1 [IU] via SUBCUTANEOUS

## 2023-11-07 MED ORDER — ASPIRIN 325 MG PO TABS
325.0000 mg | ORAL_TABLET | Freq: Every day | ORAL | Status: DC
  Administered 2023-11-07 – 2023-11-08 (×2): 325 mg via ORAL
  Filled 2023-11-07 (×2): qty 1

## 2023-11-07 MED ORDER — ATORVASTATIN CALCIUM 40 MG PO TABS
40.0000 mg | ORAL_TABLET | Freq: Every day | ORAL | Status: DC
  Administered 2023-11-07: 40 mg via ORAL
  Filled 2023-11-07: qty 1

## 2023-11-07 MED ORDER — ACETAMINOPHEN 325 MG PO TABS
650.0000 mg | ORAL_TABLET | Freq: Four times a day (QID) | ORAL | Status: DC | PRN
Start: 2023-11-07 — End: 2023-11-08

## 2023-11-07 MED ORDER — DOCUSATE SODIUM 100 MG PO CAPS
100.0000 mg | ORAL_CAPSULE | Freq: Every day | ORAL | Status: DC
  Administered 2023-11-07 – 2023-11-08 (×2): 100 mg via ORAL
  Filled 2023-11-07 (×2): qty 1

## 2023-11-07 MED ORDER — ACETAMINOPHEN 650 MG RE SUPP: 650.0000 mg | Freq: Four times a day (QID) | RECTAL | Status: DC | PRN

## 2023-11-07 MED ORDER — LISINOPRIL 5 MG PO TABS
2.5000 mg | ORAL_TABLET | Freq: Every day | ORAL | Status: DC
  Administered 2023-11-07 – 2023-11-08 (×2): 2.5 mg via ORAL
  Filled 2023-11-07 (×2): qty 1

## 2023-11-07 MED ORDER — ONDANSETRON HCL 4 MG/2ML IJ SOLN: 4.0000 mg | Freq: Four times a day (QID) | INTRAMUSCULAR | Status: DC | PRN

## 2023-11-07 MED ORDER — HEPARIN SODIUM (PORCINE) 5000 UNIT/ML IJ SOLN
5000.0000 [IU] | Freq: Three times a day (TID) | INTRAMUSCULAR | Status: DC
  Administered 2023-11-07 – 2023-11-08 (×2): 5000 [IU] via SUBCUTANEOUS
  Filled 2023-11-07 (×2): qty 1

## 2023-11-07 MED ORDER — PANTOPRAZOLE SODIUM 40 MG PO TBEC
40.0000 mg | DELAYED_RELEASE_TABLET | Freq: Every day | ORAL | Status: DC
  Administered 2023-11-07 – 2023-11-08 (×2): 40 mg via ORAL
  Filled 2023-11-07 (×2): qty 1

## 2023-11-07 NOTE — ED Provider Notes (Signed)
  Physical Exam  BP 136/72   Pulse 76   Temp 98 F (36.7 C)   Resp 14   Ht 5' 7 (1.702 m)   Wt 78.9 kg   SpO2 98%   BMI 27.25 kg/m   Physical Exam  Procedures  Procedures  ED Course / MDM   Clinical Course as of 11/07/23 2009  Sat Nov 07, 2023  9278 Assumed care from Dr Raford. 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 Sodium at 120 on outpatient labs yesterday. Did have seizures before when her Na was 118. Awaiting labs here.  [RP]    Clinical Course User Index [RP] Yolande Lamar BROCKS, MD   Medical Decision Making Amount and/or Complexity of Data Reviewed Labs: ordered.  Risk Decision regarding hospitalization.   Lab work returned and showed that her sodium was 121.  Discussed with hospitalist for admission.       Yolande Lamar BROCKS, MD 11/07/23 2009

## 2023-11-07 NOTE — Plan of Care (Signed)

## 2023-11-07 NOTE — ED Provider Notes (Signed)
 [place] EMERGENCY DEPARTMENT AT [facility] Provider Note   CSN: [id number] Arrival date & time: 11/07/23  0531     Patient presents with: Hyponatremia   Cindy [name] is a 63 y.o. female.   The history is provided by the patient.  She has history of hypertension, hyperlipidemia, seizures, stroke, hyponatremia and was called by in outpatient laboratory because her sodium was 120.  She states that 1 week ago if she thinks it was 125.  She had to be admitted to the hospital 2 months ago because of hyponatremia and seizures.  She states she feels like she is at her baseline.   Prior to Admission medications   Medication Sig Start Date End Date Taking? Authorizing Provider  acetaminophen  (TYLENOL ) 325 MG tablet Take 2 tablets (650 mg total) by mouth every 6 (six) hours as needed (pain). 09/21/20   Angiulli, Toribio PARAS, PA-C  aspirin  325 MG tablet Take 1 tablet (325 mg total) by mouth daily. 09/21/20   Angiulli, Toribio PARAS, PA-C  atorvastatin  (LIPITOR) 40 MG tablet Take 1 tablet (40 mg total) by mouth at bedtime. 10/26/20   Angiulli, Toribio PARAS, PA-C  diazePAM , 15 MG Dose, (VALTOCO  15 MG DOSE) 2 x 7.5 MG/0.1ML LQPK Place 7.5 mg into the nose once as needed for up to 1 dose (valtoco  ( 7.5 mg in each nostril) for seizure lasting more than 2 minutes.). Patient not taking: Reported on 09/15/2023 08/22/23   Maree Bracken D, DO  divalproex  (DEPAKOTE ) 500 MG DR tablet Take 1 tablet (500 mg total) by mouth 2 (two) times daily. 11/23/22   Danford, Lonni SQUIBB, MD  docusate sodium  (COLACE) 50 MG capsule Take 50 mg by mouth daily.    [provider]  folic acid  (FOLVITE ) 1 MG tablet Take 1 tablet (1 mg total) by mouth daily. 10/26/20   Angiulli, Toribio PARAS, PA-C  lamoTRIgine  (LAMICTAL ) 25 MG tablet Take 75 mg by mouth 2 (two) times daily. 08/04/23   [provider]  levothyroxine (SYNTHROID) 25 MCG tablet Take 25 mcg by mouth every morning. 08/04/23   [provider]  lisinopril   (ZESTRIL ) 5 MG tablet Take 2.5 mg by mouth daily. 11/06/22   [provider]  melatonin 3 MG TABS tablet Take 1 tablet (3 mg total) by mouth at bedtime. 10/26/20   Angiulli, Toribio PARAS, PA-C  metFORMIN (GLUCOPHAGE) 500 MG tablet Take 500 mg by mouth daily. 08/05/23   [provider]  omeprazole (PRILOSEC) 40 MG capsule Take 40 mg by mouth every morning. Patient not taking: Reported on 09/15/2023 07/20/23   [provider]    Allergies: Patient has no known allergies.    Review of Systems  All other systems reviewed and are negative.   Updated Vital Signs BP 136/72   Pulse 76   Temp 98 F (36.7 C)   Resp 14   Ht 5' 7 (1.702 m)   Wt 78.9 kg   SpO2 98%   BMI 27.25 kg/m   Physical Exam Vitals and nursing note reviewed.   62 year old female, resting comfortably and in no acute distress. Vital signs are normal. Oxygen saturation is 98%, which is normal. Head is normocephalic and atraumatic. PERRLA, EOMI.  Lungs are clear without rales, wheezes, or rhonchi. Chest is nontender. Heart has regular rate and rhythm without murmur. Abdomen is soft, flat, nontender. Extremities have no cyanosis or edema. Skin is warm and dry without rash. Neurologic: Mental status is normal, cranial nerves are  intact, moves all extremities equally.  (all labs ordered are listed, but only abnormal results are displayed) Labs Reviewed  CBC WITH DIFFERENTIAL/PLATELET - Abnormal; Notable for the following components:      Result Value   RBC 3.32 (*)    Hemoglobin 10.9 (*)    HCT 31.2 (*)    Abs Immature Granulocytes 0.08 (*)    All other components within normal limits  COMPREHENSIVE METABOLIC PANEL WITH GFR    EKG: EKG Interpretation Date/Time:  Saturday November 07 2023 05:53:53 EDT Ventricular Rate:  78 PR Interval:  164 QRS Duration:  103 QT Interval:  384 QTC Calculation: 438 R Axis:   5  Text Interpretation: Sinus rhythm Low voltage, precordial leads Abnormal R-wave  progression, early transition When compared with ECG of 08/19/2023, No significant change was found Confirmed by Raford Lenis ([id number]) on 11/07/2023 6:02:52 AM   Procedures  Cardiac monitor shows normal sinus rhythm, per my interpretation. Medications Ordered in the ED - No data to display  Clinical Course as of 11/07/23 0724  Sat Nov 07, 2023  9278 Assumed care from Dr Raford. 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 Sodium at 120 on outpatient labs yesterday. Did have seizures before when her Na was 118. Awaiting labs here.  [RP]    Clinical Course User Index [RP] Yolande Lamar BROCKS, MD                                 Medical Decision Making Amount and/or Complexity of Data Reviewed Labs: ordered.   Report of hyponatremia.  However, I am unable to access the lab report which prompted her to come to the emergency department.  I have reviewed her past records and do note hospitalization 08/19/2023-/09/2023 for hyponatremia and seizure.  Last recorded sodium level was 128 on 4/5, lowest sodium was on admission at 118.  I have reviewed her CBC and my interpretation is stable anemia.  Comprehensive metabolic panel is pending.  Case is signed out to Dr. Yolande.     Final diagnoses:  Hyponatremia  Normochromic normocytic anemia    ED Discharge Orders     None          Raford Lenis, MD 11/07/23 [phone]

## 2023-11-07 NOTE — H&P (Signed)
 History and Physical    Patient: Cindy [name] FMW:[id number] DOB: 03-16-1961 DOA: 11/07/2023 DOS: the patient was seen and examined on 11/07/2023 PCP: Alston Silvio BROCKS, FNP   Patient coming from: Home  Chief Complaint:  Chief Complaint  Patient presents with   Hyponatremia   HPI: Cindy [name] is a 63 y.o. 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  Patient reports having regular blood work by PCP as an outpatient and found with a sodium level of 118 and recommendations to go to emergency department for further evaluation and management. Patient is currently asymptomatic otherwise.  In the ED repeat blood work demonstrating sodium of 121.  Previous hospitalization secondary to hyponatremia and severe associated seizure disorder.  Patient's daughter at bedside reported no compliance with orders regarding fluid restrictions previously given and no longer using daily sodium tablets.  TRH has been consulted to place patient in the hospital for further evaluation and management.  Review of Systems: As mentioned in the history of present illness. All other systems reviewed and are negative. Past Medical History:  Diagnosis Date   Abnormal LFTs    Hx of   CVA (cerebral vascular accident) (HCC) 2005   Hyperlipidemia    Hypertension    Polysubstance dependence including opioid type drug, episodic abuse (HCC)    cocaine/etoh    Seizures (HCC)    Past Surgical History:  Procedure Laterality Date   ESOPHAGOGASTRODUODENOSCOPY N/A 08/16/2019   Procedure: ESOPHAGOGASTRODUODENOSCOPY (EGD);  Surgeon: Rosalie Kitchens, MD;  Location: [facility] ENDOSCOPY;  Service: Endoscopy;  Laterality: N/A;   Social History:  reports that she has never smoked. She has never used smokeless tobacco. She reports that she does not currently use alcohol. She  reports that she does not currently use drugs after having used the following drugs: Cocaine.  No Known Allergies  Family History  Problem Relation Age of Onset   Cancer Father        prostate    Hypertension Mother    Asthma Mother    Bronchitis Mother     Prior to Admission medications   Medication Sig Start Date End Date Taking? Authorizing Provider  acetaminophen  (TYLENOL ) 325 MG tablet Take 2 tablets (650 mg total) by mouth every 6 (six) hours as needed (pain). 09/21/20   Angiulli, Toribio PARAS, PA-C  aspirin  325 MG tablet Take 1 tablet (325 mg total) by mouth daily. 09/21/20   Angiulli, Toribio PARAS, PA-C  atorvastatin  (LIPITOR) 40 MG tablet Take 1 tablet (40 mg total) by mouth at bedtime. 10/26/20   Angiulli, Toribio PARAS, PA-C  diazePAM , 15 MG Dose, (VALTOCO  15 MG DOSE) 2 x 7.5 MG/0.1ML LQPK Place 7.5 mg into the nose once as needed for up to 1 dose (valtoco  ( 7.5 mg in each nostril) for seizure lasting more than 2 minutes.). Patient not taking: Reported on 09/15/2023 08/22/23   Maree Bracken D, DO  divalproex  (DEPAKOTE ) 500 MG DR tablet Take 1 tablet (500 mg total) by mouth 2 (two) times daily. 11/23/22   Danford, Lonni SQUIBB, MD  docusate sodium  (COLACE) 50 MG capsule Take 50 mg by mouth daily.    [provider]  folic acid  (FOLVITE ) 1 MG tablet Take 1 tablet (1 mg total) by mouth daily. 10/26/20   Angiulli, Toribio PARAS, PA-C  lamoTRIgine  (LAMICTAL ) 25 MG tablet Take 75 mg by mouth 2 (two) times  daily. 08/04/23   [provider]  levothyroxine (SYNTHROID) 25 MCG tablet Take 25 mcg by mouth every morning. 08/04/23   [provider]  lisinopril  (ZESTRIL ) 5 MG tablet Take 2.5 mg by mouth daily. 11/06/22   [provider]  melatonin 3 MG TABS tablet Take 1 tablet (3 mg total) by mouth at bedtime. 10/26/20   Angiulli, Toribio PARAS, PA-C  metFORMIN (GLUCOPHAGE) 500 MG tablet Take 500 mg by mouth daily. 08/05/23   [provider]  omeprazole (PRILOSEC) 40 MG capsule Take 40  mg by mouth every morning. Patient not taking: Reported on 09/15/2023 07/20/23   [provider]    Physical Exam: Vitals:   11/07/23 0551 11/07/23 0553 11/07/23 0600 11/07/23 0615  BP: 135/73 135/73 134/71 136/72  Pulse: 77 76 77 76  Resp: 18 18 15 14   Temp:  98 F (36.7 C)    TempSrc: Oral     SpO2: 98% 97% 98% 98%  Weight:      Height:       General exam: Alert, awake, following commands appropriately and in not major distress. Respiratory system: Good air movement bilaterally; no using accessory muscle.  Good saturation on room air. Cardiovascular system:RRR. No rubs or gallops; no JVD. Gastrointestinal system: Abdomen is nondistended, soft and nontender.  Positive bowel sounds. Central nervous system:  No new focal neurological deficits (chronically wheelchair-bound at baseline). Extremities: No cyanosis or clubbing. Skin: No petechiae. Psychiatry: Flat affect appreciated on exam.  Data Reviewed: Comprehensive metabolic panel: Sodium 121, potassium 4.7, chloride 88, bicarb 24, BUN 15, creatinine 1.22, normal LFTs and GFR 50 CBC: WBCs 8.3, hemoglobin 10.9 and platelet count 362K Magnesium : 1.8 A1c:6.1  Assessment and Plan: 1-hyponatremia - Continue closely monitoring sodium level - Will follow fluid restriction and sodium tablets - Patient with underlying history of SIADH most likely triggered by antiepileptic drugs.  2-seizure disorder - Continue seizure precautions - Resume home antiepileptic drugs - Continue patient follow-up with neurology service.  3-chronic kidney disease stage IIIa - Appears to be at baseline - Continue minimizing nephrotoxic agent - Follow renal function trend.  4-history of prior CVA - Continue aspirin  and statin for secondary prevention - No new focal deficits.  5-CODE STATUS/ethics social - Discussed and confirmed with sister at bedside - Wishes respected and granted - Patient is DNR/DNI.    Advance Care Planning:   Code  Status: Limited: Do not attempt resuscitation (DNR) -DNR-LIMITED -Do Not Intubate/DNI    Consults: None  Family Communication: Sister at bedside  Severity of Illness: The appropriate patient status for this patient is OBSERVATION. Observation status is judged to be reasonable and necessary in order to provide the required intensity of service to ensure the patient's safety. The patient's presenting symptoms, physical exam findings, and initial radiographic and laboratory data in the context of their medical condition is felt to place them at decreased risk for further clinical deterioration. Furthermore, it is anticipated that the patient will be medically stable for discharge from the hospital within 2 midnights of admission.   Author: Eric Nunnery, MD 11/07/2023 9:34 AM  For on call review [email or web address].

## 2023-11-07 NOTE — Plan of Care (Signed)
   Problem: Activity: Goal: Risk for activity intolerance will decrease Outcome: Progressing   Problem: Coping: Goal: Level of anxiety will decrease Outcome: Progressing   Problem: Safety: Goal: Ability to remain free from injury will improve Outcome: Progressing

## 2023-11-07 NOTE — ED Triage Notes (Signed)
 Pt brought in by sister after they were notified by [facility] this morning around 4am that her Sodium was 120 and that she needed to come to ED d/t being a high risk for seizures. Pt's sister reports that this happened back in April.

## 2023-11-08 DIAGNOSIS — E871 Hypo-osmolality and hyponatremia: Secondary | ICD-10-CM | POA: Diagnosis not present

## 2023-11-08 LAB — BASIC METABOLIC PANEL WITH GFR
Anion gap: 9 (ref 5–15)
BUN: 19 mg/dL (ref 8–23)
CO2: 21 mmol/L — ABNORMAL LOW (ref 22–32)
Calcium: 8.7 mg/dL — ABNORMAL LOW (ref 8.9–10.3)
Chloride: 91 mmol/L — ABNORMAL LOW (ref 98–111)
Creatinine, Ser: 1.07 mg/dL — ABNORMAL HIGH (ref 0.44–1.00)
GFR, Estimated: 58 mL/min — ABNORMAL LOW (ref >60)
Glucose, Bld: 89 mg/dL (ref 70–99)
Potassium: 4.8 mmol/L (ref 3.5–5.1)
Sodium: 121 mmol/L — ABNORMAL LOW (ref 135–145)

## 2023-11-08 LAB — GLUCOSE, CAPILLARY
Glucose-Capillary: 97 mg/dL (ref 70–99)
Glucose-Capillary: 128 mg/dL — ABNORMAL HIGH (ref 70–99)

## 2023-11-08 MED ORDER — PANTOPRAZOLE SODIUM 40 MG PO TBEC
40.0000 mg | DELAYED_RELEASE_TABLET | Freq: Every day | ORAL | 1 refills | Status: DC
Start: 2023-11-09 — End: 2023-12-23

## 2023-11-08 MED ORDER — SODIUM CHLORIDE 1 G PO TABS: 1.0000 g | ORAL_TABLET | Freq: Every day | ORAL | 0 refills | Status: AC

## 2023-11-08 NOTE — Plan of Care (Signed)

## 2023-11-08 NOTE — Discharge Summary (Signed)
 Physician Discharge Summary   Patient: Cindy [name] MRN: [id number] DOB: Oct 08, 1960  Admit date:     11/07/2023  Discharge date: 11/08/23  Discharge Physician: Eric Nunnery   PCP: Alston Silvio BROCKS, FNP   Recommendations at discharge:  Repeat basic metabolic panel to follow electrolytes and renal function if stability. Reassess blood pressure and further adjust antihypertensive regimen as needed.  Discharge Diagnoses: Principal Problem:   Hyponatremia Chronic kidney disease stage IIIa History of prior CVA Seizure disorder Hypertension  Brief Hospital admission narrative: Cindy [name] is a 63 y.o. 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  Patient reports having regular blood work by PCP as an outpatient and found with a sodium level of 118 and recommendations to go to emergency department for further evaluation and management. Patient is currently asymptomatic otherwise.  In the ED repeat blood work demonstrating sodium of 121.  Previous hospitalization secondary to hyponatremia and severe associated seizure disorder.   Patient's daughter at bedside reported no compliance with orders regarding fluid restrictions previously given and no longer using daily sodium tablets.   TRH has been consulted to place patient in the hospital for further evaluation and management.  Assessment and Plan: 1-hyponatremia - Chronic in nature and most likely associated with SIADH driven by antiepileptic drugs (Depakote ). - Continue fluid restrictions and sodium tablet supplementation - Outpatient follow-up with neurology for potential change on this medication to most likely the use of Keppra  - Hemodynamically stable, no seizure activity appreciated and feeling ready to go home. - Outpatient follow-up with PCP recommended to continue  following serum level stability.  2-history of CVA - Continue aspirin  and statin for secondary prevention - No new focal deficit appreciated.  3-hypertension - Continue home antihypertensive agents - Continue to follow blood pressure fluctuation and adjust medication as needed.  4-chronic kidney disease stage IIIa - Continue patient follow-up with nephrology service - Renal function appears to be stable at baseline currently - Continue minimizing nephrotoxic agents.  5-history of seizure disorder - No seizure activity appreciated throughout hospitalization - Resume outpatient regimen of Lamictal  and Depakote  - Outpatient follow-up with neurology recommended.   Consultants: None Procedures performed: See below for x-ray report. Disposition: Home Diet recommendation: Soft diet with thin liquids.  DISCHARGE MEDICATION: Allergies as of 11/08/2023   No Known Allergies      Medication List     STOP taking these medications    diazePAM  (15 MG Dose) 2 x 7.5 MG/0.1ML Lqpk Commonly known as: Valtoco  15 MG Dose       TAKE these medications    acetaminophen  325 MG tablet Commonly known as: TYLENOL  Take 2 tablets (650 mg total) by mouth every 6 (six) hours as needed (pain).   aspirin  325 MG tablet Take 1 tablet (325 mg total) by mouth daily.   atorvastatin  40 MG tablet Commonly known as: LIPITOR Take 1 tablet (40 mg total) by mouth at bedtime.   divalproex  500 MG DR tablet Commonly known as: DEPAKOTE  Take 1 tablet (500 mg total) by mouth 2 (two) times daily.   docusate sodium  50 MG capsule Commonly known as: COLACE Take 50 mg by mouth daily.   folic acid  1 MG tablet Commonly known as: FOLVITE  Take 1 tablet (1 mg total) by mouth daily.   lamoTRIgine  25 MG tablet Commonly known as: LAMICTAL  Take 75 mg by mouth 2 (  two) times daily.   levothyroxine 25 MCG tablet Commonly known as: SYNTHROID Take 25 mcg by mouth every morning.   lisinopril  5 MG  tablet Commonly known as: ZESTRIL  Take 5 mg by mouth daily.   melatonin 3 MG Tabs tablet Take 1 tablet (3 mg total) by mouth at bedtime.   metFORMIN 500 MG tablet Commonly known as: GLUCOPHAGE Take 500 mg by mouth daily.   pantoprazole  40 MG tablet Commonly known as: PROTONIX  Take 1 tablet (40 mg total) by mouth daily. Start taking on: November 09, 2023   sodium chloride  1 g tablet Take 1 tablet (1 g total) by mouth daily.        Follow-up Information     Bucio, Silvio BROCKS, FNP. Schedule an appointment as soon as possible for a visit in 10 day(s).   Specialty: Family Medicine Contact information: [address] [place] [address] [phone]                Discharge Exam: Cindy [name]   11/07/23 0549 11/07/23 1048  Weight: 78.9 kg 70.1 kg   General exam: Afebrile, following commands appropriately and in no acute distress. Respiratory system: Clear to auscultation. Respiratory effort normal.  Good saturation on room air. Cardiovascular system:RRR.  No rubs or gallops. Gastrointestinal system: Abdomen is nondistended, soft and nontender. No organomegaly or masses felt. Normal bowel sounds heard. Central nervous system: No new focal neurological deficits. Extremities: No cyanosis or clubbing. Skin: No petechiae. Psychiatry: Flat affect appreciated on exam.   Condition at discharge: Stable and improved.  The results of significant diagnostics from this hospitalization (including imaging, microbiology, ancillary and laboratory) are listed below for reference.   Imaging Studies: No results found.  Microbiology: Results for orders placed or performed during the hospital encounter of 08/19/23  MRSA Next Gen by PCR, Nasal     Status: None   Collection Time: 08/19/23 11:31 AM   Specimen: Nasal Mucosa; Nasal Swab  Result Value Ref Range Status   MRSA by PCR Next Gen NOT DETECTED NOT DETECTED Final    Comment: (NOTE) The GeneXpert MRSA Assay (FDA approved for NASAL  specimens only), is one component of a comprehensive MRSA colonization surveillance program. It is not intended to diagnose MRSA infection nor to guide or monitor treatment for MRSA infections. Test performance is not FDA approved in patients less than 37 years old. Performed at [facility], [address], [place], [place] [address]     Labs: CBC: Recent Labs  Lab 11/07/23 0555  WBC 8.3  NEUTROABS 5.0  HGB 10.9*  HCT 31.2*  MCV 94.0  PLT 362   Basic Metabolic Panel: Recent Labs  Lab 11/07/23 0648 11/07/23 0932 11/08/23 0311  NA 121*  --  121*  K 4.7  --  4.8  CL 88*  --  91*  CO2 24  --  21*  GLUCOSE 101*  --  89  BUN 15  --  19  CREATININE 1.22*  --  1.07*  CALCIUM  9.2  --  8.7*  MG  --  1.8  --    Liver Function Tests: Recent Labs  Lab 11/07/23 0648  AST 19  ALT 13  ALKPHOS 84  BILITOT 0.5  PROT 7.0  ALBUMIN  3.3*   CBG: Recent Labs  Lab 11/07/23 1113 11/07/23 1636 11/07/23 2020 11/08/23 0728 11/08/23 1124  GLUCAP 91 87 137* 97 128*    Discharge time spent: greater than 30 minutes.  Signed: Eric Nunnery, MD  [facility] 11/08/2023

## 2023-11-16 ENCOUNTER — Ambulatory Visit (HOSPITAL_COMMUNITY): Payer: MEDICAID | Attending: Family Medicine | Admitting: Speech Pathology

## 2023-11-16 ENCOUNTER — Encounter (HOSPITAL_COMMUNITY): Payer: Self-pay | Admitting: Speech Pathology

## 2023-11-16 ENCOUNTER — Ambulatory Visit (HOSPITAL_COMMUNITY)
Admission: RE | Admit: 2023-11-16 | Discharge: 2023-11-16 | Disposition: A | Discharge status: Home / Self Care | Source: Ambulatory Visit | Attending: Family Medicine | Admitting: Family Medicine

## 2023-11-16 ENCOUNTER — Other Ambulatory Visit: Payer: Self-pay

## 2023-11-16 DIAGNOSIS — R059 Cough, unspecified: Secondary | ICD-10-CM | POA: Diagnosis present

## 2023-11-16 DIAGNOSIS — R1312 Dysphagia, oropharyngeal phase: Secondary | ICD-10-CM | POA: Insufficient documentation

## 2023-11-16 NOTE — Therapy (Signed)
 Modified Barium Swallow Study  Patient Details  Name: Cindy [name] MRN: [id number] Date of Birth: [birth year]  Today's Date: 11/16/2023  HPI/PMH: HPI: Cindy [name] is a 63 yo female who was referred for MBSS by Silvio Ramp, FNP, due to increased couging after meals. Pt had MBSS 08/12/19 with recommendation for puree/NTL. She typically consumes soft textures and thin liquids per caregiver (sister), but does best with puree. No recent bouts of PNA.   Clinical Impression: Clinical Impression: Pt presents with mild/mod oropharyngeal dysphagia characterized by reduced labial seal intermittently with liquids (cognitive deficits impact), reduced lingual control with posterior labial escape to pharynx, impaired mastication and reduced bolus cohesiveness with solids, swallow trigger after filling the pyriforms with thin, reduced laryngeal vestibule closure with trace, silent aspiration of thins when taking tsp presentation and trace, silent aspiration of solids. Pt with deep penetration of cup sips thin, but immediately expelled. Pt with prominent cricopharyngeus, but did not appear to impact bolus flow. The trace aspiration was difficult to see in real time, but was observed in slow motion with study review. Pt's sister reports that Pt coughs occasionally during meals and appears to have a difficult time initiating a swallow (appears effortful) and typically eats while watching television. Her sister does indicate some concern for impulsivity during intake and stated she will try to feed Pt in a quiet environment at a slow rate (she has her self feed as able). She also indicates that she seems to do best with puree food and seems to like them. Suspect that Pt does occasionally aspirate thins, saliva, and pieces of dry solids, which may induce some coughing during meals. Recommend soft solids vs puree (D3 and consider D1 meats) and avoid dry solids (also rice, nuts etc) and add moisture to solids and ok to continue  thin liquids via straw sips via slow rate and pills whole with thin. Pt to adhere to reflux and aspiration precautions with special attention to oral care to minimize bacteria load. Pt's sister is in agreement with plan of care and was given my contact information should she have any further questions. No further SLP services indicated at this time.   Factors that may increase risk of adverse event in presence of aspiration Noe & Lianne 2021): Factors that may increase risk of adverse event in presence of aspiration Noe & Lianne 2021): Reduced cognitive function   Recommendations/Plan: Swallowing Evaluation Recommendations Swallowing Evaluation Recommendations Recommendations: PO diet PO Diet Recommendation: Dysphagia 3 (Mechanical soft); Dysphagia 1 (Pureed); Thin liquids (Level 0) Liquid Administration via: Straw Medication Administration: Whole meds with liquid Supervision: Patient able to self-feed; Intermittent supervision/cueing for swallowing strategies Swallowing strategies  : Slow rate; Multiple dry swallows after each bite/sip Postural changes: Position pt fully upright for meals; Stay upright 30-60 min after meals Oral care recommendations: Oral care BID (2x/day); Staff/trained caregiver to provide oral care    Treatment Plan Treatment Plan Treatment recommendations: No treatment recommended at this time Follow-up recommendations: No SLP follow up     Recommendations Recommendations for follow up therapy are one component of a multi-disciplinary discharge planning process, led by the attending physician.  Recommendations may be updated based on patient status, additional functional criteria and insurance authorization.  Assessment: Orofacial Exam: Orofacial Exam Oral Cavity: Oral Hygiene: WFL Oral Cavity - Dentition: Dentures, top; Edentulous Orofacial Anatomy: WFL Oral Motor/Sensory Function: WFL    Anatomy:  Anatomy: WFL; Prominent  cricopharyngeus   Boluses Administered: Boluses Administered Boluses Administered: Thin liquids (Level 0);  Mildly thick liquids (Level 2, nectar thick); Puree; Solid     Oral Impairment Domain: Oral Impairment Domain Lip Closure: Escape from interlabial space or lateral juncture, no extension beyond vermillion border Tongue control during bolus hold: Posterior escape of less than half of bolus Bolus preparation/mastication: Disorganized chewing/mashing with solid pieces of bolus unchewed Bolus transport/lingual motion: Slow tongue motion Oral residue: Trace residue lining oral structures Location of oral residue : Tongue Initiation of pharyngeal swallow : Pyriform sinuses; Valleculae     Pharyngeal Impairment Domain: Pharyngeal Impairment Domain Soft palate elevation: No bolus between soft palate (SP)/pharyngeal wall (PW) Laryngeal elevation: Partial superior movement of thyroid cartilage/partial approximation of arytenoids to epiglottic petiole Anterior hyoid excursion: Complete anterior movement Epiglottic movement: Complete inversion Laryngeal vestibule closure: Incomplete, narrow column air/contrast in laryngeal vestibule Pharyngeal stripping wave : Present - complete Pharyngeal contraction (A/P view only): N/A Pharyngoesophageal segment opening: Partial distention/partial duration, partial obstruction of flow Tongue base retraction: Trace column of contrast or air between tongue base and PPW; Narrow column of contrast or air between tongue base and PPW Pharyngeal residue: Collection of residue within or on pharyngeal structures Location of pharyngeal residue: Valleculae; Pyriform sinuses     Esophageal Impairment Domain: Esophageal Impairment Domain Esophageal clearance upright position: Esophageal retention    Pill: Pill Consistency administered: Thin liquids (Level 0) Thin liquids (Level 0): [facility]    Penetration/Aspiration Scale Score: Penetration/Aspiration  Scale Score 1.  Material does not enter airway: Mildly thick liquids (Level 2, nectar thick); Puree; Pill 8.  Material enters airway, passes BELOW cords without attempt by patient to eject out (silent aspiration) : Thin liquids (Level 0); Solid    Compensatory Strategies: Compensatory Strategies Compensatory strategies: Yes Straw: Effective Effective Straw: Thin liquid (Level 0) Multiple swallows: Effective Effective Multiple Swallows: Thin liquid (Level 0); Solid       General Information: Caregiver present: Yes   Diet Prior to this Study: Dysphagia 3 (mechanical soft); Thin liquids (Level 0)    Temperature : Normal    Respiratory Status: WFL    Supplemental O2: None (Room air)    History of Recent Intubation: No   Behavior/Cognition: Alert; Cooperative; Pleasant mood; Requires cueing  Self-Feeding Abilities: Able to self-feed; Needs set-up for self-feeding  Baseline vocal quality/speech: Normal  Volitional Cough: Able to elicit  Volitional Swallow: Able to elicit  Exam Limitations: No limitations  Pain: Pain Assessment Pain Assessment: No/denies pain    End of Session: Start Time:No data recorded Stop Time: No data recorded Time Calculation:No data recorded Charges: No data recorded SLP visit diagnosis: SLP Visit Diagnosis: Dysphagia, oropharyngeal phase (R13.12)    Past Medical History:  Past Medical History:  Diagnosis Date   Abnormal LFTs    Hx of   CVA (cerebral vascular accident) (HCC) 2005   Hyperlipidemia    Hypertension    Polysubstance dependence including opioid type drug, episodic abuse (HCC)    cocaine/etoh    Seizures (HCC)    Past Surgical History:  Past Surgical History:  Procedure Laterality Date   ESOPHAGOGASTRODUODENOSCOPY N/A 08/16/2019   Procedure: ESOPHAGOGASTRODUODENOSCOPY (EGD);  Surgeon: Rosalie Kitchens, MD;  Location: [facility] ENDOSCOPY;  Service: Endoscopy;  Laterality: N/A;   Thank you,  Lamar Candy,  CCC-SLP [phone]  Azeez Dunker 11/16/2023, 5:42 PM

## 2023-12-08 ENCOUNTER — Ambulatory Visit: Admitting: Nutrition

## 2023-12-22 ENCOUNTER — Emergency Department (HOSPITAL_COMMUNITY)

## 2023-12-22 ENCOUNTER — Observation Stay (HOSPITAL_COMMUNITY)
Admission: EM | Admit: 2023-12-22 | Discharge: 2023-12-24 | Disposition: A | Discharge status: Home / Self Care | Attending: Emergency Medicine | Admitting: Emergency Medicine

## 2023-12-22 ENCOUNTER — Encounter (HOSPITAL_COMMUNITY): Payer: Self-pay | Admitting: Emergency Medicine

## 2023-12-22 ENCOUNTER — Observation Stay (HOSPITAL_COMMUNITY)

## 2023-12-22 ENCOUNTER — Other Ambulatory Visit: Payer: Self-pay

## 2023-12-22 DIAGNOSIS — R4182 Altered mental status, unspecified: Secondary | ICD-10-CM | POA: Diagnosis present

## 2023-12-22 DIAGNOSIS — Z7982 Long term (current) use of aspirin: Secondary | ICD-10-CM | POA: Diagnosis not present

## 2023-12-22 DIAGNOSIS — N179 Acute kidney failure, unspecified: Secondary | ICD-10-CM | POA: Insufficient documentation

## 2023-12-22 DIAGNOSIS — I129 Hypertensive chronic kidney disease with stage 1 through stage 4 chronic kidney disease, or unspecified chronic kidney disease: Secondary | ICD-10-CM | POA: Diagnosis not present

## 2023-12-22 DIAGNOSIS — D72829 Elevated white blood cell count, unspecified: Secondary | ICD-10-CM | POA: Insufficient documentation

## 2023-12-22 DIAGNOSIS — Z8673 Personal history of transient ischemic attack (TIA), and cerebral infarction without residual deficits: Secondary | ICD-10-CM | POA: Insufficient documentation

## 2023-12-22 DIAGNOSIS — G9340 Encephalopathy, unspecified: Principal | ICD-10-CM | POA: Insufficient documentation

## 2023-12-22 DIAGNOSIS — E669 Obesity, unspecified: Secondary | ICD-10-CM | POA: Insufficient documentation

## 2023-12-22 DIAGNOSIS — E039 Hypothyroidism, unspecified: Secondary | ICD-10-CM | POA: Diagnosis present

## 2023-12-22 DIAGNOSIS — R531 Weakness: Secondary | ICD-10-CM | POA: Diagnosis not present

## 2023-12-22 DIAGNOSIS — E871 Hypo-osmolality and hyponatremia: Secondary | ICD-10-CM | POA: Insufficient documentation

## 2023-12-22 DIAGNOSIS — R569 Unspecified convulsions: Secondary | ICD-10-CM | POA: Diagnosis not present

## 2023-12-22 DIAGNOSIS — I10 Essential (primary) hypertension: Secondary | ICD-10-CM | POA: Diagnosis present

## 2023-12-22 DIAGNOSIS — N1831 Chronic kidney disease, stage 3a: Secondary | ICD-10-CM | POA: Insufficient documentation

## 2023-12-22 LAB — URINALYSIS, ROUTINE W REFLEX MICROSCOPIC
Bilirubin Urine: NEGATIVE
Glucose, UA: NEGATIVE mg/dL
Ketones, ur: NEGATIVE mg/dL
Nitrite: NEGATIVE
Protein, ur: NEGATIVE mg/dL
Specific Gravity, Urine: 1.04 — ABNORMAL HIGH (ref 1.005–1.030)
pH: 6 (ref 5.0–8.0)

## 2023-12-22 LAB — I-STAT CHEM 8, ED
BUN: 19 mg/dL (ref 8–23)
Calcium, Ion: 1.17 mmol/L (ref 1.15–1.40)
Chloride: 97 mmol/L — ABNORMAL LOW (ref 98–111)
Creatinine, Ser: 1.7 mg/dL — ABNORMAL HIGH (ref 0.44–1.00)
Glucose, Bld: 101 mg/dL — ABNORMAL HIGH (ref 70–99)
HCT: 34 % — ABNORMAL LOW (ref 36.0–46.0)
Hemoglobin: 11.6 g/dL — ABNORMAL LOW (ref 12.0–15.0)
Potassium: 4.6 mmol/L (ref 3.5–5.1)
Sodium: 129 mmol/L — ABNORMAL LOW (ref 135–145)
TCO2: 22 mmol/L (ref 22–32)

## 2023-12-22 LAB — COMPREHENSIVE METABOLIC PANEL WITH GFR
ALT: 15 U/L (ref 0–44)
AST: 22 U/L (ref 15–41)
Albumin: 3.2 g/dL — ABNORMAL LOW (ref 3.5–5.0)
Alkaline Phosphatase: 83 U/L (ref 38–126)
Anion gap: 11 (ref 5–15)
BUN: 19 mg/dL (ref 8–23)
CO2: 22 mmol/L (ref 22–32)
Calcium: 8.6 mg/dL — ABNORMAL LOW (ref 8.9–10.3)
Chloride: 97 mmol/L — ABNORMAL LOW (ref 98–111)
Creatinine, Ser: 1.51 mg/dL — ABNORMAL HIGH (ref 0.44–1.00)
GFR, Estimated: 38 mL/min — ABNORMAL LOW (ref >60)
Glucose, Bld: 99 mg/dL (ref 70–99)
Potassium: 4.4 mmol/L (ref 3.5–5.1)
Sodium: 130 mmol/L — ABNORMAL LOW (ref 135–145)
Total Bilirubin: 0.6 mg/dL (ref 0.0–1.2)
Total Protein: 7.2 g/dL (ref 6.5–8.1)

## 2023-12-22 LAB — DIFFERENTIAL
Abs Immature Granulocytes: 0.13 K/uL — ABNORMAL HIGH (ref 0.00–0.07)
Basophils Absolute: 0 K/uL (ref 0.0–0.1)
Basophils Relative: 0 %
Eosinophils Absolute: 0 K/uL (ref 0.0–0.5)
Eosinophils Relative: 0 %
Immature Granulocytes: 1 %
Lymphocytes Relative: 11 %
Lymphs Abs: 1.2 K/uL (ref 0.7–4.0)
Monocytes Absolute: 1.7 K/uL — ABNORMAL HIGH (ref 0.1–1.0)
Monocytes Relative: 15 %
Neutro Abs: 8.3 K/uL — ABNORMAL HIGH (ref 1.7–7.7)
Neutrophils Relative %: 73 %

## 2023-12-22 LAB — CBC
HCT: 31.3 % — ABNORMAL LOW (ref 36.0–46.0)
Hemoglobin: 10.7 g/dL — ABNORMAL LOW (ref 12.0–15.0)
MCH: 32.8 pg (ref 26.0–34.0)
MCHC: 34.2 g/dL (ref 30.0–36.0)
MCV: 96 fL (ref 80.0–100.0)
Platelets: 229 K/uL (ref 150–400)
RBC: 3.26 MIL/uL — ABNORMAL LOW (ref 3.87–5.11)
RDW: 13.5 % (ref 11.5–15.5)
WBC: 11.3 K/uL — ABNORMAL HIGH (ref 4.0–10.5)
nRBC: 0 % (ref 0.0–0.2)

## 2023-12-22 LAB — VALPROIC ACID LEVEL: Valproic Acid Lvl: 73 ug/mL (ref 50–100)

## 2023-12-22 LAB — PROTIME-INR
INR: 1 (ref 0.8–1.2)
Prothrombin Time: 13.6 s (ref 11.4–15.2)

## 2023-12-22 LAB — RAPID URINE DRUG SCREEN, HOSP PERFORMED
Amphetamines: NOT DETECTED
Barbiturates: NOT DETECTED
Benzodiazepines: NOT DETECTED
Cocaine: NOT DETECTED
Opiates: NOT DETECTED
Tetrahydrocannabinol: NOT DETECTED

## 2023-12-22 LAB — ETHANOL: Alcohol, Ethyl (B): <15 mg/dL (ref <15)

## 2023-12-22 LAB — APTT: aPTT: 30 s (ref 24–36)

## 2023-12-22 LAB — AMMONIA: Ammonia: 19 umol/L (ref 9–35)

## 2023-12-22 LAB — CBG MONITORING, ED: Glucose-Capillary: 96 mg/dL (ref 70–99)

## 2023-12-22 MED ORDER — OLANZAPINE 5 MG PO TABS
2.5000 mg | ORAL_TABLET | Freq: Every day | ORAL | Status: DC
  Administered 2023-12-22 – 2023-12-23 (×2): 2.5 mg via ORAL
  Filled 2023-12-22 (×2): qty 1

## 2023-12-22 MED ORDER — ACETAMINOPHEN 160 MG/5ML PO SOLN: 650.0000 mg | ORAL | Status: DC | PRN

## 2023-12-22 MED ORDER — ATORVASTATIN CALCIUM 40 MG PO TABS
40.0000 mg | ORAL_TABLET | Freq: Every day | ORAL | Status: DC
  Administered 2023-12-22 – 2023-12-23 (×2): 40 mg via ORAL
  Filled 2023-12-22 (×2): qty 1

## 2023-12-22 MED ORDER — LAMOTRIGINE 25 MG PO TABS
75.0000 mg | ORAL_TABLET | Freq: Two times a day (BID) | ORAL | Status: DC
  Administered 2023-12-22 – 2023-12-24 (×4): 75 mg via ORAL
  Filled 2023-12-22 (×4): qty 3

## 2023-12-22 MED ORDER — DIVALPROEX SODIUM 250 MG PO DR TAB
500.0000 mg | DELAYED_RELEASE_TABLET | Freq: Two times a day (BID) | ORAL | Status: DC
  Administered 2023-12-22 – 2023-12-24 (×4): 500 mg via ORAL
  Filled 2023-12-22 (×4): qty 2

## 2023-12-22 MED ORDER — ENOXAPARIN SODIUM 40 MG/0.4ML IJ SOSY
40.0000 mg | PREFILLED_SYRINGE | INTRAMUSCULAR | Status: DC
  Administered 2023-12-22 – 2023-12-23 (×2): 40 mg via SUBCUTANEOUS
  Filled 2023-12-22 (×2): qty 0.4

## 2023-12-22 MED ORDER — LEVOTHYROXINE SODIUM 25 MCG PO TABS
25.0000 ug | ORAL_TABLET | Freq: Every morning | ORAL | Status: DC
Start: 2023-12-23 — End: 2023-12-24
  Administered 2023-12-23 – 2023-12-24 (×2): 25 ug via ORAL
  Filled 2023-12-22 (×2): qty 1

## 2023-12-22 MED ORDER — SENNOSIDES-DOCUSATE SODIUM 8.6-50 MG PO TABS
1.0000 | ORAL_TABLET | Freq: Every evening | ORAL | Status: DC | PRN
  Administered 2023-12-22: 1 via ORAL
  Filled 2023-12-22: qty 1

## 2023-12-22 MED ORDER — IOHEXOL 350 MG/ML SOLN
80.0000 mL | Freq: Once | INTRAVENOUS | Status: AC | PRN
  Administered 2023-12-22: 80 mL via INTRAVENOUS

## 2023-12-22 MED ORDER — ACETAMINOPHEN 650 MG RE SUPP: 650.0000 mg | RECTAL | Status: DC | PRN

## 2023-12-22 MED ORDER — ASPIRIN 325 MG PO TABS
325.0000 mg | ORAL_TABLET | Freq: Every day | ORAL | Status: DC
  Administered 2023-12-23 – 2023-12-24 (×2): 325 mg via ORAL
  Filled 2023-12-22 (×2): qty 1

## 2023-12-22 MED ORDER — SODIUM CHLORIDE 1 G PO TABS
1.0000 g | ORAL_TABLET | Freq: Every day | ORAL | Status: DC
  Administered 2023-12-23 – 2023-12-24 (×2): 1 g via ORAL
  Filled 2023-12-22 (×3): qty 1

## 2023-12-22 MED ORDER — ACETAMINOPHEN 325 MG PO TABS: 650.0000 mg | ORAL_TABLET | ORAL | Status: DC | PRN

## 2023-12-22 MED ORDER — LACTATED RINGERS IV BOLUS
1000.0000 mL | Freq: Once | INTRAVENOUS | Status: AC
  Administered 2023-12-22: 1000 mL via INTRAVENOUS

## 2023-12-22 MED ORDER — STROKE: EARLY STAGES OF RECOVERY BOOK
Freq: Once | Status: DC
  Filled 2023-12-22: qty 1

## 2023-12-22 MED ORDER — SODIUM CHLORIDE 0.9 % IV SOLN
INTRAVENOUS | Status: AC
  Administered 2023-12-22: 100 mL/h via INTRAVENOUS

## 2023-12-22 NOTE — ED Provider Notes (Signed)
 Pt signed out to me at shift change,  pt's sister now at bedside,  stating around 3:30 pm  today pt was noted to have a staring episode,  she became very pale,  her eyes dilated and her mouth was drawing to one side.  Not c/w seizure which is a full body tremor and loud verbal noises.   Pt appears to be back to her baseline now,  although she endorses feeling tired.     Has prior hx of hyponatremia,  seizure,  cva.  Code stoke initially called,  consult by neurology obtained,  Dr. Sterling. CTA head/neck negative for acute findings of perfusion deficits.    Advised admission for observation and  mri and eeg in am.   Labs additionally were reviewed, patient does have a hyponatremia at 130, however prior sodium level was 121, this was 1 month ago., she also has worsening kidney function with a creatinine of 1.7.  Hemoglobin is 10.7 which is stable.   Call placed to Dr. Charlton who accepts pt for admission.  Results for orders placed or performed during the hospital encounter of 12/22/23  POC CBG, ED   Collection Time: 12/22/23  6:05 PM  Result Value Ref Range   Glucose-Capillary 96 70 - 99 mg/dL  Ethanol   Collection Time: 12/22/23  6:17 PM  Result Value Ref Range   Alcohol, Ethyl (B) <15 <15 mg/dL  Protime-INR   Collection Time: 12/22/23  6:17 PM  Result Value Ref Range   Prothrombin Time 13.6 11.4 - 15.2 seconds   INR 1.0 0.8 - 1.2  APTT   Collection Time: 12/22/23  6:17 PM  Result Value Ref Range   aPTT 30 24 - 36 seconds  CBC   Collection Time: 12/22/23  6:17 PM  Result Value Ref Range   WBC 11.3 (H) 4.0 - 10.5 K/uL   RBC 3.26 (L) 3.87 - 5.11 MIL/uL   Hemoglobin 10.7 (L) 12.0 - 15.0 g/dL   HCT 68.6 (L) 63.9 - 53.9 %   MCV 96.0 80.0 - 100.0 fL   MCH 32.8 26.0 - 34.0 pg   MCHC 34.2 30.0 - 36.0 g/dL   RDW 86.4 88.4 - 84.4 %   Platelets 229 150 - 400 K/uL   nRBC 0.0 0.0 - 0.2 %  Differential   Collection Time: 12/22/23  6:17 PM  Result Value Ref Range   Neutrophils Relative % 73  %   Neutro Abs 8.3 (H) 1.7 - 7.7 K/uL   Lymphocytes Relative 11 %   Lymphs Abs 1.2 0.7 - 4.0 K/uL   Monocytes Relative 15 %   Monocytes Absolute 1.7 (H) 0.1 - 1.0 K/uL   Eosinophils Relative 0 %   Eosinophils Absolute 0.0 0.0 - 0.5 K/uL   Basophils Relative 0 %   Basophils Absolute 0.0 0.0 - 0.1 K/uL   Immature Granulocytes 1 %   Abs Immature Granulocytes 0.13 (H) 0.00 - 0.07 K/uL  Comprehensive metabolic panel   Collection Time: 12/22/23  6:17 PM  Result Value Ref Range   Sodium 130 (L) 135 - 145 mmol/L   Potassium 4.4 3.5 - 5.1 mmol/L   Chloride 97 (L) 98 - 111 mmol/L   CO2 22 22 - 32 mmol/L   Glucose, Bld 99 70 - 99 mg/dL   BUN 19 8 - 23 mg/dL   Creatinine, Ser 8.48 (H) 0.44 - 1.00 mg/dL   Calcium  8.6 (L) 8.9 - 10.3 mg/dL   Total Protein 7.2 6.5 - 8.1  g/dL   Albumin  3.2 (L) 3.5 - 5.0 g/dL   AST 22 15 - 41 U/L   ALT 15 0 - 44 U/L   Alkaline Phosphatase 83 38 - 126 U/L   Total Bilirubin 0.6 0.0 - 1.2 mg/dL   GFR, Estimated 38 (L) >60 mL/min   Anion gap 11 5 - 15  Valproic  acid level   Collection Time: 12/22/23  6:17 PM  Result Value Ref Range   Valproic  Acid Lvl 73 50 - 100 ug/mL  I-stat chem 8, ED (not at [facility], [facility] or [facility])   Collection Time: 12/22/23  6:25 PM  Result Value Ref Range   Sodium 129 (L) 135 - 145 mmol/L   Potassium 4.6 3.5 - 5.1 mmol/L   Chloride 97 (L) 98 - 111 mmol/L   BUN 19 8 - 23 mg/dL   Creatinine, Ser 8.29 (H) 0.44 - 1.00 mg/dL   Glucose, Bld 898 (H) 70 - 99 mg/dL   Calcium , Ion 1.17 1.15 - 1.40 mmol/L   TCO2 22 22 - 32 mmol/L   Hemoglobin 11.6 (L) 12.0 - 15.0 g/dL   HCT 65.9 (L) 63.9 - 53.9 %  Urine rapid drug screen (hosp performed)   Collection Time: 12/22/23  8:34 PM  Result Value Ref Range   Opiates NONE DETECTED NONE DETECTED   Cocaine NONE DETECTED NONE DETECTED   Benzodiazepines NONE DETECTED NONE DETECTED   Amphetamines NONE DETECTED NONE DETECTED   Tetrahydrocannabinol NONE DETECTED NONE DETECTED   Barbiturates NONE DETECTED NONE  DETECTED   CT ANGIO HEAD NECK W WO CM W PERF (CODE STROKE) Result Date: 12/22/2023 CLINICAL DATA:  Neuro deficit, acute, stroke suspected EXAM: CT ANGIOGRAPHY HEAD AND NECK CT PERFUSION BRAIN TECHNIQUE: Multidetector CT imaging of the head and neck was performed using the standard protocol during bolus administration of intravenous contrast. Multiplanar CT image reconstructions and MIPs were obtained to evaluate the vascular anatomy. Carotid stenosis measurements (when applicable) are obtained utilizing NASCET criteria, using the distal internal carotid diameter as the denominator. Multiphase CT imaging of the brain was performed following IV bolus contrast injection. Subsequent parametric perfusion maps were calculated using RAPID software. RADIATION DOSE REDUCTION: This exam was performed according to the departmental dose-optimization program which includes automated exposure control, adjustment of the mA and/or kV according to patient size and/or use of iterative reconstruction technique. CONTRAST:  80mL OMNIPAQUE  IOHEXOL  350 MG/ML SOLN COMPARISON:  None Available. FINDINGS: CTA NECK FINDINGS Aortic arch: Great vessel origins are patent no significant stenosis. Right carotid system: No evidence of dissection, stenosis (50% or greater) or occlusion. Left carotid system: No evidence of dissection, stenosis (50% or greater) or occlusion. Vertebral arteries: Codominant. No evidence of dissection, stenosis (50% or greater) or occlusion. Skeleton: Other neck: No acute abnormality on limited assessment. Upper chest: Lung apices are clear. Review of the MIP images confirms the above findings CTA HEAD FINDINGS Anterior circulation: Bilateral intracranial ICAs, MCAs and ACAs are patent without proximal hemodynamically significant stenosis. Azygous ACA, anatomic variant. Posterior circulation: Bilateral intradural vertebral arteries, basilar artery and bilateral posterior cerebral artery are patent. Moderate bilateral  intradural vertebral artery stenosis. Venous sinuses: As permitted by contrast timing, patent. Review of the MIP images confirms the above findings CT Brain Perfusion Findings: ASPECTS: 10 CBF (<30%) Volume: 0mL Perfusion (Tmax>6.0s) volume: 0mL Mismatch Volume: 0mL Infarction Location:None identified. IMPRESSION: No emergent large vessel occlusion or proximal a hemodynamically significant stenosis. Imaging results were communicated on 12/22/2023 at 7:57 pm to provider Dr.Dickson via  telephone Electronically Signed   By: Gilmore GORMAN Molt M.D.   On: 12/22/2023 20:00   CT HEAD CODE STROKE WO CONTRAST Result Date: 12/22/2023 CLINICAL DATA:  Code stroke.  Neuro deficit, acute, stroke suspected EXAM: CT HEAD WITHOUT CONTRAST TECHNIQUE: Contiguous axial images were obtained from the base of the skull through the vertex without intravenous contrast. RADIATION DOSE REDUCTION: This exam was performed according to the departmental dose-optimization program which includes automated exposure control, adjustment of the mA and/or kV according to patient size and/or use of iterative reconstruction technique. COMPARISON:  CT head August 19, 2023. FINDINGS: Brain: No evidence of acute infarction, hemorrhage, hydrocephalus, extra-axial collection or mass lesion/mass effect. Small remote infarct in the posterior limb of the right internal capsule. Patchy white matter hypodensities are compatible with chronic microvascular ischemic disease. Cerebral atrophy. Vascular: Calcific atherosclerosis. No hyperdense vessel identified. Skull: No acute fracture. Sinuses/Orbits: Clear sinuses.  No acute orbital findings. Other: No mastoid effusions. ASPECTS [facility] Stroke Program Early CT Score) Total score (0-10 with 10 being normal): Sent. IMPRESSION: No evidence of acute intracranial abnormality.  ASPECTS 10. Electronically Signed   By: Gilmore GORMAN Molt M.D.   On: 12/22/2023 19:51      Shem Plemmons, PA-C 12/22/23 2115    Melvenia Motto,  MD 12/22/23 [phone]

## 2023-12-22 NOTE — ED Provider Notes (Addendum)
 [place] EMERGENCY DEPARTMENT AT [facility] Provider Note   CSN: [id number] Arrival date & time: 12/22/23  1742     Patient presents with: Altered Mental Status   Cindy [name] is a 63 y.o. female with history of seizure, hyponatremia, CVA presents to the emerged from today for evaluation of questionable altered mental status.  Family is not at bedside so history is limited.  Patient does have difficulty with speaking, seems to possibly be around baseline but unknown.  Level 5 caveat because of this.  Per EMS, last known well was around 4 to 4-1/2 hours ago.  Reports that she was different than usual.  There was no other further clarification on this.  Unfortunately, I have called patient to members as well as charge nurse multiple times without any answering.  HIPAA compliant voicemail was left.   Altered Mental Status      Prior to Admission medications   Medication Sig Start Date End Date Taking? Authorizing Provider  Melatonin 3 MG SUBL Take 1 tablet by mouth at bedtime. 12/16/23  Yes [provider]  acetaminophen  (TYLENOL ) 325 MG tablet Take 2 tablets (650 mg total) by mouth every 6 (six) hours as needed (pain). 09/21/20   Angiulli, Toribio PARAS, PA-C  aspirin  325 MG tablet Take 1 tablet (325 mg total) by mouth daily. 09/21/20   Angiulli, Toribio PARAS, PA-C  atorvastatin  (LIPITOR) 40 MG tablet Take 1 tablet (40 mg total) by mouth at bedtime. 10/26/20   Angiulli, Toribio PARAS, PA-C  divalproex  (DEPAKOTE ) 500 MG DR tablet Take 1 tablet (500 mg total) by mouth 2 (two) times daily. 11/23/22   Danford, Lonni SQUIBB, MD  docusate sodium  (COLACE) 50 MG capsule Take 50 mg by mouth daily.    [provider]  folic acid  (FOLVITE ) 1 MG tablet Take 1 tablet (1 mg total) by mouth daily. 10/26/20   Angiulli, Toribio PARAS, PA-C  lamoTRIgine  (LAMICTAL ) 25 MG tablet Take 75 mg by mouth 2 (two) times daily. 08/04/23   [provider]  levothyroxine  (SYNTHROID ) 25 MCG tablet Take 25  mcg by mouth every morning. 08/04/23   [provider]  lisinopril  (ZESTRIL ) 5 MG tablet Take 5 mg by mouth daily. 11/06/22   [provider]  metFORMIN (GLUCOPHAGE) 500 MG tablet Take 500 mg by mouth daily. 08/05/23   [provider]  OLANZapine  (ZYPREXA ) 2.5 MG tablet Take 2.5 mg by mouth at bedtime. 12/15/23   [provider]  ondansetron  (ZOFRAN -ODT) 4 MG disintegrating tablet Take 4 mg by mouth every 8 (eight) hours as needed.    [provider]  pantoprazole  (PROTONIX ) 40 MG tablet Take 1 tablet (40 mg total) by mouth daily. 11/09/23   Ricky Fines, MD  sodium chloride  1 g tablet Take 1 tablet (1 g total) by mouth daily. 11/08/23   Ricky Fines, MD    Allergies: Patient has no known allergies.    Review of Systems  Unable to perform ROS: Other (Aphasia versus altered mental status)    Updated Vital Signs BP 138/72   Pulse 83   Resp 19   Ht 5' 7 (1.702 m)   Wt 70.1 kg   SpO2 100%   BMI 24.20 kg/m   Physical Exam Vitals and nursing note reviewed.  Constitutional:      Appearance: She is not toxic-appearing.  HENT:     Mouth/Throat:     Mouth: Mucous membranes are moist.  Eyes:     General: No scleral  icterus.    Pupils: Pupils are equal, round, and reactive to light.  Cardiovascular:     Rate and Rhythm: Normal rate.  Pulmonary:     Effort: Pulmonary effort is normal. No respiratory distress.  Abdominal:     Palpations: Abdomen is soft.     Tenderness: There is no abdominal tenderness. There is no guarding or rebound.  Musculoskeletal:     Cervical back: Normal range of motion.     Right lower leg: No edema.     Left lower leg: No edema.  Skin:    General: Skin is warm and dry.  Neurological:     Mental Status: She is alert.     Comments: Oriented to person and place but not time.  Patient difficult with following directions.  When asked to lift eyebrows she was sticking up her tongue and moving it side-to-side.  This  seemed to have some slurred speech however difficult to see if this is at patient's baseline or not.  Patient was able to tell me that this was like this since her last stroke however was not completely clear.  She does move all extremities to commands accurately.  No pronator drift.  Strength does appear to be intact in upper and lower extremities.  Sensation reportedly intact throughout per patient.     (all labs ordered are listed, but only abnormal results are displayed) Labs Reviewed  CBC - Abnormal; Notable for the following components:      Result Value   WBC 11.3 (*)    RBC 3.26 (*)    Hemoglobin 10.7 (*)    HCT 31.3 (*)    All other components within normal limits  DIFFERENTIAL - Abnormal; Notable for the following components:   Neutro Abs 8.3 (*)    Monocytes Absolute 1.7 (*)    Abs Immature Granulocytes 0.13 (*)    All other components within normal limits  COMPREHENSIVE METABOLIC PANEL WITH GFR - Abnormal; Notable for the following components:   Sodium 130 (*)    Chloride 97 (*)    Creatinine, Ser 1.51 (*)    Calcium  8.6 (*)    Albumin  3.2 (*)    GFR, Estimated 38 (*)    All other components within normal limits  I-STAT CHEM 8, ED - Abnormal; Notable for the following components:   Sodium 129 (*)    Chloride 97 (*)    Creatinine, Ser 1.70 (*)    Glucose, Bld 101 (*)    Hemoglobin 11.6 (*)    HCT 34.0 (*)    All other components within normal limits  ETHANOL  PROTIME-INR  APTT  RAPID URINE DRUG SCREEN, HOSP PERFORMED  VALPROIC  ACID LEVEL  LAMOTRIGINE  LEVEL  CBG MONITORING, ED    EKG: None  Radiology: No results found.  .Critical Care  Performed by: Bernis Ernst, PA-C Authorized by: Bernis Ernst, PA-C   Critical care provider statement:    Critical care time (minutes):  40   Critical care was necessary to treat or prevent imminent or life-threatening deterioration of the following conditions:  CNS failure or compromise   Critical care was time spent  personally by me on the following activities:  Development of treatment plan with patient or surrogate, discussions with consultants, evaluation of patient's response to treatment, examination of patient, ordering and review of laboratory studies, ordering and review of radiographic studies, ordering and performing treatments and interventions, pulse oximetry, re-evaluation of patient's condition, review of old charts and obtaining history from  patient or surrogate Comments:     Code stroke involving consultation with neurology multiple times, stat imaging, and conversation with family and extensive chart review.    Medications Ordered in the ED - No data to display  Clinical Course as of 12/22/23 1918  Tue Dec 22, 2023  8190 History is limited, it appears to possibly be baseline from previous chart review. Charge called family and went to voicemail. No other contact information in the chart. It appears the patient has known seizure disordered and h/o hyponatremia. I have paged neurology for consultation. Unsure what was altered about the patient give current presentation. I have ordered stat labs, seizure precuations, and CT Head [RR]  1812 Attempted call to [facility] No answer. HIPAA compliant voicemail left.  [RR]  1816 Attempted call to brother, no answer.  [RR]  1825 On the phone with sister. Sister reports that her speech is her baseline. She slept at night and woke her up this morning around 0800. Ate breakfast, said she was tired. Did get her morning medications. Took her BP 120/87. She said she was tired and went to sleep for around 3 hours and woke up around 1200. Gave her some water and a popsicle and checked her BP again as she was still looking sluggish and her BP was 89/65 and then repeated at 87/64. Tried to give her some gatorade. Sister reports that her skin started to get pale and was reporting that she was sleepy and this was around 1530. Then BP improved to 128/72 with talking with  her, but then said her pupils were dilated and her lips were pale.  Sister reports that she can not feel her legs, but she can move them.  [RR]  1834 Sister will be here at 7:20PM.  [RR]  1842 Still have not heard back from neuro. Secretary is repaging again  [RR]    Clinical Course User Index [RR] Bernis Ernst, PA-C   Medical Decision Making Amount and/or Complexity of Data Reviewed Labs: ordered. Radiology: ordered.  Risk Prescription drug management.   63 y.o. female presents to the ER for evaluation of AMS (?). Differential diagnosis includes but is not limited to Drug-related, hypoxia, hyper/hypoglycemia, encephalopathy, sepsis, DKA/HHS, brain lesion, CVA, seizure, environmental, psychiatric. Vital signs . Physical exam as noted above.   On previous chart evaluation, history is difficult to obtain due to patient's nonverbal status as well family members not answering phone calls.  History is limited with EMS.  It appears that this may be patient's baseline in appearance and last known well was greater than 4 hours ago.  Please see ED workup.  After family member called back after almost an hour, did get new updated last known well at 1530.  At this time, code stroke was called given ambiguity of presentation as both patient and members were difficult historians. Dr. Floyce does not recommend thrombolytics at this time.  Of note, my initial neurological exam in comparison to the neurological exam given by telemetry neuro has greatly improved.  Question if this is not some postictal state or some metabolic encephalopathy versus potential new CVA.  I independently reviewed and interpreted the patient's labs.  So CMP shows sodium at 130 which is more elevated the patient's baseline of hyponatremia.  Chloride at 97.  Creatinine is elevated at 1.51 from previous around 1.0-1.2.  Calcium  8.6, even at 3.2.  No other electrolyte or LFT abnormality.  CBC does show slight leukocytosis 11.3.   Hemoglobin 10.7 with hematocrit  31.3.  Normal platelets.  Anemia at baseline.  Normal APTT and PT/INR.  Valproic  acid within normal limits.  Ethanol undetectable.  Lamotrigine  level still pending.  UDS still need to be collected.  Ammonia so need to be collected as well.  CBG within normal limits.  Spoke with Dr. Geni Maroon with neurology. She recommends CT Perfusion study.   Dr. Maroon called back saying the CTA looks okay does not show any perfusion defects.  She recommends observation and and to keep an eye on her.  Recommends MRI and EEG but does not need to be stat and to be done during inpatient stay.  Does recommend continuing her at home medications but no need to give her loading doses.  I have ordered the patient some fluids for her AKI we will need urinalysis to rule out any UTI.  Will handoff to oncoming shift to follow-up with imaging and pending lab work.  Plan is likely for admission.  7:32 PM Care of CHRYSTA FULCHER transferred to PA Idol and Dr. Melvenia at the end of my shift as the patient will require reassessment once labs/imaging have resulted. Patient presentation, ED course, and plan of care discussed with review of all pertinent labs and imaging. Please see his/her note for further details regarding further ED course and disposition. Plan at time of handoff is follow up with CT and labs. This may be altered or completely changed at the discretion of the oncoming team pending results of further workup.  Portions of this report may have been transcribed using voice recognition software. Every effort was made to ensure accuracy; however, inadvertent computerized transcription errors may be present.    Final diagnoses:  None    ED Discharge Orders     None          Bernis Ernst, PA-C 12/22/23 [address] PA-C 12/22/23 2009    Melvenia Motto, MD 12/22/23 [phone]

## 2023-12-22 NOTE — Consult Note (Addendum)
 [facility] Consult Services   Patient Name:   Cindy [name], Cindy [name] Date of Birth:   1961/04/17 Identification Number:   MRN - [id number] Date of Service:   12/22/2023 18:56:32  Diagnosis:       G93.41 - Encephalopathy Metabolic  Impression:      15F who presented after being found lethargic, with pale lips, hypotension, followed by diffuse weakness, more difficulty with her speech and following commands. Her LKN is difficult to fully discern as it seems she was lethargic even earlier in the day prior to the reported onset of 1530; I am unable to confirm this with family as they are not answering the phone. At the time of the symptoms which prompted EMS eval, she was noted to be hypotensive.    Additionally, her symptoms have significantly improved at this time since presentation, suggestive of possible seizure and postictal state. She does not have any clear focal neurologic deficit though she does have mild to moderate speech abnormality which fluctuates. She is fully intelligible.    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 CTA head and neck and CTP does not show evidence of LVO or perfusion defect to suggest infarct as the cause of her symptoms.    Given that her B[place]N is unconfirmed and based on documented history is likely > 4.5h ago, suspicion for other source of her symptoms, and near her previously documented baseline, the risk of thrombolytics outweighs potential for benefit at this time.    Recommend continuing with home antiseizure medications at this time and admit for management of her mild hyponatremia, AKI, recommend checking for infection as she does have a leukocytosis and was hypotensive prior to arrival. Consider MRI Brain wo Contrast and Routine EEG.    Our recommendations are outlined below.  Recommendations:        Stroke/Telemetry Floor       Neuro  Checks (Q4)       Bedside Swallow Eval       DVT Prophylaxis       IV Fluids, Normal Saline       Head of Bed 30 Degrees       Euglycemia and Avoid Hyperthermia (PRN Acetaminophen )       Initiate or continue Aspirin  325 MG daily       Antihypertensives PRN if Blood pressure is greater than 220/120 or there is a concern for End organ damage/contraindications for permissive HTN. If blood pressure is greater than 220/120 give labetalol  PO or IV or Vasotec IV with a goal of 15% reduction in BP during the first 24 hours.  Sign Out:       Discussed with Emergency Department Provider    ------------------------------------------------------------------------------  Advanced Imaging:  CTA Head and Neck Completed.  CTP Completed.  LVO:No   Metrics: Last Known Well: Unknown Dispatch Time: 12/22/2023 18:56:32 Arrival Time: 12/22/2023 17:42:00 Initial Response Time: 12/22/2023 18:58:32 Symptoms: Encephalopathy, Lethargy . Initial patient interaction: 12/22/2023 19:03:36 NIHSS Assessment Completed: 12/22/2023 19:11:14 Patient is not a candidate for Thrombolytic. Thrombolytic Medical Decision: 12/22/2023 19:11:49 Patient was not deemed candidate for Thrombolytic because of following reasons: LKW outside 4.5 hr window. . Stroke severity too mild (non-disabling) . Care-team unable to determine eligibility. .  CT Head: I personally reviewed all the CT images that were available to me and it showed: no acute abnormality, no acute hemorrhage or infarct.  Primary Provider Notified of Diagnostic Impression and Management  Plan on: 12/22/2023 19:44:52    ------------------------------------------------------------------------------  History of Present Illness: Patient is a Cindy [name].  Patient was brought by EMS for symptoms of Encephalopathy, Lethargy . Cindy [name] with a PMH of seizure disorder on Lamictal  and Depakote , recurrent episodes of hyponatremia, who presents with increased  fatigue, pale lips, and lethargy after she woke up from a nap. It was initially reported symptom discovery was 4h prior to arrival with EMS.  The ED was able to reach the sister and the sister reported she noticed the symptoms at 1530. It is unclear if this was symptom onset or symptom discovery as it was initially reported she woke up with the symptoms.  It seems she got up around 1200 and had a popsicle and was looking sluggish and lethargic with hypotension at that time. At 1530 she told her sister that she was sleepy and it was noted her pupils were dilated and lips were pale. She has residual bilateral leg weakness and sensory deficit, worse on the left from her prior stroke and also reportedly she is able to say some words and have a simple conversation.  It is difficult to fully understand her baseline functional status. I have tried reaching the patient's family but there is no answer. Based on her last SLP evaluation on 6/30, patient has documented cognitive deficits. She also at baseline has dysphagia and eats pureed food. It is documented she chronically uses a wheelchair at home. I attempted to reach her sister, who was with the patient, and there was no answer.  Per the ED PA, she has improved significantly since arrival.  Per her last outpatient neurology note she takes Lamictal  75 mg BID, Depakote  500 mg BID.  She has tried Vimpat  200 mg BID and had hallucinations.    Past Medical History:      Hypertension      Hyperlipidemia      Stroke      Seizures  Medications:  No Anticoagulant use  Antiplatelet use: Yes aspirin  325 mg daily Reviewed EMR for current medications  Allergies:  Reviewed,NKDA  Social History: Smoking: No Alcohol Use: No Drug Use: Former  Family History:  There Is Family History Of:HTN, Cancer There is no family history of premature cerebrovascular disease pertinent to this consultation  ROS : 14 Points Review of Systems was performed and was  negative except mentioned in HPI.  Past Surgical History: There Is No Surgical History Contributory To Today's Visit     Examination: BP(138/72), Pulse(83), Blood Glucose(101) 1A: Level of Consciousness - Alert; keenly responsive + 0 1B: Ask Month and Age - Both Questions Right + 0 1C: Blink Eyes & Squeeze Hands - Performs Both Tasks + 0 2: Test Horizontal Extraocular Movements - Normal + 0 3: Test Visual Fields - No Visual Loss + 0 4: Test Facial Palsy (Use Grimace if Obtunded) - Normal symmetry + 0 5A: Test Left Arm Motor Drift - No Drift for 10 Seconds + 0 5B: Test Right Arm Motor Drift - No Drift for 10 Seconds + 0 6A: Test Left Leg Motor Drift - No Drift for 5 Seconds + 0 6B: Test Right Leg Motor Drift - No Drift for 5 Seconds + 0 7: Test Limb Ataxia (FNF/Heel-Shin) - No Ataxia + 0 8: Test Sensation - Normal; No sensory loss + 0 9: Test Language/Aphasia - Mild-Moderate Aphasia: Some Obvious Changes, Without Significant Limitation + 1 10: Test Dysarthria - Normal + 0 11: Test Extinction/Inattention - No  abnormality + 0  NIHSS Score: 1  NIHSS Free Text : BUE and BLE ataxia which is very mild. Oriented to person and place but not time. Some loss of verbal fluency but naming intact, able to answer simple questions when repeated. She has relatively intact conversational output. Speech is fully intelligible; intermittent dysarthria but only with distraction.  Pre-Morbid Modified Rankin Scale: 4 Points = Moderately severe disability; unable to walk and attend to bodily needs without assistance  Spoke with : Carlo Cornish, PA-C I reviewed the available imaging via [facility] and initiated discussion with the primary provider  This consult was conducted in real time using interactive audio and video technology. Patient was informed of the technology being used for this visit and agreed to proceed. Patient located in hospital and provider located at home/office setting.   Patient is being  evaluated for possible acute neurologic impairment and high probability of imminent or life-threatening deterioration. I spent total of 55 minutes providing care to this patient, including time for face to face visit via telemedicine, review of medical records, imaging studies and discussion of findings with providers, the patient and/or family.    Dr Geni Maroon   [facility] For Inpatient follow-up with [facility] physician please call [facility] at [phone]. As we are not an outpatient service for any post hospital discharge needs please contact the hospital for assistance. If you have any questions for the [facility] physicians or need to reconsult for clinical or diagnostic changes please contact us  via [facility] at [phone].   Signature : Geni Maroon

## 2023-12-22 NOTE — H&P (Signed)
 History and Physical    Cindy [name] FMW:[id number] DOB: 03-13-1961 DOA: 12/22/2023  PCP: Alston Silvio BROCKS, FNP   Patient coming from: Home   Chief Complaint: AMS   HPI: Cindy [name] is a 63 y.o. female with medical history significant for hypertension, hyperlipidemia, seizure disorder, history of CVA, SIADH, and CKD 3A who presents for evaluation of altered mental status.  Patient is alert at time of admission, has no acute complaints, but does not remember the events from earlier today that led to her presentation.  The patient's sister states that at approximately 3:30 PM, the patient was noted to be staring off, pale, and was not responding to her.  SBP was in the 80s at the time.  Patient had reported feeling tired earlier in the day and again after her episode.  Patient slowly became more interactive.  ED Course: Upon arrival to the ED, patient is found to be afebrile and saturating well on room air with stable BP.  EKG demonstrates sinus rhythm, head CT is negative for acute intracranial abnormality, and there is no emergent large vessel occlusion or hemodynamically significant stenosis on CTA of the head and neck.  Labs are most notable for sodium 130, creatinine 1.51, WBC 11,300, and hemoglobin 10.7.  Patient was evaluated by teleneurology in the emergency department and admission for further workup including MRI, EEG, and infectious workup was recommended.  The patient was given a liter of LR in the ED.  Review of Systems:  ROS limited by patient's clinical condition.  Past Medical History:  Diagnosis Date   Abnormal LFTs    Hx of   CVA (cerebral vascular accident) (HCC) 2005   Hyperlipidemia    Hypertension    Polysubstance dependence including opioid type drug, episodic abuse (HCC)    cocaine/etoh    Seizures (HCC)     Past Surgical History:  Procedure Laterality Date   ESOPHAGOGASTRODUODENOSCOPY N/A 08/16/2019   Procedure: ESOPHAGOGASTRODUODENOSCOPY (EGD);  Surgeon:  Rosalie Kitchens, MD;  Location: [facility] ENDOSCOPY;  Service: Endoscopy;  Laterality: N/A;    Social History:   reports that she has never smoked. She has never used smokeless tobacco. She reports that she does not currently use alcohol. She reports that she does not currently use drugs after having used the following drugs: Cocaine.  No Known Allergies  Family History  Problem Relation Age of Onset   Cancer Father        prostate    Hypertension Mother    Asthma Mother    Bronchitis Mother      Prior to Admission medications   Medication Sig Start Date End Date Taking? Authorizing Provider  Melatonin 3 MG SUBL Take 1 tablet by mouth at bedtime. 12/16/23  Yes [provider]  acetaminophen  (TYLENOL ) 325 MG tablet Take 2 tablets (650 mg total) by mouth every 6 (six) hours as needed (pain). 09/21/20   Angiulli, Toribio PARAS, PA-C  aspirin  325 MG tablet Take 1 tablet (325 mg total) by mouth daily. 09/21/20   Angiulli, Toribio PARAS, PA-C  atorvastatin  (LIPITOR) 40 MG tablet Take 1 tablet (40 mg total) by mouth at bedtime. 10/26/20   Angiulli, Toribio PARAS, PA-C  divalproex  (DEPAKOTE ) 500 MG DR tablet Take 1 tablet (500 mg total) by mouth 2 (two) times daily. 11/23/22   Danford, Lonni SQUIBB, MD  docusate sodium  (COLACE) 50 MG capsule Take 50 mg by mouth daily.    [provider]  folic acid  (FOLVITE ) 1 MG tablet Take 1  tablet (1 mg total) by mouth daily. 10/26/20   Angiulli, Toribio PARAS, PA-C  lamoTRIgine  (LAMICTAL ) 25 MG tablet Take 75 mg by mouth 2 (two) times daily. 08/04/23   [provider]  levothyroxine  (SYNTHROID ) 25 MCG tablet Take 25 mcg by mouth every morning. 08/04/23   [provider]  lisinopril  (ZESTRIL ) 5 MG tablet Take 5 mg by mouth daily. 11/06/22   [provider]  metFORMIN (GLUCOPHAGE) 500 MG tablet Take 500 mg by mouth daily. 08/05/23   [provider]  OLANZapine  (ZYPREXA ) 2.5 MG tablet Take 2.5 mg by mouth at bedtime. 12/15/23   [provider]  ondansetron  (ZOFRAN -ODT) 4 MG disintegrating tablet Take 4 mg by mouth every 8 (eight) hours as needed.    [provider]  pantoprazole  (PROTONIX ) 40 MG tablet Take 1 tablet (40 mg total) by mouth daily. 11/09/23   Ricky Fines, MD  sodium chloride  1 g tablet Take 1 tablet (1 g total) by mouth daily. 11/08/23   Ricky Fines, MD    Physical Exam: Vitals:   12/22/23 2045 12/22/23 2046 12/22/23 2100 12/22/23 2115  BP: (!) 161/73  (!) 142/83 (!) 150/78  Pulse: 93 95 94 89  Resp: 20 18 19 16   Temp:      TempSrc:      SpO2:  97% 98% 95%  Weight:      Height:         Constitutional: NAD, calm  Eyes: PERTLA, lids and conjunctivae normal ENMT: Mucous membranes are moist. Posterior pharynx clear of any exudate or lesions.   Neck: supple, no masses  Respiratory: no wheezing, no crackles. No accessory muscle use.  Cardiovascular: S1 & S2 heard, regular rate and rhythm. No extremity edema.   Abdomen: No tenderness, soft. Bowel sounds active.  Musculoskeletal: no clubbing / cyanosis. No joint deformity upper and lower extremities.   Skin: no significant rashes, lesions, ulcers. Warm, dry, well-perfused. Neurologic: Expressive aphasia, dysarthria, PERRL, EOMI. Moving all extremities. Alert and oriented to person, place, and situation.   Psychiatric: Pleasant. Cooperative.    Labs and Imaging on Admission: I have personally reviewed following labs and imaging studies  CBC: Recent Labs  Lab 12/22/23 1817 12/22/23 1825  WBC 11.3*  --   NEUTROABS 8.3*  --   HGB 10.7* 11.6*  HCT 31.3* 34.0*  MCV 96.0  --   PLT 229  --    Basic Metabolic Panel: Recent Labs  Lab 12/22/23 1817 12/22/23 1825  NA 130* 129*  K 4.4 4.6  CL 97* 97*  CO2 22  --   GLUCOSE 99 101*  BUN 19 19  CREATININE 1.51* 1.70*  CALCIUM  8.6*  --    GFR: Estimated Creatinine Clearance: 32.5 mL/min (A) (by C-G formula based on SCr of 1.7 mg/dL (H)). Liver Function Tests: Recent Labs  Lab  12/22/23 1817  AST 22  ALT 15  ALKPHOS 83  BILITOT 0.6  PROT 7.2  ALBUMIN  3.2*   No results for input(s): LIPASE, AMYLASE in the last 168 hours. No results for input(s): AMMONIA in the last 168 hours. Coagulation Profile: Recent Labs  Lab 12/22/23 1817  INR 1.0   Cardiac Enzymes: No results for input(s): CKTOTAL, CKMB, CKMBINDEX, TROPONINI in the last 168 hours. BNP (last 3 results) No results for input(s): PROBNP in the last 8760 hours. HbA1C: No results for input(s): HGBA1C in the last 72 hours. CBG: Recent Labs  Lab 12/22/23 1805  GLUCAP 96   Lipid Profile: No  results for input(s): CHOL, HDL, LDLCALC, TRIG, CHOLHDL, LDLDIRECT in the last 72 hours. Thyroid Function Tests: No results for input(s): TSH, T4TOTAL, FREET4, T3FREE, THYROIDAB in the last 72 hours. Anemia Panel: No results for input(s): VITAMINB12, FOLATE, FERRITIN, TIBC, IRON, RETICCTPCT in the last 72 hours. Urine analysis:    Component Value Date/Time   COLORURINE YELLOW 11/20/2022 0645   APPEARANCEUR CLOUDY (A) 11/20/2022 0645   LABSPEC 1.033 (H) 11/20/2022 0645   PHURINE 7.0 11/20/2022 0645   GLUCOSEU NEGATIVE 11/20/2022 0645   HGBUR MODERATE (A) 11/20/2022 0645   BILIRUBINUR NEGATIVE 11/20/2022 0645   KETONESUR NEGATIVE 11/20/2022 0645   PROTEINUR NEGATIVE 11/20/2022 0645   NITRITE NEGATIVE 11/20/2022 0645   LEUKOCYTESUR SMALL (A) 11/20/2022 0645   Sepsis Labs: @LABRCNTIP (procalcitonin:4,lacticidven:4) )No results found for this or any previous visit (from the past 240 hours).   Radiological Exams on Admission: CT ANGIO HEAD NECK W WO CM W PERF (CODE STROKE) Result Date: 12/22/2023 CLINICAL DATA:  Neuro deficit, acute, stroke suspected EXAM: CT ANGIOGRAPHY HEAD AND NECK CT PERFUSION BRAIN TECHNIQUE: Multidetector CT imaging of the head and neck was performed using the standard protocol during bolus administration of intravenous contrast.  Multiplanar CT image reconstructions and MIPs were obtained to evaluate the vascular anatomy. Carotid stenosis measurements (when applicable) are obtained utilizing NASCET criteria, using the distal internal carotid diameter as the denominator. Multiphase CT imaging of the brain was performed following IV bolus contrast injection. Subsequent parametric perfusion maps were calculated using RAPID software. RADIATION DOSE REDUCTION: This exam was performed according to the departmental dose-optimization program which includes automated exposure control, adjustment of the mA and/or kV according to patient size and/or use of iterative reconstruction technique. CONTRAST:  80mL OMNIPAQUE  IOHEXOL  350 MG/ML SOLN COMPARISON:  None Available. FINDINGS: CTA NECK FINDINGS Aortic arch: Great vessel origins are patent no significant stenosis. Right carotid system: No evidence of dissection, stenosis (50% or greater) or occlusion. Left carotid system: No evidence of dissection, stenosis (50% or greater) or occlusion. Vertebral arteries: Codominant. No evidence of dissection, stenosis (50% or greater) or occlusion. Skeleton: Other neck: No acute abnormality on limited assessment. Upper chest: Lung apices are clear. Review of the MIP images confirms the above findings CTA HEAD FINDINGS Anterior circulation: Bilateral intracranial ICAs, MCAs and ACAs are patent without proximal hemodynamically significant stenosis. Azygous ACA, anatomic variant. Posterior circulation: Bilateral intradural vertebral arteries, basilar artery and bilateral posterior cerebral artery are patent. Moderate bilateral intradural vertebral artery stenosis. Venous sinuses: As permitted by contrast timing, patent. Review of the MIP images confirms the above findings CT Brain Perfusion Findings: ASPECTS: 10 CBF (<30%) Volume: 0mL Perfusion (Tmax>6.0s) volume: 0mL Mismatch Volume: 0mL Infarction Location:None identified. IMPRESSION: No emergent large vessel  occlusion or proximal a hemodynamically significant stenosis. Imaging results were communicated on 12/22/2023 at 7:57 pm to provider Dr.Dickson via telephone Electronically Signed   By: Gilmore GORMAN Molt M.D.   On: 12/22/2023 20:00   CT HEAD CODE STROKE WO CONTRAST Result Date: 12/22/2023 CLINICAL DATA:  Code stroke.  Neuro deficit, acute, stroke suspected EXAM: CT HEAD WITHOUT CONTRAST TECHNIQUE: Contiguous axial images were obtained from the base of the skull through the vertex without intravenous contrast. RADIATION DOSE REDUCTION: This exam was performed according to the departmental dose-optimization program which includes automated exposure control, adjustment of the mA and/or kV according to patient size and/or use of iterative reconstruction technique. COMPARISON:  CT head August 19, 2023. FINDINGS: Brain: No evidence of acute infarction, hemorrhage, hydrocephalus, extra-axial collection or  mass lesion/mass effect. Small remote infarct in the posterior limb of the right internal capsule. Patchy white matter hypodensities are compatible with chronic microvascular ischemic disease. Cerebral atrophy. Vascular: Calcific atherosclerosis. No hyperdense vessel identified. Skull: No acute fracture. Sinuses/Orbits: Clear sinuses.  No acute orbital findings. Other: No mastoid effusions. ASPECTS [facility] Stroke Program Early CT Score) Total score (0-10 with 10 being normal): Sent. IMPRESSION: No evidence of acute intracranial abnormality.  ASPECTS 10. Electronically Signed   By: Gilmore GORMAN Molt M.D.   On: 12/22/2023 19:51    EKG: Independently reviewed. Sinus rhythm.   Assessment/Plan   1. Acute encephalopathy  - Keep NPO pending swallow screen, check MRI brain, EEG, UA, and CXR, continue neuro checks, treat AKI and hyponatremia     2. AKI superimposed on CKD 3A  - Hold lisinopril , continue IVF hydration, repeat chem panel in am    3. Hypertension  - Permit HTN for now, treat as-needed only per neurology  recommendations   4. Hyponatremia; SIADH  - Appears stable/improved  - Continue salt tabs, monitor    5. Seizures  - Continue Depakote  and Lamictal     6. Hx of CVA  - ASA and Lipitor   7. Hypothyroidism  - Synthroid     DVT prophylaxis: Lovenox   Code Status: DNR  Level of Care: Level of care: Telemetry Family Communication: Sister updated from ED   Disposition Plan:  Patient is from: home  Anticipated d/c is to: TBD Anticipated d/c date is: 8/6 or 12/24/23  Patient currently: Pending further workup, stable mental status  Consults called: Teleneurology  Admission status: observation     Evalene GORMAN Sprinkles, MD [facility]  12/22/2023, 9:21 PM

## 2023-12-22 NOTE — ED Triage Notes (Signed)
 Pt woke from nap about 4 hours ago and is not acting herself. Pt is very lethargic per family. Pt has history of seizures and stroke.

## 2023-12-23 ENCOUNTER — Observation Stay (HOSPITAL_COMMUNITY)

## 2023-12-23 ENCOUNTER — Telehealth (HOSPITAL_COMMUNITY): Payer: Self-pay | Admitting: Pharmacy Technician

## 2023-12-23 ENCOUNTER — Observation Stay (HOSPITAL_COMMUNITY)
Admit: 2023-12-23 | Discharge: 2023-12-23 | Disposition: A | Discharge status: Home / Self Care | Attending: Family Medicine | Admitting: Family Medicine

## 2023-12-23 ENCOUNTER — Other Ambulatory Visit (HOSPITAL_COMMUNITY): Payer: Self-pay

## 2023-12-23 DIAGNOSIS — G9340 Encephalopathy, unspecified: Secondary | ICD-10-CM | POA: Diagnosis not present

## 2023-12-23 LAB — GASTROINTESTINAL PANEL BY PCR, STOOL (REPLACES STOOL CULTURE)

## 2023-12-23 LAB — BASIC METABOLIC PANEL WITH GFR
Anion gap: 10 (ref 5–15)
BUN: 14 mg/dL (ref 8–23)
CO2: 22 mmol/L (ref 22–32)
Calcium: 8.4 mg/dL — ABNORMAL LOW (ref 8.9–10.3)
Chloride: 99 mmol/L (ref 98–111)
Creatinine, Ser: 1.13 mg/dL — ABNORMAL HIGH (ref 0.44–1.00)
GFR, Estimated: 54 mL/min — ABNORMAL LOW (ref >60)
Glucose, Bld: 74 mg/dL (ref 70–99)
Potassium: 4.2 mmol/L (ref 3.5–5.1)
Sodium: 131 mmol/L — ABNORMAL LOW (ref 135–145)

## 2023-12-23 LAB — CBC
HCT: 30.3 % — ABNORMAL LOW (ref 36.0–46.0)
Hemoglobin: 10.2 g/dL — ABNORMAL LOW (ref 12.0–15.0)
MCH: 33.1 pg (ref 26.0–34.0)
MCHC: 33.7 g/dL (ref 30.0–36.0)
MCV: 98.4 fL (ref 80.0–100.0)
Platelets: 190 K/uL (ref 150–400)
RBC: 3.08 MIL/uL — ABNORMAL LOW (ref 3.87–5.11)
RDW: 13.3 % (ref 11.5–15.5)
WBC: 10.8 K/uL — ABNORMAL HIGH (ref 4.0–10.5)
nRBC: 0 % (ref 0.0–0.2)

## 2023-12-23 LAB — LAMOTRIGINE LEVEL: Lamotrigine Lvl: 10.9 ug/mL (ref 2.0–20.0)

## 2023-12-23 LAB — C DIFFICILE QUICK SCREEN W PCR REFLEX
C Diff antigen: POSITIVE — AB
C Diff interpretation: DETECTED
C Diff toxin: POSITIVE — AB

## 2023-12-23 MED ORDER — SODIUM CHLORIDE 0.9 % IV SOLN: INTRAVENOUS | Status: AC

## 2023-12-23 MED ORDER — VANCOMYCIN HCL 125 MG PO CAPS: 125.0000 mg | ORAL_CAPSULE | Freq: Four times a day (QID) | ORAL | Status: DC

## 2023-12-23 MED ORDER — LABETALOL HCL 5 MG/ML IV SOLN: 10.0000 mg | INTRAVENOUS | Status: DC | PRN

## 2023-12-23 MED ORDER — VANCOMYCIN HCL 125 MG PO CAPS
125.0000 mg | ORAL_CAPSULE | Freq: Four times a day (QID) | ORAL | Status: DC
  Administered 2023-12-23 – 2023-12-24 (×7): 125 mg via ORAL
  Filled 2023-12-23 (×7): qty 1

## 2023-12-23 NOTE — Procedures (Signed)
 Patient Name: Cindy [name]  MRN: [id number]  Epilepsy Attending: Pastor Falling  Referring Physician/Provider: No ref. provider found      Date: 12/23/2023 Duration: 23 minutes   Patient history: 1F who presented after being found lethargic, with pale lips, hypotension, followed by diffuse weakness, more difficulty with her speech and following commands. EEG to evaluate for seizures   Level of alertness: Drowsy, sleep, and awake at the end of the recording   AEDs during EEG study: Depakote , Lamotrigine    Technical aspects: This EEG study was done with scalp electrodes positioned according to the 10-20 International system of electrode placement. Electrical activity was reviewed with band pass filter of 1-70Hz , sensitivity of 7 uV/mm, display speed of 47mm/sec with a 60Hz  notched filter applied as appropriate. EEG data were recorded continuously and digitally stored.  Video monitoring was available and reviewed as appropriate.  Description: The posterior dominant rhythm consists of 9 Hz activity of moderate voltage (25-35 uV) seen predominantly in posterior head regions, symmetric and reactive to eye opening and eye closing. Drowsiness was characterized by attenuation of the posterior background rhythm. Sleep was characterized by vertex waves, sleep spindles (12 to 14 Hz), maximal frontocentral region. Physiologic photic driving was not seen during photic stimulation.  Hyperventilation was not performed.     ABNORMALITY -None  IMPRESSION: This study is within normal limits. No seizures or epileptiform discharges were seen throughout the recording.  A normal interictal EEG does not exclude nor support the diagnosis of epilepsy.   Pastor Falling MD Neurology

## 2023-12-23 NOTE — Plan of Care (Signed)

## 2023-12-23 NOTE — Progress Notes (Addendum)
 Lab called at 0430, pt is positive for Cdiff. MD Opyd made aware, see new orders.

## 2023-12-23 NOTE — Plan of Care (Signed)
  Problem: Nutrition: Goal: Risk of aspiration will decrease Outcome: Progressing   Problem: Nutrition: Goal: Dietary intake will improve Outcome: Progressing   Problem: Activity: Goal: Risk for activity intolerance will decrease Outcome: Progressing

## 2023-12-23 NOTE — Telephone Encounter (Signed)
 Patient Product/process development scientist completed.    The patient is insured through [facility] MEDICAID.     Ran test claim for vancomycin  125 mg and the current 10 day co-pay is $0.00.  Ran test claim for Dificid 200 mg and Requires Prior Authorization  This test claim was processed through [employer]- copay amounts may vary at other pharmacies due to [employer], or as the patient moves through the different stages of their insurance plan.     Reyes Sharps, CPHT Pharmacy Technician III Certified Patient Advocate [facility] Patient Advocate Team Direct Number: [phone]  Fax: [phone]

## 2023-12-23 NOTE — Progress Notes (Signed)
 PROGRESS NOTE   [name], is a 63 y.o. female, DOB - 09-26-1960, FMW:[id number]  Admit date - 12/22/2023   Admitting Physician Evalene GORMAN Sprinkles, MD  Outpatient Primary MD for the patient is Bucio, Silvio BROCKS, FNP  LOS - 0  Chief Complaint  Patient presents with   Altered Mental Status        Brief Narrative:  63 y.o. female with medical history significant for hypertension, hyperlipidemia, seizure disorder, history of CVA, SIADH, and CKD 3A  Admitted on 12/22/2023 with acute metabolic encephalopathy      -Assessment and Plan:  1. Acute Encephalopathy  -  MRI brain and EEG  w/o acute findings - UA, and CXR, continue neuro checks, treat AKI and hyponatremia      2. AKI superimposed on CKD 3A  - Hold lisinopril , continue IVF hydration- -Creatinine down to 1.1 from 1.70 - renally adjust medications, avoid nephrotoxic agents / dehydration  / hypotension  3)C Diff---+ve -----started oral Vanco  4. Hyponatremia; SIADH  - Appears stable/improved  - Continue salt tabs, monitor   Avoid excessive free water   5. Seizures  - Continue Depakote  and Lamictal      6. Hx of CVA  - ASA and Lipitor    7. Hypothyroidism  - Synthroid    8-Hypertension  - BP stable    Status is: Inpatient   Disposition: The patient is from: Home              Anticipated d/c is to: Home              Anticipated d/c date is: 1 day              Patient currently is not medically stable to d/c. Barriers: Not Clinically Stable-   Code Status : -  Code Status: Limited: Do not attempt resuscitation (DNR) -DNR-LIMITED -Do Not Intubate/DNI    Family Communication:   Sister and Niece at bedside  DVT Prophylaxis  :   - SCDs  enoxaparin  (LOVENOX ) injection 40 mg Start: 12/22/23 2200   Lab Results  Component Value Date   PLT 190 12/23/2023    Inpatient Medications  Scheduled Meds:   stroke: early stages of recovery book   Does not apply Once   aspirin   325 mg Oral Daily   atorvastatin   40 mg Oral QHS    divalproex   500 mg Oral BID   enoxaparin  (LOVENOX ) injection  40 mg Subcutaneous Q24H   lamoTRIgine   75 mg Oral BID   levothyroxine   25 mcg Oral q morning   OLANZapine   2.5 mg Oral QHS   sodium chloride   1 g Oral Daily   vancomycin   125 mg Oral QID   Continuous Infusions: PRN Meds:.acetaminophen  **OR** acetaminophen  (TYLENOL ) oral liquid 160 mg/5 mL **OR** acetaminophen , senna-docusate   Anti-infectives (From admission, onward)    Start     Dose/Rate Route Frequency Ordered Stop   12/23/23 1000  vancomycin  (VANCOCIN ) capsule 125 mg  Status:  Discontinued        125 mg Oral 4 times daily 12/23/23 0442 12/23/23 0442   12/23/23 0530  vancomycin  (VANCOCIN ) capsule 125 mg        125 mg Oral 4 times daily 12/23/23 0442 01/01/24 2159        Subjective: Mercedees Convery today has no fevers, no emesis,  No chest pain,   - More awake Sister and Niece at bedside - No Nausea, Vomiting or Diarrhea   Objective: Vitals:  12/22/23 2253 12/23/23 0304 12/23/23 0614 12/23/23 1515  BP: (!) 162/79 (!) 150/90 (!) 123/98 (!) 111/58  Pulse: 90 84 85 83  Resp: 18 16 16    Temp: 99 F (37.2 C) 99.1 F (37.3 C) 98.7 F (37.1 C) 99.2 F (37.3 C)  TempSrc: Oral Oral  Oral  SpO2: 93% 90% 97%   Weight:      Height:        Intake/Output Summary (Last 24 hours) at 12/23/2023 1837 Last data filed at 12/22/2023 2222 Gross per 24 hour  Intake 1000 ml  Output --  Net 1000 ml   Filed Weights   12/22/23 1748  Weight: 70.1 kg    Physical Exam  Gen:- Awake Alert,  in no apparent distress  HEENT:- [place].AT, No sclera icterus Neck-Supple Neck,No JVD,.  Lungs-  CTAB , fair symmetrical air movement CV- S1, S2 normal, regular  Abd-  +ve B.Sounds, Abd Soft, No tenderness,    Extremity/Skin:- No  edema, pedal pulses present  Psych-affect is appropriate, oriented x3 Neuro-generalized weakness, no new focal deficits, no tremors  Data Reviewed: I have personally reviewed following labs and imaging  studies  CBC: Recent Labs  Lab 12/22/23 1817 12/22/23 1825 12/23/23 0411  WBC 11.3*  --  10.8*  NEUTROABS 8.3*  --   --   HGB 10.7* 11.6* 10.2*  HCT 31.3* 34.0* 30.3*  MCV 96.0  --  98.4  PLT 229  --  190   Basic Metabolic Panel: Recent Labs  Lab 12/22/23 1817 12/22/23 1825 12/23/23 0411  NA 130* 129* 131*  K 4.4 4.6 4.2  CL 97* 97* 99  CO2 22  --  22  GLUCOSE 99 101* 74  BUN 19 19 14   CREATININE 1.51* 1.70* 1.13*  CALCIUM  8.6*  --  8.4*   GFR: Estimated Creatinine Clearance: 48.9 mL/min (A) (by C-G formula based on SCr of 1.13 mg/dL (H)). Liver Function Tests: Recent Labs  Lab 12/22/23 1817  AST 22  ALT 15  ALKPHOS 83  BILITOT 0.6  PROT 7.2  ALBUMIN  3.2*   Recent Results (from the past 240 hours)  C Difficile Quick Screen w PCR reflex     Status: Abnormal   Collection Time: 12/23/23  2:53 AM   Specimen: STOOL  Result Value Ref Range Status   C Diff antigen POSITIVE (A) NEGATIVE Final   C Diff toxin POSITIVE (A) NEGATIVE Final   C Diff interpretation Toxin producing C. difficile detected.  Final    Comment: CRITICAL RESULT CALLED TO, READ BACK BY AND VERIFIED WITH: B BROOKS,LPN @0430  12/23/23 [facility] Performed at [facility], [address] [place], [place] [address]     Radiology Studies: MR BRAIN WO CONTRAST Result Date: 12/23/2023 EXAM: MRI BRAIN WITHOUT CONTRAST 12/23/2023 11:09:56 AM TECHNIQUE: Multiplanar multisequence MRI of the head/brain was performed without the administration of intravenous contrast. COMPARISON: MRI of the head dated 11/19/2022. CLINICAL HISTORY: Mental status change, unknown cause. FINDINGS: BRAIN AND VENTRICLES: There is extensive cerebral volume loss present, which is accelerated for the patient's age. There is also extensive periventricular and deep cerebral white matter disease present. There are chronic lacunar infarcts within the basal ganglia and right thalamus. No acute infarct. No intracranial hemorrhage. No mass. No midline  shift. No hydrocephalus. The sella is unremarkable. Normal flow voids. ORBITS: No acute abnormality. SINUSES AND MASTOIDS: There is mild mucosal disease present within the floors of the maxillary sinuses. No acute abnormality. BONES AND SOFT TISSUES: Normal marrow signal.  No acute soft tissue abnormality. IMPRESSION: 1. Extensive cerebral volume loss, accelerated for the patient's age. 2. Extensive periventricular and deep cerebral white matter disease. 3. Chronic lacunar infarcts within the basal ganglia and right thalamus. Electronically signed by: evalene coho 12/23/2023 12:18 PM EDT RP Workstation: [id number]   EEG adult Result Date: 12/23/2023 Gregg Lek, MD     12/23/2023 12:19 PM Patient Name: Cindy [name] MRN: [id number] Epilepsy Attending: Lek Gregg Referring Physician/Provider: No ref. provider found     Date: 12/23/2023 Duration: 23 minutes Patient history: 56F who presented after being found lethargic, with pale lips, hypotension, followed by diffuse weakness, more difficulty with her speech and following commands. EEG to evaluate for seizures Level of alertness: Drowsy, sleep, and awake at the end of the recording AEDs during EEG study: Depakote , Lamotrigine  Technical aspects: This EEG study was done with scalp electrodes positioned according to the 10-20 International system of electrode placement. Electrical activity was reviewed with band pass filter of 1-70Hz , sensitivity of 7 uV/mm, display speed of 80mm/sec with a 60Hz  notched filter applied as appropriate. EEG data were recorded continuously and digitally stored.  Video monitoring was available and reviewed as appropriate. Description: The posterior dominant rhythm consists of 9 Hz activity of moderate voltage (25-35 uV) seen predominantly in posterior head regions, symmetric and reactive to eye opening and eye closing. Drowsiness was characterized by attenuation of the posterior background rhythm. Sleep was characterized by vertex  waves, sleep spindles (12 to 14 Hz), maximal frontocentral region. Physiologic photic driving was not seen during photic stimulation.  Hyperventilation was not performed.   ABNORMALITY -None IMPRESSION: This study is within normal limits. No seizures or epileptiform discharges were seen throughout the recording. A normal interictal EEG does not exclude nor support the diagnosis of epilepsy. Lek Gregg MD Neurology    DG CHEST PORT 1 VIEW Result Date: 12/22/2023 CLINICAL DATA:  [id number] Acute encephalopathy [id number]. History of seizures. Lethargic. Altered EXAM: PORTABLE CHEST 1 VIEW COMPARISON:  Chest x-ray 11/19/2022 FINDINGS: The heart and mediastinal contours are within normal limits. Low lung volumes. No focal consolidation. Chronic coarsened interstitial markings with no overt pulmonary edema. No pleural effusion. No pneumothorax. No acute osseous abnormality. IMPRESSION: No acute cardiopulmonary disease. Electronically Signed   By: Morgane  Naveau M.D.   On: 12/22/2023 22:28   CT ANGIO HEAD NECK W WO CM W PERF (CODE STROKE) Result Date: 12/22/2023 CLINICAL DATA:  Neuro deficit, acute, stroke suspected EXAM: CT ANGIOGRAPHY HEAD AND NECK CT PERFUSION BRAIN TECHNIQUE: Multidetector CT imaging of the head and neck was performed using the standard protocol during bolus administration of intravenous contrast. Multiplanar CT image reconstructions and MIPs were obtained to evaluate the vascular anatomy. Carotid stenosis measurements (when applicable) are obtained utilizing NASCET criteria, using the distal internal carotid diameter as the denominator. Multiphase CT imaging of the brain was performed following IV bolus contrast injection. Subsequent parametric perfusion maps were calculated using RAPID software. RADIATION DOSE REDUCTION: This exam was performed according to the departmental dose-optimization program which includes automated exposure control, adjustment of the mA and/or kV according to patient size  and/or use of iterative reconstruction technique. CONTRAST:  80mL OMNIPAQUE  IOHEXOL  350 MG/ML SOLN COMPARISON:  None Available. FINDINGS: CTA NECK FINDINGS Aortic arch: Great vessel origins are patent no significant stenosis. Right carotid system: No evidence of dissection, stenosis (50% or greater) or occlusion. Left carotid system: No evidence of dissection, stenosis (50% or greater) or occlusion. Vertebral arteries: Codominant. No evidence of dissection, stenosis (  50% or greater) or occlusion. Skeleton: Other neck: No acute abnormality on limited assessment. Upper chest: Lung apices are clear. Review of the MIP images confirms the above findings CTA HEAD FINDINGS Anterior circulation: Bilateral intracranial ICAs, MCAs and ACAs are patent without proximal hemodynamically significant stenosis. Azygous ACA, anatomic variant. Posterior circulation: Bilateral intradural vertebral arteries, basilar artery and bilateral posterior cerebral artery are patent. Moderate bilateral intradural vertebral artery stenosis. Venous sinuses: As permitted by contrast timing, patent. Review of the MIP images confirms the above findings CT Brain Perfusion Findings: ASPECTS: 10 CBF (<30%) Volume: 0mL Perfusion (Tmax>6.0s) volume: 0mL Mismatch Volume: 0mL Infarction Location:None identified. IMPRESSION: No emergent large vessel occlusion or proximal a hemodynamically significant stenosis. Imaging results were communicated on 12/22/2023 at 7:57 pm to provider Dr.Dickson via telephone Electronically Signed   By: Gilmore GORMAN Molt M.D.   On: 12/22/2023 20:00   CT HEAD CODE STROKE WO CONTRAST Result Date: 12/22/2023 CLINICAL DATA:  Code stroke.  Neuro deficit, acute, stroke suspected EXAM: CT HEAD WITHOUT CONTRAST TECHNIQUE: Contiguous axial images were obtained from the base of the skull through the vertex without intravenous contrast. RADIATION DOSE REDUCTION: This exam was performed according to the departmental dose-optimization program  which includes automated exposure control, adjustment of the mA and/or kV according to patient size and/or use of iterative reconstruction technique. COMPARISON:  CT head August 19, 2023. FINDINGS: Brain: No evidence of acute infarction, hemorrhage, hydrocephalus, extra-axial collection or mass lesion/mass effect. Small remote infarct in the posterior limb of the right internal capsule. Patchy white matter hypodensities are compatible with chronic microvascular ischemic disease. Cerebral atrophy. Vascular: Calcific atherosclerosis. No hyperdense vessel identified. Skull: No acute fracture. Sinuses/Orbits: Clear sinuses.  No acute orbital findings. Other: No mastoid effusions. ASPECTS [facility] Stroke Program Early CT Score) Total score (0-10 with 10 being normal): Sent. IMPRESSION: No evidence of acute intracranial abnormality.  ASPECTS 10. Electronically Signed   By: Gilmore GORMAN Molt M.D.   On: 12/22/2023 19:51   Scheduled Meds:   stroke: early stages of recovery book   Does not apply Once   aspirin   325 mg Oral Daily   atorvastatin   40 mg Oral QHS   divalproex   500 mg Oral BID   enoxaparin  (LOVENOX ) injection  40 mg Subcutaneous Q24H   lamoTRIgine   75 mg Oral BID   levothyroxine   25 mcg Oral q morning   OLANZapine   2.5 mg Oral QHS   sodium chloride   1 g Oral Daily   vancomycin   125 mg Oral QID   Continuous Infusions:   LOS: 0 days   Rendall Carwin M.D on 12/23/2023 at 6:37 PM  Go to [email or web address] - for contact info  [facility] - Office  [phone]  If 7PM-7AM, please contact night-coverage [email or web address] 12/23/2023, 6:37 PM

## 2023-12-23 NOTE — Progress Notes (Signed)
 Transition of Care Department [facility]) has reviewed patient and no other TOC needs have been identified at this time. We will continue to monitor patient advancement through interdisciplinary progression rounds. If new patient transition needs arise, please place a TOC consult.   12/23/23 0755  TOC Brief Assessment  Insurance and Status Reviewed  Patient has primary care physician Yes  Home environment has been reviewed Lives with sister.  Prior level of function: Sister assists with ADLs.  Prior/Current Home Services No current home services  Social Drivers of Health Review SDOH reviewed no interventions necessary  Readmission risk has been reviewed Yes  Transition of care needs no transition of care needs at this time

## 2023-12-23 NOTE — Progress Notes (Signed)
 EEG complete - results pending

## 2023-12-24 DIAGNOSIS — G9340 Encephalopathy, unspecified: Secondary | ICD-10-CM | POA: Diagnosis not present

## 2023-12-24 LAB — URINALYSIS, W/ REFLEX TO CULTURE (INFECTION SUSPECTED)
Bilirubin Urine: NEGATIVE
Glucose, UA: NEGATIVE mg/dL
Hgb urine dipstick: NEGATIVE
Ketones, ur: NEGATIVE mg/dL
Nitrite: NEGATIVE
Protein, ur: NEGATIVE mg/dL
Specific Gravity, Urine: 1.013 (ref 1.005–1.030)
pH: 6 (ref 5.0–8.0)

## 2023-12-24 LAB — CBC
HCT: 30.2 % — ABNORMAL LOW (ref 36.0–46.0)
Hemoglobin: 10 g/dL — ABNORMAL LOW (ref 12.0–15.0)
MCH: 31.9 pg (ref 26.0–34.0)
MCHC: 33.1 g/dL (ref 30.0–36.0)
MCV: 96.5 fL (ref 80.0–100.0)
Platelets: 194 K/uL (ref 150–400)
RBC: 3.13 MIL/uL — ABNORMAL LOW (ref 3.87–5.11)
RDW: 13.6 % (ref 11.5–15.5)
WBC: 6.2 K/uL (ref 4.0–10.5)
nRBC: 0 % (ref 0.0–0.2)

## 2023-12-24 LAB — BASIC METABOLIC PANEL WITH GFR
Anion gap: 6 (ref 5–15)
BUN: 13 mg/dL (ref 8–23)
CO2: 22 mmol/L (ref 22–32)
Calcium: 8.2 mg/dL — ABNORMAL LOW (ref 8.9–10.3)
Chloride: 101 mmol/L (ref 98–111)
Creatinine, Ser: 1.02 mg/dL — ABNORMAL HIGH (ref 0.44–1.00)
GFR, Estimated: >60 mL/min (ref >60)
Glucose, Bld: 94 mg/dL (ref 70–99)
Potassium: 4.1 mmol/L (ref 3.5–5.1)
Sodium: 129 mmol/L — ABNORMAL LOW (ref 135–145)

## 2023-12-24 MED ORDER — METAMUCIL FIBER 2 G PO CHEW: 1.0000 | CHEWABLE_TABLET | Freq: Two times a day (BID) | ORAL | Status: AC

## 2023-12-24 MED ORDER — VANCOMYCIN HCL 125 MG PO CAPS: 125.0000 mg | ORAL_CAPSULE | Freq: Four times a day (QID) | ORAL | 0 refills | Status: AC

## 2023-12-24 NOTE — Plan of Care (Signed)
  Problem: Ischemic Stroke/TIA Tissue Perfusion: Goal: Complications of ischemic stroke/TIA will be minimized Outcome: Progressing   Problem: Coping: Goal: Will verbalize positive feelings about self Outcome: Progressing Goal: Will identify appropriate support needs Outcome: Progressing   Problem: Health Behavior/Discharge Planning: Goal: Ability to manage health-related needs will improve Outcome: Progressing   Problem: Health Behavior/Discharge Planning: Goal: Goals will be collaboratively established with patient/family Outcome: Progressing   Problem: Self-Care: Goal: Ability to participate in self-care as condition permits will improve Outcome: Progressing Goal: Verbalization of feelings and concerns over difficulty with self-care will improve Outcome: Progressing Goal: Ability to communicate needs accurately will improve Outcome: Progressing   Problem: Nutrition: Goal: Risk of aspiration will decrease Outcome: Progressing   Problem: Education: Goal: Knowledge of General Education information will improve Description: Including pain rating scale, medication(s)/side effects and non-pharmacologic comfort measures Outcome: Progressing   Problem: Activity: Goal: Risk for activity intolerance will decrease Outcome: Progressing   Problem: Nutrition: Goal: Adequate nutrition will be maintained Outcome: Progressing   Problem: Coping: Goal: Level of anxiety will decrease Outcome: Progressing   Problem: Elimination: Goal: Will not experience complications related to bowel motility Outcome: Progressing Goal: Will not experience complications related to urinary retention Outcome: Progressing   Problem: Safety: Goal: Ability to remain free from injury will improve Outcome: Progressing   Problem: Skin Integrity: Goal: Risk for impaired skin integrity will decrease Outcome: Progressing

## 2023-12-24 NOTE — Plan of Care (Signed)
   Problem: Education: Goal: Knowledge of disease or condition will improve Outcome: Adequate for Discharge Goal: Knowledge of secondary prevention will improve (MUST DOCUMENT ALL) Outcome: Adequate for Discharge Goal: Knowledge of patient specific risk factors will improve (DELETE if not current risk factor) Outcome: Adequate for Discharge   Problem: Ischemic Stroke/TIA Tissue Perfusion: Goal: Complications of ischemic stroke/TIA will be minimized Outcome: Adequate for Discharge   Problem: Coping: Goal: Will verbalize positive feelings about self Outcome: Adequate for Discharge Goal: Will identify appropriate support needs Outcome: Adequate for Discharge   Problem: Health Behavior/Discharge Planning: Goal: Ability to manage health-related needs will improve Outcome: Adequate for Discharge Goal: Goals will be collaboratively established with patient/family Outcome: Adequate for Discharge   Problem: Self-Care: Goal: Ability to participate in self-care as condition permits will improve Outcome: Adequate for Discharge Goal: Verbalization of feelings and concerns over difficulty with self-care will improve Outcome: Adequate for Discharge Goal: Ability to communicate needs accurately will improve Outcome: Adequate for Discharge   Problem: Nutrition: Goal: Risk of aspiration will decrease Outcome: Adequate for Discharge Goal: Dietary intake will improve Outcome: Adequate for Discharge   Problem: Education: Goal: Knowledge of General Education information will improve Description: Including pain rating scale, medication(s)/side effects and non-pharmacologic comfort measures Outcome: Adequate for Discharge   Problem: Health Behavior/Discharge Planning: Goal: Ability to manage health-related needs will improve Outcome: Adequate for Discharge   Problem: Clinical Measurements: Goal: Ability to maintain clinical measurements within normal limits will improve Outcome: Adequate for  Discharge Goal: Will remain free from infection Outcome: Adequate for Discharge Goal: Diagnostic test results will improve Outcome: Adequate for Discharge Goal: Respiratory complications will improve Outcome: Adequate for Discharge Goal: Cardiovascular complication will be avoided Outcome: Adequate for Discharge   Problem: Activity: Goal: Risk for activity intolerance will decrease Outcome: Adequate for Discharge   Problem: Nutrition: Goal: Adequate nutrition will be maintained Outcome: Adequate for Discharge   Problem: Coping: Goal: Level of anxiety will decrease Outcome: Adequate for Discharge   Problem: Elimination: Goal: Will not experience complications related to bowel motility Outcome: Adequate for Discharge Goal: Will not experience complications related to urinary retention Outcome: Adequate for Discharge   Problem: Pain Managment: Goal: General experience of comfort will improve and/or be controlled Outcome: Adequate for Discharge   Problem: Safety: Goal: Ability to remain free from injury will improve Outcome: Adequate for Discharge   Problem: Skin Integrity: Goal: Risk for impaired skin integrity will decrease Outcome: Adequate for Discharge

## 2023-12-24 NOTE — Discharge Summary (Signed)
 Cindy [name], is a 63 y.o. female  DOB [birth year]  MRN [id number].  Admission date:  12/22/2023  Admitting Physician  Evalene GORMAN Sprinkles, MD  Discharge Date:  12/24/2023   Primary MD  Bucio, Silvio BROCKS, FNP  Recommendations for primary care physician for things to follow:   1) continue medications as advised 2) avoid constipation--- okay to use Metamucil gummy or Senokot-S as needed for for constipation 3) repeat BMP blood test in about a week advised  Admission Diagnosis  Acute encephalopathy [G93.40]   Discharge Diagnosis  Acute encephalopathy [G93.40]    Principal Problem:   Acute encephalopathy Active Problems:   Essential hypertension   Seizures (HCC)   Hyponatremia   Acute renal failure superimposed on stage 3a chronic kidney disease (HCC)   History of stroke   Hypothyroidism      Past Medical History:  Diagnosis Date   Abnormal LFTs    Hx of   CVA (cerebral vascular accident) (HCC) 2005   Hyperlipidemia    Hypertension    Polysubstance dependence including opioid type drug, episodic abuse (HCC)    cocaine/etoh    Seizures (HCC)     Past Surgical History:  Procedure Laterality Date   ESOPHAGOGASTRODUODENOSCOPY N/A 08/16/2019   Procedure: ESOPHAGOGASTRODUODENOSCOPY (EGD);  Surgeon: Rosalie Kitchens, MD;  Location: [facility] ENDOSCOPY;  Service: Endoscopy;  Laterality: N/A;     HPI  from the history and physical done on the day of admission:    HPI: Cindy [name] is a 63 y.o. female with medical history significant for hypertension, hyperlipidemia, seizure disorder, history of CVA, SIADH, and CKD 3A who presents for evaluation of altered mental status.   Patient is alert at time of admission, has no acute complaints, but does not remember the events from earlier today that led to her presentation.  The patient's sister states that at approximately 3:30 PM, the patient was noted to be staring off, pale,  and was not responding to her.  SBP was in the 80s at the time.  Patient had reported feeling tired earlier in the day and again after her episode.  Patient slowly became more interactive.   ED Course: Upon arrival to the ED, patient is found to be afebrile and saturating well on room air with stable BP.  EKG demonstrates sinus rhythm, head CT is negative for acute intracranial abnormality, and there is no emergent large vessel occlusion or hemodynamically significant stenosis on CTA of the head and neck.  Labs are most notable for sodium 130, creatinine 1.51, WBC 11,300, and hemoglobin 10.7.   Patient was evaluated by teleneurology in the emergency department and admission for further workup including MRI, EEG, and infectious workup was recommended.  The patient was given a liter of LR in the ED.   Review of Systems:  ROS limited by patient's clinical condition.     Hospital Course:     Brief Narrative:  63 y.o. female with medical history significant for hypertension, hyperlipidemia, seizure disorder, history of CVA, SIADH, and  CKD 3A  Admitted on 12/22/2023 with acute metabolic encephalopathy       -Assessment and Plan:   1. Acute Encephalopathy  -  MRI brain and EEG  w/o acute findings - According to patient's sister/caregiver at bedside mentation is back to baseline   2. AKI superimposed on CKD 3A  --Creatinine down to 1.1 from 1.70 - Avoid dehydration   3)C Diff---+ve -----started oral Vanco, complete oral Vanco as prescribed   4. Hyponatremia; SIADH  - Appears stable/improved  - Continue salt tabs, monitor   Avoid excessive free water   5. Seizures  - Continue Depakote  and Lamictal      6. Hx of CVA  - ASA and Lipitor for secondary prevention   7. Hypothyroidism  - Synthroid     8-Hypertension  - BP stable   9) generalized weakness/deconditioning--- PT eval appreciated -Prior to admission patient was able to pivot and assist with transfers, she is really  nonambulatory -She is hoping that the patient will improve to the point where she can pivot and transfer again with home health PT - Home health PT requested  10)?? UTI--- -Urine sample was sent overnight due to concerns about possible UTI- -patient currently being treated for C. difficile infection with oral vancomycin  - No urinary tract type infection symptoms per se - Hold off on systemic antibiotics at this time--await urine culture  Disposition: The patient is from: Home              Anticipated d/c is to: Home  Discharge Condition: stable  Follow UP   Follow-up Information     Bucio, Silvio BROCKS, FNP. Schedule an appointment as soon as possible for a visit in 1 week(s).   Specialty: Family Medicine Why: Repeat BMP in 1 week Contact information: [address] [place] [address] [phone]                 Diet and Activity recommendation:  As advised  Discharge Instructions    Discharge Instructions     Call MD for:  difficulty breathing, headache or visual disturbances   Complete by: As directed    Call MD for:  persistant dizziness or light-headedness   Complete by: As directed    Call MD for:  persistant nausea and vomiting   Complete by: As directed    Call MD for:  temperature >100.4   Complete by: As directed    Diet general   Complete by: As directed    Discharge instructions   Complete by: As directed    1) continue medications as advised 2) avoid constipation--- okay to use Metamucil gummy or Senokot-S as needed for for constipation 3) repeat BMP blood test in about a week advised   Increase activity slowly   Complete by: As directed          Discharge Medications     Allergies as of 12/24/2023   No Known Allergies      Medication List     STOP taking these medications    docusate sodium  50 MG capsule Commonly known as: COLACE       TAKE these medications    acetaminophen  325 MG tablet Commonly known as: TYLENOL  Take 2  tablets (650 mg total) by mouth every 6 (six) hours as needed (pain).   aspirin  325 MG tablet Take 1 tablet (325 mg total) by mouth daily.   atorvastatin  40 MG tablet Commonly known as: LIPITOR Take 1 tablet (40 mg total) by  mouth at bedtime.   divalproex  500 MG DR tablet Commonly known as: DEPAKOTE  Take 1 tablet (500 mg total) by mouth 2 (two) times daily.   folic acid  1 MG tablet Commonly known as: FOLVITE  Take 1 tablet (1 mg total) by mouth daily.   lamoTRIgine  25 MG tablet Commonly known as: LAMICTAL  Take 75 mg by mouth 2 (two) times daily.   levothyroxine  25 MCG tablet Commonly known as: SYNTHROID  Take 25 mcg by mouth every morning.   lisinopril  5 MG tablet Commonly known as: ZESTRIL  Take 5 mg by mouth daily.   Melatonin 3 MG Subl Take 1 tablet by mouth at bedtime.   Metamucil Fiber 2 g Chew Chew 1 tablet by mouth 2 (two) times daily. Metamucil Gummies take 1 twice a day with couple glasses of water/fluid   metFORMIN 500 MG tablet Commonly known as: GLUCOPHAGE Take 500 mg by mouth daily.   ondansetron  4 MG disintegrating tablet Commonly known as: ZOFRAN -ODT Take 4 mg by mouth in the morning.   sodium chloride  1 g tablet Take 1 tablet (1 g total) by mouth daily. What changed: when to take this   Valtoco  15 MG Dose 2 x 7.5 MG/0.1ML Lqpk Generic drug: diazePAM  (15 MG Dose) Place 1 Dose into both nostrils once as needed (seizure lasting longer than 2 minutes).   vancomycin  125 MG capsule Commonly known as: VANCOCIN  Take 1 capsule (125 mg total) by mouth 4 (four) times daily for 7 days.        Major procedures and Radiology Reports - PLEASE review detailed and final reports for all details, in brief -  MR BRAIN WO CONTRAST Result Date: 12/23/2023 EXAM: MRI BRAIN WITHOUT CONTRAST 12/23/2023 11:09:56 AM TECHNIQUE: Multiplanar multisequence MRI of the head/brain was performed without the administration of intravenous contrast. COMPARISON: MRI of the head  dated 11/19/2022. CLINICAL HISTORY: Mental status change, unknown cause. FINDINGS: BRAIN AND VENTRICLES: There is extensive cerebral volume loss present, which is accelerated for the patient's age. There is also extensive periventricular and deep cerebral white matter disease present. There are chronic lacunar infarcts within the basal ganglia and right thalamus. No acute infarct. No intracranial hemorrhage. No mass. No midline shift. No hydrocephalus. The sella is unremarkable. Normal flow voids. ORBITS: No acute abnormality. SINUSES AND MASTOIDS: There is mild mucosal disease present within the floors of the maxillary sinuses. No acute abnormality. BONES AND SOFT TISSUES: Normal marrow signal. No acute soft tissue abnormality. IMPRESSION: 1. Extensive cerebral volume loss, accelerated for the patient's age. 2. Extensive periventricular and deep cerebral white matter disease. 3. Chronic lacunar infarcts within the basal ganglia and right thalamus. Electronically signed by: evalene coho 12/23/2023 12:18 PM EDT RP Workstation: [id number]   EEG adult Result Date: 12/23/2023 Gregg Lek, MD     12/23/2023 12:19 PM Patient Name: Cindy [name] MRN: [id number] Epilepsy Attending: Lek Gregg Referring Physician/Provider: No ref. provider found     Date: 12/23/2023 Duration: 23 minutes Patient history: 25F who presented after being found lethargic, with pale lips, hypotension, followed by diffuse weakness, more difficulty with her speech and following commands. EEG to evaluate for seizures Level of alertness: Drowsy, sleep, and awake at the end of the recording AEDs during EEG study: Depakote , Lamotrigine  Technical aspects: This EEG study was done with scalp electrodes positioned according to the 10-20 International system of electrode placement. Electrical activity was reviewed with band pass filter of 1-70Hz , sensitivity of 7 uV/mm, display speed of 63mm/sec with a 60Hz  notched filter applied  as appropriate. EEG  data were recorded continuously and digitally stored.  Video monitoring was available and reviewed as appropriate. Description: The posterior dominant rhythm consists of 9 Hz activity of moderate voltage (25-35 uV) seen predominantly in posterior head regions, symmetric and reactive to eye opening and eye closing. Drowsiness was characterized by attenuation of the posterior background rhythm. Sleep was characterized by vertex waves, sleep spindles (12 to 14 Hz), maximal frontocentral region. Physiologic photic driving was not seen during photic stimulation.  Hyperventilation was not performed.   ABNORMALITY -None IMPRESSION: This study is within normal limits. No seizures or epileptiform discharges were seen throughout the recording. A normal interictal EEG does not exclude nor support the diagnosis of epilepsy. Pastor Falling MD Neurology    DG CHEST PORT 1 VIEW Result Date: 12/22/2023 CLINICAL DATA:  [id number] Acute encephalopathy [phone]. History of seizures. Lethargic. Altered EXAM: PORTABLE CHEST 1 VIEW COMPARISON:  Chest x-ray 11/19/2022 FINDINGS: The heart and mediastinal contours are within normal limits. Low lung volumes. No focal consolidation. Chronic coarsened interstitial markings with no overt pulmonary edema. No pleural effusion. No pneumothorax. No acute osseous abnormality. IMPRESSION: No acute cardiopulmonary disease. Electronically Signed   By: Morgane  Naveau M.D.   On: 12/22/2023 22:28   CT ANGIO HEAD NECK W WO CM W PERF (CODE STROKE) Result Date: 12/22/2023 CLINICAL DATA:  Neuro deficit, acute, stroke suspected EXAM: CT ANGIOGRAPHY HEAD AND NECK CT PERFUSION BRAIN TECHNIQUE: Multidetector CT imaging of the head and neck was performed using the standard protocol during bolus administration of intravenous contrast. Multiplanar CT image reconstructions and MIPs were obtained to evaluate the vascular anatomy. Carotid stenosis measurements (when applicable) are obtained utilizing NASCET criteria,  using the distal internal carotid diameter as the denominator. Multiphase CT imaging of the brain was performed following IV bolus contrast injection. Subsequent parametric perfusion maps were calculated using RAPID software. RADIATION DOSE REDUCTION: This exam was performed according to the departmental dose-optimization program which includes automated exposure control, adjustment of the mA and/or kV according to patient size and/or use of iterative reconstruction technique. CONTRAST:  80mL OMNIPAQUE  IOHEXOL  350 MG/ML SOLN COMPARISON:  None Available. FINDINGS: CTA NECK FINDINGS Aortic arch: Great vessel origins are patent no significant stenosis. Right carotid system: No evidence of dissection, stenosis (50% or greater) or occlusion. Left carotid system: No evidence of dissection, stenosis (50% or greater) or occlusion. Vertebral arteries: Codominant. No evidence of dissection, stenosis (50% or greater) or occlusion. Skeleton: Other neck: No acute abnormality on limited assessment. Upper chest: Lung apices are clear. Review of the MIP images confirms the above findings CTA HEAD FINDINGS Anterior circulation: Bilateral intracranial ICAs, MCAs and ACAs are patent without proximal hemodynamically significant stenosis. Azygous ACA, anatomic variant. Posterior circulation: Bilateral intradural vertebral arteries, basilar artery and bilateral posterior cerebral artery are patent. Moderate bilateral intradural vertebral artery stenosis. Venous sinuses: As permitted by contrast timing, patent. Review of the MIP images confirms the above findings CT Brain Perfusion Findings: ASPECTS: 10 CBF (<30%) Volume: 0mL Perfusion (Tmax>6.0s) volume: 0mL Mismatch Volume: 0mL Infarction Location:None identified. IMPRESSION: No emergent large vessel occlusion or proximal a hemodynamically significant stenosis. Imaging results were communicated on 12/22/2023 at 7:57 pm to provider Dr.Dickson via telephone Electronically Signed   By:  Gilmore GORMAN Molt M.D.   On: 12/22/2023 20:00   CT HEAD CODE STROKE WO CONTRAST Result Date: 12/22/2023 CLINICAL DATA:  Code stroke.  Neuro deficit, acute, stroke suspected EXAM: CT HEAD WITHOUT CONTRAST TECHNIQUE: Contiguous axial images were obtained from  the base of the skull through the vertex without intravenous contrast. RADIATION DOSE REDUCTION: This exam was performed according to the departmental dose-optimization program which includes automated exposure control, adjustment of the mA and/or kV according to patient size and/or use of iterative reconstruction technique. COMPARISON:  CT head August 19, 2023. FINDINGS: Brain: No evidence of acute infarction, hemorrhage, hydrocephalus, extra-axial collection or mass lesion/mass effect. Small remote infarct in the posterior limb of the right internal capsule. Patchy white matter hypodensities are compatible with chronic microvascular ischemic disease. Cerebral atrophy. Vascular: Calcific atherosclerosis. No hyperdense vessel identified. Skull: No acute fracture. Sinuses/Orbits: Clear sinuses.  No acute orbital findings. Other: No mastoid effusions. ASPECTS [facility] Stroke Program Early CT Score) Total score (0-10 with 10 being normal): Sent. IMPRESSION: No evidence of acute intracranial abnormality.  ASPECTS 10. Electronically Signed   By: Gilmore GORMAN Molt M.D.   On: 12/22/2023 19:51    Micro Results   Recent Results (from the past 240 hours)  C Difficile Quick Screen w PCR reflex     Status: Abnormal   Collection Time: 12/23/23  2:53 AM   Specimen: STOOL  Result Value Ref Range Status   C Diff antigen POSITIVE (A) NEGATIVE Final   C Diff toxin POSITIVE (A) NEGATIVE Final   C Diff interpretation Toxin producing C. difficile detected.  Final    Comment: CRITICAL RESULT CALLED TO, READ BACK BY AND VERIFIED WITH: B BROOKS,LPN @0430  12/23/23 [facility] Performed at [facility], [address] [place], [place] [address]   Gastrointestinal Panel by PCR ,  Stool     Status: Abnormal   Collection Time: 12/23/23  2:53 AM   Specimen: STOOL  Result Value Ref Range Status   Campylobacter species NOT DETECTED NOT DETECTED Final   Plesimonas shigelloides NOT DETECTED NOT DETECTED Final   Salmonella species NOT DETECTED NOT DETECTED Final   Yersinia enterocolitica NOT DETECTED NOT DETECTED Final   Vibrio species NOT DETECTED NOT DETECTED Final   Vibrio cholerae NOT DETECTED NOT DETECTED Final   Enteroaggregative E coli (EAEC) NOT DETECTED NOT DETECTED Final   Enteropathogenic E coli (EPEC) NOT DETECTED NOT DETECTED Final   Enterotoxigenic E coli (ETEC) NOT DETECTED NOT DETECTED Final   Shiga like toxin producing E coli (STEC) NOT DETECTED NOT DETECTED Final   Shigella/Enteroinvasive E coli (EIEC) NOT DETECTED NOT DETECTED Final   Cryptosporidium NOT DETECTED NOT DETECTED Final   Cyclospora cayetanensis NOT DETECTED NOT DETECTED Final   Entamoeba histolytica NOT DETECTED NOT DETECTED Final   Giardia lamblia NOT DETECTED NOT DETECTED Final   Adenovirus F40/41 NOT DETECTED NOT DETECTED Final   Astrovirus DETECTED (A) NOT DETECTED Final   Norovirus GI/GII NOT DETECTED NOT DETECTED Final   Rotavirus A NOT DETECTED NOT DETECTED Final   Sapovirus (I, II, IV, and V) NOT DETECTED NOT DETECTED Final    Comment: Performed at [facility], [address] [place], [place] [address]    Today   Subjective    Arianna Haydon today has no new concerns - Patient Sister at bedside, questions answered -Oral intake improved -  Patient has been seen and examined prior to discharge   Objective   Blood pressure 123/74, pulse 98, temperature 98.4 F (36.9 C), temperature source Oral, resp. rate 18, height 5' 7 (1.702 m), weight 70.1 kg, SpO2 93%.   Intake/Output Summary (Last 24 hours) at 12/24/2023 1519 Last data filed at 12/24/2023 1242 Gross per 24 hour  Intake 770.23 ml  Output 655  ml  Net 115.23 ml    Exam en:- Awake Alert,  in no apparent  distress  HEENT:- [place].AT, No sclera icterus Neck-Supple Neck,No JVD,.  Lungs-  CTAB , fair symmetrical air movement CV- S1, S2 normal, regular  Abd-  +ve B.Sounds, Abd Soft, No tenderness,    Extremity/Skin:- No  edema, pedal pulses present  Psych-affect is appropriate, oriented x3 Neuro-generalized weakness, no tremors, residual speech deficits, chronic neuro muscular deficits--- no additional new focal neuro deficits   Data Review   CBC w Diff:  Lab Results  Component Value Date   WBC 6.2 12/24/2023   HGB 10.0 (L) 12/24/2023   HGB 14.8 03/26/2015   HCT 30.2 (L) 12/24/2023   HCT 42.3 03/26/2015   PLT 194 12/24/2023   PLT 363 03/26/2015   LYMPHOPCT 11 12/22/2023   MONOPCT 15 12/22/2023   EOSPCT 0 12/22/2023   BASOPCT 0 12/22/2023    CMP:  Lab Results  Component Value Date   NA 129 (L) 12/24/2023   NA 135 (L) 03/26/2015   K 4.1 12/24/2023   CL 101 12/24/2023   CO2 22 12/24/2023   BUN 13 12/24/2023   BUN 7 03/26/2015   CREATININE 1.02 (H) 12/24/2023   PROT 7.2 12/22/2023   PROT 7.7 03/26/2015   ALBUMIN  3.2 (L) 12/22/2023   ALBUMIN  4.4 03/26/2015   BILITOT 0.6 12/22/2023   BILITOT 0.6 03/26/2015   ALKPHOS 83 12/22/2023   AST 22 12/22/2023   ALT 15 12/22/2023  .  Total Discharge time is about 33 minutes  Rendall Carwin M.D on 12/24/2023 at 3:19 PM  Go to [email or web address] -  for contact info  [facility] - Office  [phone]

## 2023-12-24 NOTE — Evaluation (Signed)
 Physical Therapy Evaluation Patient Details Name: COLBIE SLIKER MRN: [id number] DOB: 1960/11/27 Today's Date: 12/24/2023  History of Present Illness  Cindy [name] is a 63 y.o. female with medical history significant for hypertension, hyperlipidemia, seizure disorder, history of CVA, SIADH, and CKD 3A who presents for evaluation of altered mental status.     Patient is alert at time of admission, has no acute complaints, but does not remember the events from earlier today that led to her presentation.  The patient's sister states that at approximately 3:30 PM, the patient was noted to be staring off, pale, and was not responding to her.  SBP was in the 80s at the time.  Patient had reported feeling tired earlier in the day and again after her episode.  Patient slowly became more interactive.   Clinical Impression  Patient functioning near baseline for functional mobility and non--ambulatory at baseline. Patient's caregiver (sister) instructed in safe handling of patient for transfers and encouraged to use gait belt to hold onto patient instead of letting patient reach around her neck with understanding acknowledged. Patient tolerated sitting up in chair after therapy with her sister present. PLAN:  Patient to be discharged home today and discharged from acute physical therapy to care of nursing for out of bed as tolerated for length of stay with recommendations stated below.           If plan is discharge home, recommend the following: A lot of help with walking and/or transfers;Help with stairs or ramp for entrance;Assistance with cooking/housework;A lot of help with bathing/dressing/bathroom;Assist for transportation   Can travel by private vehicle        Equipment Recommendations None recommended by PT  Recommendations for Other Services       Functional Status Assessment Patient has had a recent decline in their functional status and/or demonstrates limited ability to make significant  improvements in function in a reasonable and predictable amount of time     Precautions / Restrictions Precautions Precautions: Fall Recall of Precautions/Restrictions: Impaired Restrictions Weight Bearing Restrictions Per Provider Order: No      Mobility  Bed Mobility Overal bed mobility: Needs Assistance Bed Mobility: Supine to Sit     Supine to sit: Mod assist     General bed mobility comments: increased time, labored movement    Transfers   Equipment used: 1 person hand held assist                    Ambulation/Gait Ambulation/Gait assistance: Mod assist Gait Distance (Feet): 3 Feet Assistive device: 1 person hand held assist Gait Pattern/deviations: Decreased step length - right, Decreased step length - left, Decreased stride length Gait velocity: slow     General Gait Details: limited to a few slow labored steps with bilateral hand held assist  Stairs            Wheelchair Mobility     Tilt Bed    Modified Rankin (Stroke Patients Only)       Balance Overall balance assessment: Needs assistance Sitting-balance support: Feet supported, No upper extremity supported Sitting balance-Leahy Scale: Fair Sitting balance - Comments: fair/good seated at EOB   Standing balance support: During functional activity, Bilateral upper extremity supported Standing balance-Leahy Scale: Poor Standing balance comment: bilateral hand held assist using gait belt                             Pertinent Vitals/Pain Pain  Assessment Pain Assessment: No/denies pain    Home Living Family/patient expects to be discharged to:: Private residence Living Arrangements: Other relatives Available Help at Discharge: Family;Available 24 hours/day Type of Home: House Home Access: Ramped entrance       Home Layout: Able to live on main level with bedroom/bathroom Home Equipment: Rolling Walker (2 wheels);Wheelchair - manual;BSC/3in1;Hospital bed;Other  (comment) Additional Comments: lives with sister and brother in law, as well as nieces. sister is primary caregiver, and confirmed information today.    Prior Function Prior Level of Function : Needs assist       Physical Assist : ADLs (physical);Mobility (physical) Mobility (physical): Bed mobility;Transfers;Gait;Stairs   Mobility Comments: pt performs stand pivot transfers without RW and 1 person assist, has w/c she utilizes for mobility; pt has not walked for four years ADLs Comments: Able to feed self; assist for all other ADL's and IADL's     Extremity/Trunk Assessment   Upper Extremity Assessment Upper Extremity Assessment: Generalized weakness    Lower Extremity Assessment Lower Extremity Assessment: Generalized weakness    Cervical / Trunk Assessment Cervical / Trunk Assessment: Normal  Communication   Communication Communication: Impaired Factors Affecting Communication: Difficulty expressing self    Cognition Arousal: Alert Behavior During Therapy: WFL for tasks assessed/performed   PT - Cognitive impairments: No apparent impairments                         Following commands: Intact       Cueing       General Comments      Exercises     Assessment/Plan    PT Assessment All further PT needs can be met in the next venue of care  PT Problem List Decreased strength;Decreased activity tolerance;Decreased balance;Decreased mobility       PT Treatment Interventions      PT Goals (Current goals can be found in the Care Plan section)  Acute Rehab PT Goals Patient Stated Goal: return home with family to assist PT Goal Formulation: With patient/family Time For Goal Achievement: 12/24/23 Potential to Achieve Goals: Good    Frequency       Co-evaluation               AM-PAC PT 6 Clicks Mobility  Outcome Measure Help needed turning from your back to your side while in a flat bed without using bedrails?: A Little Help needed  moving from lying on your back to sitting on the side of a flat bed without using bedrails?: A Lot Help needed moving to and from a bed to a chair (including a wheelchair)?: A Lot Help needed standing up from a chair using your arms (e.g., wheelchair or bedside chair)?: A Lot Help needed to walk in hospital room?: A Lot Help needed climbing 3-5 steps with a railing? : Total 6 Click Score: 12    End of Session   Activity Tolerance: Patient tolerated treatment well;Patient limited by fatigue Patient left: in chair;with call bell/phone within reach;with family/visitor present Nurse Communication: Mobility status PT Visit Diagnosis: Unsteadiness on feet (R26.81);Other abnormalities of gait and mobility (R26.89);Muscle weakness (generalized) (M62.81)    Time: 1010-1030 PT Time Calculation (min) (ACUTE ONLY): 20 min   Charges:   PT Evaluation $PT Eval Moderate Complexity: 1 Mod PT Treatments $Therapeutic Activity: 8-22 mins PT General Charges $$ ACUTE PT VISIT: 1 Visit         2:11 PM, 12/24/23 Lynwood Music, MPT Physical Therapist with  [facility] [phone] office [phone] mobile phone

## 2023-12-24 NOTE — Discharge Instructions (Signed)
 1) continue medications as advised 2) avoid constipation--- okay to use Metamucil gummy or Senokot-S as needed for for constipation 3) repeat BMP blood test in about a week advised

## 2023-12-26 LAB — URINE CULTURE

## 2024-01-11 ENCOUNTER — Encounter: Payer: Self-pay | Admitting: Nutrition

## 2024-01-11 ENCOUNTER — Encounter: Payer: MEDICAID | Attending: Family Medicine | Admitting: Nutrition

## 2024-01-11 DIAGNOSIS — E440 Moderate protein-calorie malnutrition: Secondary | ICD-10-CM | POA: Diagnosis present

## 2024-01-11 DIAGNOSIS — R7303 Prediabetes: Secondary | ICD-10-CM | POA: Insufficient documentation

## 2024-01-11 DIAGNOSIS — R1310 Dysphagia, unspecified: Secondary | ICD-10-CM | POA: Diagnosis present

## 2024-01-11 DIAGNOSIS — N183 Chronic kidney disease, stage 3 unspecified: Secondary | ICD-10-CM | POA: Insufficient documentation

## 2024-01-11 NOTE — Patient Instructions (Signed)
 Goals Ask for Speech therapy home visit or sessions to follow up on swallowing issues Consider pureed fruit, or mashed potatoes or yogurt to give meds with instead of applesauce if desired. Swallow twice after bites to clear throat or complete swallowing. Cough up phlegm and spit out if possible Consider chest xray if needed if congestion continues or fever is present to rule out aspiration pneumonia. Keep up the great job. Avoid salty and processed foods Follow up on A1C next MD visit.

## 2024-01-11 NOTE — Progress Notes (Addendum)
 Medical Nutrition Therapy  Appointment Start time: 1400 Appointment End time:  1430  Primary concerns today: Pre Dm  Referral diagnosis: R73.03 Preferred learning style: No preference  Learning readiness: Ready   NUTRITION ASSESSMENT  63 yr old wfemale here for prediabetes.  She is here with her sister who is her caregiver and her husband.  Pre DM A1C 6.4, down to 6.2%. On Metformin 500 mg once a day.  Last follow up with Ms. Pennie Bucio per her sister, the blood work was ok. Likes mandarin oranges. Has been drinking some flavored water. Is taking Pepcid now and her throwing up is much better. Zophran has helped with nausea. No longer throwing up. Stili coughs some with mucous. Has some congestion. Hasn't had SLP at home yet. Would benefit from visits due to possible aspirations. No more diarrhea. ON Metformin 500 mg a day. Not on colace. Is on Metamucil  Eating three meals per day. Appetite is good. Bowles wnl. Urination is good. Getting in 1200 mls of fluid per day. Blowing nose better now. Recent visit with CKD MD was reported to be good. Still on sodium chloride  til next visit with labs. PT smiling. Nodes and verbalizes yes and thank you. Drinks some glucerna as needed. Unable to weigh today due to not being able to stand. No reported signs of weight loss. No reported skin breakdown.   Clinical Medical Hx:  Past Medical History:  Diagnosis Date   Abnormal LFTs    Hx of   CVA (cerebral vascular accident) (HCC) 2005   Hyperlipidemia    Hypertension    Polysubstance dependence including opioid type drug, episodic abuse (HCC)    cocaine/etoh    Seizures (HCC)    Medications:  Current Outpatient Medications on File Prior to Visit  Medication Sig Dispense Refill   acetaminophen  (TYLENOL ) 325 MG tablet Take 2 tablets (650 mg total) by mouth every 6 (six) hours as needed (pain).     aspirin  325 MG tablet Take 1 tablet (325 mg total) by mouth daily.     atorvastatin  (LIPITOR)  40 MG tablet Take 1 tablet (40 mg total) by mouth at bedtime. 30 tablet 1   diazePAM , 15 MG Dose, (VALTOCO  15 MG DOSE) 2 x 7.5 MG/0.1ML LQPK Place 1 Dose into both nostrils once as needed (seizure lasting longer than 2 minutes).     divalproex  (DEPAKOTE ) 500 MG DR tablet Take 1 tablet (500 mg total) by mouth 2 (two) times daily. 60 tablet 2   folic acid  (FOLVITE ) 1 MG tablet Take 1 tablet (1 mg total) by mouth daily. 30 tablet 1   lamoTRIgine  (LAMICTAL ) 25 MG tablet Take 75 mg by mouth 2 (two) times daily.     levothyroxine  (SYNTHROID ) 25 MCG tablet Take 25 mcg by mouth every morning.     lisinopril  (ZESTRIL ) 5 MG tablet Take 5 mg by mouth daily.     Melatonin 3 MG SUBL Take 1 tablet by mouth at bedtime.     Metamucil Fiber 2 g CHEW Chew 1 tablet by mouth 2 (two) times daily. Metamucil Gummies take 1 twice a day with couple glasses of water/fluid     metFORMIN (GLUCOPHAGE) 500 MG tablet Take 500 mg by mouth daily.     ondansetron  (ZOFRAN -ODT) 4 MG disintegrating tablet Take 4 mg by mouth in the morning.     sodium chloride  1 g tablet Take 1 tablet (1 g total) by mouth daily. (Patient taking differently: Take 1 g by mouth 3 (three)  times daily with meals.) 30 tablet 0   No current facility-administered medications on file prior to visit.    Labs:  Lab Results  Component Value Date   HGBA1C 6.1 (H) 11/07/2023      Latest Ref Rng & Units 12/24/2023    4:08 AM 12/23/2023    4:11 AM 12/22/2023    6:25 PM  CMP  Glucose 70 - 99 mg/dL 94  74  898   BUN 8 - 23 mg/dL 13  14  19    Creatinine 0.44 - 1.00 mg/dL 8.97  8.86  8.29   Sodium 135 - 145 mmol/L 129  131  129   Potassium 3.5 - 5.1 mmol/L 4.1  4.2  4.6   Chloride 98 - 111 mmol/L 101  99  97   CO2 22 - 32 mmol/L 22  22    Calcium  8.9 - 10.3 mg/dL 8.2  8.4     Lipid Panel     Component Value Date/Time   CHOL 105 11/20/2022 0207   CHOL 241 (H) 03/26/2015 0921   TRIG 48 11/20/2022 0207   HDL 51 11/20/2022 0207   HDL 40 03/26/2015 0921    CHOLHDL 2.1 11/20/2022 0207   VLDL 10 11/20/2022 0207   LDLCALC 44 11/20/2022 0207   LDLCALC 191 (H) 03/26/2015 0921   LABVLDL 10 03/26/2015 0921     Notable Signs/Symptoms:none No skin brekdown.  Lifestyle & Dietary Hx She lives with sister and her husband.10  Estimated daily fluid intake: 10 oz Supplements: Boost Sleep: 8 hrs Stress / self-care: cared by her sister.  Current average weekly physical activity: wheelchair  24-Hr Dietary Recall First Meal:  Multigrain  cherrios, or oatmeal Lunch 1/2 [place] sandwich with pudding, Chopped up and takes crust off Dinner: Meatloaf, string beans, sweet potato/white potatoes,  Glucerna once a day a few times per week.  Estimated Energy Needs Calories: 1200 Carbohydrate: 135g Protein: 90g Fat: 33g   NUTRITION DIAGNOSIS  NB-1.1 Food and nutrition-related knowledge deficit As related to Prediabetes.  As evidenced by A1c 6.4%.   NUTRITION INTERVENTION  Nutrition education (E-1) on the following topics:  Nutrition and Diabetes education provided on My Plate, CHO counting, meal planning, portion sizes, timing of meals, avoiding snacks between meals unless having a low blood sugar, target ranges for A1C and blood sugars, signs/symptoms and treatment of hyper/hypoglycemia, monitoring blood sugars, taking medications as prescribed, benefits of exercising 30 minutes per day and prevention of complications of DM.  Lifestyle Medicine  - Whole Food, Plant Predominant Nutrition is highly recommended: Eat Plenty of vegetables, Mushrooms, fruits, Legumes, Whole Grains, Nuts, seeds in lieu of processed meats, processed snacks/pastries red meat, poultry, eggs.    -It is better to avoid simple carbohydrates including: Cakes, Sweet Desserts, Ice Cream, Soda (diet and regular), Sweet Tea, Candies, Chips, Cookies, Store Bought Juices, Alcohol in Excess of  1-2 drinks a day, Lemonade,  Artificial Sweeteners, Doughnuts, Coffee Creamers, Sugar-free  Products, etc, etc.  This is not a complete list.....  Exercise: If you are able: 30 -60 minutes a day ,4 days a week, or 150 minutes a week.  The longer the better.  Combine stretch, strength, and aerobic activities.  If you were told in the past that you have high risk for cardiovascular diseases, you may seek evaluation by your heart doctor prior to initiating moderate to intense exercise programs.   Handouts Provided Include  My Plate Diabetes instructions Nutrition for lifestyle Full plate living  Learning Style &  Readiness for Change Teaching method utilized: Visual & Auditory  Demonstrated degree of understanding via: Teach Back  Barriers to learning/adherence to lifestyle change: none  Goals Established by Pt Goals Ask for Speech therapy home visit or sessions to follow up on swallowing issues Consider pureed fruit, or mashed potatoes or yogurt to give meds with instead of applesauce if desired. Swallow twice after bites to clear throat or complete swallowing. Cough up phlegm and spit out if possible Consider chest xray if needed if congestion continues or fever is present to rule out aspiration pneumonia. Keep up the great job. Avoid salty and processed foods Follow up on A1C next MD visit.  MONITORING & EVALUATION Dietary intake, weekly physical activity,  in 6 months.  Next Steps  Patient is to work on eating balanced meals .

## 2024-03-23 ENCOUNTER — Encounter (INDEPENDENT_AMBULATORY_CARE_PROVIDER_SITE_OTHER): Payer: Self-pay | Admitting: Gastroenterology

## 2024-04-06 ENCOUNTER — Ambulatory Visit (HOSPITAL_COMMUNITY): Payer: MEDICAID

## 2024-04-12 ENCOUNTER — Ambulatory Visit (HOSPITAL_COMMUNITY): Payer: MEDICAID

## 2024-05-27 NOTE — Therapy (Unsigned)
 " OUTPATIENT PHYSICAL THERAPY LOWER EXTREMITY EVALUATION   Patient Name: Cindy [name] MRN: [id number] DOB:1960/12/03, 64 y.o., female Today's Date: 05/27/2024  END OF SESSION:   Past Medical History:  Diagnosis Date   Abnormal LFTs    Hx of   CVA (cerebral vascular accident) (HCC) 2005   Hyperlipidemia    Hypertension    Polysubstance dependence including opioid type drug, episodic abuse (HCC)    cocaine/etoh    Seizures (HCC)    Past Surgical History:  Procedure Laterality Date   ESOPHAGOGASTRODUODENOSCOPY N/A 08/16/2019   Procedure: ESOPHAGOGASTRODUODENOSCOPY (EGD);  Surgeon: Rosalie Kitchens, MD;  Location: [facility] ENDOSCOPY;  Service: Endoscopy;  Laterality: N/A;   Patient Active Problem List   Diagnosis Date Noted   Hypothyroidism 12/22/2023   Seizure (HCC) 11/19/2022   Dysphagia    Agitation    Malnutrition of moderate degree 08/24/2020   Acute metabolic encephalopathy 08/23/2020   Polysubstance abuse (HCC) 08/23/2020   Acute renal failure superimposed on stage 3a chronic kidney disease (HCC) 08/23/2020   History of stroke 08/23/2020   Seizures (HCC) 02/09/2020   Hyponatremia 02/09/2020   SIRS (systemic inflammatory response syndrome) (HCC) 02/09/2020   Cocaine abuse (HCC) 02/09/2020   Ataxia due to recent stroke 08/19/2019   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   Cerebral embolism with cerebral infarction 08/13/2019   Status epilepticus (HCC) 08/05/2019   Acute encephalopathy 08/05/2019   Elevated AST (SGOT) 10/23/2009   HYPERCHOLESTEROLEMIA 07/03/2008   Alcohol abuse 07/03/2008   Essential hypertension 07/03/2008   Cerebral artery occlusion with cerebral infarction (HCC) 07/03/2008   Dysphagia 07/03/2008   Glucose intolerance 07/03/2008   Pain in joint, shoulder region 06/22/2008   Spondylosis 06/22/2008   CERVICALGIA 06/22/2008   SPINAL STENOSIS 06/22/2008   CERVICAL SPASM 06/22/2008    PCP: Alston Sherral BROCKS, FNP  REFERRING PROVIDER: Jonette Lauraine BRAVO,  PA-C  REFERRING DIAG: [phone] (ICD-10-CM) - Muscle weakness R29.898 (ICD-10-CM) - Muscular deconditioning  THERAPY DIAG:  No diagnosis found.  Rationale for Evaluation and Treatment: Rehabilitation  ONSET DATE:  SUBJECTIVE:   SUBJECTIVE STATEMENT: ***  PERTINENT HISTORY: *** PAIN:  Are you having pain? {OPRCPAIN:27236}  PRECAUTIONS: None  RED FLAGS: None   WEIGHT BEARING RESTRICTIONS: No  FALLS:  Has patient fallen in last 6 months? {fallsyesno:27318}  LIVING ENVIRONMENT: Lives with: {OPRC lives with:25569::lives with their family} Lives in: {Lives in:25570} Stairs: {opstairs:27293} Has following equipment at home: {Assistive devices:23999}  OCCUPATION: ***  PLOF: {PLOF:24004}  PATIENT GOALS: ***  NEXT MD VISIT: ***  OBJECTIVE:  Note: Objective measures were completed at Evaluation unless otherwise noted.  DIAGNOSTIC FINDINGS:   12/2023:  IMPRESSION: 1. Extensive cerebral volume loss, accelerated for the patient's age. 2. Extensive periventricular and deep cerebral white matter disease. 3. Chronic lacunar infarcts within the basal ganglia and right thalamus.   PATIENT SURVEYS:  ABC scale: The Activities-Specific Balance Confidence (ABC) Scale 0% 10 20 30  40 50 60 70 80 90 100% No confidence<->completely confident  How confident are you that you will not lose your balance or become unsteady when you . . .   Date tested ***  Walk around the house ***%  2. Walk up or down stairs ***%  3. Bend over and pick up a slipper from in front of a closet floor ***%  4. Reach for a small can off a shelf at eye level ***%  5. Stand on tip toes and reach for something above your head ***%  6. Stand on a chair  and reach for something ***%  7. Sweep the floor ***%  8. Walk outside the house to a car parked in the driveway ***%  9. Get into or out of a car ***%  10. Walk across a parking lot to the mall ***%  11. Walk up or down a ramp ***%  12. Walk in a  crowded mall where people rapidly walk past you ***%  13. Are bumped into by people as you walk through the mall ***%  14. Step onto or off of an escalator while you are holding onto the railing ***%  15. Step onto or off an escalator while holding onto parcels such that you cannot hold onto the railing ***%  16. Walk outside on icy sidewalks ***%  Total: #/16 ***    LEFS  Extreme difficulty/unable (0), Quite a bit of difficulty (1), Moderate difficulty (2), Little difficulty (3), No difficulty (4) Survey date:    Any of your usual work, housework or school activities   2. Usual hobbies, recreational or sporting activities   3. Getting into/out of the bath   4. Walking between rooms   5. Putting on socks/shoes   6. Squatting    7. Lifting an object, like a bag of groceries from the floor   8. Performing light activities around your home   9. Performing heavy activities around your home   10. Getting into/out of a car   11. Walking 2 blocks   12. Walking 1 mile   13. Going up/down 10 stairs (1 flight)   14. Standing for 1 hour   15.  sitting for 1 hour   16. Running on even ground   17. Running on uneven ground   18. Making sharp turns while running fast   19. Hopping    20. Rolling over in bed   Score total:  ***     COGNITION: Overall cognitive status: {cognition:24006}     SENSATION: {sensation:27233}  EDEMA:  {edema:24020}  MUSCLE LENGTH: Hamstrings: Right *** deg; Left *** deg Debby test: Right *** deg; Left *** deg  POSTURE: {posture:25561}  PALPATION: ***  LOWER EXTREMITY ROM:  {AROM/PROM:27142} ROM Right eval Left eval  Hip flexion    Hip extension    Hip abduction    Hip adduction    Hip internal rotation    Hip external rotation    Knee flexion    Knee extension    Ankle dorsiflexion    Ankle plantarflexion    Ankle inversion    Ankle eversion     (Blank rows = not tested)  LOWER EXTREMITY MMT:  MMT Right eval Left eval  Hip flexion     Hip extension    Hip abduction    Hip adduction    Hip internal rotation    Hip external rotation    Knee flexion    Knee extension    Ankle dorsiflexion    Ankle plantarflexion    Ankle inversion    Ankle eversion     (Blank rows = not tested)  LOWER EXTREMITY SPECIAL TESTS:  {LEspecialtests:26242}  FUNCTIONAL TESTS:  30 seconds chair stand test 2 minute walk test: *** 30 second chair stand: *** GAIT: Distance walked: *** Assistive device utilized: {Assistive devices:23999} Level of assistance: {Levels of assistance:24026} Comments: ***  TREATMENT DATE:  05/30/24: PT eval and HEP    PATIENT EDUCATION:  Education details: PT evaluation, objective findings, POC, Importance of HEP, Precautions, Clinic policies  Person educated: Patient Education method: Explanation and Demonstration Education comprehension: verbalized understanding and returned demonstration  HOME EXERCISE PROGRAM: ***  ASSESSMENT:  CLINICAL IMPRESSION: Patient is a 64 y.o. female who was seen today for physical therapy evaluation and treatment for M62.81 (ICD-10-CM) - Muscle weakness R29.898 (ICD-10-CM) - Muscular deconditioning.   OBJECTIVE IMPAIRMENTS: {opptimpairments:25111}.   ACTIVITY LIMITATIONS: {activitylimitations:27494}  PARTICIPATION LIMITATIONS: {participationrestrictions:25113}  PERSONAL FACTORS: {Personal factors:25162} are also affecting patient's functional outcome.   REHAB POTENTIAL: {rehabpotential:25112}  CLINICAL DECISION MAKING: {clinical decision making:25114}  EVALUATION COMPLEXITY: {Evaluation complexity:25115}   GOALS: Goals reviewed with patient? No  SHORT TERM GOALS: Target date: 06/24/24 Patient will be independent with performance of HEP to demonstrate adequate self management of symptoms.  Baseline:  Goal status: INITIAL  2.    Patient will report at least a 25% improvement with function and/or pain reduction overall since beginning PT. Baseline:  Goal status: INITIAL   LONG TERM GOALS: Target date: 07/15/24 Patient will improve LEFS score by 9 points to demonstrate improved perceived function while meeting MCID.  Baseline: Goal status: INITIAL 2.  Patient will improve    ROM by at least *** degrees to demonstrate improved LE mobility needed for functional transfers and gait mechanics.  Baseline:  Goal status: INITIAL 3.  Patient will score at least a  *** on    LE MMT to demonstrate increased LE strength and/or power needed to improve ambulation/gait mechanics.  Baseline:  Goal status: INITIAL   4.  Patient will increase 2 minute walk test gait distance to *** with least restrictive assistive device to demonstrate improved endurance and functional mobility needed for ambulating household and community distances.  Baseline: Goal status: INITIAL    PLAN:  PT FREQUENCY: 1-2x/week  PT DURATION: 6 weeks  PLANNED INTERVENTIONS: 97164- PT Re-evaluation, 97110-Therapeutic exercises, 97530- Therapeutic activity, [id number]- Neuromuscular re-education, 97535- Self Care, 02859- Manual therapy, [id number]- Gait training, [phone]- Electrical stimulation (manual), [id number]- Ultrasound, 02987- Traction (mechanical), [phone] (1-2 muscles), 20561 (3+ muscles)- Dry Needling, Patient/Family education, Balance training, Stair training, Taping, Joint mobilization, Spinal mobilization, Cryotherapy, and Moist heat  PLAN FOR NEXT SESSION: Review HEP and goals,    3:40 PM, 05/27/2024 Katha Kuehne Powell-Butler, PT, DPT [facility] [place]    Managed Medicaid Authorization Request Treatment Start Date: ***  Visit Dx Codes: ***  Functional Tool Score: ***  For all possible CPT codes, reference the Planned Interventions line above.     Check all conditions that are expected to impact treatment: {Conditions expected to impact  treatment:{Conditions expected to impact treatment:28273}   If treatment provided at initial evaluation, no treatment charged due to lack of authorization.     "

## 2024-05-30 ENCOUNTER — Other Ambulatory Visit: Payer: Self-pay

## 2024-05-30 ENCOUNTER — Ambulatory Visit (HOSPITAL_COMMUNITY): Payer: MEDICAID | Attending: Family Medicine

## 2024-05-30 DIAGNOSIS — M6281 Muscle weakness (generalized): Secondary | ICD-10-CM | POA: Insufficient documentation

## 2024-05-30 DIAGNOSIS — Z7409 Other reduced mobility: Secondary | ICD-10-CM | POA: Diagnosis present

## 2024-05-31 LAB — COLOGUARD: COLOGUARD: NEGATIVE

## 2024-06-01 ENCOUNTER — Ambulatory Visit (HOSPITAL_COMMUNITY): Admitting: Physical Therapy

## 2024-06-07 ENCOUNTER — Ambulatory Visit (HOSPITAL_COMMUNITY): Admitting: Physical Therapy

## 2024-06-10 ENCOUNTER — Ambulatory Visit (HOSPITAL_COMMUNITY)

## 2024-06-13 ENCOUNTER — Ambulatory Visit (HOSPITAL_COMMUNITY)

## 2024-06-16 ENCOUNTER — Telehealth (HOSPITAL_COMMUNITY): Payer: Self-pay

## 2024-06-16 ENCOUNTER — Ambulatory Visit (HOSPITAL_COMMUNITY)

## 2024-06-16 NOTE — Telephone Encounter (Signed)
 Called patient concerning missed appointment this date. No Answer. Left message with reminder of No show/cancellation policy and next scheduled appointment.    5:30 PM, 06/16/24 Rosaria Settler, PT, DPT [facility] [place]

## 2024-06-17 ENCOUNTER — Ambulatory Visit (HOSPITAL_COMMUNITY)

## 2024-06-17 NOTE — Therapy (Signed)
 PHYSICAL THERAPY DISCHARGE SUMMARY  Visits from Start of Care: 1  Current functional level related to goals / functional outcomes: See eval on 05/30/24   Remaining deficits: See eval on 05/30/24   Education / Equipment: HEP   Patient agrees to discharge. Patient goals were not met. Patient is being discharged due to not returning since the last visit. Patient's sister called to report patient is not interested in therapy services at this time. Pt discharged.  10:24 AM, 06/17/24 Rosaria Settler, PT, DPT [facility] [place]

## 2024-06-20 ENCOUNTER — Ambulatory Visit (HOSPITAL_COMMUNITY)

## 2024-06-23 ENCOUNTER — Ambulatory Visit (HOSPITAL_COMMUNITY)

## 2024-06-27 ENCOUNTER — Ambulatory Visit (HOSPITAL_COMMUNITY)

## 2024-06-30 ENCOUNTER — Ambulatory Visit (HOSPITAL_COMMUNITY)

## 2024-07-04 ENCOUNTER — Ambulatory Visit (HOSPITAL_COMMUNITY)

## 2024-07-05 ENCOUNTER — Ambulatory Visit: Admitting: Nutrition

## 2024-07-07 ENCOUNTER — Ambulatory Visit (HOSPITAL_COMMUNITY)

## 2024-07-11 ENCOUNTER — Ambulatory Visit (HOSPITAL_COMMUNITY)

## 2024-07-14 ENCOUNTER — Ambulatory Visit (HOSPITAL_COMMUNITY)

## 2024-07-18 ENCOUNTER — Ambulatory Visit (HOSPITAL_COMMUNITY)

## 2024-07-21 ENCOUNTER — Ambulatory Visit (HOSPITAL_COMMUNITY)
